# Patient Record
Sex: Male | Born: 1935 | Race: White | Hispanic: No | Marital: Married | Smoking: Current every day smoker
Health system: Southern US, Community
[De-identification: ages and names within clinical notes are randomized; demographics above are authoritative.]

## PROBLEM LIST (undated history)

## (undated) DIAGNOSIS — E785 Hyperlipidemia, unspecified: Secondary | ICD-10-CM

## (undated) DIAGNOSIS — E876 Hypokalemia: Secondary | ICD-10-CM

## (undated) DIAGNOSIS — R7302 Impaired glucose tolerance (oral): Secondary | ICD-10-CM

## (undated) DIAGNOSIS — K449 Diaphragmatic hernia without obstruction or gangrene: Secondary | ICD-10-CM

## (undated) DIAGNOSIS — K279 Peptic ulcer, site unspecified, unspecified as acute or chronic, without hemorrhage or perforation: Secondary | ICD-10-CM

## (undated) DIAGNOSIS — I2510 Atherosclerotic heart disease of native coronary artery without angina pectoris: Secondary | ICD-10-CM

## (undated) DIAGNOSIS — I7389 Other specified peripheral vascular diseases: Secondary | ICD-10-CM

## (undated) DIAGNOSIS — J441 Chronic obstructive pulmonary disease with (acute) exacerbation: Secondary | ICD-10-CM

## (undated) DIAGNOSIS — F17200 Nicotine dependence, unspecified, uncomplicated: Secondary | ICD-10-CM

## (undated) DIAGNOSIS — E739 Lactose intolerance, unspecified: Secondary | ICD-10-CM

## (undated) DIAGNOSIS — R5381 Other malaise: Secondary | ICD-10-CM

## (undated) DIAGNOSIS — I701 Atherosclerosis of renal artery: Secondary | ICD-10-CM

## (undated) DIAGNOSIS — H543 Unqualified visual loss, both eyes: Secondary | ICD-10-CM

## (undated) DIAGNOSIS — I219 Acute myocardial infarction, unspecified: Secondary | ICD-10-CM

## (undated) DIAGNOSIS — E538 Deficiency of other specified B group vitamins: Secondary | ICD-10-CM

## (undated) DIAGNOSIS — Z8679 Personal history of other diseases of the circulatory system: Secondary | ICD-10-CM

## (undated) DIAGNOSIS — H9192 Unspecified hearing loss, left ear: Principal | ICD-10-CM

## (undated) DIAGNOSIS — F329 Major depressive disorder, single episode, unspecified: Secondary | ICD-10-CM

## (undated) DIAGNOSIS — K649 Unspecified hemorrhoids: Secondary | ICD-10-CM

## (undated) DIAGNOSIS — Q2730 Arteriovenous malformation, site unspecified: Secondary | ICD-10-CM

## (undated) DIAGNOSIS — I10 Essential (primary) hypertension: Principal | ICD-10-CM

## (undated) DIAGNOSIS — R5383 Other fatigue: Secondary | ICD-10-CM

## (undated) DIAGNOSIS — J449 Chronic obstructive pulmonary disease, unspecified: Secondary | ICD-10-CM

## (undated) DIAGNOSIS — E7800 Pure hypercholesterolemia, unspecified: Secondary | ICD-10-CM

## (undated) DIAGNOSIS — I080 Rheumatic disorders of both mitral and aortic valves: Secondary | ICD-10-CM

## (undated) DIAGNOSIS — R339 Retention of urine, unspecified: Secondary | ICD-10-CM

## (undated) DIAGNOSIS — D509 Iron deficiency anemia, unspecified: Secondary | ICD-10-CM

## (undated) DIAGNOSIS — I739 Peripheral vascular disease, unspecified: Secondary | ICD-10-CM

## (undated) DIAGNOSIS — R079 Chest pain, unspecified: Secondary | ICD-10-CM

## (undated) DIAGNOSIS — K551 Chronic vascular disorders of intestine: Secondary | ICD-10-CM

## (undated) DIAGNOSIS — K219 Gastro-esophageal reflux disease without esophagitis: Secondary | ICD-10-CM

## (undated) DIAGNOSIS — I4892 Unspecified atrial flutter: Secondary | ICD-10-CM

## (undated) DIAGNOSIS — K5730 Diverticulosis of large intestine without perforation or abscess without bleeding: Secondary | ICD-10-CM

## (undated) DIAGNOSIS — M25539 Pain in unspecified wrist: Secondary | ICD-10-CM

## (undated) DIAGNOSIS — Z86010 Personal history of colonic polyps: Secondary | ICD-10-CM

## (undated) HISTORY — DX: Other fatigue: R53.83

## (undated) HISTORY — DX: Impaired glucose tolerance (oral): R73.02

## (undated) HISTORY — DX: Unspecified atrial flutter: I48.92

## (undated) HISTORY — DX: Chronic obstructive pulmonary disease, unspecified: J44.9

## (undated) HISTORY — DX: Peripheral vascular disease, unspecified: I73.9

## (undated) HISTORY — DX: Atherosclerosis of renal artery: I70.1

## (undated) HISTORY — PX: PR VEIN BYPASS GRAFT,AORTO-FEM-POP: 35551

## (undated) HISTORY — DX: Nicotine dependence, unspecified, uncomplicated: F17.200

## (undated) HISTORY — DX: Acute myocardial infarction, unspecified: I21.9

## (undated) HISTORY — DX: Essential (primary) hypertension: I10

## (undated) HISTORY — DX: Gastro-esophageal reflux disease without esophagitis: K21.9

## (undated) HISTORY — DX: Atherosclerotic heart disease of native coronary artery without angina pectoris: I25.10

## (undated) HISTORY — DX: Rheumatic disorders of both mitral and aortic valves: I08.0

## (undated) HISTORY — DX: Deficiency of other specified B group vitamins: E53.8

## (undated) HISTORY — PX: ESOPHAGOGASTRODUODENOSCOPY: SHX1529

## (undated) HISTORY — DX: Other specified peripheral vascular diseases: I73.89

## (undated) HISTORY — DX: Pure hypercholesterolemia, unspecified: E78.00

## (undated) HISTORY — DX: Unqualified visual loss, both eyes: H54.3

## (undated) HISTORY — DX: Hypokalemia: E87.6

## (undated) HISTORY — DX: Hyperlipidemia, unspecified: E78.5

## (undated) HISTORY — DX: Unspecified hemorrhoids: K64.9

## (undated) HISTORY — PX: COLONOSCOPY: SHX174

## (undated) HISTORY — DX: Unspecified hearing loss, left ear: H91.92

## (undated) HISTORY — DX: Major depressive disorder, single episode, unspecified: F32.9

## (undated) HISTORY — DX: Chronic obstructive pulmonary disease with (acute) exacerbation: J44.1

## (undated) HISTORY — PX: OTHER SURGICAL HISTORY: SHX169

## (undated) HISTORY — DX: Chronic vascular disorders of intestine: K55.1

## (undated) HISTORY — DX: Personal history of other diseases of the circulatory system: Z86.79

## (undated) HISTORY — DX: Pain in unspecified wrist: M25.539

## (undated) HISTORY — DX: Chest pain, unspecified: R07.9

## (undated) HISTORY — DX: Diverticulosis of large intestine without perforation or abscess without bleeding: K57.30

## (undated) HISTORY — PX: CARDIAC CATHETERIZATION: SHX172

## (undated) HISTORY — PX: CAROTID ENDARTERECTOMY: SUR193

## (undated) HISTORY — DX: Other malaise: R53.81

## (undated) HISTORY — DX: Personal history of colonic polyps: Z86.010

## (undated) HISTORY — DX: Peptic ulcer, site unspecified, unspecified as acute or chronic, without hemorrhage or perforation: K27.9

## (undated) HISTORY — DX: Iron deficiency anemia, unspecified: D50.9

## (undated) HISTORY — DX: Diaphragmatic hernia without obstruction or gangrene: K44.9

## (undated) HISTORY — PX: APPENDECTOMY: SHX54

## (undated) HISTORY — DX: Lactose intolerance, unspecified: E73.9

## (undated) HISTORY — DX: Retention of urine, unspecified: R33.9

---

## 1993-08-14 HISTORY — PX: CORONARY ARTERY BYPASS GRAFT: SHX141

## 1999-06-16 ENCOUNTER — Encounter: Payer: Self-pay | Admitting: *Deleted

## 1999-06-16 ENCOUNTER — Inpatient Hospital Stay (HOSPITAL_COMMUNITY): Admission: EM | Admit: 1999-06-16 | Discharge: 1999-06-23 | Payer: Self-pay | Admitting: *Deleted

## 1999-06-16 ENCOUNTER — Encounter: Payer: Self-pay | Admitting: Vascular Surgery

## 1999-06-18 ENCOUNTER — Encounter: Payer: Self-pay | Admitting: *Deleted

## 1999-06-19 ENCOUNTER — Encounter: Payer: Self-pay | Admitting: Vascular Surgery

## 1999-06-20 ENCOUNTER — Encounter: Payer: Self-pay | Admitting: *Deleted

## 2000-10-28 ENCOUNTER — Emergency Department (HOSPITAL_COMMUNITY): Admission: EM | Admit: 2000-10-28 | Discharge: 2000-10-28 | Payer: Self-pay | Admitting: Emergency Medicine

## 2002-08-14 HISTORY — PX: OTHER SURGICAL HISTORY: SHX169

## 2003-02-10 ENCOUNTER — Inpatient Hospital Stay (HOSPITAL_COMMUNITY): Admission: EM | Admit: 2003-02-10 | Discharge: 2003-02-18 | Payer: Self-pay | Admitting: Emergency Medicine

## 2003-02-10 ENCOUNTER — Encounter: Payer: Self-pay | Admitting: Emergency Medicine

## 2003-02-11 ENCOUNTER — Encounter: Payer: Self-pay | Admitting: Cardiovascular Disease

## 2003-02-25 ENCOUNTER — Inpatient Hospital Stay (HOSPITAL_COMMUNITY): Admission: AD | Admit: 2003-02-25 | Discharge: 2003-02-27 | Payer: Self-pay | Admitting: *Deleted

## 2003-03-23 ENCOUNTER — Inpatient Hospital Stay (HOSPITAL_COMMUNITY): Admission: AD | Admit: 2003-03-23 | Discharge: 2003-03-26 | Payer: Self-pay | Admitting: *Deleted

## 2003-04-04 ENCOUNTER — Emergency Department (HOSPITAL_COMMUNITY): Admission: EM | Admit: 2003-04-04 | Discharge: 2003-04-04 | Payer: Self-pay | Admitting: Emergency Medicine

## 2003-06-01 ENCOUNTER — Inpatient Hospital Stay (HOSPITAL_COMMUNITY): Admission: AD | Admit: 2003-06-01 | Discharge: 2003-06-02 | Payer: Self-pay | Admitting: *Deleted

## 2003-06-01 ENCOUNTER — Encounter: Payer: Self-pay | Admitting: *Deleted

## 2003-07-14 ENCOUNTER — Inpatient Hospital Stay (HOSPITAL_COMMUNITY): Admission: RE | Admit: 2003-07-14 | Discharge: 2003-07-15 | Payer: Self-pay | Admitting: *Deleted

## 2003-07-14 ENCOUNTER — Encounter (INDEPENDENT_AMBULATORY_CARE_PROVIDER_SITE_OTHER): Payer: Self-pay | Admitting: Specialist

## 2003-07-20 ENCOUNTER — Inpatient Hospital Stay (HOSPITAL_COMMUNITY): Admission: EM | Admit: 2003-07-20 | Discharge: 2003-07-22 | Payer: Self-pay | Admitting: Emergency Medicine

## 2004-02-08 ENCOUNTER — Emergency Department (HOSPITAL_COMMUNITY): Admission: EM | Admit: 2004-02-08 | Discharge: 2004-02-08 | Payer: Self-pay | Admitting: Emergency Medicine

## 2004-05-24 ENCOUNTER — Inpatient Hospital Stay (HOSPITAL_COMMUNITY): Admission: RE | Admit: 2004-05-24 | Discharge: 2004-05-25 | Payer: Self-pay | Admitting: *Deleted

## 2004-05-24 ENCOUNTER — Encounter (INDEPENDENT_AMBULATORY_CARE_PROVIDER_SITE_OTHER): Payer: Self-pay | Admitting: *Deleted

## 2004-05-26 ENCOUNTER — Inpatient Hospital Stay (HOSPITAL_COMMUNITY): Admission: EM | Admit: 2004-05-26 | Discharge: 2004-05-28 | Payer: Self-pay | Admitting: Emergency Medicine

## 2004-10-19 ENCOUNTER — Ambulatory Visit: Payer: Self-pay | Admitting: Internal Medicine

## 2004-11-03 IMAGING — CR DG CHEST 1V PORT
1 series · 1 of 1 positions shown · non-contrast
Comparison: 07/20/03.

CLINICAL DATA: Chest pain. 
 PORTABLE SINGLE VIEW CHEST ([DATE] HOURS)

[view not recorded]
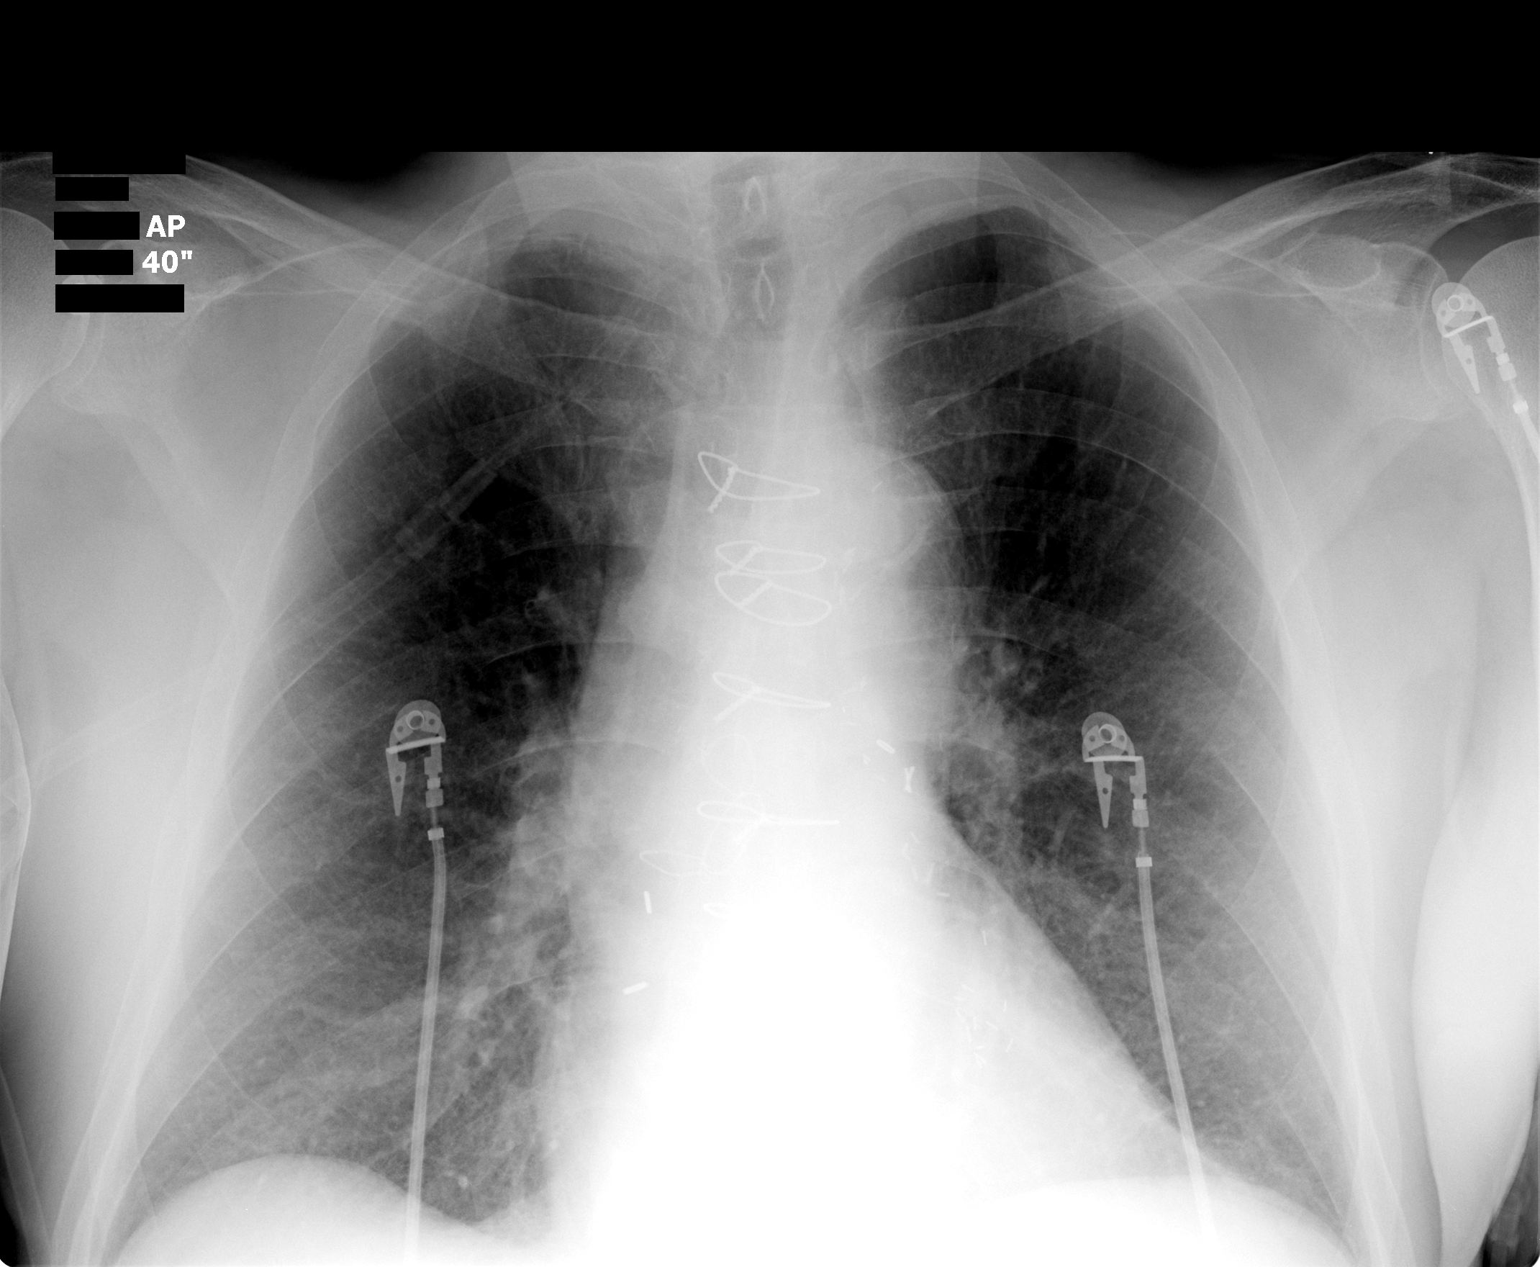

[1 of 1 positions shown; findings below may reference images not displayed]

No evidence of edema or infiltrate.  Normal heart size.  Patient has had prior CABG.
 IMPRESSION
 No active disease.

## 2005-02-13 IMAGING — CR DG CHEST 2V
2 series · 2 of 2 positions shown · non-contrast
Comparison: none

CLINICAL DATA: Carotid stenosis, pre-op. 
 TWO VIEW CHEST ? 05/20/04 ([DATE] hours)

[view not recorded (1 of 2)]
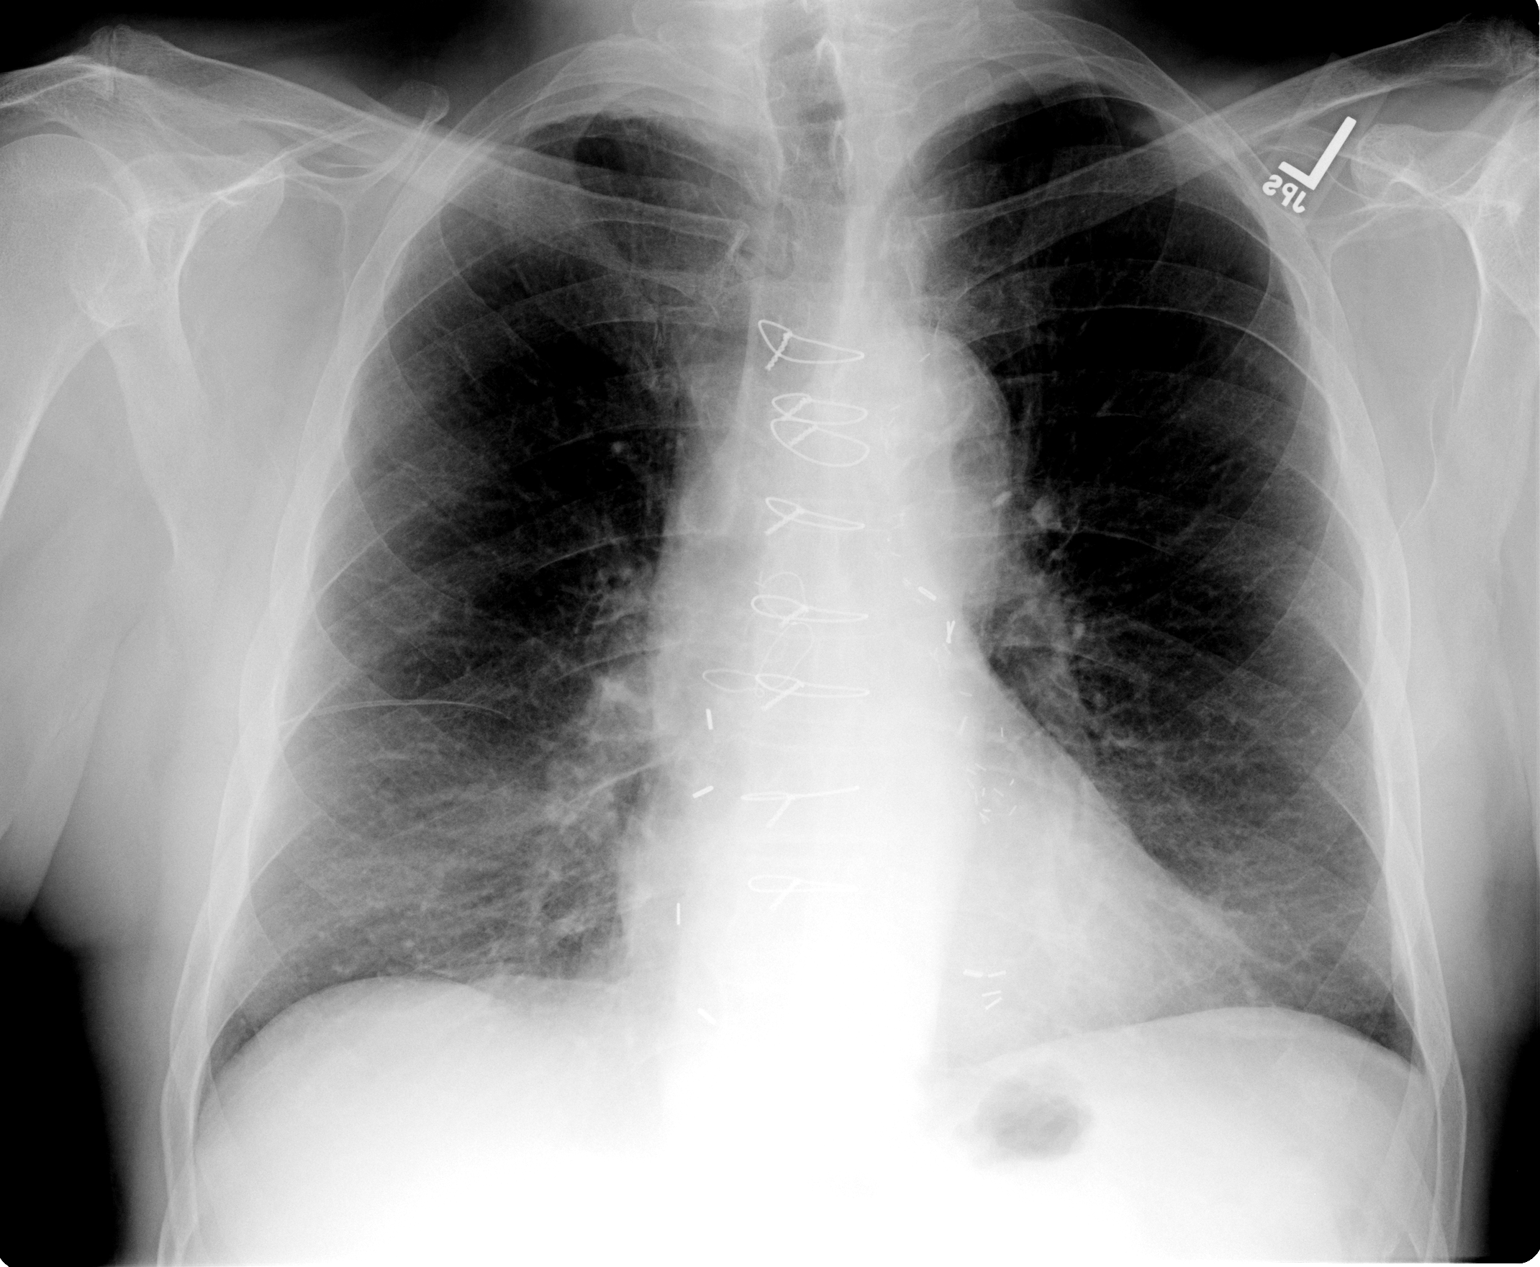

[view not recorded (2 of 2)]
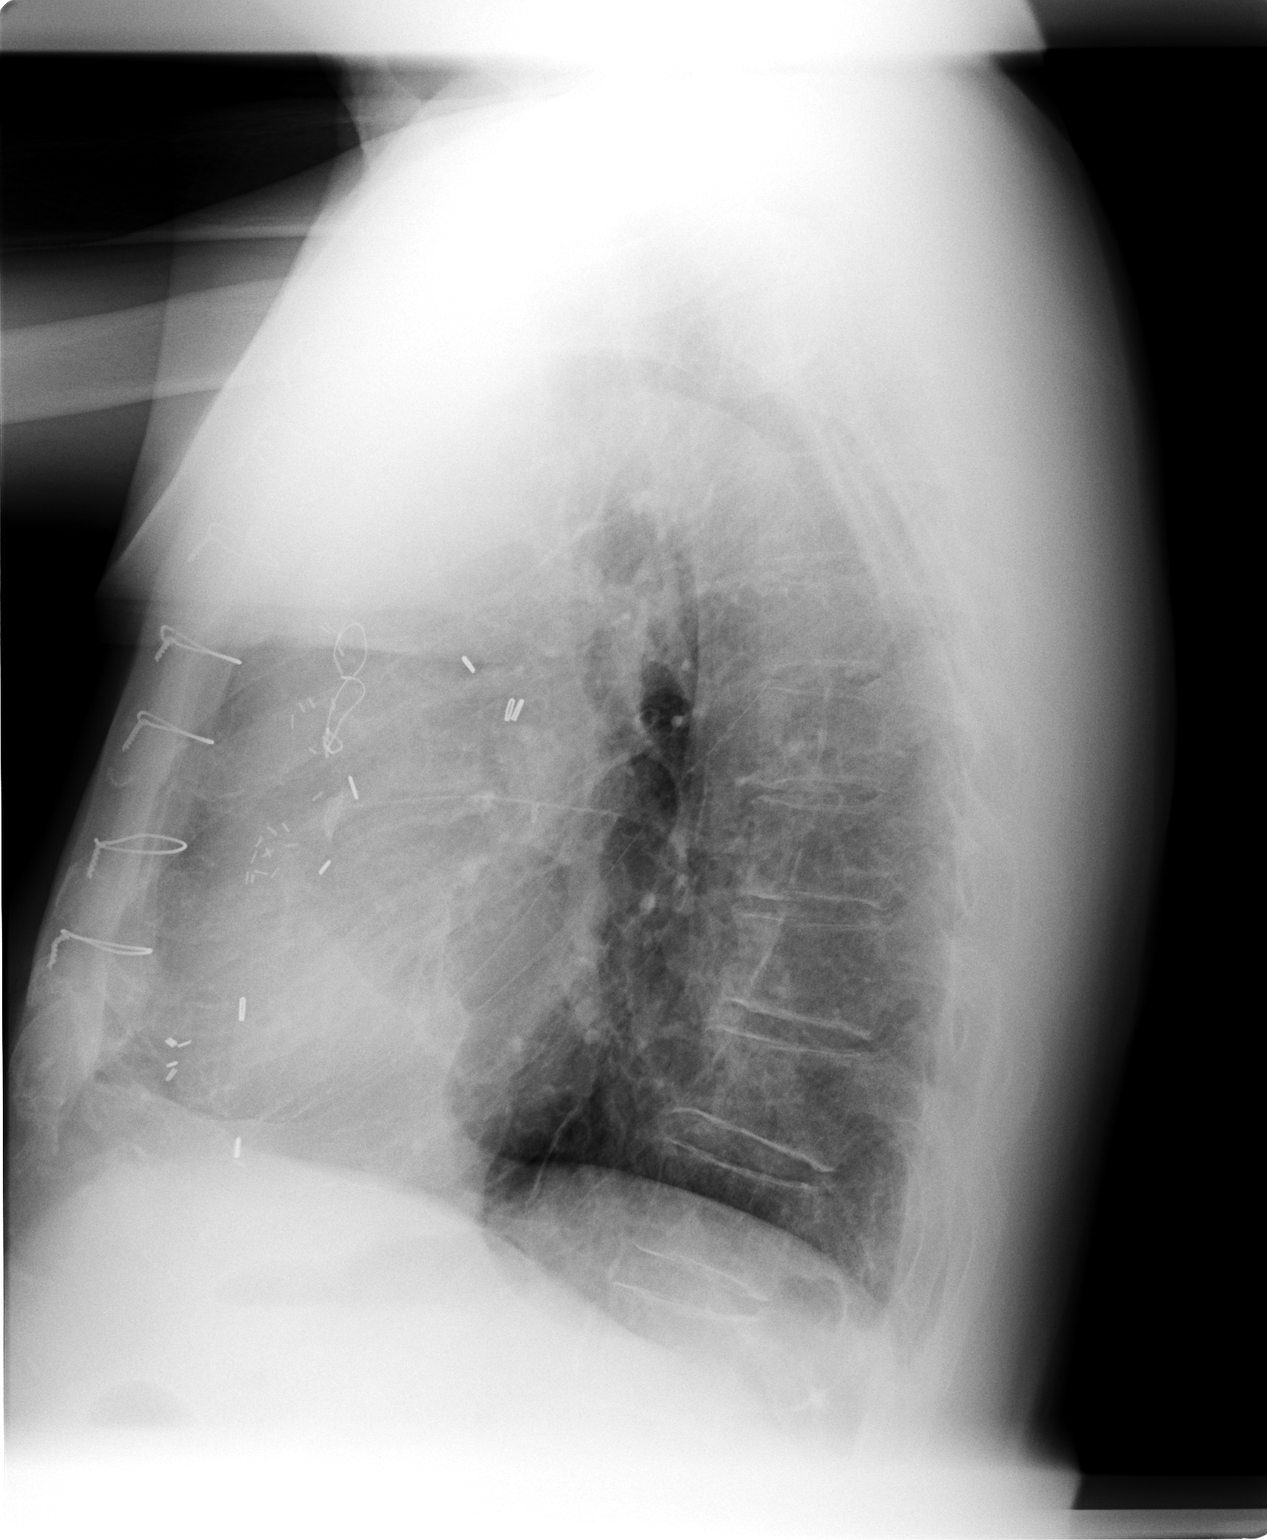

[2 of 2 positions shown; findings below may reference images not displayed]

FINDINGS: The heart is upper normal in size. The aortic arch is mildly tortuous.  Interstitial prominence is likely chronic in nature. Mild anterior wedging of estimated level T11 is noted, most likely compression fracture of indeterminant age.  Post-surgical changes are noted.     No focal masses or areas of consolidation are seen. Apical pleural thickening is symmetrical bilaterally and once again likely chronic in nature. 
 IMPRESSION 
 Mild anterior wedging of a lower thoracic vertebral body, compatible with compression of indeterminant age. 
 Chronic pulmonary and pleural changes. 
 Otherwise, no evidence of acute cardiopulmonary disease.

## 2005-02-19 IMAGING — CR DG CHEST 1V PORT
1 series · 1 of 1 positions shown · non-contrast
Comparison: none

HISTORY: Chest pain

[view not recorded]
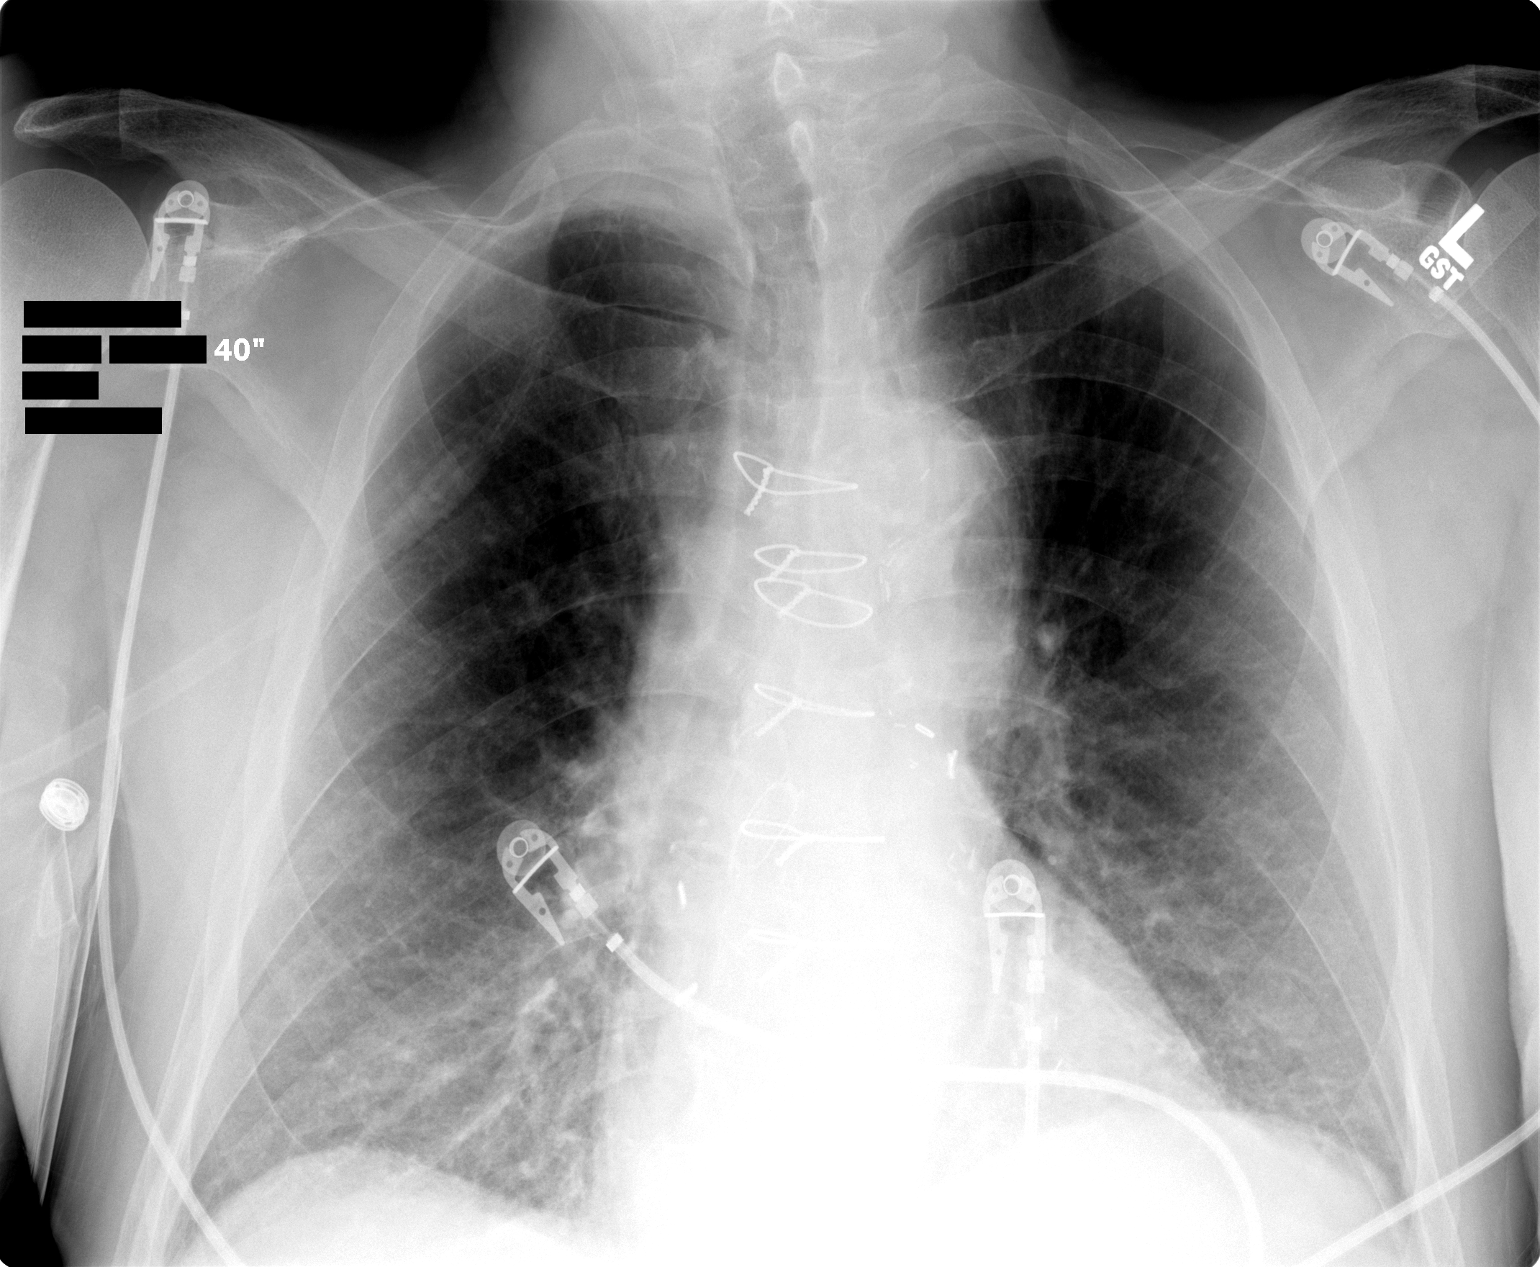

[1 of 1 positions shown; findings below may reference images not displayed]

PORTABLE CHEST ONE VIEW

Portable exam 5675 hours compared to 05/20/2004.

Normal heart size status post CABG.
Calcified tortuous aorta.
Vascularity normal.
Mild chronic bronchitic changes with mild diffuse interstitial prominence, also likely chronic.
Minimal left base atelectasis.
No acute failure or consolidation.
IMPRESSION: Mild chronic bronchitic and interstitial changes. 
Minimal left base atelectasis.

## 2005-10-16 ENCOUNTER — Inpatient Hospital Stay (HOSPITAL_COMMUNITY): Admission: EM | Admit: 2005-10-16 | Discharge: 2005-10-20 | Payer: Self-pay | Admitting: Emergency Medicine

## 2005-10-16 ENCOUNTER — Ambulatory Visit: Payer: Self-pay | Admitting: Internal Medicine

## 2005-10-16 ENCOUNTER — Ambulatory Visit: Payer: Self-pay | Admitting: Pulmonary Disease

## 2005-10-16 ENCOUNTER — Ambulatory Visit: Payer: Self-pay | Admitting: Cardiology

## 2005-10-23 ENCOUNTER — Inpatient Hospital Stay (HOSPITAL_COMMUNITY): Admission: RE | Admit: 2005-10-23 | Discharge: 2005-10-25 | Payer: Self-pay | Admitting: *Deleted

## 2005-11-01 ENCOUNTER — Inpatient Hospital Stay (HOSPITAL_COMMUNITY): Admission: EM | Admit: 2005-11-01 | Discharge: 2005-11-02 | Payer: Self-pay | Admitting: Emergency Medicine

## 2005-11-01 ENCOUNTER — Ambulatory Visit: Payer: Self-pay | Admitting: Cardiology

## 2005-12-26 ENCOUNTER — Ambulatory Visit: Payer: Self-pay | Admitting: Internal Medicine

## 2006-07-12 IMAGING — CR DG CHEST 1V PORT SAME DAY
1 series · 1 of 1 positions shown · non-contrast
Comparison: 10/16/05.

CLINICAL DATA: Hypotension.  Central line placement. 
 PORTABLE CHEST ? 1 VIEW (SAME DAY) ? 10/16/05 AT 6426 HOURS:

[view not recorded]
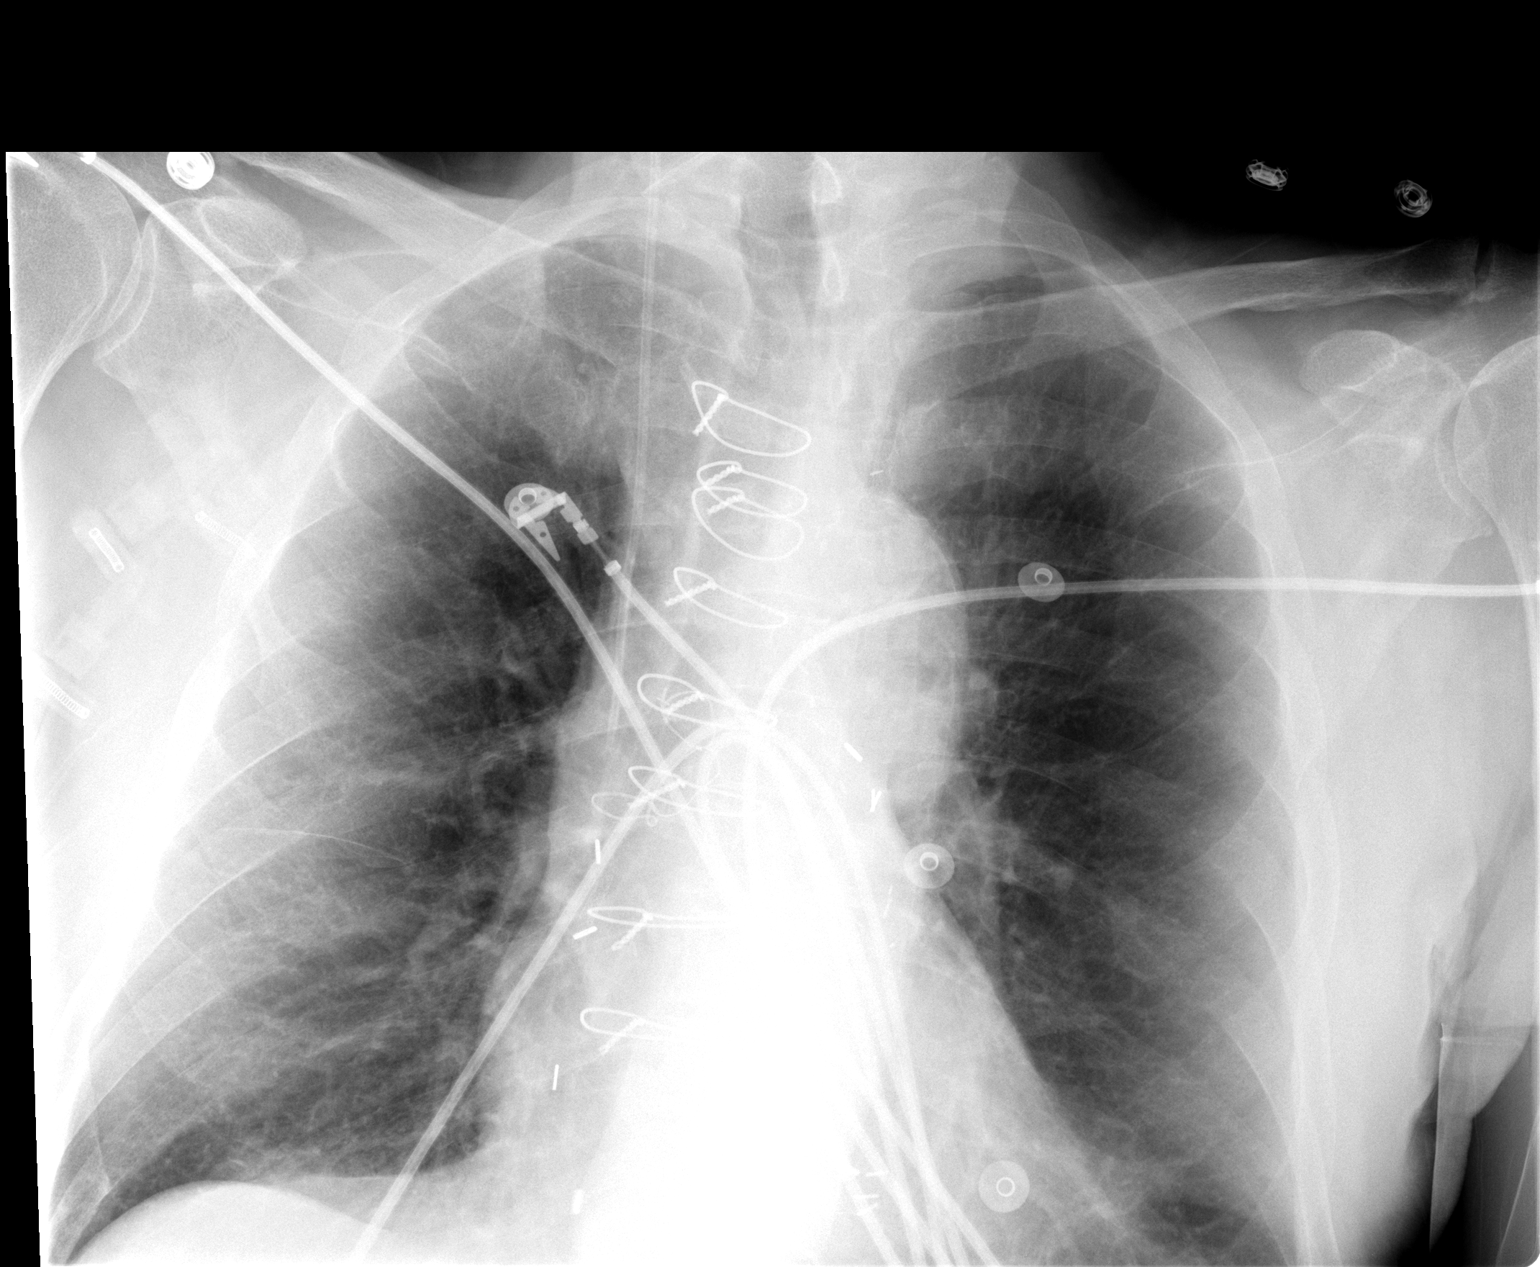

[1 of 1 positions shown; findings below may reference images not displayed]

FINDINGS: CVC has been placed inserted via right jugular vein approach.  The tip is in the SVC.  No pneumothorax.  Lungs are hyperaerated and clear.  Extreme left lung base is excluded from the film.
IMPRESSION: Specifically, CVC tip is in the SVC.  No pneumothorax.

## 2006-07-12 IMAGING — CR DG CHEST 1V PORT
1 series · 1 of 1 positions shown · non-contrast
Comparison: none

CLINICAL DATA: Flu symptoms for one week.  
 PORTABLE CHEST - 1 VIEW:
 An AP semierect portable film of the chest made 10/16/2005 at 9066 hours is compared to a previous study of 05/26/2004 at 8158 hours and shows some interval improvement in overall aeration of the lungs.  There remains diffuse peribronchial thickening throughout both lungs.  There have been previous coronary artery bypass grafts.  No definite edema, effusion, or pneumothorax is seen.  There is a vague area of increased density associated with the region of the left lingula that could represent an area of atelectasis or early infiltrate.  
 The bony thorax shows no definite metastatic disease but there are wire sutures in the sternum.

[view not recorded]
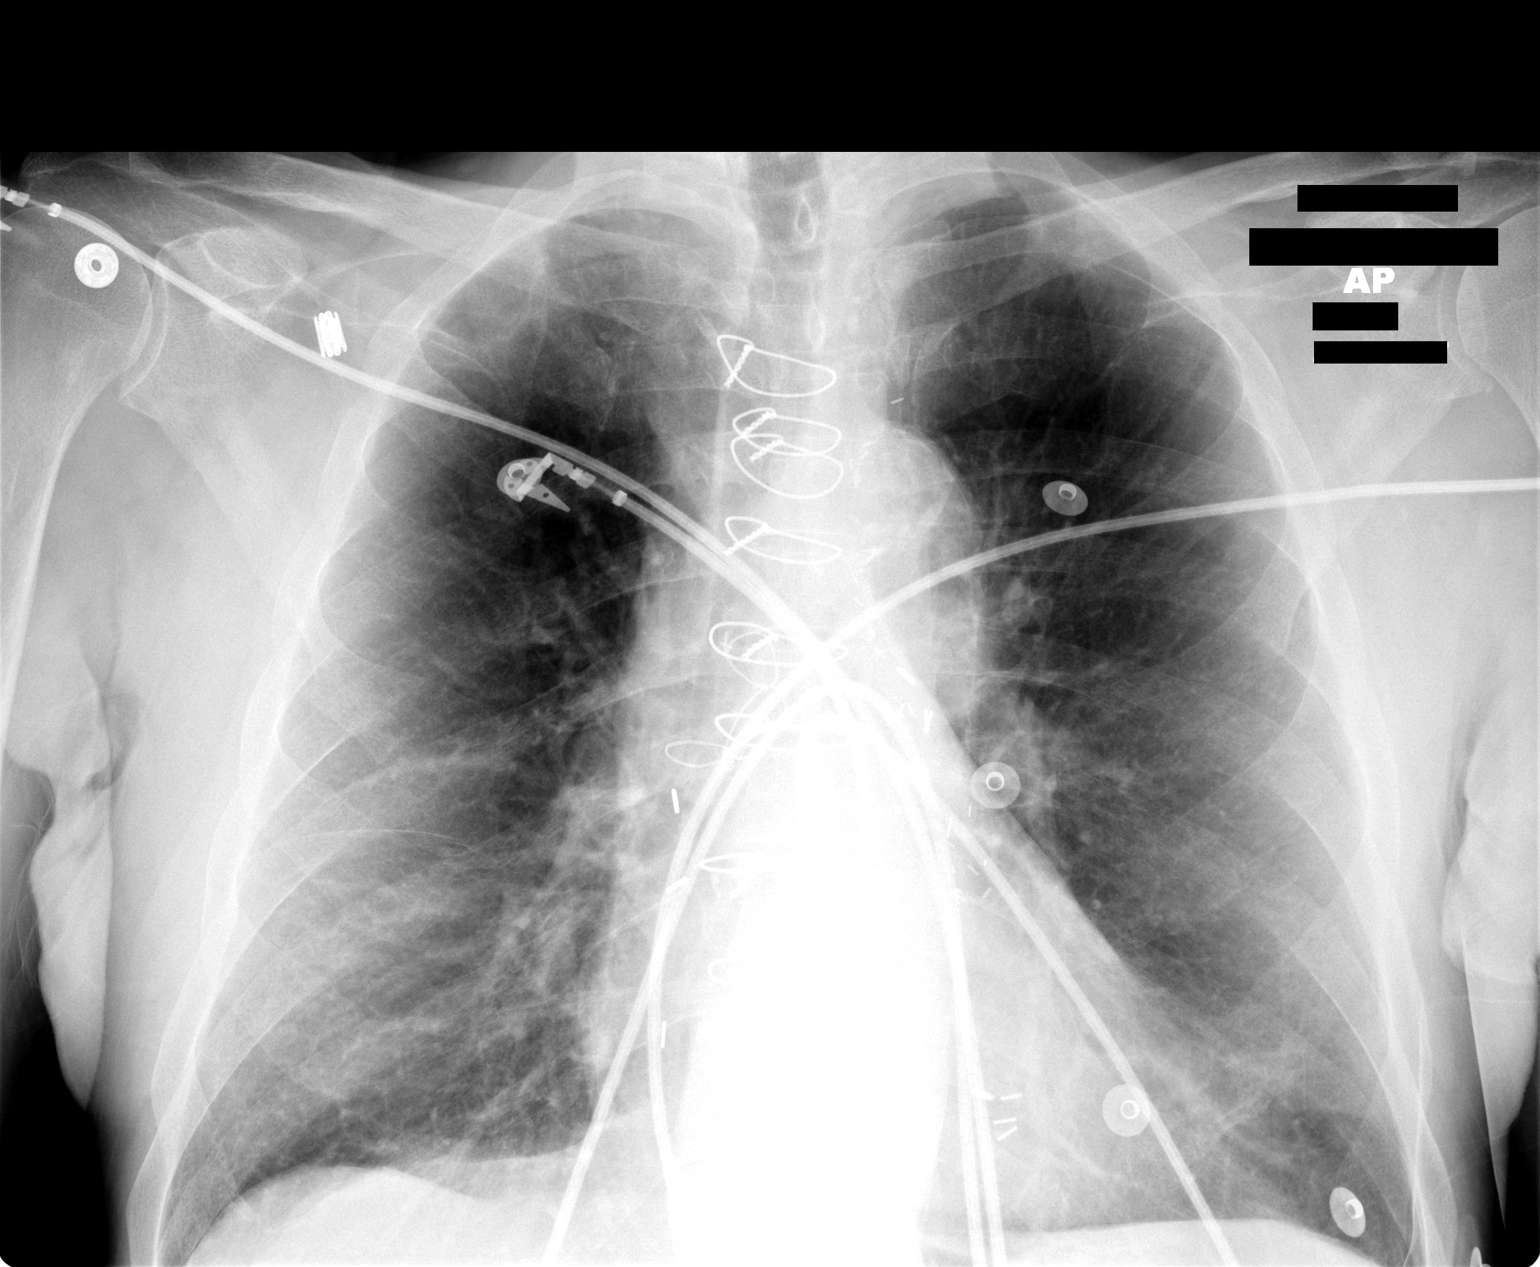

[1 of 1 positions shown; findings below may reference images not displayed]

IMPRESSION: Diffuse peribronchial thickening with an area of vague increased density in the region of the left lingula which could be an area of atelectasis or early infiltrate without consolidation.  No definite cardiomegaly or edema.

## 2006-07-13 IMAGING — US US RENAL
1 series · 14 of 25 positions shown · non-contrast
Comparison: none

CLINICAL DATA: Elevated BUN and creatinine.  Bilateral renal arterial stents.  Hypertension.
 RENAL ULTRASOUND:
TECHNIQUE: Complete ultrasound examination of the urinary tract was performed including evaluation of the kidneys, renal collecting systems, and urinary bladder.
 Both kidneys measure 12cm in length.  No mass or hydronephrosis.  Slightly diffuse increase in renal parenchymal echogenicity probably mainly cortical in location as the medullary pyramids stand out as relatively hypoechoic structures.  
 Bladder not optimally evaluated because of presence of Foley catheter.  Sludge in the gallbladder.

[Series 1: unknown · 0.30mm/px · 14 of 35 slices shown]
[im 1/35]
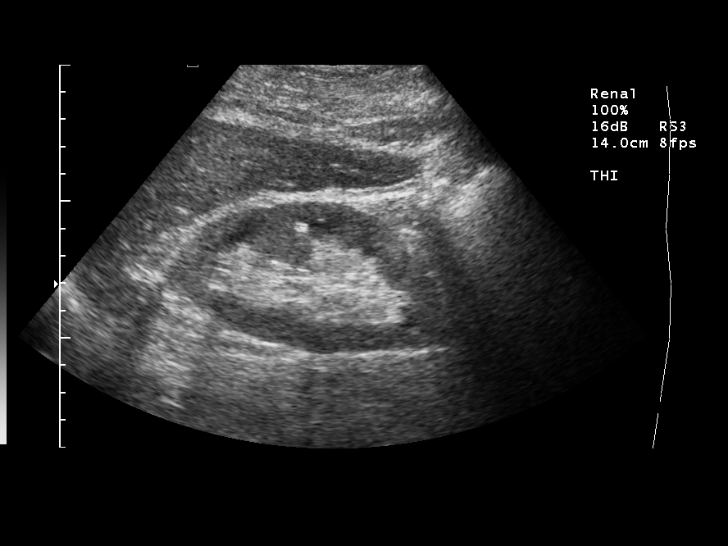
[im 3/35]
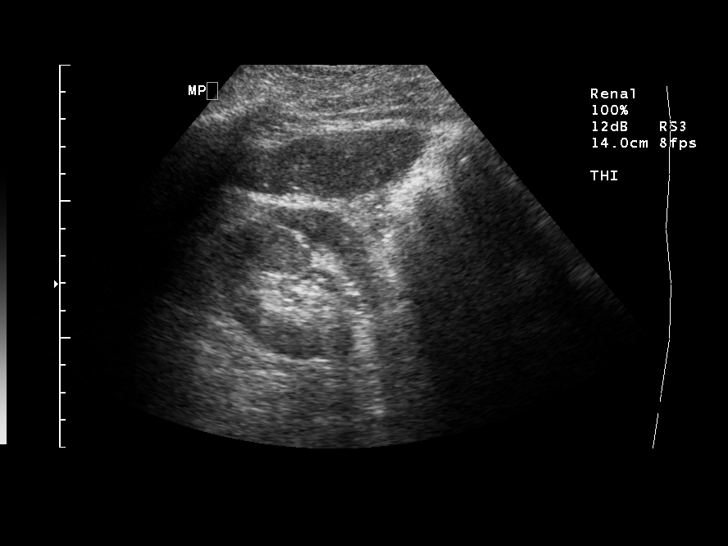
[im 6/35]
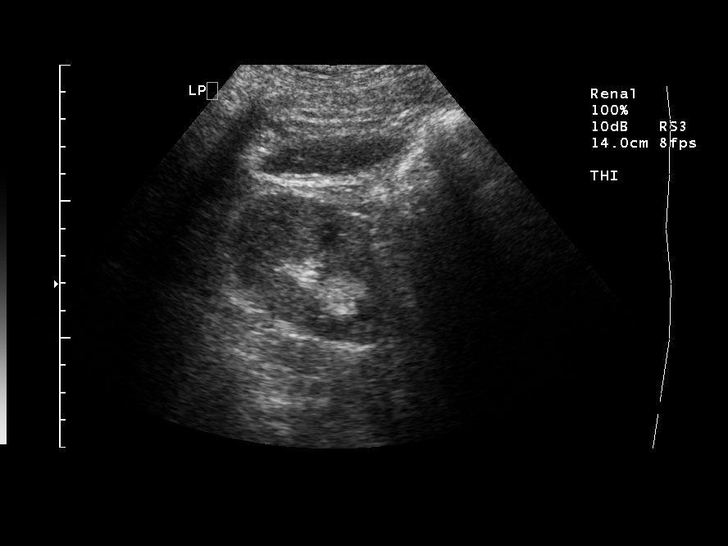
[im 9/35]
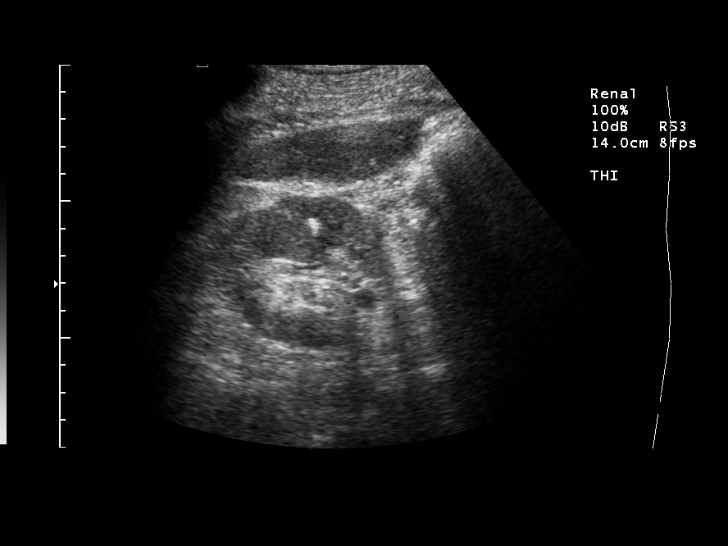
[im 12/35]
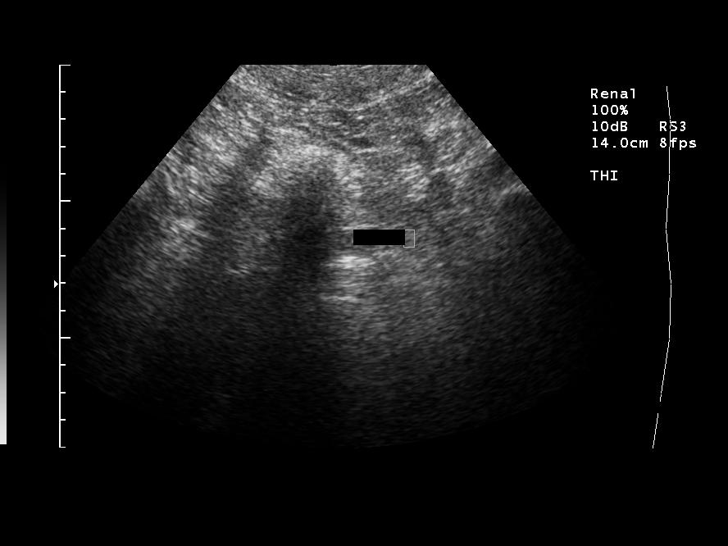
[im 13/35]
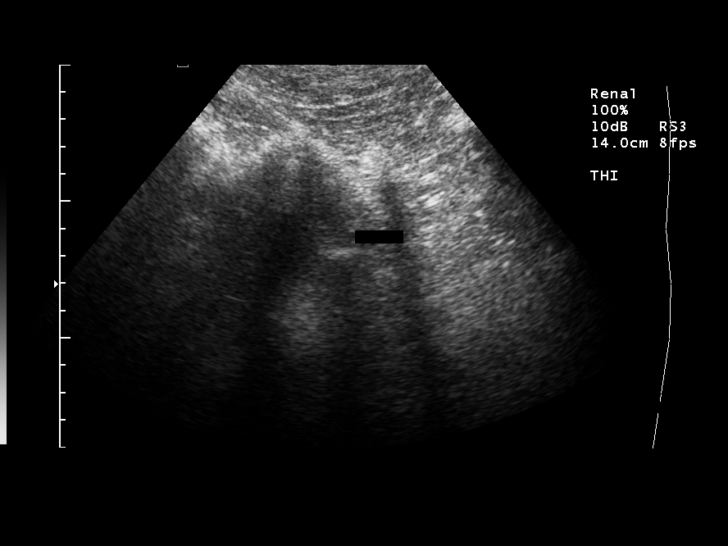
[im 16/35]
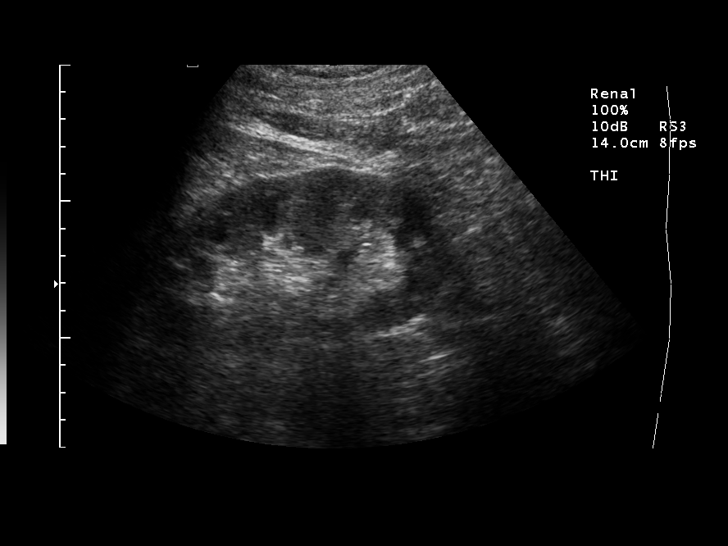
[im 19/35]
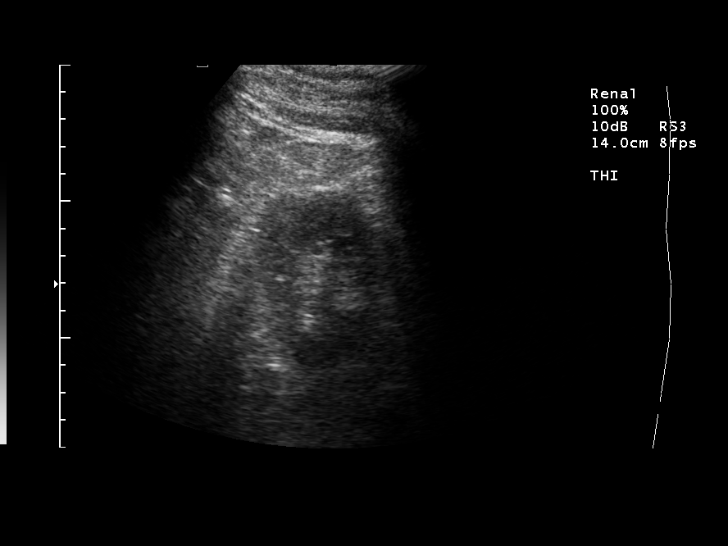
[im 22/35]
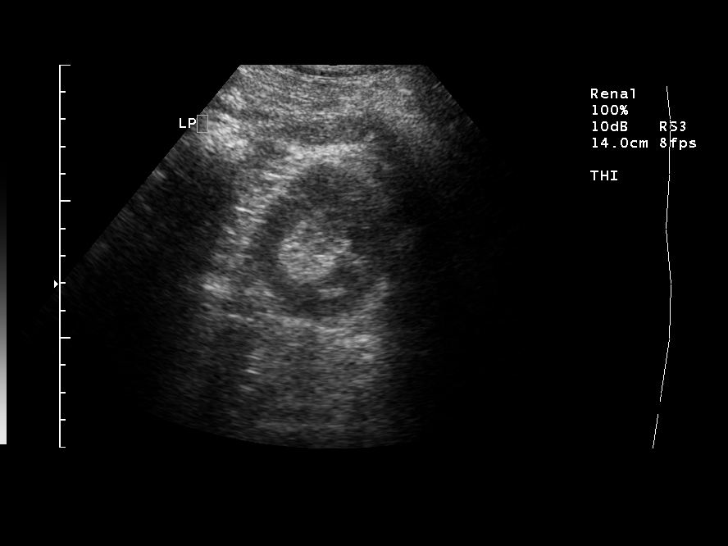
[im 23/35]
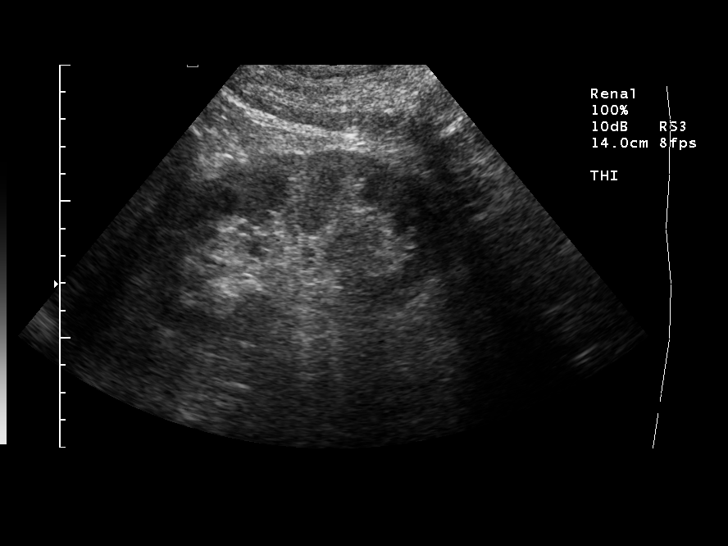
[im 26/35]
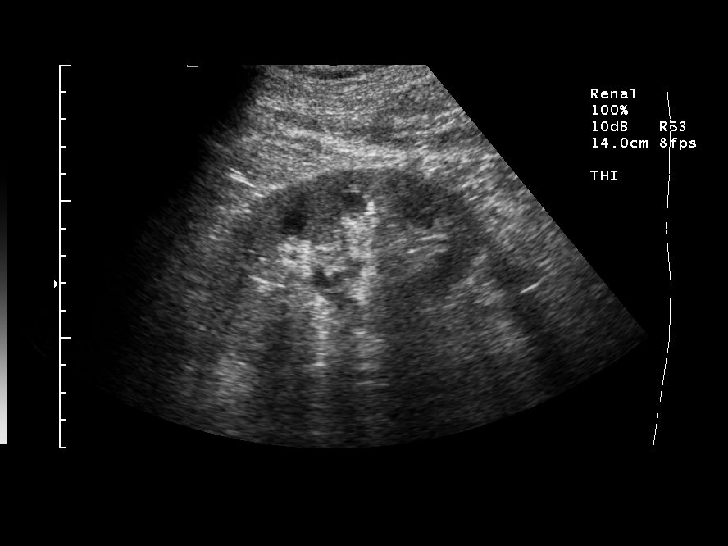
[im 29/35]
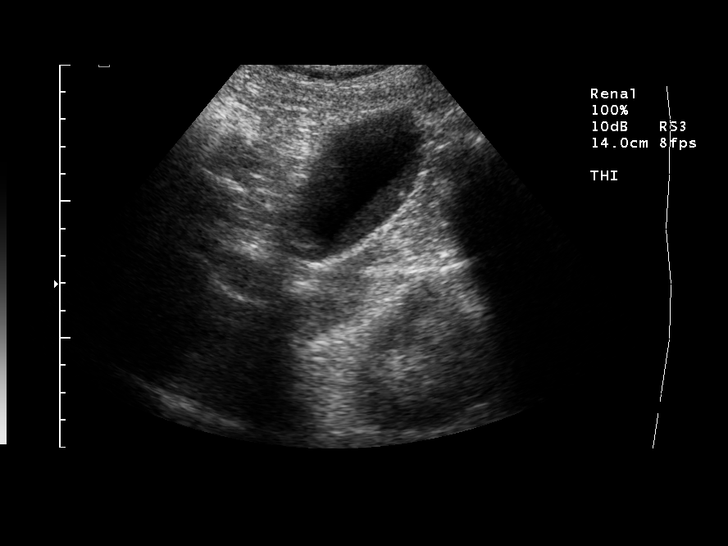
[im 32/35]
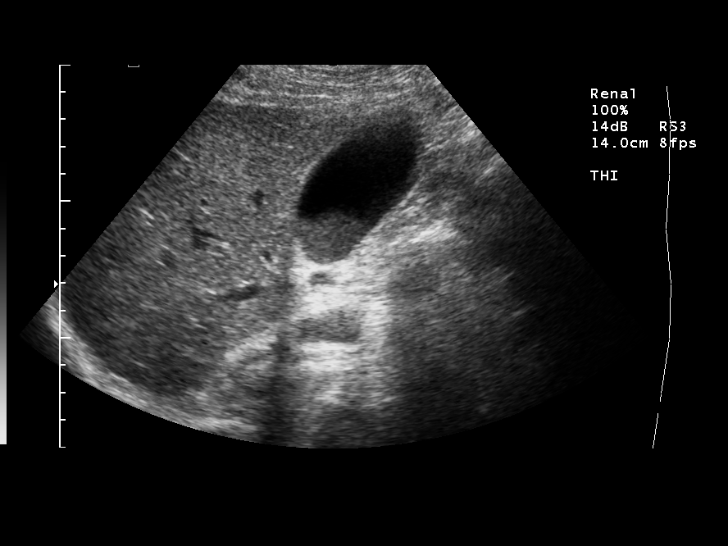
[im 35/35]
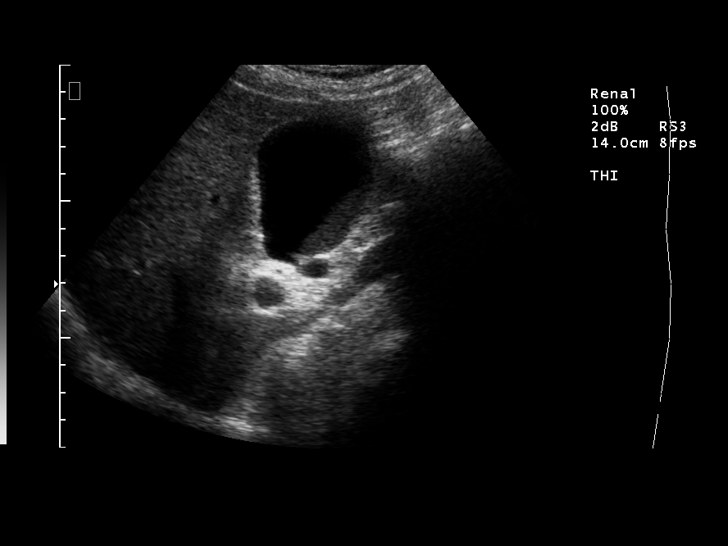

[14 of 25 positions shown; findings below may reference images not displayed]

IMPRESSION: Findings compatible with nonspecific renal medical disease.  No hydronephrosis.

## 2006-07-28 IMAGING — CR DG CHEST 1V PORT
1 series · 1 of 1 positions shown · non-contrast
Comparison: 10/23/2005.

CLINICAL DATA: Chest pain. Hypertension, smoking history.  
 PORTABLE CHEST - 1 VIEW AT 5964 HOURS:

[view not recorded]
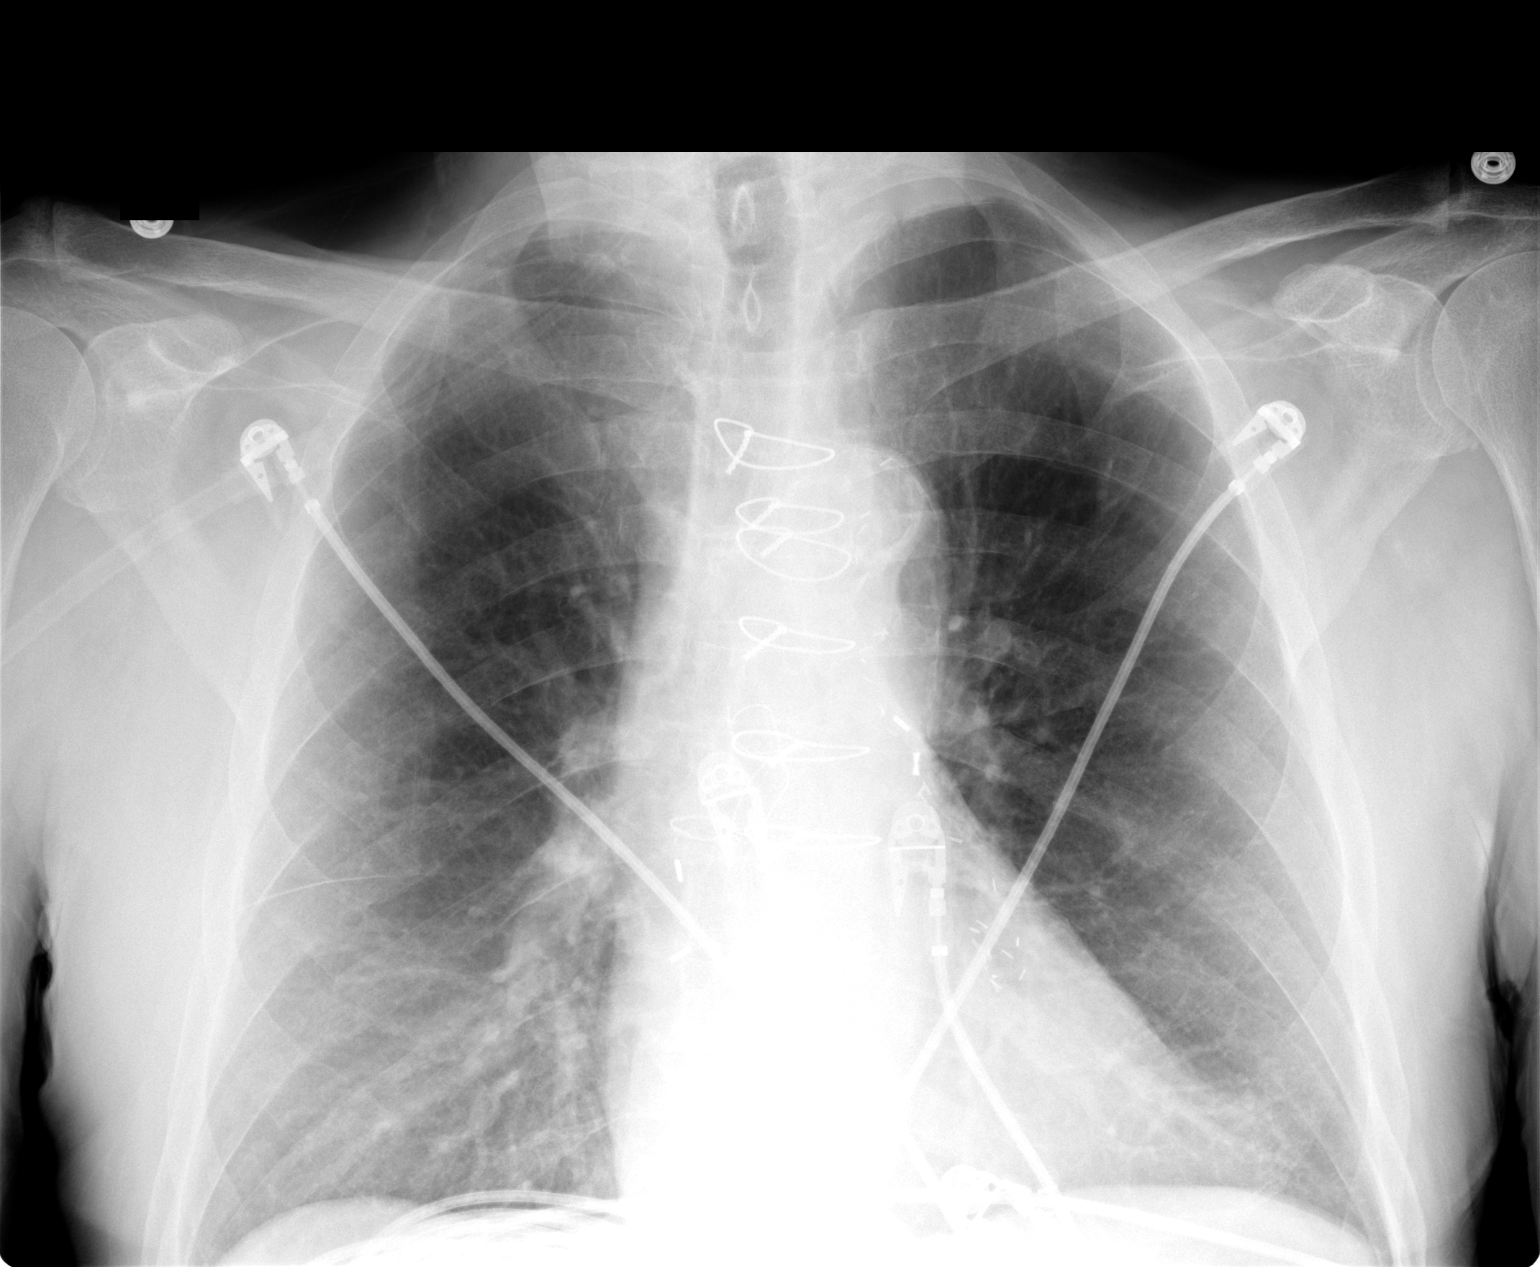

[1 of 1 positions shown; findings below may reference images not displayed]

The patient has had previous median sternotomy and coronary artery bypass grafting.  Artifact overlies the chest.  There may be mild venous hypertension but there is no frank edema.  No effusions.  No focal pulmonary lesions.
IMPRESSION: Question pulmonary venous hypertension, but no frank edema.

## 2007-02-27 ENCOUNTER — Ambulatory Visit: Payer: Self-pay | Admitting: Internal Medicine

## 2007-02-27 LAB — CONVERTED CEMR LAB
AST: 19 units/L (ref 0–37)
Basophils Relative: 1 % (ref 0.0–1.0)
Bilirubin, Direct: 0.1 mg/dL (ref 0.0–0.3)
CO2: 31 meq/L (ref 19–32)
Chloride: 109 meq/L (ref 96–112)
Eosinophils Absolute: 0.6 10*3/uL (ref 0.0–0.6)
Eosinophils Relative: 7.2 % — ABNORMAL HIGH (ref 0.0–5.0)
GFR calc non Af Amer: 58 mL/min
Glucose, Bld: 94 mg/dL (ref 70–99)
HCT: 33.4 % — ABNORMAL LOW (ref 39.0–52.0)
Hgb A1c MFr Bld: 6.3 % — ABNORMAL HIGH (ref 4.6–6.0)
Lymphocytes Relative: 30.3 % (ref 12.0–46.0)
MCV: 90.2 fL (ref 78.0–100.0)
Neutrophils Relative %: 53.1 % (ref 43.0–77.0)
RBC: 3.71 M/uL — ABNORMAL LOW (ref 4.22–5.81)
Sodium: 145 meq/L (ref 135–145)
Total Bilirubin: 0.7 mg/dL (ref 0.3–1.2)
Total CHOL/HDL Ratio: 2.6
Total Protein: 6.6 g/dL (ref 6.0–8.3)
WBC: 7.9 10*3/uL (ref 4.5–10.5)

## 2007-03-02 DIAGNOSIS — Z8679 Personal history of other diseases of the circulatory system: Secondary | ICD-10-CM | POA: Insufficient documentation

## 2007-03-02 DIAGNOSIS — I2510 Atherosclerotic heart disease of native coronary artery without angina pectoris: Secondary | ICD-10-CM | POA: Insufficient documentation

## 2007-03-02 DIAGNOSIS — J4489 Other specified chronic obstructive pulmonary disease: Secondary | ICD-10-CM | POA: Insufficient documentation

## 2007-03-02 DIAGNOSIS — K219 Gastro-esophageal reflux disease without esophagitis: Secondary | ICD-10-CM

## 2007-03-02 DIAGNOSIS — J449 Chronic obstructive pulmonary disease, unspecified: Secondary | ICD-10-CM

## 2007-03-02 DIAGNOSIS — I7389 Other specified peripheral vascular diseases: Secondary | ICD-10-CM | POA: Insufficient documentation

## 2007-03-02 DIAGNOSIS — I701 Atherosclerosis of renal artery: Secondary | ICD-10-CM | POA: Insufficient documentation

## 2007-03-02 HISTORY — DX: Atherosclerotic heart disease of native coronary artery without angina pectoris: I25.10

## 2007-03-02 HISTORY — DX: Other specified chronic obstructive pulmonary disease: J44.89

## 2007-03-02 HISTORY — DX: Personal history of other diseases of the circulatory system: Z86.79

## 2007-03-02 HISTORY — DX: Other specified peripheral vascular diseases: I73.89

## 2007-03-02 HISTORY — DX: Gastro-esophageal reflux disease without esophagitis: K21.9

## 2007-03-02 HISTORY — DX: Chronic obstructive pulmonary disease, unspecified: J44.9

## 2007-03-02 HISTORY — DX: Atherosclerosis of renal artery: I70.1

## 2007-05-31 ENCOUNTER — Ambulatory Visit: Payer: Self-pay | Admitting: Internal Medicine

## 2007-06-07 ENCOUNTER — Telehealth (INDEPENDENT_AMBULATORY_CARE_PROVIDER_SITE_OTHER): Payer: Self-pay | Admitting: *Deleted

## 2007-06-17 ENCOUNTER — Ambulatory Visit: Payer: Self-pay | Admitting: Internal Medicine

## 2007-06-17 LAB — CONVERTED CEMR LAB
BUN: 9 mg/dL (ref 6–23)
Basophils Absolute: 0.1 10*3/uL (ref 0.0–0.1)
Calcium: 9.6 mg/dL (ref 8.4–10.5)
Chloride: 102 meq/L (ref 96–112)
Eosinophils Absolute: 0.2 10*3/uL (ref 0.0–0.6)
GFR calc Af Amer: 85 mL/min
GFR calc non Af Amer: 70 mL/min
Lymphocytes Relative: 25.3 % (ref 12.0–46.0)
MCHC: 34 g/dL (ref 30.0–36.0)
MCV: 90.4 fL (ref 78.0–100.0)
Monocytes Relative: 6.5 % (ref 3.0–11.0)
Neutro Abs: 4.5 10*3/uL (ref 1.4–7.7)
Platelets: 190 10*3/uL (ref 150–400)
RBC: 2.85 M/uL — ABNORMAL LOW (ref 4.22–5.81)
WBC: 7.1 10*3/uL (ref 4.5–10.5)

## 2007-06-18 ENCOUNTER — Ambulatory Visit: Payer: Self-pay | Admitting: Cardiology

## 2007-06-18 ENCOUNTER — Encounter: Payer: Self-pay | Admitting: Internal Medicine

## 2007-06-18 ENCOUNTER — Inpatient Hospital Stay (HOSPITAL_COMMUNITY): Admission: RE | Admit: 2007-06-18 | Discharge: 2007-06-26 | Payer: Self-pay | Admitting: Internal Medicine

## 2007-06-19 ENCOUNTER — Ambulatory Visit: Payer: Self-pay | Admitting: Vascular Surgery

## 2007-06-21 ENCOUNTER — Ambulatory Visit: Payer: Self-pay | Admitting: Internal Medicine

## 2007-06-21 ENCOUNTER — Encounter: Payer: Self-pay | Admitting: Internal Medicine

## 2007-06-24 ENCOUNTER — Encounter: Payer: Self-pay | Admitting: Internal Medicine

## 2007-06-26 ENCOUNTER — Encounter: Payer: Self-pay | Admitting: Gastroenterology

## 2007-07-18 ENCOUNTER — Ambulatory Visit: Payer: Self-pay | Admitting: *Deleted

## 2007-07-18 ENCOUNTER — Encounter: Payer: Self-pay | Admitting: Internal Medicine

## 2007-09-17 ENCOUNTER — Encounter: Payer: Self-pay | Admitting: Internal Medicine

## 2007-09-23 ENCOUNTER — Ambulatory Visit: Payer: Self-pay | Admitting: Internal Medicine

## 2007-09-23 DIAGNOSIS — F411 Generalized anxiety disorder: Secondary | ICD-10-CM | POA: Insufficient documentation

## 2007-09-23 DIAGNOSIS — D509 Iron deficiency anemia, unspecified: Secondary | ICD-10-CM

## 2007-09-23 DIAGNOSIS — I739 Peripheral vascular disease, unspecified: Secondary | ICD-10-CM

## 2007-09-23 DIAGNOSIS — F329 Major depressive disorder, single episode, unspecified: Secondary | ICD-10-CM

## 2007-09-23 DIAGNOSIS — K5730 Diverticulosis of large intestine without perforation or abscess without bleeding: Secondary | ICD-10-CM | POA: Insufficient documentation

## 2007-09-23 DIAGNOSIS — I10 Essential (primary) hypertension: Secondary | ICD-10-CM | POA: Insufficient documentation

## 2007-09-23 DIAGNOSIS — E739 Lactose intolerance, unspecified: Secondary | ICD-10-CM | POA: Insufficient documentation

## 2007-09-23 DIAGNOSIS — F3289 Other specified depressive episodes: Secondary | ICD-10-CM

## 2007-09-23 HISTORY — DX: Peripheral vascular disease, unspecified: I73.9

## 2007-09-23 HISTORY — DX: Essential (primary) hypertension: I10

## 2007-09-23 HISTORY — DX: Diverticulosis of large intestine without perforation or abscess without bleeding: K57.30

## 2007-09-23 HISTORY — DX: Other specified depressive episodes: F32.89

## 2007-09-23 HISTORY — DX: Lactose intolerance, unspecified: E73.9

## 2007-09-23 HISTORY — DX: Major depressive disorder, single episode, unspecified: F32.9

## 2007-09-23 LAB — CONVERTED CEMR LAB
BUN: 10 mg/dL (ref 6–23)
CO2: 31 meq/L (ref 19–32)
Cholesterol: 120 mg/dL (ref 0–200)
Creatinine, Ser: 1.1 mg/dL (ref 0.4–1.5)
GFR calc Af Amer: 85 mL/min
Glucose, Bld: 105 mg/dL — ABNORMAL HIGH (ref 70–99)
Potassium: 3.9 meq/L (ref 3.5–5.1)
Total CHOL/HDL Ratio: 3.2
Triglycerides: 77 mg/dL (ref 0–149)

## 2007-10-09 DIAGNOSIS — K279 Peptic ulcer, site unspecified, unspecified as acute or chronic, without hemorrhage or perforation: Secondary | ICD-10-CM | POA: Insufficient documentation

## 2007-10-09 DIAGNOSIS — K449 Diaphragmatic hernia without obstruction or gangrene: Secondary | ICD-10-CM | POA: Insufficient documentation

## 2007-10-09 DIAGNOSIS — E7800 Pure hypercholesterolemia, unspecified: Secondary | ICD-10-CM

## 2007-10-09 DIAGNOSIS — K649 Unspecified hemorrhoids: Secondary | ICD-10-CM | POA: Insufficient documentation

## 2007-10-09 DIAGNOSIS — E876 Hypokalemia: Secondary | ICD-10-CM

## 2007-10-09 DIAGNOSIS — I219 Acute myocardial infarction, unspecified: Secondary | ICD-10-CM | POA: Insufficient documentation

## 2007-10-09 HISTORY — DX: Unspecified hemorrhoids: K64.9

## 2007-10-09 HISTORY — DX: Diaphragmatic hernia without obstruction or gangrene: K44.9

## 2007-10-09 HISTORY — DX: Acute myocardial infarction, unspecified: I21.9

## 2007-10-09 HISTORY — DX: Peptic ulcer, site unspecified, unspecified as acute or chronic, without hemorrhage or perforation: K27.9

## 2007-10-09 HISTORY — DX: Hypokalemia: E87.6

## 2007-10-09 HISTORY — DX: Pure hypercholesterolemia, unspecified: E78.00

## 2007-10-24 ENCOUNTER — Encounter: Payer: Self-pay | Admitting: Internal Medicine

## 2007-10-24 ENCOUNTER — Ambulatory Visit: Payer: Self-pay | Admitting: *Deleted

## 2008-02-16 ENCOUNTER — Emergency Department (HOSPITAL_BASED_OUTPATIENT_CLINIC_OR_DEPARTMENT_OTHER): Admission: EM | Admit: 2008-02-16 | Discharge: 2008-02-16 | Payer: Self-pay | Admitting: Emergency Medicine

## 2008-03-14 IMAGING — CT CT ANGIO ABDOMEN
3 of 7 series · 13 of 46 positions shown, 18 images · IV contrast (APPLIED)
Comparison: none

CLINICAL DATA: Weight loss, postprandial pain, renal stent

[Series 3: abd_w/o 5.0 b30f · axial · 0.74mm/px · z∈[-400,-225]mm · 6 of 49 slices shown]
[im 7/49  soft-tissue]
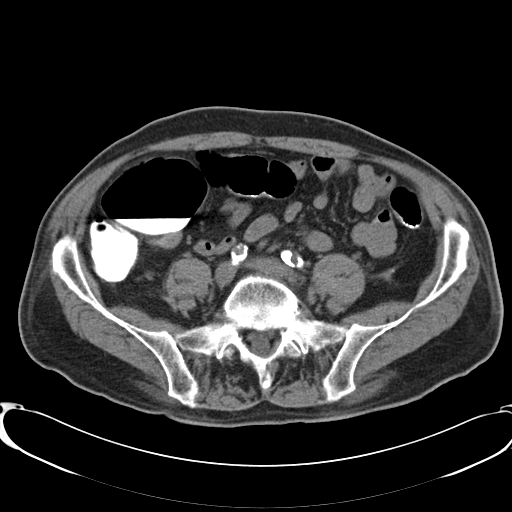
[im 14/49  soft-tissue]
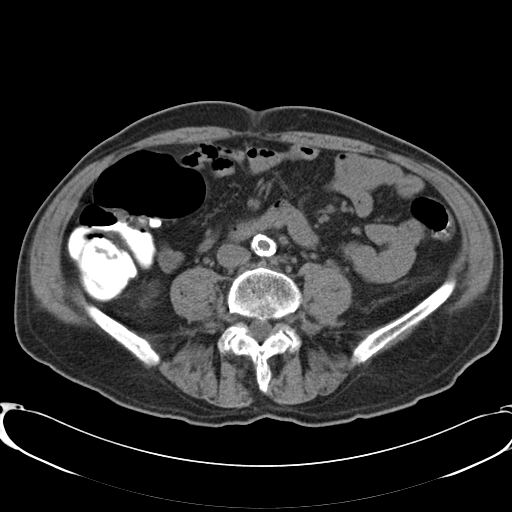
[im 21/49  soft-tissue]
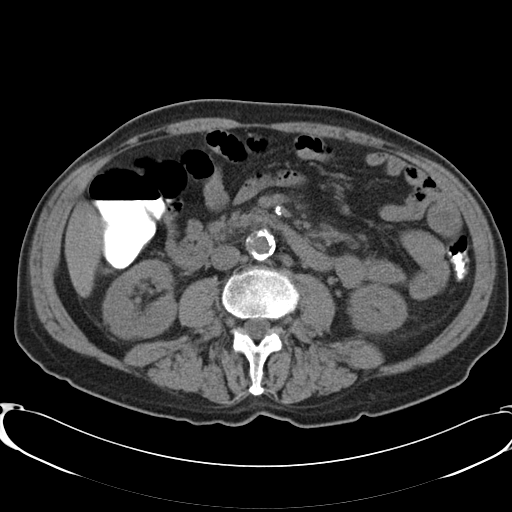
[im 28/49  soft-tissue]
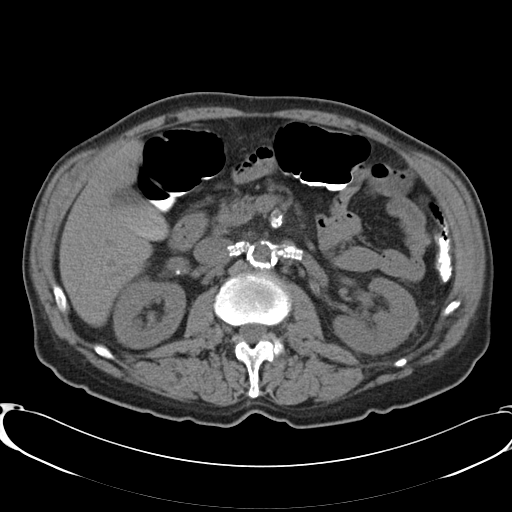
[im 35/49  soft-tissue]
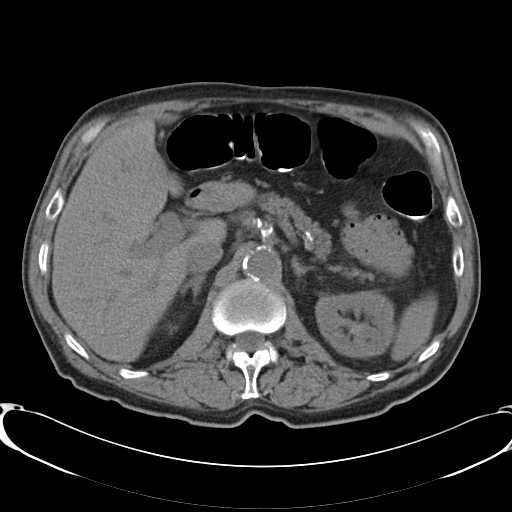
[im 42/49  soft-tissue]
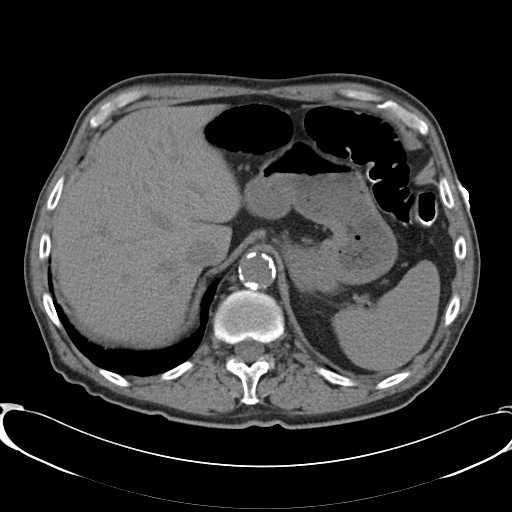

[Series 6: renal/mesent 3.0 b30f · axial · 0.74mm/px · z∈[-379,-358]mm · 2 of 70 slices shown]
[im 7/70  soft-tissue]
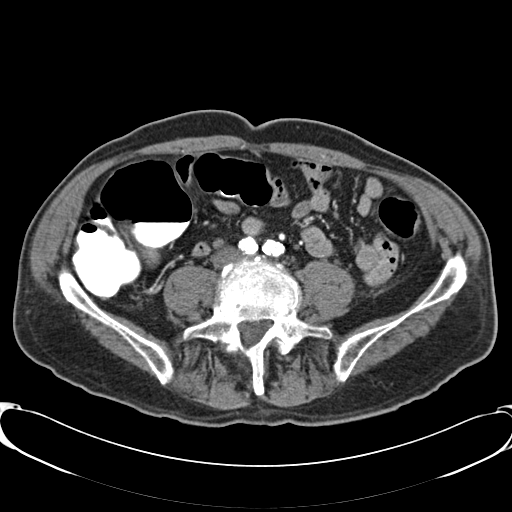
[im 14/70  soft-tissue]
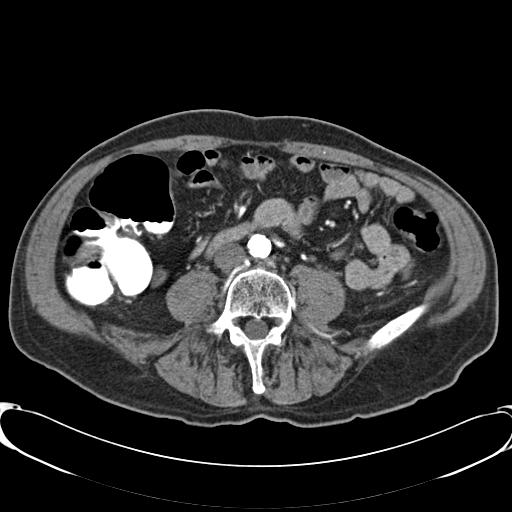

[Series 8: venous 5.0 b30f · axial · portal-venous · 0.74mm/px · z∈[-365,-225]mm · 5 of 42 slices shown, 10 images]
[im 7/42  soft-tissue]
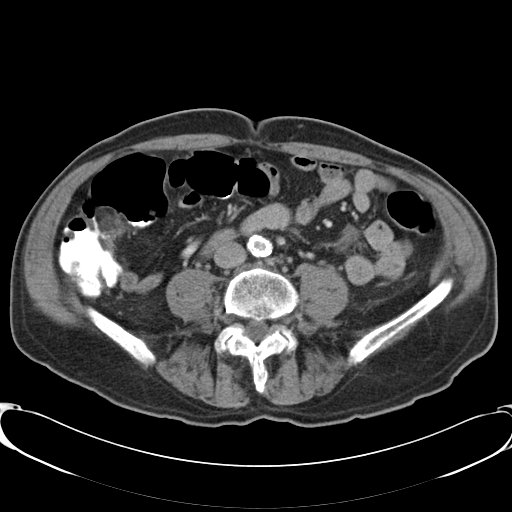
[im 7/42  bone]
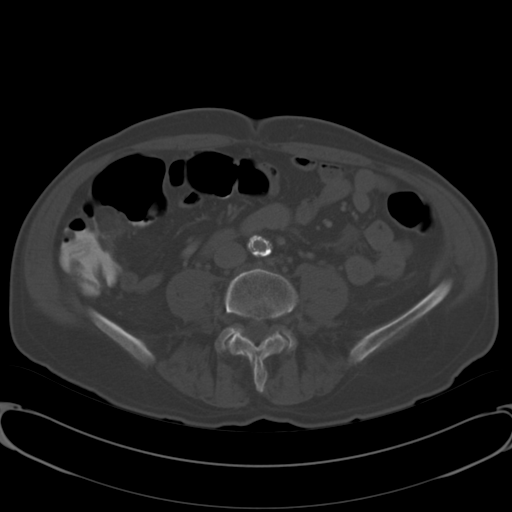
[im 14/42  soft-tissue]
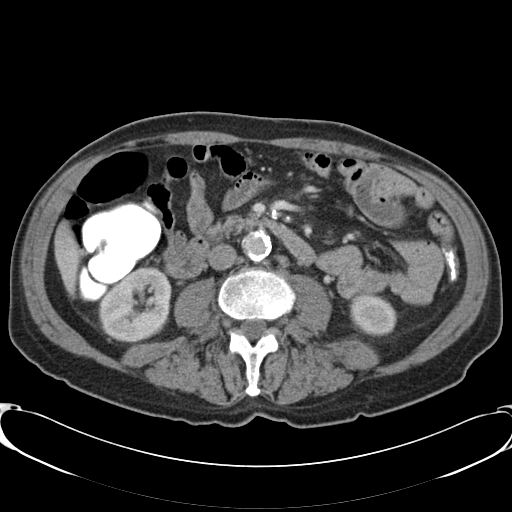
[im 14/42  lung]
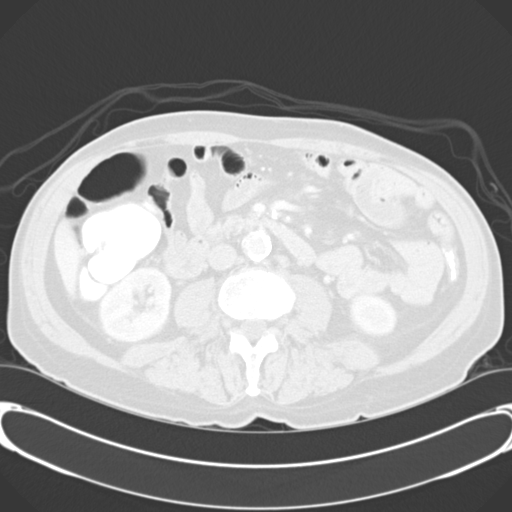
[im 21/42  soft-tissue]
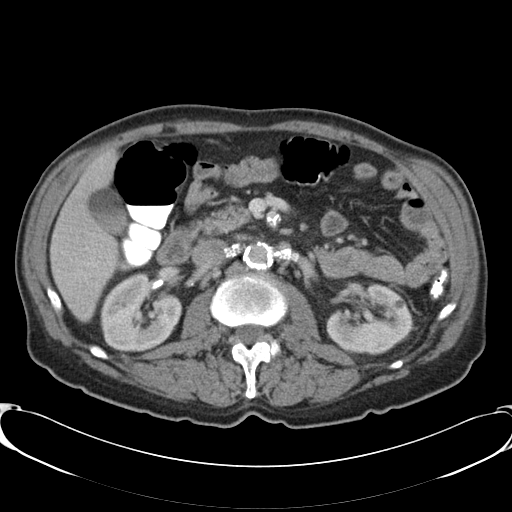
[im 21/42  lung]
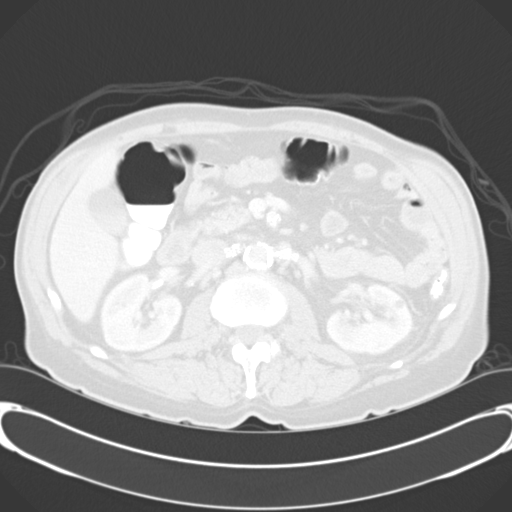
[im 28/42  soft-tissue]
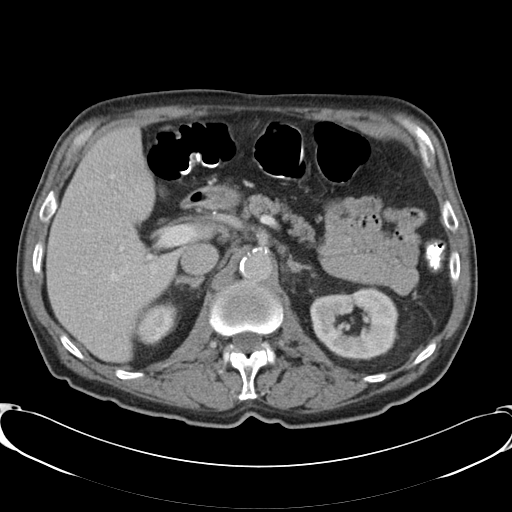
[im 28/42  lung]
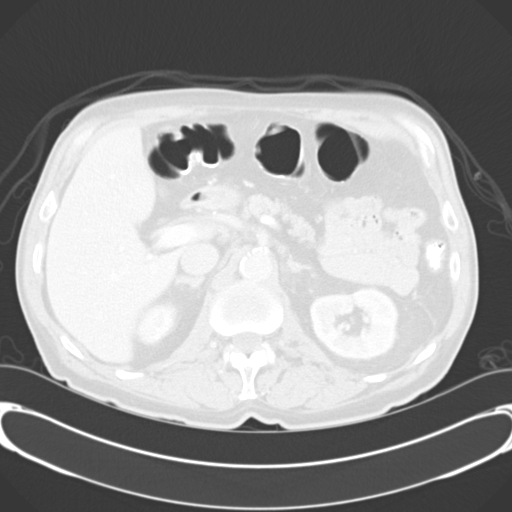
[im 35/42  soft-tissue]
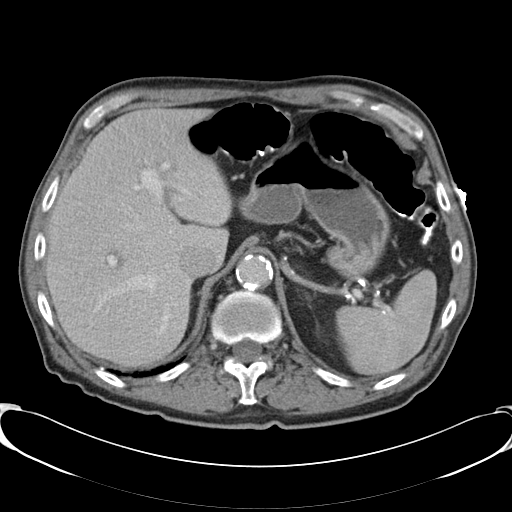
[im 35/42  lung]
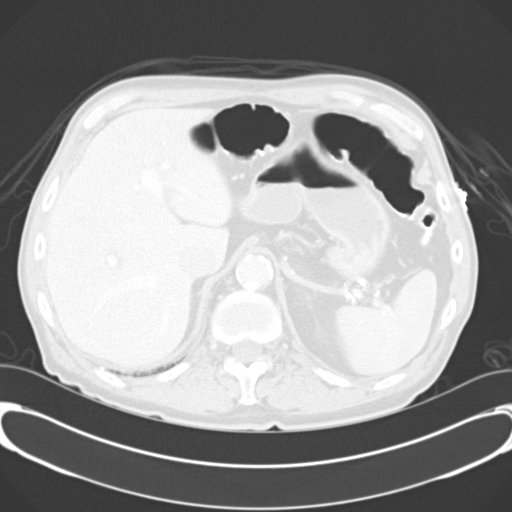

[13 of 46 positions shown; findings below may reference images not displayed]

CT angiogram abdomen with contrast:

Multidetector helical CT of the abdomen  was obtained after 125  ml Omnipaque
350 IV. CT multiplanar reconstructions were rendered to evaluate the vascular
anatomy.
No previous for comparison. Noncontrast scan shows heavy atherosclerotic
calcification throughout nondilated abdominal aorta. Bilateral renal and common
iliac stents are noted. Residual contrast in colon. Coronary calcifications are
noted.
CTA shows diffuse atheromatous irregularity of the abdominal aorta without  
dissection or stenosis. Heavily calcified plaque at the origin of the celiac
axis with a high grade stenosis estimated greater than 80% diameter, extending
over a short segment, with some mild post dilatation of the more distal celiac
axis. There is distal tandem partially calcified lesions through the splenic
artery and in the proximal common hepatic artery. 
There is heavily calcified eccentric plaque just beyond the origin of the SMA,
also resulting at least 80% diameter stenosis over a relatively short segment.
There are tandem moderate heavily calcified lesions more distally in the SMA and
proximally in its branches. The inferior mesenteric artery remains patent with
heavily calcified ostial plaque related to aortic wall plaque resulting in at
least 75% diameter stenosis, and eccentric focal calcified tandem lesions more
distally in the IMA. Bilateral renal artery osteal stents appear patent but
slightly undersized compared to the more distal native renal arteries. Bilateral
common iliac artery stents remain patent. Venous phase shows patency of hepatic
veins, portal vein, SMV, splenic vein, bilateral renal veins. Visualized small
bowel and colon unremarkable. No free air. No ascites. 2 cm low attenuation left
renal lesion as previously noted.
IMPRESSION: 1. Atheromatous abdominal aorta with proximal  stenoses of celiac axis, SMA, and
IMA which  may   account for occlusive mesenteric ischemia. Distal tandem
lesions likely exacerbate the proximal occlusive lesions.

## 2008-03-19 IMAGING — XA IR US GUIDE VASC ACCESS RIGHT
1 series · 13 of 24 positions shown · IV contrast (omnipaque)
Comparison: none

CLINICAL DATA: Mesenteric ischemia with symptoms of nausea, vomiting, and abdominal pain with meals.  CTA has demonstrated significant disease of both the celiac trunk and superior mesenteric artery.  Further arteriography is now performed in planning potential surgical versus endovascular intervention.
ULTRASOUND GUIDANCE FOR VASCULAR ACCESS OF THE RIGHT COMMON FEMORAL ARTERY:
SELECTIVE VISCERAL ARTERIOGRAPHY OF THE SUPERIOR MESENTERIC ARTERY AND INFERIOR MESENTERIC ARTERY - 06/24/07: 
Consent:  Prior to the procedure informed consent was obtained. 
Sedation:  3 mg IV Versed, 100 mcg IV fentanyl.  
Total Moderate Sedation Time:  40 minutes. 
Contrast:  191 cc Omnipaque 300.
Fluoro Time:  14.4 minutes. 
Procedure:  The right groin was sterilely prepped and draped.  Local anesthesia was provided with 1% lidocaine.  Ultrasound was used to localize the common femoral artery.  Under direct ultrasound guidance, a 21-gauge needle was advanced into the artery with image documentation performed.  After securing guidewire access, a 5-French vascular sheath was placed. 
A 5-French pigtail catheter was then advanced over a guidewire into the abdominal aorta.  Lateral projection abdominal aortogram was then performed in two different projections.  The pigtail catheter was then removed over a guidewire. 
A 5-French Cobra catheter was then advanced into the aorta.  This was used to initially catheterize the superior mesenteric artery.  Selective arteriography was performed.  Superior mesenteric arterial injection was performed in two different projections.
Attempt was made to catheterize the celiac origin with both Cobra and Sos catheters.  The Sos catheter was then used to catheterize the inferior mesenteric artery and selective arteriography performed of the entire inferior mesenteric arterial supply.
The catheter was then removed.  The sheath was then removed and hemostasis obtained with manual compression.
Complication:  None.

[Series 1000: run · 0.16mm/px · 13 of 93 slices shown]
[im 1/93]
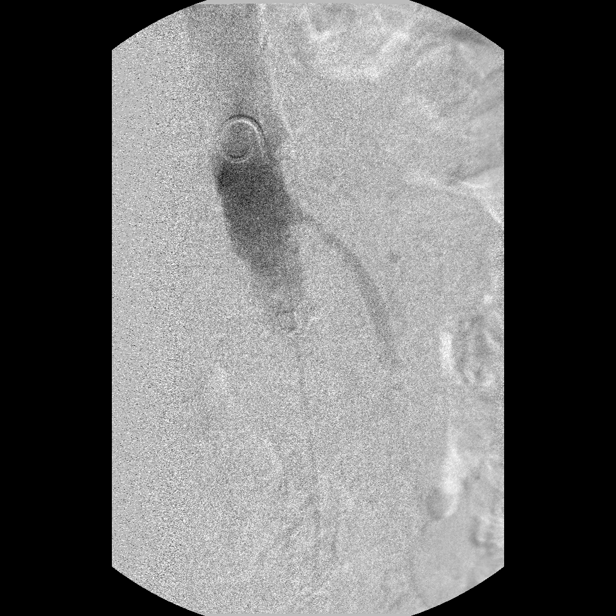
[im 9/93]
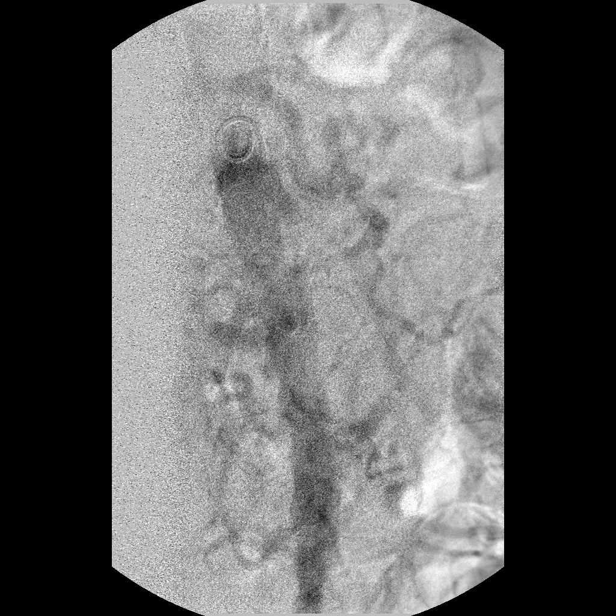
[im 17/93]
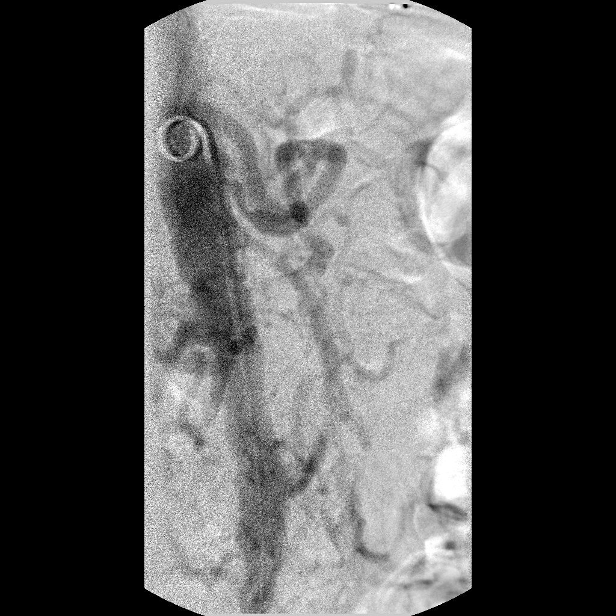
[im 25/93]
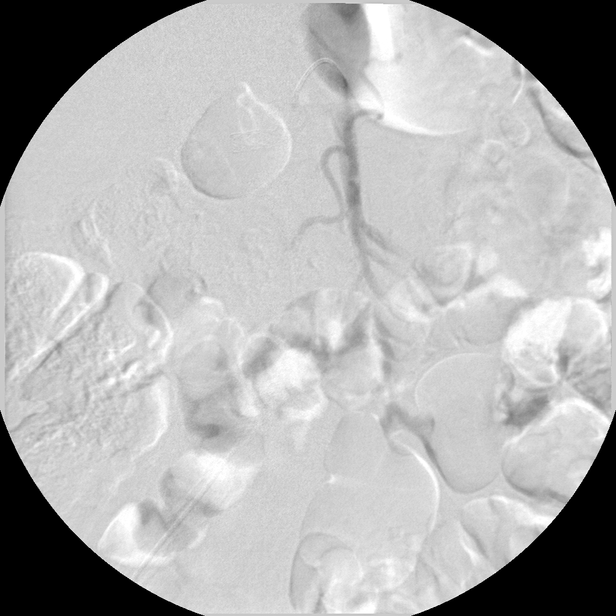
[im 33/93]
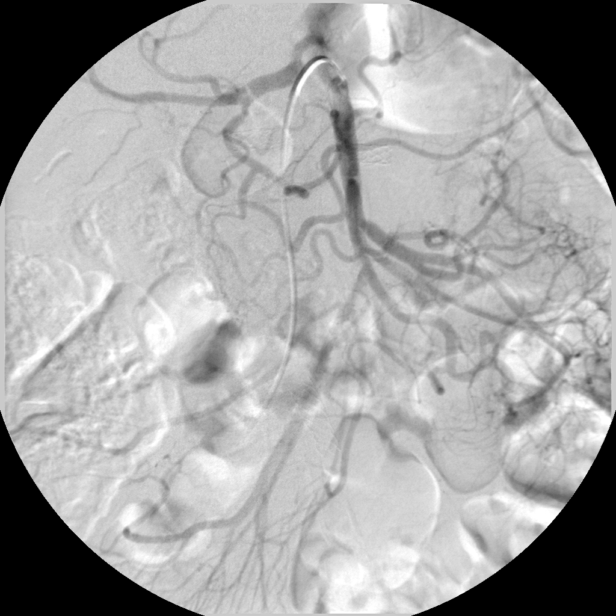
[im 41/93]
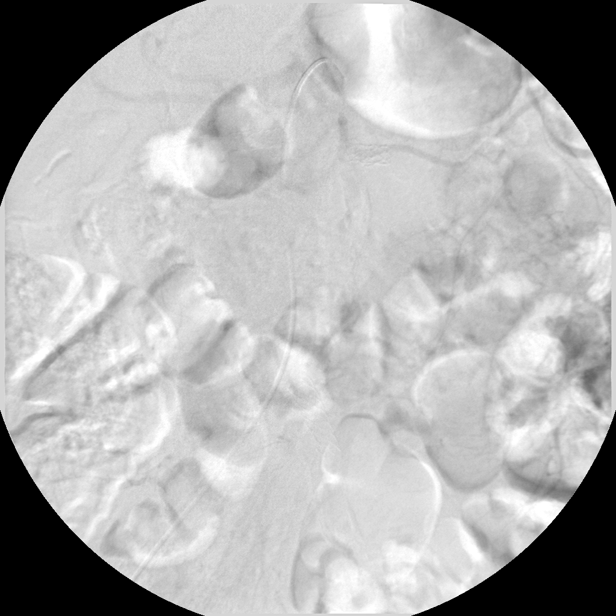
[im 49/93]
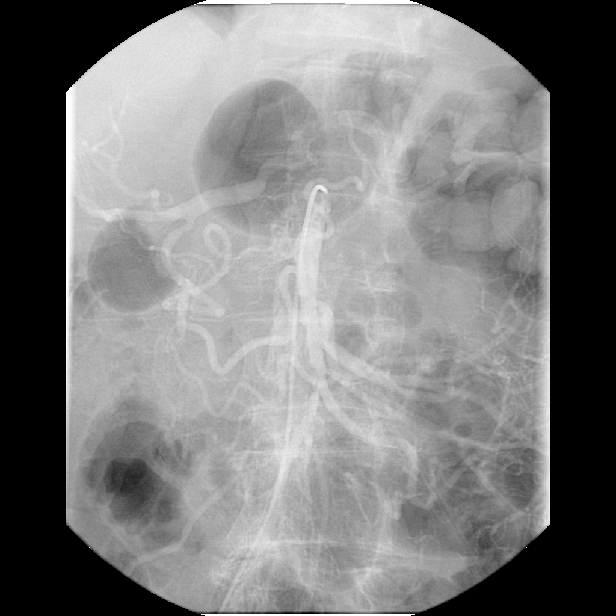
[im 53/93]
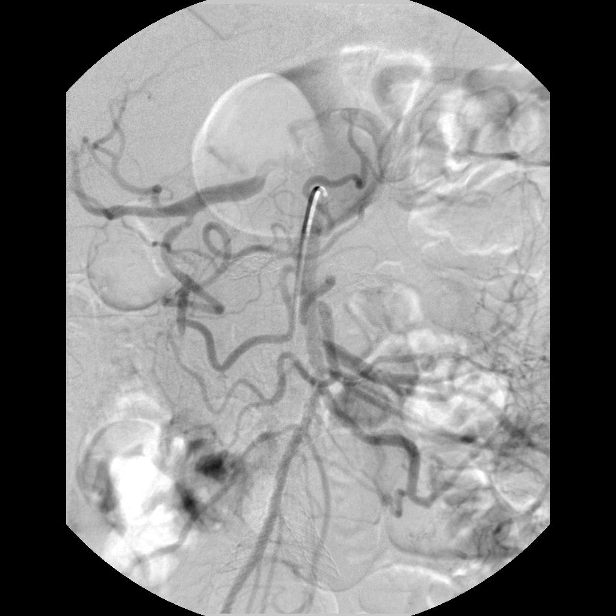
[im 61/93]
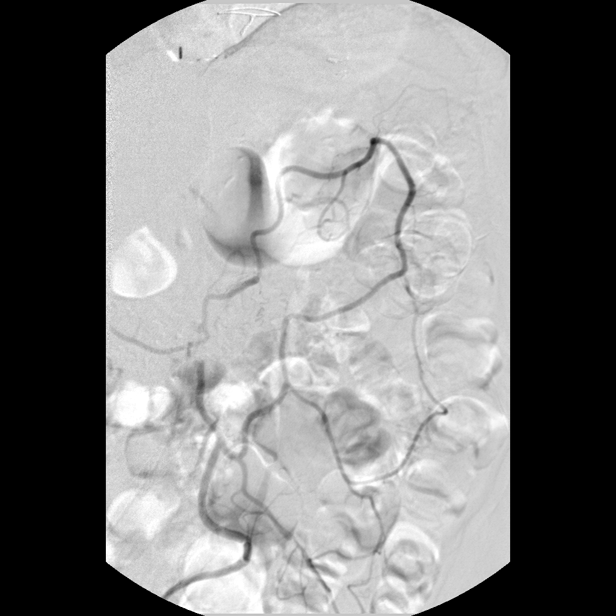
[im 69/93]
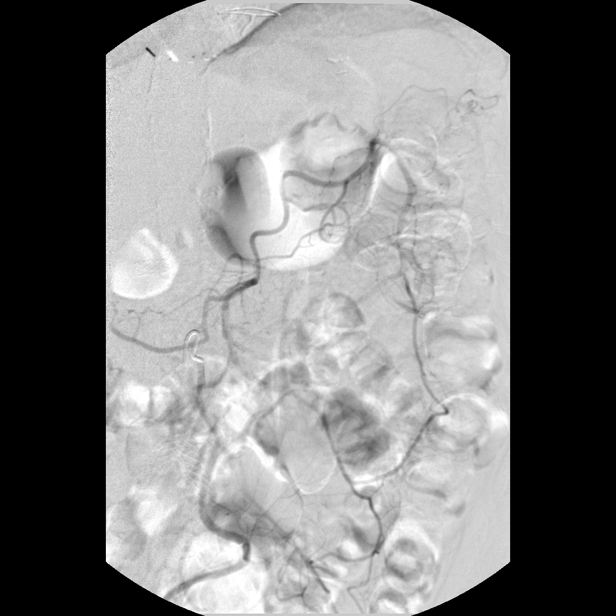
[im 77/93]
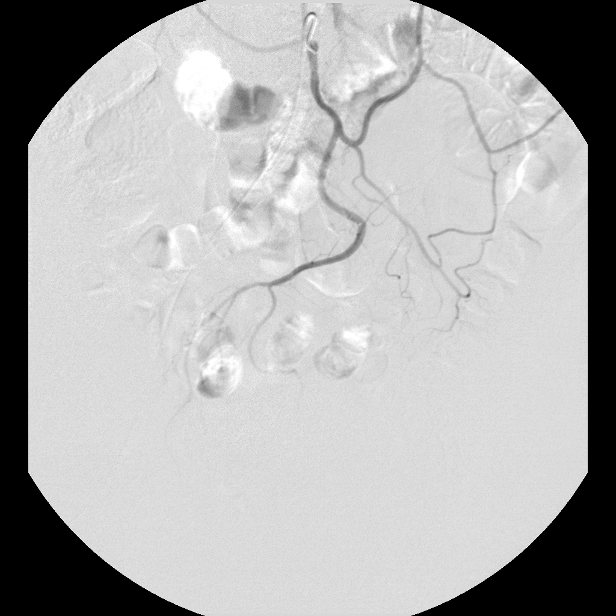
[im 85/93]
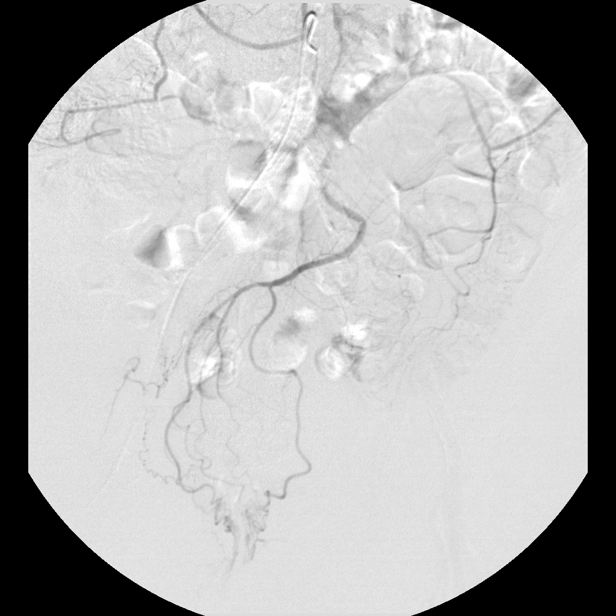
[im 93/93]
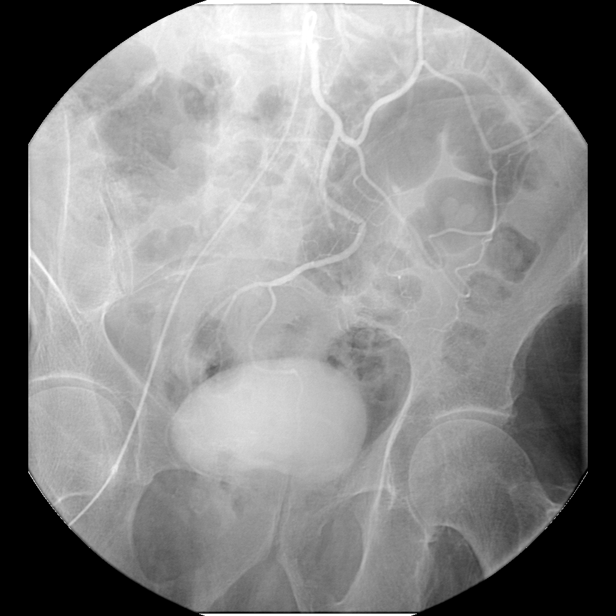

[13 of 24 positions shown; findings below may reference images not displayed]

FINDINGS: The celiac axis is essentially occluded at its origin and was not able to be selectively catheterized.  Predominant filling of the celiac branches is via retrograde flow from the inferior pancreatico-duodenal arcade and gastroduodenal artery to the level of the common hepatic artery.  antegrade flow is present through the origin of the celiac axis.  
Diffuse disease is present involving the proximal superior mesenteric artery.  Just beyond the vessel origin, irregular calcified plaque is present causing at least 70-80% diffuse stenosis over several centimeters and extending into the main trunk of the superior mesenteric artery.  Irregular plaque is also seen more distally in the trunk and at the origin of at least one major branch supplying the jejunum.  At the origin of this branch, 50-70% stenosis is present. 
The origin of the inferior mesenteric artery is normally patent.  The inferior mesenteric artery supply is visibly compensatorily dilated, supplying the entire colon.
IMPRESSION: 1.  Essential subtotal occlusion of the celiac origin with predominant retrograde supply of celiac branches via reversed flow in the gastroduodenal artery.
2.  Severe disease of a long segment of the proximal superior mesenteric arterial trunk with significant irregular atherosclerotic plaque present.  This causes at least 70-80% stenosis of the trunk, and there are also tandem areas of disease of branch vessels.  These vessels were noted to be involved by diffusely calcified plaque by CT angiography. 
3.  Widely patent inferior mesenteric artery which has compensated to supply the entire colon via collateral supply.

## 2008-05-07 ENCOUNTER — Ambulatory Visit: Payer: Self-pay | Admitting: *Deleted

## 2008-06-29 ENCOUNTER — Telehealth: Payer: Self-pay | Admitting: Internal Medicine

## 2008-07-06 ENCOUNTER — Ambulatory Visit: Payer: Self-pay | Admitting: Internal Medicine

## 2008-07-06 DIAGNOSIS — K551 Chronic vascular disorders of intestine: Secondary | ICD-10-CM

## 2008-07-06 HISTORY — DX: Chronic vascular disorders of intestine: K55.1

## 2008-07-16 ENCOUNTER — Ambulatory Visit: Payer: Self-pay | Admitting: *Deleted

## 2008-07-23 ENCOUNTER — Encounter: Admission: RE | Admit: 2008-07-23 | Discharge: 2008-07-23 | Payer: Self-pay | Admitting: *Deleted

## 2008-07-23 ENCOUNTER — Ambulatory Visit: Payer: Self-pay | Admitting: *Deleted

## 2008-08-14 HISTORY — PX: CORONARY ANGIOPLASTY WITH STENT PLACEMENT: SHX49

## 2008-08-21 ENCOUNTER — Ambulatory Visit (HOSPITAL_COMMUNITY): Admission: RE | Admit: 2008-08-21 | Discharge: 2008-08-21 | Payer: Self-pay | Admitting: *Deleted

## 2008-08-21 ENCOUNTER — Ambulatory Visit: Payer: Self-pay | Admitting: *Deleted

## 2008-08-26 ENCOUNTER — Ambulatory Visit: Payer: Self-pay

## 2008-08-27 ENCOUNTER — Ambulatory Visit: Payer: Self-pay | Admitting: *Deleted

## 2008-09-18 ENCOUNTER — Inpatient Hospital Stay (HOSPITAL_COMMUNITY): Admission: RE | Admit: 2008-09-18 | Discharge: 2008-09-26 | Payer: Self-pay | Admitting: *Deleted

## 2008-09-18 ENCOUNTER — Ambulatory Visit: Payer: Self-pay | Admitting: *Deleted

## 2008-09-28 ENCOUNTER — Telehealth (INDEPENDENT_AMBULATORY_CARE_PROVIDER_SITE_OTHER): Payer: Self-pay | Admitting: *Deleted

## 2008-10-08 ENCOUNTER — Ambulatory Visit: Payer: Self-pay | Admitting: *Deleted

## 2008-11-11 IMAGING — CR DG WRIST COMPLETE 3+V*L*
4 series · 4 of 4 positions shown · non-contrast
Comparison: None

CLINICAL DATA: Injured work no yard with pain and swelling in the
wrist

LEFT WRIST - COMPLETE 3+ VIEW

[x wrist pa left]
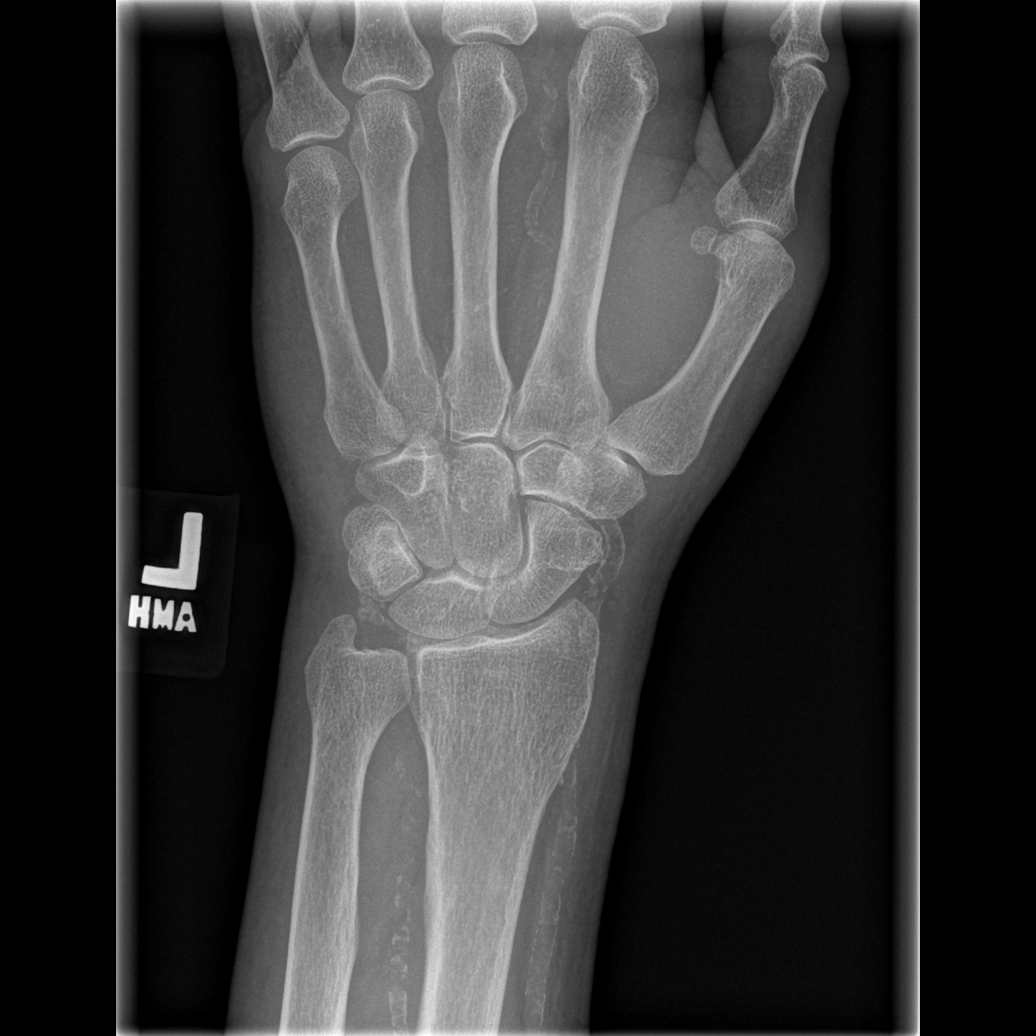

[x wrist obl left]
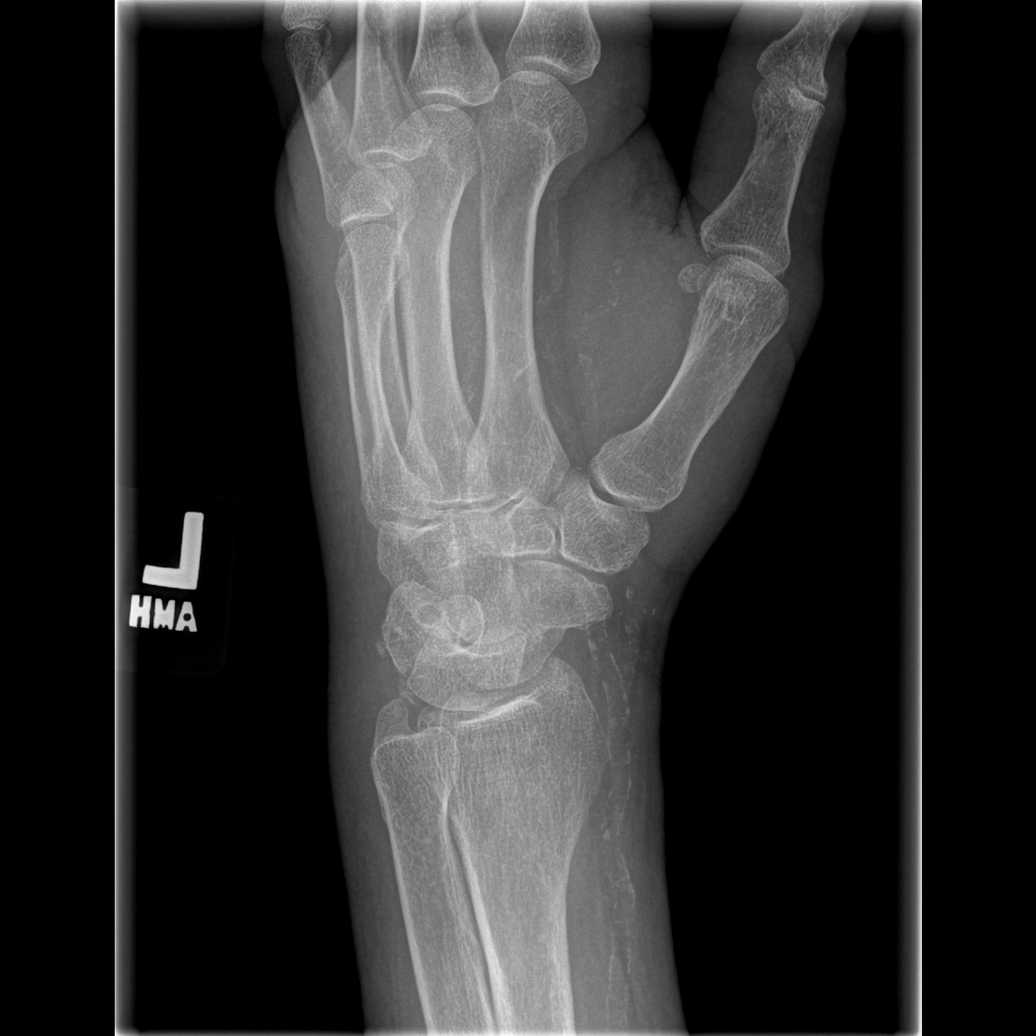

[x wrist lat left]
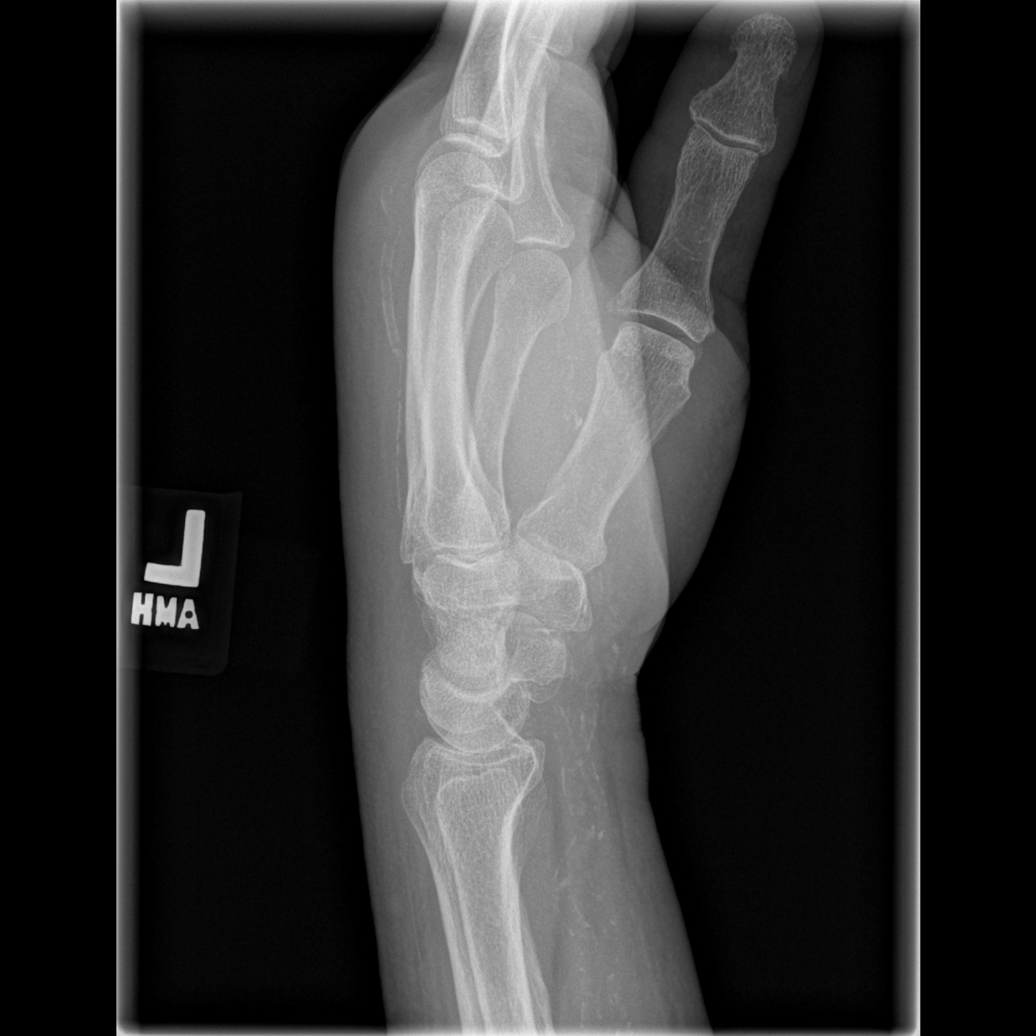

[x navicular]
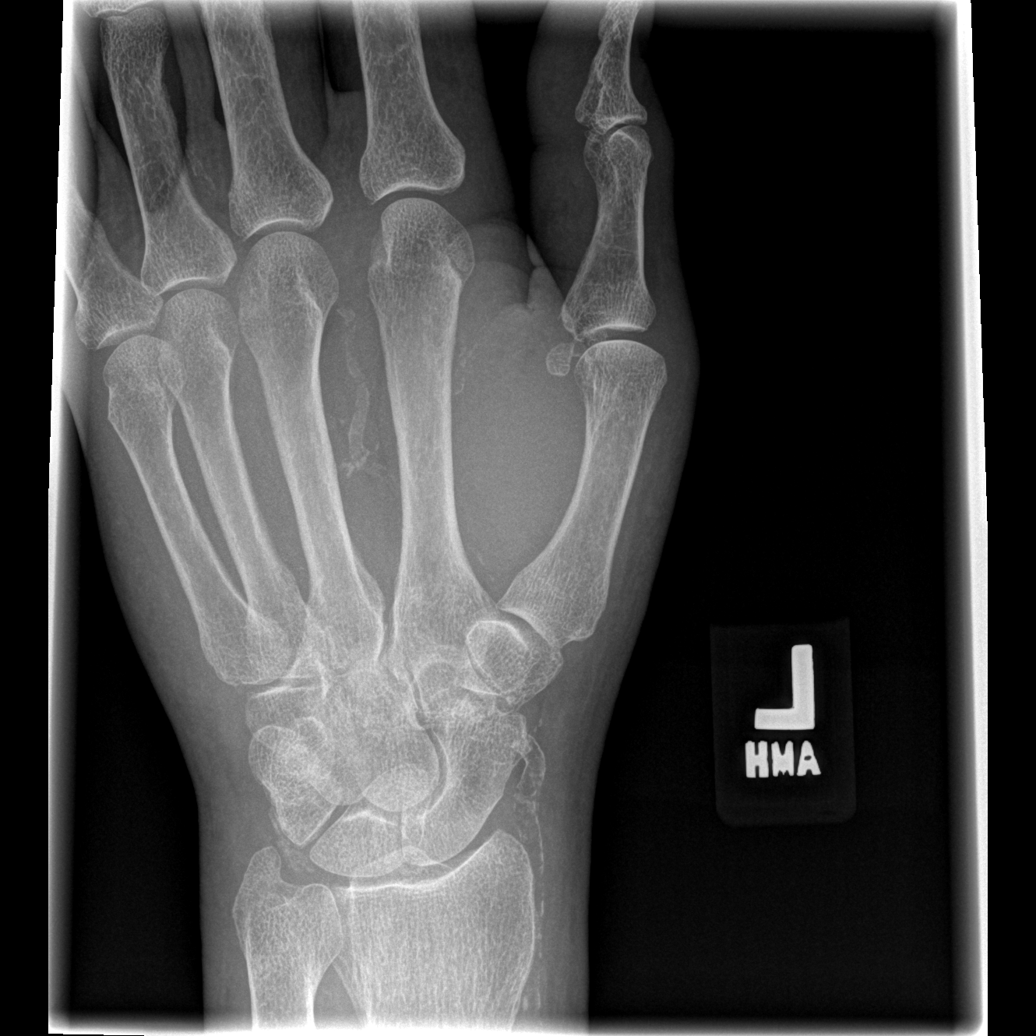

[4 of 4 positions shown; findings below may reference images not displayed]

FINDINGS: The bones are diffusely osteopenic.  Arterial
calcifications are noted.  There is calcification in the region of
the triangular fibrocartilage suggestive of CPPD.  Radiocarpal
joint space is slightly narrowed.  The carpal bones are in normal
position.  No acute bony abnormality is seen.
IMPRESSION: No acute abnormality.  Chondrocalcinosis suggests CPPD.

## 2008-11-15 ENCOUNTER — Inpatient Hospital Stay (HOSPITAL_COMMUNITY): Admission: EM | Admit: 2008-11-15 | Discharge: 2008-11-19 | Payer: Self-pay | Admitting: Emergency Medicine

## 2008-11-15 ENCOUNTER — Ambulatory Visit: Payer: Self-pay | Admitting: Cardiology

## 2008-11-16 ENCOUNTER — Encounter (INDEPENDENT_AMBULATORY_CARE_PROVIDER_SITE_OTHER): Payer: Self-pay | Admitting: *Deleted

## 2008-11-17 ENCOUNTER — Encounter: Payer: Self-pay | Admitting: Cardiovascular Disease

## 2008-11-27 DIAGNOSIS — I080 Rheumatic disorders of both mitral and aortic valves: Secondary | ICD-10-CM

## 2008-11-27 HISTORY — DX: Rheumatic disorders of both mitral and aortic valves: I08.0

## 2008-11-30 ENCOUNTER — Encounter: Payer: Self-pay | Admitting: Cardiovascular Disease

## 2008-11-30 ENCOUNTER — Ambulatory Visit: Payer: Self-pay | Admitting: Cardiovascular Disease

## 2008-12-04 ENCOUNTER — Telehealth: Payer: Self-pay | Admitting: Cardiovascular Disease

## 2008-12-17 ENCOUNTER — Ambulatory Visit: Payer: Self-pay | Admitting: *Deleted

## 2008-12-21 ENCOUNTER — Ambulatory Visit (HOSPITAL_COMMUNITY): Admission: RE | Admit: 2008-12-21 | Discharge: 2008-12-21 | Payer: Self-pay | Admitting: *Deleted

## 2008-12-21 ENCOUNTER — Ambulatory Visit: Payer: Self-pay | Admitting: Vascular Surgery

## 2009-01-08 ENCOUNTER — Inpatient Hospital Stay (HOSPITAL_COMMUNITY): Admission: RE | Admit: 2009-01-08 | Discharge: 2009-01-14 | Payer: Self-pay | Admitting: *Deleted

## 2009-01-08 ENCOUNTER — Encounter (INDEPENDENT_AMBULATORY_CARE_PROVIDER_SITE_OTHER): Payer: Self-pay | Admitting: *Deleted

## 2009-01-12 ENCOUNTER — Ambulatory Visit: Payer: Self-pay | Admitting: Vascular Surgery

## 2009-01-12 ENCOUNTER — Encounter (INDEPENDENT_AMBULATORY_CARE_PROVIDER_SITE_OTHER): Payer: Self-pay | Admitting: *Deleted

## 2009-01-17 ENCOUNTER — Emergency Department (HOSPITAL_COMMUNITY): Admission: EM | Admit: 2009-01-17 | Discharge: 2009-01-17 | Payer: Self-pay | Admitting: Emergency Medicine

## 2009-01-22 ENCOUNTER — Ambulatory Visit: Payer: Self-pay | Admitting: Internal Medicine

## 2009-01-22 DIAGNOSIS — R5383 Other fatigue: Secondary | ICD-10-CM

## 2009-01-22 DIAGNOSIS — R339 Retention of urine, unspecified: Secondary | ICD-10-CM

## 2009-01-22 DIAGNOSIS — R5381 Other malaise: Secondary | ICD-10-CM

## 2009-01-22 HISTORY — DX: Other fatigue: R53.83

## 2009-01-22 HISTORY — DX: Retention of urine, unspecified: R33.9

## 2009-01-22 HISTORY — DX: Other malaise: R53.81

## 2009-01-22 LAB — CONVERTED CEMR LAB
Albumin: 3.2 g/dL — ABNORMAL LOW (ref 3.5–5.2)
BUN: 8 mg/dL (ref 6–23)
Basophils Absolute: 0 10*3/uL (ref 0.0–0.1)
CO2: 28 meq/L (ref 19–32)
Cholesterol: 125 mg/dL (ref 0–200)
Eosinophils Absolute: 0.3 10*3/uL (ref 0.0–0.7)
Folate: 8 ng/mL
GFR calc non Af Amer: 100.95 mL/min (ref >60)
Glucose, Bld: 108 mg/dL — ABNORMAL HIGH (ref 70–99)
HCT: 35.8 % — ABNORMAL LOW (ref 39.0–52.0)
HDL: 54.4 mg/dL (ref >39.00)
Hemoglobin: 12.1 g/dL — ABNORMAL LOW (ref 13.0–17.0)
Hgb A1c MFr Bld: 5.7 % (ref 4.6–6.5)
Lymphs Abs: 1.7 10*3/uL (ref 0.7–4.0)
MCHC: 33.7 g/dL (ref 30.0–36.0)
Monocytes Absolute: 0.4 10*3/uL (ref 0.1–1.0)
Neutro Abs: 4.4 10*3/uL (ref 1.4–7.7)
PSA: 1.1 ng/mL (ref 0.10–4.00)
Platelets: 385 10*3/uL (ref 150.0–400.0)
Potassium: 4.3 meq/L (ref 3.5–5.1)
RDW: 16.2 % — ABNORMAL HIGH (ref 11.5–14.6)
Saturation Ratios: 22.5 % (ref 20.0–50.0)
Sodium: 139 meq/L (ref 135–145)
TSH: 1.46 microintl units/mL (ref 0.35–5.50)
Total Bilirubin: 0.8 mg/dL (ref 0.3–1.2)
Triglycerides: 88 mg/dL (ref 0.0–149.0)
VLDL: 17.6 mg/dL (ref 0.0–40.0)
Vitamin B-12: 268 pg/mL (ref 211–911)

## 2009-01-26 ENCOUNTER — Ambulatory Visit: Payer: Self-pay | Admitting: Vascular Surgery

## 2009-01-27 ENCOUNTER — Encounter: Payer: Self-pay | Admitting: Internal Medicine

## 2009-01-28 ENCOUNTER — Telehealth: Payer: Self-pay | Admitting: Internal Medicine

## 2009-01-28 ENCOUNTER — Telehealth: Payer: Self-pay | Admitting: Cardiovascular Disease

## 2009-01-28 ENCOUNTER — Emergency Department (HOSPITAL_COMMUNITY): Admission: EM | Admit: 2009-01-28 | Discharge: 2009-01-28 | Payer: Self-pay | Admitting: Emergency Medicine

## 2009-02-04 ENCOUNTER — Ambulatory Visit: Payer: Self-pay | Admitting: *Deleted

## 2009-02-05 ENCOUNTER — Ambulatory Visit: Payer: Self-pay | Admitting: Endocrinology

## 2009-02-12 ENCOUNTER — Telehealth (INDEPENDENT_AMBULATORY_CARE_PROVIDER_SITE_OTHER): Payer: Self-pay | Admitting: *Deleted

## 2009-02-18 ENCOUNTER — Ambulatory Visit: Payer: Self-pay | Admitting: Internal Medicine

## 2009-02-18 DIAGNOSIS — J441 Chronic obstructive pulmonary disease with (acute) exacerbation: Secondary | ICD-10-CM

## 2009-02-18 HISTORY — DX: Chronic obstructive pulmonary disease with (acute) exacerbation: J44.1

## 2009-03-04 ENCOUNTER — Ambulatory Visit: Payer: Self-pay | Admitting: *Deleted

## 2009-04-18 IMAGING — CT CT ANGIO ABDOMEN
2 of 7 series · 15 of 46 positions shown, 19 images · IV contrast ([ID] OMNI 350)
Comparison: 06/19/2007

CTA ABDOMEN

CLINICAL DATA: CHRONIC MESENTERIC ISCHEMIA.  BILATERAL RENAL
STENTS.

CT ANGIOGRAPHY ABDOMEN AND PELVIS
TECHNIQUE: Multidetector CT imaging through the chest, abdomen and
pelvis was performed using the standard protocol during bolus
administration of intravenous contrast. Multiplanar reconstructed
images including MIPs were obtained and reviewed to evaluate the
vascular anatomy.
Contrast: 125 ml Omnipaque 350 IV

[Series 5: recon 2: renal/sma cta · axial · 0.70mm/px · z∈[-423,-48]mm · 12 of 245 slices shown, 16 images]
[im 22/245  soft-tissue]
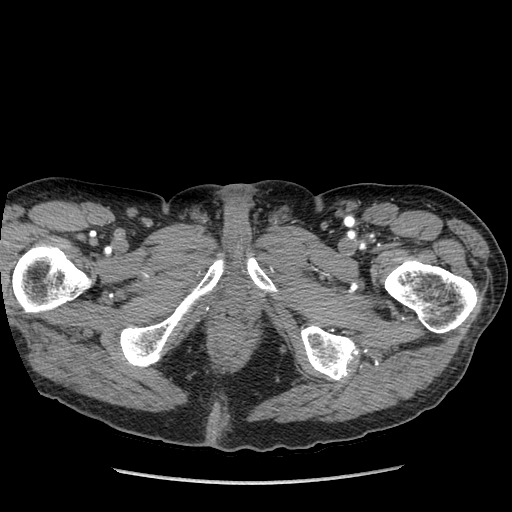
[im 22/245  bone]
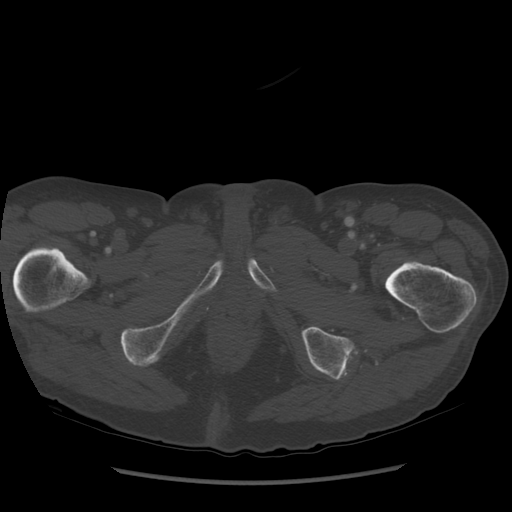
[im 43/245  soft-tissue]
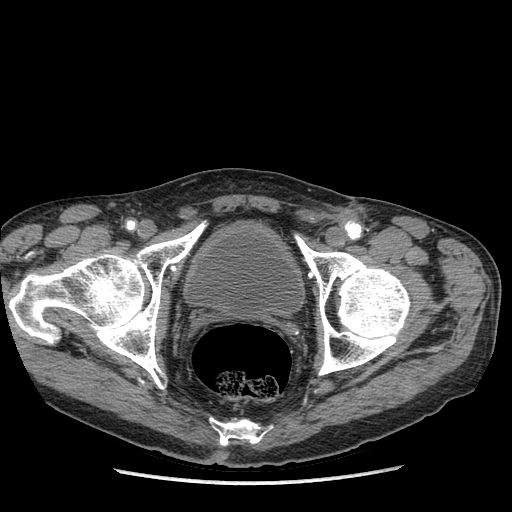
[im 64/245  soft-tissue]
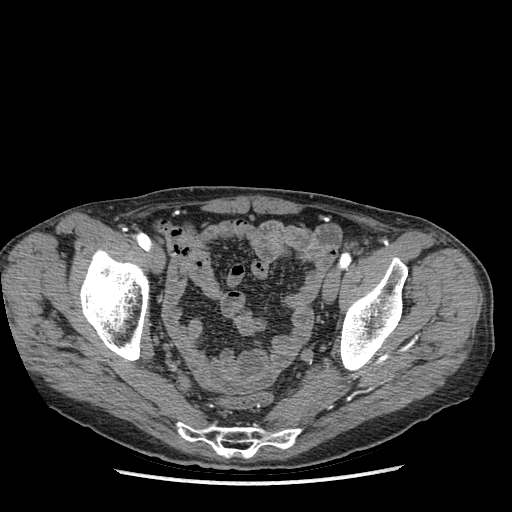
[im 85/245  soft-tissue]
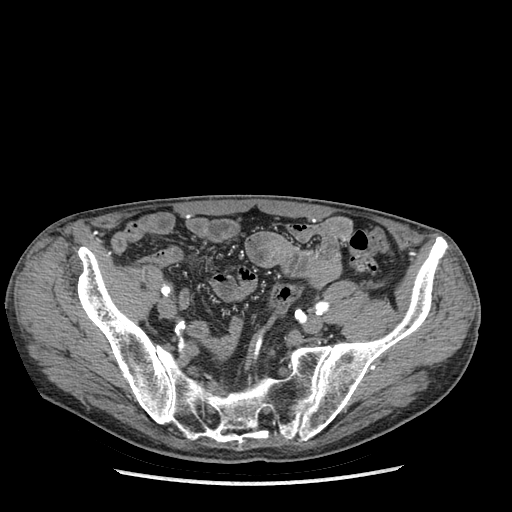
[im 107/245  soft-tissue]
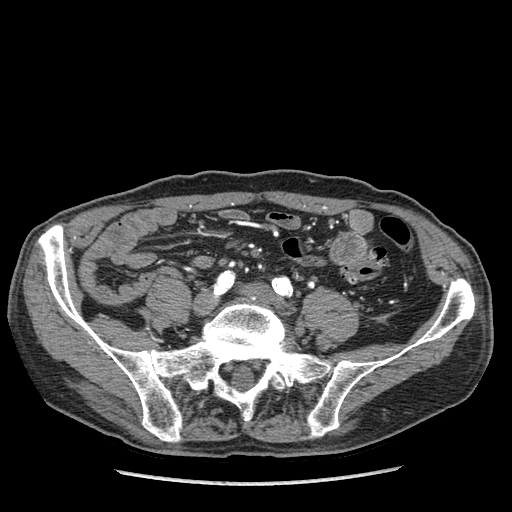
[im 138/245  soft-tissue]
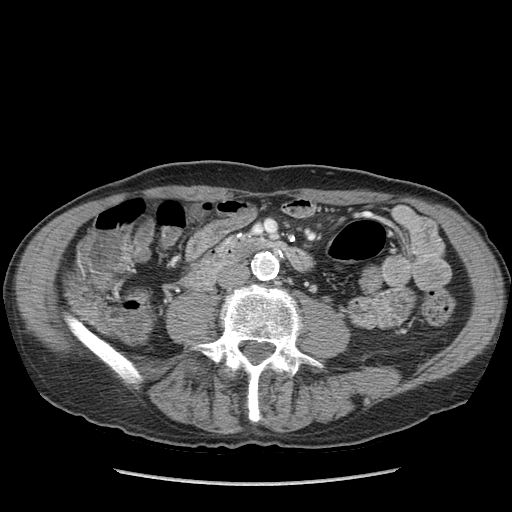
[im 160/245  soft-tissue]
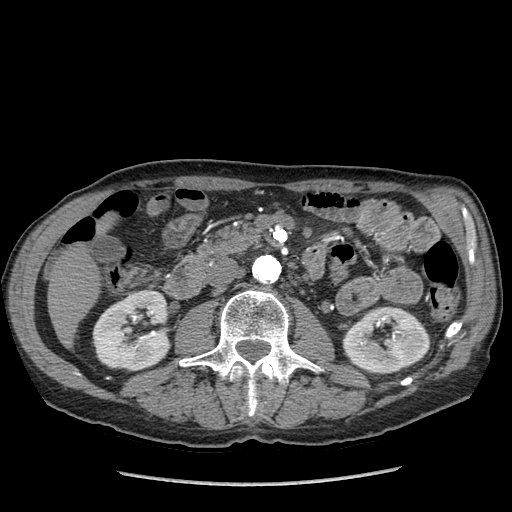
[im 181/245  soft-tissue]
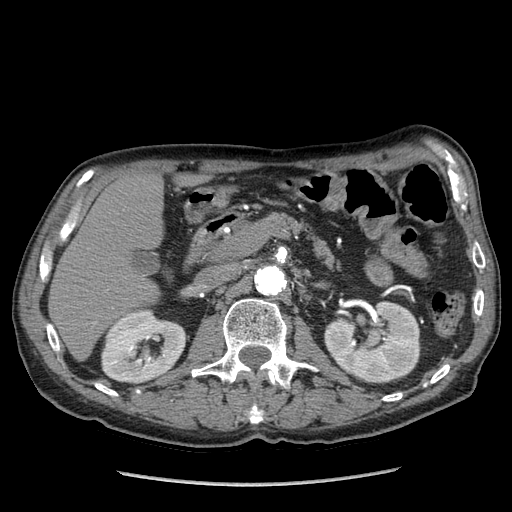
[im 202/245  soft-tissue]
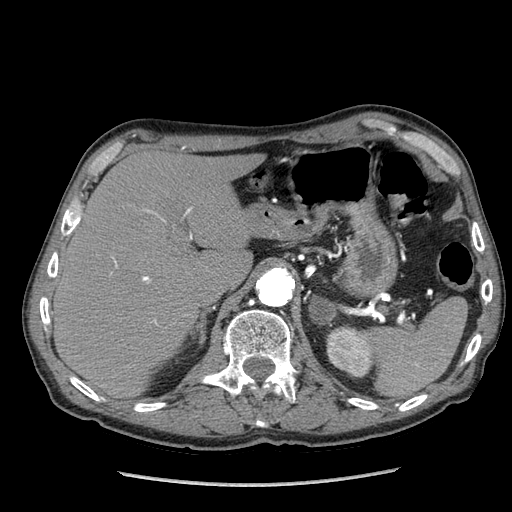
[im 202/245  lung]
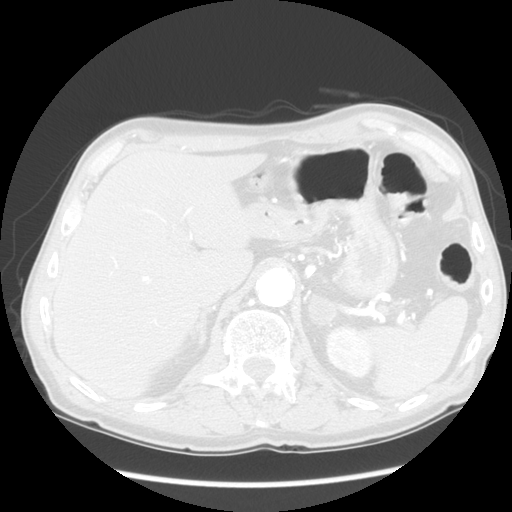
[im 202/245  bone]
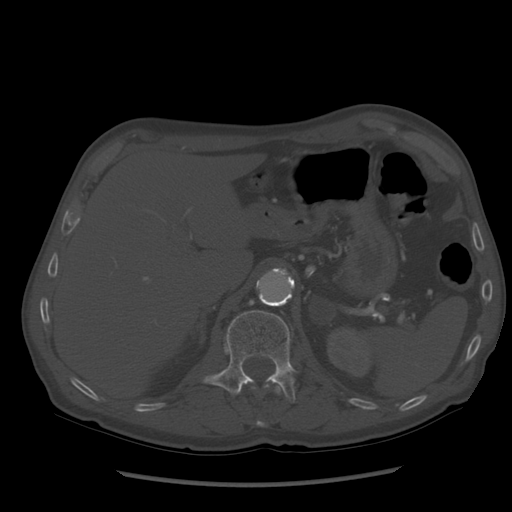
[im 213/245  lung]
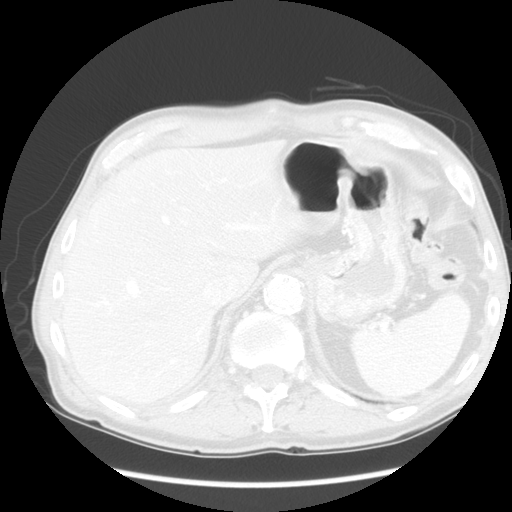
[im 223/245  soft-tissue]
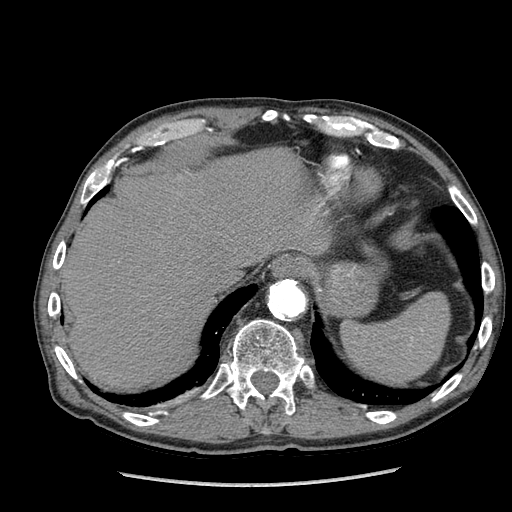
[im 223/245  lung]
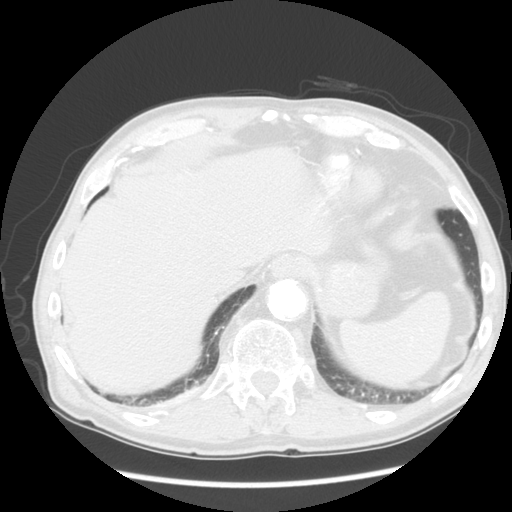
[im 234/245  lung]
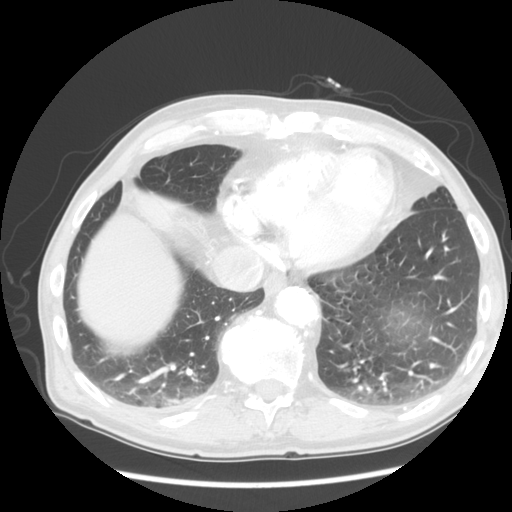

[Series 602: sagittal body · sagittal · 0.89mm/px · 3 of 145 slices shown]
[im 37/145  soft-tissue]
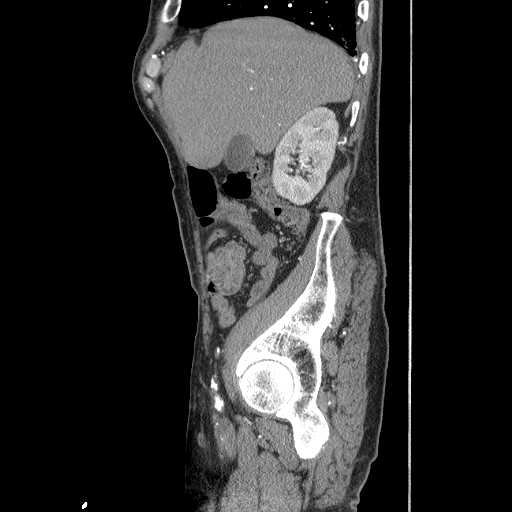
[im 73/145  soft-tissue]
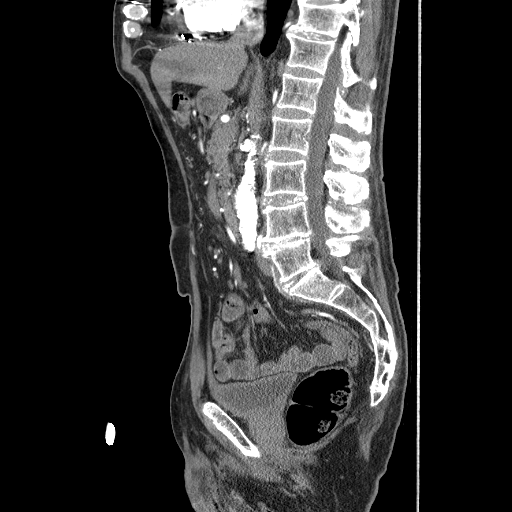
[im 109/145  soft-tissue]
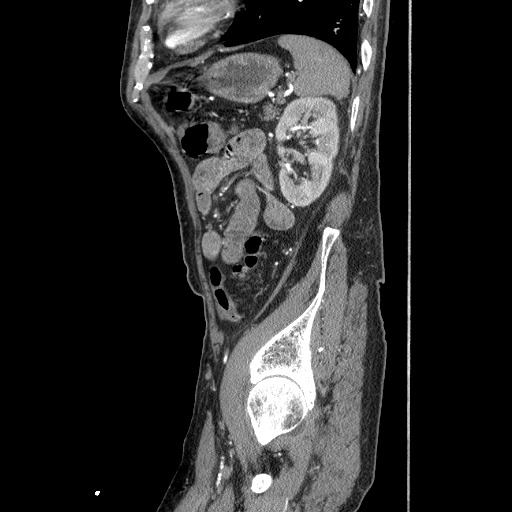

[15 of 46 positions shown; findings below may reference images not displayed]

FINDINGS: Again appreciated is heavy atherosclerotic calcification
of the abdominal aorta.  Mild dilatation of the infrarenal
abdominal aorta as noted previously.  No frank aneurysm.  The aorta
at that site measures 2.3 x 2.4 cm in diameter.  Bilateral renal
and common iliac artery stents with patency.  Again appreciated is
marked stenosis of the origin of the celiac axis with post stenotic
dilatation.  Patency of the hepatic and splenic arteries.  Focal
stenosis involving the proximal aspect of the common hepatic
artery.  Heavy calcification of the SMA.  No evidence of
significant stenosis involving the origin of the SMA. Stenotic
changes of a right SMA branch. The IMA is patent.  Marked stenosis
of the origin of the IMA. Normal size and contours of the kidneys.
Stable left adrenal mass measuring 2.0 x 2.4 cm, most likely an
adenoma.
IMPRESSION: Marked stenosis of the origins of the celiac axis and IMA.
Extensive atherosclerotic changes of the SMA.  No evidence of SMA
ostial stenosis.

CTA PELVIS
FINDINGS: Ectatic common iliac arteries. Patent common iliac artery
stents.  Patent graft is anastomosing with the left common femoral
artery.  Internal iliac branches are patent.
IMPRESSION: None stenotic iliofemoral arteries. Patent iliac stents.
Atherosclerotic changes.

## 2009-06-03 ENCOUNTER — Emergency Department (HOSPITAL_COMMUNITY): Admission: EM | Admit: 2009-06-03 | Discharge: 2009-06-03 | Payer: Self-pay | Admitting: Emergency Medicine

## 2009-06-03 ENCOUNTER — Ambulatory Visit: Payer: Self-pay | Admitting: Vascular Surgery

## 2009-06-12 IMAGING — CR DG CHEST 2V
3 series · 3 of 3 positions shown · non-contrast
Comparison: 11/01/2005

CLINICAL DATA: Pre admit for mesenteric bypass

CHEST - 2 VIEW

[view not recorded (1 of 3)]
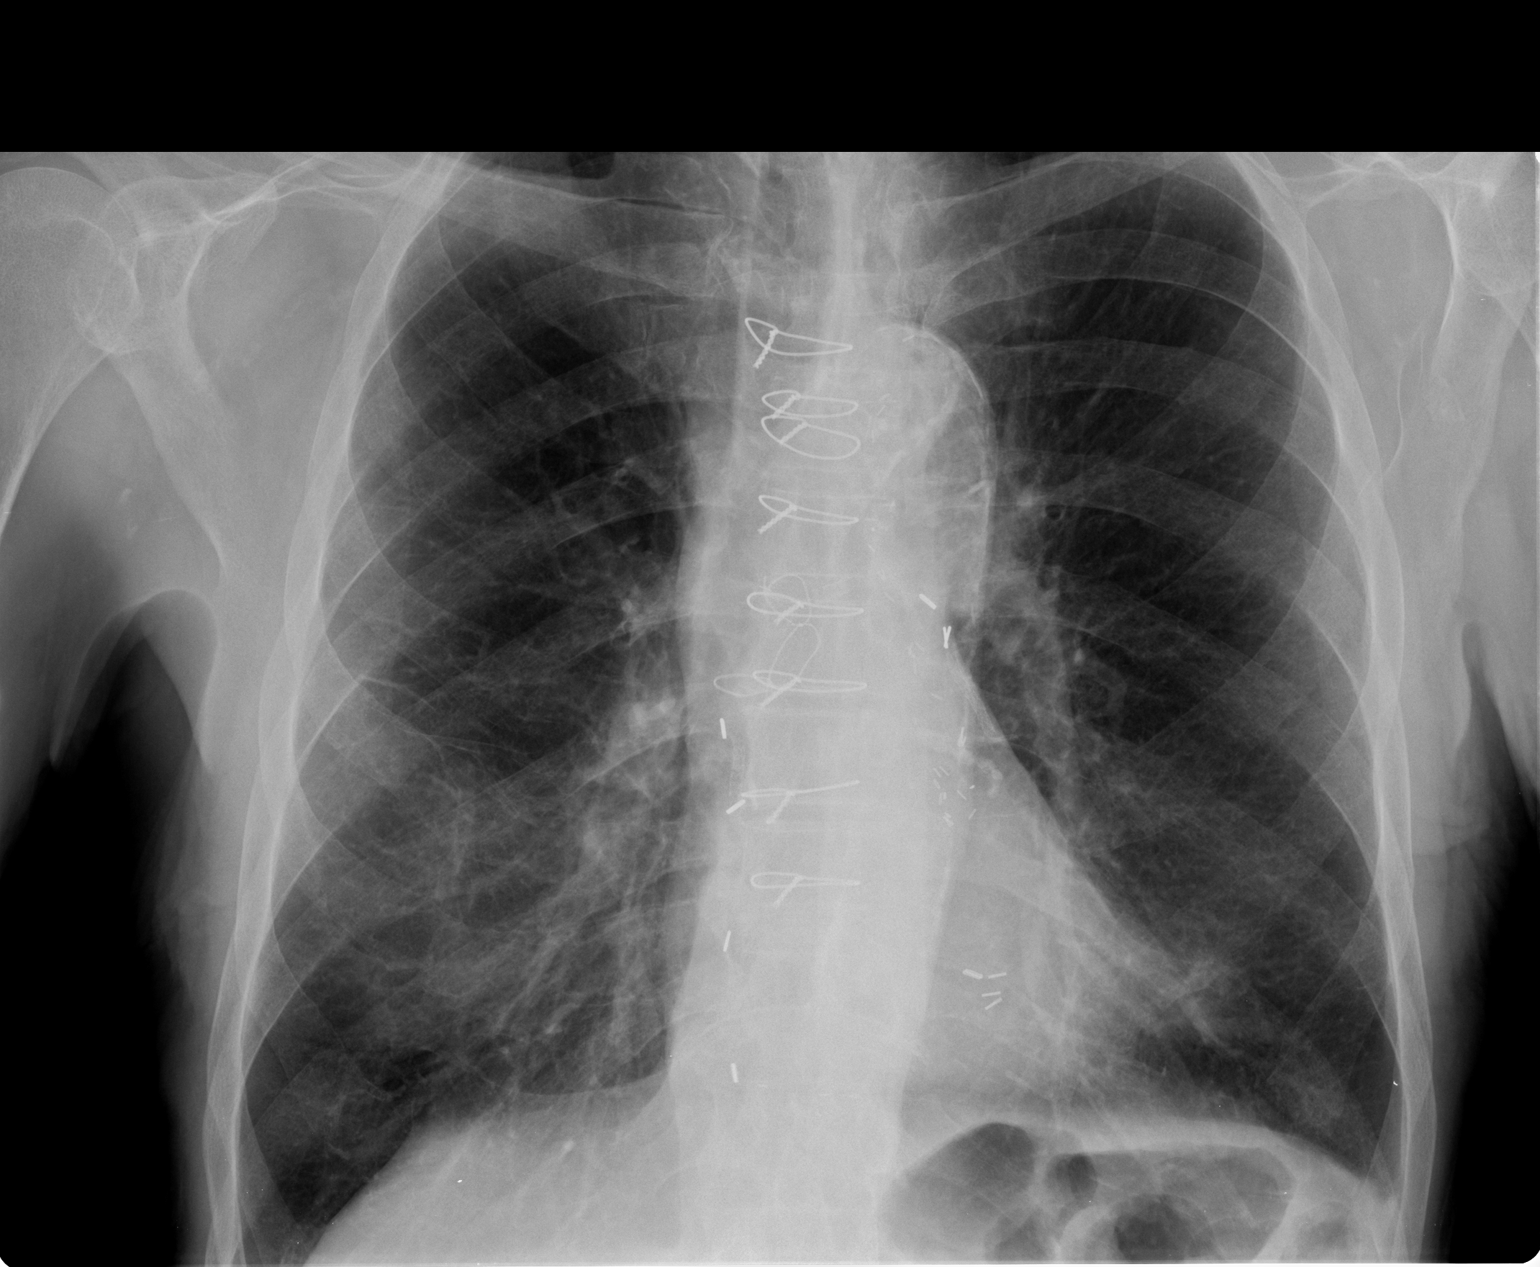

[view not recorded (2 of 3)]
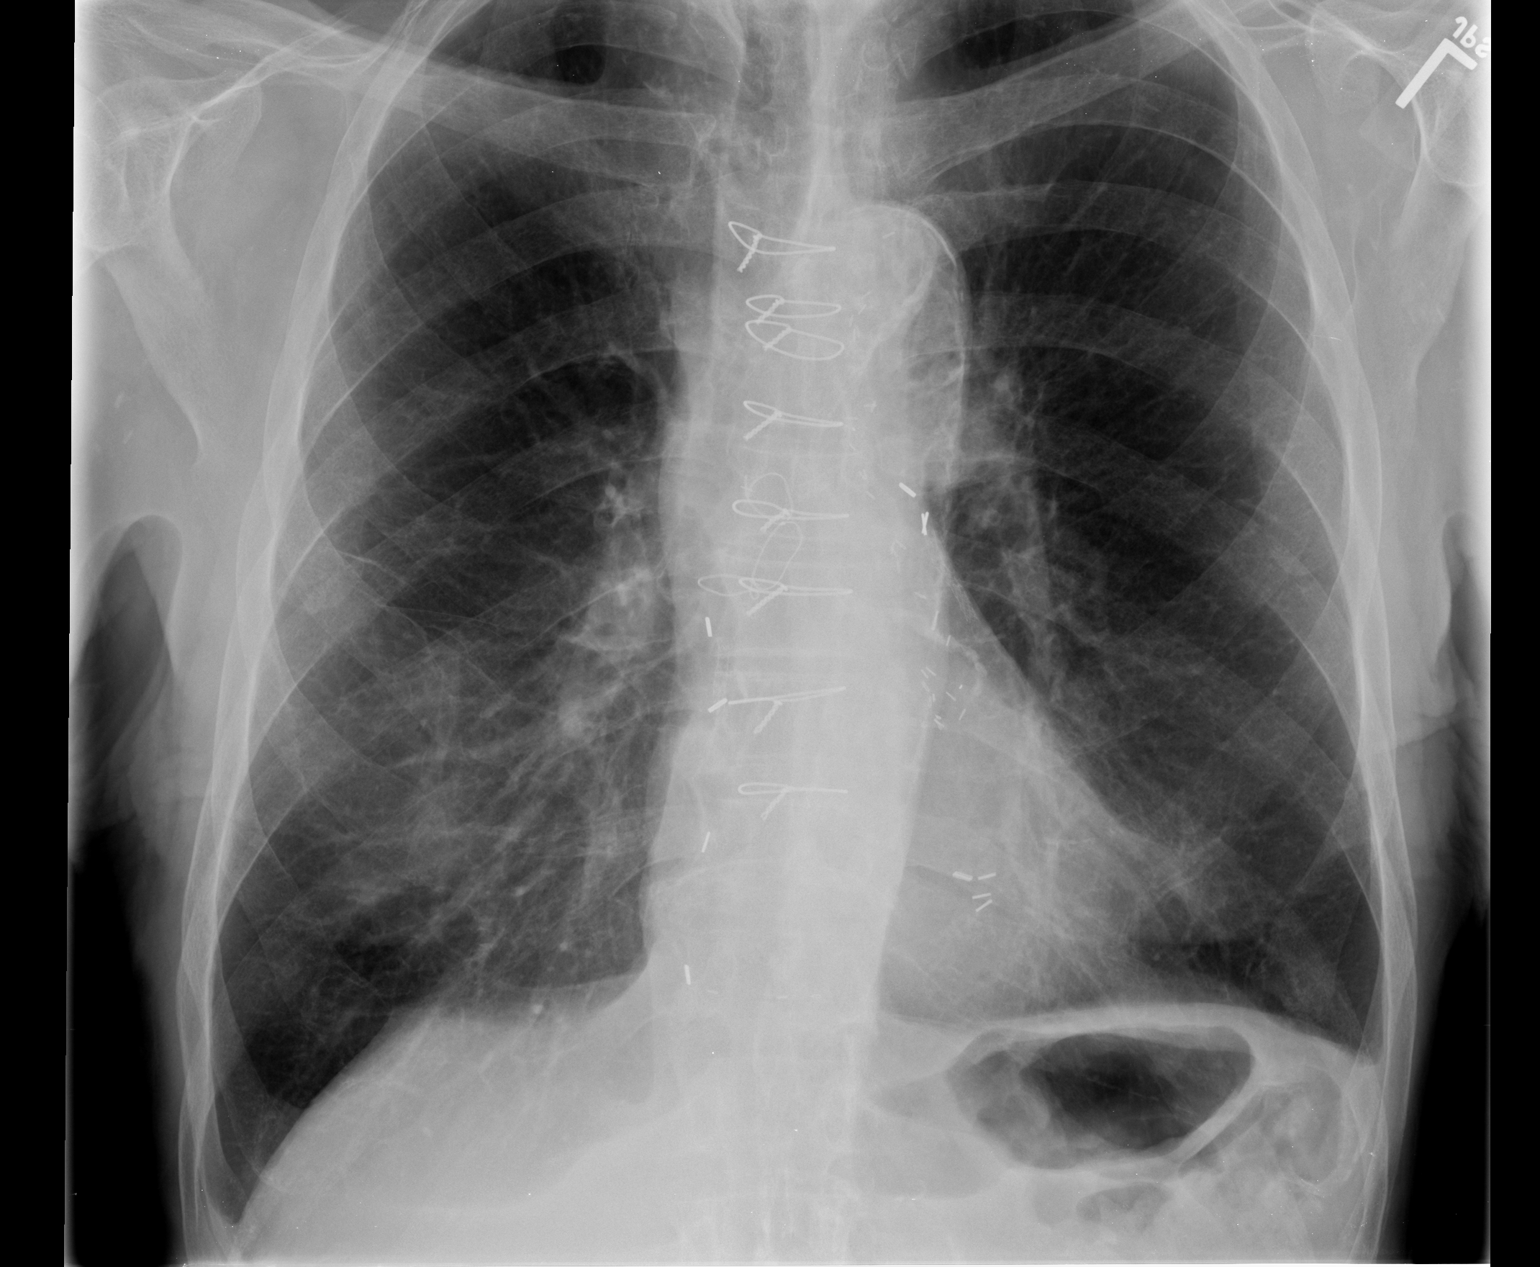

[view not recorded (3 of 3)]
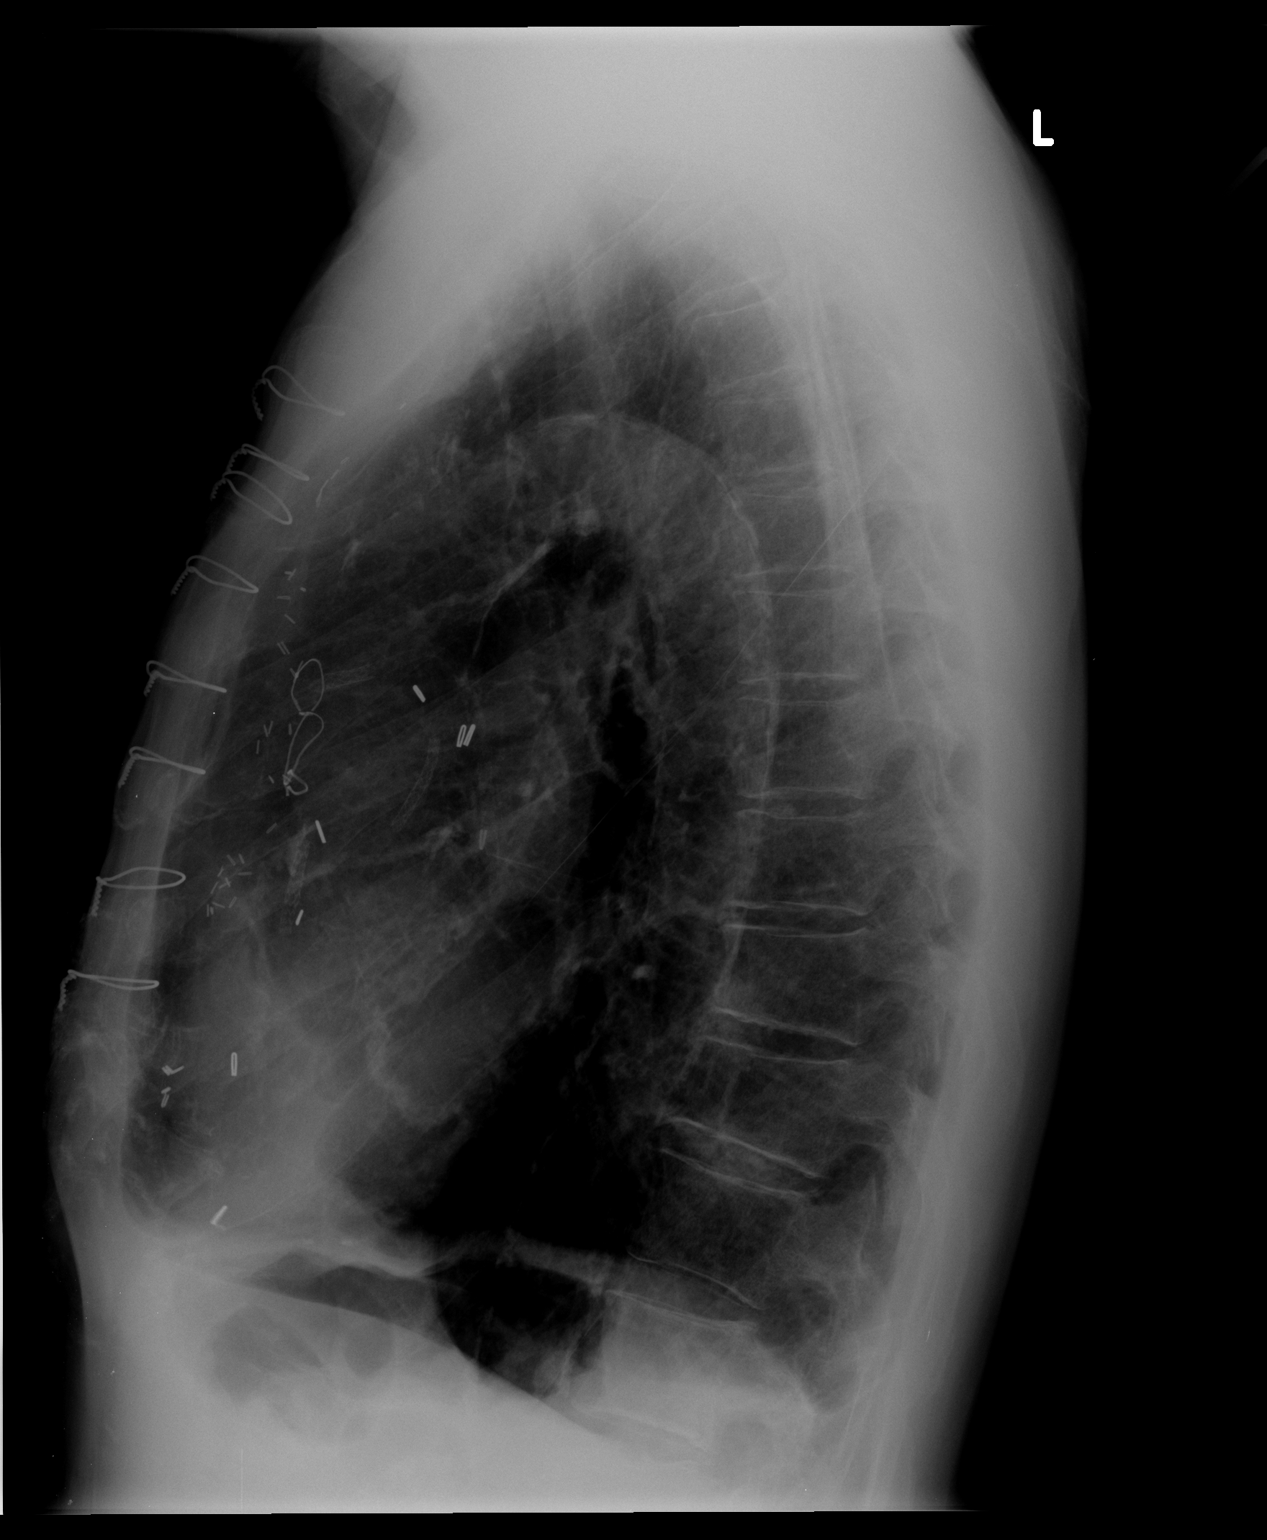

[3 of 3 positions shown; findings below may reference images not displayed]

FINDINGS: Heart size normal.  Prior CABG.  There are coronary
arteries stents noted.  There is heavy aortic calcification.

There are changes of COPD with no acute process.  Osseous
structures intact.
IMPRESSION: 1.  Status post CABG and coronary artery stenting.
2.  Heavy aortic calcification.
3.  COPD - no active disease.

## 2009-06-14 IMAGING — CR DG CHEST 1V PORT
1 series · 1 of 1 positions shown · non-contrast
Comparison: 09/16/2008

CLINICAL DATA: Status post surgery for mesenteric ischemia.  Swan-
Ganz catheter placement.

PORTABLE CHEST - 1 VIEW at 9667 hours:

[view not recorded]
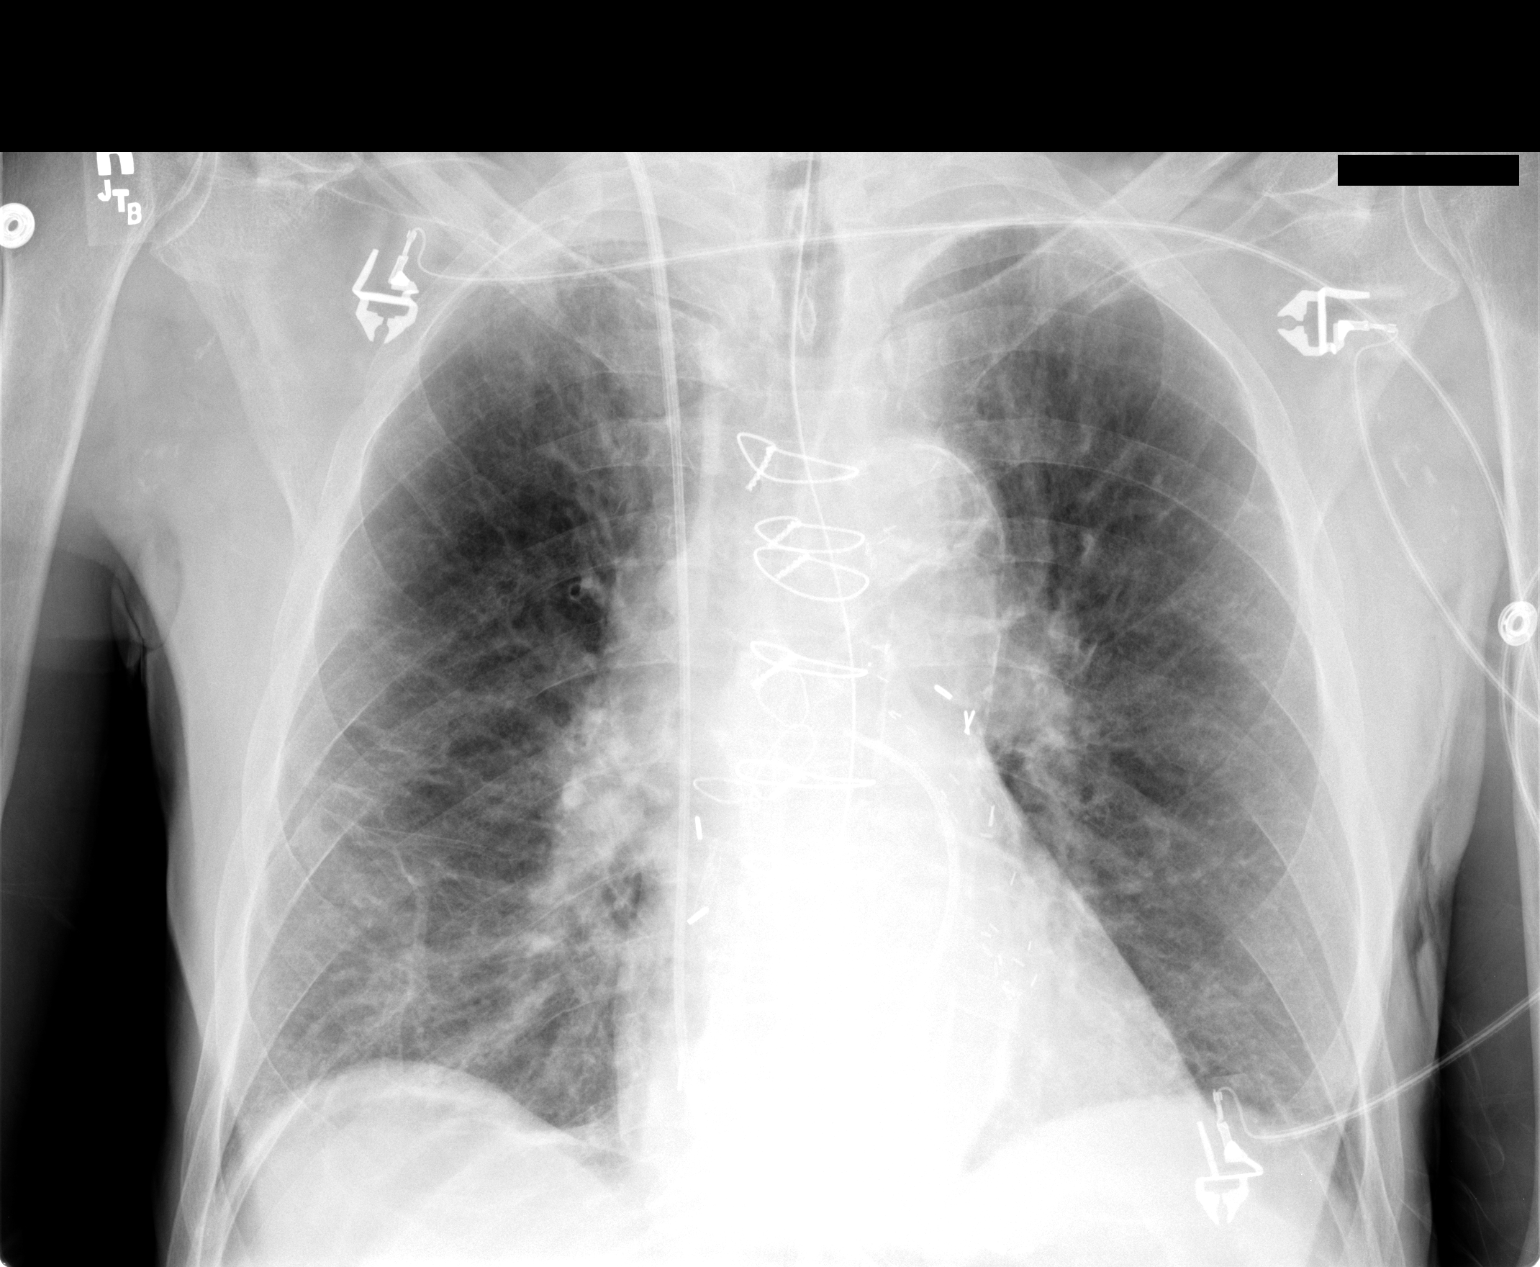

[1 of 1 positions shown; findings below may reference images not displayed]

FINDINGS: Swan-Ganz catheter via right jugular vein approach is
noted with tip in the proximal aspect of the right main pulmonary
artery.  No pneumothorax.  Pulmonary vascular congestion without
edema.  NG tube is noted traversing esophagus.
IMPRESSION: Specifically, the SGC is in the proximal right main pulmonary
artery segment.  No pneumothorax.

## 2009-06-15 IMAGING — CR DG CHEST 1V PORT
1 series · 1 of 1 positions shown · non-contrast
Comparison: Same day

CLINICAL DATA: PICC placement

PORTABLE CHEST - 1 VIEW

[view not recorded]
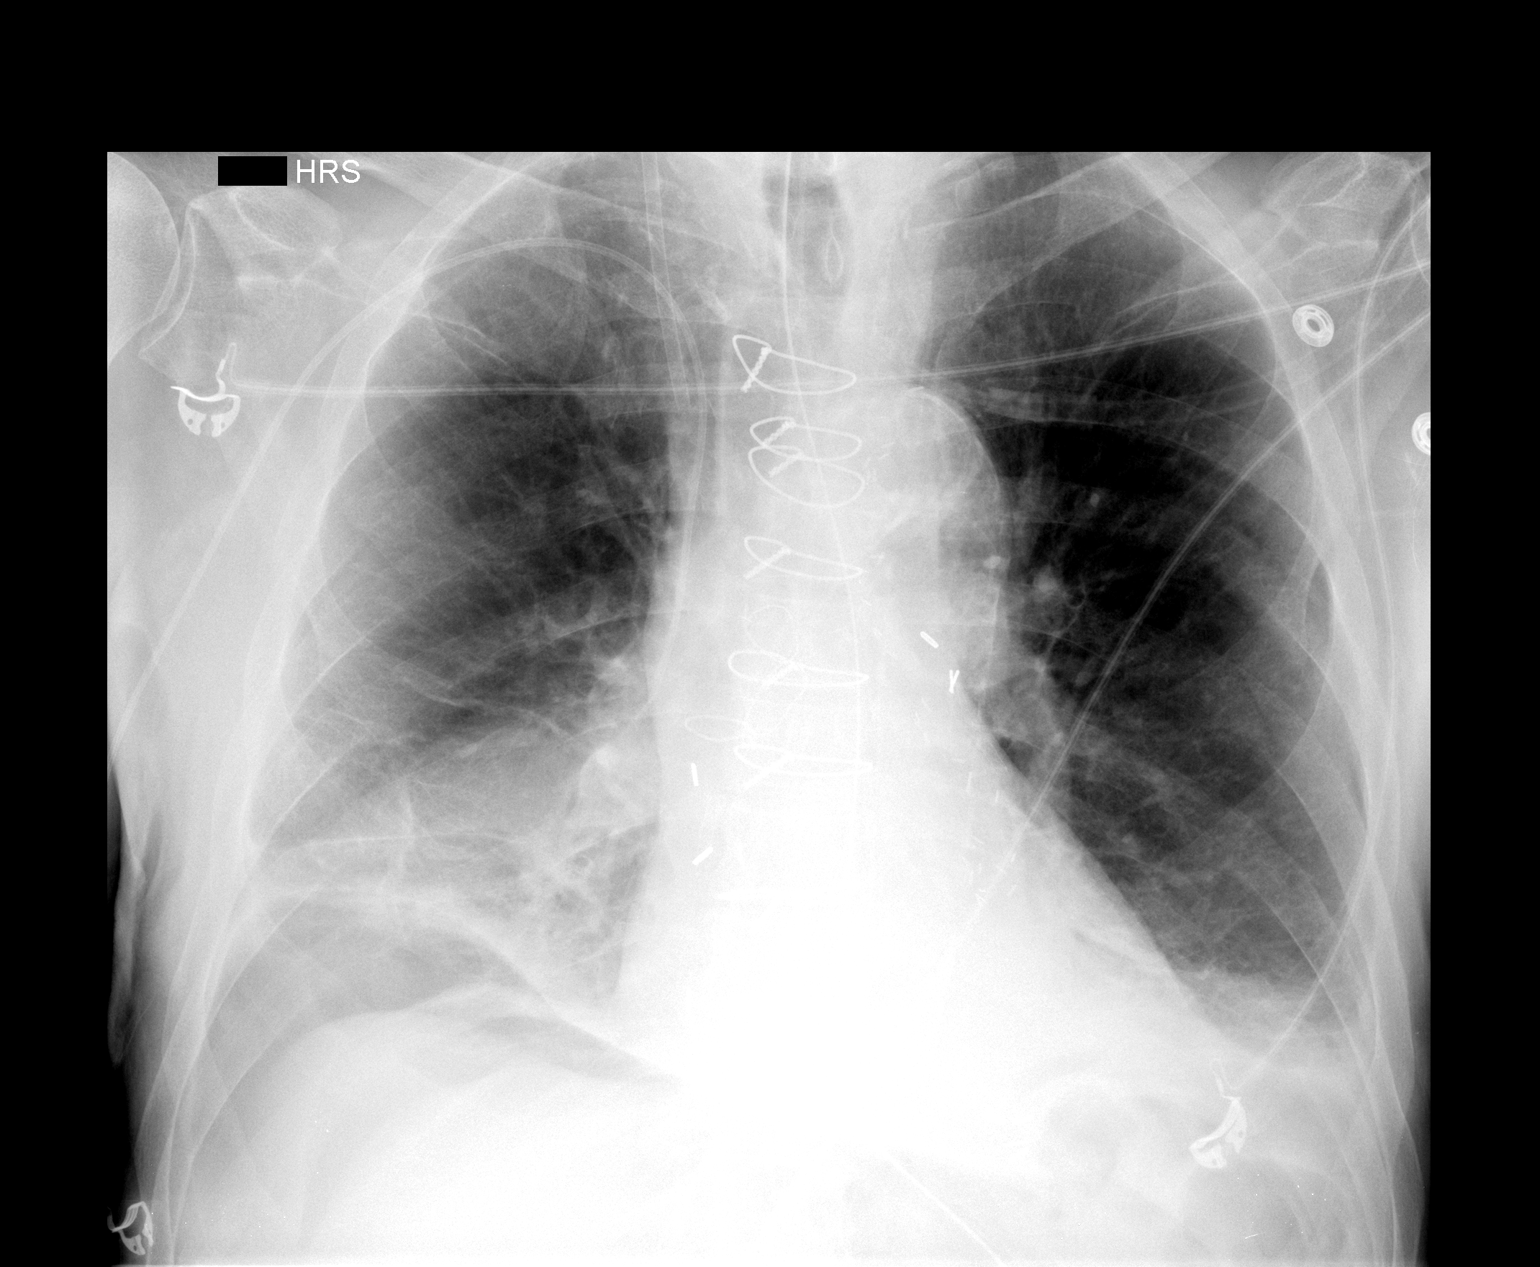

[1 of 1 positions shown; findings below may reference images not displayed]

FINDINGS: Right arm PICC is in place.  The tip is in the SVC 2 cm
above the right atrium.  Destin catheters been removed with the
right internal jugular venous access sheath remains in place.
Nasogastric tube continues in the abdomen.  Atelectasis persists
and both lower lobes.  Free intraperitoneal air again evident.
IMPRESSION: PICC tip in the SVC 2 cm above the right atrium.

Swan-Ganz catheter removed.

Lower lobe atelectasis persists.

Free intraperitoneal air remains evident.

## 2009-08-05 ENCOUNTER — Ambulatory Visit: Payer: Self-pay | Admitting: Vascular Surgery

## 2009-08-11 IMAGING — CR DG CHEST 1V PORT
2 series · 2 of 2 positions shown · non-contrast
Comparison: Portable chest x-ray of 09/19/2008

CLINICAL DATA: Shortness of breath, mid chest pain

PORTABLE CHEST - 1 VIEW

[view not recorded (1 of 2)]
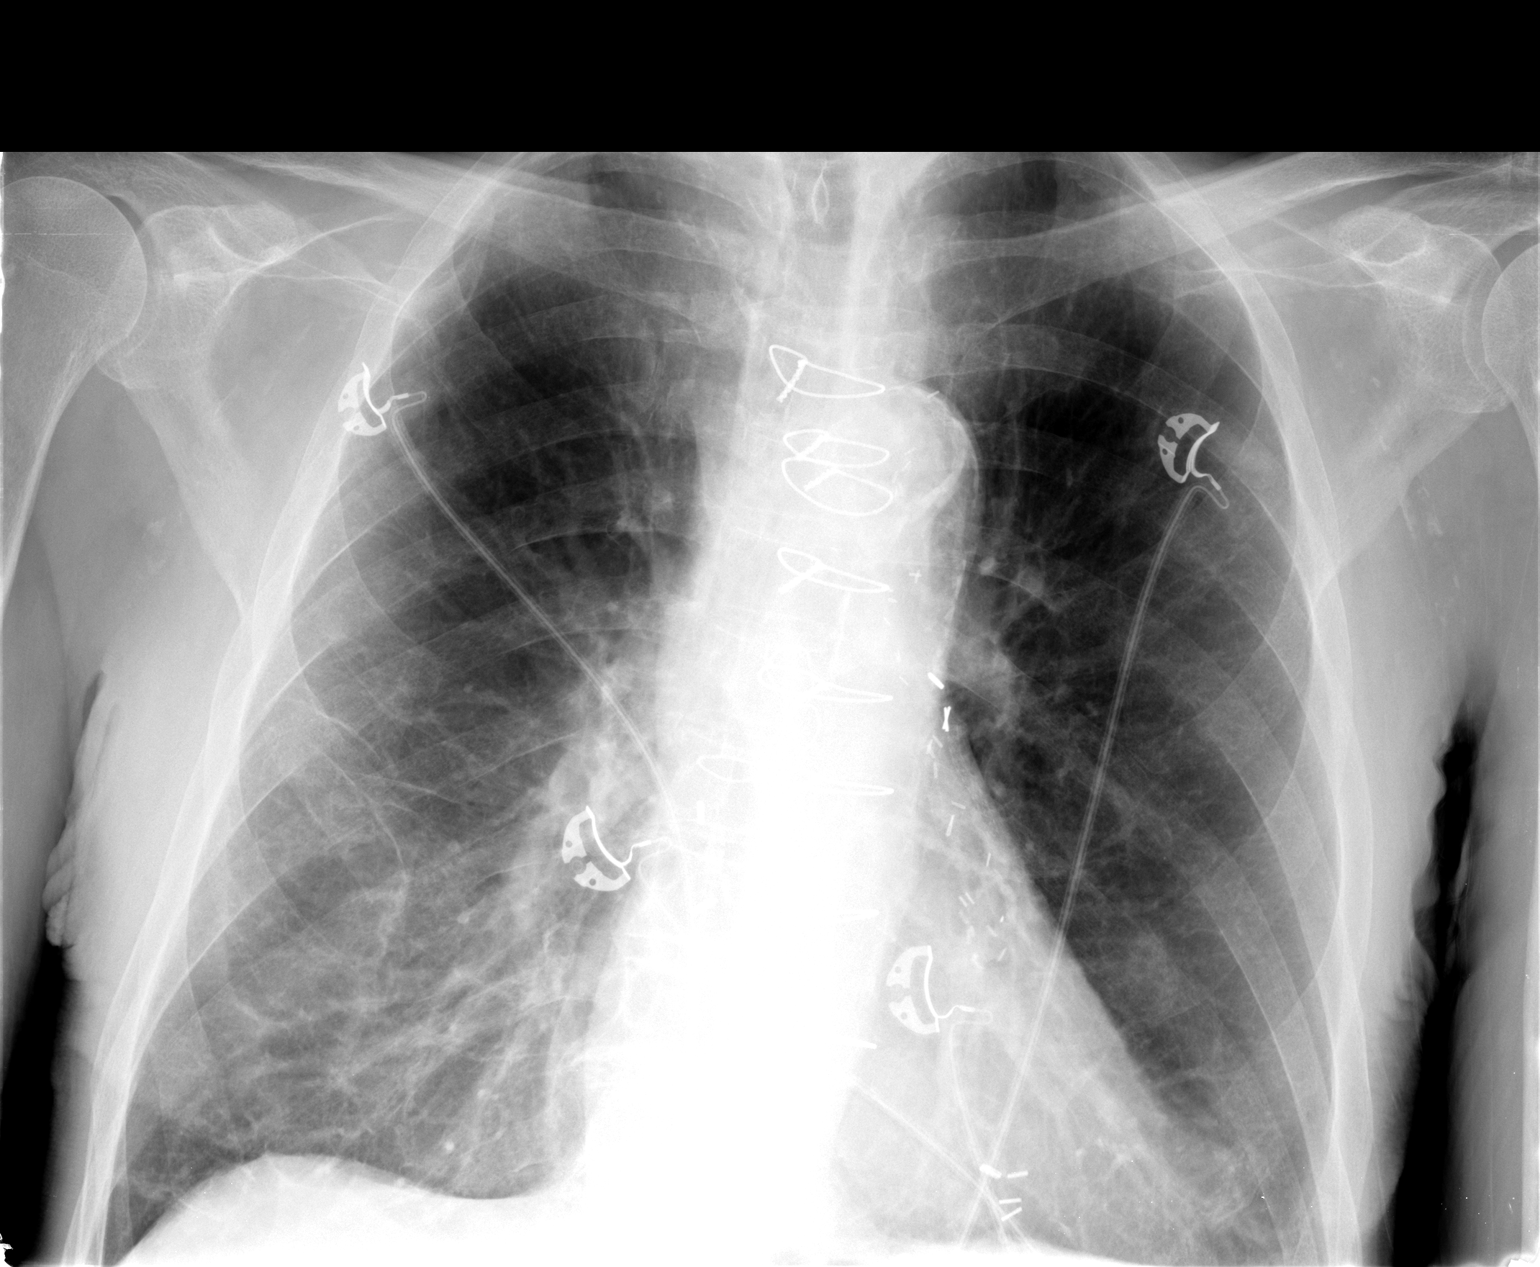

[view not recorded (2 of 2)]
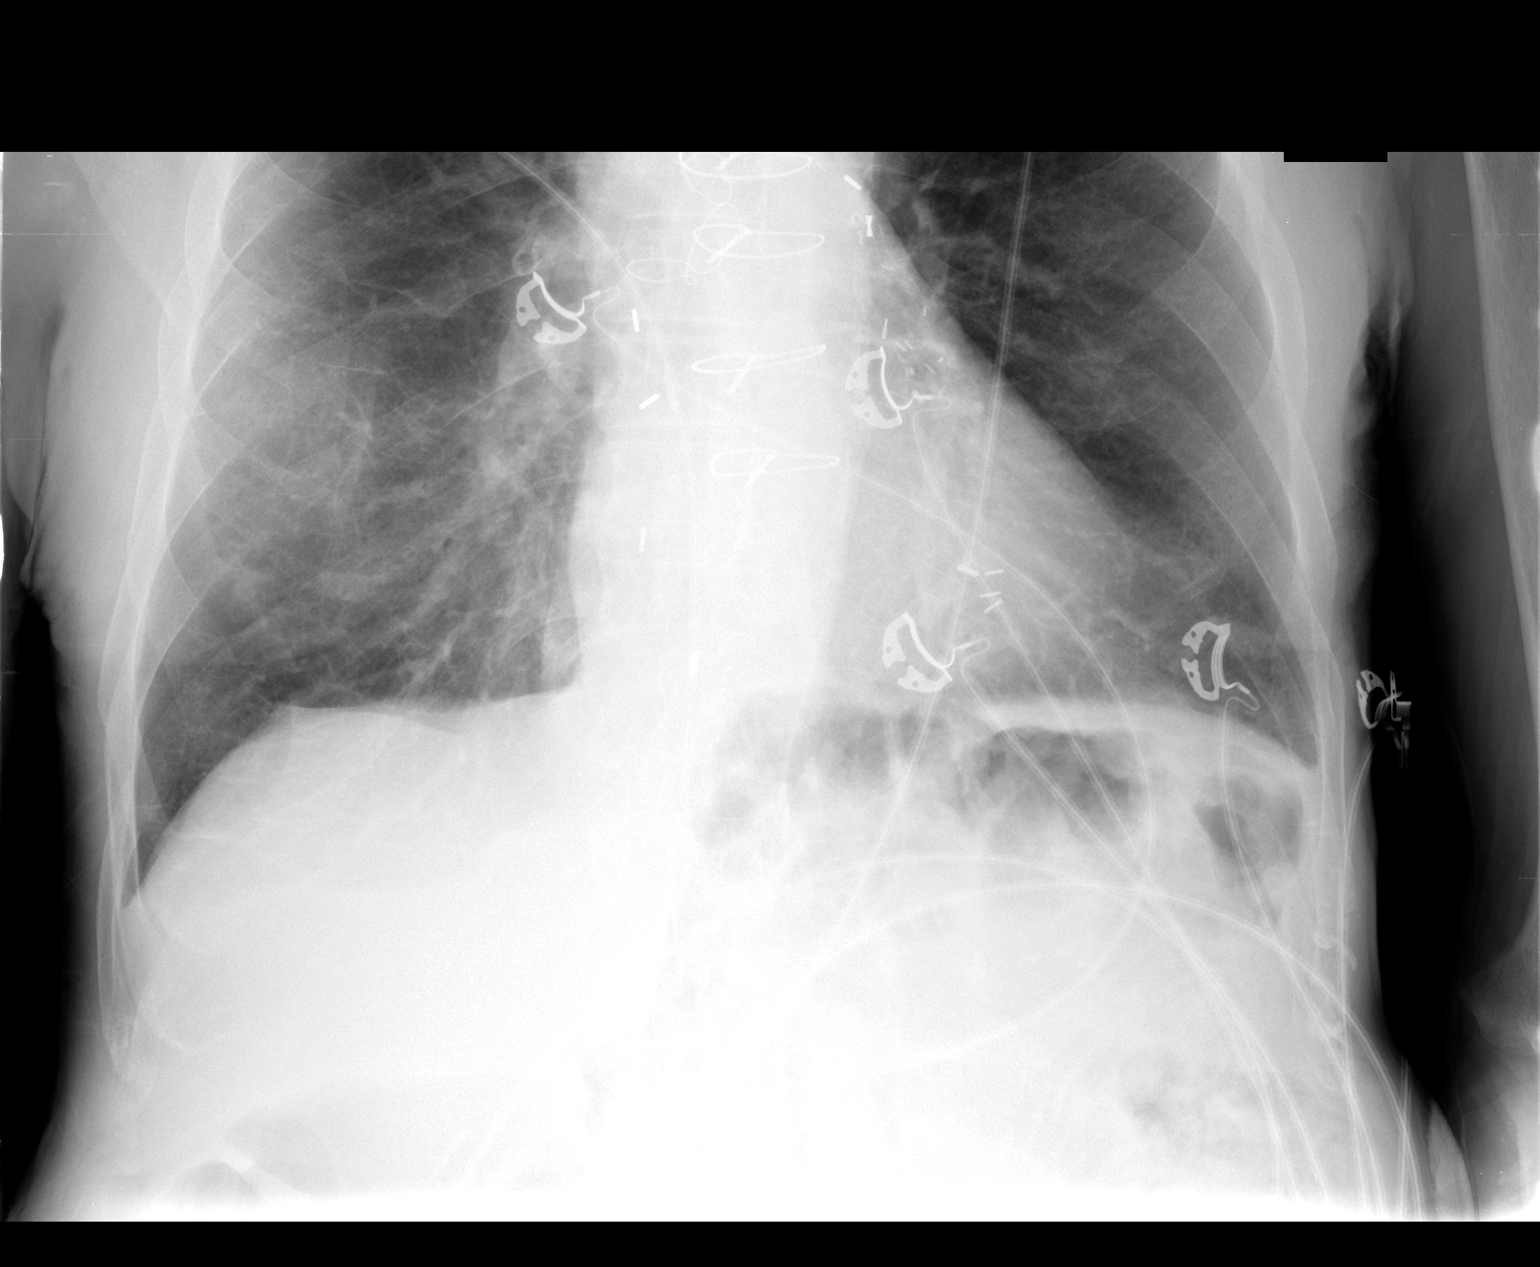

[2 of 2 positions shown; findings below may reference images not displayed]

FINDINGS: The lungs are hyperaerated consistent with COPD.  The
heart is mildly enlarged.  Median sternotomy sutures are noted from
prior CABG.  No acute bony abnormality is seen.
IMPRESSION: COPD.  No active lung disease.

## 2009-08-14 HISTORY — PX: CORONARY ANGIOPLASTY WITH STENT PLACEMENT: SHX49

## 2009-09-16 ENCOUNTER — Ambulatory Visit: Payer: Self-pay | Admitting: Vascular Surgery

## 2009-10-01 ENCOUNTER — Inpatient Hospital Stay (HOSPITAL_COMMUNITY): Admission: RE | Admit: 2009-10-01 | Discharge: 2009-10-03 | Payer: Self-pay | Admitting: Vascular Surgery

## 2009-10-01 ENCOUNTER — Ambulatory Visit: Payer: Self-pay | Admitting: Vascular Surgery

## 2009-10-01 ENCOUNTER — Encounter: Payer: Self-pay | Admitting: Vascular Surgery

## 2009-10-01 IMAGING — CR DG CHEST 2V
2 series · 2 of 2 positions shown · non-contrast
Comparison: 11/15/2008

CLINICAL DATA: Peripheral vascular disease.  Hypertension.

CHEST - 2 VIEW

[view not recorded (1 of 2)]
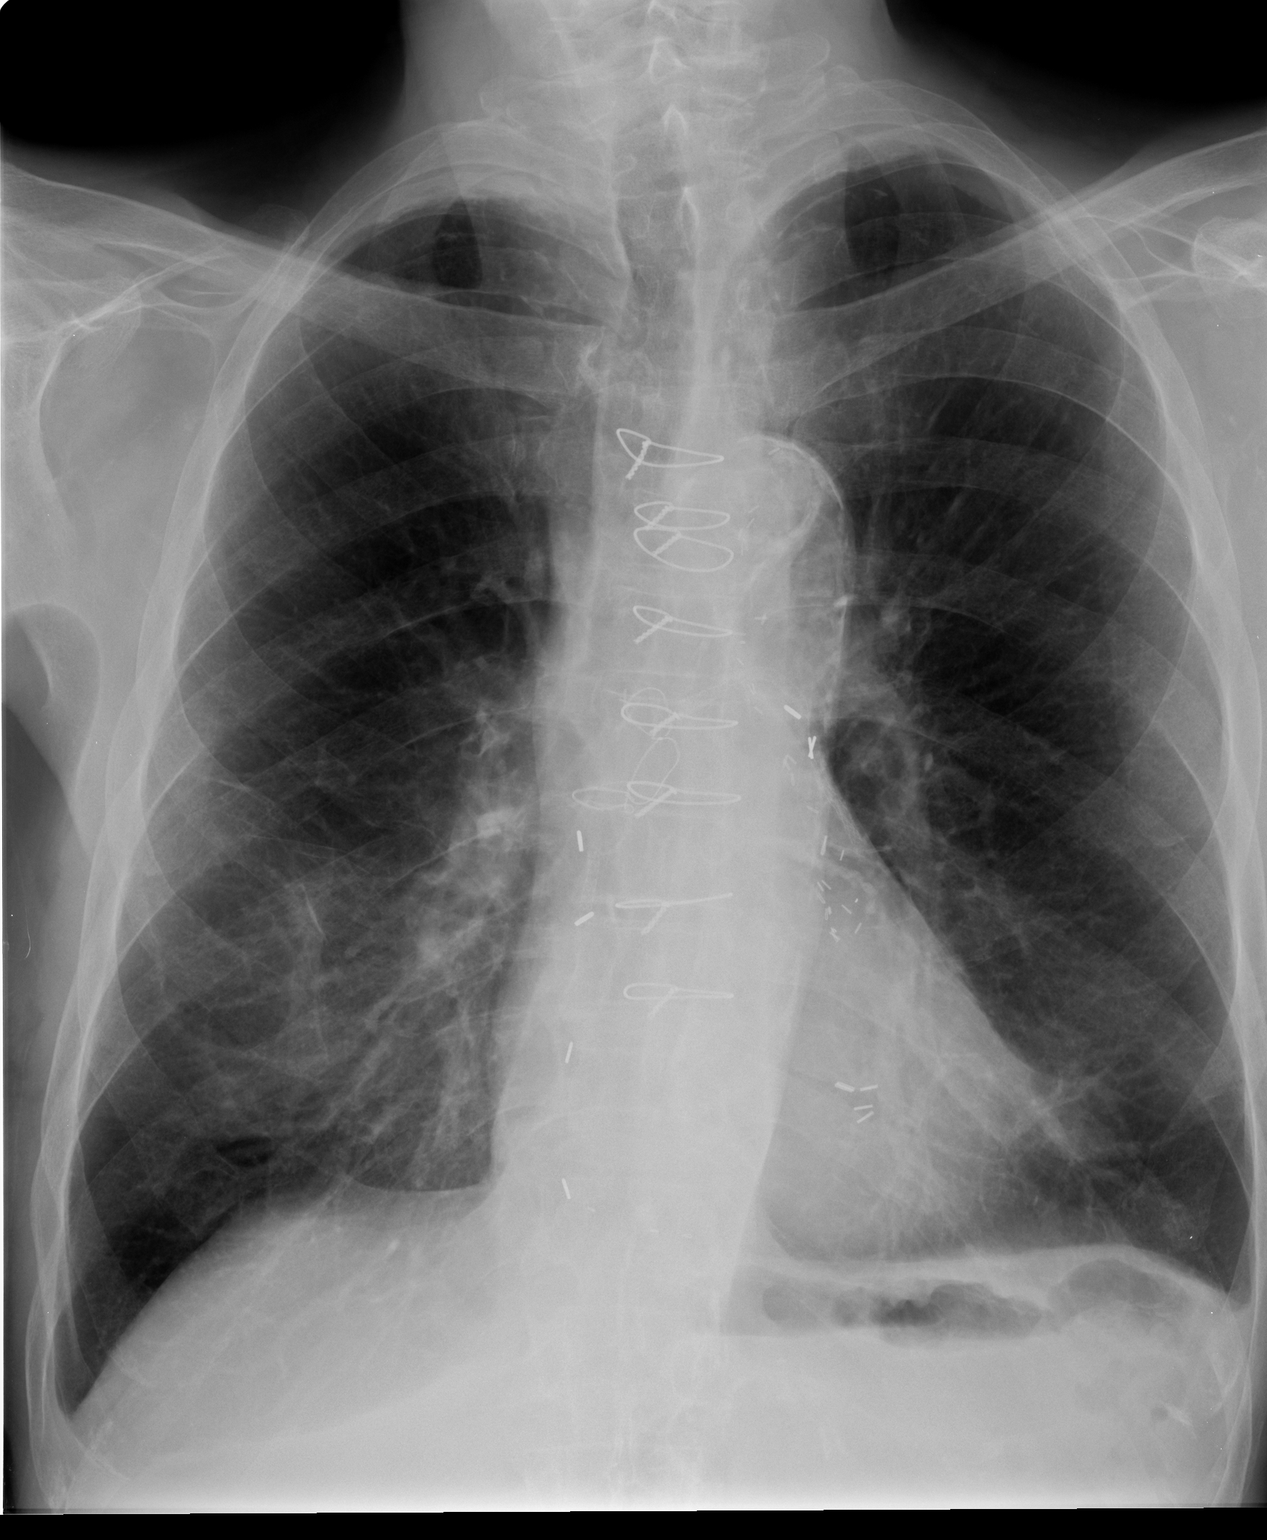

[view not recorded (2 of 2)]
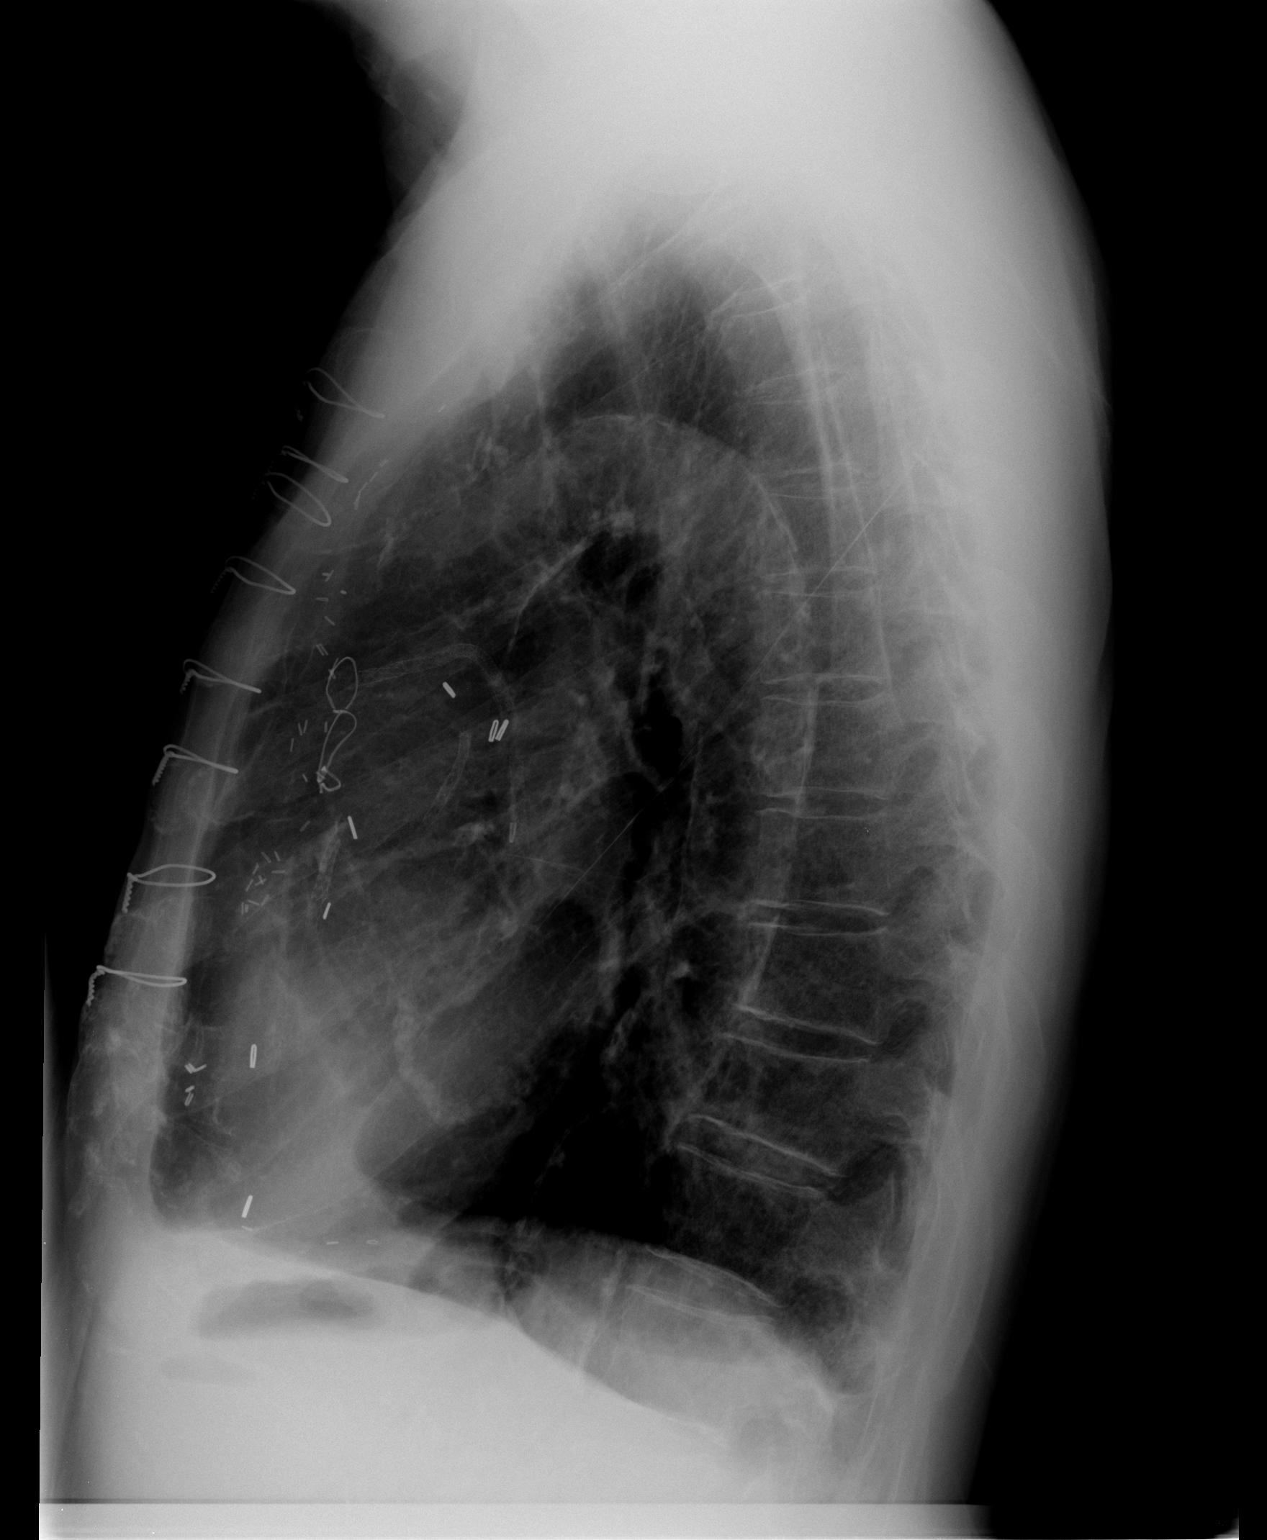

[2 of 2 positions shown; findings below may reference images not displayed]

FINDINGS: Interstitial densities within the mid and lower lobes
bilaterally are chronic.  Negative for edema.  Heart and
mediastinal contours are normal.  Atherosclerotic changes are seen
within the aorta.  Diffuse osteopenia without compression fracture
deformity.
IMPRESSION: Chronic interstitial changes.

## 2009-10-12 ENCOUNTER — Telehealth: Payer: Self-pay | Admitting: Internal Medicine

## 2009-10-21 ENCOUNTER — Ambulatory Visit: Payer: Self-pay | Admitting: Vascular Surgery

## 2009-11-10 ENCOUNTER — Ambulatory Visit: Payer: Self-pay | Admitting: Internal Medicine

## 2009-11-14 IMAGING — CR DG CHEST 2V
3 series · 3 of 3 positions shown · non-contrast
Comparison: 01/05/2009 and earlier.

CLINICAL DATA: 74-year-old male with cough and shortness of breath
for 1 week.  Smoker.

CHEST - 2 VIEW

[view not recorded (1 of 3)]
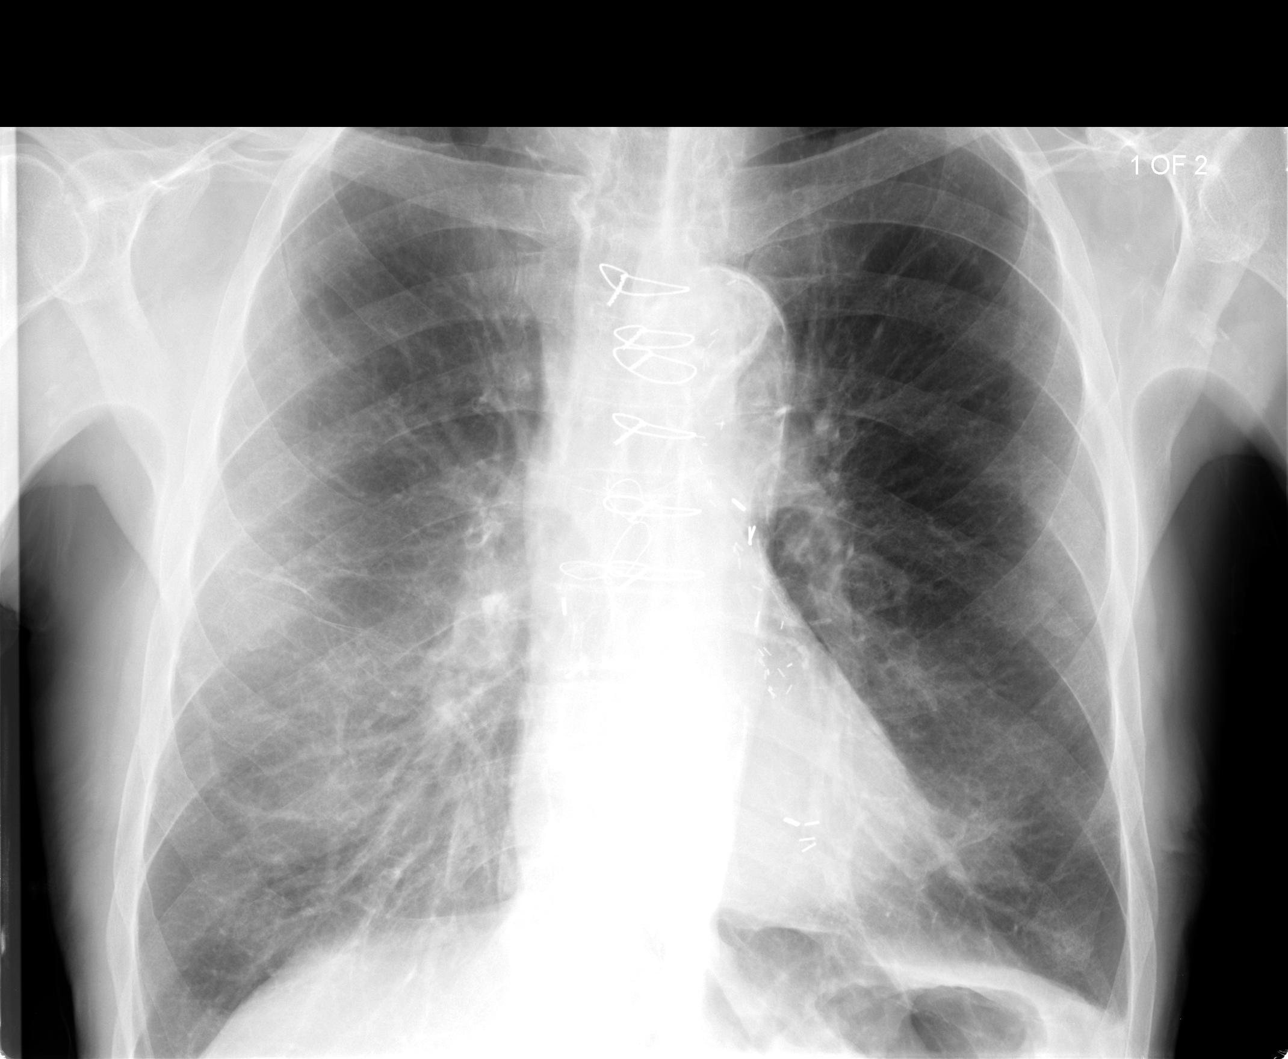

[view not recorded (2 of 3)]
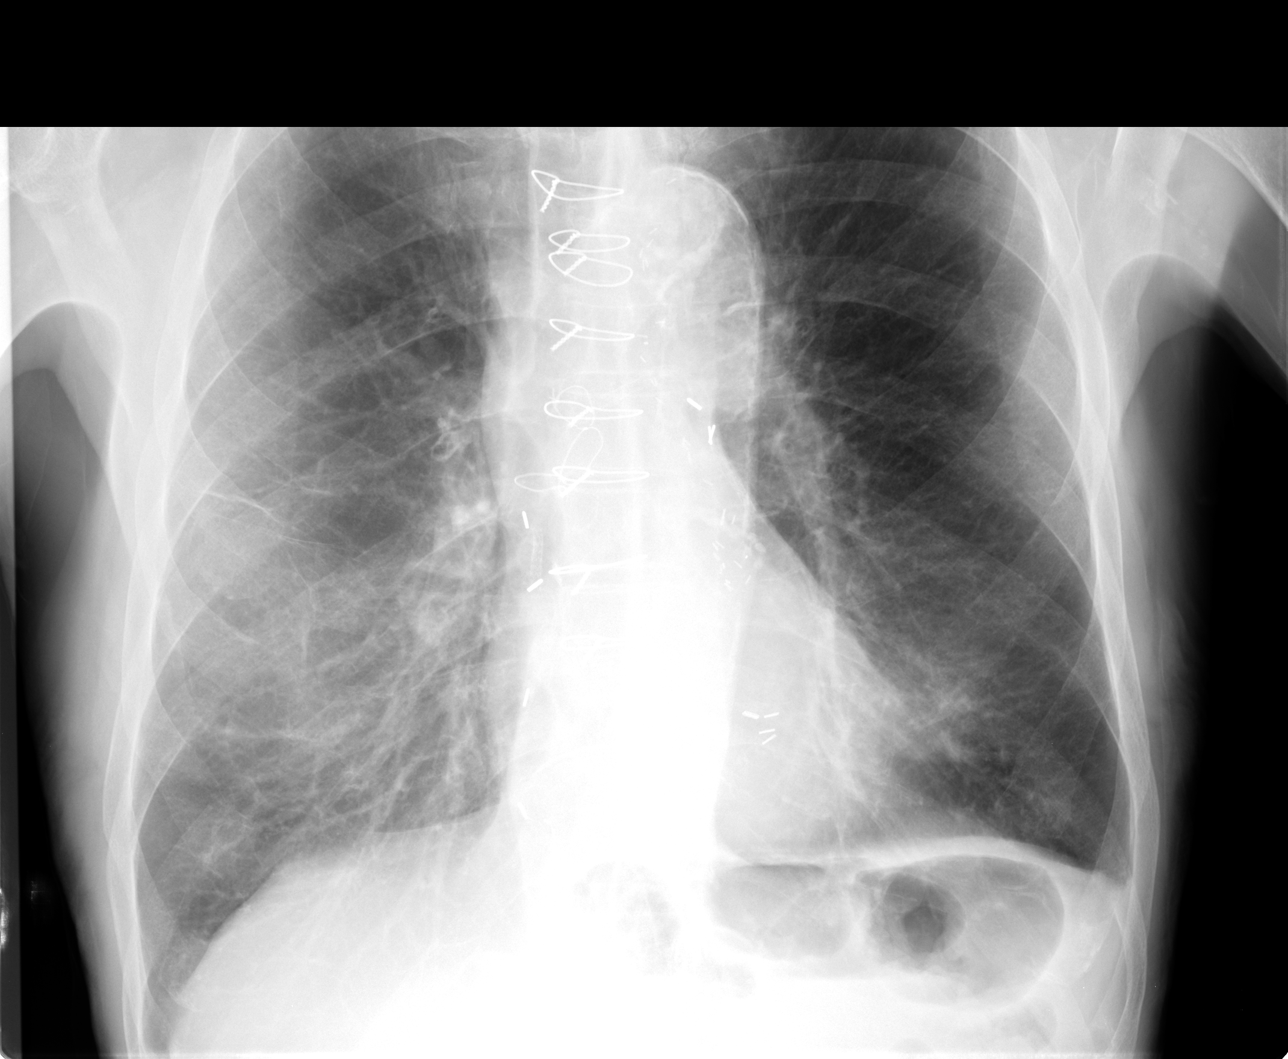

[view not recorded (3 of 3)]
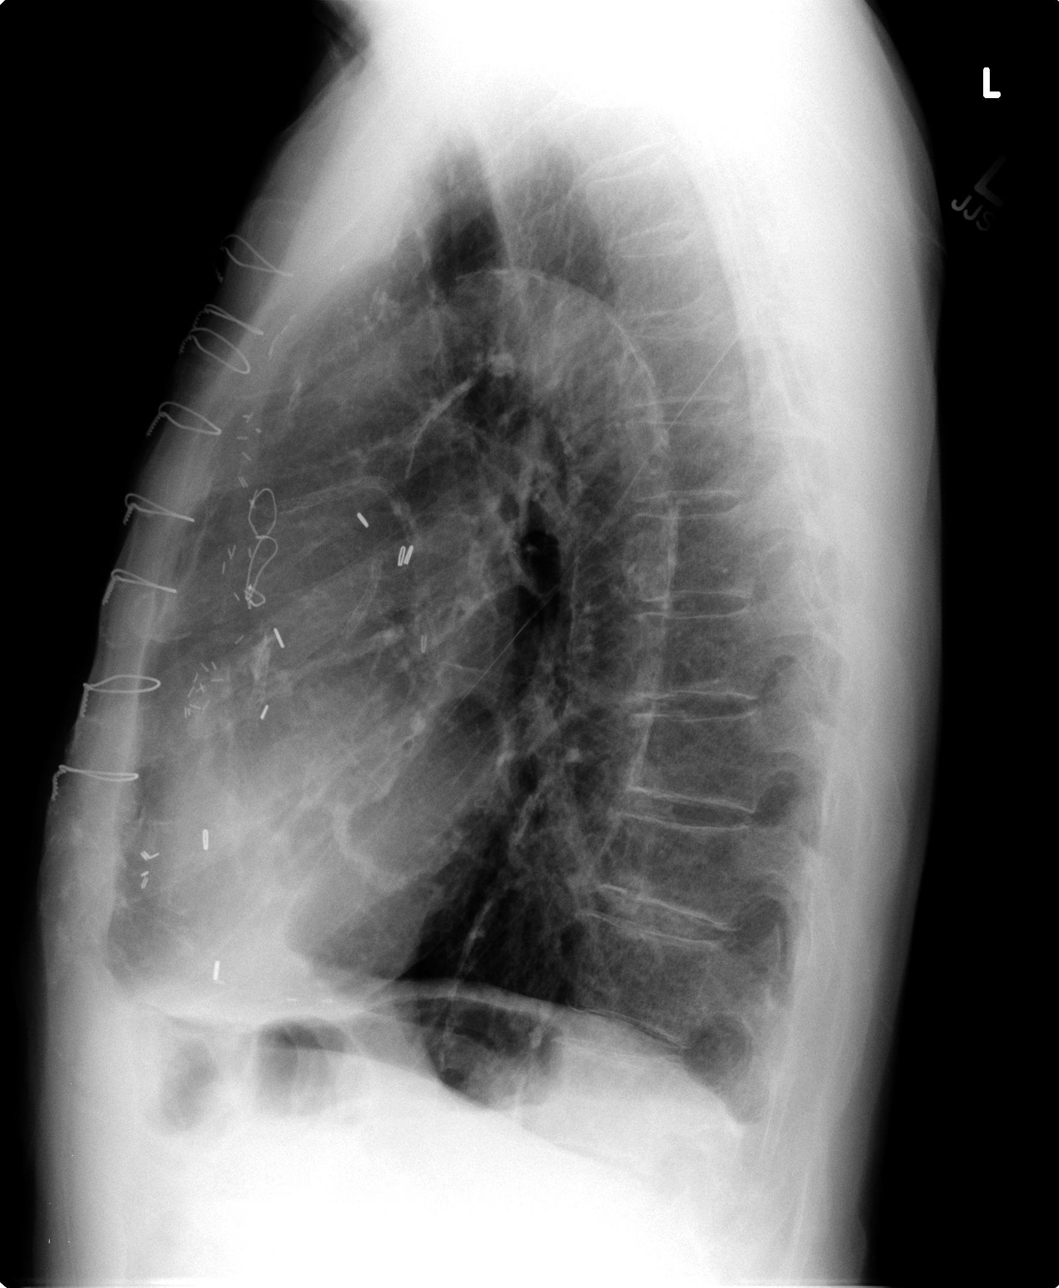

[3 of 3 positions shown; findings below may reference images not displayed]

FINDINGS: Stable cardiac size and mediastinal contours.  Stable
sequelae of CABG.  Hyperinflated lungs.  Biapical scarring, worse
on the right.  No pneumothorax, pulmonary edema, pleural effusion,
or consolidation.  Stable appearance of vague increased right
infrahilar opacity.  No confluent airspace disease is identified.
Stable visualized osseous structures.  Diffuse calcified
atherosclerosis of the aorta.
IMPRESSION: Stable chronic lung disease.  No acute cardiopulmonary abnormality.

## 2009-12-27 ENCOUNTER — Ambulatory Visit: Payer: Self-pay | Admitting: Vascular Surgery

## 2009-12-29 ENCOUNTER — Encounter: Payer: Self-pay | Admitting: Cardiovascular Disease

## 2009-12-29 ENCOUNTER — Ambulatory Visit: Payer: Self-pay | Admitting: Cardiovascular Disease

## 2009-12-29 ENCOUNTER — Inpatient Hospital Stay (HOSPITAL_COMMUNITY): Admission: EM | Admit: 2009-12-29 | Discharge: 2009-12-31 | Payer: Self-pay | Admitting: Emergency Medicine

## 2010-01-03 ENCOUNTER — Ambulatory Visit: Payer: Self-pay | Admitting: Internal Medicine

## 2010-01-03 DIAGNOSIS — E538 Deficiency of other specified B group vitamins: Secondary | ICD-10-CM | POA: Insufficient documentation

## 2010-01-03 HISTORY — DX: Deficiency of other specified B group vitamins: E53.8

## 2010-01-05 ENCOUNTER — Encounter: Payer: Self-pay | Admitting: Internal Medicine

## 2010-01-05 ENCOUNTER — Encounter (INDEPENDENT_AMBULATORY_CARE_PROVIDER_SITE_OTHER): Payer: Self-pay | Admitting: *Deleted

## 2010-01-05 LAB — CONVERTED CEMR LAB
Eosinophils Relative: 8.1 % — ABNORMAL HIGH (ref 0.0–5.0)
Folate: 5.5 ng/mL
Iron: 33 ug/dL — ABNORMAL LOW (ref 42–165)
Lymphocytes Relative: 20.1 % (ref 12.0–46.0)
MCV: 89 fL (ref 78.0–100.0)
Monocytes Absolute: 0.6 10*3/uL (ref 0.1–1.0)
Neutrophils Relative %: 64.8 % (ref 43.0–77.0)
Platelets: 197 10*3/uL (ref 150.0–400.0)
Saturation Ratios: 9.2 % — ABNORMAL LOW (ref 20.0–50.0)
Transferrin: 256 mg/dL (ref 212.0–360.0)
WBC: 9.9 10*3/uL (ref 4.5–10.5)

## 2010-01-06 ENCOUNTER — Ambulatory Visit: Payer: Self-pay | Admitting: Internal Medicine

## 2010-01-19 ENCOUNTER — Ambulatory Visit: Payer: Self-pay | Admitting: Internal Medicine

## 2010-01-20 LAB — CONVERTED CEMR LAB
Basophils Absolute: 0 10*3/uL (ref 0.0–0.1)
HCT: 30.6 % — ABNORMAL LOW (ref 39.0–52.0)
Lymphs Abs: 1.9 10*3/uL (ref 0.7–4.0)
MCV: 92.5 fL (ref 78.0–100.0)
Monocytes Absolute: 0.4 10*3/uL (ref 0.1–1.0)
Monocytes Relative: 6.5 % (ref 3.0–12.0)
Neutrophils Relative %: 59.2 % (ref 43.0–77.0)
Platelets: 224 10*3/uL (ref 150.0–400.0)
RDW: 22.4 % — ABNORMAL HIGH (ref 11.5–14.6)

## 2010-01-31 ENCOUNTER — Ambulatory Visit: Payer: Self-pay | Admitting: Internal Medicine

## 2010-01-31 DIAGNOSIS — R079 Chest pain, unspecified: Secondary | ICD-10-CM

## 2010-01-31 HISTORY — DX: Chest pain, unspecified: R07.9

## 2010-01-31 LAB — CONVERTED CEMR LAB
AST: 13 units/L (ref 0–37)
Albumin: 4.1 g/dL (ref 3.5–5.2)
Basophils Absolute: 0 10*3/uL (ref 0.0–0.1)
CO2: 26 meq/L (ref 19–32)
Calcium: 9.5 mg/dL (ref 8.4–10.5)
Chloride: 108 meq/L (ref 96–112)
HDL: 47.6 mg/dL (ref >39.00)
LDL Cholesterol: 73 mg/dL (ref 0–99)
Lymphocytes Relative: 23.5 % (ref 12.0–46.0)
Monocytes Relative: 7.1 % (ref 3.0–12.0)
Neutrophils Relative %: 58.6 % (ref 43.0–77.0)
PSA: 1.22 ng/mL (ref 0.10–4.00)
Platelets: 201 10*3/uL (ref 150.0–400.0)
Potassium: 4.6 meq/L (ref 3.5–5.1)
RDW: 22 % — ABNORMAL HIGH (ref 11.5–14.6)
Sodium: 143 meq/L (ref 135–145)
Total CHOL/HDL Ratio: 3
Triglycerides: 73 mg/dL (ref 0.0–149.0)

## 2010-02-07 ENCOUNTER — Encounter (INDEPENDENT_AMBULATORY_CARE_PROVIDER_SITE_OTHER): Payer: Self-pay | Admitting: *Deleted

## 2010-02-07 ENCOUNTER — Ambulatory Visit: Payer: Self-pay | Admitting: Internal Medicine

## 2010-02-07 DIAGNOSIS — Z860100 Personal history of colon polyps, unspecified: Secondary | ICD-10-CM

## 2010-02-07 HISTORY — DX: Personal history of colon polyps, unspecified: Z86.0100

## 2010-02-07 HISTORY — DX: Personal history of colonic polyps: Z86.010

## 2010-02-22 ENCOUNTER — Ambulatory Visit: Payer: Self-pay | Admitting: Internal Medicine

## 2010-03-01 ENCOUNTER — Ambulatory Visit: Payer: Self-pay | Admitting: Internal Medicine

## 2010-03-01 LAB — HM COLONOSCOPY

## 2010-03-03 ENCOUNTER — Ambulatory Visit: Payer: Self-pay | Admitting: Internal Medicine

## 2010-03-08 ENCOUNTER — Encounter: Payer: Self-pay | Admitting: Internal Medicine

## 2010-03-29 ENCOUNTER — Ambulatory Visit: Payer: Self-pay | Admitting: Internal Medicine

## 2010-04-01 ENCOUNTER — Telehealth: Payer: Self-pay | Admitting: Internal Medicine

## 2010-04-20 ENCOUNTER — Ambulatory Visit: Payer: Self-pay | Admitting: Cardiovascular Disease

## 2010-04-20 DIAGNOSIS — F17200 Nicotine dependence, unspecified, uncomplicated: Secondary | ICD-10-CM | POA: Insufficient documentation

## 2010-04-20 HISTORY — DX: Nicotine dependence, unspecified, uncomplicated: F17.200

## 2010-04-27 ENCOUNTER — Ambulatory Visit: Payer: Self-pay | Admitting: Vascular Surgery

## 2010-05-03 ENCOUNTER — Telehealth (INDEPENDENT_AMBULATORY_CARE_PROVIDER_SITE_OTHER): Payer: Self-pay | Admitting: *Deleted

## 2010-05-06 ENCOUNTER — Ambulatory Visit: Payer: Self-pay | Admitting: Internal Medicine

## 2010-06-06 ENCOUNTER — Ambulatory Visit: Payer: Self-pay | Admitting: Internal Medicine

## 2010-06-06 DIAGNOSIS — M25539 Pain in unspecified wrist: Secondary | ICD-10-CM

## 2010-06-06 HISTORY — DX: Pain in unspecified wrist: M25.539

## 2010-06-25 IMAGING — CR DG CHEST 2V
2 series · 2 of 2 positions shown · non-contrast
Comparison: 02/18/2009

CLINICAL DATA: Peripheral vascular disease.  Preop.

CHEST - 2 VIEW

[view not recorded (1 of 2)]
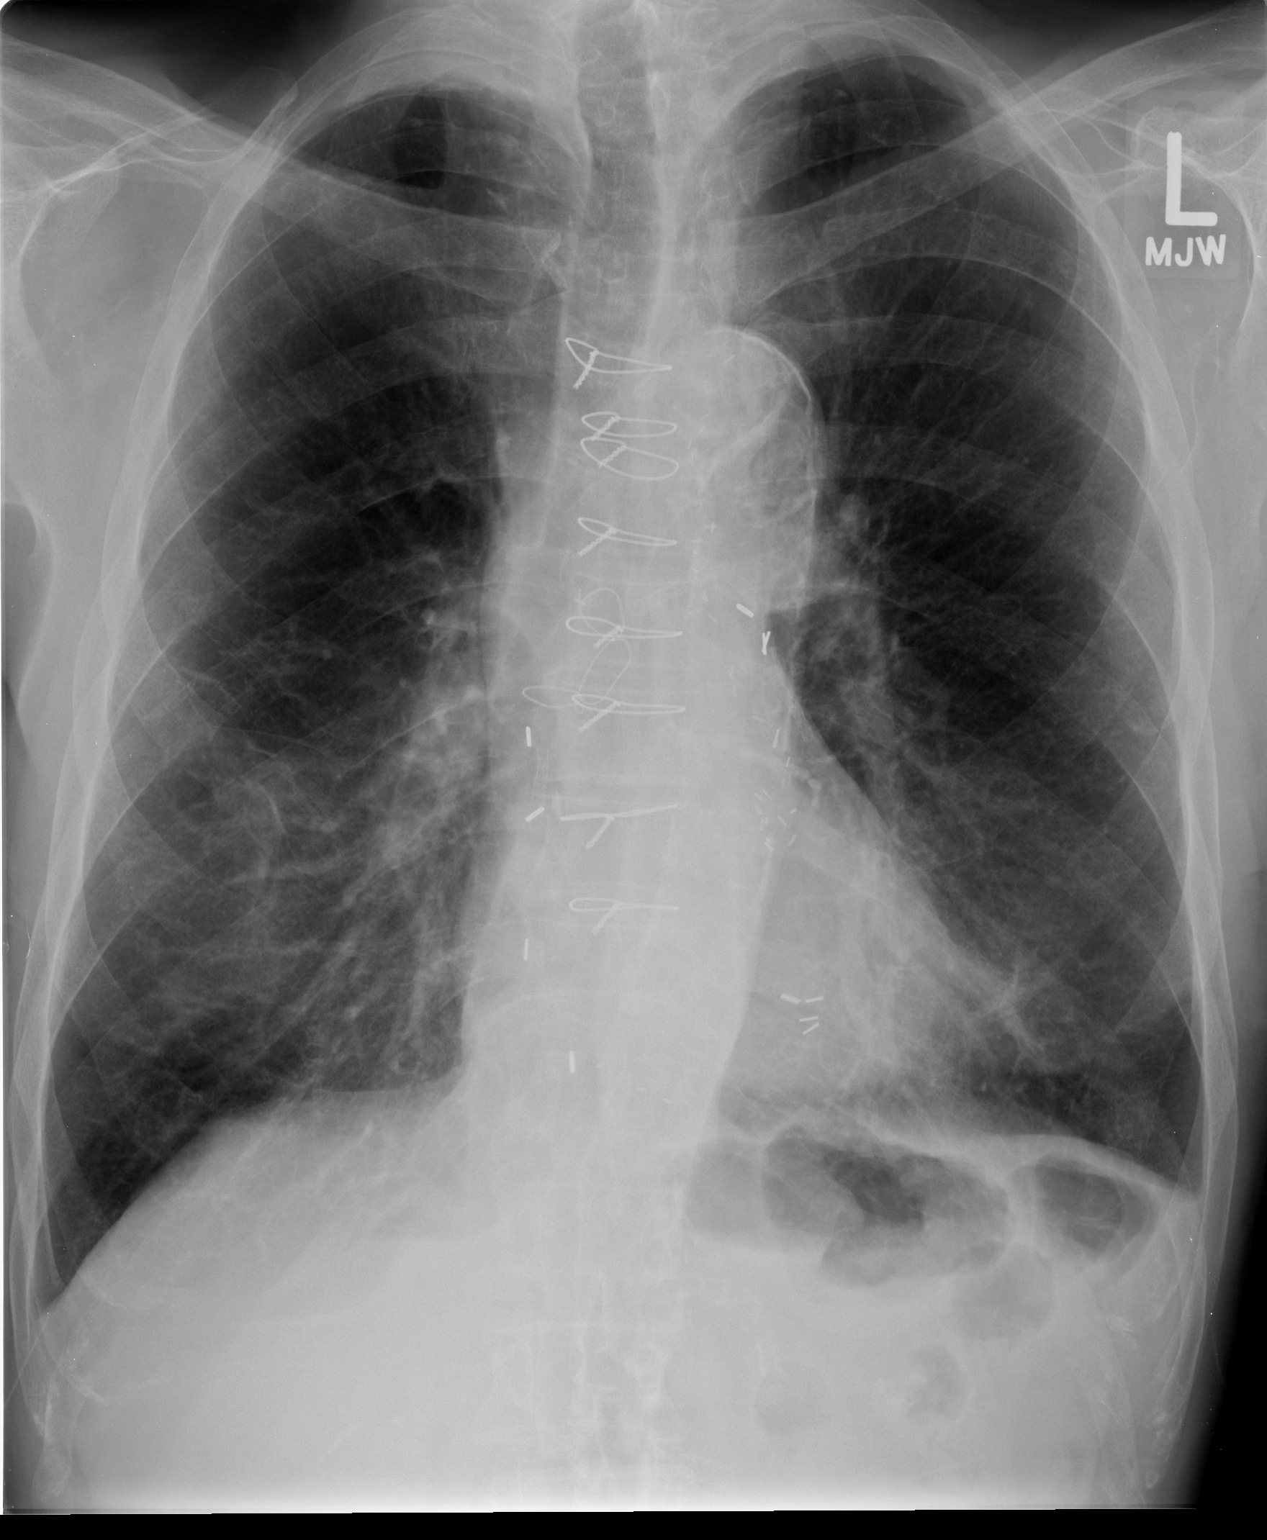

[view not recorded (2 of 2)]
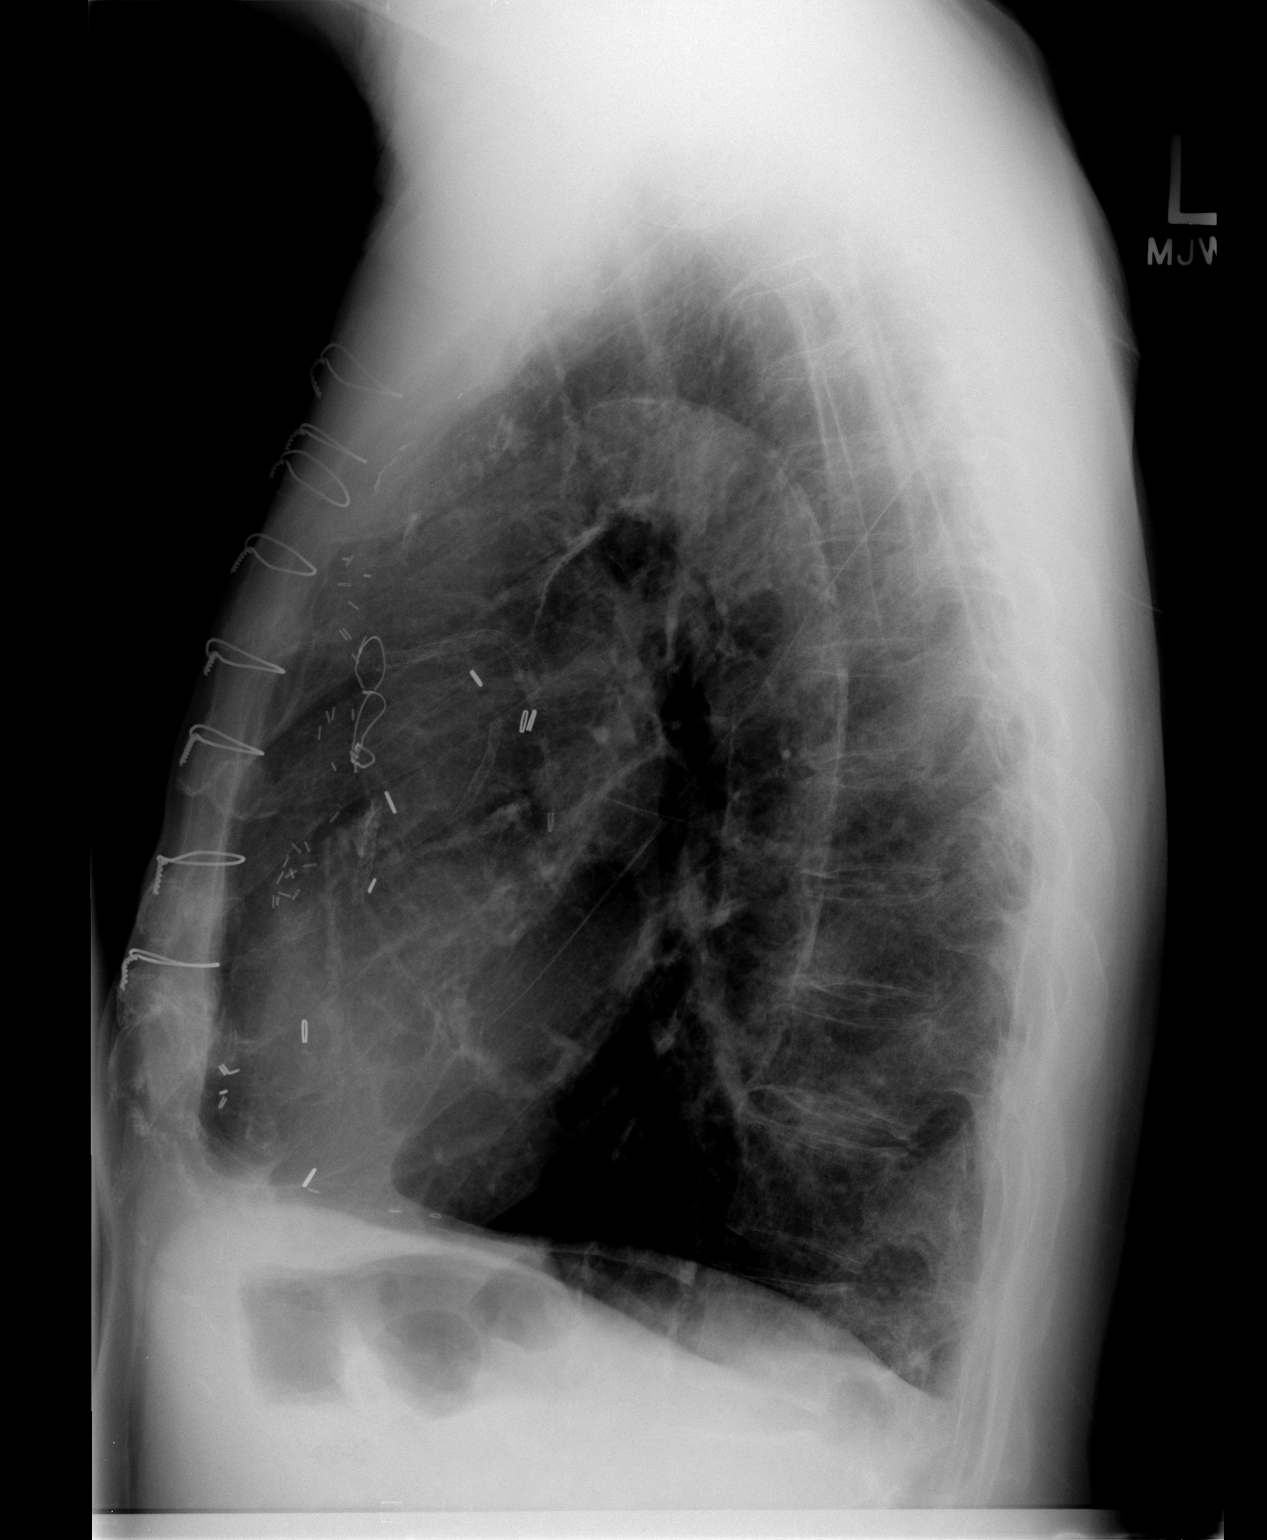

[2 of 2 positions shown; findings below may reference images not displayed]

FINDINGS: Hyperinflation suggests COPD.

Prior median sternotomy. Midline trachea.  Normal heart size and
mediastinal contours for age.  Blunting of the left costophrenic
angle is stable and likely relates to mild pleural thickening.
Right greater left apical pleural thickening.  Diffuse interstitial
thickening.
IMPRESSION: COPD/chronic bronchitis without acute superimposed process.

## 2010-07-13 ENCOUNTER — Ambulatory Visit: Payer: Self-pay | Admitting: Internal Medicine

## 2010-08-03 ENCOUNTER — Ambulatory Visit: Payer: Self-pay | Admitting: Vascular Surgery

## 2010-08-12 ENCOUNTER — Ambulatory Visit: Payer: Self-pay | Admitting: Internal Medicine

## 2010-08-23 ENCOUNTER — Ambulatory Visit
Admission: RE | Admit: 2010-08-23 | Discharge: 2010-08-23 | Payer: Self-pay | Source: Home / Self Care | Attending: Internal Medicine | Admitting: Internal Medicine

## 2010-08-23 DIAGNOSIS — H918X9 Other specified hearing loss, unspecified ear: Secondary | ICD-10-CM | POA: Insufficient documentation

## 2010-09-12 ENCOUNTER — Ambulatory Visit
Admission: RE | Admit: 2010-09-12 | Discharge: 2010-09-12 | Payer: Self-pay | Source: Home / Self Care | Attending: Internal Medicine | Admitting: Internal Medicine

## 2010-09-13 NOTE — Procedures (Signed)
Summary: Colonoscopy  Patient: [name] Note: All result statuses are Final unless otherwise noted.  Tests: (1) Colonoscopy (COL)   COL Colonoscopy           DONE     [facility]     [address]     [place], [place]  [address]           COLONOSCOPY PROCEDURE REPORT           PATIENT:  [name], [name]  MR#:  [id number]     BIRTHDATE:  [birth year], 75 yrs. old  GENDER:  male     ENDOSCOPIST:  Iva Boop, MD, [facility]           PROCEDURE DATE:  03/01/2010     PROCEDURE:  Colonoscopy with snare polypectomy     ASA CLASS:  Class III     INDICATIONS:  Iron deficiency anemia     MEDICATIONS:   Fentanyl 25 mcg IV, Versed 2 mg IV, There was     residual sedation effect present from prior procedure.           DESCRIPTION OF PROCEDURE:   After the risks benefits and     alternatives of the procedure were thoroughly explained, informed     consent was obtained.  Digital rectal exam was performed and     revealed no abnormalities and normal prostate.   The LB [id number]     [id number] endoscope was introduced through the anus and advanced to     the cecum, which was identified by both the appendix and ileocecal     valve, without limitations.  The quality of the prep was good,     using MoviPrep.  The instrument was then slowly withdrawn as the     colon was fully examined.     Insertion: 4:05 minutes Withdrawal: 10:22 minutes     <<PROCEDUREIMAGES>>           FINDINGS:  Two polyps were found. 4mm transverse polyp and 5 mm     rectal polyp. Polyps were snared without cautery. Retrieval was     successful. snare polyp  Moderate diverticulosis was found in the     sigmoid colon.  This was otherwise a normal examination of the     colon.   Retroflexed views in the rectum revealed internal     hemorrhoids.    The scope was then withdrawn from the patient and     the procedure completed.           COMPLICATIONS:  None     ENDOSCOPIC IMPRESSION:     1) Two small polyps removed     2)  Moderate diverticulosis in the sigmoid colon     3) Internal hemorrhoids     4) Otherwise normal examination, good prep     5) Personal history of 4 adenomas (2 tubulovillous and 1 large     with high-grade dysplasia) removal in 2008     RECOMMENDATIONS:     resume Plavix and continue other medications     Keep follow-up with Dr. Jonny Ruiz re: anemia           REPEAT EXAM:  In for Colonoscopy, pending biopsy results. With his     comorbidities will consider as needed follow-up.           Iva Boop, MD, [facility]           CC:  The Patient  Corwin Levins, MD           n.     Rosalie Doctor:   Iva Boop at 03/01/2010 03:05 PM           Andreas Blower, [id number]  Note: An exclamation mark (!) indicates a result that was not dispersed into the flowsheet. Document Creation Date: 03/01/2010 3:05 PM _______________________________________________________________________  (1) Order result status: Final Collection or observation date-time: 03/01/2010 14:53 Requested date-time:  Receipt date-time:  Reported date-time:  Referring Physician:   Ordering Physician: Stan Head [id number]) Specimen Source:  Source: Launa Grill Order Number: [id number] Lab site:   Appended Document: Colonoscopy     Procedures Next Due Date:    Colonoscopy: 03/2015

## 2010-09-13 NOTE — Assessment & Plan Note (Signed)
Summary: PER DAHLIA SCHED  STC   Vital Signs:  Patient profile:   76 year old male Height:      72 inches Weight:      170.25 pounds BMI:     23.17 O2 Sat:      96 % on Room air Temp:     97.3 degrees F oral Pulse rate:   73 / minute BP sitting:   142 / 60  (left arm) Cuff size:   regular  Vitals Entered By: Margaret Pyle, CMA (June 06, 2010 3:27 PM)  O2 Flow:  Room air CC: RT hand swollen x 3 days, no pain fever. Warm to touch   Primary Care Provider:  [name], MD  CC:  RT hand swollen x 3 days and no pain fever. Warm to touch.  History of Present Illness: here with acute ; c/o 2-3 days onset right wrist mod to severe pain, swelling without fever or hx of gout.  Did have a fall about 10 days ago , falling back and to the right to the outstretched hand and wrist, but seemed a mild fall at the time and did not have signficant pain and swelling immed after adn did not seek medical attention;  Pt denies CP, worsening sob, doe, wheezing, orthopnea, pnd, worsening LE edema, palps, dizziness or syncope  Pt denies new neuro symptoms such as headache, facial or extremity weakness Pt denies polydipsia, polyuria.  BP at home often < 140/90 per pt  Problems Prior to Update: 1)  Wrist Pain, Right  (ICD-719.43) 2)  Tobacco Abuse  (ICD-305.1) 3)  Chest Pain  (ICD-786.50) 4)  Hepatotoxicity, Drug-induced, Risk of  (ICD-V58.69) 5)  Vitamin B12 Deficiency  (ICD-266.2) 6)  Anemia, Iron Deficiency  (ICD-280.9) 7)  Chronic Obstructive Pulmonary Disease, Acute Exacerbation  (ICD-491.21) 8)  Urinary Retention  (ICD-788.20) 9)  Fatigue  (ICD-780.79) 10)  Hypokalemia  (ICD-276.8) 11)  Mitral Regurgitation  (ICD-396.3) 12)  Disease, Peripheral Vascular Nec  (ICD-443.89) 13)  Peripheral Vascular Disease  (ICD-443.9) 14)  Mi  (ICD-410.90) 15)  Coronary Artery Disease  (ICD-414.00) 16)  COPD  (ICD-496) 17)  Hypercholesterolemia  (ICD-272.0) 18)  Hypertension  (ICD-401.9) 19)   Mesenteric Vascular Insufficiency  (ICD-557.1) 20)  Cerebrovascular Accident, Hx of  (ICD-V12.50) 21)  Renal Artery Stenosis  (ICD-440.1) 22)  Hiatal Hernia  (ICD-553.3) 23)  Pud  (ICD-533.90) 24)  Hypokalemia  (ICD-276.8) 25)  Tubulovillous Adenoma, Colon, Hx of  (ICD-V12.72) 26)  Diverticulosis, Colon  (ICD-562.10) 27)  Anemia-iron Deficiency  (ICD-280.9) 28)  Glucose Intolerance  (ICD-271.3) 29)  Depression  (ICD-311) 30)  Anxiety  (ICD-300.00) 31)  Gerd  (ICD-530.81) 32)  Hemorrhoids  (ICD-455.6)  Medications Prior to Update: 1)  Cilostazol 100 Mg Tabs (Cilostazol) .Marland Kitchen.. 1po Bid 2)  Lipitor 20 Mg Tabs (Atorvastatin Calcium) .Marland Kitchen.. 1po Once Daily 3)  Isosorbide Mononitrate Cr 60 Mg Tb24 (Isosorbide Mononitrate) .Marland Kitchen.. 1po Qd 4)  Lisinopril 20 Mg Tabs (Lisinopril) .Marland Kitchen.. 1po Qd 5)  Metoprolol Succinate 100 Mg Tb24 (Metoprolol Succinate) .Marland Kitchen.. 1 By Mouth Qd 6)  Plavix 75 Mg Tabs (Clopidogrel Bisulfate) .Marland Kitchen.. 1 Tab Once Daily 7)  Nitroglycerin 0.4 Mg Subl (Nitroglycerin) .Marland Kitchen.. 1 Tab Sl As Needed 8)  Proair Hfa 108 (90 Base) Mcg/act Aers (Albuterol Sulfate) .... 2 Puffs Four Times Per Day As Needed 9)  Aspir-Low 81 Mg Tbec (Aspirin) .Marland Kitchen.. 1 By Mouth Once Daily 10)  Omeprazole 20 Mg Cpdr (Omeprazole) .Marland Kitchen.. 1 By Mouth Once Daily 11)  Cyanocobalamin  1000 Mcg/ml Soln (Cyanocobalamin) .Marland Kitchen.. 1 Cc Im 1 Month 12)  Ferrous Sulfate 325 (65 Fe) Mg Tabs (Ferrous Sulfate) .Marland Kitchen.. 1po Once Daily  Current Medications (verified): 1)  Cilostazol 100 Mg Tabs (Cilostazol) .Marland Kitchen.. 1po Bid 2)  Lipitor 20 Mg Tabs (Atorvastatin Calcium) .Marland Kitchen.. 1po Once Daily 3)  Isosorbide Mononitrate Cr 60 Mg Tb24 (Isosorbide Mononitrate) .Marland Kitchen.. 1po Qd 4)  Lisinopril 20 Mg Tabs (Lisinopril) .Marland Kitchen.. 1po Qd 5)  Metoprolol Succinate 100 Mg Tb24 (Metoprolol Succinate) .Marland Kitchen.. 1 By Mouth Qd 6)  Plavix 75 Mg Tabs (Clopidogrel Bisulfate) .Marland Kitchen.. 1 Tab Once Daily 7)  Nitroglycerin 0.4 Mg Subl (Nitroglycerin) .Marland Kitchen.. 1 Tab Sl As Needed 8)  Proair Hfa 108 (90  Base) Mcg/act Aers (Albuterol Sulfate) .... 2 Puffs Four Times Per Day As Needed 9)  Aspir-Low 81 Mg Tbec (Aspirin) .Marland Kitchen.. 1 By Mouth Once Daily 10)  Omeprazole 20 Mg Cpdr (Omeprazole) .Marland Kitchen.. 1 By Mouth Once Daily 11)  Cyanocobalamin 1000 Mcg/ml Soln (Cyanocobalamin) .Marland Kitchen.. 1 Cc Im 1 Month 12)  Ferrous Sulfate 325 (65 Fe) Mg Tabs (Ferrous Sulfate) .Marland Kitchen.. 1po Once Daily 13)  Tramadol Hcl 50 Mg Tabs (Tramadol Hcl) .Marland Kitchen.. 1 By Mouth Q 6 Hrs As Needed 14)  Prednisone 10 Mg Tabs (Prednisone) .... 3po Qd For 3days, Then 2po Qd For 3days, Then 1po Qd For 3days, Then Stop  Allergies (verified): No Known Drug Allergies  Past History:  Past Medical History: Last updated: 04/20/2010 COPD Coronary artery disease s/p 4V CABG 1995, multiple stenting procedures post CABG Hyperlipidemia Renal Artery Stenosis-bilateral stents GERD Cerebrovascular accident, hx of hx of bilat vertebral artery stenosis Peripheral vascular disease-severe lower ext disease s/p 2 failed left fem-pop bypasses, mesenteric bypass, iliac artery stenting, bilateral carotid endarterectomy, right common femoral artery endarterectomy with right fem pop bypass February 2011 per Dr. Edilia Bo Hypertension Moderate mitral regurgitation EGD 1997 and 2004 - erosive esophagitis, antral ulcer Anxiety Depression glucose intolerance hx of contrast induced nephropathy Anemia-iron deficiency hx of coumadin tx - stopped due to GI blood loss Diverticulosis, colon Colonic polyps, hx of - Dr Leone Payor intestinal angina s/p mesenteric bypass 2010 Anemia-chronic and iron deficient legal blindness  - retinal disease - does not drive  Past Surgical History: Last updated: 11/10/2009 Coronary artery bypass graft- 1995 4V Renal Artery Stenting- 2004 - bilat s/p multiple LE vascular bypass (includine fem-pop x 2 LLE) s/p bilateral CEA s/p PTCA to the SVG to DIAG, SVG to PDA and SVG to OM Appendectomy s/p lumbar disc surgury x 2 s/p RLE fem bypass feb  2011   Social History: Last updated: 02/07/2010 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   Head:  normocephalic and atraumatic.   Eyes:  vision grossly intact, pupils equal, and pupils round.   Ears:  R ear normal and L ear normal.   Nose:  no external deformity and no nasal discharge.   Mouth:  no gingival abnormalities and pharynx pink and moist.   Neck:  supple and no masses.   Lungs:  normal respiratory effort, R decreased breath sounds, and L decreased breath sounds.   Heart:  normal rate and regular rhythm.  Msk:  right wrist 2+ pain, tender, swelling and decreased ROM without pinpoint tender or erythema Extremities:  no edema, no erythema    Impression & Recommendations:  Problem # 1:  WRIST PAIN, RIGHT (ICD-719.43)  with marked swelling; though fell approx 10 days ago at church and needs film to r/o fx, seems more likely due to gout - for depomedrol IM today, pred pack, pain med, f/u next visit with uric acid  Orders: Depo- Medrol 40mg  (J1030) Depo- Medrol 80mg  (J1040) Admin of Therapeutic Inj  intramuscular or subcutaneous (96295) T-Wrist Comp Right (73110TC)  Problem # 2:  VITAMIN B12 DEFICIENCY (ICD-266.2) for b12 IM today as he is due Orders: Vit B12 1000 mcg (J3420) Admin of Therapeutic Inj  intramuscular or subcutaneous (28413)  Problem # 3:  HYPERTENSION (ICD-401.9)  His updated medication list for this problem includes:    Lisinopril 20 Mg Tabs (Lisinopril) .Marland Kitchen... 1po qd    Metoprolol Succinate 100 Mg Tb24 (Metoprolol succinate) .Marland Kitchen... 1 by mouth qd  BP today: 142/60 Prior BP: 130/54  (04/20/2010)  Labs Reviewed: K+: 4.6 (01/31/2010) Creat: : 1.3 (01/31/2010)   Chol: 135 (01/31/2010)   HDL: 47.60 (01/31/2010)   LDL: 73 (01/31/2010)   TG: 73.0 (01/31/2010) stable overall by hx and exam, ok to continue meds/tx as is  - mild elev today, likely situational, ok to follow, continue same treatment   Problem # 4:  GLUCOSE INTOLERANCE (ICD-271.3) asympt;  pt to call for onset polys or cbg > 200  Complete Medication List: 1)  Cilostazol 100 Mg Tabs (Cilostazol) .Marland Kitchen.. 1po bid 2)  Lipitor 20 Mg Tabs (Atorvastatin calcium) .Marland Kitchen.. 1po once daily 3)  Isosorbide Mononitrate Cr 60 Mg Tb24 (Isosorbide mononitrate) .Marland Kitchen.. 1po qd 4)  Lisinopril 20 Mg Tabs (Lisinopril) .Marland Kitchen.. 1po qd 5)  Metoprolol Succinate 100 Mg Tb24 (Metoprolol succinate) .Marland Kitchen.. 1 by mouth qd 6)  Plavix 75 Mg Tabs (Clopidogrel bisulfate) .Marland Kitchen.. 1 tab once daily 7)  Nitroglycerin 0.4 Mg Subl (Nitroglycerin) .Marland Kitchen.. 1 tab sl as needed 8)  Proair Hfa 108 (90 Base) Mcg/act Aers (Albuterol sulfate) .... 2 puffs four times per day as needed 9)  Aspir-low 81 Mg Tbec (Aspirin) .Marland Kitchen.. 1 by mouth once daily 10)  Omeprazole 20 Mg Cpdr (Omeprazole) .Marland Kitchen.. 1 by mouth once daily 11)  Cyanocobalamin 1000 Mcg/ml Soln (Cyanocobalamin) .Marland Kitchen.. 1 cc im 1 month 12)  Ferrous Sulfate 325 (65 Fe) Mg Tabs (Ferrous sulfate) .Marland Kitchen.. 1po once daily 13)  Tramadol Hcl 50 Mg Tabs (Tramadol hcl) .Marland Kitchen.. 1 by mouth q 6 hrs as needed 14)  Prednisone 10 Mg Tabs (Prednisone) .... 3po qd for 3days, then 2po qd for 3days, then 1po qd for 3days, then stop  Patient Instructions: 1)  you had the steroid shot and b12 shots today 2)  Please take all new medications as prescribed - the prednisone 3)  Please go to Radiology in the basement level for your X-Ray today  4)  Please call the number on the [facility] Card for results of your testing  5)  Continue all previous medications as before this visit  6)  Please schedule a follow-up appointment in 2 months, or sooner if  needed Prescriptions: PREDNISONE 10 MG TABS (PREDNISONE) 3po qd for 3days, then 2po qd for 3days, then 1po qd for 3days, then stop  #18 x 0   Entered and Authorized by:   Corwin Levins MD   Signed by:   Corwin Levins MD on 06/06/2010   Method used:  Print then Give to Patient   RxID:   [id number] TRAMADOL HCL 50 MG TABS (TRAMADOL HCL) 1 by mouth q 6 hrs as needed  #60 x 1   Entered and Authorized by:   Corwin Levins MD   Signed by:   Corwin Levins MD on 06/06/2010   Method used:   Print then Give to Patient   RxID:   [id number]    Medication Administration  Injection # 1:    Medication: Depo- Medrol 40mg     Diagnosis: WRIST PAIN, RIGHT (ICD-719.43)    Route: IM    Site: LUOQ gluteus    Exp Date: 11/2012    Lot #: [id number]    Mfr: [employer]    Comments: Patient received 120mg  Depo-Medrol    Patient tolerated injection without complications    Given by: Zella Ball Ewing CMA Duncan Dull) (June 06, 2010 4:21 PM)  Injection # 2:    Medication: Depo- Medrol 80mg     Diagnosis: WRIST PAIN, RIGHT (ICD-719.43)    Route: IM    Site: LUOQ gluteus    Exp Date: 11/2012    Lot #: [id number]    Mfr: [employer]    Given by: Zella Ball Ewing CMA Duncan Dull) (June 06, 2010 4:21 PM)  Injection # 3:    Medication: Vit B12 1000 mcg    Diagnosis: VITAMIN B12 DEFICIENCY (ICD-266.2)    Route: IM    Site: R deltoid    Exp Date: 02/2012    Lot #: [id number]    Mfr: [employer]    Patient tolerated injection without complications    Given by: Zella Ball Ewing CMA Duncan Dull) (June 06, 2010 4:22 PM)  Orders Added: 1)  Depo- Medrol 40mg  [J1030] 2)  Depo- Medrol 80mg  [J1040] 3)  Admin of Therapeutic Inj  intramuscular or subcutaneous [96372] 4)  Vit B12 1000 mcg [J3420] 5)  Admin of Therapeutic Inj  intramuscular or subcutaneous [96372] 6)  T-Wrist Comp Right [73110TC] 7)  Est. Patient Level IV [16606]

## 2010-09-13 NOTE — Miscellaneous (Signed)
Summary: Office Visit - Infectious Disease  Clinical Lists Changes  Problems: Added new problem of VITAMIN B12 DEFICIENCY (ICD-266.2) Medications: Added new medication of CYANOCOBALAMIN 1000 MCG/ML SOLN (CYANOCOBALAMIN) 1 cc IM 1 month

## 2010-09-13 NOTE — Assessment & Plan Note (Signed)
Summary: eph/rsc from NOS on 6/13/lg   Visit Type:  Post-hospital Referring Provider:  [name], MD Primary Provider:  [name], MD  CC:  no cardiac complains.  History of Present Illness: 76 yo WM with complex past medical history including CAD s/p 4vCABG 1995 and recent stenting of 2 saphenous vein grafts, bilateral CEA, mesenteric artery bypass, 2 failed left fem-pop bypasses, renal artery stenting, iliac artery stenting, recent right common femoral artery endarterctomy February 2011, HTN, hyperlipidemia and ongoing tobacco abuse here today for follow up. Mr. Tibbs was admitted to [facility] 4/410 with complaints of chest pain. He ruled out for an MI with serial cardiac enzymes. Cardiac cath on November 16, 2008 showed severe triple vessel disease, patent LIIMA to LAD, patent SVG to Diagonal, patent SVG to OM with serial high grade lesions and SVG to PDA with severe in stent restenosis of the body of the vein graft. PCI was performed on 11/20/08 with placement of 2 drug eluting stents in the body of SVG to OM, cutting balloon angioplasty in the segment of in-stent restenosis in the SVG to PDA. I have not seen him in our office since April of 2010.   He was admittted to [facility] in February for right femoral artery endarterectomy and right femoral to below the knee popliteal bypass because of a non-healing wound of his right great toe. Dr. Edilia Bo follows his vascular disease. He was readmitted  in May 2011 to [facility] with chest pain. He ruled out for an MI with serial cardiac enzymes. I discussed a cath at that time but his Hgb was low at 7. He also wished to go home and did not want an inpatient workup for his anemia. Since discharge, he has seen GI and had an upper and lower endoscopy. Nothing was found on either endoscopy. Etiology of anemia presumed to be iron deficiency. Most recent Hbg in June was 11.   He tells me that since discharge,  he has had rare chest pain. He has  been mildly active. He has only used his NTG SL tablets 1 time every 2 months. No SOB over his baseline. No near syncope or syncope.He has plans to see Dr. Edilia Bo next week.    He continues to smoke daily and has no desire to stop.   Current Medications (verified): 1)  Cilostazol 100 Mg Tabs (Cilostazol) .Marland Kitchen.. 1po Bid 2)  Lipitor 20 Mg Tabs (Atorvastatin Calcium) .Marland Kitchen.. 1po Once Daily 3)  Isosorbide Mononitrate Cr 60 Mg Tb24 (Isosorbide Mononitrate) .Marland Kitchen.. 1po Qd 4)  Lisinopril 20 Mg Tabs (Lisinopril) .Marland Kitchen.. 1po Qd 5)  Metoprolol Succinate 100 Mg Tb24 (Metoprolol Succinate) .Marland Kitchen.. 1 By Mouth Qd 6)  Plavix 75 Mg Tabs (Clopidogrel Bisulfate) .Marland Kitchen.. 1 Tab Once Daily 7)  Nitroglycerin 0.4 Mg Subl (Nitroglycerin) .Marland Kitchen.. 1 Tab Sl As Needed 8)  Proair Hfa 108 (90 Base) Mcg/act Aers (Albuterol Sulfate) .... 2 Puffs Four Times Per Day As Needed 9)  Aspir-Low 81 Mg Tbec (Aspirin) .Marland Kitchen.. 1 By Mouth Once Daily 10)  Omeprazole 20 Mg Cpdr (Omeprazole) .Marland Kitchen.. 1 By Mouth Once Daily 11)  Cyanocobalamin 1000 Mcg/ml Soln (Cyanocobalamin) .Marland Kitchen.. 1 Cc Im 1 Month 12)  Ferrous Sulfate 325 (65 Fe) Mg Tabs (Ferrous Sulfate) .Marland Kitchen.. 1po Once Daily  Allergies (verified): No Known Drug Allergies  Past History:  Past Medical History: COPD Coronary artery disease s/p 4V CABG 1995, multiple stenting procedures post CABG Hyperlipidemia Renal Artery Stenosis-bilateral stents GERD Cerebrovascular accident, hx of hx  of bilat vertebral artery stenosis Peripheral vascular disease-severe lower ext disease s/p 2 failed left fem-pop bypasses, mesenteric bypass, iliac artery stenting, bilateral carotid endarterectomy, right common femoral artery endarterectomy with right fem pop bypass February 2011 per Dr. Edilia Bo Hypertension Moderate mitral regurgitation EGD 1997 and 2004 - erosive esophagitis, antral ulcer Anxiety Depression glucose intolerance hx of contrast induced nephropathy Anemia-iron deficiency hx of coumadin tx - stopped due  to GI blood loss Diverticulosis, colon Colonic polyps, hx of - Dr Leone Payor intestinal angina s/p mesenteric bypass 2010 Anemia-chronic and iron deficient legal blindness  - retinal disease - does not drive  Social History: Reviewed history from 02/07/2010 and no changes required. 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 Current Smoker-1 ppd Alcohol use-no No illicit drug use 2 children Daily Caffeine Use 1/2 decaf 1/2 caffine coffee a day  Review of Systems  The patient denies fatigue, malaise, fever, weight gain/loss, vision loss, decreased hearing, hoarseness, chest pain, palpitations, shortness of breath, prolonged cough, wheezing, sleep apnea, coughing up blood, abdominal pain, blood in stool, nausea, vomiting, diarrhea, heartburn, incontinence, blood in urine, muscle weakness, joint pain, leg swelling, rash, skin lesions, headache, fainting, dizziness, depression, anxiety, enlarged lymph nodes, easy bruising or bleeding, and environmental allergies.    Vital Signs:  Patient profile:   76 year old male Height:      72 inches Weight:      169.25 pounds BMI:     23.04 Pulse rate:   60 / minute Pulse rhythm:   regular Resp:     18 per minute BP sitting:   130 / 54  (right arm) Cuff size:   large  Vitals Entered By: Vikki Ports (April 20, 2010 1:46 PM)  Physical Exam  General:  General: Well developed, well nourished, NAD Musculoskeletal: Muscle strength 5/5 all ext Psychiatric: Mood and affect normal Neck: No JVD, bilateral  carotid bruits, no thyromegaly, no lymphadenopathy. Lungs:Clear bilaterally, no wheezes, rhonci, crackles CV: RRR with mild systolic  murmur.  No gallops rubs Abdomen: soft, NT, ND, BS present Extremities: No edema. Non-palpable pulses bilateral DP/PT. No ulcerations.     EKG  Procedure date:  04/20/2010  Findings:      NSR, rate 61 bpm. LAD. Possible prior septal infarct.   Impression &  Recommendations:  Problem # 1:  CORONARY ARTERY DISEASE (ICD-414.00) Mr. Merriott has known severe CAD with prior bypass. I performed PCI of two of his vein grafts in April 2010. He has missed multiple appointments since then. I saw him in the hospital in May at time of admission for chest pain. He ruled out for an MI with serial enzymes and did not wish to stay for inpatient workup of anemia or for cardiac cath. He really has not had much chest pain over the last 4 months. I do not think a stress test would be helpful since his disease is diffuse and there are multiple grafts with moderate lesions. I have discussed repeat cath if his chest pain increases in frequency or changes in character. He promises me that he will call if his symptoms change. We would arrange a cardiac cath at that time. Will continue current meds.   His updated medication list for this problem includes:    Cilostazol 100 Mg Tabs (Cilostazol) .Marland Kitchen... 1po bid    Isosorbide Mononitrate Cr 60 Mg Tb24 (Isosorbide mononitrate) .Marland Kitchen... 1po qd    Lisinopril 20 Mg Tabs (Lisinopril) .Marland Kitchen... 1po qd    Metoprolol Succinate  100 Mg Tb24 (Metoprolol succinate) .Marland Kitchen... 1 by mouth qd    Plavix 75 Mg Tabs (Clopidogrel bisulfate) .Marland Kitchen... 1 tab once daily    Nitroglycerin 0.4 Mg Subl (Nitroglycerin) .Marland Kitchen... 1 tab sl as needed    Aspir-low 81 Mg Tbec (Aspirin) .Marland Kitchen... 1 by mouth once daily  Problem # 2:  HYPERTENSION (ICD-401.9) BP well controlled.   His updated medication list for this problem includes:    Lisinopril 20 Mg Tabs (Lisinopril) .Marland Kitchen... 1po qd    Metoprolol Succinate 100 Mg Tb24 (Metoprolol succinate) .Marland Kitchen... 1 by mouth qd    Aspir-low 81 Mg Tbec (Aspirin) .Marland Kitchen... 1 by mouth once daily  Problem # 3:  TOBACCO ABUSE (ICD-305.1) Smoking cessation once again encouraged. Pt refuses to stop smoking despite multiple interventions on his carotids, renals, mesenteric arteries, lower ext vessels and coronary arteries.   Other Orders: EKG w/ Interpretation  (93000)  Patient Instructions: 1)  Your physician recommends that you schedule a follow-up appointment in: 6 months 2)  Your physician recommends that you continue on your current medications as directed. Please refer to the Current Medication list given to you today. Prescriptions: NITROGLYCERIN 0.4 MG SUBL (NITROGLYCERIN) 1 tab SL as needed  #25 x 2   Entered by:   Whitney Maeola Sarah RN   Authorized by:   Verne Carrow, MD   Signed by:   Ellender Hose RN on 04/20/2010   Method used:   [facility]        [facility] [address] [phone]* (retail)       [address]       [place], [place]  [address]       Ph: [phone] or [phone]       Fax: [phone]   RxID:   [id number]

## 2010-09-13 NOTE — Letter (Signed)
Summary: Anticoagulation Modification Letter   Gastroenterology  [address] [place], [place] [address]   Phone: [phone]  Fax: [phone]    February 07, 2010  Re:    DEONTRAE DRINKARD DOB:    1935/04/04 MRN:  [id number]   Dear Dr. Clifton James:   We have scheduled the above patient for an endoscopic procedure with Dr. Leone Payor. Our records show that he is on anticoagulation therapy. Please advise as to how long he may come off his therapy of Plavix prior to the scheduled procedure on March 01, 2010.   Please append this letter and route the completed form to Francee Piccolo, CMA.   Thank you for your help with this matter.   Sincerely,  Francee Piccolo CMA Duncan Dull)   Physician Recommendation:  Hold Plavix 7 days prior ________________   Appended Document: Anticoagulation Modification Letter Mr. Rutigliano had 2 drug eluting stents placed in April 2010. He can hold Plavix prior to his planned colonoscopy and restart when it is felt to be safe from a GI standpoint.   Earney Hamburg  Appended Document: Anticoagulation Modification Letter Pt's wife notified OK per Dr. Clifton James to hold Plavix 7 days prior to exam.  Pt will stop on 02/22/10.  Pt wife is able to repeat instruction.  Verified appt is on Tuesday, 7/19.  Copy printed and filed in [facility] chart.

## 2010-09-13 NOTE — Assessment & Plan Note (Signed)
Summary: ANEMIA- IRON DEF/YF   History of Present Illness Visit Type: Follow-up Consult Primary GI MD: Stan Head MD [facility] Primary Provider: [name], MD Requesting Provider: [name], MD Chief Complaint: Patient referred for iron def anemia. He denies any GI complaints.  History of Present Illness:   76 yo wm with hx of mesenteric ischmia, s/p bypass with resolution. admitted 5/18-5/20 with chest pain and found to have iron-deficiency anemia. He ruled out for MI and had negative echo. He has seen Dr. Jonny Ruiz since then. He has taken iron supplements with improved Hgb and is asymptomatic, denying chest pain or dyspnea. No signs of GI bleeding reported. Omeprazole is controlling GERD His wife provides some hx.   GI Review of Systems      Denies abdominal pain, acid reflux, belching, bloating, chest pain, dysphagia with liquids, dysphagia with solids, heartburn, loss of appetite, nausea, vomiting, vomiting blood, weight loss, and  weight gain.        Denies anal fissure, black tarry stools, change in bowel habit, constipation, diarrhea, diverticulosis, fecal incontinence, heme positive stool, hemorrhoids, irritable bowel syndrome, jaundice, light color stool, liver problems, rectal bleeding, and  rectal pain. Clinical Reports Reviewed:  EGD:  06/18/2007:  Findings: Gastritis  Findings: Ulcer   Comments: 1) ABNORMAL DUODENAL MUCOSA, UNCLEAR SIGNIFICANCE. MALIGNANCY MUST BE EXCLUDED, MAY BE SAM PROBLEM SEEN IN YRS PAST (HX OF EROSIVE DUODENITIS) 2) ABNORMAL ANTRAL MUCOSA 3) HIATAL HERNIA 4) FRIABLE MUCOSA WITH 2 SMALL AREAS OF BLEEDING FROM SCOPE CONTACT, SPONTANEOUSLY STOPPED BUT TOOK A FEW MINS ON PLAVIX, ASA, CILOSTAZL. 5) MILD GASTRIC RETENTION  ***MICROSCOPIC EXAMINATION AND DIAGNOSIS***    1. DUODENAL BIOPSIES: ULCERATED DUODENAL MUCOSA. NO FEATURES OF  SPRUE OR MALIGNANCY IDENTIFIED.    2. ANTRAL MUCOSA, BIOPSIES: MILD CHRONIC GASTRITIS. NO   HELICOBACTER PYLORI,  DYSPLASIA, OR MALIGNANCY IDENTIFIED.  Colonoscopy:  06/21/2007:  Results: Diverticulosis.   Pathology:  Adenomatous polyp.         Diagnoses: 211.3: Colon Polyps. X 4 INCLUDING LARGE RECTAL LESION.  562.10: Diverticulosis, Colon. MOD.   Comments: STILL CONCERNED THAT PATIENT'S POST PRANDIAL PAIN AND WT LOSS DUE TO INTESTINAL ANGINA  ***MICROSCOPIC EXAMINATION AND DIAGNOSIS***    1. COLON, DESCENDING POLYP: TUBULAR ADENOMA. NO HIGH GRADE   DYSPLASIA OR MALIGNANCY IDENTIFIED.    2. COLON, SMALL RECTAL POLYP: TUBULOVILLOUS ADENOMA. NO HIGH   GRADE DYSPLASIA OR MALIGNANCY IDENTIFIED.    3. COLON, LARGE RECTAL POLYP, BIOPSIES: TUBULOVILLOUS ADENOMA   WITH HIGH GRADE GLANDULAR DYSPLASIA.  06/21/2007:  abnormal     Current Medications (verified): 1)  Cilostazol 100 Mg Tabs (Cilostazol) .Marland Kitchen.. 1po Bid 2)  Lipitor 20 Mg Tabs (Atorvastatin Calcium) .Marland Kitchen.. 1po Once Daily 3)  Isosorbide Mononitrate Cr 60 Mg Tb24 (Isosorbide Mononitrate) .Marland Kitchen.. 1po Qd 4)  Lisinopril 20 Mg Tabs (Lisinopril) .Marland Kitchen.. 1po Qd 5)  Metoprolol Succinate 100 Mg Tb24 (Metoprolol Succinate) .Marland Kitchen.. 1 By Mouth Qd 6)  Plavix 75 Mg Tabs (Clopidogrel Bisulfate) .Marland Kitchen.. 1 Tab Once Daily 7)  Nitroglycerin 0.4 Mg Subl (Nitroglycerin) .Marland Kitchen.. 1 Tab Sl As Needed 8)  Proair Hfa 108 (90 Base) Mcg/act Aers (Albuterol Sulfate) .... 2 Puffs Four Times Per Day As Needed 9)  Aspir-Low 81 Mg Tbec (Aspirin) .Marland Kitchen.. 1 By Mouth Once Daily 10)  Omeprazole 20 Mg Cpdr (Omeprazole) .Marland Kitchen.. 1 By Mouth Once Daily 11)  Cyanocobalamin 1000 Mcg/ml Soln (Cyanocobalamin) .Marland Kitchen.. 1 Cc Im 1 Month 12)  Ferrous Sulfate 325 (65 Fe) Mg Tabs (Ferrous Sulfate) .Marland Kitchen.. 1po Once Daily  Allergies (verified): No Known Drug Allergies  Past History:  Past Medical History: Reviewed history from 01/03/2010 and no changes required. COPD Coronary artery disease s/p 4V CABG 1995, multiple stenting procedures post CABG Hyperlipidemia Renal Artery Stenosis-bilateral  stents GERD Cerebrovascular accident, hx of hx of bilat vertebral artery stenosis Peripheral vascular disease-severe lower ext disease s/p 2 failed left fem-pop bypasses, mesenteric bypass, iliac artery stening, bilateral carotid endarterectomy Hypertension Moderate mitral regurgitation EGD 1997 and 2004 - erosive esophagitis, antral ulcer Anxiety Depression glucose intolerance hx of contrast induced nephropathy Anemia-iron deficiency hx of coumadin tx - stopped due to GI blood loss Diverticulosis, colon Colonic polyps, hx of - Dr Leone Payor intestinal angina s/p mesenteric bypass 2010 Anemia-chronic and iron deficient  Past Surgical History: Reviewed history from 11/10/2009 and no changes required. Coronary artery bypass graft- 1995 4V Renal Artery Stenting- 2004 - bilat s/p multiple LE vascular bypass (includine fem-pop x 2 LLE) s/p bilateral CEA s/p PTCA to the SVG to DIAG, SVG to PDA and SVG to OM Appendectomy s/p lumbar disc surgury x 2 s/p RLE fem bypass feb 2011   Family History: Reviewed history from 09/23/2007 and no changes required. 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   Vital Signs:  Patient profile:   76 year old male Height:      72 inches Weight:      165.8 pounds BMI:     22.57 Pulse rate:   68 / minute Pulse rhythm:   regular BP sitting:   130 / 58  (right arm) Cuff size:   regular  Vitals Entered By: Harlow Mares CMA [facility] Dull) (February 07, 2010 1:44 PM)  Physical Exam  General:  alert and well-developed.    Eyes:   no icterus. Mouth:  edentulous and free of lesions Neck:  supple and no masses.   Lungs:  Clear throughout to auscultation. Heart:  Regular rate and rhythm; no murmurs, rubs,  or bruits. Abdomen:  Soft, nontender and nondistended. No masses, hepatosplenomegaly or hernias noted. Normal bowel sounds. Several loud bruits heard in flanks anbd periumbilical areas Rectal:  deferred until time of colonoscopy.   Extremities:  warm, no edema Psych:  Alert and cooperative. Normal mood and affect.  I have reviewed hospital dc summary, labs, cardiac studies, old office notes, endoscopy and pathology reports  Impression & Recommendations:  Problem # 1:  ANEMIA, IRON DEFICIENCY (ICD-280.9) Assessment Comment Only NEW: ferritin 7 in spring when anemic EGD and colonoscopy are indicated to look for cause of iron/blood loss. Risks, benefits,and indications of endoscopic procedure(s) were reviewed with the patient and all questions answered. Increased risks of vascular problems due to need to hold Plavix 5-7 days were discussed. He understands. Will ask Dr. Sanjuana Kava re: Plavix hold    Orders: Colon/Endo (Colon/Endo)  Problem # 2:  VITAMIN B12 DEFICIENCY (ICD-266.2) Assessment: Comment Only New to me: improved presumably after injectons may be part of anemia also raises ? of malabsdsorption vs. inadequate intake he had duodenal inflammation of unclear etiology in past, I had thought it  was related to ischemuia to reassess with EGD  Problem # 3:  TUBULOVILLOUS ADENOMA, COLON, HX OF (ICD-V12.72) Assessment: Unchanged large rectal TV adenoma needs follow-up and now will allow Risks, benefits,and indications of endoscopic procedure(s) were reviewed with the patient and all questions answered.  Orders: Colon/Endo (Colon/Endo)  Problem # 4:  CORONARY ARTERY DISEASE (ICD-414.00) Assessment: Unchanged  Problem # 5:  PERIPHERAL VASCULAR DISEASE (ICD-443.9) Assessment:  Unchanged  Patient Instructions: 1)  Please pick up your medications at your pharmacy.  2)  We will see you at your procedure on 03/01/10. 3)  You will be contacted by our office prior to your procedure for directions on holding your Plavix.  If you do not hear from our office 1 week prior to your scheduled procedure, please call [phone] to discuss.  4)  [facility] Patient Information Guide given to patient.  5)  Colonoscopy and Flexible Sigmoidoscopy brochure given.  6)  Upper Endoscopy brochure given.  7)  Copy sent to : Verne Carrow, MD, [name], MD 8)  The medication list was reviewed and reconciled.  All changed / newly prescribed medications were explained.  A complete medication list was provided to the patient / caregiver. Prescriptions: MOVIPREP 100 GM  SOLR (PEG-KCL-NACL-NASULF-NA ASC-C) As per prep instructions.  #1 x 0   Entered by:   Francee Piccolo CMA (AAMA)   Authorized by:   Iva Boop MD, [facility]   Signed by:   Francee Piccolo CMA (AAMA) on 02/07/2010   Method used:   [facility]        [facility] [address] [phone]* (retail)       [address]       [place], [place]  [address]       Ph: [phone] or [phone]       Fax: [phone]   RxID:   [id number]

## 2010-09-13 NOTE — Progress Notes (Signed)
  Phone Note Refill Request Message from:  Pharmacy  Refills Requested: Medication #1:  CILOSTAZOL 100 MG TABS 1po bid  Method Requested: Electronic Initial call taken by: Brenton Grills MA,  April 01, 2010 4:14 PM    Prescriptions: CILOSTAZOL 100 MG TABS (CILOSTAZOL) 1po bid  #60 Tablet x 2   Entered by:   Brenton Grills MA   Authorized by:   Corwin Levins MD   Signed by:   Brenton Grills MA on 04/01/2010   Method used:   [facility]        [facility] [address] [phone]* (retail)       [address]       [place], [place]  [address]       Ph: [phone] or [phone]       Fax: [phone]   RxID:   [id number]

## 2010-09-13 NOTE — Letter (Signed)
Summary: [facility] Consult Scheduled Letter  [facility]  [address] [place], [place] [address]   Phone: [phone]  Fax: [phone]      01/05/2010 MRN: [id number]  NESTER BACHUS [address] [place], [place]  [address]    Dear Mr. Roethler,      We have scheduled an appointment for you.  At the recommendation of Dr.John, we have scheduled you a consult with Dr Leone Payor on 02/07/10 at 1:30pm.  Their phone number is [phone].  If this appointment day and time is not convenient for you, please feel free to call the office of the doctor you are being referred to at the number listed above and reschedule the appointment.     [facility] [address] [place], [place] [address] *Gastroenterology Dept.3rd Floor*   Please give 24hr notice if you need to cancel/reschedule to avoid a $50.00 fee. Also bring insurance card and any co-pay due at time of visit.    Thank you,  Patient Care Coordinator [facility]

## 2010-09-13 NOTE — Progress Notes (Signed)
  Phone Note Call from Patient Call back at Home Phone [phone]   Caller: Patient Call For: Corwin Levins MD Summary of Call: Pt wants to know if he needs monthly b12 shots? Initial call taken by: Verdell Face,  May 03, 2010 10:50 AM  Follow-up for Phone Call        Yes, pt has been recieving monthly B-12 shots, Thanks Margaret Pyle, CMA  May 03, 2010 11:00 AM   Additional Follow-up for Phone Call Additional follow up Details #1::        B12 shot sched for 9/23. Additional Follow-up by: Verdell Face,  May 03, 2010 11:10 AM

## 2010-09-13 NOTE — Procedures (Signed)
Summary: Upper Endoscopy  Patient: [name] Note: All result statuses are Final unless otherwise noted.  Tests: (1) Upper Endoscopy (EGD)   EGD Upper Endoscopy       DONE      [facility]     [address]     [place], [place]  [address]           ENDOSCOPY PROCEDURE REPORT           PATIENT:  [name], [name]  MR#:  [id number]     BIRTHDATE:  11/30/1934, 75 yrs. old  GENDER:  male           ENDOSCOPIST:  Iva Boop, MD, [facility]           PROCEDURE DATE:  03/01/2010     PROCEDURE:  EGD with biopsy     ASA CLASS:  Class III     INDICATIONS:  iron deficiency anemia B12 deficiency also           MEDICATIONS:   Fentanyl 50 mcg IV, Versed 6 mg IV     TOPICAL ANESTHETIC:  Exactacain Spray           DESCRIPTION OF PROCEDURE:   After the risks benefits and     alternatives of the procedure were thoroughly explained, informed     consent was obtained.  The LB [id number] endoscope was     introduced through the mouth and advanced to the second portion of     the duodenum, without limitations.  The instrument was slowly     withdrawn as the mucosa was fully examined.     <<PROCEDUREIMAGES>>           Moderate gastritis was found in the body and the antrum of the     stomach. Irregular mucosa with variable color pattern, erythema     and pale in places. Multiple biopsies were obtained and sent to     pathology.  Otherwise the examination was normal. Somewhat     J-shaped, difficult to enter duodenum.    Retroflexed views     revealed no abnormalities.    The scope was then withdrawn from     the patient and the procedure completed.           COMPLICATIONS:  None           ENDOSCOPIC IMPRESSION:     1) Moderate gastritis in the body and the antrum of the stomach     - biopsied     2) Otherwise normal examination     RECOMMENDATIONS:     1) Await biopsy results     colonoscopy is next           REPEAT EXAM:  In for as needed.           Iva Boop, MD, [facility]        CC:  The Patient     [name], MD           n.     Rosalie Doctor:   Iva Boop at 03/01/2010 02:56 PM           Andreas Blower, [id number]  Note: An exclamation mark (!) indicates a result that was not dispersed into the flowsheet. Document Creation Date: 03/01/2010 2:57 PM _______________________________________________________________________  (1) Order result status: Final Collection or observation date-time: 03/01/2010 14:31 Requested date-time:  Receipt date-time:  Reported date-time:  Referring Physician:   Ordering Physician: Stan Head [id number])  Specimen Source:  Source: Launa Grill Order Number: [id number] Lab site:

## 2010-09-13 NOTE — Progress Notes (Signed)
Summary: alt med  Phone Note Call from Patient Call back at Home Phone [phone]   Caller: Patient Summary of Call: pt states that Insurance Co will no longer cover pt's Crestor. pt is requesting alt RX sent to [facility] Initial call taken by: Margaret Pyle, CMA,  October 12, 2009 3:44 PM  Follow-up for Phone Call        ok to change to lipitor 20 once daily , ROV in 1 months  - done escript Follow-up by: Corwin Levins MD,  October 12, 2009 4:00 PM  Additional Follow-up for Phone Call Additional follow up Details #1::        pt's spouse informed and will have pt call back and schedule appt. Additional Follow-up by: Margaret Pyle, CMA,  October 12, 2009 4:04 PM    New/Updated Medications: LIPITOR 20 MG TABS (ATORVASTATIN CALCIUM) 1po once daily Prescriptions: LIPITOR 20 MG TABS (ATORVASTATIN CALCIUM) 1po once daily  #30 x 11   Entered and Authorized by:   Corwin Levins MD   Signed by:   Corwin Levins MD on 10/12/2009   Method used:   [facility]        [facility] [address] [phone]* (retail)       [address]       [place], [place]  [address]       Ph: [phone] or [phone]       Fax: [phone]   RxID:   [id number]

## 2010-09-13 NOTE — Assessment & Plan Note (Signed)
Summary: 1 MOS F/U / # / CD   Vital Signs:  Patient profile:   76 year old male Height:      72 inches Weight:      162.25 pounds BMI:     22.08 O2 Sat:      95 % on Room air Temp:     98.4 degrees F oral Pulse rate:   68 / minute BP sitting:   128 / 50  (left arm) Cuff size:   regular  Vitals Entered ByZella Ball Ewing (January 31, 2010 9:58 AM)  O2 Flow:  Room air  CC: 1 month followup/RE   Primary Care Provider:  Priscille Heidelberg, MD  CC:  1 month followup/RE.  History of Present Illness: here to f/u; overall doing ok, does plan to keep Dr Brennan Bailey appt june 27;  did not see card/mcalhany as he did not want to be asked to have adenosine stress test done;  has been compliact with meds including the iron;  has new low B12 and to start tx today;  stamina overall much improved without sob/doe,  denies CP but minimally active;  Pt denies wheezing, orthopnea, pnd, worsening LE edema, palps, dizziness or syncope .  Reflux symtpoms improved, denies dysphagia, wt loss, other abd pain but has had some intermittent recent loose stools.  No BRBPR  Problems Prior to Update: 1)  Chest Pain  (ICD-786.50) 2)  Hepatotoxicity, Drug-induced, Risk of  (ICD-V58.69) 3)  Vitamin B12 Deficiency  (ICD-266.2) 4)  Anemia-nos  (ICD-285.9) 5)  Preventive Health Care  (ICD-V70.0) 6)  Abdominal Pain, Epigastric  (ICD-789.06) 7)  Chronic Obstructive Pulmonary Disease, Acute Exacerbation  (ICD-491.21) 8)  Special Screening Malig Neoplasms Other Sites  (ICD-V76.49) 9)  Urinary Retention  (ICD-788.20) 10)  Fatigue  (ICD-780.79) 11)  Hypokalemia  (ICD-276.8) 12)  Mitral Regurgitation  (ICD-396.3) 13)  Disease, Peripheral Vascular Nec  (ICD-443.89) 14)  Peripheral Vascular Disease  (ICD-443.9) 15)  Mi  (ICD-410.90) 16)  Coronary Artery Disease  (ICD-414.00) 17)  COPD  (ICD-496) 18)  Hypercholesterolemia  (ICD-272.0) 19)  Hypertension  (ICD-401.9) 20)  Mesenteric Vascular Insufficiency  (ICD-557.1) 21)   Cerebrovascular Accident, Hx of  (ICD-V12.50) 22)  Renal Artery Stenosis  (ICD-440.1) 23)  Hiatal Hernia  (ICD-553.3) 24)  Pud  (ICD-533.90) 25)  Hypokalemia  (ICD-276.8) 26)  Colonic Polyps, Hx of  (ICD-V12.72) 27)  Diverticulosis, Colon  (ICD-562.10) 28)  Anemia-iron Deficiency  (ICD-280.9) 29)  Glucose Intolerance  (ICD-271.3) 30)  Depression  (ICD-311) 31)  Anxiety  (ICD-300.00) 32)  Gerd  (ICD-530.81) 33)  Hemorrhoids  (ICD-455.6)  Medications Prior to Update: 1)  Cilostazol 100 Mg Tabs (Cilostazol) .Marland Kitchen.. 1po Bid 2)  Lipitor 20 Mg Tabs (Atorvastatin Calcium) .Marland Kitchen.. 1po Once Daily 3)  Isosorbide Mononitrate Cr 60 Mg Tb24 (Isosorbide Mononitrate) .Marland Kitchen.. 1po Qd 4)  Lisinopril 20 Mg Tabs (Lisinopril) .Marland Kitchen.. 1po Qd 5)  Metoprolol Succinate 100 Mg Tb24 (Metoprolol Succinate) .Marland Kitchen.. 1 By Mouth Qd 6)  Plavix 75 Mg Tabs (Clopidogrel Bisulfate) .Marland Kitchen.. 1 Tab Once Daily 7)  Nitroglycerin 0.4 Mg Subl (Nitroglycerin) .Marland Kitchen.. 1 Tab Sl As Needed 8)  Proair Hfa 108 (90 Base) Mcg/act Aers (Albuterol Sulfate) .... 2 Puffs Four Times Per Day As Needed 9)  Aspir-Low 81 Mg Tbec (Aspirin) .Marland Kitchen.. 1 By Mouth Once Daily 10)  Omeprazole 20 Mg Cpdr (Omeprazole) .Marland Kitchen.. 1 By Mouth Once Daily 11)  Cyanocobalamin 1000 Mcg/ml Soln (Cyanocobalamin) .Marland Kitchen.. 1 Cc Im 1 Month  Current Medications (verified): 1)  Cilostazol  100 Mg Tabs (Cilostazol) .Marland Kitchen.. 1po Bid 2)  Lipitor 20 Mg Tabs (Atorvastatin Calcium) .Marland Kitchen.. 1po Once Daily 3)  Isosorbide Mononitrate Cr 60 Mg Tb24 (Isosorbide Mononitrate) .Marland Kitchen.. 1po Qd 4)  Lisinopril 20 Mg Tabs (Lisinopril) .Marland Kitchen.. 1po Qd 5)  Metoprolol Succinate 100 Mg Tb24 (Metoprolol Succinate) .Marland Kitchen.. 1 By Mouth Qd 6)  Plavix 75 Mg Tabs (Clopidogrel Bisulfate) .Marland Kitchen.. 1 Tab Once Daily 7)  Nitroglycerin 0.4 Mg Subl (Nitroglycerin) .Marland Kitchen.. 1 Tab Sl As Needed 8)  Proair Hfa 108 (90 Base) Mcg/act Aers (Albuterol Sulfate) .... 2 Puffs Four Times Per Day As Needed 9)  Aspir-Low 81 Mg Tbec (Aspirin) .Marland Kitchen.. 1 By Mouth Once Daily 10)   Omeprazole 20 Mg Cpdr (Omeprazole) .Marland Kitchen.. 1 By Mouth Once Daily 11)  Cyanocobalamin 1000 Mcg/ml Soln (Cyanocobalamin) .Marland Kitchen.. 1 Cc Im 1 Month 12)  Ferrous Sulfate 325 (65 Fe) Mg Tabs (Ferrous Sulfate) .Marland Kitchen.. 1po Once Daily  Allergies (verified): No Known Drug Allergies  Past History:  Past Medical History: Last updated: 01/03/2010 COPD Coronary artery disease s/p 4V CABG 1995, multiple stenting procedures post CABG Hyperlipidemia Renal Artery Stenosis-bilateral stents GERD Cerebrovascular accident, hx of hx of bilat vertebral artery stenosis Peripheral vascular disease-severe lower ext disease s/p 2 failed left fem-pop bypasses, mesenteric bypass, iliac artery stening, bilateral carotid endarterectomy Hypertension Moderate mitral regurgitation EGD 1997 and 2004 - erosive esophagitis, antral ulcer Anxiety Depression glucose intolerance hx of contrast induced nephropathy Anemia-iron deficiency hx of coumadin tx - stopped due to GI blood loss Diverticulosis, colon Colonic polyps, hx of - Dr Leone Payor intestinal angina s/p mesenteric bypass 2010 Anemia-chronic and iron deficient  Past Surgical History: Last updated: 11/10/2009 Coronary artery bypass graft- 1995 4V Renal Artery Stenting- 2004 - bilat s/p multiple LE vascular bypass (includine fem-pop x 2 LLE) s/p bilateral CEA s/p PTCA to the SVG to DIAG, SVG to PDA and SVG to OM Appendectomy s/p lumbar disc surgury x 2 s/p RLE fem bypass feb 2011   Social History: Last updated: 01/22/2009 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   Head:  normocephalic and atraumatic.   Eyes:  vision grossly intact, pupils equal, and pupils round.     Ears:  R ear normal and L ear normal.   Nose:  no external deformity.   Neck:  supple and no masses.   Lungs:  normal respiratory effort, R decreased breath sounds, and L decreased breath sounds.   Heart:  normal rate and regular rhythm.   Abdomen:  soft, non-tender, and normal bowel sounds.   Extremities:  no edema, no erythema    Impression & Recommendations:  Problem # 1:  ANEMIA-NOS (ICD-285.9)  His updated medication list for this problem includes:    Cyanocobalamin 1000 Mcg/ml Soln (Cyanocobalamin) .Marland Kitchen... 1 cc im 1 month    Ferrous Sulfate 325 (65 Fe) Mg Tabs (Ferrous sulfate) .Marland Kitchen... 1po once daily  Orders: TLB-CBC Platelet - w/Differential (85025-CBCD)  Hgb: 9.9 (01/19/2010)   Hct: 30.6 (01/19/2010)   Platelets: 224.0 (01/19/2010) RBC: 3.31 (01/19/2010)   RDW: 22.4 (01/19/2010)   WBC: 6.5 (01/19/2010) MCV: 92.5 (01/19/2010)   MCHC: 32.4 (01/19/2010) Iron: 33 (01/03/2010)   % Sat: 9.2 (01/03/2010) B12: 137 (01/03/2010)  Folate: 5.5 (01/03/2010)   TSH: 1.46 (01/22/2009) ok to decrease the iron to once per day, check cbc but y  Problem # 2:  HYPERTENSION (ICD-401.9)  His updated medication list for this problem includes:    Lisinopril 20 Mg Tabs (Lisinopril) .Marland Kitchen... 1po qd    Metoprolol Succinate 100 Mg Tb24 (Metoprolol succinate) .Marland Kitchen... 1 by mouth qd  Orders: TLB-BMP (Basic Metabolic Panel-BMET) (80048-METABOL)  BP today: 128/50 Prior BP: 122/48 (01/03/2010)  Labs Reviewed: K+: 4.3 (01/22/2009) Creat: : 0.8 (01/22/2009)   Chol: 125 (01/22/2009)   HDL: 54.40 (01/22/2009)   LDL: 53 (01/22/2009)   TG: 88.0 (01/22/2009) stable overall by hx and exam, ok to continue meds/tx as is   Problem # 3:  HYPERCHOLESTEROLEMIA (ICD-272.0)  His updated medication list for this problem includes:    Lipitor 20 Mg Tabs (Atorvastatin calcium) .Marland Kitchen... 1po once daily  Orders: TLB-Lipid Panel (80061-LIPID)  Labs Reviewed: SGOT: 23 (01/22/2009)   SGPT: 19 (01/22/2009)   HDL:54.40  (01/22/2009), 37.3 (09/23/2007)  LDL:53 (01/22/2009), 67 (09/23/2007)  Chol:125 (01/22/2009), 120 (09/23/2007)  Trig:88.0 (01/22/2009), 77 (09/23/2007) stable overall by hx and exam, ok to continue meds/tx as is   Problem # 4:  CHEST PAIN (ICD-786.50)  with prior hospn, none recent per pt , due for adenosine stress eval - will order, pt still declines to see cardiology  Orders: Cardiolite (Cardiolite)  Complete Medication List: 1)  Cilostazol 100 Mg Tabs (Cilostazol) .Marland Kitchen.. 1po bid 2)  Lipitor 20 Mg Tabs (Atorvastatin calcium) .Marland Kitchen.. 1po once daily 3)  Isosorbide Mononitrate Cr 60 Mg Tb24 (Isosorbide mononitrate) .Marland Kitchen.. 1po qd 4)  Lisinopril 20 Mg Tabs (Lisinopril) .Marland Kitchen.. 1po qd 5)  Metoprolol Succinate 100 Mg Tb24 (Metoprolol succinate) .Marland Kitchen.. 1 by mouth qd 6)  Plavix 75 Mg Tabs (Clopidogrel bisulfate) .Marland Kitchen.. 1 tab once daily 7)  Nitroglycerin 0.4 Mg Subl (Nitroglycerin) .Marland Kitchen.. 1 tab sl as needed 8)  Proair Hfa 108 (90 Base) Mcg/act Aers (Albuterol sulfate) .... 2 puffs four times per day as needed 9)  Aspir-low 81 Mg Tbec (Aspirin) .Marland Kitchen.. 1 by mouth once daily 10)  Omeprazole 20 Mg Cpdr (Omeprazole) .Marland Kitchen.. 1 by mouth once daily 11)  Cyanocobalamin 1000 Mcg/ml Soln (Cyanocobalamin) .Marland Kitchen.. 1 cc im 1 month 12)  Ferrous Sulfate 325 (65 Fe) Mg Tabs (Ferrous sulfate) .Marland Kitchen.. 1po once daily  Other Orders: Vit B12 1000 mcg (J3420) Admin of Therapeutic Inj  intramuscular or subcutaneous (956[address]) TLB-PSA (Prostate Specific Antigen) (84153-PSA) TLB-Hepatic/Liver Function Pnl (80076-HEPATIC)  Patient Instructions: 1)  OK to decrease the iron pill to one per day 2)  you had the B12 shot today 3)  please return monthly for the nurse to day monthly B12 shots 4)  please keep your appt with Dr Leone Payor june 27 5)  Please go to the Lab in the basement for your blood tests today  6)  You will be contacted about the referral(s) to: stress test 7)  Continue all previous medications as before this visit  8)  Please  schedule a follow-up appointment in 2 months   Medication Administration  Injection # 1:    Medication: Vit B12 1000 mcg    Diagnosis: VITAMIN B12 DEFICIENCY (ICD-266.2)    Route: IM    Site: R deltoid    Exp Date: 11/2011    Lot #: [id number]    Mfr: American Regent    Given by: Zella Ball Ewing (January 31, 2010 11:06 AM)  Orders Added: 1)  Vit B12 1000 mcg [J3420] 2)  Admin  of Therapeutic Inj  intramuscular or subcutaneous [96372] 3)  Cardiolite [Cardiolite] 4)  TLB-PSA (Prostate Specific Antigen) [84153-PSA] 5)  TLB-CBC Platelet - w/Differential [85025-CBCD] 6)  TLB-BMP (Basic Metabolic Panel-BMET) [80048-METABOL] 7)  TLB-Lipid Panel [80061-LIPID] 8)  TLB-Hepatic/Liver Function Pnl [80076-HEPATIC] 9)  Est. Patient Level IV [[address]]

## 2010-09-13 NOTE — Letter (Signed)
Summary: [facility] Instructions  [facility]  [address] [place], [place] [address]   Phone: [phone]  Fax: [phone]       [name]    1935-06-03    MRN: [id number]      Procedure Day Dorna Bloom: Jake Shark, 03/01/10     Arrival Time: 1:00 PM      Procedure Time: 2:00 PM    Location of Procedure:                    _X_  [facility] (4th Floor)  PREPARATION FOR COLONOSCOPY WITH MOVIPREP   Starting 5 days prior to your procedure 02/24/10 do not eat nuts, seeds, popcorn, corn, beans, peas,  salads, or any raw vegetables.  Do not take any fiber supplements (e.g. Metamucil, Citrucel, and Benefiber).  THE DAY BEFORE YOUR PROCEDURE         TUESDAY, 02/28/10  1.  Drink clear liquids the entire day-NO SOLID FOOD  2.  Do not drink anything colored red or purple.  Avoid juices with pulp.  No orange juice.  3.  Drink at least 64 oz. (8 glasses) of fluid/clear liquids during the day to prevent dehydration and help the prep work efficiently.  CLEAR LIQUIDS INCLUDE: Water Jello Ice Popsicles Tea (sugar ok, no milk/cream) Powdered fruit flavored drinks Coffee (sugar ok, no milk/cream) Gatorade Juice: apple, white grape, white cranberry  Lemonade Clear bullion, consomm, broth Carbonated beverages (any kind) Strained chicken noodle soup Hard Candy                             4.  In the morning, mix first dose of MoviPrep solution:    Empty 1 Pouch A and 1 Pouch B into the disposable container    Add lukewarm drinking water to the top line of the container. Mix to dissolve    Refrigerate (mixed solution should be used within 24 hrs)  5.  Begin drinking the prep at 5:00 p.m. The MoviPrep container is divided by 4 marks.   Every 15 minutes drink the solution down to the next mark (approximately 8 oz) until the full liter is complete.   6.  Follow completed prep with 16 oz of clear liquid of your choice (Nothing red or purple).  Continue to drink clear  liquids until bedtime.  7.  Before going to bed, mix second dose of MoviPrep solution:    Empty 1 Pouch A and 1 Pouch B into the disposable container    Add lukewarm drinking water to the top line of the container. Mix to dissolve    Refrigerate  THE DAY OF YOUR PROCEDURE      WEDNESDAY, 03/01/10  Beginning at 9:00 a.m. (5 hours before procedure):         1. Every 15 minutes, drink the solution down to the next mark (approx 8 oz) until the full liter is complete.  2. Follow completed prep with 16 oz. of clear liquid of your choice.    3. You may drink clear liquids until 12:00 PM (2 HOURS BEFORE PROCEDURE).  MEDICATION INSTRUCTIONS  Unless otherwise instructed, you should take regular prescription medications with a small sip of water   as early as possible the morning of your procedure.  Stop taking Plavix or Aggrenox on  _  _  (7 days before procedure).  You will be contacted by our office prior to your procedure for  directions on holding your Plavix.  If you do not hear from our office 1 week prior to your scheduled procedure, please call [phone] to discuss.         OTHER INSTRUCTIONS  You will need a responsible adult at least 74 years of age to accompany you and drive you home.   This person must remain in the waiting room during your procedure.  Wear loose fitting clothing that is easily removed.  Leave jewelry and other valuables at home.  However, you may wish to bring a book to read or  an iPod/MP3 player to listen to music as you wait for your procedure to start.  Remove all body piercing jewelry and leave at home.  Total time from sign-in until discharge is approximately 2-3 hours.  You should go home directly after your procedure and rest.  You can resume normal activities the  day after your procedure.  The day of your procedure you should not:   Drive   Make legal decisions   Operate machinery   Drink alcohol   Return to work  You will  receive specific instructions about eating, activities and medications before you leave.  The above instructions have been reviewed and explained to me by   _______________________ I fully understand and can verbalize these instructions _____________________________ Date _________

## 2010-09-13 NOTE — Assessment & Plan Note (Signed)
Summary: PER WIFE B12--JWJ  [facility]C  Nurse Visit   Allergies: No Known Drug Allergies  Medication Administration  Injection # 1:    Medication: Vit B12 1000 mcg    Diagnosis: VITAMIN B12 DEFICIENCY (ICD-266.2)    Route: IM    Site: L deltoid    Exp Date: 03/14/2012    Lot #: [id number]    Mfr: American Regent    Patient tolerated injection without complications    Given by: Margaret Pyle, CMA (July 13, 2010 8:18 AM)  Orders Added: 1)  Admin of Therapeutic Inj  intramuscular or subcutaneous [96372] 2)  Vit B12 1000 mcg [J3420]

## 2010-09-13 NOTE — Assessment & Plan Note (Signed)
Summary: B12 / [name]  Nurse Visit   Allergies: No Known Drug Allergies  Medication Administration  Injection # 1:    Medication: Vit B12 1000 mcg    Diagnosis: VITAMIN B12 DEFICIENCY (ICD-266.2)    Route: IM    Site: L deltoid    Exp Date: 11/13/2011    Lot #: [id number]    Mfr: American Regent    Patient tolerated injection without complications    Given by: Margaret Pyle, CMA (March 03, 2010 10:22 AM)  Orders Added: 1)  Admin of Therapeutic Inj  intramuscular or subcutaneous [96372] 2)  Vit B12 1000 mcg [J3420]

## 2010-09-13 NOTE — Assessment & Plan Note (Signed)
Summary: f/u appt/ # / cd   Vital Signs:  Patient profile:   76 year old male Height:      72 inches Weight:      165 pounds BMI:     22.46 O2 Sat:      97 % on Room air Temp:     97.7 degrees F oral Pulse rate:   79 / minute BP sitting:   142 / 64  (left arm) Cuff size:   regular  Vitals Entered ByZella Ball Ewing (November 10, 2009 9:38 AM)  O2 Flow:  Room air  Preventive Care Screening  Last Pneumovax:    Date:  08/14/2008    Results:  given   CC: followup/RE   Primary Care Provider:  Priscille Heidelberg, MD  CC:  followup/RE.  History of Present Illness: overall doing well, no complaints today except mild "gas" pains and some difficulty with sleeping at night staying asleep; has some mild epigastric pain and tender but without n/v, dysphagia, wt loss, other abd pain, bowel habit change, or blood.  Overall good compliance with meds, tolerates well.  Here for wellness Diet: Heart Healthy or DM if diabetic Physical Activities: Sedentary Depression/mood screen: mild, but declines tx Hearing: rigt heraing loss - mild Visual Acuity: moderate to severe decreased  - macular degeneration ADL's: Capable  Fall Risk: None Home Safety: Good End-of-Life Planning: Advance directive - Full code/I agree  Problems Prior to Update: 1)  Abdominal Pain, Epigastric  (ICD-789.06) 2)  Chronic Obstructive Pulmonary Disease, Acute Exacerbation  (ICD-491.21) 3)  Special Screening Malig Neoplasms Other Sites  (ICD-V76.49) 4)  Urinary Retention  (ICD-788.20) 5)  Fatigue  (ICD-780.79) 6)  Hypokalemia  (ICD-276.8) 7)  Mitral Regurgitation  (ICD-396.3) 8)  Disease, Peripheral Vascular Nec  (ICD-443.89) 9)  Peripheral Vascular Disease  (ICD-443.9) 10)  Mi  (ICD-410.90) 11)  Coronary Artery Disease  (ICD-414.00) 12)  COPD  (ICD-496) 13)  Hypercholesterolemia  (ICD-272.0) 14)  Hypertension  (ICD-401.9) 15)  Mesenteric Vascular Insufficiency  (ICD-557.1) 16)  Cerebrovascular Accident, Hx of   (ICD-V12.50) 17)  Renal Artery Stenosis  (ICD-440.1) 18)  Hiatal Hernia  (ICD-553.3) 19)  Pud  (ICD-533.90) 20)  Hypokalemia  (ICD-276.8) 21)  Colonic Polyps, Hx of  (ICD-V12.72) 22)  Diverticulosis, Colon  (ICD-562.10) 23)  Anemia-iron Deficiency  (ICD-280.9) 24)  Glucose Intolerance  (ICD-271.3) 25)  Depression  (ICD-311) 26)  Anxiety  (ICD-300.00) 27)  Gerd  (ICD-530.81) 28)  Hemorrhoids  (ICD-455.6)  Medications Prior to Update: 1)  Cilostazol 100 Mg Tabs (Cilostazol) .Marland Kitchen.. 1po Bid 2)  Lipitor 20 Mg Tabs (Atorvastatin Calcium) .Marland Kitchen.. 1po Once Daily 3)  Isosorbide Mononitrate Cr 60 Mg Tb24 (Isosorbide Mononitrate) .Marland Kitchen.. 1po Qd 4)  Lisinopril 20 Mg Tabs (Lisinopril) .Marland Kitchen.. 1po Qd 5)  Metoprolol Succinate 100 Mg Tb24 (Metoprolol Succinate) .Marland Kitchen.. 1 By Mouth Qd 6)  Aspirin Ec 325 Mg Tbec (Aspirin) .... Take One Tablet By Mouth Daily 7)  Plavix 75 Mg Tabs (Clopidogrel Bisulfate) .Marland Kitchen.. 1 Tab Once Daily 8)  Nitroglycerin 0.4 Mg Subl (Nitroglycerin) .Marland Kitchen.. 1 Tab Sl As Needed 9)  Flomax 0.4 Mg Xr24h-Cap (Tamsulosin Hcl) .Marland Kitchen.. 1 By Mouth At Bedtime 10)  Proair Hfa 108 (90 Base) Mcg/act Aers (Albuterol Sulfate) .... 2 Puffs Four Times Per Day As Needed  Current Medications (verified): 1)  Cilostazol 100 Mg Tabs (Cilostazol) .Marland Kitchen.. 1po Bid 2)  Lipitor 20 Mg Tabs (Atorvastatin Calcium) .Marland Kitchen.. 1po Once Daily 3)  Isosorbide Mononitrate Cr 60 Mg Tb24 (Isosorbide Mononitrate) .Marland KitchenMarland KitchenMarland Kitchen  1po Qd 4)  Lisinopril 20 Mg Tabs (Lisinopril) .Marland Kitchen.. 1po Qd 5)  Metoprolol Succinate 100 Mg Tb24 (Metoprolol Succinate) .Marland Kitchen.. 1 By Mouth Qd 6)  Aspirin Ec 325 Mg Tbec (Aspirin) .... Take One Tablet By Mouth Daily 7)  Plavix 75 Mg Tabs (Clopidogrel Bisulfate) .Marland Kitchen.. 1 Tab Once Daily 8)  Nitroglycerin 0.4 Mg Subl (Nitroglycerin) .Marland Kitchen.. 1 Tab Sl As Needed 9)  Flomax 0.4 Mg Xr24h-Cap (Tamsulosin Hcl) .Marland Kitchen.. 1 By Mouth At Bedtime 10)  Proair Hfa 108 (90 Base) Mcg/act Aers (Albuterol Sulfate) .... 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 COPD Coronary artery disease s/p 4V CABG 1995, multiple stenting procedures post CABG Hyperlipidemia Renal Artery Stenosis-bilateral stents GERD Cerebrovascular accident, hx of hx of bilat vertebral artery stenosis Peripheral vascular disease-severe lower ext disease s/p 2 failed left fem-pop bypasses, mesenteric bypass, iliac artery stening, bilateral carotid endarterectomy Hypertension Moderate mitral regurgitation EGD 1997 and 2004 - erosive esophagitis, antral ulcer Anxiety Depression glucose intolerance hx of contrast induced nephropathy Anemia-iron deficiency hx of coumadin tx - stopped due to GI blood loss Diverticulosis, colon Colonic polyps, hx of intestinal angina s/p mesenteric bypass 2010  Past Surgical History: Coronary artery bypass graft- 1995 4V Renal Artery Stenting- 2004 - bilat s/p multiple LE vascular bypass (includine fem-pop x 2 LLE) s/p bilateral CEA s/p PTCA to the SVG to DIAG, SVG to PDA and SVG to OM Appendectomy s/p lumbar disc surgury x 2 s/p RLE fem bypass feb 2011   Review of Systems  The patient denies anorexia, fever, weight loss, weight gain, vision loss, decreased hearing, hoarseness, chest pain, syncope, peripheral edema, prolonged cough, headaches, hemoptysis, melena, hematochezia, severe indigestion/heartburn, hematuria, muscle weakness, suspicious skin lesions, difficulty  walking, unusual weight change, abnormal bleeding, enlarged lymph nodes, and angioedema.         all otherwise negative per pt -    Physical Exam  General:  alert and well-developed.   Head:  normocephalic and atraumatic.   Eyes:  vision grossly intact, pupils equal, and pupils round.   Ears:  R ear normal and L ear normal.   Nose:  no external deformity and no nasal discharge.   Mouth:  no gingival abnormalities and pharynx pink and moist.   Neck:  supple and no masses.   Lungs:  normal respiratory effort, R decreased breath sounds, and L decreased breath sounds.   Heart:  normal rate and regular rhythm.   Abdomen:  soft and normal bowel sounds.  with midl epigastric tender Msk:  no joint tenderness and no joint swelling.   Pulses:  trace to 1+ bilat pedal pulses Extremities:  no edema, no erythema  Neurologic:  cranial nerves II-XII intact and strength normal in all extremities.   Skin:  color normal and no rashes.   Psych:  memory intact for recent and remote and not anxious appearing.     Impression & Recommendations:  Problem # 1:  Preventive Health Care (ICD-V70.0)  Overall doing well, age  appropriate education and counseling updated and referral for appropriate preventive services done unless declined, immunizations up to date or declined, diet counseling done if overweight, urged to quit smoking if smokes , most recent labs reviewed and current ordered if appropriate, ecg reviewed or declined (interpretation per ECG scanned in the EMR if done); information regarding Medicare Prevention requirements given if appropriate   Orders: First annual wellness visit with prevention plan  (O1308)  Problem # 2:  ABDOMINAL PAIN, EPIGASTRIC (ICD-789.06) for trial sample PPI - samples prilosec given  Problem # 3:  DISEASE, PERIPHERAL VASCULAR NEC (ICD-443.89) stable overall by hx and exam, ok to continue meds/tx as is   Problem # 4:  COPD (ICD-496)  His updated medication list for  this problem includes:    Proair Hfa 108 (90 Base) Mcg/act Aers (Albuterol sulfate) .Marland Kitchen... 2 puffs four times per day as needed stable overall by hx and exam, ok to continue meds/tx as is , declines spiriva trial  Problem # 5:  DEPRESSION (ICD-311) mild, declines SSRI trial  Complete Medication List: 1)  Cilostazol 100 Mg Tabs (Cilostazol) .Marland Kitchen.. 1po bid 2)  Lipitor 20 Mg Tabs (Atorvastatin calcium) .Marland Kitchen.. 1po once daily 3)  Isosorbide Mononitrate Cr 60 Mg Tb24 (Isosorbide mononitrate) .Marland Kitchen.. 1po qd 4)  Lisinopril 20 Mg Tabs (Lisinopril) .Marland Kitchen.. 1po qd 5)  Metoprolol Succinate 100 Mg Tb24 (Metoprolol succinate) .Marland Kitchen.. 1 by mouth qd 6)  Aspirin Ec 325 Mg Tbec (Aspirin) .... Take one tablet by mouth daily 7)  Plavix 75 Mg Tabs (Clopidogrel bisulfate) .Marland Kitchen.. 1 tab once daily 8)  Nitroglycerin 0.4 Mg Subl (Nitroglycerin) .Marland Kitchen.. 1 tab sl as needed 9)  Flomax 0.4 Mg Xr24h-cap (Tamsulosin hcl) .Marland Kitchen.. 1 by mouth at bedtime 10)  Proair Hfa 108 (90 Base) Mcg/act Aers (Albuterol sulfate) .... 2 puffs four times per day as needed  Other Orders: Prescription Created Electronically [id number])  Patient Instructions: 1)  Please take all new medications as prescribed - the omeprazole samples 2)  Continue all previous medications as before this visit  3)  Please schedule a follow-up appointment in 4 months (we'll need blood work next visit)

## 2010-09-13 NOTE — Assessment & Plan Note (Signed)
Summary: 2 MTH FU--STC   Vital Signs:  Patient profile:   76 year old male Height:      72 inches Weight:      167.50 pounds BMI:     22.80 O2 Sat:      96 % on Room air Temp:     98.1 degrees F oral BP sitting:   142 / 58  (left arm) Cuff size:   regular  Vitals Entered By: Zella Ball Ewing CMA Duncan Dull) (March 29, 2010 10:33 AM)  O2 Flow:  Room air CC: 2 month followup, B-12 shot/RE   Primary Care Eleora Sutherland:  [name], MD  CC:  2 month followup and B-12 shot/RE.  History of Present Illness: here to f/u with wife;  still has not seen cardiology or had stress test  - he says now due to transportation issues;  Pt denies sob, doe, wheezing, orthopnea, pnd, worsening LE edema, palps, dizziness or syncope  .  Has occas CP once per month - none for several wks, usually with emotional upset, reduced with 1 x ntg. Pt denies new neuro symptoms such as headache, facial or extremity weakness No fever, wt loss, night sweats, loss of appetite or other constitutional symptoms No new complaints.  Overall good compliance with meds, good tolerance.  No recent falls, injury, or worsening claudication symptoms. Denies worsening depressive symptoms, anxiety, or panic.      Problems Prior to Update: 1)  Chest Pain  (ICD-786.50) 2)  Hepatotoxicity, Drug-induced, Risk of  (ICD-V58.69) 3)  Vitamin B12 Deficiency  (ICD-266.2) 4)  Anemia, Iron Deficiency  (ICD-280.9) 5)  Chronic Obstructive Pulmonary Disease, Acute Exacerbation  (ICD-491.21) 6)  Urinary Retention  (ICD-788.20) 7)  Fatigue  (ICD-780.79) 8)  Hypokalemia  (ICD-276.8) 9)  Mitral Regurgitation  (ICD-396.3) 10)  Disease, Peripheral Vascular Nec  (ICD-443.89) 11)  Peripheral Vascular Disease  (ICD-443.9) 12)  Mi  (ICD-410.90) 13)  Coronary Artery Disease  (ICD-414.00) 14)  COPD  (ICD-496) 15)  Hypercholesterolemia  (ICD-272.0) 16)  Hypertension  (ICD-401.9) 17)  Mesenteric Vascular Insufficiency  (ICD-557.1) 18)  Cerebrovascular Accident, Hx  of  (ICD-V12.50) 19)  Renal Artery Stenosis  (ICD-440.1) 20)  Hiatal Hernia  (ICD-553.3) 21)  Pud  (ICD-533.90) 22)  Hypokalemia  (ICD-276.8) 23)  Tubulovillous Adenoma, Colon, Hx of  (ICD-V12.72) 24)  Diverticulosis, Colon  (ICD-562.10) 25)  Anemia-iron Deficiency  (ICD-280.9) 26)  Glucose Intolerance  (ICD-271.3) 27)  Depression  (ICD-311) 28)  Anxiety  (ICD-300.00) 29)  Gerd  (ICD-530.81) 30)  Hemorrhoids  (ICD-455.6)  Medications Prior to Update: 1)  Cilostazol 100 Mg Tabs (Cilostazol) .Marland Kitchen.. 1po Bid 2)  Lipitor 20 Mg Tabs (Atorvastatin Calcium) .Marland Kitchen.. 1po Once Daily 3)  Isosorbide Mononitrate Cr 60 Mg Tb24 (Isosorbide Mononitrate) .Marland Kitchen.. 1po Qd 4)  Lisinopril 20 Mg Tabs (Lisinopril) .Marland Kitchen.. 1po Qd 5)  Metoprolol Succinate 100 Mg Tb24 (Metoprolol Succinate) .Marland Kitchen.. 1 By Mouth Qd 6)  Plavix 75 Mg Tabs (Clopidogrel Bisulfate) .Marland Kitchen.. 1 Tab Once Daily 7)  Nitroglycerin 0.4 Mg Subl (Nitroglycerin) .Marland Kitchen.. 1 Tab Sl As Needed 8)  Proair Hfa 108 (90 Base) Mcg/act Aers (Albuterol Sulfate) .... 2 Puffs Four Times Per Day As Needed 9)  Aspir-Low 81 Mg Tbec (Aspirin) .Marland Kitchen.. 1 By Mouth Once Daily 10)  Omeprazole 20 Mg Cpdr (Omeprazole) .Marland Kitchen.. 1 By Mouth Once Daily 11)  Cyanocobalamin 1000 Mcg/ml Soln (Cyanocobalamin) .Marland Kitchen.. 1 Cc Im 1 Month 12)  Ferrous Sulfate 325 (65 Fe) Mg Tabs (Ferrous Sulfate) .Marland Kitchen.. 1po Once Daily  Current  Medications (verified): 1)  Cilostazol 100 Mg Tabs (Cilostazol) .Marland Kitchen.. 1po Bid 2)  Lipitor 20 Mg Tabs (Atorvastatin Calcium) .Marland Kitchen.. 1po Once Daily 3)  Isosorbide Mononitrate Cr 60 Mg Tb24 (Isosorbide Mononitrate) .Marland Kitchen.. 1po Qd 4)  Lisinopril 20 Mg Tabs (Lisinopril) .Marland Kitchen.. 1po Qd 5)  Metoprolol Succinate 100 Mg Tb24 (Metoprolol Succinate) .Marland Kitchen.. 1 By Mouth Qd 6)  Plavix 75 Mg Tabs (Clopidogrel Bisulfate) .Marland Kitchen.. 1 Tab Once Daily 7)  Nitroglycerin 0.4 Mg Subl (Nitroglycerin) .Marland Kitchen.. 1 Tab Sl As Needed 8)  Proair Hfa 108 (90 Base) Mcg/act Aers (Albuterol Sulfate) .... 2 Puffs Four Times Per Day As Needed 9)   Aspir-Low 81 Mg Tbec (Aspirin) .Marland Kitchen.. 1 By Mouth Once Daily 10)  Omeprazole 20 Mg Cpdr (Omeprazole) .Marland Kitchen.. 1 By Mouth Once Daily 11)  Cyanocobalamin 1000 Mcg/ml Soln (Cyanocobalamin) .Marland Kitchen.. 1 Cc Im 1 Month 12)  Ferrous Sulfate 325 (65 Fe) Mg Tabs (Ferrous Sulfate) .Marland Kitchen.. 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 Current Smoker-1 ppd Alcohol use-no No illicit drug use 2 children Daily Caffeine Use 1/2 decaf 1/2 caffine coffee a day  Risk Factors: Smoking Status: current (09/23/2007) Packs/Day: 11/2 PPD (03/02/2007)  Past Medical History: COPD Coronary artery disease s/p 4V CABG 1995, multiple stenting procedures post CABG Hyperlipidemia Renal Artery Stenosis-bilateral stents GERD Cerebrovascular accident, hx of hx of bilat vertebral artery stenosis Peripheral vascular disease-severe lower ext disease s/p 2 failed left fem-pop bypasses, mesenteric bypass, iliac artery stening, bilateral carotid endarterectomy Hypertension Moderate mitral regurgitation EGD 1997 and 2004 - erosive esophagitis, antral ulcer Anxiety Depression glucose intolerance hx of contrast induced nephropathy Anemia-iron deficiency hx of coumadin tx - stopped due to GI blood loss Diverticulosis, colon Colonic polyps, hx of - Dr Leone Payor intestinal angina s/p mesenteric bypass 2010 Anemia-chronic and iron deficient legal blindness  - retinal disease - does not drive  Past Surgical History: Reviewed history from 11/10/2009 and no changes required. Coronary artery bypass graft- 1995 4V Renal Artery Stenting- 2004 - bilat s/p multiple LE vascular bypass (includine fem-pop x 2 LLE) s/p bilateral CEA s/p PTCA to the SVG to DIAG, SVG to PDA and SVG to OM Appendectomy s/p lumbar disc surgury x 2 s/p RLE fem bypass feb 2011   Review of Systems       all otherwise  negative per pt -    Physical Exam  General:  alert and well-developed.   Head:  normocephalic and atraumatic.   Eyes:  vision grossly intact, pupils equal, and pupils round.   Ears:  R ear normal and L ear normal.   Nose:  no external deformity and no nasal discharge.   Mouth:  no gingival abnormalities and pharynx pink and moist.   Neck:  supple and no masses.   Lungs:  normal respiratory effort, R decreased breath sounds, and L decreased breath sounds.   Heart:  normal rate and regular rhythm.   Abdomen:  soft, non-tender, and normal bowel sounds.   Msk:  no joint tenderness and no joint swelling.   Extremities:  no edema, no erythema  Skin:  color normal and no rashes.   Psych:  mild depressed affect and slightly anxious.     Impression & Recommendations:  Problem # 1:  CHEST PAIN (ICD-786.50) probable stable angina  - still think pt should have stress test but he is resistant;  does say today that he  will see cardiology and will try to re-schedule though transporation is a significant issue  Problem # 2:  HYPERTENSION (ICD-401.9)  His updated medication list for this problem includes:    Lisinopril 20 Mg Tabs (Lisinopril) .Marland Kitchen... 1po qd    Metoprolol Succinate 100 Mg Tb24 (Metoprolol succinate) .Marland Kitchen... 1 by mouth qd  BP today: 142/58 Prior BP: 130/58 (02/07/2010)  Labs Reviewed: K+: 4.6 (01/31/2010) Creat: : 1.3 (01/31/2010)   Chol: 135 (01/31/2010)   HDL: 47.60 (01/31/2010)   LDL: 73 (01/31/2010)   TG: 73.0 (01/31/2010) mild elev today, pt declines change in meds - Continue all previous medications as before this visit   Problem # 3:  VITAMIN B12 DEFICIENCY (ICD-266.2) for b12 IM today Orders: Vit B12 1000 mcg (J3420) Admin of Therapeutic Inj  intramuscular or subcutaneous (04540)  Problem # 4:  COPD (ICD-496)  His updated medication list for this problem includes:    Proair Hfa 108 (90 Base) Mcg/act Aers (Albuterol sulfate) .Marland Kitchen... 2 puffs four times per day as  needed stable overall by hx and exam, ok to continue meds/tx as is   Problem # 5:  DEPRESSION (ICD-311) stable overall by hx and exam, ok to continue meds/tx as is - declines SSRI trial  Complete Medication List: 1)  Cilostazol 100 Mg Tabs (Cilostazol) .Marland Kitchen.. 1po bid 2)  Lipitor 20 Mg Tabs (Atorvastatin calcium) .Marland Kitchen.. 1po once daily 3)  Isosorbide Mononitrate Cr 60 Mg Tb24 (Isosorbide mononitrate) .Marland Kitchen.. 1po qd 4)  Lisinopril 20 Mg Tabs (Lisinopril) .Marland Kitchen.. 1po qd 5)  Metoprolol Succinate 100 Mg Tb24 (Metoprolol succinate) .Marland Kitchen.. 1 by mouth qd 6)  Plavix 75 Mg Tabs (Clopidogrel bisulfate) .Marland Kitchen.. 1 tab once daily 7)  Nitroglycerin 0.4 Mg Subl (Nitroglycerin) .Marland Kitchen.. 1 tab sl as needed 8)  Proair Hfa 108 (90 Base) Mcg/act Aers (Albuterol sulfate) .... 2 puffs four times per day as needed 9)  Aspir-low 81 Mg Tbec (Aspirin) .Marland Kitchen.. 1 by mouth once daily 10)  Omeprazole 20 Mg Cpdr (Omeprazole) .Marland Kitchen.. 1 by mouth once daily 11)  Cyanocobalamin 1000 Mcg/ml Soln (Cyanocobalamin) .Marland Kitchen.. 1 cc im 1 month 12)  Ferrous Sulfate 325 (65 Fe) Mg Tabs (Ferrous sulfate) .Marland Kitchen.. 1po once daily  Patient Instructions: 1)  you had the b12 shot today 2)  Continue all previous medications as before this visit  3)  Please call to re-schedule your appt with Dr Clifton James 4)  Please schedule a follow-up appointment in March 2012 for your "yearly medicare exam", or sooner if needed   Medication Administration  Injection # 1:    Medication: Vit B12 1000 mcg    Diagnosis: VITAMIN B12 DEFICIENCY (ICD-266.2)    Route: IM    Site: R deltoid    Exp Date: 11/2011    Lot #: [id number]    Mfr: [employer]    Patient tolerated injection without complications    Given by: Zella Ball Ewing CMA Duncan Dull) (March 29, 2010 10:34 AM)  Orders Added: 1)  Vit B12 1000 mcg [J3420] 2)  Admin of Therapeutic Inj  intramuscular or subcutaneous [96372] 3)  Est. Patient Level IV [98119]

## 2010-09-13 NOTE — Miscellaneous (Signed)
Summary: Orders Update   Clinical Lists Changes  Orders: Added new Referral order of Gastroenterology Referral (GI) - Signed 

## 2010-09-13 NOTE — Assessment & Plan Note (Signed)
Summary: POST HOSP /NEEDS CBC /PER RHONDA [facility]RETT IN CARDIOLOGY/NWS   Vital Signs:  Patient profile:   76 year old male Height:      72 inches Weight:      164 pounds BMI:     22.32 O2 Sat:      96 % on Room air Temp:     98.5 degrees F oral Pulse rate:   77 / minute BP sitting:   122 / 48  (left arm) Cuff size:   regular  Vitals Entered ByZella Ball Ewing (Jan 03, 2010 3:14 PM)  O2 Flow:  Room air  CC: Post Hospital/RE   Primary Care Provider:  Priscille Heidelberg, MD  CC:  Post Hospital/RE.  History of Present Illness: pt recetnly hosptd with CP - MI ruled out;  pt currently not taking the flomax or the proair;  asa decreaed from 325 to 81, and on new iron OTC three times a day;  pt overalldoing well since d/c with no further CP, although has some breakthrough reflux symtpoms on the pepcid without dysphagia, n/v, abd pain, bowel habit change or overt bleeding.  Was felt to have anemia with iron def and chronic dz recently and  due for f/u at this time.  Pt denies Pt denies worsening sob, doe, wheezing, orthopnea, pnd, worsening LE edema, palps, dizziness or syncope .  Pt denies new neuro symptoms such as headache, facial or extremity weakness  No other new symptoms such as worsening prostatism, fever, wt loss, night sweats, or other constitutional symtpoms.  Last colonoscopy 2008 , and pt has declined GI f/u since then with Dr Leone Payor.  Has hx of iron def due to chronic blood loss, coumadin stopped due to this.   Does plan  to f/u with cardiology soon for CP with stress test.  Problems Prior to Update: 1)  Vitamin B12 Deficiency  (ICD-266.2) 2)  Anemia-nos  (ICD-285.9) 3)  Preventive Health Care  (ICD-V70.0) 4)  Abdominal Pain, Epigastric  (ICD-789.06) 5)  Chronic Obstructive Pulmonary Disease, Acute Exacerbation  (ICD-491.21) 6)  Special Screening Malig Neoplasms Other Sites  (ICD-V76.49) 7)  Urinary Retention  (ICD-788.20) 8)  Fatigue  (ICD-780.79) 9)  Hypokalemia  (ICD-276.8) 10)   Mitral Regurgitation  (ICD-396.3) 11)  Disease, Peripheral Vascular Nec  (ICD-443.89) 12)  Peripheral Vascular Disease  (ICD-443.9) 13)  Mi  (ICD-410.90) 14)  Coronary Artery Disease  (ICD-414.00) 15)  COPD  (ICD-496) 16)  Hypercholesterolemia  (ICD-272.0) 17)  Hypertension  (ICD-401.9) 18)  Mesenteric Vascular Insufficiency  (ICD-557.1) 19)  Cerebrovascular Accident, Hx of  (ICD-V12.50) 20)  Renal Artery Stenosis  (ICD-440.1) 21)  Hiatal Hernia  (ICD-553.3) 22)  Pud  (ICD-533.90) 23)  Hypokalemia  (ICD-276.8) 24)  Colonic Polyps, Hx of  (ICD-V12.72) 25)  Diverticulosis, Colon  (ICD-562.10) 26)  Anemia-iron Deficiency  (ICD-280.9) 27)  Glucose Intolerance  (ICD-271.3) 28)  Depression  (ICD-311) 29)  Anxiety  (ICD-300.00) 30)  Gerd  (ICD-530.81) 31)  Hemorrhoids  (ICD-455.6)  Medications Prior to Update: 1)  Cilostazol 100 Mg Tabs (Cilostazol) .Marland Kitchen.. 1po Bid 2)  Lipitor 20 Mg Tabs (Atorvastatin Calcium) .Marland Kitchen.. 1po Once Daily 3)  Isosorbide Mononitrate Cr 60 Mg Tb24 (Isosorbide Mononitrate) .Marland Kitchen.. 1po Qd 4)  Lisinopril 20 Mg Tabs (Lisinopril) .Marland Kitchen.. 1po Qd 5)  Metoprolol Succinate 100 Mg Tb24 (Metoprolol Succinate) .Marland Kitchen.. 1 By Mouth Qd 6)  Aspirin Ec 325 Mg Tbec (Aspirin) .... Take One Tablet By Mouth Daily 7)  Plavix 75 Mg Tabs (Clopidogrel Bisulfate) .Marland KitchenMarland KitchenMarland Kitchen 1  Tab Once Daily 8)  Nitroglycerin 0.4 Mg Subl (Nitroglycerin) .Marland Kitchen.. 1 Tab Sl As Needed 9)  Flomax 0.4 Mg Xr24h-Cap (Tamsulosin Hcl) .Marland Kitchen.. 1 By Mouth At Bedtime 10)  Proair Hfa 108 (90 Base) Mcg/act Aers (Albuterol Sulfate) .... 2 Puffs Four Times Per Day As Needed  Current Medications (verified): 1)  Cilostazol 100 Mg Tabs (Cilostazol) .Marland Kitchen.. 1po Bid 2)  Lipitor 20 Mg Tabs (Atorvastatin Calcium) .Marland Kitchen.. 1po Once Daily 3)  Isosorbide Mononitrate Cr 60 Mg Tb24 (Isosorbide Mononitrate) .Marland Kitchen.. 1po Qd 4)  Lisinopril 20 Mg Tabs (Lisinopril) .Marland Kitchen.. 1po Qd 5)  Metoprolol Succinate 100 Mg Tb24 (Metoprolol Succinate) .Marland Kitchen.. 1 By Mouth Qd 6)  Plavix 75 Mg  Tabs (Clopidogrel Bisulfate) .Marland Kitchen.. 1 Tab Once Daily 7)  Nitroglycerin 0.4 Mg Subl (Nitroglycerin) .Marland Kitchen.. 1 Tab Sl As Needed 8)  Proair Hfa 108 (90 Base) Mcg/act Aers (Albuterol Sulfate) .... 2 Puffs Four Times Per Day As Needed 9)  Aspir-Low 81 Mg Tbec (Aspirin) .Marland Kitchen.. 1 By Mouth Once Daily 10)  Omeprazole 20 Mg Cpdr (Omeprazole) .Marland Kitchen.. 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 Current Smoker-1 ppd Alcohol use-no No illicit drug use 2 children  Risk Factors: Smoking Status: current (09/23/2007) Packs/Day: 11/2 PPD (03/02/2007)  Past Medical History: COPD Coronary artery disease s/p 4V CABG 1995, multiple stenting procedures post CABG Hyperlipidemia Renal Artery Stenosis-bilateral stents GERD Cerebrovascular accident, hx of hx of bilat vertebral artery stenosis Peripheral vascular disease-severe lower ext disease s/p 2 failed left fem-pop bypasses, mesenteric bypass, iliac artery stening, bilateral carotid endarterectomy Hypertension Moderate mitral regurgitation EGD 1997 and 2004 - erosive esophagitis, antral ulcer Anxiety Depression glucose intolerance hx of contrast induced nephropathy Anemia-iron deficiency hx of coumadin tx - stopped due to GI blood loss Diverticulosis, colon Colonic polyps, hx of - Dr Leone Payor intestinal angina s/p mesenteric bypass 2010 Anemia-chronic and iron deficient  Review of Systems       all otherwise negative per pt -    Physical Exam  General:  alert and  overweight-appearing.   Head:  normocephalic and atraumatic.   Eyes:  vision grossly intact, pupils equal, and pupils round.   Ears:  R ear normal and L ear normal.   Nose:  no external deformity and no nasal discharge.   Mouth:  no gingival abnormalities and pharynx pink and moist.   Neck:  supple and no masses.   Lungs:  normal respiratory effort and normal breath sounds.   Heart:  normal rate and regular rhythm.   Abdomen:  soft, non-tender, and normal bowel sounds.   Extremities:  no edema, no erythema    Impression & Recommendations:  Problem # 1:  ANEMIA-IRON DEFICIENCY (ICD-280.9)  with known chronic GI blood loss in the past;  will re-check cbc, as well as b12 as I am not sure this has been done recently; o/w will need relatively close f/u given his co-morbidities likely to worsen with worsening anemia. Cont's to decline GI and/or Heme eval for anemia, for now. Orders: TLB-CBC Platelet -  w/Differential (85025-[id number]) TLB-IBC Pnl (Iron/FE;Transferrin) (83550-IBC)  Problem # 2:  GERD (ICD-530.81)  His updated medication list for this problem includes:    Omeprazole 20 Mg Cpdr (Omeprazole) .Marland Kitchen... 1 by mouth once daily to change to PPI from the pepcid  Problem # 3:  HYPERTENSION (ICD-401.9)  His updated medication list for this problem includes:    Lisinopril 20 Mg Tabs (Lisinopril) .Marland Kitchen... 1po qd    Metoprolol Succinate 100 Mg Tb24 (Metoprolol succinate) .Marland Kitchen... 1 by mouth qd  BP today: 122/48 Prior BP: 142/64 (11/10/2009)  Labs Reviewed: K+: 4.3 (01/22/2009) Creat: : 0.8 (01/22/2009)   Chol: 125 (01/22/2009)   HDL: 54.40 (01/22/2009)   LDL: 53 (01/22/2009)   TG: 88.0 (01/22/2009) stable overall by hx and exam, ok to continue meds/tx as is   Complete Medication List: 1)  Cilostazol 100 Mg Tabs (Cilostazol) .Marland Kitchen.. 1po bid 2)  Lipitor 20 Mg Tabs (Atorvastatin calcium) .Marland Kitchen.. 1po once daily 3)  Isosorbide Mononitrate Cr 60 Mg Tb24 (Isosorbide mononitrate) .Marland Kitchen.. 1po qd 4)   Lisinopril 20 Mg Tabs (Lisinopril) .Marland Kitchen.. 1po qd 5)  Metoprolol Succinate 100 Mg Tb24 (Metoprolol succinate) .Marland Kitchen.. 1 by mouth qd 6)  Plavix 75 Mg Tabs (Clopidogrel bisulfate) .Marland Kitchen.. 1 tab once daily 7)  Nitroglycerin 0.4 Mg Subl (Nitroglycerin) .Marland Kitchen.. 1 tab sl as needed 8)  Proair Hfa 108 (90 Base) Mcg/act Aers (Albuterol sulfate) .... 2 puffs four times per day as needed 9)  Aspir-low 81 Mg Tbec (Aspirin) .Marland Kitchen.. 1 by mouth once daily 10)  Omeprazole 20 Mg Cpdr (Omeprazole) .Marland Kitchen.. 1 by mouth once daily 11)  Cyanocobalamin 1000 Mcg/ml Soln (Cyanocobalamin) .Marland Kitchen.. 1 cc im 1 month  Other Orders: TLB-B12 + Folate Pnl ([id number]/[id number])  Patient Instructions: 1)  stop the pepcid 2)  start the omeprazole 20 mg per day 3)  Continue all other previous medications as before this visit  4)  Please go to the Lab in the basement for your blood and/or urine tests today 5)  Please schedule a follow-up appointment in 1 month 6)  please see cardiology as planned Prescriptions: OMEPRAZOLE 20 MG CPDR (OMEPRAZOLE) 1 by mouth once daily  #90 x 3   Entered and Authorized by:   Corwin Levins MD   Signed by:   Corwin Levins MD on 01/03/2010   Method used:   [facility] to        [facility] [address] [phone]* (retail)       [address]       [place], [place]  [address]       Ph: [phone] or [phone]       Fax: [phone]   RxID:   [id number] OMEPRAZOLE 20 MG CPDR (OMEPRAZOLE) 1 by mouth once daily  #90 x 3   Entered and Authorized by:   Corwin Levins MD   Signed by:   Corwin Levins MD on 01/03/2010   Method used:   Print then Give to Patient   RxID:   [id number]

## 2010-09-13 NOTE — Assessment & Plan Note (Signed)
Summary: B-12 / [name]  Nurse Visit   Allergies: No Known Drug Allergies  Medication Administration  Injection # 1:    Medication: Vit B12 1000 mcg    Diagnosis: VITAMIN B12 DEFICIENCY (ICD-266.2)    Route: IM    Site: L deltoid    Exp Date: 06/15/2011    Lot #: [id number]    Mfr: [employer] Regent    Patient tolerated injection without complications    Given by: Margaret Pyle, CMA (Jan 06, 2010 2:07 PM)  Orders Added: 1)  Vit B12 1000 mcg [J3420] 2)  Admin of Therapeutic Inj  intramuscular or subcutaneous [96045]

## 2010-09-13 NOTE — Assessment & Plan Note (Signed)
Summary: b12 shot/john/cd  Nurse Visit   Allergies: No Known Drug Allergies  Medication Administration  Injection # 1:    Medication: Vit B12 1000 mcg    Diagnosis: VITAMIN B12 DEFICIENCY (ICD-266.2)    Route: IM    Site: L deltoid    Exp Date: 02/12/2012    Lot #: [id number]    Mfr: American Regent    Patient tolerated injection without complications    Given by: Margaret Pyle, CMA (May 06, 2010 10:17 AM)  Orders Added: 1)  Admin of Therapeutic Inj  intramuscular or subcutaneous [96372] 2)  Vit B12 1000 mcg [J3420]

## 2010-09-13 NOTE — Letter (Signed)
Summary: Patient Notice- Polyp Results  [facility]  [address] [place], [place] [address]   Phone: [phone]  Fax: [phone]        March 08, 2010 MRN: [id number]    [facility] [address] [place], [place]  [address]    Dear Mr. Douthit,  The polyps removed from your colon were adenomatous. This means that they were pre-cancerous or that  they had the potential to change into cancer over time.  I recommend that you have a repeat colonoscopy in 5 years to determine if you have developed any new polyps over time. we will consider your overall health status at that time, and could decide not to perform a routine colonoscopy then.  The stomach biopsies showed inflammation without signs of infection or cancer and need no specific follow-up.  If you develop any new rectal bleeding, abdominal pain or significant bowel habit changes, please contact us before then.       Keep follow-up with Dr. Jonny Ruiz regarding your anemia.  Please call us if you are having persistent problems or have questions about your condition that have not been fully answered at this time.    Sincerely,  Iva Boop MD, [facility]  This letter has been electronically signed by your physician.  Appended Document: Patient Notice- Polyp Results letter mailed

## 2010-09-14 DIAGNOSIS — K279 Peptic ulcer, site unspecified, unspecified as acute or chronic, without hemorrhage or perforation: Secondary | ICD-10-CM

## 2010-09-14 DIAGNOSIS — Q2730 Arteriovenous malformation, site unspecified: Secondary | ICD-10-CM

## 2010-09-14 HISTORY — DX: Arteriovenous malformation, site unspecified: Q27.30

## 2010-09-14 HISTORY — DX: Peptic ulcer, site unspecified, unspecified as acute or chronic, without hemorrhage or perforation: K27.9

## 2010-09-15 NOTE — Assessment & Plan Note (Signed)
Summary: COUGH   STC   Vital Signs:  Patient profile:   76 year old male Height:      72 inches Weight:      163 pounds BMI:     22.19 O2 Sat:      97 % on Room air Temp:     97.6 degrees F oral Pulse rate:   73 / minute BP sitting:   130 / 58  (left arm) Cuff size:   regular  Vitals Entered By: Zella Ball Ewing CMA Duncan Dull) (August 23, 2010 4:27 PM)  O2 Flow:  Room air CC: Cough/RE   Primary Care Provider:  [name], MD  CC:  Cough/RE.  History of Present Illness: here with acute onset 3 days midl to mod fever, heaadache, ST, and worsening prod cough, greenish sputum, with mild sob, but Pt denies CP, doe, wheezing, orthopnea, pnd, worsening LE edema, palps, dizziness or syncope .  Pt denies new neuro symptoms such as headache, facial or extremity weakness  Pt denies polydipsia, polyuria,  Overall good compliance with meds, trying to follow low chol diet, wt stable, little excercise however .  Also wtih right hearing loss rather sudden tody as well, but no popping, earache, dizziness or significant sinus symtpoms.  No recent wt loss, night sweats, loss of appetite or other constitutional symptoms  Overall good compliance with meds, and good tolerability.   Problems Prior to Update: 1)  Bronchitis-acute  (ICD-466.0) 2)  Wrist Pain, Right  (ICD-719.43) 3)  Tobacco Abuse  (ICD-305.1) 4)  Chest Pain  (ICD-786.50) 5)  Hepatotoxicity, Drug-induced, Risk of  (ICD-V58.69) 6)  Vitamin B12 Deficiency  (ICD-266.2) 7)  Anemia, Iron Deficiency  (ICD-280.9) 8)  Chronic Obstructive Pulmonary Disease, Acute Exacerbation  (ICD-491.21) 9)  Urinary Retention  (ICD-788.20) 10)  Fatigue  (ICD-780.79) 11)  Hypokalemia  (ICD-276.8) 12)  Mitral Regurgitation  (ICD-396.3) 13)  Disease, Peripheral Vascular Nec  (ICD-443.89) 14)  Peripheral Vascular Disease  (ICD-443.9) 15)  Mi  (ICD-410.90) 16)  Coronary Artery Disease  (ICD-414.00) 17)  COPD  (ICD-496) 18)  Hypercholesterolemia  (ICD-272.0) 19)   Hypertension  (ICD-401.9) 20)  Mesenteric Vascular Insufficiency  (ICD-557.1) 21)  Cerebrovascular Accident, Hx of  (ICD-V12.50) 22)  Renal Artery Stenosis  (ICD-440.1) 23)  Hiatal Hernia  (ICD-553.3) 24)  Pud  (ICD-533.90) 25)  Hypokalemia  (ICD-276.8) 26)  Tubulovillous Adenoma, Colon, Hx of  (ICD-V12.72) 27)  Diverticulosis, Colon  (ICD-562.10) 28)  Anemia-iron Deficiency  (ICD-280.9) 29)  Glucose Intolerance  (ICD-271.3) 30)  Depression  (ICD-311) 31)  Anxiety  (ICD-300.00) 32)  Gerd  (ICD-530.81) 33)  Hemorrhoids  (ICD-455.6)  Medications Prior to Update: 1)  Cilostazol 100 Mg Tabs (Cilostazol) .Marland Kitchen.. 1po Bid 2)  Lipitor 20 Mg Tabs (Atorvastatin Calcium) .Marland Kitchen.. 1po Once Daily 3)  Isosorbide Mononitrate Cr 60 Mg Tb24 (Isosorbide Mononitrate) .Marland Kitchen.. 1po Qd 4)  Lisinopril 20 Mg Tabs (Lisinopril) .Marland Kitchen.. 1po Qd 5)  Metoprolol Succinate 100 Mg Tb24 (Metoprolol Succinate) .Marland Kitchen.. 1 By Mouth Qd 6)  Plavix 75 Mg Tabs (Clopidogrel Bisulfate) .Marland Kitchen.. 1 Tab Once Daily 7)  Nitroglycerin 0.4 Mg Subl (Nitroglycerin) .Marland Kitchen.. 1 Tab Sl As Needed 8)  Proair Hfa 108 (90 Base) Mcg/act Aers (Albuterol Sulfate) .... 2 Puffs Four Times Per Day As Needed 9)  Aspir-Low 81 Mg Tbec (Aspirin) .Marland Kitchen.. 1 By Mouth Once Daily 10)  Omeprazole 20 Mg Cpdr (Omeprazole) .Marland Kitchen.. 1 By Mouth Once Daily 11)  Cyanocobalamin 1000 Mcg/ml Soln (Cyanocobalamin) .Marland Kitchen.. 1 Cc Im 1 Month 12)  Ferrous Sulfate 325 (65 Fe) Mg Tabs (Ferrous Sulfate) .Marland Kitchen.. 1po Once Daily 13)  Tramadol Hcl 50 Mg Tabs (Tramadol Hcl) .Marland Kitchen.. 1 By Mouth Q 6 Hrs As Needed 14)  Prednisone 10 Mg Tabs (Prednisone) .... 3po Qd For 3days, Then 2po Qd For 3days, Then 1po Qd For 3days, Then Stop  Current Medications (verified): 1)  Cilostazol 100 Mg Tabs (Cilostazol) .Marland Kitchen.. 1po Bid 2)  Lipitor 20 Mg Tabs (Atorvastatin Calcium) .Marland Kitchen.. 1po Once Daily 3)  Isosorbide Mononitrate Cr 60 Mg Tb24 (Isosorbide Mononitrate) .Marland Kitchen.. 1po Qd 4)  Lisinopril 20 Mg Tabs (Lisinopril) .Marland Kitchen.. 1po Qd 5)  Metoprolol  Succinate 100 Mg Tb24 (Metoprolol Succinate) .Marland Kitchen.. 1 By Mouth Qd 6)  Plavix 75 Mg Tabs (Clopidogrel Bisulfate) .Marland Kitchen.. 1 Tab Once Daily 7)  Nitroglycerin 0.4 Mg Subl (Nitroglycerin) .Marland Kitchen.. 1 Tab Sl As Needed 8)  Proair Hfa 108 (90 Base) Mcg/act Aers (Albuterol Sulfate) .... 2 Puffs Four Times Per Day As Needed 9)  Aspir-Low 81 Mg Tbec (Aspirin) .Marland Kitchen.. 1 By Mouth Once Daily 10)  Omeprazole 20 Mg Cpdr (Omeprazole) .Marland Kitchen.. 1 By Mouth Once Daily 11)  Cyanocobalamin 1000 Mcg/ml Soln (Cyanocobalamin) .Marland Kitchen.. 1 Cc Im 1 Month 12)  Ferrous Sulfate 325 (65 Fe) Mg Tabs (Ferrous Sulfate) .Marland Kitchen.. 1po Once Daily 13)  Tramadol Hcl 50 Mg Tabs (Tramadol Hcl) .Marland Kitchen.. 1 By Mouth Q 6 Hrs As Needed 14)  Prednisone 10 Mg Tabs (Prednisone) .... 3po Qd For 3days, Then 2po Qd For 3days, Then 1po Qd For 3days, Then Stop 15)  Levofloxacin 250 Mg Tabs (Levofloxacin) .Marland Kitchen.. 1po Once Daily 16)  Tussionex Pennkinetic Er 10-8 Mg/78ml Lqcr (Hydrocod Polst-Chlorphen Polst) .Marland Kitchen.. 1 Tsp By Mouth Two Times A Day As Needed  Allergies (verified): No Known Drug Allergies  Past History:  Past Medical History: Last updated: 04/20/2010 COPD Coronary artery disease s/p 4V CABG 1995, multiple stenting procedures post CABG Hyperlipidemia Renal Artery Stenosis-bilateral stents GERD Cerebrovascular accident, hx of hx of bilat vertebral artery stenosis Peripheral vascular disease-severe lower ext disease s/p 2 failed left fem-pop bypasses, mesenteric bypass, iliac artery stenting, bilateral carotid endarterectomy, right common femoral artery endarterectomy with right fem pop bypass February 2011 per Dr. Edilia Bo Hypertension Moderate mitral regurgitation EGD 1997 and 2004 - erosive esophagitis, antral ulcer Anxiety Depression glucose intolerance hx of contrast induced nephropathy Anemia-iron deficiency hx of coumadin tx - stopped due to GI blood loss Diverticulosis, colon Colonic polyps, hx of - Dr Leone Payor intestinal angina s/p mesenteric bypass  2010 Anemia-chronic and iron deficient legal blindness  - retinal disease - does not drive  Past Surgical History: Last updated: 11/10/2009 Coronary artery bypass graft- 1995 4V Renal Artery Stenting- 2004 - bilat s/p multiple LE vascular bypass (includine fem-pop x 2 LLE) s/p bilateral CEA s/p PTCA to the SVG to DIAG, SVG to PDA and SVG to OM Appendectomy s/p lumbar disc surgury x 2 s/p RLE fem bypass feb 2011   Social History: Last updated: 02/07/2010 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  , mild ill  Head:  normocephalic and atraumatic.   Eyes:  vision grossly intact, pupils equal, and pupils round.   Ears:  R ear normal and L ear normal.  after right wax impaction irrigated Nose:  no external deformity and no nasal discharge.   Mouth:  pharyngeal erythema and fair dentition.   Neck:  supple and no masses.   Lungs:  normal respiratory effort, R decreased breath sounds, and L decreased breath sounds.   Heart:  normal rate and regular rhythm.   Extremities:  no edema, no erythema    Impression & Recommendations:  Problem # 1:  BRONCHITIS-ACUTE (ICD-466.0)  His updated medication list for this problem includes:    Proair Hfa 108 (90 Base) Mcg/act Aers (Albuterol sulfate) .Marland Kitchen... 2 puffs four times per day as needed    Levofloxacin 250 Mg Tabs (Levofloxacin) .Marland Kitchen... 1po once daily    Tussionex Pennkinetic Er 10-8 Mg/90ml Lqcr (Hydrocod polst-chlorphen polst) .Marland Kitchen... 1 tsp by mouth two times a day as needed  Orders: T-2 View CXR, Same Day (71020.5TC) treat as above, f/u any worsening signs or symptoms , cant r/o pna - for  cxr  as well   Problem # 2:  OTHER SPECIFIED FORMS OF HEARING LOSS (ICD-389.8) improved right hearing loss after irrigation wax impaction  Problem # 3:  COPD (ICD-496)  His updated medication list for this problem includes:    Proair Hfa 108 (90 Base) Mcg/act Aers (Albuterol sulfate) .Marland Kitchen... 2 puffs four times per day as needed stable overall by hx and exam, ok to continue meds/tx as is   Problem # 4:  HYPERTENSION (ICD-401.9)  His updated medication list for this problem includes:    Lisinopril 20 Mg Tabs (Lisinopril) .Marland Kitchen... 1po qd    Metoprolol Succinate 100 Mg Tb24 (Metoprolol succinate) .Marland Kitchen... 1 by mouth qd  BP today: 130/58 Prior BP: 142/60 (06/06/2010)  Labs Reviewed: K+: 4.6 (01/31/2010) Creat: : 1.3 (01/31/2010)   Chol: 135 (01/31/2010)   HDL: 47.60 (01/31/2010)   LDL: 73 (01/31/2010)   TG: 73.0 (01/31/2010) stable overall by hx and exam, ok to continue meds/tx as is   Complete Medication List: 1)  Cilostazol 100 Mg Tabs (Cilostazol) .Marland Kitchen.. 1po bid 2)  Lipitor 20 Mg Tabs (Atorvastatin calcium) .Marland Kitchen.. 1po once daily 3)  Isosorbide Mononitrate Cr 60 Mg Tb24 (Isosorbide mononitrate) .Marland Kitchen.. 1po qd 4)  Lisinopril 20 Mg Tabs (Lisinopril) .Marland Kitchen.. 1po qd 5)  Metoprolol Succinate 100 Mg Tb24 (Metoprolol succinate) .Marland Kitchen.. 1 by mouth qd 6)  Plavix 75 Mg Tabs (Clopidogrel bisulfate) .Marland Kitchen.. 1 tab once daily 7)  Nitroglycerin 0.4 Mg Subl (Nitroglycerin) .Marland Kitchen.. 1 tab sl as needed 8)  Proair Hfa 108 (90 Base) Mcg/act Aers (Albuterol sulfate) .... 2 puffs four times per day as needed 9)  Aspir-low 81 Mg Tbec (Aspirin) .Marland Kitchen.. 1 by mouth once daily 10)  Omeprazole 20 Mg Cpdr (Omeprazole) .Marland Kitchen.. 1 by mouth once daily 11)  Cyanocobalamin 1000 Mcg/ml Soln (Cyanocobalamin) .Marland Kitchen.. 1 cc im 1 month 12)  Ferrous Sulfate 325 (65 Fe) Mg Tabs (Ferrous sulfate) .Marland Kitchen.. 1po once daily 13)  Tramadol Hcl 50 Mg Tabs (Tramadol hcl) .Marland Kitchen.. 1 by mouth q 6 hrs as needed 14)  Prednisone 10 Mg Tabs (Prednisone) .... 3po qd for 3days, then  2po qd for 3days, then 1po qd for 3days, then stop 15)  Levofloxacin 250 Mg Tabs (Levofloxacin) .Marland Kitchen.. 1po once daily 16)  Tussionex Pennkinetic Er 10-8 Mg/77ml Lqcr (Hydrocod polst-chlorphen polst) .Marland Kitchen.. 1 tsp by mouth two times a day as needed  Patient Instructions: 1)  Please take all new medications as prescribed 2)  Continue all previous medications as  before this visit  3)  Your right ear was irrigated today 4)  Please go to Radiology in the basement level for your X-Ray today  5)  Please call the number on the [facility] Card for results of your testing  6)  Please schedule a follow-up appointment as needed. Prescriptions: Sandria Senter ER 10-8 MG/5ML LQCR (HYDROCOD POLST-CHLORPHEN POLST) 1 tsp by mouth two times a day as needed  #6oz x 1   Entered and Authorized by:   Corwin Levins MD   Signed by:   Corwin Levins MD on 08/23/2010   Method used:   Print then Give to Patient   RxID:   [id number] LEVOFLOXACIN 250 MG TABS (LEVOFLOXACIN) 1po once daily  #10 x 0   Entered and Authorized by:   Corwin Levins MD   Signed by:   Corwin Levins MD on 08/23/2010   Method used:   [facility] to        [facility] [address] [phone]* (retail)       [address]       [place], [place]  [address]       Ph: [phone] or [phone]       Fax: [phone]   RxID:   [id number]    Orders Added: 1)  T-2 View CXR, Same Day [71020.5TC] 2)  Est. Patient Level IV [87564]

## 2010-09-15 NOTE — Assessment & Plan Note (Signed)
Summary: PER PT 1 MTH B12--JWJ--STC  Nurse Visit   Allergies: No Known Drug Allergies  Medication Administration  Injection # 1:    Medication: Vit B12 1000 mcg    Diagnosis: VITAMIN B12 DEFICIENCY (ICD-266.2)    Route: IM    Site: R deltoid    Exp Date: 02/12/2012    Lot #: [id number]    Mfr: American Regent    Patient tolerated injection without complications    Given by: Margaret Pyle, CMA (August 12, 2010 8:38 AM)  Orders Added: 1)  Admin of Therapeutic Inj  intramuscular or subcutaneous [96372] 2)  Vit B12 1000 mcg [J3420]

## 2010-09-21 NOTE — Assessment & Plan Note (Signed)
Summary: PER PT 1 MTH B12   JWJ  STC  Nurse Visit   Allergies: No Known Drug Allergies  Medication Administration  Injection # 1:    Medication: Vit B12 1000 mcg    Diagnosis: VITAMIN B12 DEFICIENCY (ICD-266.2)    Route: IM    Site: L deltoid    Exp Date: 06/14/2012    Lot #: [id number]    Mfr: American Regent    Patient tolerated injection without complications    Given by: Margaret Pyle, CMA (September 12, 2010 10:41 AM)  Orders Added: 1)  Admin of Therapeutic Inj  intramuscular or subcutaneous [96372] 2)  Vit B12 1000 mcg [J3420]

## 2010-09-22 ENCOUNTER — Telehealth: Payer: Self-pay | Admitting: Cardiovascular Disease

## 2010-09-22 ENCOUNTER — Encounter (INDEPENDENT_AMBULATORY_CARE_PROVIDER_SITE_OTHER): Payer: Medicare Other

## 2010-09-22 ENCOUNTER — Encounter: Payer: Self-pay | Admitting: Cardiovascular Disease

## 2010-09-22 DIAGNOSIS — R079 Chest pain, unspecified: Secondary | ICD-10-CM

## 2010-09-22 DIAGNOSIS — I10 Essential (primary) hypertension: Secondary | ICD-10-CM

## 2010-09-23 ENCOUNTER — Encounter: Payer: Self-pay | Admitting: Cardiovascular Disease

## 2010-09-23 ENCOUNTER — Ambulatory Visit (INDEPENDENT_AMBULATORY_CARE_PROVIDER_SITE_OTHER): Payer: Medicare Other | Admitting: Cardiovascular Disease

## 2010-09-23 DIAGNOSIS — R079 Chest pain, unspecified: Secondary | ICD-10-CM

## 2010-09-23 DIAGNOSIS — F17200 Nicotine dependence, unspecified, uncomplicated: Secondary | ICD-10-CM

## 2010-09-23 DIAGNOSIS — I2510 Atherosclerotic heart disease of native coronary artery without angina pectoris: Secondary | ICD-10-CM

## 2010-09-23 DIAGNOSIS — I739 Peripheral vascular disease, unspecified: Secondary | ICD-10-CM

## 2010-09-23 DIAGNOSIS — I10 Essential (primary) hypertension: Secondary | ICD-10-CM

## 2010-09-26 LAB — CONVERTED CEMR LAB
BUN: 11 mg/dL (ref 6–23)
Basophils Absolute: 0 10*3/uL (ref 0.0–0.1)
Basophils Relative: 0 % (ref 0–1)
CO2: 25 meq/L (ref 19–32)
Calcium: 9.4 mg/dL (ref 8.4–10.5)
Creatinine, Ser: 1.09 mg/dL (ref 0.40–1.50)
Eosinophils Absolute: 0.3 10*3/uL (ref 0.0–0.7)
Glucose, Bld: 122 mg/dL — ABNORMAL HIGH (ref 70–99)
Hemoglobin: 9.2 g/dL — ABNORMAL LOW (ref 13.0–17.0)
MCHC: 31.6 g/dL (ref 30.0–36.0)
MCV: 91.2 fL (ref 78.0–100.0)
Monocytes Absolute: 0.8 10*3/uL (ref 0.1–1.0)
Monocytes Relative: 11 % (ref 3–12)
RBC: 3.19 M/uL — ABNORMAL LOW (ref 4.22–5.81)
RDW: 14.6 % (ref 11.5–15.5)

## 2010-09-27 ENCOUNTER — Inpatient Hospital Stay (HOSPITAL_COMMUNITY)
Admission: RE | Admit: 2010-09-27 | Discharge: 2010-10-01 | DRG: 247 | Disposition: A | Discharge status: Home / Self Care | Payer: Medicare Other | Source: Ambulatory Visit | Attending: Cardiovascular Disease | Admitting: Cardiovascular Disease

## 2010-09-27 DIAGNOSIS — I25810 Atherosclerosis of coronary artery bypass graft(s) without angina pectoris: Secondary | ICD-10-CM

## 2010-09-27 DIAGNOSIS — K208 Other esophagitis without bleeding: Secondary | ICD-10-CM | POA: Diagnosis present

## 2010-09-27 DIAGNOSIS — Z7982 Long term (current) use of aspirin: Secondary | ICD-10-CM

## 2010-09-27 DIAGNOSIS — J4489 Other specified chronic obstructive pulmonary disease: Secondary | ICD-10-CM | POA: Diagnosis present

## 2010-09-27 DIAGNOSIS — J449 Chronic obstructive pulmonary disease, unspecified: Secondary | ICD-10-CM | POA: Diagnosis present

## 2010-09-27 DIAGNOSIS — Z7902 Long term (current) use of antithrombotics/antiplatelets: Secondary | ICD-10-CM

## 2010-09-27 DIAGNOSIS — E785 Hyperlipidemia, unspecified: Secondary | ICD-10-CM | POA: Diagnosis present

## 2010-09-27 DIAGNOSIS — Z951 Presence of aortocoronary bypass graft: Secondary | ICD-10-CM

## 2010-09-27 DIAGNOSIS — I059 Rheumatic mitral valve disease, unspecified: Secondary | ICD-10-CM | POA: Diagnosis present

## 2010-09-27 DIAGNOSIS — K31819 Angiodysplasia of stomach and duodenum without bleeding: Secondary | ICD-10-CM | POA: Diagnosis present

## 2010-09-27 DIAGNOSIS — I4892 Unspecified atrial flutter: Secondary | ICD-10-CM | POA: Diagnosis present

## 2010-09-27 DIAGNOSIS — H543 Unqualified visual loss, both eyes: Secondary | ICD-10-CM | POA: Diagnosis present

## 2010-09-27 DIAGNOSIS — I739 Peripheral vascular disease, unspecified: Secondary | ICD-10-CM | POA: Diagnosis present

## 2010-09-27 DIAGNOSIS — D62 Acute posthemorrhagic anemia: Secondary | ICD-10-CM | POA: Diagnosis present

## 2010-09-27 DIAGNOSIS — I959 Hypotension, unspecified: Secondary | ICD-10-CM | POA: Diagnosis not present

## 2010-09-27 DIAGNOSIS — F17200 Nicotine dependence, unspecified, uncomplicated: Secondary | ICD-10-CM | POA: Diagnosis present

## 2010-09-27 DIAGNOSIS — I2510 Atherosclerotic heart disease of native coronary artery without angina pectoris: Principal | ICD-10-CM | POA: Diagnosis present

## 2010-09-28 ENCOUNTER — Encounter: Payer: Self-pay | Admitting: Internal Medicine

## 2010-09-28 DIAGNOSIS — K31819 Angiodysplasia of stomach and duodenum without bleeding: Secondary | ICD-10-CM

## 2010-09-28 DIAGNOSIS — I2510 Atherosclerotic heart disease of native coronary artery without angina pectoris: Secondary | ICD-10-CM

## 2010-09-28 DIAGNOSIS — K259 Gastric ulcer, unspecified as acute or chronic, without hemorrhage or perforation: Secondary | ICD-10-CM

## 2010-09-28 DIAGNOSIS — D62 Acute posthemorrhagic anemia: Secondary | ICD-10-CM

## 2010-09-28 DIAGNOSIS — K921 Melena: Secondary | ICD-10-CM

## 2010-09-28 LAB — BASIC METABOLIC PANEL
BUN: 39 mg/dL — ABNORMAL HIGH (ref 6–23)
Chloride: 107 mEq/L (ref 96–112)
Potassium: 4.5 mEq/L (ref 3.5–5.1)

## 2010-09-28 LAB — CBC
HCT: 21.4 % — ABNORMAL LOW (ref 39.0–52.0)
RDW: 14.7 % (ref 11.5–15.5)
WBC: 9.1 10*3/uL (ref 4.0–10.5)

## 2010-09-28 LAB — HEMOCCULT GUIAC POC 1CARD (OFFICE): Fecal Occult Bld: POSITIVE

## 2010-09-28 LAB — MRSA PCR SCREENING

## 2010-09-28 LAB — PREPARE RBC (CROSSMATCH)

## 2010-09-29 DIAGNOSIS — K253 Acute gastric ulcer without hemorrhage or perforation: Secondary | ICD-10-CM

## 2010-09-29 DIAGNOSIS — K31811 Angiodysplasia of stomach and duodenum with bleeding: Secondary | ICD-10-CM

## 2010-09-29 DIAGNOSIS — K921 Melena: Secondary | ICD-10-CM

## 2010-09-29 DIAGNOSIS — D62 Acute posthemorrhagic anemia: Secondary | ICD-10-CM

## 2010-09-29 LAB — BASIC METABOLIC PANEL
BUN: 28 mg/dL — ABNORMAL HIGH (ref 6–23)
Chloride: 111 mEq/L (ref 96–112)
Creatinine, Ser: 0.98 mg/dL (ref 0.4–1.5)
Glucose, Bld: 89 mg/dL (ref 70–99)

## 2010-09-29 LAB — CBC
HCT: 25.3 % — ABNORMAL LOW (ref 39.0–52.0)
MCH: 28.9 pg (ref 26.0–34.0)
MCHC: 32.8 g/dL (ref 30.0–36.0)
MCV: 88.2 fL (ref 78.0–100.0)
MCV: 88.7 fL (ref 78.0–100.0)
Platelets: 198 10*3/uL (ref 150–400)
Platelets: 199 10*3/uL (ref 150–400)
RBC: 2.87 MIL/uL — ABNORMAL LOW (ref 4.22–5.81)
RDW: 14.7 % (ref 11.5–15.5)
RDW: 14.8 % (ref 11.5–15.5)
WBC: 7.1 10*3/uL (ref 4.0–10.5)

## 2010-09-29 NOTE — Assessment & Plan Note (Addendum)
Summary: ekg/ per Mylo Red  Nurse Visit   Vital Signs:  Patient profile:   76 year old male Height:      72 inches Weight:      158.50 pounds BMI:     21.57 Pulse rate:   97 / minute Pulse rhythm:   regular BP sitting:   148 / 58  (left arm) Cuff size:   large  Vitals Entered By: Lisabeth Devoid RN (September 22, 2010 11:40 AM) CC: chest pain at night Is Patient Diabetic? No Comments Pt came in for bp and ekg due to having non radiating chest pain at night x3 days relieved by 1 ntg.  Chest pain occured after lying down to sleep. It has not occured during the day.  It is substernal nonradiating. No sob noted with cp.  EKG reviewed by DOD Dr. Johney Frame.   He continues to smoke. Encouraged pt not to smoke.  Reinstructed about ntg use and call 911 for unrelieved or increasing cp.  appt made for pt with Dr. Clifton James 09/23/10 at 2:15pm. Pt and wife are aware of appt.   Preventive Screening-Counseling & Management  Alcohol-Tobacco     Smoking Status: current  Comments: Discussed smoking cessation  Current Medications (verified): 1)  Cilostazol 100 Mg Tabs (Cilostazol) .Marland Kitchen.. 1po Bid 2)  Lipitor 20 Mg Tabs (Atorvastatin Calcium) .Marland Kitchen.. 1po Once Daily 3)  Isosorbide Mononitrate Cr 60 Mg Tb24 (Isosorbide Mononitrate) .Marland Kitchen.. 1po Qd 4)  Lisinopril 20 Mg Tabs (Lisinopril) .Marland Kitchen.. 1po Qd 5)  Metoprolol Succinate 100 Mg Tb24 (Metoprolol Succinate) .Marland Kitchen.. 1 By Mouth Qd 6)  Plavix 75 Mg Tabs (Clopidogrel Bisulfate) .Marland Kitchen.. 1 Tab Once Daily 7)  Nitroglycerin 0.4 Mg Subl (Nitroglycerin) .Marland Kitchen.. 1 Tab Sl As Needed 8)  Aspir-Low 81 Mg Tbec (Aspirin) .Marland Kitchen.. 1 By Mouth Once Daily 9)  Omeprazole 20 Mg Cpdr (Omeprazole) .Marland Kitchen.. 1 By Mouth Once Daily  Allergies (verified): No Known Drug Allergies

## 2010-09-29 NOTE — Progress Notes (Signed)
Summary: pt having chest pain  [name]  Phone Note Call from Patient   Caller: Spouse Reason for Call: Talk to Nurse, Talk to Doctor Summary of Call: pt having chest pain for the last week and last night pt has taken nitro they don't know how many he takes it helps for a while but it comes back  Initial call taken by: Omer Jack,  September 22, 2010 8:26 AM  Follow-up for Phone Call        Pt and wife call b/c pt has been hurting for the past 3 nights when he lies down.  His chest does not hurt when sitting up during the day.  It also does not hurt when he first lies down at night. He says he does have a hiatal hernia and belches a lot.  Elevating the head of bed helps.  He has taken NTG and says it stops it.  I offered pt to come in for EKG and BP check or see PCP if he felt this was his hernia.  His wife feels it may be his heart.  Recommended again to come in for EKG.  Pt is painfree at this time. They will call pcp at this time. Follow-up by: Lisabeth Devoid RN,  September 22, 2010 8:50 AM  Additional Follow-up for Phone Call Additional follow up Details #1::        Pt eife calling back regarding a EKG Judie Grieve  September 22, 2010 9:01 AM     Additional Follow-up for Phone Call Additional follow up Details #2::    Pt will be coming in for EKG nurse room visit today at 11:00am Follow-up by: Lisabeth Devoid RN,  September 22, 2010 9:33 AM  Additional Follow-up for Phone Call Additional follow up Details #3:: Details for Additional Follow-up Action Taken: Appt made with Dr. Clifton James 09/23/10 @ 2:15pm Pt is painfree at this time and has had no sob with cp at night.  Additional Follow-up by: Lisabeth Devoid RN,  September 22, 2010 11:36 AM

## 2010-09-29 NOTE — Letter (Signed)
Summary: Cardiac Catheterization Instructions- Main Lab  [employer], Main Office  [address]   [place], [place] [address]   Phone: [phone]  Fax: [phone]     09/23/2010 MRN: [id number]  [name] [address] [place], [place]  [address]  [place]  Dear [name],   You are scheduled for Cardiac Catheterization on 09/27/10              with Dr. Clifton James.  Please arrive at the [facility] at 8:30      a.m. on the day of your procedure.  1. DIET     _x___ Nothing to eat or drink after midnight except your medications with a sip of water.   2. MAKE SURE YOU TAKE YOUR ASPIRIN.        __x__ YOU MAY TAKE ALL of your remaining medications with a small amount of water.  3. Plan for one night stay - bring personal belongings (i.e. toothpaste, toothbrush, etc.)  4. Bring a current list of your medications and current insurance cards.  5. Must have a responsible person to drive you home.   6. Someone must be with yu for the first 24 hours after you arrive home.  7. Please wear clothes that are easy to get on and off and wear slip-on shoes.  *Special note: Every effort is made to have your procedure done on time.  Occasionally there are emergencies that present themselves at the hospital that may cause delays.  Please be patient if a delay does occur.  If you have any questions after you get home, please call the office at the number listed above.  Roberto Maeola Sarah RN

## 2010-09-29 NOTE — Assessment & Plan Note (Signed)
Summary: pt having cp at night. see nurse room visit 09/22/10/dfg   Visit Type:  rov Referring Provider:  [name], MD Primary Provider:  [name], MD  CC:  chest pain.  History of Present Illness: 76 yo WM with pmh of  CAD s/p 4vCABG 1995 and  stenting of 2 saphenous vein grafts, bilateral CEA, mesenteric artery bypass, 2 failed left fem-pop bypasses, renal artery stenting, iliac artery stenting,  right common femoral artery endarterctomy February 2011, HTN, hyperlipidemia and ongoing tobacco abuse here today for follow up. [name] was admitted to [facility] 4/410 with complaints of chest pain. He ruled out for an MI with serial cardiac enzymes. Cardiac cath on November 16, 2008 showed severe triple vessel disease, patent LIIMA to LAD, patent SVG to Diagonal, patent SVG to OM with serial high grade lesions and SVG to PDA with severe in stent restenosis of the body of the vein graft. PCI was performed on 11/20/08 with placement of 2 drug eluting stents in the body of SVG to OM, cutting balloon angioplasty in the segment of in-stent restenosis in the SVG to PDA. He was admittted to [facility] in February 2011 for right femoral artery endarterectomy and right femoral to below the knee popliteal bypass because of a non-healing wound of his right great toe. Dr. Edilia Bo follows his vascular disease. He was readmitted  in May 2011 to [facility] with chest pain. He ruled out for an MI with serial cardiac enzymes. I discussed a cath at that time but his Hgb was low at 7. He also wished to go home and did not want an inpatient workup for his anemia. Since discharge, he has seen GI and had an upper and lower endoscopy. Nothing was found on either endoscopy. Etiology of anemia presumed to be iron deficiency. Most recent Hbg in June was 11. I last saw him in September 2011.   He is added on to my schedule today with c/o chest pain nightly for the last week. This pain is similar to his previous  angina. He also describes SOB, worsened over the last few weeks. He continues to smoke.   Current Medications (verified): 1)  Cilostazol 100 Mg Tabs (Cilostazol) .Marland Kitchen.. 1po Bid 2)  Lipitor 20 Mg Tabs (Atorvastatin Calcium) .Marland Kitchen.. 1po Once Daily 3)  Isosorbide Mononitrate Cr 60 Mg Tb24 (Isosorbide Mononitrate) .Marland Kitchen.. 1po Qd 4)  Lisinopril 20 Mg Tabs (Lisinopril) .Marland Kitchen.. 1po Qd 5)  Metoprolol Succinate 100 Mg Tb24 (Metoprolol Succinate) .Marland Kitchen.. 1 By Mouth Qd 6)  Plavix 75 Mg Tabs (Clopidogrel Bisulfate) .Marland Kitchen.. 1 Tab Once Daily 7)  Nitroglycerin 0.4 Mg Subl (Nitroglycerin) .Marland Kitchen.. 1 Tab Sl As Needed 8)  Aspir-Low 81 Mg Tbec (Aspirin) .Marland Kitchen.. 1 By Mouth Once Daily 9)  Omeprazole 20 Mg Cpdr (Omeprazole) .Marland Kitchen.. 1 By Mouth Once Daily  Allergies (verified): No Known Drug Allergies  Past History:  Past Medical History: COPD Coronary artery disease s/p 4V CABG 1995, multiple stenting procedures post CABG Hyperlipidemia Renal Artery Stenosis-bilateral stents GERD Cerebrovascular accident, hx of hx of bilat vertebral artery stenosis Peripheral vascular disease-severe lower ext disease s/p 2 failed left fem-pop bypasses, mesenteric bypass, iliac artery stenting, bilateral carotid endarterectomy, right common femoral artery endarterectomy with right fem pop bypass February 2011 per Dr. Edilia Bo Hypertension Moderate mitral regurgitation EGD 1997 and 2004 - erosive esophagitis, antral ulcer Anxiety Depression glucose intolerance hx of contrast induced nephropathy Anemia-iron deficiency Atrial flutter hx of coumadin tx - stopped due to GI blood  loss Diverticulosis, colon Colonic polyps, hx of - Dr Leone Payor intestinal angina s/p mesenteric bypass 2010 Anemia-chronic and iron deficient legal blindness  - retinal disease - does not drive  Past Surgical History: Reviewed history from 11/10/2009 and no changes required. Coronary artery bypass graft- 1995 4V Renal Artery Stenting- 2004 - bilat s/p multiple LE  vascular bypass (includine fem-pop x 2 LLE) s/p bilateral CEA s/p PTCA to the SVG to DIAG, SVG to PDA and SVG to OM Appendectomy s/p lumbar disc surgury x 2 s/p RLE fem bypass feb 2011   Family History: Reviewed history from 09/23/2007 and no changes required. brother with prostate cancer father and sister with heart disease mother with colon cancer at 27 yo sister died with stroke  Social History: Reviewed history from 02/07/2010 and no changes required. 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  The patient denies fatigue, malaise, fever, weight gain/loss, vision loss, decreased hearing, hoarseness, palpitations, prolonged cough, wheezing, sleep apnea, coughing up blood, abdominal pain, blood in stool, nausea, vomiting, diarrhea, heartburn, incontinence, blood in urine, muscle weakness, joint pain, leg swelling, rash, skin lesions, headache, fainting, dizziness, depression, anxiety, enlarged lymph nodes, easy bruising or bleeding, and environmental allergies.    Vital Signs:  Patient profile:   76 year old male Height:      72 inches Weight:      159.25 pounds BMI:     21.68 Pulse rate:   96 / minute BP sitting:   128 / 52  (left arm) Cuff size:   regular  Vitals Entered By: Caralee Ates CMA (September 23, 2010 2:43 PM)  Physical Exam  General:  General: Well developed, well nourished, NAD HEENT: OP clear, mucus membranes moist SKIN: warm, dry Neuro: No focal deficits Musculoskeletal: Muscle strength 5/5 all ext Psychiatric: Mood and affect normal Neck: No JVD, no carotid bruits, no thyromegaly, no lymphadenopathy. Lungs:Clear bilaterally, no wheezes, rhonci, crackles CV: RRR no murmurs, gallops rubs Abdomen: soft, NT, ND, BS  present Extremities: No edema, pulses non-palpable bilateral DP/PT.     EKG  Procedure date:  09/22/2010  Findings:      Sinus rhythm. Rate 97 bpm. PVCs. LAFB.   Impression & Recommendations:  Problem # 1:  CORONARY ARTERY DISEASE (ICD-414.00) Chest pain c/w previous angina in patient with severe CAD s/p CABG. Will plan cath next week. Will check BMET, CBC and coags today. Incease Imdur to 60 mg by mouth two times a day. NTG SL as needed.   His updated medication list for this problem includes:    Cilostazol 100 Mg Tabs (Cilostazol) .Marland Kitchen... 1po bid    Isosorbide Mononitrate Cr 60 Mg Tb24 (Isosorbide mononitrate) .Marland Kitchen... 1po qd    Lisinopril 20 Mg Tabs (Lisinopril) .Marland Kitchen... 1po qd    Metoprolol Succinate 100 Mg Tb24 (Metoprolol succinate) .Marland Kitchen... 1 by mouth qd    Plavix 75 Mg Tabs (Clopidogrel bisulfate) .Marland Kitchen... 1 tab once daily    Nitroglycerin 0.4 Mg Subl (Nitroglycerin) .Marland Kitchen... 1 tab sl as needed    Aspir-low 81 Mg Tbec (Aspirin) .Marland Kitchen... 1 by mouth once daily  Problem # 2:  PERIPHERAL VASCULAR DISEASE (ICD-443.9) Stable.   Problem # 3:  HYPERTENSION (ICD-401.9) Controlled.  No changes.  His updated medication list for  this problem includes:    Lisinopril 20 Mg Tabs (Lisinopril) .Marland Kitchen... 1po qd    Metoprolol Succinate 100 Mg Tb24 (Metoprolol succinate) .Marland Kitchen... 1 by mouth qd    Aspir-low 81 Mg Tbec (Aspirin) .Marland Kitchen... 1 by mouth once daily  His updated medication list for this problem includes:    Lisinopril 20 Mg Tabs (Lisinopril) .Marland Kitchen... 1po qd    Metoprolol Succinate 100 Mg Tb24 (Metoprolol succinate) .Marland Kitchen... 1 by mouth qd    Aspir-low 81 Mg Tbec (Aspirin) .Marland Kitchen... 1 by mouth once daily  Problem # 4:  TOBACCO ABUSE (ICD-305.1) Smoking cessation encouraged.   Other Orders: TLB-BMP (Basic Metabolic Panel-BMET) (80048-METABOL) TLB-CBC Platelet - w/Differential (85025-CBCD) TLB-PT (Protime) (85610-PTP) T-Basic Metabolic Panel (16109-60454) T-CBC w/Diff (09811-91478) T-Protime, Auto  ([id number]) Cardiac Catheterization (Cardiac Cath)  Patient Instructions: 1)  Your physician recommends that you schedule a follow-up appointment in: 2-3 weeks. 2)  Your physician recommends that you continue on your current medications as directed. Please refer to the Current Medication list given to you today. 3)  Your physician has requested that you have a cardiac catheterization 09/27/10 with Dr. Clifton James.  Cardiac catheterization is used to diagnose and/or treat various heart conditions. Doctors may recommend this procedure for a number of different reasons. The most common reason is to evaluate chest pain. Chest pain can be a symptom of coronary artery disease (CAD), and cardiac catheterization can show whether plaque is narrowing or blocking your heart's arteries. This procedure is also used to evaluate the valves, as well as measure the blood flow and oxygen levels in different parts of your heart.  For further information please visit [email or web address]  Please follow instruction sheet, as given.

## 2010-09-29 NOTE — Procedures (Signed)
[name], [name]                   ACCOUNT NO.:  [id number]  MEDICAL RECORD NO.:  [id number]           PATIENT TYPE:  I  LOCATION:  6533                         FACILITY:  [facility]  PHYSICIAN:  [name], MDDATE OF BIRTH:  [birth year]  DATE OF PROCEDURE:  09/27/2010 DATE OF DISCHARGE:                           CARDIAC CATHETERIZATION   PRIMARY CARE PHYSICIAN:  Roberto Levins, MD  PROCEDURES PERFORMED: 1. Left heart catheterization. 2. Selective coronary angiography. 3. Left ventricular angiogram. 4. Saphenous vein graft angiography. 5. Percutaneous transluminal coronary angioplasty with placement of a     drug-eluting stent in the distal body of the saphenous vein graft     to the diagonal branch. 6. Percutaneous transluminal coronary angioplasty with placement of a     drug-eluting stent in the proximal body of the saphenous vein graft     to the posterior descending artery.  OPERATOR:  Roberto Carrow, MD  ARTERIAL ACCESS SITE:  Right femoral artery.  INDICATION:  This is a 76 year old Caucasian male with a history of coronary artery disease status post four-vessel CABG in 1995 who has undergone multiple percutaneous interventions since that time.  Most recently, the patient had overlapping drug-eluting stents placed in the saphenous vein graft to the obtuse marginal branch in April 2010.  He also had a cutting balloon angioplasty of an in-stent restenosis in the proximal body of the saphenous vein graft to the posterior descending artery at that time.  The patient has had bilateral carotid endarterectomies, mesenteric and hepatic artery bypass, renal artery bypass, bilateral iliac artery stenting, and several bilateral lower extremity bypass procedures in addition to his cardiac procedures.  The patient has recently had accelerated chest pain while at night laying in bed.  I saw him in the office and arranged the diagnostic catheterization for  today.  DETAILS OF THE PROCEDURE:  The patient was brought to the main cardiac catheterization laboratory after signing informed consent for the procedure.  The right groin was prepped and draped in a sterile fashion. A 1% lidocaine was used for local anesthesia.  A 5-French sheath was inserted into the right femoral artery without difficulty.  A 23-mm sheath was then advanced into the distal aorta.  A JL-4 catheter was used to selectively engage the totally occluded left main artery. Selective angiography was performed.  A JR-4 catheter was used to selectively engage the native right coronary artery.  This vessel is found to be totally occluded in the midportion.  An LCB catheter was used to selectively engage and inject both saphenous vein graft to the obtuse marginal branch and the diagonal branch.  An RCB catheter was used to selectively engage and inject the saphenous vein graft to the posterior descending artery.  After some difficulty with catheter manipulation, we ultimately decided not to advance a wire into the left subclavian artery secondary to the heavy calcification of this patient's aortic arch.  This patient's left internal mammary artery was found to be patent 2 years ago and had no lesions at all at that time.  Left ventricular angiogram was performed at the conclusion of the  case. After the diagnostic portion of the procedure, we elected to proceed intervention of the severe stenosis in the saphenous vein graft to the diagonal branch.  The sheath was upsized to a 6-French sheath.  The patient was given a bolus of Angiomax and a drip was started.  A 6- French LCB guiding catheter was used to selectively engage the ostium of the saphenous vein graft to the diagonal branch.  A Cougar intracoronary wire was passed down into the body of the saphenous vein graft into the diagonal branch.  A 2.5 x 12-mm balloon was carefully positioned and inflated in the area of tightest  stenosis.  A 3.0 x 12-mm Promus Element drug-eluting stent was carefully positioned in the distal body of the vein graft.  There was a slight overlap with the previously placed stent.  This stent was deployed without difficulty.  The stenosis was taken from 99% down to 0%.  At this point, we turned our attention to the severe stenosis in the stented segment of the proximal body of the saphenous vein graft to the posterior descending artery.  The Cougar wire was then advanced down the body of the saphenous vein graft.  A 2.5 x 12-mm balloon was carefully inflated in the area of tightest stenosis within the previously placed stent.  A 3.0 x 20-mm Promus Element drug- eluting stent was carefully positioned in the proximal body of the vein graft and was inflated.  A 90% stenosis was taken down to 0%.  The patient tolerated the procedure well.  There was excellent flow into both target vessels after the interventions.  The patient was taken to the holding area in stable condition.  HEMODYNAMIC FINDINGS:  Central aortic pressure 155/49.  Left ventricular pressure 151/0.  Left ventricular end-diastolic pressure 13.  ANGIOGRAPHIC FINDINGS: 1. The left main coronary artery is occluded at the ostium.  The     distal left anterior descending artery is known to fill from the     left internal mammary artery graft.  We did not inject the left     internal mammary artery graft today as it has been very difficult     in the past to selectively engage the left subclavian artery     secondary to the severe calcification of the aortic arch.  The     circumflex artery is also occluded distal to the left main artery;     however, the obtuse marginal branch fills in the saphenous vein     graft. 2. The native right coronary artery is diffusely diseased throughout     its proximal segment and it is 100% occluded in the midportion. 3. The saphenous vein graft to the obtuse marginal branch is patent     with  patent overlapping stents in the proximal mid body of the     graft. 4. Saphenous vein graft to the diagonal branch is patent with patent     stents distally, however, just distal to the stented segment is a     99% stenosis. 5. The saphenous vein graft to the posterior descending artery is     patent; however, the stent in the proximal body of the vein graft     has a 90% in-stent restenosis.  This was the lesion that we     performed a cutting balloon angioplasty on 2 years ago. 6. The left internal mammary artery graft was not selectively engaged     or injected secondary to severely calcified aorta  due to difficulty     engaging the left subclavian artery.  I did not feel that the     benefit outweighed the risk of continued catheter manipulations     inside the severely calcified aorta. 7. Left ventricular angiogram was performed in the RAO projection and     showed normal left ventricular systolic function with an ejection     fraction of 65%.  IMPRESSION: 1. Triple-vessel coronary artery disease status post four-vessel     coronary artery bypass graft with patent bypass graft. 2. Successful percutaneous transluminal coronary angioplasty with     placement of a drug-eluting stent in the distal body of the     saphenous vein graft to the diagonal branch and successful     percutaneous transluminal coronary angioplasty with placement of a     drug-eluting stent in the proximal body of the saphenous vein graft     to the posterior descending artery. 3. Normal left ventricular systolic function.  RECOMMENDATIONS:  The patient will be continued on aspirin, Plavix, beta- blocker, statin.  We will encourage tobacco cessation.     Roberto Carrow, MD     CM/MEDQ  D:  09/27/2010  T:  09/28/2010  Job:  [id number]  cc:   Roberto Levins, MD  Electronically Signed by Roberto Carrow MD on 09/29/2010 11:08:41 AM

## 2010-09-30 DIAGNOSIS — I200 Unstable angina: Secondary | ICD-10-CM

## 2010-09-30 LAB — CBC
Hemoglobin: 7.6 g/dL — ABNORMAL LOW (ref 13.0–17.0)
MCH: 29.1 pg (ref 26.0–34.0)
MCHC: 32.8 g/dL (ref 30.0–36.0)
Platelets: 205 10*3/uL (ref 150–400)
Platelets: 220 10*3/uL (ref 150–400)
RBC: 2.94 MIL/uL — ABNORMAL LOW (ref 4.22–5.81)
RDW: 14.7 % (ref 11.5–15.5)
RDW: 14.7 % (ref 11.5–15.5)
WBC: 6.4 10*3/uL (ref 4.0–10.5)

## 2010-10-01 LAB — BASIC METABOLIC PANEL
CO2: 27 mEq/L (ref 19–32)
Calcium: 8.8 mg/dL (ref 8.4–10.5)
GFR calc Af Amer: >60 mL/min (ref >60)
Glucose, Bld: 98 mg/dL (ref 70–99)
Potassium: 3.7 mEq/L (ref 3.5–5.1)
Sodium: 140 mEq/L (ref 135–145)

## 2010-10-01 LAB — CBC
HCT: 26.4 % — ABNORMAL LOW (ref 39.0–52.0)
Hemoglobin: 8.6 g/dL — ABNORMAL LOW (ref 13.0–17.0)
RBC: 2.96 MIL/uL — ABNORMAL LOW (ref 4.22–5.81)
WBC: 6.4 10*3/uL (ref 4.0–10.5)

## 2010-10-02 LAB — CROSSMATCH
ABO/RH(D): B POS
Antibody Screen: POSITIVE
DAT, IgG: NEGATIVE
Unit division: 0
Unit division: 0
Unit division: 0

## 2010-10-04 ENCOUNTER — Encounter: Payer: Self-pay | Admitting: Cardiovascular Disease

## 2010-10-04 ENCOUNTER — Encounter (INDEPENDENT_AMBULATORY_CARE_PROVIDER_SITE_OTHER): Payer: Medicare Other | Admitting: Cardiovascular Disease

## 2010-10-04 ENCOUNTER — Other Ambulatory Visit: Payer: Self-pay | Admitting: Cardiovascular Disease

## 2010-10-04 DIAGNOSIS — F17200 Nicotine dependence, unspecified, uncomplicated: Secondary | ICD-10-CM

## 2010-10-04 DIAGNOSIS — I2510 Atherosclerotic heart disease of native coronary artery without angina pectoris: Secondary | ICD-10-CM

## 2010-10-04 LAB — CBC WITH DIFFERENTIAL/PLATELET
Basophils Relative: 0.5 % (ref 0.0–3.0)
Eosinophils Absolute: 0.4 10*3/uL (ref 0.0–0.7)
Eosinophils Relative: 5.8 % — ABNORMAL HIGH (ref 0.0–5.0)
Hemoglobin: 10.4 g/dL — ABNORMAL LOW (ref 13.0–17.0)
Lymphocytes Relative: 17.8 % (ref 12.0–46.0)
MCHC: 33.7 g/dL (ref 30.0–36.0)
MCV: 91.1 fl (ref 78.0–100.0)
Monocytes Absolute: 0.6 10*3/uL (ref 0.1–1.0)
Neutro Abs: 4.6 10*3/uL (ref 1.4–7.7)
Neutrophils Relative %: 66.9 % (ref 43.0–77.0)
RBC: 3.4 Mil/uL — ABNORMAL LOW (ref 4.22–5.81)
WBC: 6.8 10*3/uL (ref 4.5–10.5)

## 2010-10-05 NOTE — Procedures (Addendum)
Summary: Upper Endoscopy  Patient: [name] Note: All result statuses are Final unless otherwise noted.  Tests: (1) Upper Endoscopy (EGD)   EGD Upper Endoscopy       DONE     [facility]     [address]     [place], [place]  [address]           ENDOSCOPY PROCEDURE REPORT           PATIENT:  [name], [name]  MR#:  [id number]     BIRTHDATE:  [birth year], 75 yrs. old  GENDER:  male           ENDOSCOPIST:  Wilhemina Bonito. Eda Keys, MD     Referred by:  Anselm Lis, M.D.           PROCEDURE DATE:  09/28/2010     PROCEDURE:  EGD for control of bleeding     ASA CLASS:  Class III     INDICATIONS:  melena           MEDICATIONS:   Fentanyl 30 mcg IV, Versed 3 mg     TOPICAL ANESTHETIC:  Exactacain Spray           DESCRIPTION OF PROCEDURE:   After the risks benefits and     alternatives of the procedure were thoroughly explained, informed     consent was obtained.  The EG-[id number] ([id number]) endoscope was     introduced through the mouth and advanced to the third portion of     the duodenum, without limitations.  The instrument was slowly     withdrawn as the mucosa was fully examined.     <<PROCEDUREIMAGES>>           The esophagus and gastroesophageal junction were completely normal     in appearance. Diffusely Atrophic gastric mucosa. Coffe grounds     throughout stomach. Multiple nonbleeding AVMs in the stomach, in     multiple areas (see photos).  A 5mm clean based ulcer was found at     the pylorus.  A small nonbleeding AVM in the second portion of the     duodenum.    Retroflexed views revealed no abnormalities.           THERAPY: ALL VISUALIZED AVMS IN THE STOMACH AND DUODENUM WERE     ABLATED WITH APC. NO BLEEDING PRECIPITATED     The scope was then withdrawn from the patient and the procedure     completed.           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 Eda Keys, MD           CC:  Anselm Lis, MD;Carl Leone Payor, MD;James Ellin Mayhew,     MD;The Patient           n.  eSIGNED:   Wilhemina Bonito. Eda Keys at 09/28/2010 03:35 PM           Andreas Blower, [id number]  Note: An exclamation mark (!) indicates a result that was not dispersed into the flowsheet. Document Creation Date: 09/28/2010 3:36 PM _______________________________________________________________________  (1) Order result status: Final Collection or observation date-time: 09/28/2010 15:22 Requested date-time:  Receipt date-time:  Reported date-time:  Referring Physician:   Ordering Physician: Fransico Setters [phone]) Specimen Source:  Source: Launa Grill Order Number: [id number] Lab site:

## 2010-10-11 NOTE — Assessment & Plan Note (Signed)
Summary: eph/wpa   Visit Type:  eph Referring Provider:  [name], MD Primary Provider:  [name], MD  CC:  pt  has no complaints today.  History of Present Illness: 76 yo WM with pmh of  CAD s/p 4vCABG 1995 and  stenting of 2 saphenous vein grafts, bilateral CEA, mesenteric artery bypass, 2 failed left fem-pop bypasses, renal artery stenting, iliac artery stenting,  right common femoral artery endarterctomy February 2011, HTN, hyperlipidemia and ongoing tobacco abuse here today for follow up. [name] was admitted to [facility] 4/410 with complaints of chest pain. He ruled out for an MI with serial cardiac enzymes. Cardiac cath on November 16, 2008 showed severe triple vessel disease, patent LIIMA to LAD, patent SVG to Diagonal, patent SVG to OM with serial high grade lesions and SVG to PDA with severe in stent restenosis of the body of the vein graft. PCI was performed on 11/20/08 with placement of 2 drug eluting stents in the body of SVG to OM, cutting balloon angioplasty in the segment of in-stent restenosis in the SVG to PDA. He was admittted to [facility] in February 2011 for right femoral artery endarterectomy and right femoral to below the knee popliteal bypass because of a non-healing wound of his right great toe. Dr. Edilia Bo follows his vascular disease. He was readmitted  in May 2011 to [facility] with chest pain. He ruled out for an MI with serial cardiac enzymes. I discussed a cath at that time but his Hgb was low at 7. He also wished to go home and did not want an inpatient workup for his anemia. Since discharge, he has seen GI and had an upper and lower endoscopy. Nothing was found on either endoscopy. Etiology of anemia presumed to be iron deficiency.  I saw him on 09/23/10 and he was c/o chest pain at night. I arranged a left heart cath on 09/27/10. He was found to have severe in-stent restenosis proximal body of SVG to PDA and severe stenosis distal body of SVG to  Diagonal. Drug eluting stents were placed in both areas. Post PCI he had black stools and Hbg dropped to 6.0. He was seen by GI. Upper endocscopy showed AVM stomach and dudoenum as well as pyloric ulcler.  He was transfused and started on a PPI. He has done well since discharge from the hospital. He has had no chest pain. Breathing has been normal. No bowel movements since discharge.   Current Medications (verified): 1)  Cilostazol 100 Mg Tabs (Cilostazol) .Marland Kitchen.. 1po Bid 2)  Lipitor 20 Mg Tabs (Atorvastatin Calcium) .Marland Kitchen.. 1po Once Daily 3)  Isosorbide Mononitrate Cr 60 Mg Tb24 (Isosorbide Mononitrate) .Marland Kitchen.. 1 Tab At Bedtime 4)  Metoprolol Succinate 25 Mg Xr24h-Tab (Metoprolol Succinate) .... 1/2 Tab Two Times A Day 5)  Plavix 75 Mg Tabs (Clopidogrel Bisulfate) .Marland Kitchen.. 1 Tab Once Daily 6)  Nitroglycerin 0.4 Mg Subl (Nitroglycerin) .Marland Kitchen.. 1 Tab Sl As Needed 7)  Aspir-Low 81 Mg Tbec (Aspirin) .Marland Kitchen.. 1 By Mouth Once Daily 8)  Protonix 40 Mg Tbec (Pantoprazole Sodium) .... Twice Daily With Meals 9)  Ferrous Sulfate 325 (65 Fe) Mg Tabs (Ferrous Sulfate) .... Three Times A Day 10)  B-12 1000 Mcg Subl (Cyanocobalamin) .Marland Kitchen.. 1 Injection Once A Month  Allergies (verified): No Known Drug Allergies  Past History:  Past Medical History: COPD Coronary artery disease s/p 4V CABG 1995, multiple stenting procedures post CABG. Most recently DES to SVG to PDA in proximal body  of SVG and DES distal body of SVG to Diagonal.  Hyperlipidemia Renal Artery Stenosis-bilateral stents GERD Cerebrovascular accident, hx of hx of bilat vertebral artery stenosis Peripheral vascular disease-severe lower ext disease s/p 2 failed left fem-pop bypasses, mesenteric bypass, iliac artery stenting, bilateral carotid endarterectomy, right common femoral artery endarterectomy with right fem pop bypass February 2011 per Dr. Edilia Bo Hypertension Moderate mitral regurgitation EGD 1997 and 2004, 2012- AVM gastric/dudoenum, pyloric  ulcer Anxiety Depression glucose intolerance hx of contrast induced nephropathy Anemia-iron deficiency Atrial flutter hx of coumadin tx - stopped due to GI blood loss Diverticulosis, colon Colonic polyps, hx of - Dr Leone Payor intestinal angina s/p mesenteric bypass 2010 Anemia-chronic and iron deficient legal blindness  - retinal disease - does not drive  Social History: Reviewed history from 02/07/2010 and no changes required. 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 Current Smoker-1 ppd Alcohol use-no No illicit drug use 2 children Daily Caffeine Use 1/2 decaf 1/2 caffine coffee a day  Review of Systems  The patient denies fatigue, malaise, fever, weight gain/loss, vision loss, decreased hearing, hoarseness, chest pain, palpitations, shortness of breath, prolonged cough, wheezing, sleep apnea, coughing up blood, abdominal pain, blood in stool, nausea, vomiting, diarrhea, heartburn, incontinence, blood in urine, muscle weakness, joint pain, leg swelling, rash, skin lesions, headache, fainting, dizziness, depression, anxiety, enlarged lymph nodes, easy bruising or bleeding, and environmental allergies.    Vital Signs:  Patient profile:   76 year old male Height:      72 inches Weight:      159.50 pounds BMI:     21.71 Pulse rate:   96 / minute Resp:     18 per minute BP sitting:   142 / 68  (left arm) Cuff size:   regular  Vitals Entered By: Celestia Khat, CMA (October 04, 2010 10:09 AM)  Physical Exam  General:  General: Well developed, well nourished, NAD HEENT: OP clear, mucus membranes moist SKIN: warm, dry Neuro: No focal deficits Musculoskeletal: Muscle strength 5/5 all ext Psychiatric: Mood and affect normal Neck: No JVD, no carotid bruits, no thyromegaly, no lymphadenopathy. Lungs:Clear bilaterally, no wheezes, rhonci, crackles CV: RRR no murmurs, gallops rubs Abdomen: soft, NT, ND, BS present Extremities: No edema, pulses  non-palpable bilateral DP/PT.    EKG  Procedure date:  10/04/2010  Findings:      sinus rhythm, rate 95 bpm. LAFB. Non-specific T wave changes.   Cardiac Cath  Procedure date:  09/27/2010  Findings:      1. The left main coronary artery is occluded at the ostium.  The     distal left anterior descending artery is known to fill from the     left internal mammary artery graft.  We did not inject the left     internal mammary artery graft today as it has been very difficult     in the past to selectively engage the left subclavian artery     secondary to the severe calcification of the aortic arch.  The     circumflex artery is also occluded distal to the left main artery;     however, the obtuse marginal branch fills in the saphenous vein     graft. 2. The native right coronary artery is diffusely diseased throughout     its proximal segment and it is 100% occluded in the midportion. 3. The saphenous vein graft to the obtuse marginal branch is patent     with patent overlapping stents in  the proximal mid body of the     graft. 4. Saphenous vein graft to the diagonal branch is patent with patent     stents distally, however, just distal to the stented segment is a     99% stenosis. 5. The saphenous vein graft to the posterior descending artery is     patent; however, the stent in the proximal body of the vein graft     has a 90% in-stent restenosis.  This was the lesion that we     performed a cutting balloon angioplasty on 2 years ago. 6. The left internal mammary artery graft was not selectively engaged     or injected secondary to severely calcified aorta due to difficulty     engaging the left subclavian artery.  I did not feel that the     benefit outweighed the risk of continued catheter manipulations     inside the severely calcified aorta. 7. Left ventricular angiogram was performed in the RAO projection and     showed normal left ventricular systolic function with an  ejection     fraction of 65%.   PCI: DES SVG to PDA and DES SVG to Diagonal  Impression & Recommendations:  Problem # 1:  CORONARY ARTERY DISEASE (ICD-414.00) Stable post PCI. Continue ASA and Plavix. Will recheck CBC today with recent GI bleeding.   The following medications were removed from the medication list:    Lisinopril 20 Mg Tabs (Lisinopril) .Marland Kitchen... 1po qd His updated medication list for this problem includes:    Cilostazol 100 Mg Tabs (Cilostazol) .Marland Kitchen... 1po bid    Isosorbide Mononitrate Cr 60 Mg Tb24 (Isosorbide mononitrate) .Marland Kitchen... 1 tab at bedtime    Metoprolol Succinate 25 Mg Xr24h-tab (Metoprolol succinate) .Marland Kitchen... 1/2 tab two times a day    Plavix 75 Mg Tabs (Clopidogrel bisulfate) .Marland Kitchen... 1 tab once daily    Nitroglycerin 0.4 Mg Subl (Nitroglycerin) .Marland Kitchen... 1 tab sl as needed    Aspir-low 81 Mg Tbec (Aspirin) .Marland Kitchen... 1 by mouth once daily  Orders: TLB-CBC Platelet - w/Differential (85025-CBCD)  Problem # 2:  PERIPHERAL VASCULAR DISEASE (ICD-443.9) Stable.   Problem # 3:  TOBACCO ABUSE (ICD-305.1) Smoking cessation encouraged.   Patient Instructions: 1)  Your physician recommends that you schedule a follow-up appointment in: 6 months 2)  Your physician recommends that you continue on your current medications as directed. Please refer to the Current Medication list given to you today. Prescriptions: PLAVIX 75 MG TABS (CLOPIDOGREL BISULFATE) 1 tab once daily  #30 Tablet x 8   Entered by:   Whitney Maeola Sarah RN   Authorized by:   Verne Carrow, MD   Signed by:   Ellender Hose RN on 10/04/2010   Method used:   Electronically to        [facility] [address] [phone]* (retail)       [address]       [place], [place]  [address]       Ph: [phone] or [phone]       Fax: [phone]   RxID:   [id number] NITROGLYCERIN 0.4 MG SUBL (NITROGLYCERIN) 1 tab SL as needed  #25 x 3   Entered by:   Whitney Maeola Sarah RN   Authorized by:    Verne Carrow, MD   Signed by:   Ellender Hose RN on 10/04/2010   Method used:   [facility]ronically to  [facility] [address] [phone]* (retail)       [address]       [place], [place]  [address]       Ph: [phone] or [phone]       Fax: [phone]   RxID:   [id number]

## 2010-10-13 NOTE — Discharge Summary (Signed)
[name], [name]                   ACCOUNT NO.:  [id number]  MEDICAL RECORD NO.:  [id number]           PATIENT TYPE:  I  LOCATION:  2927                         FACILITY:  [facility]  PHYSICIAN:  Learta Codding, MD,FACC DATE OF BIRTH:  1934/12/11  DATE OF ADMISSION:  09/27/2010 DATE OF DISCHARGE:  10/01/2010                              DISCHARGE SUMMARY   PRIMARY CARDIOLOGIST:  Verne Carrow, MD  DISCHARGE DIAGNOSES: 1. Coronary artery disease.     a.     Status post Promus drug-eluting stenting of both proximal       saphenous vein graft - PDA (secondary to high grade in-stent restenosis),       and of distal saphenous vein graft - diagonal branch, this admission.     b.     Residual severe native three-vessel coronary artery disease;      patent saphenous vein graft - obtuse marginal branch, with patent      proximal overlapping stents; patent saphenous vein graft - diagonal branch,       with patent distal stents; LIMA graft, not injected.     c.     Normal left ventricular function (EF 65%).     d.     Status post four-vessel coronary artery bypass graft, in      1995; multiple subsequent stenting procedures. 2. Acute upper gastrointestinal bleeding.     a.     Requiring packed red blood cell transfusion.     b.     Multiple gastric/duodenal arteriovenous malformations, treated       with APC ablation.  SECONDARY DIAGNOSES: 1. Hypertension. 2. Dyslipidemia. 3. Renal vascular disease.     a.     Status post bilateral stenting. 4. Peripheral arterial disease.     a.     Status post left femoral-anterior tibial bypass graft, 05/10,      history of occluded left fem-pop bypass graft, and right common femoral      artery endarterectomy with right femoral-popliteal bypass graft, 02/11.     b.     Status post bilateral carotid endarterectomy.     c.     History of bilateral vertebral artery stenosis.     d.     History of supraceliac-hepatic artery, and superior mesenteric         artery bypass. 5. History of stroke. 6. Mitral regurgitation. 7. Erosive esophagitis/antral ulcer. 8. Atrial flutter.     a.     History of Coumadin (discontinued, secondary to      gastrointestinal bleeding). 9. Chronic anemia. 10.History of contrast-induced nephropathy. 11.Glucose intolerance. 12.Legally blind.  REASON FOR ADMISSION:  The patient is a 76 year old male, with complex medical history, including ischemic heart disease, as outlined above, who was recently seen in the office by Dr. Clifton James, for complaint of chest pain.  Recommendation was to proceed with elective diagnostic cardiac catheterization.  HOSPITAL COURSE:  The patient underwent elective coronary angiography, by Dr. Clifton James (see report for full details), with coronary artery disease as outlined above, and subsequent percutaneous intervention with Promus drug-eluting stenting.  There  were no noted complications.  Postoperative course was complicated by development of severe anemia, with a hemoglobin level of 6.7.  The patient did received packed RBC transfusion, with improvement in hemoglobin level from the initial low of 6.7 to a level of 8.6, at the time of discharge.  Guaiac stools were all positive, and the patient was referred to the [facility] in consultation.  They proceeded to evaluate with an upper endoscopy, performed by Dr. Yancey Flemings, notable for evidence of an AVM in the second portion of the duodenum, coffee-ground emesis throughout the stomach, and multiple nonbleeding AVMs in the stomach.  There was a 5-mm clean ulcer at the base of the pylorus.  Recommendations were to continue PPI, monitor for repeat bleeding, and chronic supplemental iron therapy.  The patient was monitored closely during the postoperative period.  Hemoglobin levels did remain stable, but he did develop orthostatic hypotension, requiring treatment with normal saline. Medications were adjusted, with  antihypertensives being placed on hold.  On hospital day #4, the patient received 1 additional unit of packed RBC transfusion, secondary to persistent orthostatic hypotension.  Repeat values, however, indicated improvement, with no significant drop in systolic, from supine to standing.  The patient was, thus, cleared for discharge.  DISCHARGE LABS:  WBC 6.4, hemoglobin 8.6, hematocrit 26 (MCV 89). Sodium 140, potassium 3.7, BUN 10, and creatinine 1.0.  OUTSTANDING LABS:  Hemoglobin low of 6.7.  Renal function remained normal.  Fecal occult blood:  Positive.  DISPOSITION:  Stable.  FOLLOWUP: 1. Follow up Dr. Verne Carrow in 2 weeks.  Arrangements to be     made through our office. 2. Follow up CBC in 1 week.  DISCHARGE MEDICATIONS: 1. Aspirin 81 mg daily. 2. Plavix 75 mg daily. 3. Ferrous sulfate 325 mg t.i.d. 4. Imdur 60 mg daily. 5. Lopressor 12.5 mg b.i.d. 6. Protonix 40 mg b.i.d. 7. Pletal 100 mg b.i.d. 8. Lipitor 20 mg daily. 9. Nitrostat 0.4 mg p.r.n.  DURATION OF DISCHARGE ENCOUNTER:  Greater than 30 minutes, including physician time.     Gene Serpe, PA-C   ______________________________ Learta Codding, MD,FACC    GS/MEDQ  D:  10/01/2010  T:  10/02/2010  Job:  [id number]  cc:   Corwin Levins, MD  Electronically Signed by Rozell Searing PA-C on 10/12/2010 05:55:40 PM Electronically Signed by Lewayne Bunting MDFACC on 10/13/2010 10:28:12 AM

## 2010-10-28 ENCOUNTER — Encounter (INDEPENDENT_AMBULATORY_CARE_PROVIDER_SITE_OTHER): Payer: Medicare Other

## 2010-10-28 DIAGNOSIS — I739 Peripheral vascular disease, unspecified: Secondary | ICD-10-CM

## 2010-10-28 DIAGNOSIS — Z48812 Encounter for surgical aftercare following surgery on the circulatory system: Secondary | ICD-10-CM

## 2010-10-28 DIAGNOSIS — M7989 Other specified soft tissue disorders: Secondary | ICD-10-CM

## 2010-10-31 LAB — BASIC METABOLIC PANEL
BUN: 10 mg/dL (ref 6–23)
CO2: 26 mEq/L (ref 19–32)
CO2: 28 mEq/L (ref 19–32)
Calcium: 8.8 mg/dL (ref 8.4–10.5)
Chloride: 105 mEq/L (ref 96–112)
Chloride: 107 mEq/L (ref 96–112)
Creatinine, Ser: 0.98 mg/dL (ref 0.4–1.5)
Creatinine, Ser: 1.03 mg/dL (ref 0.4–1.5)
Glucose, Bld: 88 mg/dL (ref 70–99)

## 2010-10-31 LAB — CK TOTAL AND CKMB (NOT AT ARMC)
CK, MB: 1.5 ng/mL (ref 0.3–4.0)
Relative Index: INVALID (ref 0.0–2.5)
Total CK: 29 U/L (ref 7–232)

## 2010-10-31 LAB — CBC
HCT: 23.9 % — ABNORMAL LOW (ref 39.0–52.0)
HCT: 28.4 % — ABNORMAL LOW (ref 39.0–52.0)
Hemoglobin: 7.7 g/dL — ABNORMAL LOW (ref 13.0–17.0)
Hemoglobin: 8.1 g/dL — ABNORMAL LOW (ref 13.0–17.0)
MCHC: 32.9 g/dL (ref 30.0–36.0)
MCHC: 33.4 g/dL (ref 30.0–36.0)
MCV: 89.2 fL (ref 78.0–100.0)
MCV: 90.1 fL (ref 78.0–100.0)
Platelets: 161 10*3/uL (ref 150–400)
Platelets: 212 10*3/uL (ref 150–400)
RBC: 2.68 MIL/uL — ABNORMAL LOW (ref 4.22–5.81)
RDW: 17.2 % — ABNORMAL HIGH (ref 11.5–15.5)
RDW: 17.3 % — ABNORMAL HIGH (ref 11.5–15.5)
WBC: 4.9 10*3/uL (ref 4.0–10.5)
WBC: 8 10*3/uL (ref 4.0–10.5)

## 2010-10-31 LAB — PROTIME-INR: Prothrombin Time: 13.2 seconds (ref 11.6–15.2)

## 2010-10-31 LAB — CARDIAC PANEL(CRET KIN+CKTOT+MB+TROPI)
CK, MB: 1.5 ng/mL (ref 0.3–4.0)
CK, MB: 2.1 ng/mL (ref 0.3–4.0)
Relative Index: INVALID (ref 0.0–2.5)
Troponin I: 0.03 ng/mL (ref 0.00–0.06)
Troponin I: 0.05 ng/mL (ref 0.00–0.06)

## 2010-10-31 LAB — HAPTOGLOBIN: Haptoglobin: 218 mg/dL — ABNORMAL HIGH (ref 16–200)

## 2010-10-31 LAB — POCT I-STAT, CHEM 8
BUN: 15 mg/dL (ref 6–23)
Calcium, Ion: 1.12 mmol/L (ref 1.12–1.32)
Hemoglobin: 10.2 g/dL — ABNORMAL LOW (ref 13.0–17.0)
TCO2: 24 mmol/L (ref 0–100)

## 2010-10-31 LAB — IRON AND TIBC
Saturation Ratios: 5 % — ABNORMAL LOW (ref 20–55)
TIBC: 337 ug/dL (ref 215–435)
UIBC: 319 ug/dL

## 2010-10-31 LAB — DIFFERENTIAL
Basophils Relative: 1 % (ref 0–1)
Eosinophils Absolute: 0.2 10*3/uL (ref 0.0–0.7)
Eosinophils Relative: 2 % (ref 0–5)
Lymphs Abs: 1.4 10*3/uL (ref 0.7–4.0)

## 2010-10-31 LAB — POCT CARDIAC MARKERS

## 2010-10-31 LAB — TROPONIN I: Troponin I: 0.01 ng/mL (ref 0.00–0.06)

## 2010-10-31 LAB — CROSSMATCH
ABO/RH(D): B POS
Antibody Screen: POSITIVE

## 2010-10-31 LAB — RETICULOCYTES: Retic Ct Pct: 1.1 % (ref 0.4–3.1)

## 2010-11-02 ENCOUNTER — Ambulatory Visit (INDEPENDENT_AMBULATORY_CARE_PROVIDER_SITE_OTHER): Payer: Medicare Other | Admitting: Vascular Surgery

## 2010-11-02 DIAGNOSIS — I70219 Atherosclerosis of native arteries of extremities with intermittent claudication, unspecified extremity: Secondary | ICD-10-CM

## 2010-11-02 LAB — CBC
HCT: 27.4 % — ABNORMAL LOW (ref 39.0–52.0)
Hemoglobin: 12.8 g/dL — ABNORMAL LOW (ref 13.0–17.0)
MCHC: 34.2 g/dL (ref 30.0–36.0)
MCV: 95 fL (ref 78.0–100.0)
MCV: 95.2 fL (ref 78.0–100.0)
Platelets: 132 10*3/uL — ABNORMAL LOW (ref 150–400)
RBC: 3.95 MIL/uL — ABNORMAL LOW (ref 4.22–5.81)
RDW: 14.6 % (ref 11.5–15.5)
RDW: 14.7 % (ref 11.5–15.5)
WBC: 6.4 10*3/uL (ref 4.0–10.5)

## 2010-11-02 LAB — COMPREHENSIVE METABOLIC PANEL
ALT: 9 U/L (ref 0–53)
CO2: 27 mEq/L (ref 19–32)
Calcium: 9.5 mg/dL (ref 8.4–10.5)
Creatinine, Ser: 1.03 mg/dL (ref 0.4–1.5)
GFR calc non Af Amer: >60 mL/min (ref >60)
Glucose, Bld: 108 mg/dL — ABNORMAL HIGH (ref 70–99)
Sodium: 138 mEq/L (ref 135–145)
Total Protein: 6.7 g/dL (ref 6.0–8.3)

## 2010-11-02 LAB — BASIC METABOLIC PANEL
BUN: 7 mg/dL (ref 6–23)
Creatinine, Ser: 0.96 mg/dL (ref 0.4–1.5)
GFR calc Af Amer: >60 mL/min (ref >60)
GFR calc non Af Amer: >60 mL/min (ref >60)
Potassium: 3.7 mEq/L (ref 3.5–5.1)

## 2010-11-02 LAB — TYPE AND SCREEN
ABO/RH(D): B POS
Antibody Screen: POSITIVE

## 2010-11-02 LAB — PROTIME-INR
INR: 0.93 (ref 0.00–1.49)
Prothrombin Time: 12.4 seconds (ref 11.6–15.2)

## 2010-11-02 LAB — URINE MICROSCOPIC-ADD ON

## 2010-11-02 LAB — MRSA PCR SCREENING: MRSA by PCR: NEGATIVE

## 2010-11-02 LAB — URINALYSIS, ROUTINE W REFLEX MICROSCOPIC
Ketones, ur: NEGATIVE mg/dL
Leukocytes, UA: NEGATIVE
Nitrite: NEGATIVE
Protein, ur: NEGATIVE mg/dL
Urobilinogen, UA: 1 mg/dL (ref 0.0–1.0)

## 2010-11-02 LAB — APTT: aPTT: 28 seconds (ref 24–37)

## 2010-11-03 NOTE — Assessment & Plan Note (Signed)
OFFICE VISIT  PENNY, FRISBIE DOB:  10-10-1934                                       11/02/2010 ZOXWR#:[id number]  I saw Mr. Jeschke in the office today for continued follow-up of his peripheral vascular disease.  This is a pleasant 76 year old gentleman who had a previous left lower extremity bypass by Dr. Madilyn Fireman.  I saw him with a nonhealing wound of his right foot with multilevel arterial occlusive disease.  On 10/01/2009, he underwent endarterectomy of an occluded right common femoral artery with profundoplasty and vein patch angioplasty of the common femoral artery and deep femoral artery.  In addition he had a right femoral to below knee pop bypass with a 6 mm Pro-patent the graft as remainder of his vein had been taking for open heart surgery.  I last saw him in March 2011.  Since I saw him last, his wounds on the right foot have completely healed.  He has had no significant claudication or rest pain in either lower extremity.  His only complaints has been some swelling in the left leg which he has had for approximately a week and half.  He did have a duplex scan in our office on October 28, 2010 which showed no evidence of DVT in the left lower extremity.  In addition, he had ABIs which showed a TBI on the right of 0.31 which was down from 0.40 and a TBI in the left at 0.35 which is up from 0.26 it.  It appeared that his right fem-pop bypass graft was occluded.  SOCIAL HISTORY:  He smokes a pack per day of cigarettes.  REVIEW OF SYSTEMS:  CARDIOVASCULAR:  He has had no chest pain, chest pressure, palpitations or arrhythmias. PULMONARY:  He has had no productive cough bronchitis, asthma or wheezing.  PHYSICAL EXAMINATION:  This is a pleasant 76 year old gentleman who appears his stated age. Blood pressure is 187/71, heart rate is 88, respiratory rate is 22. LUNGS:  Clear bilaterally to auscultation without rales, rhonchi or wheezing. CARDIOVASCULAR:   He has a regular rate and rhythm.  He has palpable femoral pulses and a palpable graft pulse on the left.  Both feet appear adequately perfused.  I cannot palpate pedal pulses. ABDOMEN:  Soft and nontender. NEUROLOGIC:  He has no focal weakness or paresthesias.  I did independently interpret the duplex which shows no evidence of DVT of the left lower extremity.  I have also independently interpreted the arterial Doppler study which shows monophasic Doppler signals in both feet with a slight drop in TBI on the right from 0.4 to 0.3.  It appears that the right fem-pop bypass graft is occluded.  However, the patient had an endarterectomy of an occluded common femoral artery segment with profundoplasty and this is patent, so the perfusion is actually very reasonable in the right foot and the wounds have healed. He has no vein available for a redo bypass and given that he is really asymptomatic, will simply continue to follow this.  I have ordered a follow-up duplex scan of the left leg graft in 6 months and will plan on seeing him back in 1 year unless there has been any change in his Doppler study in 6 months.  He knows to call sooner if he has increasing symptoms.    Di Kindle. Edilia Bo, M.D. Electronically Signed  CSD/MEDQ  D:  11/02/2010  T:  11/03/2010  Job:  [id number]

## 2010-11-03 NOTE — Procedures (Unsigned)
DUPLEX DEEP VENOUS EXAM - LOWER EXTREMITY  INDICATION:  Left foot swelling.  HISTORY:  Edema:  Left foot swelling for 4 days Trauma/Surgery:  History of bilateral lower extremity bypass grafts Pain:  No PE:  No Previous DVT:  No Anticoagulants: Other:  DUPLEX EXAM:               CFV   SFV   PopV  PTV    GSV               R  L  R  L  R  L  R   L  R  L Thrombosis    o  o     o     o      o Spontaneous   +  +     +     +      + Phasic        +  +     +     +      + Augmentation  +  +     +     +      + Compressible  +  +     +     +      + Competent  Legend:  + - yes  o - no  p - partial  D - decreased  IMPRESSION:  No evidence of deep or superficial vein thrombosis noted in the left lower extremity.  The left greater saphenous vein was not visualized due to its use for a bypass graft.  INCIDENTAL FINDING:  Enlarged lymph nodes noted in the bilateral groin regions.   _____________________________ Di Kindle. Edilia Bo, M.D.  CH/MEDQ  D:  11/01/2010  T:  11/01/2010  Job:  [id number]

## 2010-11-03 NOTE — Procedures (Unsigned)
BYPASS GRAFT EVALUATION  INDICATION:  Right lower extremity bypass graft  HISTORY: Diabetes:  no Cardiac:  no Hypertension:  yes Smoking:  yes Previous Surgery:  Left femoral to anterior tibial artery bypass graft on 01/08/2009 after a previous fem pop bypass graft occlusion, right fem pop bypass graft 10/01/2009  SINGLE LEVEL ARTERIAL EXAM                              RIGHT              LEFT Brachial:                    166                172 Anterior tibial:             monophasic         monophasic Posterior tibial:            monophasic         monophasic Peroneal:                    TBI = 0.31         TBI = 0.35 Ankle/brachial index:  PREVIOUS ABI:  Date: 08/03/2010  RIGHT:  TBI = 0.40  LEFT:  TBI = 0.26  LOWER EXTREMITY BYPASS GRAFT DUPLEX EXAM:  DUPLEX:  No flow was adequately visualized in the right lower extremity bypass graft with monophasic flow noted in the right popliteal artery via collateral circulation  IMPRESSION: 1. The right femoral and popliteal bypass graft appears totally     occluded. 2. Bilateral ankle brachial indices were not obtained due to known     noncompressible tibial vessels. 3. Monophasic waveforms noted in the bilateral tibial arteries. 4. The bilateral toe brachial indices suggest moderate to severely     decreased perfusion to the bilateral lower extremity digits;     however, do appear stable when compared to the previous exam.  At the request of Dr. Fransisco Hertz, MD on 10/28/2010, an office visit was scheduled with myself for consultation.  ___________________________________________ Di Kindle. Edilia Bo, M.D.  CH/MEDQ  D:  11/01/2010  T:  11/01/2010  Job:  [id number]

## 2010-11-04 ENCOUNTER — Other Ambulatory Visit: Payer: Self-pay | Admitting: Internal Medicine

## 2010-11-04 NOTE — Telephone Encounter (Signed)
To robin   

## 2010-11-07 ENCOUNTER — Ambulatory Visit (INDEPENDENT_AMBULATORY_CARE_PROVIDER_SITE_OTHER): Payer: Medicare Other

## 2010-11-07 DIAGNOSIS — E538 Deficiency of other specified B group vitamins: Secondary | ICD-10-CM

## 2010-11-07 MED ORDER — CYANOCOBALAMIN 1000 MCG/ML IJ SOLN
1000.0000 ug | Freq: Once | INTRAMUSCULAR | Status: AC
  Administered 2010-11-07: 1000 ug via INTRAMUSCULAR

## 2010-11-15 ENCOUNTER — Other Ambulatory Visit: Payer: Self-pay | Admitting: Internal Medicine

## 2010-11-21 LAB — CBC
HCT: 21.5 % — ABNORMAL LOW (ref 39.0–52.0)
HCT: 28.8 % — ABNORMAL LOW (ref 39.0–52.0)
Hemoglobin: 7.5 g/dL — CL (ref 13.0–17.0)
Hemoglobin: 9.8 g/dL — ABNORMAL LOW (ref 13.0–17.0)
MCHC: 33.9 g/dL (ref 30.0–36.0)
MCHC: 34.7 g/dL (ref 30.0–36.0)
MCV: 90.5 fL (ref 78.0–100.0)
MCV: 91.6 fL (ref 78.0–100.0)
MCV: 91.7 fL (ref 78.0–100.0)
Platelets: 257 10*3/uL (ref 150–400)
RBC: 3.25 MIL/uL — ABNORMAL LOW (ref 4.22–5.81)
RBC: 2.38 MIL/uL — ABNORMAL LOW (ref 4.22–5.81)
RBC: 3.14 MIL/uL — ABNORMAL LOW (ref 4.22–5.81)
RBC: 3.14 MIL/uL — ABNORMAL LOW (ref 4.22–5.81)
WBC: 4.7 10*3/uL (ref 4.0–10.5)
WBC: 4.4 10*3/uL (ref 4.0–10.5)
WBC: 4.6 10*3/uL (ref 4.0–10.5)

## 2010-11-21 LAB — POCT I-STAT, CHEM 8
BUN: 21 mg/dL (ref 6–23)
Calcium, Ion: 1.15 mmol/L (ref 1.12–1.32)
Chloride: 103 mEq/L (ref 96–112)
Chloride: 101 mEq/L (ref 96–112)
Creatinine, Ser: 1.2 mg/dL (ref 0.4–1.5)
Glucose, Bld: 123 mg/dL — ABNORMAL HIGH (ref 70–99)
HCT: 27 % — ABNORMAL LOW (ref 39.0–52.0)
Hemoglobin: 9.2 g/dL — ABNORMAL LOW (ref 13.0–17.0)
Potassium: 4.5 mEq/L (ref 3.5–5.1)
Sodium: 135 mEq/L (ref 135–145)
TCO2: 28 mmol/L (ref 0–100)

## 2010-11-21 LAB — URINALYSIS, ROUTINE W REFLEX MICROSCOPIC
Bilirubin Urine: NEGATIVE
Glucose, UA: NEGATIVE mg/dL
Hgb urine dipstick: NEGATIVE
Ketones, ur: NEGATIVE mg/dL
pH: 6.5 (ref 5.0–8.0)

## 2010-11-21 LAB — CROSSMATCH
Antibody Screen: POSITIVE
DAT, IgG: NEGATIVE
Donor AG Type: NEGATIVE

## 2010-11-21 LAB — DIFFERENTIAL
Lymphocytes Relative: 31 % (ref 12–46)
Lymphs Abs: 1.5 10*3/uL (ref 0.7–4.0)
Monocytes Relative: 11 % (ref 3–12)
Neutrophils Relative %: 55 % (ref 43–77)

## 2010-11-21 LAB — URINE CULTURE
Colony Count: NO GROWTH
Culture: NO GROWTH

## 2010-11-21 LAB — URINE MICROSCOPIC-ADD ON

## 2010-11-21 LAB — BASIC METABOLIC PANEL
BUN: 13 mg/dL (ref 6–23)
GFR calc non Af Amer: >60 mL/min (ref >60)
Glucose, Bld: 127 mg/dL — ABNORMAL HIGH (ref 70–99)
Potassium: 2.5 mEq/L — CL (ref 3.5–5.1)

## 2010-11-22 LAB — CBC
HCT: 18.9 % — ABNORMAL LOW (ref 39.0–52.0)
HCT: 21 % — ABNORMAL LOW (ref 39.0–52.0)
HCT: 27.6 % — ABNORMAL LOW (ref 39.0–52.0)
HCT: 30.3 % — ABNORMAL LOW (ref 39.0–52.0)
Hemoglobin: 10.7 g/dL — ABNORMAL LOW (ref 13.0–17.0)
Hemoglobin: 6.4 g/dL — CL (ref 13.0–17.0)
Hemoglobin: 8.2 g/dL — ABNORMAL LOW (ref 13.0–17.0)
MCHC: 34 g/dL (ref 30.0–36.0)
MCHC: 34.2 g/dL (ref 30.0–36.0)
MCHC: 34.5 g/dL (ref 30.0–36.0)
MCHC: 35.2 g/dL (ref 30.0–36.0)
MCV: 92.1 fL (ref 78.0–100.0)
MCV: 92.9 fL (ref 78.0–100.0)
MCV: 93.6 fL (ref 78.0–100.0)
Platelets: 140 10*3/uL — ABNORMAL LOW (ref 150–400)
Platelets: 70 10*3/uL — ABNORMAL LOW (ref 150–400)
Platelets: 95 10*3/uL — ABNORMAL LOW (ref 150–400)
RBC: 2.04 MIL/uL — ABNORMAL LOW (ref 4.22–5.81)
RBC: 2.24 MIL/uL — ABNORMAL LOW (ref 4.22–5.81)
RBC: 2.57 MIL/uL — ABNORMAL LOW (ref 4.22–5.81)
RBC: 2.71 MIL/uL — ABNORMAL LOW (ref 4.22–5.81)
RBC: 3.4 MIL/uL — ABNORMAL LOW (ref 4.22–5.81)
RDW: 15.3 % (ref 11.5–15.5)
RDW: 15.4 % (ref 11.5–15.5)
RDW: 16.4 % — ABNORMAL HIGH (ref 11.5–15.5)
WBC: 12.2 10*3/uL — ABNORMAL HIGH (ref 4.0–10.5)
WBC: 5.9 10*3/uL (ref 4.0–10.5)
WBC: 6 10*3/uL (ref 4.0–10.5)
WBC: 7.3 10*3/uL (ref 4.0–10.5)
WBC: 8.4 10*3/uL (ref 4.0–10.5)

## 2010-11-22 LAB — TYPE AND SCREEN
ABO/RH(D): B POS
Antibody Screen: POSITIVE
DAT, IgG: NEGATIVE
Donor AG Type: NEGATIVE
Donor AG Type: NEGATIVE
Donor AG Type: NEGATIVE

## 2010-11-22 LAB — COMPREHENSIVE METABOLIC PANEL
ALT: 33 U/L (ref 0–53)
AST: 44 U/L — ABNORMAL HIGH (ref 0–37)
Albumin: 3.4 g/dL — ABNORMAL LOW (ref 3.5–5.2)
Alkaline Phosphatase: 116 U/L (ref 39–117)
BUN: 12 mg/dL (ref 6–23)
CO2: 28 mEq/L (ref 19–32)
Calcium: 9.2 mg/dL (ref 8.4–10.5)
Chloride: 107 mEq/L (ref 96–112)
Creatinine, Ser: 0.96 mg/dL (ref 0.4–1.5)
GFR calc Af Amer: >60 mL/min (ref >60)
GFR calc non Af Amer: >60 mL/min (ref >60)
Glucose, Bld: 109 mg/dL — ABNORMAL HIGH (ref 70–99)
Potassium: 4.1 mEq/L (ref 3.5–5.1)
Sodium: 140 mEq/L (ref 135–145)
Total Bilirubin: 0.5 mg/dL (ref 0.3–1.2)
Total Protein: 5.9 g/dL — ABNORMAL LOW (ref 6.0–8.3)

## 2010-11-22 LAB — BASIC METABOLIC PANEL
BUN: 15 mg/dL (ref 6–23)
CO2: 30 mEq/L (ref 19–32)
Calcium: 8.1 mg/dL — ABNORMAL LOW (ref 8.4–10.5)
Chloride: 106 mEq/L (ref 96–112)
Creatinine, Ser: 1 mg/dL (ref 0.4–1.5)
GFR calc Af Amer: >60 mL/min (ref >60)
GFR calc Af Amer: >60 mL/min (ref >60)
GFR calc non Af Amer: >60 mL/min (ref >60)
GFR calc non Af Amer: >60 mL/min (ref >60)
Glucose, Bld: 111 mg/dL — ABNORMAL HIGH (ref 70–99)
Potassium: 3.8 mEq/L (ref 3.5–5.1)
Potassium: 4 mEq/L (ref 3.5–5.1)
Sodium: 139 mEq/L (ref 135–145)

## 2010-11-22 LAB — POCT I-STAT, CHEM 8
Chloride: 104 mEq/L (ref 96–112)
Glucose, Bld: 99 mg/dL (ref 70–99)
HCT: 30 % — ABNORMAL LOW (ref 39.0–52.0)
Hemoglobin: 10.2 g/dL — ABNORMAL LOW (ref 13.0–17.0)
Potassium: 3.7 mEq/L (ref 3.5–5.1)
Sodium: 138 mEq/L (ref 135–145)

## 2010-11-22 LAB — POCT I-STAT 4, (NA,K, GLUC, HGB,HCT)
Glucose, Bld: 102 mg/dL — ABNORMAL HIGH (ref 70–99)
HCT: 23 % — ABNORMAL LOW (ref 39.0–52.0)
Hemoglobin: 7.8 g/dL — CL (ref 13.0–17.0)
Potassium: 3.8 mEq/L (ref 3.5–5.1)
Sodium: 141 mEq/L (ref 135–145)

## 2010-11-22 LAB — URINE MICROSCOPIC-ADD ON

## 2010-11-22 LAB — GLUCOSE, CAPILLARY
Glucose-Capillary: 100 mg/dL — ABNORMAL HIGH (ref 70–99)
Glucose-Capillary: 101 mg/dL — ABNORMAL HIGH (ref 70–99)
Glucose-Capillary: 109 mg/dL — ABNORMAL HIGH (ref 70–99)
Glucose-Capillary: 130 mg/dL — ABNORMAL HIGH (ref 70–99)

## 2010-11-22 LAB — URINALYSIS, ROUTINE W REFLEX MICROSCOPIC
Nitrite: NEGATIVE
Specific Gravity, Urine: 1.019 (ref 1.005–1.030)
Urobilinogen, UA: 1 mg/dL (ref 0.0–1.0)

## 2010-11-22 LAB — APTT
aPTT: 28 seconds (ref 24–37)
aPTT: 28 seconds (ref 24–37)

## 2010-11-22 LAB — PROTIME-INR
INR: 1 (ref 0.00–1.49)
INR: 1.2 (ref 0.00–1.49)
Prothrombin Time: 12.8 seconds (ref 11.6–15.2)
Prothrombin Time: 15.4 seconds — ABNORMAL HIGH (ref 11.6–15.2)

## 2010-11-23 LAB — BASIC METABOLIC PANEL
BUN: 12 mg/dL (ref 6–23)
BUN: 12 mg/dL (ref 6–23)
BUN: 8 mg/dL (ref 6–23)
CO2: 26 mEq/L (ref 19–32)
CO2: 27 mEq/L (ref 19–32)
CO2: 28 mEq/L (ref 19–32)
Calcium: 8.4 mg/dL (ref 8.4–10.5)
Calcium: 8.7 mg/dL (ref 8.4–10.5)
Calcium: 9.1 mg/dL (ref 8.4–10.5)
Chloride: 105 mEq/L (ref 96–112)
Chloride: 109 mEq/L (ref 96–112)
Creatinine, Ser: 0.73 mg/dL (ref 0.4–1.5)
Creatinine, Ser: 0.79 mg/dL (ref 0.4–1.5)
Creatinine, Ser: 0.79 mg/dL (ref 0.4–1.5)
GFR calc Af Amer: >60 mL/min (ref >60)
GFR calc Af Amer: >60 mL/min (ref >60)
GFR calc non Af Amer: >60 mL/min (ref >60)
GFR calc non Af Amer: >60 mL/min (ref >60)
GFR calc non Af Amer: >60 mL/min (ref >60)
Glucose, Bld: 103 mg/dL — ABNORMAL HIGH (ref 70–99)
Glucose, Bld: 83 mg/dL (ref 70–99)
Glucose, Bld: 86 mg/dL (ref 70–99)
Glucose, Bld: 95 mg/dL (ref 70–99)
Potassium: 2.9 mEq/L — ABNORMAL LOW (ref 3.5–5.1)
Potassium: 3.2 mEq/L — ABNORMAL LOW (ref 3.5–5.1)
Potassium: 3.6 mEq/L (ref 3.5–5.1)
Sodium: 141 mEq/L (ref 135–145)
Sodium: 142 mEq/L (ref 135–145)
Sodium: 142 mEq/L (ref 135–145)

## 2010-11-23 LAB — DIFFERENTIAL
Basophils Absolute: 0 10*3/uL (ref 0.0–0.1)
Lymphocytes Relative: 27 % (ref 12–46)
Lymphs Abs: 1.2 10*3/uL (ref 0.7–4.0)
Monocytes Absolute: 0.3 10*3/uL (ref 0.1–1.0)
Neutro Abs: 2.9 10*3/uL (ref 1.7–7.7)

## 2010-11-23 LAB — POCT CARDIAC MARKERS
Myoglobin, poc: 43.2 ng/mL (ref 12–200)
Troponin i, poc: <0.05 ng/mL (ref 0.00–0.09)

## 2010-11-23 LAB — LIPID PANEL
Cholesterol: 102 mg/dL (ref 0–200)
LDL Cholesterol: 47 mg/dL (ref 0–99)
VLDL: 8 mg/dL (ref 0–40)

## 2010-11-23 LAB — BRAIN NATRIURETIC PEPTIDE: Pro B Natriuretic peptide (BNP): 183 pg/mL — ABNORMAL HIGH (ref 0.0–100.0)

## 2010-11-23 LAB — CARDIAC PANEL(CRET KIN+CKTOT+MB+TROPI)
CK, MB: 0.8 ng/mL (ref 0.3–4.0)
Relative Index: INVALID (ref 0.0–2.5)
Relative Index: INVALID (ref 0.0–2.5)
Total CK: 17 U/L (ref 7–232)
Total CK: 19 U/L (ref 7–232)

## 2010-11-23 LAB — CBC
HCT: 28.2 % — ABNORMAL LOW (ref 39.0–52.0)
HCT: 30 % — ABNORMAL LOW (ref 39.0–52.0)
HCT: 31.4 % — ABNORMAL LOW (ref 39.0–52.0)
Hemoglobin: 10.7 g/dL — ABNORMAL LOW (ref 13.0–17.0)
Hemoglobin: 8.8 g/dL — ABNORMAL LOW (ref 13.0–17.0)
Hemoglobin: 9.1 g/dL — ABNORMAL LOW (ref 13.0–17.0)
Hemoglobin: 9.7 g/dL — ABNORMAL LOW (ref 13.0–17.0)
MCHC: 33.8 g/dL (ref 30.0–36.0)
MCHC: 34 g/dL (ref 30.0–36.0)
MCHC: 34 g/dL (ref 30.0–36.0)
MCV: 92.3 fL (ref 78.0–100.0)
MCV: 92.9 fL (ref 78.0–100.0)
Platelets: 122 10*3/uL — ABNORMAL LOW (ref 150–400)
Platelets: 130 10*3/uL — ABNORMAL LOW (ref 150–400)
Platelets: 132 10*3/uL — ABNORMAL LOW (ref 150–400)
Platelets: 137 10*3/uL — ABNORMAL LOW (ref 150–400)
RBC: 2.79 MIL/uL — ABNORMAL LOW (ref 4.22–5.81)
RDW: 17.6 % — ABNORMAL HIGH (ref 11.5–15.5)
RDW: 17.9 % — ABNORMAL HIGH (ref 11.5–15.5)
RDW: 18 % — ABNORMAL HIGH (ref 11.5–15.5)
RDW: 18.2 % — ABNORMAL HIGH (ref 11.5–15.5)
RDW: 18.5 % — ABNORMAL HIGH (ref 11.5–15.5)
WBC: 4.4 10*3/uL (ref 4.0–10.5)
WBC: 4.5 10*3/uL (ref 4.0–10.5)
WBC: 4.6 10*3/uL (ref 4.0–10.5)
WBC: 5.2 10*3/uL (ref 4.0–10.5)

## 2010-11-23 LAB — HEPARIN LEVEL (UNFRACTIONATED)
Heparin Unfractionated: 0.25 IU/mL — ABNORMAL LOW (ref 0.30–0.70)
Heparin Unfractionated: 0.28 IU/mL — ABNORMAL LOW (ref 0.30–0.70)
Heparin Unfractionated: 0.31 IU/mL (ref 0.30–0.70)

## 2010-11-23 LAB — HEMOGLOBIN A1C
Hgb A1c MFr Bld: 5.8 % (ref 4.6–6.1)
Mean Plasma Glucose: 120 mg/dL

## 2010-11-23 LAB — TSH: TSH: 0.601 u[IU]/mL (ref 0.350–4.500)

## 2010-11-23 LAB — PROTIME-INR: Prothrombin Time: 12.5 seconds (ref 11.6–15.2)

## 2010-11-23 LAB — APTT: aPTT: 28 seconds (ref 24–37)

## 2010-11-28 LAB — POCT I-STAT, CHEM 8
Chloride: 104 mEq/L (ref 96–112)
HCT: 32 % — ABNORMAL LOW (ref 39.0–52.0)
Hemoglobin: 10.9 g/dL — ABNORMAL LOW (ref 13.0–17.0)
Potassium: 4.1 mEq/L (ref 3.5–5.1)
Sodium: 137 mEq/L (ref 135–145)

## 2010-11-29 LAB — CBC
HCT: 26.1 % — ABNORMAL LOW (ref 39.0–52.0)
HCT: 28.2 % — ABNORMAL LOW (ref 39.0–52.0)
HCT: 28.5 % — ABNORMAL LOW (ref 39.0–52.0)
Hemoglobin: 10.2 g/dL — ABNORMAL LOW (ref 13.0–17.0)
Hemoglobin: 10.6 g/dL — ABNORMAL LOW (ref 13.0–17.0)
Hemoglobin: 9.7 g/dL — ABNORMAL LOW (ref 13.0–17.0)
MCHC: 33.6 g/dL (ref 30.0–36.0)
MCHC: 34 g/dL (ref 30.0–36.0)
MCHC: 34.1 g/dL (ref 30.0–36.0)
MCHC: 34.2 g/dL (ref 30.0–36.0)
MCHC: 34.3 g/dL (ref 30.0–36.0)
MCV: 92.3 fL (ref 78.0–100.0)
MCV: 92.5 fL (ref 78.0–100.0)
Platelets: 111 10*3/uL — ABNORMAL LOW (ref 150–400)
Platelets: 214 10*3/uL (ref 150–400)
Platelets: 34 10*3/uL — CL (ref 150–400)
Platelets: 63 10*3/uL — ABNORMAL LOW (ref 150–400)
Platelets: 70 10*3/uL — ABNORMAL LOW (ref 150–400)
Platelets: 78 10*3/uL — ABNORMAL LOW (ref 150–400)
Platelets: 90 10*3/uL — ABNORMAL LOW (ref 150–400)
RBC: 3.07 MIL/uL — ABNORMAL LOW (ref 4.22–5.81)
RBC: 3.49 MIL/uL — ABNORMAL LOW (ref 4.22–5.81)
RDW: 15.7 % — ABNORMAL HIGH (ref 11.5–15.5)
RDW: 15.7 % — ABNORMAL HIGH (ref 11.5–15.5)
RDW: 15.9 % — ABNORMAL HIGH (ref 11.5–15.5)
RDW: 15.9 % — ABNORMAL HIGH (ref 11.5–15.5)
RDW: 16 % — ABNORMAL HIGH (ref 11.5–15.5)
WBC: 8.4 10*3/uL (ref 4.0–10.5)
WBC: 8.5 10*3/uL (ref 4.0–10.5)
WBC: 8.8 10*3/uL (ref 4.0–10.5)

## 2010-11-29 LAB — TYPE AND SCREEN
Antibody Screen: POSITIVE
Donor AG Type: NEGATIVE
PT AG Type: NEGATIVE

## 2010-11-29 LAB — COMPREHENSIVE METABOLIC PANEL
ALT: 13 U/L (ref 0–53)
ALT: 192 U/L — ABNORMAL HIGH (ref 0–53)
ALT: 221 U/L — ABNORMAL HIGH (ref 0–53)
ALT: 588 U/L — ABNORMAL HIGH (ref 0–53)
AST: 160 U/L — ABNORMAL HIGH (ref 0–37)
AST: 20 U/L (ref 0–37)
AST: 288 U/L — ABNORMAL HIGH (ref 0–37)
Albumin: 2.2 g/dL — ABNORMAL LOW (ref 3.5–5.2)
Albumin: 2.4 g/dL — ABNORMAL LOW (ref 3.5–5.2)
Albumin: 2.6 g/dL — ABNORMAL LOW (ref 3.5–5.2)
Albumin: 3.5 g/dL (ref 3.5–5.2)
Alkaline Phosphatase: 162 U/L — ABNORMAL HIGH (ref 39–117)
Alkaline Phosphatase: 97 U/L (ref 39–117)
Alkaline Phosphatase: 97 U/L (ref 39–117)
BUN: 14 mg/dL (ref 6–23)
BUN: 16 mg/dL (ref 6–23)
Calcium: 8 mg/dL — ABNORMAL LOW (ref 8.4–10.5)
Calcium: 8.2 mg/dL — ABNORMAL LOW (ref 8.4–10.5)
Calcium: 8.2 mg/dL — ABNORMAL LOW (ref 8.4–10.5)
Chloride: 101 mEq/L (ref 96–112)
Chloride: 103 mEq/L (ref 96–112)
Creatinine, Ser: 0.81 mg/dL (ref 0.4–1.5)
GFR calc Af Amer: >60 mL/min (ref >60)
GFR calc Af Amer: >60 mL/min (ref >60)
GFR calc Af Amer: >60 mL/min (ref >60)
Glucose, Bld: 113 mg/dL — ABNORMAL HIGH (ref 70–99)
Glucose, Bld: 116 mg/dL — ABNORMAL HIGH (ref 70–99)
Potassium: 3.4 mEq/L — ABNORMAL LOW (ref 3.5–5.1)
Potassium: 4 mEq/L (ref 3.5–5.1)
Potassium: 4.4 mEq/L (ref 3.5–5.1)
Sodium: 130 mEq/L — ABNORMAL LOW (ref 135–145)
Sodium: 133 mEq/L — ABNORMAL LOW (ref 135–145)
Sodium: 134 mEq/L — ABNORMAL LOW (ref 135–145)
Sodium: 139 mEq/L (ref 135–145)
Total Bilirubin: 0.6 mg/dL (ref 0.3–1.2)
Total Bilirubin: 0.6 mg/dL (ref 0.3–1.2)
Total Protein: 4.3 g/dL — ABNORMAL LOW (ref 6.0–8.3)
Total Protein: 4.4 g/dL — ABNORMAL LOW (ref 6.0–8.3)
Total Protein: 4.5 g/dL — ABNORMAL LOW (ref 6.0–8.3)

## 2010-11-29 LAB — BLOOD GAS, ARTERIAL
Acid-base deficit: 0.2 mmol/L (ref 0.0–2.0)
Bicarbonate: 24.6 mEq/L — ABNORMAL HIGH (ref 20.0–24.0)
Drawn by: 206361
Drawn by: 249101
O2 Saturation: 98.8 %
pCO2 arterial: 38.5 mmHg (ref 35.0–45.0)
pCO2 arterial: 44.8 mmHg (ref 35.0–45.0)
pH, Arterial: 7.428 (ref 7.350–7.450)
pO2, Arterial: 108 mmHg — ABNORMAL HIGH (ref 80.0–100.0)
pO2, Arterial: 144 mmHg — ABNORMAL HIGH (ref 80.0–100.0)

## 2010-11-29 LAB — POCT I-STAT 7, (LYTES, BLD GAS, ICA,H+H)
Acid-Base Excess: 1 mmol/L (ref 0.0–2.0)
Bicarbonate: 27.7 mEq/L — ABNORMAL HIGH (ref 20.0–24.0)
Calcium, Ion: 1.22 mmol/L (ref 1.12–1.32)
HCT: 23 % — ABNORMAL LOW (ref 39.0–52.0)
O2 Saturation: 100 %
Patient temperature: 35
Patient temperature: 35.4
TCO2: 29 mmol/L (ref 0–100)
pCO2 arterial: 39.8 mmHg (ref 35.0–45.0)
pCO2 arterial: 43.9 mmHg (ref 35.0–45.0)
pO2, Arterial: 447 mmHg — ABNORMAL HIGH (ref 80.0–100.0)
pO2, Arterial: 467 mmHg — ABNORMAL HIGH (ref 80.0–100.0)

## 2010-11-29 LAB — GLUCOSE, CAPILLARY
Glucose-Capillary: 100 mg/dL — ABNORMAL HIGH (ref 70–99)
Glucose-Capillary: 107 mg/dL — ABNORMAL HIGH (ref 70–99)
Glucose-Capillary: 110 mg/dL — ABNORMAL HIGH (ref 70–99)
Glucose-Capillary: 129 mg/dL — ABNORMAL HIGH (ref 70–99)
Glucose-Capillary: 160 mg/dL — ABNORMAL HIGH (ref 70–99)
Glucose-Capillary: 97 mg/dL (ref 70–99)
Glucose-Capillary: 99 mg/dL (ref 70–99)

## 2010-11-29 LAB — BASIC METABOLIC PANEL
BUN: 11 mg/dL (ref 6–23)
BUN: 13 mg/dL (ref 6–23)
BUN: 19 mg/dL (ref 6–23)
CO2: 28 mEq/L (ref 19–32)
Calcium: 7.8 mg/dL — ABNORMAL LOW (ref 8.4–10.5)
Chloride: 106 mEq/L (ref 96–112)
GFR calc non Af Amer: >60 mL/min (ref >60)
GFR calc non Af Amer: >60 mL/min (ref >60)
GFR calc non Af Amer: >60 mL/min (ref >60)
Glucose, Bld: 113 mg/dL — ABNORMAL HIGH (ref 70–99)
Glucose, Bld: 117 mg/dL — ABNORMAL HIGH (ref 70–99)
Glucose, Bld: 164 mg/dL — ABNORMAL HIGH (ref 70–99)
Potassium: 3.5 mEq/L (ref 3.5–5.1)
Potassium: 4.2 mEq/L (ref 3.5–5.1)
Sodium: 132 mEq/L — ABNORMAL LOW (ref 135–145)

## 2010-11-29 LAB — URINALYSIS, ROUTINE W REFLEX MICROSCOPIC
Bilirubin Urine: NEGATIVE
Glucose, UA: NEGATIVE mg/dL
Nitrite: NEGATIVE
Specific Gravity, Urine: 1.016 (ref 1.005–1.030)
pH: 6.5 (ref 5.0–8.0)

## 2010-11-29 LAB — MAGNESIUM: Magnesium: 1.7 mg/dL (ref 1.5–2.5)

## 2010-11-29 LAB — PROTIME-INR: Prothrombin Time: 15.2 seconds (ref 11.6–15.2)

## 2010-11-29 LAB — TRIGLYCERIDES: Triglycerides: 92 mg/dL (ref <150)

## 2010-11-29 LAB — DIFFERENTIAL
Basophils Absolute: 0 10*3/uL (ref 0.0–0.1)
Lymphocytes Relative: 9 % — ABNORMAL LOW (ref 12–46)
Neutro Abs: 7.3 10*3/uL (ref 1.7–7.7)

## 2010-11-29 LAB — HEPARIN ANTIBODY SCREEN: Heparin Antibody Screen: NEGATIVE

## 2010-11-29 LAB — CHOLESTEROL, TOTAL: Cholesterol: 74 mg/dL (ref 0–200)

## 2010-11-29 LAB — AMYLASE: Amylase: 81 U/L (ref 27–131)

## 2010-11-29 LAB — PHOSPHORUS: Phosphorus: 2.1 mg/dL — ABNORMAL LOW (ref 2.3–4.6)

## 2010-11-29 LAB — PREALBUMIN: Prealbumin: 9.7 mg/dL — ABNORMAL LOW (ref 18.0–45.0)

## 2010-11-29 LAB — APTT: aPTT: 28 seconds (ref 24–37)

## 2010-12-27 NOTE — Consult Note (Signed)
NAMEJAYANT, [name]                   ACCOUNT NO.:  [id number]   MEDICAL RECORD NO.:  [id number]          PATIENT TYPE:  INP   LOCATION:  1402                         FACILITY:  [facility]   PHYSICIAN:  Roberto Skye. Hart [name], M.D.  DATE OF BIRTH:  05/14/1935   DATE OF CONSULTATION:  06/19/2007  DATE OF DISCHARGE:                                 CONSULTATION   CHIEF COMPLAINT:  Postprandial abdominal pain and weight loss, rule out  mesenteric ischemia.   HISTORY OF PRESENT ILLNESS:  I was asked to see this patient in  consultation by Dr. Leone [name] regarding possible mesenteric ischemia.  The  patient is a 76 year old patient known to our practice, having had  multiple vascular procedures by Dr. Liliane [name] in the past, including  multiple left femoral-popliteal bypass grafts, most recent in 2007, and  a right femoral popliteal bypass graft, bilateral carotid  endarterectomies, coronary artery bypass grafting, and subsequently PTCA  and stenting of the saphenous vein graft to his right coronary artery in  2004.  He has also had bilateral renal artery stents.  He now presents  with a history of abdominal discomfort over the last several days with  postprandial pain and some nausea.  He had some melanotic stools, as  well, and states that he has lost approximately 25-30 pounds over the  past year.  He does not always have pain after eating and does  occasionally have loose bowel movements.  He does have a history of  peptic ulcer disease with duodenitis and an antral ulcer noted in 1997  and 2004 by upper endoscopies, and upper endoscopy on this  hospitalization revealed abnormal duodenal mucosa and antral mucosa.   PAST MEDICAL HISTORY:  1. Hypertension.  2. Hyperlipidemia.  3. Coronary artery disease, status post coronary artery bypass      grafting.  4. Renovascular disease, bilateral renal artery stents.  5. COPD.  6. Lower extremity claudications, status post bypass grafting as noted  above.  7. Cerebrovascular disease, status post bilateral carotid      endarterectomies.   ALLERGIES:  NONE KNOWN.   SOCIAL HISTORY:  The patient continues to smoke 1 pack of cigarettes per  day.  Does not use alcohol.   MEDICATIONS:  Please see history and physical in chart but does include:  1. Plavix 75 mg per day.  2. Crestor 40 mg per day.  3. Cilostazol 100 mg b.i.d.   PHYSICAL EXAMINATION:  VITAL SIGNS:  Blood pressure 120/80, heart rate  70, respirations are 14.  GENERAL:  He is a male patient who is in no apparent distress.  He is  alert and oriented x3.  NECK:  Supple, 3+ carotid pulses palpable.  NEUROLOGIC:  Exam is normal.  No palpable adenopathy in the neck.  CHEST:  Clear to auscultation.  CARDIOVASCULAR:  Reveals a regular rhythm with no murmurs.  ABDOMEN:  Soft, nontender with no palpable masses.  He has 3+ femoral,  2+ popliteal pulses bilaterally with no distal pulses palpable.  Both  feet are well-perfused.   I reviewed his CT scan  and CT angiogram.  He does have diffuse plaque  formation in his abdominal aorta, as well as in his mesenteric and renal  arteries.  His CT angiogram appears to have a possible tight stenosis at  the origin of the celiac axis which appears to be secondary to the  median arcuate ligament.  The SMA, although diseased, is widely patent  as is the IMA.  Both renal arteries have stents in their orifice and  appear to be widely patent.  There is diffuse occlusive disease  throughout.   IMPRESSION:  Abdominal pain with recent melena and postprandial  component with weight loss with history of peptic ulcer disease and  duodenitis with active duodenitis at the time of upper endoscopy.  Doubt  that his symptomatology is due to major mesenteric occlusive disease  since it appears his superior mesenteric artery and inferior mesenteric  artery are widely patent.  Would be unlikely to have these symptoms  based on isolated celiac axis  disease.  Sometimes CT angiogram can be  misleading and he may need formal angiography to further confirm this.  Would treat him currently for peptic ulcer disease, and if he is not  improved or continues to lose weight, refer back Dr. Madilyn [name] for further  evaluation.      [name], M.D.  Electronically Signed     JDL/MEDQ  D:  06/19/2007  T:  06/20/2007  Job:  [id number]   cc:   Roberto Boop, MD,FACG

## 2010-12-27 NOTE — Assessment & Plan Note (Signed)
OFFICE VISIT   ALIN, CHAVIRA  DOB:  1935/02/02                                       03/04/2009  EAVWU#:[id number]   The patient is postop left femoral anterior tibial bypass 01/08/2009 at  [facility].  This was a very difficult and long procedure,  bypassed into a very small left anterior tibial artery.  The bypass has  remained patent.   He returns today at this time for further postoperative evaluation.  The  left femoral anterior tibial bypass remains patent.  There is some small  amount of eschar over the anterior tibial incision.  Other incisions are  well-healed.  Left foot is well-perfused.   The patient has noted marked improvement in pain.  Does still have some  mild numbness in his left foot.   BP is 102/61, pulse 123 per minute.   The patient will return per protocol.   Balinda Quails, M.D.  Electronically Signed   PGH/MEDQ  D:  03/04/2009  T:  03/05/2009  Job:  [id number]

## 2010-12-27 NOTE — Consult Note (Signed)
NAMEBERRY, GODSEY                   ACCOUNT NO.:  [id number]   MEDICAL RECORD NO.:  [id number]          PATIENT TYPE:  INP   LOCATION:  1402                         FACILITY:  [facility]   PHYSICIAN:  Gerrit Friends. Dietrich Pates, MD, FACCDATE OF BIRTH:  Apr 03, 1935   DATE OF CONSULTATION:  06/21/2007  DATE OF DISCHARGE:                                 CONSULTATION   REFERRING PHYSICIAN:  Dr. Leone Payor.   PRIMARY CARE PHYSICIAN:  Dr. Jonny Ruiz.   PRIMARY CARDIOLOGIST:  Previously Dr. Samule Ohm.   PRIMARY VASCULAR SURGEON:  Dr. Madilyn Fireman.   HISTORY OF PRESENT ILLNESS:  Request for consultation is greatly  appreciated concerning this 76 year old gentleman with coronary disease  and vasculopathy.  His complex cardiovascular history is well summarized  in multiple previous notes.  He was admitted for chest discomfort in  March 2007, but was not thought to have suffered myocardial ischemia at  that time.  His last true cardiac event was in October 2005, when a drug-  eluting stent was placed to a saphenous vein graft supplying the second  diagonal.  He has had good functional status since then with no  exertional chest discomfort and no dyspnea.  Unfortunately, he continues  to smoke cigarettes.  He has not been seen by one of our cardiologists  since his discharge 18 months ago.   He is now admitted for abdominal discomfort, postprandial nausea and a  reported weight loss of 25-30 pounds over the past year.  I do not have  access to office records, but his hospital chart indicates a weight of  80 kg in March 2007 and 79 kg on admission 4 days ago.  Even in 2007,  weight loss was reported.   He has had a CT angiogram of the abdomen that reveals a small hiatal  hernia, possible thickening of the wall of the proximal duodenum,  heavily calcified visceral vessels, a left adrenal lesion consistent  with an adenoma and patent iliac stents.  Bilateral renal arteries  stents and a bypass graft at the level of the left  common femoral artery  were also visualized.  There were no comments concerning possible  stenosis of the mesenteric vessels.   [name] reports substantial improvement of symptoms since entering the  hospital.  He has eaten his last few meals without any difficulty  whatsoever.  He did develop increased edema after colonic polypectomy.  He is better following transfusion.   PAST MEDICAL HISTORY:  Past medical history is almost exclusively  related to cerebrovascular disease, coronary artery disease and  peripheral vascular disease.  The patient also has COPD and continues to  smoke cigarettes.  He has had an upper GI bleed due to duodenitis in the  past.   MEDICATIONS:  As listed in the chart.   ALLERGIES:  He has no known medical allergies.   FAMILY HISTORY, SOCIAL HISTORY AND REVIEW OF SYSTEMS:  Updated.  There  are no notable changes other than depression, weight change as described  above and arthritic discomfort. He continues have class II dyspnea on  exertion.  PHYSICAL EXAMINATION:  GENERAL:  Lanky and laconic gentleman in no acute  distress.  VITAL SIGNS:  Temperature is 97.9, heart rate 55 and regular,  respirations 18, blood pressure 125/60, O2 saturation 95% on room air.  HEENT:  Slightly sunken eyes; EOMs full; pupils equal, round and react  to light; normal oral mucosa.  NECK:  No jugular venous distention; bilateral carotid endarterectomy  scars with bilateral bruits.  CARDIAC:  Normal first and second heart sounds; fourth heart sound  present; minimal early systolic ejection murmur.  LUNGS:  Clear.  ABDOMEN:  Liver edge palpable 1-2 cm below the right costal margin;  abdomen otherwise soft and nontender without any masses; mid epigastric  bruit present.  EXTREMITIES:  Distal pulses intact; no edema.   LABORATORY AND ACCESSORY CLINICAL DATA:  Chest x-ray:  Vascular  redistribution; otherwise unremarkable.   Other laboratory notable for a hemoglobin that fell  as low as 7.1;  currently closer to 10.  Chemistry profile is normal.   IMPRESSION:  Mr. Matsumoto is doing well from a cardiac standpoint.  Having  undergone drug-eluting stent placement 3 years ago, there is some risk  to holding his antiplatelet agents including clopidogrel, but this risk  is quite acceptable and is outweighed by recent polypectomy with blood  loss.   His abdominal pain appears to be improving with the evaluation and  treatment offered to date.  His weight loss is not documented, based  upon the information available to me.  Perhaps his office are records  would provided more guidance in this regard.   With respect to possible mesenteric ischemia, [facility] is fully capable  regarding the evaluation and possible treatment of this entity.  Our  interventionists have nothing to offer beyond the care that can be  provided by the vascular surgeons.   We greatly appreciate the opportunity to become reinvolved in Mr.  Tuch's care.  At this point, the most important thing he could do to  preserve his health would be to stop smoking, but there appears to be  little enthusiasm on his part to do so.  I will arrange followup with  one of our cardiologists for continuing care of coronary artery disease.      Gerrit Friends. Dietrich Pates, MD, [facility]  Electronically Signed     RMR/MEDQ  D:  06/21/2007  T:  06/23/2007  Job:  [id number]

## 2010-12-27 NOTE — Assessment & Plan Note (Signed)
[facility]                         GASTROENTEROLOGY OFFICE NOTE   [name], [name]                          MRN:          [id number]  DATE:06/17/2007                            DOB:          1934-08-17    REQUESTING PHYSICIAN:  Corwin Levins, MD.   REASON FOR CONSULTATION:  Stomach problems and gas, not able to eat  without getting sick and hurting.   ASSESSMENT:  A 76 year old white man with a 49-month history of  postprandial nausea, dyspepsia, and epigastric distress.  He has stopped  eating because of this and has lost 25-30 pounds in a year or so.  It  has been severe for a month but has been a problem off and on.  He has  dark hemoccult-positive stool today consistent with some melena, though  his capillary refill looks good on his fingernails.  His oral mucosa is  pink, and he has good pink palmar creases, so I do not think there is a  significant anemia.  He is not tachycardic or hypotensive.   He takes Plavix, which increases his risk for bleeding, as well as an  aspirin a day.  This makes him at higher risk for endoscopic procedures.   He also has abdominal bruits.   Differential diagnoses include gastric ulcer, upper GI neoplasm,  duodenal ulcer, and the possibility of some sort of mesenteric process.  The melenic stool argues more for either ulcer or tumor in the upper GI  tract.  I should note that he is also on cilostazol which can increase  bleeding some.   PLAN:  1. Schedule upper GI endoscopy.  We will do that tomorrow.  We will      stop Plavix and aspirin in the morning, but those will still be on      board, but I think it is necessary to investigate.  2. CBC and BMET today.  3. I do not think we need to hospitalize this man based upon the      clinical scenario, but I advised the patient and his wife that if      he has more melena (about 4 episodes over the last 2 days),      hypertension symptoms, weakness or dizziness  or other problems, to      go to the emergency room tonight.  4. If the EGD is negative, then a colonoscopy should be scheduled.  If      that is unrevealing, CT scan may be necessary.  Given the bruits      and problems, one might consider that prior to a colonoscopy      depending upon the reassessment tomorrow.   HISTORY:  This man has had problems for the past year with weight loss,  but over the past month or so, he is having postprandial epigastric pain  and nausea.  The pain is minimal, but he had this severe nausea that  bothers him and comes on after he eats.  Occasionally, he has some  pressure in his chest that is relieved with belching, though he  does say  it is severe pressure pain.  He has had four dark stools in the last 2  days.  He has essentially quit eating because of this and has lost 25-30  pounds, and that has been over the year.  He denies obvious early  satiety.  He is on the antiplatelet agents as mentioned above.  He has  been on Prevacid in the past, but that has been discontinued for reasons  not entirely clear to me.  He is apparently not on a proton pump  inhibitor at this point.  He was prescribed omeprazole 20 mg 2 a day,  but it is not clear that he is on that at this time.   MEDICATIONS:  1. Imdur 60 mg daily.  2. Plavix 75 mg daily.  3. Clonidine 0.1 mg twice daily.  4. Toprol XL 100 mg daily.  5. Crestor 40 mg daily.  6. Cilostazol 100 mg b.i.d.  7. Norvasc 10 mg daily.  8. Lisinopril 20 mg daily.  9. Isosorbide 60 mg daily.  10.Aspirin 325 mg daily.  11.Nitroglycerin p.r.n.  12.Tylenol p.r.n.  13.Maalox p.r.n.   ALLERGIES:  No known drug allergies.   PAST MEDICAL HISTORY:  1. Extensive vascular disease with multiple lower extremity bypasses.  2. Popliteal bypass that occluded, was redone in 2007 by Dr. Liliane Bade.  He has actually had three left femoral-popliteal bypasses.  3. Coronary artery bypass surgery.  4. Hypertension.  5.  Dyslipidemia.  6. Chronic obstructive pulmonary disease.  7. Bilateral renal artery stenosis with stent placement in 2004.  8. Chronic smoker.  9. Bilateral carotid endarterectomy.  10.Mitral regurgitation.  11.Percutaneous transluminal cardiac angioplasty and stenting to the      saphenous vein graft to the right coronary artery in 2004 as well      as stenting to another saphenous vein graft.  He also had a drug-      eluting stent placed as well in the past.  12.EGD in the past in 1997 and 2004 by Dr. Marina Goodell and Dr. Juanda Chance,      respectively.  This demonstrated an erosive duodenitis.  13.  He      had an upper GI bleed in 1997, antral ulcer, and erosions.  CLO      testing was done.  I do not have those records at this time.  I do      not know if she has every really had H. pylori or not.   FAMILY HISTORY:  Prostate cancer in brother, heart disease in a sister  and father.  No colon cancer.   SOCIAL HISTORY:  He is married and lives with his wife.  Retired from  the school system.  He is a smoker still. No other tobacco, alcohol, or  drugs.   REVIEW OF SYSTEMS:  He has chronic pedal edema.  He has chronic  depression.  He has insomnia and a lot of fatigue.  All other systems  appear negative.   PHYSICAL EXAMINATION:  GENERAL:  An elderly white man in no acute  distress.  He looks mildly chronically ill.  VITAL SIGNS:  Height 6 feet 1 inch.  Weight 175 pounds.  Pulse 76 and  regular.  He has a blood pressure of 120/78.  HEENT:  Eyes anicteric.  Conjunctivae pink.  Mouth, posterior pharynx  with pink mucosa.  No lesions.  NECK:  Supple.  No thyromegaly or mass.  LUNGS:  Clear.  HEART:  S1, S2.  There may be a faint 2/6 systolic murmur, like a  holosystolic murmur.  There is a bruit in the left carotid area.  ABDOMEN:  Soft, nontender.  The skin is somewhat loose.  There is no  organomegaly or mass.  There is a bruit in the epigastric area and over  the right kidney area to the  right of the umbilicus.  RECTAL:  Black heme-positive stool, no mass.  Prostate is normal.  EXTREMITIES:  There is 1+ edema in the right lower extremity.  There is  a saphenous vein graft scar.  He has minimal edema on the left.  There  are scars from a left fem-pop.  SKIN:  He has pink palmar creases, and he has good capillary refill.  PSYCH:  Alert and oriented x3.   I appreciate the opportunity to care for this patient.  I have reviewed  previous hospital records as well as more recent records from Dr. Jonny Ruiz.   Note:  I will not have him take his Plavix and aspirin in the morning,  but given his history of a drug-eluting stent, the Plavix issue is going  to be watched carefully, and we may need to continue the Plavix in the  face of an ulcer and stop the aspirin.  We will see what we find.     Iva Boop, MD,FACG  Electronically Signed    CEG/MedQ  DD: 06/17/2007  DT: 06/17/2007  Job #: [id number]   cc:   Corwin Levins, MD  P. Liliane Bade, M.D.

## 2010-12-27 NOTE — Assessment & Plan Note (Signed)
OFFICE VISIT   BIAGIO, SNELSON N  DOB:  [birth year]                                       06/03/2009  ZOXWR#:[id number]   I saw the patient as an add-on today.  This is a pleasant 76 year old  gentleman who has been a long-term patient of Dr. Florina Ou.  He has had  multiple previous vascular procedures which I will document below.  Most  recently he had a left femoral endarterectomy with Dacron patch  angioplasty with left profundoplasty and left femoral to anterior tibial  artery bypass with a vein graft on 03/12/2009.  He came in today to have  his protocol study and he was noted to have a suture from the inferior  incision on the left leg where his vein was harvested.   PAST SURGICAL HISTORY:  Again, is quite complicated.  He underwent  coronary revascularization by Dr. Tyrone Sage in 1995 with vein harvested  from the right leg.  In January of 1998 he had a left femoral popliteal  artery bypass with a PTFE graft.  In November of 2000 he had a  thrombectomy of this bypass graft.  Dr. Madilyn Fireman performed a right carotid  endarterectomy in November of 2004.  He performed a left carotid  endarterectomy in October of 2005.  He had a redo left fem-pop bypass  with a 6 mm PTFE graft by Dr. Madilyn Fireman in March of 2007.  In February of  2010 he had a supraceliac aorta to hepatic artery bypass and superior  mesenteric artery bypass by Dr. Madilyn Fireman.  He then had his most recent  bypass in the left leg as described above, which was quite extensive.   His only complaint today is significant claudication in the right lower  extremity which involves his thigh and calf which is brought on by  ambulation and relieved with rest.  He also has rest pain in the right  foot.  He has had no history of nonhealing ulcers in the right foot.   PHYSICAL EXAMINATION:  General:  This is a pleasant 76 year old  gentleman who appears his stated age.  Vital signs:  Blood pressure is  162/78, heart rate is  99, saturation is 97%.  The incisions are healing  nicely.  He has a palpable femoral pulse on the left with no palpable  femoral pulse on the right.  He has no pedal pulses on the right.  He  has an easily palpable graft pulse in the left.  I cannot palpate pedal  pulses on the left.   His vascular lab study today shows a patent fem-anterior tibial artery  bypass graft with no areas of significant increased velocity.  ABI is  49% on the right and 94% on the left.   I reviewed his previous arteriogram which was done back in May of 2010.  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  The hypogastric  artery on the right is occluded.  The common femoral artery is occluded.  The deep femoral artery is patent.  There is reconstitution of the above  knee popliteal artery on the right with single vessel runoff via with  what appears to be the peroneal artery.  If his symptoms progress he has a very complicated situation on the  right in that he has significant iliac artery occlusive disease with an  occluded common femoral artery.  I think the only way to address the  iliac disease on the right would be via a brachial approach versus doing  intraoperatively at the same time of his fem-pop bypass grafting.  It  certainly would be difficult to address the proximal common iliac artery  stenosis given the disease at the bifurcation.  The other option to  consider would be right axillofemoral bypass grafting and left fem below  knee pop with a prosthetic graft.  The patient obviously has diffuse  vascular disease.  He continues to smoke a pack per day of cigarettes  and we have again discussed the importance of tobacco cessation.  He  understands this could potentially become a limb threatening problem.  I  plan on seeing him back in 2 months and  will follow his progress.  If  his symptoms progress we will have to consider revascularization despite  the risk.   Di Kindle. Edilia Bo, M.D.  Electronically Signed   CSD/MEDQ  D:  06/03/2009  T:  06/04/2009  Job:  [id number]

## 2010-12-27 NOTE — Procedures (Signed)
BYPASS GRAFT EVALUATION   INDICATION:  Followup femoral to anterior tibial artery bypass graft   HISTORY:  Diabetes:  no  Cardiac:  no  Hypertension:  yes  Smoking:  current  Previous Surgery:  Left femoral popliteal bypass graft and femoral to  anterior tibial artery bypass graft   SINGLE LEVEL ARTERIAL EXAM                               RIGHT              LEFT  Brachial:                    128                140  Anterior tibial:             58                 131  Posterior tibial:            69                 73  Peroneal:  Ankle/brachial index:        0.49               0.94   PREVIOUS ABI:  Date: 12/17/2008  RIGHT:  0.30  LEFT:  1.26  (DPA) PTA  (OCC)   DUPLEX:  Triphasic to biphasic waveforms throughout the bypass graft and  native arteries.   IMPRESSION:  1. Patent bypass graft with no evidence of stenosis.  2. Ankle brachial index on right suggests moderate to severe disease.  3. Ankle brachial index on left is within normal limits.   ___________________________________________  Di Kindle. Edilia Bo, M.D.   CJ/MEDQ  D:  06/03/2009  T:  06/03/2009  Job:  [id number]

## 2010-12-27 NOTE — Cardiovascular Report (Signed)
[name], [name]                   ACCOUNT NO.:  [id number]   MEDICAL RECORD NO.:  [id number]          PATIENT TYPE:  INP   LOCATION:  2502                         FACILITY:  [facility]   PHYSICIAN:  Verne Carrow, MDDATE OF BIRTH:  23-Sep-1934   DATE OF PROCEDURE:  11/18/2008  DATE OF DISCHARGE:                            CARDIAC CATHETERIZATION   PROCEDURE PERFORMED:  1. Percutaneous coronary intervention with placement of 2 drug-eluting      stents in the proximal and midbody of the saphenous vein graft to      the second obtuse marginal coronary artery.  2. Cutting balloon angioplasty of the in-stent restenosis in the      stented segment in the proximal body of the saphenous vein graft to      the posterior descending artery.   OPERATOR:  Verne Carrow, MD   INDICATIONS:  Unstable angina in a 76 year old male with a history of  four-vessel bypass in 1995 and extensive peripheral vascular disease  including bilateral carotid endarterectomies, 2 femoral popliteal  bypasses that have both failed, recent mesenteric and hepatic artery  bypass, prior stenting procedures in the common iliac arteries.  The  patient had a diagnostic cath performed by Dr. Arvilla Meres on November 16, 2008, that revealed high-grade stenosis in the body of the saphenous  vein graft to the obtuse marginal and in the body of the saphenous vein  graft to the posterior descending artery.  The intervention with staged  for today secondary to contrast load given during the diagnostic  catheterization.   PROCEDURE IN DETAIL:  The patient was brought to the Inpatient Cardiac  Catheterization Laboratory after signing informed consent for the  procedure.  The right groin was prepped and draped in a sterile fashion.  Lidocaine 1% was used for local anesthesia.  A 6-French sheath was  inserted into the right femoral artery using the modified Seldinger  technique.  Once arterial access was obtained, we  placed a 23-cm sheath.  My initial approach was to fix the lesions in the saphenous vein graft  to the obtuse marginal.  The patient was given a bolus of Angiomax and a  drip was started.  He had been loaded with Plavix on November 16, 2008, and  received Plavix 75 mg this morning.  The patient received 324 mg of  aspirin this morning as well.  Once our ACT was greater than 250, I used  an LCB guide to selectively engage the saphenous vein graft to the  obtuse marginal artery.  A filter wire was then advanced down to the  saphenous vein grafts with the filter deployed just prior to the  anastomosis into the native obtuse marginal coronary artery.  A 2.5x  15  mm apex balloon was inflated in the area of the 2 tightest stenoses in  the mid and proximal body of the saphenous vein graft.  A 3.0 x 28 mm  Xience drug-eluting stent was then deployed in the midportion of the  saphenous vein graft.  I then deployed a 3.5 x 23 mm Xience drug-eluting  stent in an overlapping fashion in the proximal saphenous vein graft  extending down into the stented portion of the mid saphenous vein graft.  I attempted to pass a 3.25 x 12 mm noncompliant balloon down into the  mid stented segment, however, I had considerable difficulty getting this  balloon to advance.  One inflation was made in the segment of overlap of  the 2 stents.  I then made another inflation slightly more distal.  At  this time, I removed this balloon and placed a 3.0 x 8 mm noncompliant  balloon into the distal portion of the stented segment in the mid SVG.  This was inflated without difficulty.  A 3.5 x 12 mm noncompliant  balloon was then passed over the wire and inflated in the proximal  stented portion.  The balloon was removed and an angiogram was taken.  There was excellent angiographic result with TIMI III flow down the  saphenous vein graft into the native obtuse marginal coronary artery.   At this point, I turned my attention to the  in-stent restenosis in the  stented segment of the saphenous vein graft to the right coronary  artery.  Of note, the patient had a bare metal stent placed in this  location prior to 2005 and in 2005 had a Cypher drug-eluting stent  placed over the bare metal stent.  I felt that it would not be  appropriate to place another layer of stent in this segment.  I used a  cougar intracoronary wire to engage the saphenous vein graft.  This wire  was advanced down into the native posterior descending artery.  A 2.75 x  10 mm Flextome cutting balloon was then inflated 2 times in the segment  of in-stent restenosis in the stented portion of the proximal body of  the saphenous vein graft to the PDA.  This balloon was taken to 11  atmospheres for several inflations of 40 seconds.  There was an  excellent angiographic result.  I elected not to place another stent in  this area.   The patient tolerated the procedure well and was taken to the holding  area in stable condition.  Manual pressure will be applied for  hemostasis.   IMPRESSION:  1. Successful percutaneous coronary intervention with placement of 2      drug-eluting stents in the proximal and midbody of the saphenous      vein graft to the second obtuse marginal coronary artery.  2. Successful cutting balloon angioplasty in the proximal body of the      saphenous vein graft to the posterior descending artery in the      stented segment.   RECOMMENDATIONS:  The patient will be monitored closely on telemetry  tonight.  We will continue aspirin and Plavix as well as his other  cardiac medications.  Smoking cessation is once again strongly  encouraged.      Verne Carrow, MD  Electronically Signed     CM/MEDQ  D:  11/18/2008  T:  11/19/2008  Job:  [id number]

## 2010-12-27 NOTE — Discharge Summary (Signed)
[name], [name]                   ACCOUNT NO.:  [id number]   MEDICAL RECORD NO.:  [id number]          PATIENT TYPE:  INP   LOCATION:  2025                         FACILITY:  [facility]   PHYSICIAN:  Balinda Quails, M.D.    DATE OF BIRTH:  May 10, 1935   DATE OF ADMISSION:  09/18/2008  DATE OF DISCHARGE:  09/26/2008                               DISCHARGE SUMMARY   FINAL DISCHARGE DIAGNOSES:  1. Peripheral vascular disease resulting in chronic mesenteric      ischemia.  2. Hypertension.  3. Dyslipidemia.  4. Chronic malnourished state.   PROCEDURES PERFORMED:  September 18, 2008, mesenteric revascularization,  aortic to superior mesenteric artery and celiac artery bypass grafting  using 6-mm Hemashield Dacron graft by Dr. Madilyn Fireman.   COMPLICATIONS:  None.   CONDITION AT DISCHARGE:  Stable, improving.   DISCHARGE MEDICATIONS:  He is instructed to resume all previous  medications consisting of:  1. Metoprolol ER 100 mg p.o. daily.  2. Isosorbide ER 60 mg p.o. daily.  3. Amlodipine 10 mg p.o. q.a.m.  4. Crestor 40 mg p.o. nightly.  5. Lisinopril 20 mg p.o. daily.  6. Cilostazol 100 mg p.o. b.i.d.  7. Clonidine 0.1 mg p.o. b.i.d.  8. Aspirin 81 mg p.o. daily.  9. Multivitamin with iron 1 p.o. daily.  10.Simethicone 125 mg p.o. t.i.d. following meals.  11.Percocet 5/325 one p.o. q.4 h. p.r.n. pain, total #30 were given.  12.Ensure 1 can p.o. t.i.d. with meals.  13.Ambien CR 5 mg 1 p.o. nightly p.r.n. sleep, total #20 were given.   DISPOSITION:  He is being discharged home in stable condition with his  wounds healing well.  He is given careful instructions regarding the  care of his wounds.  He is to follow up with Dr. Madilyn Fireman in 2 weeks.  The  office will arrange a visit.   BRIEF IDENTIFYING STATEMENT:  For complete details, please refer the  typed history and physical.  Briefly, this is a very pleasant 76-year-  old gentleman with peripheral vascular disease, has developed chronic  mesenteric ischemia.  He was referred to Dr. Madilyn Fireman who recommended  mesenteric revascularization.  [name] was informed of the risks and  benefits of the procedure and after careful consideration, elected to  proceed with surgery.   HOSPITAL COURSE:  Preoperative workup was completed as an outpatient.  He was brought in through same-day surgery and underwent the  aforementioned revascularization.  For complete details, please refer  the typed operative report.  The procedure was without complications.  He was returned to a bed in a surgical intensive care unit.  He was able  to be subsequently extubated.  We were able to transfer him to a bed on  a surgical step-down unit on September 22, 2008.  He was started on T&A  for his malnourished state.  Following transfer to a bed on a surgical  convalescent floor, his activity level was advanced as tolerated.  He  was started on clear liquids and slowly advanced to a regular diet.  By  September 26, 2008, he  was tolerating a regular diet.  He was walking and  improving with physical therapy.  He was desirous of discharge home and  was discharged home in stable condition on September 26, 2008.      Wilmon Arms, PA      P. Liliane Bade, M.D.  Electronically Signed    KEL/MEDQ  D:  09/26/2008  T:  09/26/2008  Job:  [id number]

## 2010-12-27 NOTE — Procedures (Signed)
BYPASS GRAFT EVALUATION   INDICATION:  Follow up left lower extremity bypass graft.   HISTORY:  Diabetes:  No.  Cardiac:  No.  Hypertension:  Yes.  Smoking:  Yes.  Previous Surgery:  Right fem-pop bypass graft on 01/08/2009, left fem-  ATA bypass graft on 10/01/2009 after occlusion of the left fem-pop  bypass graft.   SINGLE LEVEL ARTERIAL EXAM                               RIGHT              LEFT  Brachial:                    119                121  Anterior tibial:             92                 Noncompressible  Posterior tibial:            Not detected       Not detected  Peroneal:                    97                 Not detected  Ankle/brachial index:        TBI = 0.22         TBI = 0.19   PREVIOUS ABI:  Date: 10/21/2009  RIGHT:  0.51  LEFT:  0.86   LOWER EXTREMITY BYPASS GRAFT DUPLEX EXAM:   DUPLEX:  Monophasic Doppler waveforms noted throughout the left lower  extremity bypass graft at its native vessels with an increased velocity  of 235 cm/s noted in the distal native vessel.   IMPRESSION:  1. Patent left femoral-to-anterior tibial artery bypass graft with an      increased velocity noted, as described above.  2. Unreliable ankle pressures noted, which are due to the presence of      calcific vessels.  3. Severely decreased bilateral toe brachial indices noted.   ___________________________________________  Di Kindle. Edilia Bo, M.D.   CH/MEDQ  D:  12/27/2009  T:  12/27/2009  Job:  [id number]

## 2010-12-27 NOTE — Procedures (Signed)
MESENTERIC ARTERIAL DUPLEX EVALUATION   INDICATION:  Mesenteric ischemia.   HISTORY:  Diabetes:  No.  Cardiac:  No.  Hypertension:  Yes.  Smoking:  Yes.   Mesenteric Duplex Findings:  Aorta - Proximal                            62  Aorta - Mid                                 67  Aorta - Distal                              81   Celiac Trunk - Proximal                     449  Celiac Trunk - Distal                       363   Hepatic Artery                              77  Splenic Artery                              73   Superior Mesenteric Artery-Origin           336  Superior Mesenteric Artery-Proximal         470  Superior Mesenteric Artery-Mid              160  Superior Mesenteric Artery-Distal           201   Inferior Mesenteric Artery-Proximal         380   IMPRESSION:  1. Doppler velocities suggest a greater than 75% stenosis of the      proximal superior mesenteric and inferior mesenteric arteries.  2. Increased velocities are noted in the region of the celiac artery      which suggests a greater than 75% stenosis.  The proximal celiac      and superior mesenteric arteries appear to share a common origin,      however, decreased visualization is noted due to speckle artifact.  3. The aorta, proximal common hepatic and proximal splenic arteries      are patent with no focal increases in velocities noted.      ___________________________________________  P. Liliane Bade, M.D.   CH/MEDQ  D:  07/16/2008  T:  07/16/2008  Job:  [id number]

## 2010-12-27 NOTE — Assessment & Plan Note (Signed)
OFFICE VISIT   [name], [name]  DOB:  01-24-1935                                       10/08/2008  ZOXWR#:[id number]   The patient underwent treatment of chronic mesenteric ischemia with  supraceliac aorta to superior mesenteric and hepatic bypass on  09/21/2008.  He returns to the office at this time for his first  postoperative visit.  Midline incision healing well.  His appetite has  improved mildly.  No vomiting or diarrhea.  No abdominal cramps or pain.   He did have some lower extremity Dopplers done and his ankle brachial  indices measure 0.68 on the right, 1.0 on the left.  His left fem-pop  bypass however appears to be occluded, this was likely asymptomatic as  he has had no complaints of claudication or left lower extremity pain.   Will plan followup in approximately 4-6 weeks.   Balinda Quails, M.D.  Electronically Signed   PGH/MEDQ  D:  10/08/2008  T:  10/09/2008  Job:  [id number]   cc:   Iva Boop, MD,FACG  Corwin Levins, MD

## 2010-12-27 NOTE — Discharge Summary (Signed)
[name], [name]                   ACCOUNT NO.:  [id number]   MEDICAL RECORD NO.:  [id number]          PATIENT TYPE:  INP   LOCATION:  2014                         FACILITY:  [facility]   PHYSICIAN:  Balinda Quails, M.D.    DATE OF BIRTH:  04/19/1935   DATE OF ADMISSION:  01/08/2009  DATE OF DISCHARGE:  01/14/2009                               DISCHARGE SUMMARY   FINAL/DISCHARGE DIAGNOSES:  1. Severe peripheral vascular disease with history of occluded left      femoral-popliteal bypass graft.  2. Hypertension.  3. Coronary artery disease.  4. Chronic obstructive pulmonary disease.  5. Dyslipidemia.   PROCEDURES PERFORMED:  Left femoral endarterectomy with Dacron patch  angioplasty, closure of left profundoplasty, bypass of left CFA to  anterior tibial artery using reverse translocated greater saphenous vein  from the left lower extremity by Dr. Madilyn Fireman on Jan 08, 2009.   COMPLICATIONS:  None.   CONDITION AT DISCHARGE:  Stable, improving.   DISCHARGE MEDICATIONS:  He is instructed to resume all previous  medications consisting of,  1. Plavix 75 mg p.o. q.a.m.  2. Crestor 40 mg p.o. q.p.m.  3. Metoprolol succinate ER 100 mg p.o. q.a.m.  4. Aspirin 325 mg p.o. q.a.m.  5. Norvasc 10 mg p.o. q.a.m.  6. Cilostazol 100 mg p.o. b.i.d.  7. Lisinopril 20 mg q.a.m.  8. Isosorbide 60 mg p.o. q.a.m.  9. Clonidine 0.1 mg p.o. b.i.d.  10.Chantix 1 p.o. daily.  11.He is given a prescription for Percocet 5/325 one to two p.o. q.4      h. p.r.n. pain, total #40 were given.   DISPOSITION:  He is being discharged home in stable condition with his  wounds healing well.  He is given careful instructions regarding the  care of his wounds and activity level.  He is given an appointment to  see Dr. Madilyn Fireman in 3 weeks with ABIs and staple removal within 7-10 days.  The office will arrange a visit.   BRIEF IDENTIFYING STATEMENT:  For complete details, please refer the  typed history and physical.   Briefly, this very pleasant 76 year old  gentleman who was referred to Dr. Madilyn Fireman for evaluation of claudication  symptoms recurrent in his left lower extremity.  He was found to have a  critical ischemia.  Dr. Madilyn Fireman has completed his evaluation and  recommended left femoral endarterectomy with Dacron patch closure and  revascularization.  He was informed of the risks and benefits of the  procedure and after careful consideration elected to proceed with  surgery.   HOSPITAL COURSE:  Preoperative workup was completed as an outpatient.  He was brought in through same-day surgery and underwent the  aforementioned revascularization procedure.  For complete details,  please refer the typed operative report.  The procedure was without  complications.  He was returned to the Post Anesthesia Care Unit and  extubated.  Following stabilization, he was transferred to a bed in a  surgical step-down unit.  He was observed overnight and was able to be  transferred to a bed on a surgical convalescent  floor on the second  postoperative day.   Postoperatively, he experienced acute blood loss anemia.  He required  multiple units of packed red blood cells for this.  Eventually we were  able to get his H and H to 9.8 and 28.  His H and H remained stable for  24 hours.  He was found to be stable and was subsequently discharged  home in stable condition on January 14, 2009.      Wilmon Arms, PA      P. Liliane Bade, M.D.  Electronically Signed    KEL/MEDQ  D:  01/14/2009  T:  01/14/2009  Job:  [id number]

## 2010-12-27 NOTE — Assessment & Plan Note (Signed)
OFFICE VISIT   [name], [name]  DOB:  08-03-1935                                       07/16/2008  ZOXWR#:[id number]   The patient was seen today for reevaluation of chronic mesenteric  ischemia.  He has lost 10-15 pounds over the past several months.  He is  anorexic.  Intermittent diarrhea.  He has epigastric discomfort and  nausea and food fear.   He underwent a mesenteric arteriogram approximately a year ago which  revealed a celiac occlusion, SMA stenosis and IMA occlusion.   He was recently seen by Dr. 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   A duplex scan of his mesenteric vessels today reveals evidence of  stenosis of the proximal SMA greater than 75%.   The patient does look thin and cachectic.  His blood pressure is 153/71,  pulse 69 per minute, temperature 97.9.  His abdomen is soft and  nontender.  He does have an abdominal bruit.  No masses or organomegaly.  1+ femoral pulses bilaterally.   The patient may require operative revascularization for mesenteric  ischemia.  I have ordered a CT angiogram of the abdominal aorta and  mesenteric vessels to further evaluate this as his previous arteriogram  was over 1 year ago.  Dependent on the results of this will then plan  either an endovascular approach to his mesenteric ischemia or operative  revascularization.   Balinda Quails, M.D.  Electronically Signed   PGH/MEDQ  D:  07/16/2008  T:  07/17/2008  Job:  [id number]   cc:   Iva Boop, MD,FACG  Corwin Levins, MD

## 2010-12-27 NOTE — H&P (Signed)
[name], [name] NO.:  [id number]   MEDICAL RECORD NO.:  [id number]          PATIENT TYPE:  INP   LOCATION:  1823                         FACILITY:  [facility]   PHYSICIAN:  Jonelle Sidle, MD DATE OF BIRTH:  17-May-1935   DATE OF ADMISSION:  11/15/2008  DATE OF DISCHARGE:                              HISTORY & PHYSICAL   PRIMARY CARE PHYSICIAN:  Dr. [name].   VASCULAR SURGEON:  Dr. Liliane Bade.   REASON FOR ADMISSION:  Recurrent chest pain.   HISTORY OF PRESENT ILLNESS:  [name] is a complex 76 year old male  with a history of hypertension, hyperlipidemia, and severe peripheral  arterial disease status post mesenteric revascularization with aortic to  superior mesenteric artery and celiac artery bypass grafting using a  Hemashield graft by Dr. Madilyn Fireman back on February 5.  He, in addition, has  multivessel cardiovascular disease status post coronary artery bypass  grafting in 1995 associated with subsequently documented graft disease,  and most recently placement of a drug-eluting stent in the saphenous  vein graft to the second diagonal branch as well as the saphenous vein  graft to the posterior descending branch by Dr. Samule Ohm back in October  2005.  He was, prior to this, followed by Dr. [name] and has subsequently  not had any regular cardiology followup as best I can tell.  Details of  the patient's remaining coronary anatomy can be gleaned from the cardiac  catheterization report from May 26, 2004.  At that time his ejection  fraction was 50% with mid anterior hypokinesis.   [name] has been in his usual state of health with relative  deconditioning as well as weight loss/anorexia over time.  He recently  saw Dr. Madilyn Fireman back in the office in February and he was doing reasonably  well at that time with some improvement in appetite and no description  of progressive angina.  The patient presents to the emergency department  today stating that  he has had several recurrent episodes of chest pain  described as a sternal aching that has been responsive to  nitroglycerin, in fact requiring 7 sublingual nitroglycerin tablets  since early this morning.  He had a somewhat similar but less intense  episode approximately 1 week ago.  At this point his electrocardiogram  shows sinus rhythm with poor anterior R-wave progression and left  anterior fascicular block.  There is some lateral ST-segment depression  just under 1 mm.  At this point, no old tracing is yet available for  comparison.  His cardiac markers initially are normal with a troponin I  level less than 0.05 and a CK-MB less than 1.0.  He is not complaining  of any active chest pain now on intravenous heparin and nitroglycerin.   ALLERGIES:  No known drug allergies.   MEDICATIONS:  1. As of his discharge summary in February include Toprol XL 1 mg p.o.      daily.  2. Imdur 6 mg p.o. daily.  3. Amlodipine 10 mg.  4. Crestor 40 mg p.o. q.h.s.  5. Lisinopril  20 mg.  6. Cilostazol mg p.o. b.i.d.  7. Clonidine 0.10 grams p.o. b.i.d.  8. Aspirin 81 mg p.o.  9. Multivitamin with iron one daily.  10.Simethicone 125 mg p.o. t.i.d. following meals.  11.Percocet 5/325 mg p.o. q.4 hours p.r.n.  12.Ensure 1 can p.o. t.i.d. with meals.  13.Ambien CR 5 mg p.o. q.h.s. p.r.n.   PAST MEDICAL HISTORY:  Outlined above.  He has an extensive history of  peripheral arterial disease.  He has undergone prior left popliteal  bypass grafting, which has subsequently occluded.  He also has evidence  of bilateral renal artery stenosis resulting in stent placement in 2004,  reported as patent at angiography in 2005.  There is history of  hypertension, hyperlipidemia, chronic obstructive pulmonary disease,  previous bilateral carotid artery ectomy.  Recent ABIs were 0.68 on the  right and 1.0 on the left.   SOCIAL HISTORY:  The patient is married.  He is retired from the school  system.  He has a  longstanding history tobacco abuse, denies any alcohol  use.   FAMILY HISTORY:  As reviewed, significant for prostate cancer in 1  brother, heart disease in a sister.   REVIEW OF SYSTEMS:  Outlined above.  He has had increasing chest pain  symptoms over the last week, no nausea or emesis, no associated  shortness of breath.  He has been using sublingual nitroglycerin much  more frequently within the last week.  He has left leg discomfort and  numbness reporting this to be worse since his abdominal surgery.  Has  chronic shortness of breath and wheezing class II to III.  No orthopnea  or palpitations.  No recent syncope.  No bleeding diathesis otherwise  reviewed and negative.   EXAMINATION:  VITAL SIGNS:  Temperature 97.1 degrees, heart rate 91 in  sinus rhythm, respirations 18, blood pressure 133/63, oxygen saturations  92% on room air.  CONSTITUTIONAL:  This is a chronically ill-appearing male in no acute  distress without active chest pain.  HEENT: Conjunctivae normal.  Oropharynx clear.  NECK:  Supple.  No elevated jugular venous pressure, bilateral carotid  endarterectomy scars noted.  LUNGS:  Diminished breath sounds throughout.  No active wheezing.  CARDIAC:  Exam reveals a soft systolic murmur at the base.  PMI is  indistinct.  No pericardial rub.  Status post previous median  sternotomy.  ABDOMEN:  Soft.  Well-healed incision in the midline.  Bowel sounds are  present.  No hepatomegaly.  Soft bruit.  EXTREMITIES:  Exhibit no frank pitting edema.  Distal pulses are  significantly diminished.  Some venous stasis noted.  MUSCULOSKELETAL:  No kyphosis noted.  NEURO/PSYCH:  The patient alert x3.  Affect is somewhat blunted.   LABORATORY DATA:  WBCs 4.6, hemoglobin 10.7, hematocrit 31.4, platelets  145,000.  INR 0.9.  Sodium 142, potassium 2.9, chloride 107, bicarb 29,  glucose 103, BUN 9, creatinine 0.67, CK-MB less than 1.0, troponin-I  less than 0.05, BNP 183.   Chest  x-ray reports emphysematous changes with no active process.  Heart  size mildly enlarged.   IMPRESSION:  1. Symptoms concerning for unstable angina with onset over the last      week requiring increased sublingual nitroglycerin use.      Electrocardiogram is abnormal at rest as detailed above, but      overall nonspecific.  He does have some potential lateral ischemic      changes.  Initial point of care markers are normal and he  is      symptom free on intravenous heparin and nitroglycerin.  Does not      appear the has had any ischemic evaluation over the last several      years following drug-eluting stent placement to bypass grafts in      2005 by Dr. Samule Ohm.  He has not had regular cardiology followup.  2. Substantial peripheral arterial and mesenteric disease status post      recent supraceliac aorta to superior mesenteric and hepatic bypass      in February by Dr. Madilyn Fireman.  He has had some improvement in his      appetite but a long history of weight loss related to this.  ABIs      done recently are 0.68 on the right, 1.0 left per Dr. Madilyn Fireman' most      recent note.  3. Severe hypokalemia.  4. History of carotid artery disease status post previous carotid      endarterectomy bilaterally.  5. Coronary artery disease status post coronary artery bypass      grafting.  Subsequent documentation of graft disease and placement      of drug-eluting stents to the saphenous vein graft to the second      diagonal and saphenous vein graft to the posterior descending by      Dr. Samule Ohm in October 2005.  Left ventricular ejection fraction was      50% that time.  6. History of renal artery stenosis status post bilateral stent      placement, widely patent angiography in 2005.  7. Longstanding history of tobacco abuse and obstructive pulmonary      disease.  8. Hyperlipidemia, on Crestor.  9. Hypertension.   PLAN:  The patient to be admitted to the step-down unit for further  evaluation.  We  will continue his home medications, however switch Imdur  to intravenous nitroglycerin and continue with heparin infusion.  A full  set of cardiac markers will be obtained.  Potassium will be repleted.  I  would anticipate a diagnostic cardiac catheterization given his recent  onset of frequent chest pain concerning for angina to redefine his  coronary bypass graft anatomy and assess for any revascularization  options.  Lipid panel will be obtained.  Further plans to follow.      Jonelle Sidle, MD  Electronically Signed     SGM/MEDQ  D:  11/15/2008  T:  11/15/2008  Job:  [id number]   cc:   Corwin Levins, MD  P. Liliane Bade, M.D.

## 2010-12-27 NOTE — Op Note (Signed)
NAMEVICTORHUGO, [name]                   ACCOUNT NO.:  [id number]   MEDICAL RECORD NO.:  [id number]          PATIENT TYPE:  AMB   LOCATION:  SDS                          FACILITY:  [facility]   PHYSICIAN:  Balinda Quails, M.D.    DATE OF BIRTH:  01/28/1935   DATE OF PROCEDURE:  08/21/2008  DATE OF DISCHARGE:                               OPERATIVE REPORT   DIAGNOSIS:  Mesenteric ischemia.   PROCEDURES:  1. Abdominal aortogram.  2. Selective celiac and superior mesenteric arteriograms.   ACCESS:  Right common femoral artery 5-French sheath.   CONTRAST:  Visipaque 80 mL.   COMPLICATIONS:  None apparent.   CLINICAL NOTE:  Jerald Hennington is a 76 year old male with a long history of  vascular disease.  He has known mesenteric occlusive disease with recent  weight loss, and postprandial pain.  Brought to the Cath Lab at this  time for further workup with diagnostic arteriography.   PROCEDURE NOTE:  The patient was brought to the Cath Lab in stable  condition.  Placed in a supine position.  Right groin prepped and draped  in a sterile fashion.  Skin and subcutaneous tissues were instilled with  1% Xylocaine.   An 18-gauge needle introduced into the right common femoral artery.  A  0.035 Bentson guidewire advanced through the needle into the mid  abdominal aorta.  A 5-French sheath advanced over the guidewire.  Flushed with heparin saline solution.   Standard pigtail catheter advanced over the guidewire to the mid  abdominal aorta.  A biplane AP and lateral abdominal aortogram obtained.  Abdominal aortogram revealed apparent occlusion of the celiac axis, long  segmental stenosis of the origin of superior mesenteric artery, a large  inferior mesenteric artery with large meandering mesenteric artery  identified.   The pigtail catheter exchanged for a SOS catheter.  This was engaged  into the superior mesenteric artery origin.  SMA arteriogram obtained.  This revealed a long segment of disease at  the origin of the superior  mesenteric artery with high-grade stenosis.  The SOS catheter then  engaged into the origin of the celiac axis and lateral aortogram  verified occlusion of the celiac axis.   AP images verified a patent superior mesenteric artery with meandering  mesenteric artery and collateralization via the IMA and collaterals to  the celiac axis.   SOS catheter removed.  No apparent complications.  The patient  transferred to the recovery room in stable condition.   IMPRESSION:  1. Celiac axis occlusion.  2. High-grade long segmental stenosis proximal superior mesenteric      artery.  3. Patent inferior mesenteric artery with a large meandering      mesenteric artery.   DISPOSITION:  These findings are consistent with severe mesenteric  occlusive disease consistent with the patient's clinical presentation.  He is a candidate for potential operative revascularization.  We will  plan office visit and discussion of details.      Balinda Quails, M.D.  Electronically Signed     PGH/MEDQ  D:  08/21/2008  T:  08/22/2008  Job:  [id number]   cc:   Iva Boop, MD,FACG

## 2010-12-27 NOTE — Cardiovascular Report (Signed)
[name], [name]                   ACCOUNT NO.:  [id number]   MEDICAL RECORD NO.:  [id number]          PATIENT TYPE:  INP   LOCATION:  3305                         FACILITY:  [facility]   PHYSICIAN:  Bevelyn Buckles. Bensimhon, MDDATE OF BIRTH:  01-26-1935   DATE OF PROCEDURE:  11/16/2008  DATE OF DISCHARGE:                            CARDIAC CATHETERIZATION   PRIMARY CARE PHYSICIAN:  Roberto Levins, MD   PATIENT IDENTIFICATION:  [name] is a very pleasant 76 year old male  with severe peripheral arterial disease as well as coronary artery  disease status post bypass surgery in 1995 with multiple subsequent  interventions to his saphenous vein grafts most recently in 1995 by Dr.  Samule Ohm.  He was admitted with unstable angina.  He is referred for  diagnostic catheterization.   PROCEDURES PERFORMED:  1. Selective coronary angiography.  2. Saphenous vein graft angiography x3.  3. Left internal mammary artery angiography.   DESCRIPTION OF PROCEDURE:  The risks and indications were explained.  Consent was signed and placed on the chart.  A 5-French arterial sheath  was placed in the right femoral artery using a modified Seldinger  technique.  A JL-4 and JR-4 were used to image the native coronary  arteries.  A JR-4 was used to image the saphenous vein graft to the OM.  An RCB catheter was used to image the saphenous vein graft to the  diagonal and also saphenous vein sequential graft to the PDA and PL.  We  had a difficult time intubating the LIMA with the right coronary  catheter.  We were able to get this easily with No-Torque catheter.  We  then exchanged out for an IM catheter over a long exchange wire.  There  were no apparent complications.  Total contrast used was 125 mL.  Total  fluoroscopy time about 25 minutes.   Central aortic pressure 158/47 with a mean of 87.   Left main had a tubular 99% stenosis to the mid and distal section.   LAD gave off a diagonal branch.  After the  diagonal branch, there was an  diffuse 95% stenosis followed by a septal perforator and then the distal  LAD was seen filling from competitive flow from the LIMA.   Left circumflex artery was a small vessel, which had moderate diffuse  disease in the midsection.  It gave off 3 small marginals followed by a  large fourth marginal.  There is a 99% stenosis in the circumflex with  takeoff of a large fourth marginal.   Right coronary artery had a 99% ostial stenosis.  It was then totally  occluded in the midsection.  There were right-to-right bridging  collaterals filling a small portion of the mid to distal vessel and then  the distal vessel was totally occluded.   The LIMA to the LAD was widely patent.   The saphenous vein graft to the diagonal had a previously placed stent  in it distally.  The stent was patent.  There was a 40-50% anastomotic  lesion, which was not flow limiting.   Saphenous vein graft  to the OM revealed previously placed stent in the  proximal portion.  Right before the stent, there was a 40% focal  stenosis.  After the stent, there was a 95% focal stenosis followed by  serial 80-90% stenosis in the midsection.  The OM-3 filled well.   The saphenous vein sequential graft to the PDA and posterolateral had a  previously placed stent in the proximal portion with a 95% stenosis in  the middle of the stent.  The distal anastomoses were clear.   ASSESSMENT:  1. Severe native 3-vessel coronary artery disease with 99% left main      and 99% ostial right coronary artery lesion.  2. Left internal mammary artery to the left anterior descending is      widely patent.  3. The saphenous vein graft to the diagonal has a 40-50% anastomotic      lesion.  4. Saphenous vein graft to the obtuse marginal has tandem 95 and 80-      90% lesion.  5. Saphenous vein was sequential graft to the posterior descending      artery and posterolateral has a 95% in-stent restenosis.   Plan  will be for percutaneous intervention on the saphenous vein graft  to the right coronary system and the saphenous vein graft to the OM  system.  We will resume heparin 8 hours after sheath pull.      Bevelyn Buckles. Bensimhon, MD  Electronically Signed     DRB/MEDQ  D:  11/16/2008  T:  11/17/2008  Job:  [id number]

## 2010-12-27 NOTE — Assessment & Plan Note (Signed)
OFFICE VISIT   [name], [name]  DOB:  [birth year]                                       02/04/2009  ZOXWR#:[id number]   The patient returned the office today after undergoing left femoral  anterior tibial bypass Jan 08, 2009, at [facility].  This is  quite an extensive procedure with a bypass into a very small left  anterior tibial vessel.  This surprisingly did remain patent.   He returns today for a first postoperative visit.  Incisions are  healing, there is some eschar.  His graft remains patent.  His foot is  fairly well perfused.   I have renewed a prescription for Darvocet times 30 tablets.  Return in  1 month.   Balinda Quails, M.D.  Electronically Signed   PGH/MEDQ  D:  02/04/2009  T:  02/05/2009  Job:  [id number]

## 2010-12-27 NOTE — Op Note (Signed)
NAMEZADKIEL, DRAGAN                   ACCOUNT NO.:  [id number]   MEDICAL RECORD NO.:  [id number]          PATIENT TYPE:  INP   LOCATION:  2307                         FACILITY:  [facility]   PHYSICIAN:  Balinda Quails, M.D.    DATE OF BIRTH:  October 02, 1934   DATE OF PROCEDURE:  09/21/2008  DATE OF DISCHARGE:                               OPERATIVE REPORT   SURGEON:  Balinda Quails, MD   ASSISTANT:  Jerold Coombe, PA   ANESTHETIC:  General endotracheal.   ANESTHESIOLOGIST:  Laverle Hobby, MD   PREOPERATIVE DIAGNOSIS:  Chronic mesenteric ischemia.   POSTOPERATIVE DIAGNOSIS:  Chronic mesenteric ischemia.   PROCEDURE:  1. Supraceliac aorta to hepatic artery bypass.  2. Supraceliac aorta to superior mesenteric artery bypass.   CLINICAL NOTE:  [name] is a 76 year old male with known vascular  disease.  He has a history of chronic weight loss and abdominal pain.  He is a heavy tobacco user.  Workup for this revealed evidence of  celiac, superior mesenteric artery, and vascular occlusive disease.  Arteriography revealed an occluded celiac artery and a long segmental  high-grade stenosis of the proximal superior mesenteric artery.   Due to the patient's ongoing pain, chronic weight loss and food fear, he  was brought to the operating room at this time for mesenteric  revascularization.   OPERATIVE PROCEDURE:  The patient was brought to the operating room in  stable condition.  Placed under general endotracheal anesthesia.  In the  supine position, the abdomen and both legs were prepped and draped in  sterile fashion.   Monitoring lines, Swan-Ganz catheter, arterial line, and Foley catheter  were in place.   A midline skin incision was made from xiphoid to umbilicus.  Dissection  carried through the subcutaneous tissue with an electrocautery.  Deep  dissection carried down through the linea alba.  The peritoneal cavity  entered without difficulty.   The liver and gallbladder  appeared normal.  Pancreas was unremarkable.  Stomach and duodenum were normal.  Small bowel revealed no masses or  abnormalities.  Large bowel was intact.   Retroperitoneum was evaluated and the infrarenal aorta was heavily  calcified with plaque as were the common iliac arteries bilaterally.  The superior mesenteric artery could easily be palpated with a long  segment of cast like calcified plaque extending into superior mesenteric  artery.   The stomach was retracted inferiorly and the lesser sac opened through  the lesser omentum.  The esophagus was easily palpated with a  nasogastric tube in place.  Using blunt dissection, the esophagus was  encircled with a finger encompassing the esophagus and the vagus nerves  and encircled with a Penrose drain.  The esophagus then mobilized  anteriorly and the crural muscle fibers of the diaphragm surrounding the  supraceliac aorta were identified and divided with an electrocautery.  This exposed the supraceliac aorta, which also was fairly extensively  diseased with calcified plaque.  There were however, small areas free of  significant calcification in the supraceliac aorta.  The supraceliac  artery mobilized  proximally and encircled with an umbilical tape and  distally encircled with umbilical tape at the origin of the celiac  artery.  The celiac trunk were identified and followed out to a large  hepatic artery, which was freed and encircled with a vessel loop just  proximal to its bifurcation into duodenal and common hepatic artery and  ascending hepatic artery.   Attention then placed on exposure of superior mesenteric artery.  The  duodenum retracted inferiorly.  Traction placed on the small bowel  mesentery and the superior mesenteric artery could be easily palpated  due to SMV calcification.  The mesentery incised directly over the  superior mesenteric artery, which was severely diseased and followed  distally.  Throughout the length  of the superior mesenteric artery, out  to its first major branches, the artery was heavily calcified.  A large  branch was identified and this was free of significant plaque and was  encircled proximally and distally for the distal anastomosis.   A blunt tunnel was then created behind the pancreas from the base of the  small bowel mesentery up to the exposed supraceliac aorta.   The patient was administered 7000 units of heparin intravenously, 25 g  mannitol intravenously.   The supraceliac aorta was then controlled proximally and distally with  aortic DeBakey clamps.  The anterior wall was opened longitudinally with  an 11 blade and 5 punch.  A 6-mm Dacron graft was anastomosed end-to-  side to the supraceliac aorta using running 4-0 Prolene suture.  At  completion of the proximal anastomosis, the aorta was flushed and the  Dacron graft controlled with a Fogarty clamp.  The 6-mm Dacron was then  tunneled, retropancreatic down to the exposed superior mesenteric  branch.  The main superior mesenteric branch was controlled with bulldog  clamps and opened longitudinally.  The 6-mm graft beveled and  anastomosed end-to-side to the first major branch of the superior  mesenteric artery using running 6-0 Prolene suture.  At completion of  this, adequate flushing carried out.  Clamps were then removed and  excellent flow present through the graft and into the superior  mesenteric branch and retrograde into the main trunk of the superior  mesenteric artery.   Attention was then placed on the hepatic artery bypass.  The Dacron  graft was controlled proximally and distally with clamps.  Longitudinal  incision made in the graft and a second 6-mm Dacron graft was  anastomosed end-to-side to the mesenteric graft using running 6-0  Prolene suture.  This was then tunneled over to the hepatic artery,  which was controlled proximally this with bulldog clamps.  Longitudinal  arteriotomy made.  The  6-mm graft was beveled and anastomosed end-to-  side with hepatic artery using running 6-0 Prolene suture.  Clamps were  then removed.  Excellent flow present.  Adequate hemostasis obtained.  All grafts were patent.   The patient administered 50 mg of protamine intravenously.  Hemostasis  aided with Gelfoam and thrombin.   The crural diaphragm fibers were then reapproximated with interrupted 0-  Prolene suture.   The abdomen examined to assure there were no retained instruments or  sponges.  The midline fascia then closed with running #1 PDS suture.  Skin closed with staples.  Sterile dressings applied.   The patient tolerated the procedure well.  No apparent complications.  Transferred to recovery room in stable condition.      Balinda Quails, M.D.  Electronically Signed  PGH/MEDQ  D:  09/21/2008  T:  09/21/2008  Job:  [id number]

## 2010-12-27 NOTE — Procedures (Signed)
BYPASS GRAFT EVALUATION   INDICATION:  Followup evaluation of lower extremity bypass graft.   HISTORY:  Diabetes:  No.  Cardiac:  Coronary artery bypass graft.  Hypertension:  Yes.  Smoking:  Pack a day for 40 years.  Previous Surgery:  Left fem-pop bypass graft with Gore-Tex on 10/23/2005  by Dr. Madilyn Fireman.   SINGLE LEVEL ARTERIAL EXAM                               RIGHT              LEFT  Brachial:                    146                140  Anterior tibial:             50                 128  Posterior tibial:            68                 52  Peroneal:  Ankle/brachial index:        0.46               0.91   PREVIOUS ABI:  Date:  10/24/2007  RIGHT:  0.54  LEFT:  0.88   LOWER EXTREMITY BYPASS GRAFT DUPLEX EXAM:   DUPLEX:  Doppler arterial waveforms are biphasic proximal to, within and  distal to the left fem-pop bypass graft.   IMPRESSION:  1. ABIs are stable from previous study bilaterally.  2. Patent left fem-pop bypass graft.   ___________________________________________  P. Liliane Bade, M.D.   MC/MEDQ  D:  05/07/2008  T:  05/08/2008  Job:  [id number]

## 2010-12-27 NOTE — Procedures (Signed)
CAROTID DUPLEX EXAM   INDICATION:  Follow up carotid endarterectomy.   HISTORY:  Diabetes:  No.  Cardiac:  No.  Hypertension:  Yes.  Smoking:  Yes.  Previous Surgery:  Right carotid endarterectomy on 07/14/2003 and left  carotid endarterectomy on 05/24/2004, both by Dr. Madilyn Fireman.  CV History:  Currently asymptomatic.  Amaurosis Fugax No, Paresthesias No, Hemiparesis No.                                       RIGHT             LEFT  Brachial systolic pressure:         119               121  Brachial Doppler waveforms:         Normal            Normal  Vertebral direction of flow:        Antegrade         Antegrade  DUPLEX VELOCITIES (cm/sec)  CCA peak systolic                   181               182  ECA peak systolic                   156               103  ICA peak systolic                   71                133  ICA end diastolic                   24                30  PLAQUE MORPHOLOGY:                  Heterogenous      Heterogenous  PLAQUE AMOUNT:                      Mild              Moderate  PLAQUE LOCATION:                    ICA/CCA           ICA/CCA   IMPRESSION:  1. Patent bilateral carotid endarterectomy sites noted.  2. 1% to 39% stenosis of the right internal carotid artery.  3. 40% to 59% stenosis of the left internal carotid artery.  4. No significant change noted when compared to the previous      examination on 10/24/2007.   ___________________________________________  Di Kindle. Edilia Bo, M.D.   CH/MEDQ  D:  12/27/2009  T:  12/27/2009  Job:  [id number]

## 2010-12-27 NOTE — Assessment & Plan Note (Signed)
OFFICE VISIT   [name], Roberto  DOB:  10/22/1934                                       08/05/2009  JXBJY#:[id number]   I saw the patient in the office for continued followup of his peripheral  vascular disease.  This is a pleasant 76 year old gentleman who has  undergone multiple previous vascular procedures by Dr. Madilyn Fireman.  He has  stable claudication in the right leg and has a known common femoral  artery and SFA occlusion on the right with single-vessel runoff via the  peroneal artery.  On the left side he has a functioning femoral-anterior  tibial bypass graft.  He has also had bilateral carotid endarterectomies  and also a mesenteric revascularization, all by Dr. Madilyn Fireman.  His only  complaint is some paresthesias in both feet, which he has had for years.  He has had some stable claudication in the right leg for well over a  year and this has not changed significantly in character.   PAST MEDICAL HISTORY:  Otherwise significant for hypertension and  hypercholesterolemia, both of which are stable on his current  medications.  He does have a history of a previous myocardial infarction  but has had no recent cardiac issues.   SOCIAL HISTORY:  He is married.  He has 2 children.  He continues to  smoke a pack per day of cigarettes and has really never had any luck  trying to quit.   REVIEW OF SYSTEMS:  CARDIOVASCULAR:  He has had no chest pain, chest  pressure, palpitations or arrhythmias.  He has had pain in the right leg  associated with ambulation and relieved with rest.  He has had no  history of nonhealing ulcers.  He has had no history of stroke, TIAs or  amaurosis fugax and history of DVT.  NEUROLOGIC:  He has had no dizziness, blackouts, headaches or seizures.   PHYSICAL EXAMINATION:  This is a pleasant 76 year old gentleman who  appears his stated age.  Blood pressure is 179/69, heart rate is 74,  temperature is 97.8.  Lungs are clear bilaterally to  auscultation  without rales, rhonchi or wheezing.  On cardiovascular exam he has a  left carotid bruit.  He has a regular rate and rhythm without murmur  appreciated.  He has no significant peripheral edema.  He has palpable  radial pulses and a palpable left femoral pulse and graft pulse on the  left.  In addition, he has a dorsalis pedis pulse on the left.  On the  right side I cannot feel a femoral pulse, popliteal or pedal pulses.  Abdomen is soft and nontender with normal-pitched bowel sounds.  There  are no masses appreciated.  Neurologic exam:  He has no focal weakness  or paresthesias.   His symptoms have remained stable, and we would not consider  revascularization on the right unless his symptoms progressed.  He did  have an arteriogram in May which I reviewed, which shows patent  aortoiliac vessels with a right common femoral artery occlusion and  superficial femoral artery occlusion on the right with reconstitution of  the above-knee popliteal artery and single-vessel runoff via the  peroneal artery.   I will see him back in 6 months, when he will be due for a graft duplex,  ABIs and carotid duplex.  He knows to call sooner  if he has problems.  I  have also discussed with him again the importance of tobacco cessation.     Di Kindle. Edilia Bo, M.D.  Electronically Signed   CSD/MEDQ  D:  08/05/2009  T:  08/10/2009  Job:  [id number]

## 2010-12-27 NOTE — Procedures (Signed)
BYPASS GRAFT EVALUATION   INDICATION:  Follow up lower extremity bypass grafts.   HISTORY:  Diabetes:  No  Cardiac:  No  Hypertension:  Yes  Smoking:  Yes  Previous Surgery:  Left femoral to anterior tibial artery bypass graft  on 01/08/2009 after a previous fem-pop bypass graft occlusion, right fem-  pop bypass graft on 10/01/2009.   SINGLE LEVEL ARTERIAL EXAM                               RIGHT              LEFT  Brachial:                    118                137  Anterior tibial:             monophasic         monophasic  Posterior tibial:            monophasic         not detected  Peroneal:                                       not detected  Ankle/brachial index:        TBI=0.40           TBI=0.26   PREVIOUS ABI:  Date: 04/27/2010  RIGHT:  TBI=0.41  LEFT:  TBI=0.28   LOWER EXTREMITY BYPASS GRAFT DUPLEX EXAM:   DUPLEX:  Monophasic Doppler waveforms noted throughout the bilateral  lower extremity bypass grafts with no significant increased velocities.   IMPRESSION:  1. Patent right femoral-popliteal and left femoral to anterior tibial      artery bypass grafts with no evidence of stenosis.  2. Unable to obtain reliable bilateral ankle-brachial pressures due to      known noncompressible vessels.  The bilateral toe-brachial indices      suggest moderately decreased perfusion to the right digits and      moderate to severe decreased perfusion of the left digits.  Stable      bilateral toe-brachial indices noted when compared to the previous      exam.   ___________________________________________  Di Kindle. Edilia Bo, M.D.   CH/MEDQ  D:  08/03/2010  T:  08/03/2010  Job:  [id number]

## 2010-12-27 NOTE — H&P (Signed)
HISTORY AND PHYSICAL EXAMINATION   September 16, 2009   Re:  [name], [name]             DOB:  [birth year]   [name] is a patient of Dr. Edilia Bo, who is following him and is being  followed for peripheral vascular disease.   Briefly, this 76 year old gentleman was a patient of Dr. Madilyn Fireman.  He has  undergone multiple previous vascular procedures.  He has most recently  been seen by Dr. Edilia Bo on 08/05/2009.  At that time, he was  experiencing stable claudication within the right leg.  He does have a  known common femoral and SFA occlusion on the right with single vessel  runoff via the peroneal artery.  On the left, he has a functioning  femoral-to-anterior tibial bypass graft.  He has had bilateral carotid  endarterectomies and a mesenteric revascularization, all which were done  by Dr. Madilyn Fireman over the preceding years.  He returns today complaining of  pain in his right lower extremity with a sore on his right great toe.   Past medical history is significant for hypertension,  hypercholesterolemia, which are currently stable.   SOCIAL HISTORY:  He is married with 2 children.  He continues to smoke 1  pack a day, despite having been counseled on numerous times about the  benefits of discontinuation.   REVIEW OF SYSTEMS:  Other than his painful right foot, all other  interrogatories were answered negatively.   Physical findings revealed a well-nourished gentleman in no distress.  Heart rate was 78.  Blood pressure was 156/80. O2 sat was 97%.  HEENT:  NCAT, PERRLA.  EOMI.  There was no jugular venous distention or bruits  in his neck.  Lungs were clear to auscultation.  Cardiac exam revealed a  regular rate and rhythm.  The abdomen was soft, nontender, nondistended.  No masses were appreciated.  There were no major deformities of his  musculoskeletal system.  No focal weaknesses or paresthesias.  The skin  of his right foot demonstrated some cellulitis in his right  great toe  with a poorly healing fissure.   Laboratory results demonstrated ABIs of 0.44 in his right lower  extremity and 0.97 in his left.  His bypass graft was patent.  His right  ABI did suggest moderate-to-severe disease.   MEDICAL DECISION MAKING:  At this time, I did request Dr. Edilia Bo to  evaluate [name].  He entered the room and performed a physical  examination.  He did have a palpable right femoral pulse and left  femoral pulse.  Dr. Edilia Bo did appreciate the cellulitis in his foot.  He also appreciated the fact that he had no pulses in the right lower  extremity.  Dr. Edilia Bo also evaluated an aortogram with runoff, which  was before performed in May 2010.  Dr. Edilia Bo found [name] to have  multilevel vascular disease in his right lower extremity.  He did make  the following recommendations:  1. Right femoral endarterectomy with vein patch angioplasty closure.  2. Right femoral to hopefully above-knee popliteal bypass grafting.      He did quote the risk of 5%.  He also quoted complications to Mr.      [name] to include one possible graft infection, bleeding, occlusion      of the graft.   Mr. Eriksson considered his options and does desire surgery, and surgery is  scheduled for February the 18th.   In the interim, he is  to keep his skin on his right foot very well  lubricated.   Wilmon Arms, PA   Di Kindle. Edilia Bo, M.D.  Electronically Signed   KEL/MEDQ  D:  09/16/2009  T:  09/16/2009  Job:  [id number]

## 2010-12-27 NOTE — Assessment & Plan Note (Signed)
OFFICE VISIT   ROMY, MCGUE  DOB:  [birth year]                                       12/17/2008  ZOXWR#:[id number]   The patient has advanced peripheral vascular disease.  History of left  femoral popliteal bypass carried out in 1998, thrombectomy in 2000.  Redo left femoral popliteal bypass in 2007.  He has had a recent acute  occlusion of his left femoral popliteal graft.  Now complains of  bilateral foot pain.   Right ABI measures 0.30.  Right toe pressures reveal critical ischemia.  Left lower extremity reveals falsely elevated calcified vessels with  absent toe pressures.   On evaluation the patient is a 76 year old male.  Moderately cachectic.  BP 136/62, pulse 68 per minute.  Abdominal exam reveals a well-healed  midline incision.  He does have 1+ right femoral pulse and a weak left  femoral pulse.  No distal pulses.  No acute ischemic changes.   The patient has rest pain bilaterally now in his lower extremities.  Will require an arteriogram for further evaluation.  This is scheduled  at [facility] 12/23/2008.   P. Liliane Bade, M.D.  Electronically Signed   PGH/MEDQ  D:  12/17/2008  T:  12/18/2008  Job:  [id number]   cc:   Corwin Levins, MD

## 2010-12-27 NOTE — Assessment & Plan Note (Signed)
OFFICE VISIT   RICHEY, DOOLITTLE  DOB:  [birth year]                                       07/18/2007  ZOXWR#:[id number]   The patient is a 76 year old gentleman well known to me with several  previous vascular operative procedures.  He has a history of coronary  artery bypass in 1995, left femoral popliteal bypass in 1998 with  thrombectomy in 2000.  Right carotid endarterectomy in 2004.  Left  carotid endarterectomy in 2005.  Most recently underwent left redo  femoral popliteal bypass in 2007.   He was recently in the hospital with weight loss, postprandial abdominal  pain, nausea, and melanotic stools.  He was seen in consultation by Dr.  Hart Rochester with a diagnosis of mesenteric ischemia.  Endoscopy did reveal  abnormal mucosa in the antrum and duodenum.   He ultimately underwent mesenteric arteriography.  This revealed  subtotal occlusion of the celiac axis, long 70% stenosis of the SMA, and  a patent IMA.   Since discharge from the hospital, the patient has been doing well.  Denies abdominal pain.  No diarrhea.  No further melanotic stools or  rectal bleeding.  He has been eating well.  He feels that he has gained  some weight.   His Plavix has been discontinued and he takes 81 mg of aspirin daily.   No recent chest pain.  Denies shortness of breath.   On evaluation today, appears generally well.  He is alert and oriented.  In no acute distress.  BP 159/68, pulse 63 per minute and regular,  respirations 18 per minute.  His neck reveals no bruits.  His chest is  clear with equal air entry bilaterally.  Normal heart sounds without  murmurs.  Abdomen is soft and nontender.  No masses or organomegaly.  Midline abdominal bruit.  Bilateral pelvic bruits are present.  2+  femoral pulses bilaterally.  Left popliteal pulse is 2+.  No dorsalis  pedis or posterior tibial pulses on the left.  2+ edema to the left  ankle.  Absent right popliteal, posterior tibial, and  dorsalis pedis  pulses.  Both feet well-perfused.  No ischemic skin changes.   The patient seems to be doing fairly well at this time.  No further  evidence at this time of mesenteric ischemia.  Eating well without  abdominal pain, nausea, vomiting, or diarrhea.   Will plan conservative followup at this time.  Reassessment in 3 months.  At that time, he will also undergo repeat carotid Doppler and scanning  of his left femoral popliteal graft.   Balinda Quails, M.D.  Electronically Signed   PGH/MEDQ  D:  07/18/2007  T:  07/19/2007  Job:  [id number]   cc:   Iva Boop, MD,FACG  Corwin Levins, MD

## 2010-12-27 NOTE — Procedures (Signed)
BYPASS GRAFT EVALUATION   INDICATION:  Follow up left fem-pop bypass graft.   HISTORY:  Diabetes:  No.  Cardiac:  No.  Hypertension:  Yes.  Smoking:  Yes.  Previous Surgery:  Re-do of left fem-pop bypass graft on 10/23/05,  mesenteric bypass graft 2 weeks ago,   SINGLE LEVEL ARTERIAL EXAM                               RIGHT              LEFT  Brachial:                    86                 95  Anterior tibial:             65                 102  Posterior tibial:            48                 Not detected  Peroneal:                                       Not detected  Ankle/brachial index:        0.68               1.07   PREVIOUS ABI:  Date: 05/07/08  RIGHT:  0.46  LEFT:  0.91   LOWER EXTREMITY BYPASS GRAFT DUPLEX EXAM:   DUPLEX:  No flow could be detected within the left fem-pop bypass graft  with dampened monophasic flow noted in the left popliteal artery via  collateral circulation.   IMPRESSION:  1. Possible occlusion of the left femoropopliteal bypass graft, as      described above.  2. The bilateral ankle brachial indices are higher than previously      recorded; however, Doppler waveforms suggest a more significant      disease.  The bilateral ankle pressures are most likely unreliable      due to possible calcific vessels.      ___________________________________________  P. Liliane Bade, M.D.   CH/MEDQ  D:  10/08/2008  T:  10/08/2008  Job:  [id number]

## 2010-12-27 NOTE — Procedures (Signed)
BYPASS GRAFT EVALUATION   INDICATION:  Followup right lower extremity bypass graft.   HISTORY:  Diabetes:  No.  Cardiac:  No.  Hypertension:  Yes.  Smoking:  Yes.  Previous Surgery:  Left femoral to anterior tibial artery bypass graft  on 01/08/2009 after a previous fem-pop bypass graft occlusion, right fem-  pop bypass graft on 10/01/2009.   SINGLE LEVEL ARTERIAL EXAM                               RIGHT              LEFT  Brachial:                    143                152  Anterior tibial:             142                Not compressible  Posterior tibial:            168                Not detected  Peroneal:                                       Not detected  Ankle/brachial index:        TBI equals 0.41    TBI equals 0.28   PREVIOUS ABI:  Date:  12/27/2009  RIGHT:  TBI equals 0.22  LEFT:  TBI  equals 0.19   DUPLEX:  Monophasic Doppler waveforms noted throughout the right lower  extremity bypass graft and its native vessels with no increase in  velocities noted.   IMPRESSION:  1. Patent right femoral-popliteal bypass graft with no evidence of      stenosis.  2. Stable Doppler waveforms noted in the bilateral lower extremities      when compared to previous studies.  Mild increase in the right toe      brachial index noted when compared to the previous exam with the      left toe brachial index remaining essentially stable.        ___________________________________________  Di Kindle. Edilia Bo, M.D.   CH/MEDQ  D:  04/28/2010  T:  04/28/2010  Job:  [id number]

## 2010-12-27 NOTE — Assessment & Plan Note (Signed)
OFFICE VISIT   ECHO, ALLSBROOK  DOB:  June 03, 1935                                       08/27/2008  EAVWU#:[id number]   [name] has undergone an arteriogram for evaluation of chronic  mesenteric ischemia.  He has been found to have an occluded celiac  artery, long high-grade stenosis in superior mesenteric artery, patent  inferior mesenteric artery, and large ending mesenteric artery.   A Cardiolite stress test has been performed which revealed no evidence  of reversible ischemia.   I have presented the options to [name] and his wife for routine  mesenteric revascularization.  Clearly without any treatment he is not  thriving.  He has been scheduled for mesenteric artery bypass x2 to the  celiac and SMA on September 18, 2008 at [facility].   Balinda Quails, M.D.  Electronically Signed   PGH/MEDQ  D:  08/27/2008  T:  08/28/2008  Job:  [id number]   cc:   Iva Boop, MD,FACG  Corwin Levins, MD

## 2010-12-27 NOTE — Procedures (Signed)
CAROTID DUPLEX EXAM   INDICATION:  Followup evaluation of known carotid artery disease.   HISTORY:  Diabetes:  No.  Cardiac:  History of coronary artery bypass graft.  Hypertension:  Yes.  Smoking:  A pack per day for 40 years.  Previous Surgery:  Left carotid endarterectomy on May 24, 2004, by  Dr. Madilyn Fireman.  Right carotid endarterectomy with Dacron patch angioplasty  on July 14, 2003, by Dr. Madilyn Fireman.  CV History:  Patient reports no cerebrovascular symptoms at this time.  Amaurosis Fugax No, Paresthesias No, Hemiparesis No                                       RIGHT             LEFT  Brachial systolic pressure:         146               146  Brachial Doppler waveforms:         Triphasic         Triphasic  Vertebral direction of flow:        Antegrade         Antegrade  DUPLEX VELOCITIES (cm/sec)  CCA peak systolic                   116               87  ECA peak systolic                   137               131  ICA peak systolic                   71                114  ICA end diastolic                   14                15  PLAQUE MORPHOLOGY:                  Calcified         Calcified  PLAQUE AMOUNT:                      Mild              Mild  PLAQUE LOCATION:                    Proximal ICA      Proximal ICA   IMPRESSION:  ICA stenosis 20-39% bilaterally status post endarterectomy.   ___________________________________________  P. Liliane Bade, M.D.   MC/MEDQ  D:  10/28/2007  T:  10/28/2007  Job:  [id number]

## 2010-12-27 NOTE — Procedures (Signed)
BYPASS GRAFT EVALUATION   INDICATION:  Follow-up evaluation of lower extremity bypass graft.   HISTORY:  Diabetes:  No.  Cardiac:  Coronary artery bypass graft on 11/28/93.  Hypertension:  Yes.  Smoking:  A pack per day for 40 years.  Previous Surgery:  Left fem-pop bypass graft with Gore-Tex on 10/23/05  by Dr. Madilyn Fireman.   SINGLE LEVEL ARTERIAL EXAM                               RIGHT              LEFT  Brachial:                    146                146  Anterior tibial:             56                 128  Posterior tibial:            80                 100  Peroneal:  Ankle/brachial index:        0.54               0.88   PREVIOUS ABI:  Date: 08/02/06  RIGHT:  0.42  LEFT:  0.94   LOWER EXTREMITY BYPASS GRAFT DUPLEX EXAM:   DUPLEX:  Doppler arterial waveforms are biphasic proximal to, within,  and distal to the bypass graft.  Doppler arterial waveforms are  monophasic at the tibial level on the left.   IMPRESSION:  1. Ankle brachial indices are stable from the previous study      bilaterally.  2. Patent left femoropopliteal bypass graft.   ___________________________________________  P. Liliane Bade, M.D.   MC/MEDQ  D:  10/24/2007  T:  10/24/2007  Job:  [id number]

## 2010-12-27 NOTE — Op Note (Signed)
NAMEHENDRIX, YURKOVICH                   ACCOUNT NO.:  [id number]   MEDICAL RECORD NO.:  [id number]          PATIENT TYPE:  AMB   LOCATION:  SDS                          FACILITY:  [facility]   PHYSICIAN:  Larina Earthly, M.D.    DATE OF BIRTH:  04-18-1935   DATE OF PROCEDURE:  12/21/2008  DATE OF DISCHARGE:  12/21/2008                               OPERATIVE REPORT   PREOPERATIVE DIAGNOSIS:  Bilateral lower extremity arterial  insufficiency.   POSTOPERATIVE DIAGNOSIS:  Bilateral lower extremity arterial  insufficiency.   PROCEDURE:  Aortogram with bilateral lower extremity runoff with second  order catheterization of left external iliac via right femoral approach.   SURGEON:  Larina Earthly, MD   ASSISTANT:  Nurse.   ANESTHESIA:  Lidocaine 1% local.   COMPLICATIONS:  None.   DISPOSITION:  To holding area, stable.   PROCEDURE IN DETAIL:  The patient was taken to the peripheral vascular  cath lab and placed in supine position where the area of both groins  prepped and draped in usual sterile fashion.  Using a local anesthesia  and a single-wall puncture, the right common femoral artery was accessed  with an 18-gauge needle with an anterior puncture.  A Wholey wire was  passed up to the level of suprarenal aorta and a 5-French sheath was  passed over the guidewire.  A pigtail catheter was positioned at the  level of suprarenal aorta.  The patient had prior bilateral common iliac  artery stents and also prior left and right renal artery stents.  AP  projection was undertaken.  This revealed patency of both renal stents  and also the iliac stents.  There was diffuse infrarenal atherosclerotic  changes which were not flow-limiting.  The patient had a supraceliac  bypass to the mesenteric vessels and both limbs were patent.  Further  detail pictures were not obtained since runoff was the limiting issue.  Pigtail catheter was then pulled down to above the bifurcation.  Pelvic  arteriogram was  obtained.  This showed diffuse irregularity throughout  the iliac vessels bilaterally, more so in the right external iliac.  The  right internal iliac artery appeared to be occluded.  The left internal  iliac was patent.  The patient did have occlusion of the distal right  common femoral artery with reconstitution into the profunda.  The right  superficial femoral artery was chronically occluded.  The patient had  occlusion of bilateral superficial femoral arteries.  There were marked  collaterals throughout the thigh.  There was reconstitution of a  diseased above-knee popliteal artery on the right.  The artery was of  good caliber at the level of the knee on the right.  On the left, there  was a short segment reconstitution at the knee and then again  reocclusion.  Runoff on the right leg was via the peroneal artery.  On  the left, there was reconstitution of the anterior tibial and peroneal  arteries.  Additional subtraction reviews were obtained.  This was  obtained by first exchanging the pigtail catheter for a  crossover  catheter.  This was then passed down to the level of the external iliac  artery on the left.  Runoff on the left revealed the reconstitution of  anterior tibial with marked disease throughout its proximal portion.  It  was of better caliber further distally.  The peroneal artery also  reconstituted.  Lateral foot projection also showed flow into the foot  from anterior tibial artery and peroneal collaterals.  Next, the  crossover catheter was removed and right leg selected films were  obtained through the right femoral sheath.  This again showed patency of  the below-knee popliteal artery with runoff via the peroneal artery.  The patient tolerated the procedure without any complication and was  transferred to the holding area where the sheath was pulled.   FINDINGS:  1. Patent aortoiliac vessels with no flow-limiting stenosis.  2. Right common femoral artery  occlusion with occlusion of the      superficial femoral artery and reconstitution of the popliteal      artery above the knee with single-vessel runoff via the peroneal.  3. Left superficial femoral artery occlusion with popliteal artery      occlusion and reconstitution of the anterior tibial and peroneal      artery runoff to the foot.      Larina Earthly, M.D.  Electronically Signed     TFE/MEDQ  D:  12/21/2008  T:  12/22/2008  Job:  [id number]   cc:   Balinda Quails, M.D.

## 2010-12-27 NOTE — H&P (Signed)
[name], [name]                   ACCOUNT NO.:  [id number]   MEDICAL RECORD NO.:  [id number]          PATIENT TYPE:  INP   LOCATION:  1402                         FACILITY:  [facility]   PHYSICIAN:  Iva Boop, MD,FACGDATE OF BIRTH:  09-Apr-1935   DATE OF ADMISSION:  06/18/2007  DATE OF DISCHARGE:                              HISTORY & PHYSICAL   CHIEF COMPLAINT:  Weight loss and postprandial abdominal pain and  nausea.   HISTORY:  [name] is a 76 year old white male who had presented to the  office earlier this week with complaints of postprandial nausea,  dyspepsia, and epigastric distress.  He has been eating much less and  has lost 25-30 pounds over the past year due to these symptoms.  He says  that the symptoms have worsened over the past month.  He was found to  have Hemoccult positive stool which was dark and consistent with melena,  at the time he was seen by Dr. Leone Payor.  He is on chronic Plavix as well  as aspirin and Pletal, and has significant history of rather diffuse  vascular disease.  He was scheduled for upper endoscopy which he  underwent on the 4th.  This did show evidence of erythema with a scaly-  type mucosa, mild gastric retention, and antral erythema.  It was not  clear that this was the source of his blood loss or his weight loss.  Biopsies were taken, and the patient was admitted to the hospital for  further diagnostic evaluation and supportive management.   CURRENT MEDICATIONS:  1. Imdur 60 p.o. daily,  2. Plavix 75 daily.  3. Clonidine 0.1 b.i.d.  4. Toprol XL 100 daily.  5. Crestor 40 daily.  6. Cilostazol 100 mg b.i.d.  7. Norvasc 10 mg daily.  8. Lisinopril 20 p.o. daily.  9. Isosorbide 60 daily.  10.Aspirin 325 daily.  11.Nitroglycerin p.r.n.  12.Tylenol p.r.n.  13.Maalox p.r.n.   ALLERGIES:  No known drug allergies.   PAST HISTORY:  Pertinent for extensive vascular disease.  He had a  popliteal bypass that occluded, was redone in 2007  per Dr. Madilyn Fireman.  He  has actually had three left femoral-popliteal bypasses.  He has had  previous CABG, COPD, dyslipidemia, hypertension, bilateral renal artery  stenosis with stent placement in 2004.  He is a smoker.  He has had  bilateral carotid endarterectomy, had a PTCA with a stent in the right  coronary artery in 2004.   FAMILY HISTORY:  Positive for prostate cancer in one brother, heart  disease in a sister, no colon cancer.   SOCIAL HISTORY:  The patient is married, lives with his wife.  He is  retired from the school system.  He is a  smoker.  Denies any EtOH.   REVIEW OF SYSTEMS:  Pertinent for some chronic pedal edema.  He does  have chronic mild depressive symptoms as well with insomnia and fatigue.  He denied any chest pain or anginal symptoms.  PULMONARY:  Negative for  cough, shortness of breath, or sputum production.  GENITOURINARY:  Negative.  GI: As outlined above; otherwise review of systems is  negative.   PHYSICAL EXAM:  GENERAL:  A well-developed thin white male in no acute  distress, appears mildly chronically ill.  VITAL SIGNS:  Weight is 175, pulse 76.  Blood pressure 120/78.  HEENT: Nontraumatic, normocephalic.  EOMI, PERRLA.  Sclerae anicteric.  Conjunctivae are pale.  NECK:  Supple.  He does have scars from bilateral carotid  endarterectomies.  CARDIOVASCULAR:  Regular rate and rhythm with S1-S2.  No murmur, rub, or  gallop.  PULMONARY:  With decreased breath sounds bilaterally.  ABDOMEN:  Soft.  He does have an abdominal bruit.  There is no focal  tenderness.  No mass or palpable hepatosplenomegaly.  RECTAL:  Exam was heme-positive per Dr. Leone Payor with dark melenic stool  in the office.  EXTREMITIES:  No clubbing, cyanosis or edema.  NEURO:  Grossly nonfocal.   IMPRESSION:  64. A 76 year old white male with weight loss and progressive      postprandial nausea and abdominal discomfort, etiology not clear.      Rule out underlying malignancy.  Rule  out mesenteric ischemia.  2. Anemia and heme-positive stool possibly secondary to gastric      changes as noted above.  Rule out other intestinal source.  3. Vasculopathy with multiple previous vascular procedures as outlined      above including carotid endarterectomy, coronary artery bypass      graft, coronary stenting in 2004, renal artery stenting, and      multiple femoral-popliteal bypasses.  4. Hypertension.  5. Chronic obstructive pulmonary disease.  6. Dyslipidemia.   PLAN:  The patient is admitted to the service of Dr. Stan Head for IV  fluid hydration and transfusions as indicated.  CT scan of the abdomen  and pelvis, and then possible colonoscopy depending on CT findings.  For  details please see the orders.      Amy Esterwood, PA-C      Iva Boop, MD,FACG  Electronically Signed    AE/MEDQ  D:  06/20/2007  T:  06/20/2007  Job:  [id number]

## 2010-12-27 NOTE — Discharge Summary (Signed)
[name], [name] NO.:  [id number]   MEDICAL RECORD NO.:  [id number]          PATIENT TYPE:  INP   LOCATION:  2502                         FACILITY:  [facility]   PHYSICIAN:  Verne Carrow, MDDATE OF BIRTH:  1934-09-23   DATE OF ADMISSION:  11/15/2008  DATE OF DISCHARGE:  11/19/2008                               DISCHARGE SUMMARY   PRIMARY CARDIOLOGIST:  Verne Carrow, MD   PRIMARY CARE:  Corwin Levins, MD   VASCULAR SURGERY:  Balinda Quails, MD   DISCHARGE DIAGNOSES:  1. Coronary artery disease status post cardiac      catheterization/percutaneous coronary intervention this admission.      The patient underwent percutaneous coronary intervention with      placement of two drug-eluting stents in the proximal and mid body      of the saphenous vein graft to the second obtuse marginal coronary      artery.  A cutting balloon angioplasty of in-stent restenosis in      the second stented segment in the proximal body of the saphenous      vein graft to the posterior descending artery.  2. History of coronary artery disease in 1995 with multiple subsequent      interventions since that time including stenting of the vein graft      to the right coronary artery with subsequent restenosis treated      with balloon angioplasty.  Stenting of the saphenous vein graft to      the obtuse marginal.  Most recent cardiac catheterization prior to      this admission was back in 2005 at which time the patient had      placement of a drug-eluting stent in the saphenous vein graft to      the second diagonal branch and placement of a drug-eluting stent in      the saphenous vein graft to the patent ductus arteriosus.  3. Ongoing tobacco abuse.  4. Hypertension.  5. Dyslipidemia.  6. Extensive peripheral vascular disease.      a.     Carotid enterectomy bilateral.      b.     Revascularization of lower extremities as well as his renal       arteries.      c.      Renal artery stenosis status post stenting.  7. History of acute renal failure in 2007.  The patient evaluated by      Dr. Briant Cedar at which time the patient's creatinine had bumped to      9.2.  Renal failure was most likely felt to be secondary to      hypotension, volume depletion while on ACE inhibitor.  8. Chronic obstructive pulmonary disease.   HOSPITAL COURSE:  [name] was admitted on November 15, 2008, a 76 year old  Caucasian gentleman with extensive history of severe peripheral vascular  disease status post multiple procedures and also with history of  coronary artery disease status post CABG and multiple procedures  including stents who presented on this admission complaining of  recurrent  chest pain in the setting of ongoing tobacco abuse.  Cardiac  enzymes were negative.  The patient ruled out for myocardial infarction  by cardiac serial markers.  The patient admitted, anticoagulated  appropriately with heparin, continued home medications, tobacco  cessation education provided.  The patient to the Cath Lab on November 16, 2008 found to have severe native three-vessel disease.  The left  internal mammary artery to the LAD was widely patent, the saphenous vein  graft to the diagonal had a 40-50% lesion, saphenous vein graft to the  obtuse marginal had a tandem 95  and 80-90% lesion, sequential graft to  the posterior descending artery, posterolateral had a 95% in-stent  restenosis.  Plan was to gently hydrate the patient and return to the  lab for intervention.  In the interim, the patient had a 2-D  echocardiogram that showed an EF of 60%, left ventricular wall thickness  mildly to moderately increased.  There was systolic anterior motion of  the mitral valve anterior leaflet did not appear to be a significant  LVOT gradient however, the abnormal motion resulted in moderate MR.  The  patient's kidney function remained stable and the patient returned to  the Cath Lab for  intervention on November 18, 2008.  The patient tolerated  the procedure stated above without complications.  On day of discharge,  the patient had no further episodes of chest discomfort.  He did  complain of some left foot pain but this presented prior to cardiac  catheterization and interventions.  Cardiac rehab has talked with the  patient at length about the risk associated with increased tobacco abuse  and preformed cardiac disease.  The patient states he is willing to try  a nicotine patch at this time.  Hemoglobin 8.8 (this is not a new  finding the patient has been worked up for anemia before), creatinine  0.73.  The patient's physical exam note left leg is cool has nonpalpable  pulses, right leg is cool, has 1+ PT pulse.  The patient does have  positive abdominal bruit, right groin cath site without hematoma,  stable.  The patient seen by Dr. Madilyn Fireman in consult regarding left foot  pain.  This is already being followed by Dr. Madilyn Fireman as an outpatient.  Dr. Madilyn Fireman did stop in and see the patient on day of discharge.  We will  arrange for the patient to follow up outpatient in regards to his left  lower extremity possible revascularization.  The patient will need a  left lower extremity arteriogram.  We will have the patient call Dr.  Madilyn Fireman office when they open this morning at 9 a.m. and arrange for a  followup appointment.  As scheduled, Dr. Clifton James is to see the patient  in the office on November 30, 2008 at 11:45.  The patient has been given a  post cardiac catheterization discharge instructions.  Medications will  be the same with the exception of:  1. Plavix 75 mg which is new.  2. Aspirin 325 mg daily.  3. Nicotine patch over-the-counter.  The patient is to start with 21      mg patch and then titrate down as instructed.  4. He will continue his Toprol-XL 100.  5. Imdur 60.  6. Crestor 40.  7. Lisinopril 20.  8. Pletal 100 mg b.i.d.  9. Clonidine 0.1 mg b.i.d.  10.Aspirin is  being increased to 325.  11.Multivitamins.  12.Simethicone may take as previously.  13.Amlodipine 10 mg daily.  14.Percocet p.r.n.  15.Ensure with meals.  16.Ambien CR p.r.n. at bedtime.   DURATION OF DISCHARGE ENCOUNTER:  Over 30 minutes.      Dorian Pod, ACNP      Verne Carrow, MD  Electronically Signed    MB/MEDQ  D:  11/19/2008  T:  11/19/2008  Job:  [id number]   cc:   Corwin Levins, MD  P. Liliane Bade, M.D.

## 2010-12-27 NOTE — Assessment & Plan Note (Signed)
OFFICE VISIT   [name], [name]  DOB:  1935-06-15                                       10/24/2007  [id number]#:[id number]   The patient returned today for continued surveillance of his peripheral  vascular disease.  He has undergone bilateral carotid endarterectomies.  He has a history of left femoral popliteal bypass with a GORE-TEX graft,  redo carried out in 2007.   He was in the hospital at the end of last year with weight loss and  postprandial abdominal pain, nausea, and melanotic stools.  This was  concerning for mesenteric ischemia.   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   He has continued to do generally well from a GI point of view.  Does  have some loose stools.  Denies abdominal pain.  No weight loss.  Appetite is good.   He has no neurologic symptoms.  No sensory, motor, or visual deficit.   He does ambulate with some claudication in his right leg.  No left leg  discomfort.   CURRENT MEDICATIONS:  Include metoprolol 100 mg daily, amlodipine 10 mg  daily, lisinopril 20 mg daily, isosorbide 60 mg daily, Plavix 75 mg  daily, aspirin 325 mg daily, Crestor 40 mg daily, clonidine 0.1 mg  b.i.d., and cilostazol 100 mg b.i.d.   Vascular lab studies reveal ankle brachial index 0.54 in the right leg  and 0.88 in the left leg.  Patent left femoral popliteal bypass.  His  carotid duplex exam reveals widely patent endarterectomy sites  bilaterally.   The patient appears generally well, appearing alert and oriented, in no  acute distress.  No carotid bruits.  Cranial nerves intact.  Strength  equal bilaterally.  Normal heart sounds without murmurs.  His chest was  clear with equal air entry bilaterally.  His abdomen is soft and  nontender.  No masses or organomegaly.  Normal bowel sounds.  Central  abdominal bruit.  Lower extremities reveal 1+ left dorsalis pedis and  posterior tibial pulses.  Absent pedal pulses in the right leg.   The patient has fairly extensive peripheral vascular disease.  He was  weighed today and was 179 pounds.  He has no symptoms of mesenteric  ischemia present.  No recurrent cerebrovascular symptoms.  Lower  extremity peripheral vascular disease remains stable with moderate  claudication in the left leg.  Plan to follow up again in 6 months with  repeat Doppler studies.  Recently in hospital.   Balinda Quails, M.D.  Electronically Signed   PGH/MEDQ  D:  10/24/2007  T:  10/25/2007  Job:  [id number]   cc:   Iva Boop, MD,FACG  Corwin Levins, MD

## 2010-12-27 NOTE — Assessment & Plan Note (Signed)
OFFICE VISIT   TIPTON, BALLOW  DOB:  Oct 29, 1934                                       10/21/2009  [id number]#:[id number]   This is a 76 year old gentleman who has previously been followed by Dr.  Madilyn Fireman.  He presented with a nonhealing wound over the right foot.  Previous arteriogram in May of 2010 had demonstrated a right femoral  artery occlusion and diffuse iliac disease with an occluded superficial  femoral artery and reconstitution of popliteal artery and single vessel  runoff via the peroneal artery on the right.  He underwent  endarterectomy of the right common femoral artery with profundoplasty  and vein patch angioplasty and right femoral to below knee pop bypass  with a 6 mm Propaten graft.  The majority of his vein had previously  been taken for open heart surgery.  He comes in for a routine followup  visit.  Overall he has been doing quite well and has no specific  complaints.  He has been ambulating without significant claudication.  He has had no history of rest pain.  The wound on his toe has been  gradually improving.   His preoperative ankle brachial index on the right was 44%.   On examination blood pressure is 146/65, heart rate is 80.  His  incisions are both healing nicely in the right leg.  He has a strong  femoral pulse on the right.  If is difficult to feel his popliteal  pulse, however, he has brisk peroneal and posterior tibial signal on the  right.  The posterior tibial signal is actually biphasic.  ABI is 51%.  The foot is warm.   The toe appears to be healing well and the foot appears well-perfused.  I plan on seeing him back in 3 months.  We will continue to follow this  wound closely.  He knows to call sooner if he has problems.     Di Kindle. Edilia Bo, M.D.  Electronically Signed   CSD/MEDQ  D:  10/21/2009  T:  10/22/2009  Job:  [id number]

## 2010-12-27 NOTE — Assessment & Plan Note (Signed)
OFFICE VISIT   [name], [name]  DOB:  1934-10-30                                       07/23/2008  ZOXWR#:[id number]   The patient returned to the office today with results of CT angiogram.  He has moderate calcific plaque of the abdominal aorta.  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   He continues to have some symptoms consistent with chronic mesenteric  ischemia.  He has lost up to 30 pounds.   We will go and schedule him for a mesenteric arteriogram, to further  evaluate this, with plans for possible revascularization.   Balinda Quails, M.D.  Electronically Signed   PGH/MEDQ  D:  07/23/2008  T:  07/24/2008  Job:  [id number]   cc:   Iva Boop, MD,FACG  Corwin Levins, MD

## 2010-12-27 NOTE — Procedures (Signed)
BYPASS GRAFT EVALUATION   INDICATION:  Followup of left femoral to ATA bypass graft, left great  toe is discolored.   HISTORY:  Diabetes:  no  Cardiac:  no  Hypertension:  yes  Smoking:  Currently  Previous Surgery:  Left femoral to ATA bypass graft 01/08/2009.   SINGLE LEVEL ARTERIAL EXAM                               RIGHT              LEFT  Brachial:                    142                140  Anterior tibial:             Monophasic/ almost absent              138  Posterior tibial:            63                 104  Peroneal:  Ankle/brachial index:        0.44               0.97   PREVIOUS ABI:  Date: 06/03/2009  RIGHT:  0.49  LEFT:  0.94   LOWER EXTREMITY BYPASS GRAFT DUPLEX EXAM:   DUPLEX:  Triphasic to biphasic Doppler waveforms throughout bypass graft  to native arteries.   IMPRESSION:  1. Patent bypass graft with no evidence of stenosis.  2. Right ankle brachial index is stable, suggests moderate to severe      disease.  3. Left ankle brachial index is within normal limits.        ___________________________________________  Di Kindle. Edilia Bo, M.D.   CJ/MEDQ  D:  09/16/2009  T:  09/16/2009  Job:  [id number]

## 2010-12-30 NOTE — Op Note (Signed)
[name]                   ACCOUNT NO.:  [id number]   MEDICAL RECORD NO.:  [id number]          PATIENT TYPE:  INP   LOCATION:  2024                         FACILITY:  [facility]   PHYSICIAN:  Balinda Quails, M.D.    DATE OF BIRTH:  [birth year]   DATE OF PROCEDURE:  10/23/2005  DATE OF DISCHARGE:                                 OPERATIVE REPORT   SURGEON:  Denman George, MD   ASSISTANT:  Constance Holster, PA   ANESTHESIA:  General endotracheal.   PREOPERATIVE DIAGNOSIS:  Occluded left femoral popliteal bypass.   POSTOPERATIVE DIAGNOSIS:  Occluded left femoral popliteal bypass.   PROCEDURE:  Redo left femoral popliteal bypass with 6 mm Gore-Tex graft   CLINICAL NOTE:  [name] is a 76 year old male who was admitted to the  hospital last week with acute dehydration and acute renal failure. At that  time he was found to have an occluded left femoral popliteal Gore-Tex bypass  graft. His left leg was viable. He underwent adequate resuscitation, and  with improvement in his renal function he is brought to the operating room  at this time for exploration of his occluded left femoral popliteal graft.   OPERATIVE PROCEDURE:  The patient left the operating room in stable  condition. He was placed in the supine position. General endotracheal  anesthesia induced. Left leg prepped and draped in sterile fashion.   Longitudinal skin incision made through the scar in the left groin.  Dissection carried down through subcutaneous tissue. Proximal portion of the  Gore-Tex left femoral popliteal bypass graft was identified. This was  mobilized adequately and encircled with a vessel loop. The graft opened  through a transverse incision. Fogarty 4 and 5 catheters were attempted to  be passed up across the proximal anastomosis. Adequate arterial inflow was  not obtained. The graft was therefore mobilized up to the common femoral  artery. The common femoral, common profunda and superficial  femoral arteries  were all dissected out and encircled with vessel loops. The patient was  administered 5000 units of heparin intravenously. The proximal arterial  anastomosis was taken down. There was quite solid thrombus present. Inflow  was good.   The #4 and #5 Fogarty's were then passed distally down the graft. Several  passes made. However, these could not be passed beyond the distal  anastomosis.   The distal anastomosis was therefore explored. Longitudinal skin incision  made through the scar into the distal left thigh. Dissection carried down  through subcutaneous tissue. Sartorius muscle reflected anteriorly. The  graft identified. Followed down to an end-to- side anastomosis to the above-  knee popliteal artery. The popliteal artery just beyond the anastomosis was  freed and encircled with a vessel loop.   A new subsartorial tunnel was then created between the two incisions. A 6 mm  Intering graft was placed through the tunnel.   The proximal anastomosis was then carried out end-to-side to the common  femoral artery, using running 6-0 Prolene suture. At completion of the  proximal anastomosis, the graft was flushed and  controlled with a fistula  clamp.   The above-knee popliteal artery was controlled proximally and distally with  bulldog clamps. Longitudinal arteriotomy was made in the above-knee  popliteal artery, just beyond the previous anastomosis. A #4 Fogarty was  passed down the popliteal artery. No significant thrombus returned. The  distal anastomosis was carried out end-to-side between the graft and the  above-knee popliteal artery, using running 6-0 Prolene suture. The clamps  were then removed. Excellent flow was present. Adequate hemostasis obtained.  Sponges and instrument counts were correct.   Hemostasis was aided with BioGlue. The wounds were irrigated with saline  solution.  The groin incision closed with running 2-0 Vicryl suture in two  subcutaneous  layers, and staples applied to the skin.  The above-knee  popliteal incision was closed with deep layer of running 2-0 Vicryl suture  in the fascia. Running 2-0 Vicryl sutures in the subcutaneous layer. Staples  applied to skin.   The patient tolerated procedure well. There were no apparent complications.  Intraoperative arteriography was not obtained, due to history of recent  renal failure.      Balinda Quails, M.D.  Electronically Signed     PGH/MEDQ  D:  10/23/2005  T:  10/24/2005  Job:  [id number]

## 2010-12-30 NOTE — Discharge Summary (Signed)
[name], [name]                   ACCOUNT NO.:  [id number]   MEDICAL RECORD NO.:  [id number]          PATIENT TYPE:  INP   LOCATION:  3311                         FACILITY:  [facility]   PHYSICIAN:  Balinda Quails, M.D.    DATE OF BIRTH:  [birth year]   DATE OF ADMISSION:  05/24/2004  DATE OF DISCHARGE:  05/25/2004                                 DISCHARGE SUMMARY   ADMISSION DIAGNOSIS:  Severe left internal carotid artery stenosis.   DISCHARGE DIAGNOSIS:  1.  Severe left internal carotid artery stenosis status post left carotid      endarterectomy with primary closure on May 24, 2004, by Dr. Madilyn Fireman of      [facility].  2.  History of coronary artery disease status post coronary artery bypass      grafting in 1995 with multiple subsequent percutaneous vein graft      interventions.  3.  Peripheral vascular disease status post left femoral to popliteal      bypass.  4.  Chronic obstructive pulmonary disease.  5.  Hypertension.  6.  Hyperlipidemia.  7.  Renal vascular hypertension status post bilateral renal artery stenting.  8.  History of tobacco abuse.  9.  History of extracranial cerebrovascular occlusive disease status post      right carotid endarterectomy on July 14, 2003.   HOSPITAL MANAGEMENT/PROCEDURES:  Left carotid endarterectomy with primary  closure completed by Dr. Madilyn Fireman on May 24, 2004.   CONSULTATIONS:  None.   HISTORY OF PRESENT ILLNESS:  Mr. Doverspike is a 76 year old male with known  carotid artery occlusive disease.  The patient underwent a right carotid  endarterectomy on June 17, 2003.  The patient was recently seen in follow  up by Dr. Samule Ohm in the [facility] Cardiology office.  The patient has been  free of any neurologic symptoms.  Specifically, he denied any sensory,  motor, or visual deficits.  Follow up carotid Doppler evaluation showed  severe left internal carotid artery stenosis in the 80-99% range.  The right  carotid endarterectomy site showed no  evidence of restenosis.  The patient  was then referred to Dr. Liliane Bade of [facility] for surgical consideration.  The  patient was seen and examined by Dr. Madilyn Fireman on May 16, 2004.  Dr. Madilyn Fireman  felt the patient would benefit from elective left carotid endarterectomy for  reduction of stroke risk.  The risks, benefits, and alternatives of the  procedure were discussed with the patient and his family at that time.  The  patient voiced understanding and agreed to proceed with surgery.  The  patient was to discontinue his Plavix and the surgery was scheduled for  May 24, 2004, at [facility].   HOSPITAL COURSE:  Mr. Smitherman was admitted electively to [facility]  on May 24, 2004.  The patient was taken to the operating room and  underwent left carotid endarterectomy with primary closure completed by Dr.  Madilyn Fireman of [facility].  Overall, the patient tolerated the procedure well.  He was  extubated on the operating table.  The patient  was then transferred to the  recovery room in stable condition.  The patient awoke from anesthesia  without any neurologic deficit.  Once awake, alert, and appropriate, the  patient was then transferred to 3300 for further management.  The patient's  postoperative course was relatively uneventful and without any  complications.  By postop day one, the patient was feeling quite well.  He  was ambulating independently in his room.  He was tolerating a regular diet.  He had no chest pain or shortness of breath.  The patient remained afebrile.  His vital signs were stable.  The patient had voided spontaneously after  Foley catheter was removed.  On physical exam, his heart was in a regular  rate and rhythm reading normal sinus rhythm on telemetry.  His lung sounds  were slightly diminished at the right base, otherwise, were clear.  His  abdomen was soft, nontender, nondistended, with good bowel sounds.  His  extremities were without edema and he had 2+  posterior tibial pulses  bilaterally.  The patient's left neck incision was clean, dry, and intact  without hematoma, erythema, or drainage.  The patient was neurologically  intact without any gross deficit.  The patient was deemed appropriate for  discharge on May 25, 2004.   DISPOSITION:  Mr. Mcgrady was discharged to home in improved and stable  condition on May 25, 2004.   DISCHARGE MEDICATIONS:  1.  Aspirin 325 mg daily.  2.  Isosorbide Mononitrate 60 mg daily.  3.  Toprol XL 100 mg daily.  4.  Norvasc 10 mg daily.  5.  Nexium 40 mg daily.  6.  Tylox 1-2 tablets every 4-6 hours as needed for pain.   DISCHARGE INSTRUCTIONS:  1.  Activities:  The patient is to avoid driving.  He should avoid heavy      lifting or strenuous activity.  He is to continue to walk daily.  2.  Diet:  The patient is to follow a low fat, low salt, heart healthy diet.  3.  Wound care:  The patient may shower.  He should wash his incisions daily      with soap and water.  He should notify the [facility] office if he has any      redness, swelling, or drainage from his incisions or if he has a fever      of greater than 101.   FOLLOW UP:  The patient is scheduled to see Dr. Madilyn Fireman on Monday, October 24  at 9:50 a.m.  Otherwise, the patient is instructed to notify the [facility] office  if he has any questions or concerns in the meantime.       CAF/MEDQ  D:  08/04/2004  T:  08/05/2004  Job:  [id number]   cc:   Salvadore Farber, M.D. [facility]  [address]  [address]  [place]  [place] [address]   Corwin Levins, M.D. [facility]

## 2010-12-30 NOTE — Op Note (Signed)
NAME:  [name], [name]                             ACCOUNT NO.:  [id number]   MEDICAL RECORD NO.:  [id number]                   PATIENT TYPE:  INP   LOCATION:  3315                                 FACILITY:  [facility]   PHYSICIAN:  Balinda Quails, M.D.                 DATE OF BIRTH:  07/28/1935   DATE OF PROCEDURE:  07/14/2003  DATE OF DISCHARGE:                                 OPERATIVE REPORT   PREOPERATIVE DIAGNOSIS:  Severe right internal carotid artery stenosis.   POSTOPERATIVE DIAGNOSIS:  Severe right internal carotid artery stenosis.   OPERATION PERFORMED:  Right carotid endarterectomy with Dacron patch  angioplasty.   SURGEON:  Balinda Quails, M.D.   ASSISTANT:  Jerold Coombe, P.A.   ANESTHESIA:  General endotracheal.   ANESTHESIOLOGIST:  Kaylyn Layer. Michelle Piper, M.D.   INDICATIONS FOR PROCEDURE:  [name] is a 76 year old male referred for  evaluation of extracranial cerebrovascular occlusive disease.  He has a  severe stenosis of the right internal carotid artery.  He does also have  posterior circulation disease with right vertebral artery stenosis.  He is  brought to the operating room at this time for right carotid endarterectomy  for reduction of stroke risks.  The risks of this operative procedure were  explained to the patient in detail.  Major morbidity and mortality  associated with this procedure 1 to 2% to include but not limited to MI, CVA  and death.   DESCRIPTION OF PROCEDURE:  The patient was brought to the operating room in  stable condition.  Placed in supine position.  General endotracheal  anesthesia induced.  The right neck prepped and draped in the sterile  fashion.  Foley catheter and arterial line in place.  A curvilinear skin  incision was made along the anterior border of the right sternomastoid  muscle.  Subcutaneous tissue and platysma divided with electrocautery.  Deep  dissection carried down through the subcutaneous tissue to the carotid  sheath.   Dissection carried anterior to the sternomastoid muscle.  The  common carotid artery mobilized and encircled with a vessel loop at the  omohyoid muscle.  The dissection then carried distally up to the bifurcation  where the external carotid and superior thyroid were encircled with vessel  loops.  The internal carotid artery followed distally up to the posterior  belly of the digastric muscle and encircled with a vessel loop.  The  internal carotid artery was of good caliber.  There was a large amount of  plaque at the carotid bifurcation.  The patient was administered 7000 units  heparin intravenously.  Adequate circulation time permitted.  Carotid  vessels controlled with clamps.  Longitudinal arteriotomy made in the distal  common carotid artery.  The arteriotomy extended across the carotid bulb and  up to the internal carotid artery.  There was a complex calcified plaque at  the  origin of the right internal carotid artery with a high grade stenosis.  The shunt was then inserted.  An endarterectomy elevator used to remove the  plaque.  The endarterectomy carried down into the common carotid artery  where the plaque was divided transversely with Potts scissors.  The plaque  then raised up into the bulb.  The superior and external carotid were  endarterectomized using an eversion technique.  The internal carotid artery  plaque feathered out well distally.  Fragments of plaque were removed with  plaque forceps.  The site irrigated with heparin saline solution and Dextran  solution.  A Finesse Dacron patch was then placed over the endarterectomy  site using running 6-0 Prolene suture.  At completion of patch angioplasty,  the shunt was removed.  All vessels were flushed.  Clamps were removed  directing initial antegrade flow up the external carotid artery.  Following  this, the internal carotid artery was released.  There was an excellent  pulse and Doppler signal in the distal internal  carotid artery.  The patient  was administered 50 mg of Protamine intravenously.  Adequate hemostasis  obtained.  Sponge and instrument counts were correct.  The sternomastoid  fascia was then closed with running 2-0 Vicryl suture.  The platysma closed  with running 3-0 Vicryl suture.  Skin closed with 4-0 Monocryl.  Steri-  Strips applied.  Sterile dressing applied.  Anesthesia reversed in the  operating room.  The patient awakened readily.  Transferred to the recovery  room in stable condition neurologically intact.  There were no apparent  complications.                                               Balinda Quails, M.D.    PGH/MEDQ  D:  07/15/2003  T:  07/15/2003  Job:  [id number]   cc:   [name], M.D.

## 2010-12-30 NOTE — H&P (Signed)
NAME:  [name], [name]                             ACCOUNT NO.:  [id number]   MEDICAL RECORD NO.:  [id number]                   PATIENT TYPE:  EMS   LOCATION:  MAJO                                 FACILITY:  [facility]   PHYSICIAN:  Keshena Bing, M.D.               DATE OF BIRTH:  1935-03-29   DATE OF ADMISSION:  02/10/2003  DATE OF DISCHARGE:                                HISTORY & PHYSICAL   REFERRING PHYSICIAN:   PRIMARY CARDIOLOGIST:  Dr. [name].   HPI:  [name] is a 76 year old gentleman with prior CABG surgery, who  presented to the emergency department with unstable angina.  His cardiac  history dates back to 1995 when he underwent uncomplicated CABG surgery.  He  subsequently returned approximately one year later with recurrent symptoms.  Repeat catheterization demonstrated patent bypass grafts but a 90% stenosis  in an unprotected circumflex.  It was elected to treat this medically.  He  was also found to have peripheral vascular disease, including 90% bilateral  renal artery stenoses and decreased ankle-brachial indices, most prominent  on the left, with a history of hypertension, hyperlipidemia, and cigarette  smoking, which has continued.  [name] did well symptomatically until  recent weeks when he had episodic, sharp chest pain, generally at rest.  This has promptly been relieved by nitroglycerin.  He was evaluated by Dr.  [name] five days ago in the office, at which time he was told to discontinue  Warfarin in preparation for an angiogram, presumably of the renal artery,  with hopes to observe renal function.  [name] had a typical episode of  sharp, mid-substernal chest discomfort at rest this evening that responded  promptly to sublingual nitroglycerin.  There was no radiation and no  associated symptoms.  When he a recurrent episode within a short period of  time, EMS was summoned.  The initial rhythm strip in their records  demonstrates inferior Q-waves with  inferior ST segment elevation.  A  subsequent 12-lead electrocardiogram shows resolution of ST segment  elevation but very prominent anterolateral ST segment depression with T-wave  inversion.  In yet another tracing, the ST segments have improved.  By the  time of arrival in the emergency room, there was further resolution of his  ST-T abnormalities.   PAST MEDICAL HISTORY:  Otherwise notable for a bypass procedure performed on  the left leg approximately four years ago.  This has resulted in resolution  of claudication.  The patient now notes easy fatigability of his legs but no  pain with exercise.   Recent medications have included nitroglycerin spray, Warfarin, Clopidogrel,  metoprolol, and Imdur.  The patient has no known allergies.   SOCIAL HISTORY:  Previously worked as a Music therapist; now retired.  Married and  lives with his wife.  Continues to smoke cigarettes.   FAMILY HISTORY:  Positive for coronary disease.  REVIEW OF SYSTEMS:  Negative except as noted above.   PHYSICAL EXAMINATION:  GENERAL:  A pleasant gentleman in no acute distress.  VITAL SIGNS:  Blood pressure 160/90; heart rate 80 and regular; respirations  18; temperature 98.7.  HEENT:  Anicteric sclerae.  NECK:  Prominent right carotid bruit.  ENDOCRINE:  No thyromegaly.  HEMATOPOIETIC:  No adenopathy.  SKIN:  No significant lesions.  LUNGS:  Clear.  CARDIAC:  Normal first and second heart sounds.  Fourth heart sound present.  ABDOMEN:  Soft and nontender; no bruit; no organomegaly.  EXTREMITIES:  No edema; peripheral pulses decreased at present.  NEUROMUSCULAR:  Symmetric strength and tone.   INITIAL LABORATORY STUDIES:  Include a potassium of 3.6, BUN of 22, and  creatinine of 1.7.   IMPRESSION:  [name] has wide-spread vascular disease and presents with  clear, unstable angina.  He has responded well to initial medical therapy.  Serial cardiac markers and EKGs will be obtained.  He was scheduled to  be  admitted to the hospital later this morning for angiography, presumably  renal angiography.  I have explained the need for attention to his heart and  the desirability of performing a coronary angiogram.  The benefits and risks  were explained including possible permanent renal failure requiring life-  long dialysis.  The patient agrees to proceed.  If he has recurrent symptoms  after initial treatment with beta-blocker, aspirin, intravenous heparin and  intravenous nitroglycerin, coronary angiography will be performed urgently.   [name] has a problem carotid bruit and will require non-invasive carotid  studies.  Atherosclerosis of the lower extremities appears stable.  Our  interventionalist will determine the appropriate time to direct attention to  his bilateral renal artery disease.  Statin therapy will be added for  hyperlipidemia and vascular disease.                                               Viola Bing, M.D.    RR/MEDQ  D:  02/10/2003  T:  02/10/2003  Job:  [id number]

## 2010-12-30 NOTE — Discharge Summary (Signed)
NAMEKYO, COCUZZA                             ACCOUNT NO.:  [id number]   MEDICAL RECORD NO.:  [id number]                   PATIENT TYPE:  INP   LOCATION:  6531                                 FACILITY:  [facility]   PHYSICIAN:  [name], M.D.                   DATE OF BIRTH:  06/05/1935   DATE OF ADMISSION:  07/20/2003  DATE OF DISCHARGE:  07/22/2003                                 DISCHARGE SUMMARY   DISCHARGE DIAGNOSES:  1. Unstable angina.     A. Treated with percutaneous transluminal coronary angioplasty to the        saphenous vein graft to the right coronary artery secondary to in-        stent restenosis.  2. Coronary artery disease.     A. Status post coronary artery bypass graft in 1995.     B. Catheterization this admission; vein graft to the right coronary        artery 95% mid in-stent restenosis, vein graft to obtuse marginal with        50% stenosis, left internal mammary artery to left anterior descending        patent, vein graft to the first diagonal with 50% stenosis, left main        coronary artery with 80% stenosis.     C. Status post percutaneous coronary intervention to the vein graft to        the obtuse marginal on February 13, 2003.  3. Ejection fraction 55 to 65% by echocardiogram done in June of 2004.  4. Moderate mitral regurgitation by echocardiogram in June of 2004.  5. Peripheral vascular disease.     A. Status post left femoral to popliteal bypass.     B. Status post thrombectomy.     C. Status post right carotid endarterectomy on July 14, 2003.     D. Bilateral renal artery stenosis - status post stenting.  6. Dyslipidemia.  7. Chronic obstructive pulmonary disease.  8. Hypertension.  9. History of hiatal hernia.  10.      History of contrast-induced nephropathy.   PROCEDURE:  Cardiac catheterization, percutaneous coronary intervention by  [name], M.D. on July 21, 2003.  Please see his dictated note for  complete details.  As noted  above, status post PTCA to the vein graft to the  RCA secondary to in-stent restenosis with reduction in stenosis to less than  10%.   HOSPITAL COURSE:  Please see the admission history and physical by Lorain Childes, M.D. [facility] for complete details.  Briefly, this 76 year old male  presented to the emergency room with complaints of chest discomfort.  He had  relief with nitroglycerin spray.  Originally, his discomfort was felt to be  different from his previous chest pain with angina.  Initial enzymes were  unremarkable.  He had no exertional symptoms.  The original plan  was to  proceed with Cardiolite testing, however, another troponin came back just  mildly elevated at 0.05.  We therefore, decided to go ahead and proceed with  cardiac catheterization.  This was performed by Dr. [name] on July 21, 2003.  The results are noted above.  Please see his dictated note for  complete details.  Dr. [name] proceeded to perform PTCA on the vein graft to  the RCA, reducing in-stent restenosis from 95 to less than 10%.  It was  noted that the patient also had 80% left main coronary artery disease.  Dr.  [name] noted that if the patient had recurrent symptoms, then consideration  could be made for PCI of the left main coronary artery.  No left  ventriculogram was done.  There were some problems controlling the patient's  blood pressure after the catheterization.  This was eventually controlled.  On the morning of July 22, 2003, the patient was found to be in stable  condition.  He was ambulating without any chest pain.  His right groin was  without hematoma.  He had noted bruit, but this was present prior to his  catheterization.   At discharge, the patient's potassium was noted to be 3.5.  This was felt to  be borderline low and he was given potassium supplementation prior to  discharge.  His lipid panel also returned revealing HDL of 35 and LDL of 88,  total cholesterol 146, and  triglycerides 114.  His Crestor was increased  from 10 to 20 mg daily and he would need follow-up LFT's and lipids in about  6 to 8 weeks.  In order to better help his blood pressure, his Toprol was  increased to 75 mg daily.  Given his history of contrast-induced nephropathy  in the past and catheterization this admission, he will need follow-up BMET  at his appointment in two weeks.   Of note, the patient recently underwent right carotid endarterectomy on  July 14, 2003.  He was recovering from this without any problems when he  developed his presenting symptoms.   LABORATORY DATA:  White count 6900, hemoglobin 12, hematocrit 35.3, platelet  count 172,000.  Sodium 138, potassium 3.5, chloride 104, CO2 27, glucose 97,  BUN 8, creatinine 1.0, calcium 8.9.  Total bilirubin 0.5, alkaline  phosphatase 143, AST 18, ALT 10, total protein 6.2, albumin 3.2, hemoglobin  A1C 6.1.  Cardiac enzymes as noted above.  Follow-up troponins were  negative.  CK-MB's were completely negative throughout the admission. Lipid  panel as noted above.  TSH 1.618.   Admission chest x-ray; stable appearance post coronary artery bypass  grafting.   DISCHARGE MEDICATIONS:  1. Enteric-coated aspirin 325 mg daily.  2. Nitroglycerin as needed for chest pain.  3. Toprol 50 mg 1-1/2 tablets daily.  4. Plavix 75 mg daily.  5. Crestor 20 mg daily.  6. Protonix 40 mg daily.  7. Clonidine 0.1 mg twice daily.  8. Pletal 100 mg twice daily.  9. IMDUR 60 mg daily.  10.      Tylenol as needed.  11.      Nitroglycerin as needed for chest pain.   FOLLOW UP:  He is to call our office or 911 for recurrent chest pain.  The  patient is to follow up with physician's assistant for Dr. [name] on December  22, at 11 a.m.  As noted above, he will need a BMET checked at that time. He  will also need to be set  up for fasting lipids and LFT's in 6 to 8 weeks. The patient will need to remain on aspirin and Plavix.  The patient  should  follow up with Corwin Levins, M.D. [facility] as needed.   ACTIVITY:  No driving, heavy lifting, exertion, sex, or work for three days.   DIET:  Low fat, and low sodium.   WOUND CARE:  The patient is to call our office in [facility] for any groin  swelling, bleeding, or bruising.      Tereso Newcomer, P.A.                        [name], M.D.    SW/MEDQ  D:  07/22/2003  T:  07/22/2003  Job:  [id number]   cc:   Corwin Levins, M.D. [facility]

## 2010-12-30 NOTE — H&P (Signed)
NAMEFARD, BORUNDA NO.:  [id number]   MEDICAL RECORD NO.:  [id number]          PATIENT TYPE:  INP   LOCATION:  1826                         FACILITY:  [facility]   PHYSICIAN:  Vida Roller, M.D.   DATE OF BIRTH:  1935/01/07   DATE OF ADMISSION:  05/26/2004  DATE OF DISCHARGE:                                HISTORY & PHYSICAL   PRIMARY CARE PHYSICIAN:  Corwin Levins, M.D.   CARDIOLOGIST:  Salvadore Farber, M.D.   HISTORY OF PRESENT ILLNESS:  Mr. Jacome is a 76 year old man with a history  of tobacco abuse and known coronary artery disease status post bypass  surgery in 1995, who presents with the onset of nausea and vomiting which  awakened him in the middle of the night, associated with discomfort and  shortness of breath.  When see in the emergency department he had ST segment  depression with chest discomfort which resolved with intravenous  nitroglycerin.  He is currently pain free.   PAST MEDICAL HISTORY:  1.  Significant for hypertension.  2.  Hyperlipidemia.  3.  Peripheral vascular disease status post revascularization of his lower      extremities as well as his renal arteries and his carotids.  He is most      recently status post a left carotid endarterectomy 2 days ago.  4.  Chronic pulmonary obstructive disease.  5.  Renal artery stenosis status post stenting.  6.  A history of a contrast nephropathy.  7.  He is status post coronary artery bypass surgery in 1995 where he has a      LIMA to his LAD, a diagonal saphenous vein graft to a obtuse marginal,      and a saphenous vein graft to his right coronary.  He is also status      post percutaneous revascularization of his saphenous vein graft to his      obtuse marginal with a Cypher and a Taxus stent. He has a history of      percutaneous coronary intervention to a saphenous vein graft and to his      right for in-stent restenosis.  His last evaluation for left ventricular      function was an  echocardiogram which was performed in June of 2004 that      showed an ejection fraction of 55-65% with moderate mitral      regurgitation.   SOCIAL HISTORY:  He lives in [place] with his wife, he is retired.  He  has a greater than 60 pack year smoking history and continues to smoke.  He  denies any alcohol use.   ALLERGIES:  No known allergies.   MEDICATIONS:  1.  Clonidine 01. mg b.i.d.  2.  Crestor 40 mg once daily.  3.  Imdur 60 mg once daily.  4.  Toprol XL 100 mg once daily.  5.  Plavix 75 mg once daily.  6.  Norvasc 10 mg once daily.  7.  Aspirin 325 mg once daily.  8.  Multivitamin once daily.   FAMILY HISTORY:  His mother died at age 78 of cancer. Father died at age 48  of coronary artery disease. A sister died of renal disease.   REVIEW OF SYSTEMS:  Essentially negative except for that reviewed in the  history of present illness.   PHYSICAL EXAMINATION:  GENERAL:  He is an elderly white male currently in no  apparent distress, alert and oriented x4.  VITAL SIGNS:  His blood pressure is 160/70, heart rate is 92 in sinus,  respiratory rate is 14.  HEENT:  Head, ears, eyes and nose unremarkable.  NECK:  Supple.  He has a well-healed scar on the right carotid and there is  a recent scar with Steri-strips on the left, there is no evidence of  hematoma, there are no bruits noted.  CHEST:  Decreased breath sounds bilaterally at the bases but no rales.  CARDIOVASCULAR:  He has a regular rate and rhythm with normal first and  second heart sounds, there is an S4.  He does have a 2/6 ejection-type  murmur at the apex. Distal pulses are 2+ in his radial, he has diminished  pulses with bilateral femoral bruits in his legs.  ABDOMEN:  Soft, nontender.  EXTREMITIES:  No clubbing, cyanosis or edema.   His electrocardiogram shows sinus tachycardia, rate of 105 with normal PR  interval at 180 milliseconds, QRS duration at 113 milliseconds. QT interval  of 496 milliseconds.  He  has left axis deviation -57 which is similar to his  previous EKG from May 20, 2004, but what he does have is significant ST  segment depression in the anterolateral leads V4, 5 and 6, about 2mm; this  is more marked from his previous EKG.   LABORATORY DATA:  White blood cell count 10,000, H&H of 14 and 39 with a  platelet count of 166,000.  PT 11.3, PTT 27, INR 0.8.  Point care enzymes x1 are negative.  Sodium 134,  potassium 3.4, chloride 102, bicarb 27, BUN 11, creatinine 1.1 and his blood  sugar is 151. Liver function studies are within normal limits.  Chest x-ray  is pending.   IMPRESSION:  This is a gentleman with known severe coronary artery disease,  ongoing tobacco abuse, and recent carotid endarterectomy who has severe  peripheral vascular disease, hyperlipidemia and hypertension.   PLAN:  Admit him, cycle his enzymes.  Will use intravenous heparin. The  surgeons have asked that we not bolus him but just use and infusion.  He  needs IV beta blockers and IV nitroglycerin as well as aspirin.  They have  asked [place] not to use inhibitors but we can put him back on his Plavix and  will get a heart catheterization on him later on this morning.      Trey Paula   JH/MEDQ  D:  05/26/2004  T:  05/26/2004  Job:  [id number]   cc:   Corwin Levins, M.D. [facility]   Salvadore Farber, M.D. [facility]  [address]  [address]  [place]  [place] [address]

## 2010-12-30 NOTE — Discharge Summary (Signed)
[name], [name]                   ACCOUNT NO.:  [id number]   MEDICAL RECORD NO.:  [id number]          PATIENT TYPE:  INP   LOCATION:  2024                         FACILITY:  [facility]   PHYSICIAN:  Balinda Quails, M.D.    DATE OF BIRTH:  1935-01-03   DATE OF ADMISSION:  10/23/2005  DATE OF DISCHARGE:  10/25/2005                                 DISCHARGE SUMMARY   ADMISSION DIAGNOSIS:  Occluded left femoral popliteal bypass.   DISCHARGE DIAGNOSES:  1.  Occluded left femoral popliteal bypass.  2.  Hypertension.  3.  Hyperlipidemia.  4.  Arterial vascular disease.   CONSULTATIONS:  None.   PROCEDURE:  On October 23, 2005, the patient underwent a re-do left femoral  popliteal bypass with 6 mm Gore-Tex graft by Dr. Balinda Quails.   HISTORY OF PRESENT ILLNESS:  [name] is a 76 year old male who was  admitted to the hospital with acute dehydration and acute renal failure. At  that time, he was found to have an occluded left femoral popliteal Gore-Tex  bypass graft. His left leg was ___________. The patient underwent  ___________ and with improvement in his renal function, he was brought to  the operating room for exploration of occluded left femoral popliteal graft.   HOSPITAL COURSE:  Postoperatively, the patient did well and progressed as  expected. On postoperative day 1, the patient's vital signs are stable. Labs  are within normal limits. The patient heart remained regular and her lungs  were clear. The patient has positive Doppler of posterior tibial and  perineal on the right. The patient was to ambulate and continue his  incentive spirometry. His Foley was discontinued. Postoperatively, the  patient's creatinine remained stable. On postoperative day 2, the patient's  vital again remained stable. His extremities were warm bilaterally and his  left lower extremity was well perfused. The patient was discharged home on  postoperative day 2, as he was ambulating well. While in the  hospital,  cardiac rehabilitation did follow the patient and ambulated with him. On  postoperative day 2, the patient did have some hypotensive with blood  pressure of 82/30 and 94/40. After the patient walked, his blood pressure  increased to 98/42. The patient denied any dizziness or light headedness  before, during, or after the walk. The patient's Toprol was held and he was  to continue taking his medication the next day. The patient was given some  normal saline bolus and as he was asymptomatic, he was discharged home.   PHYSICAL EXAMINATION:  HEART:  Regular rate and rhythm.  LUNGS:  Clear to auscultation bilaterally.  EXTREMITIES:  Incisions clean, dry, and intact. Positive Doppler on the left  side. Posterior tibial and peroneal pulses positive on the right.  VITAL SIGNS:  The patient is afebrile and vital signs are stable.   LABORATORY DATA:  Showed a WBC 9, hemoglobin 9.1, hematocrit 26.3, and  platelet count of 199,000. The patient had a BNP, which showed a sodium of  138, potassium of 4, chloride 104, CO2 29, BUN 13, creatinine 0.2,  and  glucose 116.   CONDITION ON DISCHARGE:  Good.   DISPOSITION:  The patient will be discharged to home.   DISCHARGE MEDICATIONS:  1.  Tylox 1 to 2 tablet q.6 hours p.r.n.  2.  Norvasc 10 mg p.o. daily.  3.  Clonidine 0.1 mg p.o. b.i.d.  4.  Isosorbide 60 mg p.o. daily.  5.  Crestor 40 mg p.o. daily.  6.  Plavix 75 mg p.o. daily.  7.  Ecotrin 325 mg p.o. daily.  8.  Toprol XL 100 mg p.o. daily.  9.  Lisinopril 20 mg p.o. daily.  10. Pletal 100 mg p.o. b.i.d.   DIET:  The patient was instructed to follow a low-fat, low-sale diet.   ACTIVITY:  He was to do no driving or heavy lifting greater than 10 pounds  for 3 weeks. The patient is to ambulate 3 to 4 times daily and increase  activity as tolerated. The patient may shower and clean his wounds with  light gentle soap and water.   FOLLOW UP:  1.  The patient was instructed to  followup ___________ incision drainage,      erythema, or temperature greater than 101.5.  2.  The patient has a followup appointment with Dr. Madilyn Fireman on November 09, 2005      in the morning.      Constance Holster, PA      P. Liliane Bade, M.D.  Electronically Signed    JMW/MEDQ  D:  12/06/2005  T:  12/07/2005  Job:  [id number]   cc:   Balinda Quails, M.D.  [address]  [place]  [place] [address]

## 2010-12-30 NOTE — H&P (Signed)
[name], [name]                             ACCOUNT NO.:  [id number]   MEDICAL RECORD NO.:  [id number]                   PATIENT TYPE:  INP   LOCATION:  6742                                 FACILITY:  [facility]   PHYSICIAN:  Corwith Bing, M.D.               DATE OF BIRTH:  1935/04/23   DATE OF ADMISSION:  02/25/2003  DATE OF DISCHARGE:                                HISTORY & PHYSICAL   PRIMARY CARDIOLOGIST:  [name], M.D.   REFERRING PHYSICIAN:  Dr. Sabino Gasser.   HISTORY OF PRESENT ILLNESS:  A 76 year old gentleman with diffuse vascular  disease and recent percutaneous coronary intervention, admitted following an  office visit yesterday, at which time acute renal failure was diagnosed.  [name] has known renal artery stenosis, for which percutaneous  intervention has been planned. He presents with chest pain approximately 2  weeks ago and was found to have stenosis of 2 vein grafts. Stents were  placed in each of these.  His hospital course was complicated by anemia with  occult blood detected in his stool.  Upper endoscopy was notable for  duodenitis and H. pylori positivity.  Appropriate antibiotics were  initiated.   [name] was previously maintained on warfarin for his peripheral vascular  disease, but this was discontinued after he was found to have upper GI  bleeding; and has continued to be held due to planned colonoscopy.  There  was also a history of significant cerebrovascular disease, 80% stenosis of  the right internal carotid and 60-80% on the left.   The patient's CABG surgery was performed in 1995.  A recent echocardiogram  demonstrated normal LV-systolic function with substantial mitral  regurgitation.   [name] had felt relatively well since hospital discharge approximately 1  week ago.  He has had some mild nausea, but does not think this has  substantially affected his p.o. intake.   CURRENT MEDICATIONS:  1. Diltiazem 240 mg daily.  2.  Isosorbide mononitrate 60 mg daily.  3. Simvastatin 10 mg daily.  4. Clopidogrel 75 mg daily.  5. Aspirin 81 mg daily.  6. Niferex 150 mg daily.  7. Metoprolol 50 mg daily.  8. Losartan 50 mg b.i.d.  9. HCTZ 12.5 mg daily.  10.      Biaxin 500 mg b.i.d.  11.      Flagyl 500 mg b.i.d.   PAST MEDICAL HISTORY:  Otherwise notable for history of tobacco-induced  COPD.  The patient has had mild chronic renal insufficiency.  Creatinine  prior to angiography was 1.8.   SOCIAL HISTORY:  Retired; lives in [place]; extensive tobacco history -  recently quit.  Denies significant use of alcohol.   FAMILY HISTORY:  Negative for coronary disease.   REVIEW OF SYSTEMS:  Recent mild weight loss; all other systems negative.   PHYSICAL EXAMINATION:  VITAL SIGNS:  Blood pressure 150/60, heart rate  55  and regular, respirations 16.  GENERAL:  A pleasant, trim gentleman in no acute distress.  HEENT:  Mild ptosis on the left.  SKIN:  No significant lesions.  NECK:  No jugulovenous distention; harsh bilateral carotid bruits.  ENDOCRINE:  No thyromegaly.  HEMATOPOIETIC:  No adenopathy.  LUNGS:  Somewhat decreased breath sounds at the bases; mild prolongation of  the expiratory phase.  CARDIOVASCULAR:  Distant first and second heart sounds; grade 2/6 systolic  murmur.  ABDOMEN:  No bruits; soft and nontender; no organomegaly.  EXTREMITIES:  Distal pulses weak to absent; feet are warm bilaterally; no  edema.  NEUROLOGIC:  Normal strength and tone.   EKG and laboratory are pending. A rhythm strip demonstrated a sinus pause of  1.5 seconds with transient mild bradycardia.  Creatinine was reportedly 4  when assessed in the office, which is what prompted this admission.   IMPRESSION:  [name] presents with widespread vascular disease, and acute  on chronic renal insufficiency.  The most likely etiology is an adverse  reaction to the dye load he received 10 days ago.  There is the possibility  that  he is somewhat dehydrated from adverse GI effects of his medications.  We will proceed with some hydration overnight.  Biaxin, Flagyl and iron will  be held overnight.  His ARB will be discontinued due to the presence of  renal insufficiency.  His diuretic is ineffective in these circumstances and  will also be discontinued.  He has had some bradycardia, but it is  asymptomatic and mild. We will not reduce his dose of metoprolol at present.  Creatinine will be reassessed in the morning. If improved, the patient can  be discharged for further outpatient follow up.  If worse, renal  consultation will be obtained.                                               [place] Bing, M.D.    RR/MEDQ  D:  02/25/2003  T:  02/25/2003  Job:  [id number]

## 2010-12-30 NOTE — Consult Note (Signed)
NAME:  [name], [name] NO.:  [id number]   MEDICAL RECORD NO.:  [id number]                   PATIENT TYPE:  EMS   LOCATION:  MAJO                                 FACILITY:  [facility]   PHYSICIAN:  Willa Rough, M.D.                  DATE OF BIRTH:  October 03, 1934   DATE OF CONSULTATION:  02/08/2004  DATE OF DISCHARGE:                                   CONSULTATION   PRIMARY CARE PHYSICIAN:  Dr. [name].   PRIMARY CARDIOLOGIST:  Primary cardiologist was Dr. [name], being transferred  to Dr. Samule Ohm.   CHIEF COMPLAINT:  Nausea/abdominal pain.   HISTORY OF PRESENT ILLNESS:  [name] is a 76 year old male with a history  of coronary artery disease. He has been wakened in the middle of the night  by nausea for about a month. His symptoms last 3 to 45 minutes. He sites up  in a chair for a while, belches several times, and the symptoms are  relieved. Last p.m., the same symptoms woke him at approximately 12:30 a.m.  There was no vomiting, and he denies chest pain. He had no shortness of  breath or diaphoresis, merely the nausea, but he was extremely  uncomfortable. The wife called EMS, and he came to the emergency room. In  route, he was given 4 baby aspirins which were no help. He received no  further medications, but in the emergency room, he stated he belched several  times and sat up for a while, and he is asymptomatic now. In general, he  states he is active around the house without symptoms. Of note, he ate two  hot dogs with chili last night and states that he only drinks two large cups  of black coffee, one with supper and one after supper. He also frequently  snacks right before bedtime.   PAST MEDICAL HISTORY:  1. Significant for hypertension.  2. Hyperlipidemia.  3. Peripheral vascular disease.  4. Chronic obstructive pulmonary disease.  5. Bilateral renal artery stenosis status post stenting.  6. History of contrast nephropathy.  7. He also has  a history of hiatal hernia.  8. He is status post aortocoronary bypass surgery in 1995 with LIMA to LAD,     SVG to diagonal, SVG to OM, and SVG to RCA.  9. Status post PCI of the SVG to OM with a Cypher stent and Taxus stent.  10.      Status post PTCA of the SVG to RCA for end-stent restenosis.  11.      History of preserved left ventricular function with an EF of 55 to     65% by echocardiogram in June of 2004.  12.      Moderate MR by echocardiogram June of 2004.  13.      Status post left FEM/POP with subsequent thrombectomy.  14.  Status post right CEA.   SOCIAL HISTORY:  He lives in [place] with his wife and is retired. He has  a greater than 60-pack-year history of tobacco use but has quit, and he has  a history of ETOH abuse but quit greater than 10 years ago.   ALLERGIES:  No known drug allergies.   MEDICATIONS:  1. Clonidine 0.1 mg b.i.d.  2. Crestor 40 mg a day.  3. Imdur 60 mg q.d.  4. Toprol-XL 100 mg q.d.  5. Plavix 75 mg q.d.  6. Norvasc 10 mg q.d.  7. Aspirin 325 mg q.d.  8. Multivitamin q.d.  9. __________ p.o. b.i.d.   FAMILY HISTORY:  His mother died at age 33 of cancer. His father died at age  41 of coronary artery disease and a sister died with kidney disease.   REVIEW OF SYSTEMS:  Significant for recent reddened eye but no vision lost.  He has reflux symptoms as described above. He has arthralgias occasionally.  Review of systems is otherwise negative.   PHYSICAL EXAMINATION:  VITAL SIGNS:  Temperature is 97.1, blood pressure  141/67, pulse 80, respiratory rate 22, O2 saturation 94% on room air.  GENERAL:  He is a well-developed elderly white male in no acute distress.  HEENT:  His head is normocephalic, atraumatic with pupils equal, round, and  reactive to light and accommodation. His left sclera is reddened but not  injected. Nares are without discharge.  NECK:  Fairly supple and without lymphadenopathy, thyromegaly, or JVD but  bilateral  bruits are appreciated, and there is a CEA scar on the right.  CARDIOVASCULAR:  His heart is regular in rate and rhythm with a S1, S2, and  S4 and a 1 or 2/6 systolic ejection murmur at the apex. His distal pulses  are 2+ for the radial and the left DP, but the right DP is slightly  diminished. Bilateral femoral bruits are appreciated.  LUNGS:  He has a few rales at the bases but no wheezing is noted.  ABDOMEN:  Soft and nontender with active bowel sounds and no  hepatosplenomegaly is noted.  EXTREMITIES:  There is no clubbing, cyanosis, or edema noted. Capillary  refill is within normal limits in all 4 extremities.  MUSCULOSKELETAL:  There is no joint deformity or effusion. No spine or CVA  tenderness.  NEUROLOGICAL:  He is alert and oriented with cranial nerves II-XII grossly  intact.   STUDIES:  EKG is sinus rhythm, rate 73 with no acute ST changes. He has some  old lateral changes that are seen on previous EKG dated December of 2004.   LABORATORY DATA:  Point of care markers negative x2.   ASSESSMENT/PLAN:  1. Coronary artery disease; the patient has a followup appointment with Dr.     Samule Ohm on July 5, and he is to keep this. If he continues to have     symptoms, he may need a Cardiolite to rule out ischemia.  2. Gastroesophageal reflux disease symptoms. The patient has previously been     given prescriptions for Prevacid and Protonix but has not filled either     one of them secondary to cost. We determined that Protonix was the     cheaper of the two medications, and he patient was encouraged to get that     prescription filled. He was advised to take the medication daily with     supper. He was also advised that if he continued to snack after meals and  drink     large amounts of black coffee with and after supper that his symptoms     would probably continue. He was advised to make a followup appointment    with Dr. Jonny Ruiz for further evaluation.  3. The patient is  otherwise stable and will continue on some medications and     follow up as an outpatient.     Theodore Demark, P.A. [facility]                  Willa Rough, M.D.    RB/MEDQ  D:  02/08/2004  T:  02/08/2004  Job:  [id number]

## 2010-12-30 NOTE — Cardiovascular Report (Signed)
NAMEBETTY, [name]                             ACCOUNT NO.:  [id number]   MEDICAL RECORD NO.:  [id number]                   PATIENT TYPE:  INP   LOCATION:  4707                                 FACILITY:  [facility]   PHYSICIAN:  [name], M.D.                   DATE OF BIRTH:  1935/01/08   DATE OF PROCEDURE:  07/21/2003  DATE OF DISCHARGE:                              CARDIAC CATHETERIZATION   PROCEDURES PERFORMED:  1. Selective coronary angiography.  2. Selective angiography saphenous vein and internal mammary bypass grafts.  3. PTCA saphenous vein graft to the right coronary artery.   DIAGNOSES:  1. Native three vessel coronary artery disease.  2. Atherosclerosis saphenous vein grafts.   HISTORY:  [name] is a 76 year old white male with a history of coronary  artery bypass graft surgery who has recently undergone a percutaneous  intervention of saphenous vein graft to the right coronary artery as well as  saphenous vein graft to the left circumflex system.  He presents now with  recurrent chest discomfort that is unstable.  He was admitted to the  hospital, ruled out for acute myocardial infarction, and presents now for  further assessment.   TECHNIQUE:  Informed consent was obtained.  The patient brought to the  catheterization laboratory.  A 6-French sheath placed in the right femoral  artery using modified Seldinger technique.  A 6-French JL4 and JR4 catheter  was then used to engage the left and right coronary arteries.  Selective  angiography performed in various projections using manual injections of  contrast.  The JR4 catheter was then used to engage the three saphenous vein  and one internal mammary bypass grafts and again selective angiography was  performed using manual injections of contrast.  Left ventriculogram was not  performed to conserve contrast load.   FINDINGS:  1. Left main trunk:  Heavily calcified.  Severe diffuse disease of 80%.  2. LAD:  Medium  caliber vessel.  Provides two diagonal branches.  The LAD     has severe disease of 80% in the proximal segment.  Is then 100% occluded     in the mid section.  The distal vessel fills via a LIMA graft and has     mild disease of 30%.  First diagonal branch is a large vessel with ostial     narrowing of 70%.  Second diagonal branch is occluded proximally and     fills via saphenous vein graft.  Exhibits mild disease of 30%.  3. Left circumflex artery is a medium caliber vessel.  Provides several     small trivial marginal branches in proximal segment.  It is then occluded     in the mid section.  A large marginal branch fills via a saphenous vein     graft and exhibits mild disease of 30%.  4. Right coronary artery is dominant.  This is a medium caliber vessel.     Provides posterior descending artery, small posterior ventricular branch     in terminal segment.  The right coronary artery has an ostial narrowing     70%.  It is then 100% occluded after providing two RV marginal branches.     The PDA fills via saphenous vein graft and has mild diffuse disease of 30-     40%.  5. LIMA to LAD is patent with ostial narrowing of 30%.  6. Saphenous vein graft to the second diagonal branch of the LAD is patent     with mild disease of 50% in the distal section.  7. Saphenous vein graft to the marginal branch left circumflex artery is     patent.  There is a previously placed stent and the proximal to mid     section is widely patent.  In the proximal segment there is moderate     disease of 50%.  8. Saphenous vein graft to the PDA of the right coronary artery is patent.     There is a previously placed stent in the mid section with a high grade     restenosis of 95% in the mid section of the stent.   PCI:  With these findings we elected to proceed with percutaneous  intervention of the saphenous vein graft go the right coronary artery.  The  patient had been pretreated with aspirin and Plavix.   He was given heparin  systemically to maintain ACT of approximately 250 seconds.  A 6-French  multipurpose 2 guide catheter was used to engage the vein graft and a 0.014  injection port wire advanced distally.  A 3.0 x 15 mm Quantum Maverick  balloon was introduced and a total of two inflations performed at up to 16  atmospheres for 60 seconds within the mid section of the saphenous vein  graft.  Repeat angiography was then performed showing an excellent result  with less than 10% residual narrowing.  There was no vessel damage and TIMI  3 flow to the vein graft with improved distal blush.  This is deemed  acceptable result.  Guide catheter was then removed and sheath secured in  position.  The patient tolerated procedure well and was transferred to floor  in stable condition.  A total of 175 mL of contrast was used.   FINAL RESULTS:  Successful PTCA of the saphenous vein graft to the right  coronary artery with reduction of 95% in-stent restenosis to 10% using a 3.0  x 15 mm Quantum Maverick balloon.                                               [name], M.D.    [name]  D:  07/21/2003  T:  07/21/2003  Job:  [id number]   cc:   Corwin Levins, M.D. [facility]

## 2010-12-30 NOTE — Discharge Summary (Signed)
   NAME:  [name], [name]                             ACCOUNT NO.:  [id number]   MEDICAL RECORD NO.:  [id number]                   PATIENT TYPE:  INP   LOCATION:  2028                                 FACILITY:  [facility]   PHYSICIAN:  Gene Serpe, P.A. [facility]                DATE OF BIRTH:  1935-07-15   DATE OF ADMISSION:  06/01/2003  DATE OF DISCHARGE:                           DISCHARGE SUMMARY - REFERRING   ADDENDUM   DISCHARGE DIAGNOSES:  Mild hypokalemia.                                                Gene Serpe, P.A. [facility]    GS/MEDQ  D:  06/02/2003  T:  06/02/2003  Job:  [id number]

## 2010-12-30 NOTE — Discharge Summary (Signed)
NAME:  [name], [name]                             ACCOUNT NO.:  [id number]   MEDICAL RECORD NO.:  [id number]                   PATIENT TYPE:  INP   LOCATION:  2028                                 FACILITY:  [facility]   PHYSICIAN:  Willa Rough, M.D.                  DATE OF BIRTH:  [birth year]   DATE OF ADMISSION:  06/01/2003  DATE OF DISCHARGE:  06/02/2003                           DISCHARGE SUMMARY - REFERRING   PROCEDURE:  Cerebral angiogram on October 19.   REASON FOR ADMISSION:  Please refer to dictated admission note.   LABORATORY DATA:  WBC 8.7, hemoglobin 13.4, hematocrit 38.8, platelets 152  at admission. Sodium 138, potassium 3.4, glucose 131, BUN 11, creatinine  1.2.   Admission chest x-ray; cardiomegaly, diffusely increased interstitial  opacity, probably chronic.   HOSPITAL COURSE:  The patient was started on intravenous fluids on  admission, in preparation for scheduled cerebral angiogram, performed by  [name], M.D. the following day.  Angiogram notable for an 80% ulcerated  right ICA and ECA stenosis with mild left ICA disease.  Additionally, there  was a 90% ostial left vertebral artery and 70% ostial right vertebral artery  stenosis.  The intracranial vessels were also visualized and revealed only  mild disease.   Dr. [name] recommended continued monitoring for symptoms of dizziness, and if  symptoms persist, consider percutaneously angioplasty of the vertebrals.   The patient was cleared for discharge later the same evening.  Arrangements  will be made for him to return for office follow-up with Dr. [name] in the  ensuing four-six weeks.  Of note, the patient will also need a follow-up  metabolic profile for continued monitoring of his mild renal insufficiency.   DISCHARGE MEDICATIONS:  The patient was instructed to resume all previous  home medications.  Of note, the patient had recently been started on Pletal  100 mg b.i.d. and Toprol was increased to 100 mg  daily.   DISCHARGE INSTRUCTIONS:  Refrain from any heavy lifting, strenuous activity,  or driving for two days; maintain low fat/cholesterol diet.  Call our office  if there is any swelling, bleeding of the groin.  Arrangements will be made  through our office for the patient to return for a follow-up BMET later this  week.  He will follow-up with Dr. [name] in the office in four-six weeks.   DISCHARGE DIAGNOSES:  1. Severe cerebrovascular disease.     A. 80% ulcerated right internal carotid artery stenosis.     B. Bilateral ostial vertebral artery disease (90% left; 70% right).  2. Peripheral vascular disease.     A. Status post left fem/popliteal bypass surgery with subsequent        thrombectomy.     B. Known 100% occlusion right superficial femoral artery.     C. Status post renal artery stenting in August of 2004.  3. Coronary artery disease.  A. Status post coronary artery bypass graft.  4. Preserved left ventricular function.  5. Dyslipidemia.  6. History of tobacco.  7. Hypertension.  8. History of renal insufficiency.      Gene Serpe, P.A. [facility]                      Willa Rough, M.D.    GS/MEDQ  D:  06/02/2003  T:  06/02/2003  Job:  [id number]   cc:   Balinda Quails, M.D.  [address]  [place]  [place] [address]

## 2010-12-30 NOTE — Discharge Summary (Signed)
[name], [name]                   ACCOUNT NO.:  [id number]   MEDICAL RECORD NO.:  [id number]          PATIENT TYPE:  INP   LOCATION:  3729                         FACILITY:  [facility]   PHYSICIAN:  Salvadore Farber, M.D. LHCDATE OF BIRTH:  August 05, 1935   DATE OF ADMISSION:  11/01/2005  DATE OF DISCHARGE:  11/02/2005                                 DISCHARGE SUMMARY   PROCEDURES:  Transfusion of two units of packed red cells.   PRIMARY DIAGNOSES:  1.  Chest pain, CK-MB negative x3, troponin I 0.12, then 0.17, then 0.09.      On medical therapy at this time.  2.  Anemia secondary to blood loss from surgery, status post two units of      packed red cells this admission.  3.  History of acute renal failure with a creatinine of 14 in early March      2007.  4.  Hypovolemic shock secondary to nausea, vomiting and dehydration in early      March 2005.  5.  Peripheral vascular disease, status post redo left femoral-popliteal      bypass, discharged October 25, 2005, with a hemoglobin of 9.1, hematocrit      of 26.3.  6.  Hypotension this admission with a systolic blood pressure in the 80s.  7.  Hypertension.  8.  Moderate mitral regurgitation.  9.  Hyperlipidemia.  10. Status post aortocoronary bypass surgery in 1995 with left internal      mammary artery to left anterior descending artery, saphenous vein graft      to second diagonal, saphenous vein graft to posterior descending artery,      saphenous vein graft to posterior descending artery and PLV.  11. Status post percutaneous transluminal coronary angioplasty and stent to      the saphenous vein graft to the right coronary artery in 2004.  12. Status post percutaneous transluminal coronary angioplasty and stent x2      to the saphenous vein graft to the circumflex, proximal and midportions.  13. Status post percutaneous transluminal coronary angioplasty of the      saphenous vein graft to the right coronary artery for in-stent  restenosis.  14. Drug-eluting stent to the saphenous vein graft to diagonal #2 as well as      drug-eluting stent to the saphenous vein graft to posterior descending      artery.  15. Preserved left ventricular function with an ejection fraction of 55-65%,      moderate mitral regurgitation by echo in 2004.  16. Status post left femoral-popliteal bypass in 1998, thrombectomy in 2000      and redo October 23, 2005.  17. Status post bilateral carotid endarterectomy in 2005.  18. Chronic obstructive pulmonary disease.  19. Status post bilateral renal artery stents in 2004.  20. Ongoing tobacco use.  21. Family history of coronary artery disease.   TIME AT DISCHARGE:  34 minutes.   HOSPITAL COURSE:  [name] is a 76 year old male with known coronary artery  disease.  He had substernal chest pain starting while he  was watching TV.  Nitroglycerin helped.  He came to the emergency room and was admitted for  further evaluation and treatment.   His hemoglobin on admission was 8.9 with a hematocrit of 26.1.  He was  transfused two units of packed red cells and his general medical condition  improved and his chest pain resolved.  His CK-MBs were within normal limits  but he had a minimal elevation in his troponin.  Dr. Samule Ohm recommended  catheterization but [name] was adamantly opposed to this.  Of note, his  systolic blood pressure was in the 80s on admission and it also improved  with the blood transfusion.   On November 02, 2005, p.m., [name] was stating that he did not wish any  further inpatient workup.  His evaluation by Dr. Samule Ohm is pending, but he  is tentatively considered stable for discharge on November 02, 2005, with  outpatient follow-up arranged.   DISCHARGE LABORATORY DATA:  Hemoglobin 9.8, hematocrit 28.1, WBC 5.1,  platelets 308.  Sodium 138, potassium 4.1, chloride 107, CO2 26, BUN 11,  creatinine 1.1, glucose 94.  Alkaline phosphatase 122, total protein 5.3,  albumin  2.5.  TSH 0.861.  The patient was guaiac-negative.   Chest x-ray shows possible pulmonary venous hypertension but no edema.   DISCHARGE INSTRUCTIONS:  1.  His activity level is to include no strenuous activity for a week.  2.  He is to follow up with [facility] involving care of his incision and staple      removal.  3.  He is to stick to a low-fat diet.  4.  He is to follow up with Dr. Samule Ohm on May 3 at 11 a.m. and with [facility] as      scheduled.      Theodore Demark, P.A. [facility]      Salvadore Farber, M.D. [facility]  Electronically Signed    RB/MEDQ  D:  11/02/2005  T:  11/04/2005  Job:  [id number]   cc:   Balinda Quails, M.D.  [address]  [place]  [place] [address]   Corwin Levins, M.D. [facility]  [address]  [place]  [place] [address]

## 2010-12-30 NOTE — Discharge Summary (Signed)
   NAME:  TRISTIAN, SICKINGER                             ACCOUNT NO.:  [id number]   MEDICAL RECORD NO.:  [id number]                   PATIENT TYPE:  INP   LOCATION:  3704                                 FACILITY:  [facility]   PHYSICIAN:  [name], M.D.                   DATE OF BIRTH:  1934-11-27   DATE OF ADMISSION:  03/23/2003  DATE OF DISCHARGE:  03/26/2003                           DISCHARGE SUMMARY - REFERRING   Please send copy of discharge summary to Dr. Waverly Ferrari.  The  discharge summary number in question is [phone].  This copy needs to go to  Dr. [name], Dr. [name], and Dr. Waverly Ferrari at [facility].      Maple Mirza, P.A.                    [name], M.D.    GM/MEDQ  D:  03/26/2003  T:  03/26/2003  Job:  [id number]

## 2010-12-30 NOTE — Discharge Summary (Signed)
[name], PLEMMONS                             ACCOUNT NO.:  [id number]   MEDICAL RECORD NO.:  [id number]                   PATIENT TYPE:  INP   LOCATION:  3705                                 FACILITY:  [facility]   PHYSICIAN:  [name], M.D.                   DATE OF BIRTH:  04/30/1935   DATE OF ADMISSION:  02/10/2003  DATE OF DISCHARGE:  02/18/2003                           DISCHARGE SUMMARY - REFERRING   BRIEF HISTORY:  This is a 76 year old male who previously underwent coronary  artery bypass graft surgery in 1995.  The patient had recurrent chest pain  approximately one year after his surgery; he underwent cardiac  catheterization and was found to have a 90% stenosis in an unprotected  circumflex.  He was treated medically.  He was also found to have severe  peripheral vascular disease with 90% bilateral renal artery stenosis and  decreased ankle-brachial indices, most prominent on the left, with a history  of hypertension, hyperlipidemia, and tobacco use.  The patient apparently  did well until just recently when he began having chest pain again; he was  evaluated by Dr. Chales [name] approximately five days prior to this most recent  admission.  He was on Coumadin at that time, and he was told to stop his  Coumadin in preparation for an angiogram.  In the interim, the patient  developed chest pain and was admitted to [facility] on February 10, 2003, by Dr. Dietrich Pates.   PAST MEDICAL HISTORY:  Please see history as noted above.   SOCIAL HISTORY:  The patient previously worked as a Music therapist; he is now  retired.  He is married.  He lives with his wife in [place].  He  continues to smoke.   HOSPITAL COURSE:  As noted, this patient was admitted to [facility]  for further evaluation of chest pain.  He has a long history of coronary  artery disease as well as peripheral vascular disease.  The patient  underwent cardiac catheterization on February 10, 2003, performed by Dr.  Antoine Poche; the patient was found to have severe native-vessel coronary artery  disease and severe two-vessel graft stenosis.  The LIMA to the LAD graft was  widely patent.  The saphenous vein graft to the diagonal was widely patent.  The saphenous vein graft to the mid obtuse marginal had a focal 95% proximal  lesion and a long mid 99% lesion.  The saphenous vein graft to the right  coronary artery had a proximal 60% lesion followed by a 99% lesion.  The  left ventricle was not injected secondary to renal insufficiency.  There was  also noted to be a 95% ostial vertebral artery lesion on the left.   The decision was made to proceed with intervention, which was performed by  Dr. Chales [name] on February 10, 2003.  The patient had a PTCA stent placed  in the  saphenous vein graft to the right coronary artery with a reduction of the  initial lesion from 95% to 10% using a Hepacoat stent.  The patient was to  undergo a staged procedure.   The patient was followed closely.  Cozaar was added at 50 mg b.i.d.  secondary to hypertension.   On February 13, 2003, the patient underwent further percutaneous interventions;  he had a stent placed in the saphenous vein graft to the obtuse marginal,  reducing the lesion from 95% to 10%.  He also had a stent performed at the  mid obtuse marginal lesion, reducing it from 95% to 10%.  Patient tolerated  this well.  It was recommended that he stay on Plavix indefinitely.   On February 14, 2003, a GI consult was obtained secondary to a drop in hemoglobin  and guaiac-positive stool; the patient was seen in consultation by Dr. Lina [name].  The patient was placed on Protonix 40 mg b.i.d.  It was recommended  that the aspirin or the Plavix be discontinued if possible, and an upper  endoscopy was recommended prior to discharge.   The patient did require a transfusion secondary to a low hemoglobin and  hematocrit.  He was placed on iron supplement, and he was scheduled for an  upper  endoscopy.   The upper endoscopy was performed on February 16, 2003, by Dr. Lina [name]; he  was found to have duodenitis, which was acute and felt to be mild to  moderate but no active bleeding.  A CLO test was performed, which was found  to be positive, and the patient was treated with Biaxin and Flagyl.   Prior to admission, the patient was on Coumadin; it was elected not to  restart the Coumadin secondary to the recent GI bleed.   The patient did have some problems with his right groin following the  interventions; an ultrasound was performed on February 17, 2003, that was  negative for a pseudoaneurysm or AV fistula.  Home arrangements were  eventually made to discharge the patient in improved and stable condition on  February 18, 2003.   LABORATORY DATA:  A CBC on the day of discharge revealed a hemoglobin of  10.6, hematocrit 30.3, WBCs 6400, platelets 181,000.  A chemistry profile on  February 17, 2003, revealed a BUN of 12, creatinine 1.8, potassium 3.9, glucose  97.  A chest x-ray on February 13, 2003, showed cardiomegaly and increasing  interstitial edema.  A Helicobacter screen was positive.  The patient's  hemoglobin did drop to 8.4 with hematocrit of 24.4 on February 14, 2003; as  noted, he required transfusion.  His creatinine reached a maximum level of  2.1 on February 11, 2003; as noted, it improved to 1.8 by the time of discharge.  Cardiac enzymes were negative except for a mildly elevated troponin at 0.65  on February 14, 2003.  A lipid profile revealed a cholesterol of 111,  triglycerides were 81, HDL was 33, LDL was 62.  Iron studies revealed iron  to be low at 37, total iron-binding capacity was 217, within normal limits,  percent saturation was low at 17%.   DISCHARGE MEDICATIONS:  The patient was discharged on:  1. Plavix 75 mg daily to be continued indefinitely.  2. Cartia XT 240 mg daily.  3. Crestor 10 mg daily. 4. Nitroglycerin p.r.n. for chest pain.  5. Enteric-coated aspirin 81 mg daily.   6. Niferex 150 mg daily.  7. Toprol-XL 50  mg daily.  8. Cozaar 50 mg b.i.d.  9. Imdur 60 mg daily.  10.      Hydrochlorothiazide 12.5 mg daily.  11.      Biaxin 500 mg b.i.d. times two weeks.  12.      Flagyl 500 mg b.i.d. times two weeks.   ACTIVITY:  The patient was told to avoid any strenuous activity or driving  for at least two days.   DIET:  He is to be on a low-salt, low-fat diet.   DISCHARGE INSTRUCTIONS:  1. He was told to call the office if he has any problems with his     catheterization site.  2. He was told to quit smoking.   FOLLOW UP:  1. He is to follow up with Dr. Chales [name] on February 24, 2003, at 10:30 a.m.  2. Dr. Jonny Ruiz as needed or as scheduled.  3. As noted, the patient will need to have a colonoscopy performed at some     point to further evaluate for GI bleeding and anemia.   PROBLEM LIST AT THE TIME OF DISCHARGE:  1. Coronary artery disease with previous coronary artery bypass graft     surgery in 1995 with a left internal mammary artery to the left anterior     descending, saphenous vein graft to the diagonal, saphenous vein graft to     the obtuse marginal, and saphenous vein graft to the right coronary     artery.  2. Status post percutaneous transluminal coronary angioplasty and stent of     the saphenous vein graft to the right coronary artery, performed on February 10, 2003, by Dr. Chales [name].  3. Status post percutaneous transluminal coronary angioplasty and stent of     the saphenous vein graft to the obtuse marginal times two, performed on     February 13, 2003.  4. Ultrasound of the right groin, performed on February 17, 2003, negative for     pseudoaneurysm or atrioventricular fistula.  5. Anemia, requiring transfusion.  6. Guaiac-positive stool with upper endoscopy performed on February 16, 2003,     revealing duodenitis with positive CLO test; Biaxin and Flagyl initiated.     Outpatient colonoscopy to be performed at some point in the future to     further  evaluate.  7. Coumadin prior to admission, discontinued this admission secondary to     gastrointestinal bleeding.  Reason for the Coumadin is not known for     sure.  8. Renal artery stenosis noted this admission with intervention to be     performed in four to six weeks by Dr. Chales [name].  9. History of peripheral vascular disease with carotid artery stenosis, 80%     on the right, 60 to 80% on the left.  10.      Ongoing tobacco use.  11.      Probable chronic obstructive pulmonary disease.  12.      Hypertension.  13.      History of elevated lipids, currently being treated.  14.      A 2-D echocardiogram performed on February 11, 2003, revealing ejection     fraction of 55 to 65% of moderate to severe mitral regurgitation.  15.      Renal insufficiency, improving.  16.      Sinus bradycardia, asymptomatic.   PLAN:  1. As noted, the patient will need a followup evaluation for possible    intervention of his renal artery stenosis.  2. He will need followup for an outpatient colonoscopy to be performed at     some point in the near future.  3. A decision will need to be made with regards to reinitiating Coumadin     therapy.     Delton See, P.A. [facility]                  [name], M.D.    DR/MEDQ  D:  02/18/2003  T:  02/18/2003  Job:  [id number]   cc:   Corwin Levins, M.D. [facility]    cc:   Corwin Levins, M.D. [facility]

## 2010-12-30 NOTE — H&P (Signed)
NAMESAMEL, Roberto NO.:  [id number]   MEDICAL RECORD NO.:  [id number]          PATIENT TYPE:  INP   LOCATION:  3729                         FACILITY:  [facility]   PHYSICIAN:  Jonelle Sidle, M.D. LHCDATE OF BIRTH:  1935/07/07   DATE OF ADMISSION:  11/01/2005  DATE OF DISCHARGE:                                HISTORY & PHYSICAL   PRIMARY CARDIOLOGIST:  Salvadore Farber, M.D.   PRIMARY CARE PHYSICIAN:  Corwin Levins, M.D.   REASON FOR ADMISSION:  Chest pain.  [name] is a 76 year old Caucasian  gentleman with a known history of coronary artery disease, status post  coronary artery bypass graft surgery, with multiple interventions since that  time, who was just recently discharged from [facility],  status post femoral-popliteal bypass graft by Dr. Balinda Quails on October 23, 2005.  The patient states he had no problems with surgery, without any  complications, and was discharged home.  He has done well since that time.  This morning he was watching TV at around 10 a.m., when he had a sudden  episode of substernal chest discomfort.  He states it was pressure with a  lot of belching.  He states that he felt very bloated.  He rated his chest  discomfort at around a 2.  He took one nitroglycerin sublingual, with some  relief.  The discomfort lasted for around 10 minutes.  He continued to rest  and the chest pain slightly resolved; however, his wife insisted that he  come to the emergency room to get evaluated.   In the emergency room he was given Toprol XL 25 mg by the emergency room  staff, secondary to tachycardia.  A 12-lead electrocardiogram at a rate of  111 with noted ST depression in the lateral leads.  The patient is currently  pain-free.   ALLERGIES:  No known drug allergies.   MEDICATIONS:  1.  Crestor 40 mg.  2.  Lisinopril 5 mg.  3.  Plavix 75 mg.  4.  Toprol XL 100 mg.  5.  Norvasc 10 mg.  6.  Clonidine 0.1 mg b.i.d.  7.   Isosorbide mononitrate 60 mg.  8.  Oxycodone p.r.n.  9.  Aspirin 325 mg.   PAST MEDICAL HISTORY:  1.  Hypertension.  2.  Hyperlipidemia.  3.  Coronary artery disease.  4.  Chronic obstructive pulmonary disease.  5.  Ongoing tobacco abuse.  6.  Bilateral renal artery stenosis with stent placements in 2004.   INITIAL PROCEDURE:  Includes coronary artery bypass graft surgery in 1995.   PROCEDURES SINCE THE ABOVE:  1.  A cardiac catheterization in June 2004, at which time the patient had a      percutaneous transluminal coronary angiography and a stent placement to      the saphenous vein graft to the right coronary artery.  2.  On February 13, 2003, a cardiac catheterization, status post percutaneous      transluminal coronary angiography and stent placement to the proximal      segment  of the saphenous vein graft to the left circumflex artery.  A      percutaneous transluminal angioplasty and stent placement in the mid-      section of the saphenous vein graft to the left circumflex artery.  3.  On July 21, 2003, status post percutaneous coronary intervention of      the saphenous vein graft to the right coronary artery, with reduction of      95% in-stent restenosis to 10%.  4.  A cardiac catheterization on May 26, 2004, with a successful drug-      eluting stent placement to the saphenous vein graft to the second      diagonal branch, resulting in an improvement from total occlusion to no      residual stenosis.  Successful drug-eluting stent placement in a      saphenous vein graft to the patent ductus arteriosus.  5.  Echocardiogram in June 2004, showing an ejection fraction of 55%-65%,      with moderate mitral regurgitation.  6.  Peripheral vascular disease, status post left femoral-popliteal bypass      on October 23, 2005.  7.  Bilateral carotid endarterectomy in 2005.  8.  Bilateral renal artery stenosis with stent placements in 2004.   SOCIAL HISTORY:  The patient lives  in [place] with his wife.  He is  retired.  Ongoing tobacco use x50 years.  Denies any ETOH, drug or herbal  medicine use.   FAMILY HISTORY:  Mother deceased at age 34, secondary to cancer.  Father  deceased at age 59, secondary to coronary artery disease.  Has a sibling  with renal disease.   REVIEW OF SYSTEMS:  Positive for weight loss.  The patient states he has  lost 23 pounds in the last six months.  He also complains of decreased  appetite and abdominal discomfort, gastroesophageal reflux disease symptoms,  chest pain as described above, dyspnea on exertion and peripheral edema in  the lower left extremity after femoral-popliteal surgery.  All other systems  negative per the patient.   PHYSICAL EXAMINATION:  VITAL SIGNS:  Heart rate 111 initially, now 77,  respirations 22, blood pressure 172/75.  GENERAL:  In no acute distress, pleasant, cooperative and appears his stated  age.  HEENT:  Normocephalic and atraumatic.  Pupils equal, round, reactive to  light.  NECK:  Supple without lymphadenopathy.  The patient has soft bilateral  carotid bruits.  Negative for jugular venous distention.  CARDIOVASCULAR:  Heart rate and rhythm regular.  S1, S2.  Initially  tachycardic.  LUNGS: Clear to auscultation bilaterally.  ABDOMEN:  Soft, nontender.  Positive bowel sounds.  EXTREMITIES:  Lower right extremity without clubbing, cyanosis or edema.  Posterior tibial pulse unable to palpate.  Left lower extremity:  He has +1  pitting edema with palpable pedal pulses on the left.   A chest x-ray shows no acute disease.   Electrocardiogram initially at a rate of 112, sinus tachycardia with  occasional PVC's with ST depression in the lateral leads.  Repeat  electrocardiogram showing a sinus rhythm at a rate of 82, ST depression in  the lateral leads, resolved.  LABORATORY DATA:  Hemoglobin 8.9, hematocrit 26.2.  Sodium 138, potassium  3.9, BUN 11, creatinine 1.1 with a glucose of 111.   Troponin point of care  marker less than 0.05.   PLAN:  Dr. Jonelle Sidle in to examine and assess the patient, with the  medical history as stated above.  The electrocardiogram with depression of  the ST leads and inferolateral leads, improved with a decreased heart rate  after a beta blocker.  Also with anemia.  The patient will be admitted,  observation, cycle cardiac markers, transfuse one unit of packed red blood  cells.  Follow up CBC and guaiac stools.  Will add a proton pump inhibitor.  The patient will need follow-up ischemic testing.  If the enzymes are  negative and clinically stable, likely can proceed with a Myoview as an  outpatient.  Otherwise consider cardiac catheterization.      Dorian Pod, NP    ______________________________  Jonelle Sidle, M.D. [facility]    MB/MEDQ  D:  11/01/2005  T:  11/02/2005  Job:  [id number]

## 2010-12-30 NOTE — Discharge Summary (Signed)
NAME:  [name], [name]                             ACCOUNT NO.:  [id number]   MEDICAL RECORD NO.:  [id number]                   PATIENT TYPE:  INP   LOCATION:  3315                                 FACILITY:  [facility]   PHYSICIAN:  Balinda Quails, M.D.                 DATE OF BIRTH:  January 24, 1935   DATE OF ADMISSION:  07/14/2003  DATE OF DISCHARGE:  07/15/2003                                 DISCHARGE SUMMARY   ADMISSION DIAGNOSIS:  Severe right internal carotid artery stenosis.   PAST MEDICAL HISTORY:  1. Carotid artery disease.  2. Peripheral vascular occlusive disease.  3. Hypertension.  4. Hypercholesterolemia.  5. Coronary artery disease status post FEMI.  6. Chronic pulmonary obstructive disease.  7. Renal insufficiency.  8. Bilateral renal artery stenosis.  9. Tobacco abuse.  10.      Hiatal hernia.  11.      The patient specifically denies diabetes mellitus, CHF and thyroid     disease.   PAST SURGICAL HISTORY:  1. Left femoral-to-popliteal bypass in 1998 by Dr. Madilyn Fireman.  2. Thrombectomy of the left femoral-to-popliteal bypass graft in 2000.  3. Percutaneous transluminal angioplasty bilateral renal arteries.  4. Coronary artery bypass grafting x5 in 1995 by Dr. Tyrone Sage.  5. Percutaneous transluminal coronary angioplasty in 2004, stent x2.  6. Appendectomy.  7. Back surgery x2, lumbar disk.   ALLERGIES:  No known drug allergies.   DISCHARGE DIAGNOSIS:  Right carotid artery stenosis status post right  carotid endarterectomy.   BRIEF HISTORY:  The patient is a 76 year old male referred by Dr. [name] to  Dr. Madilyn Fireman for evaluation of carotid artery disease.  The patient underwent a  carotid duplex in September, 2004 which showed a 60 to 79% left internal  carotid artery stenosis artery stenosis, an 80% right carotid artery  stenosis.  A cerebral arteriogram was reviewed by Dr. Madilyn Fireman which revealed  severe right internal carotid artery stenosis, a moderate to severe right  vertebral artery stenosis, and a high-grade left vertebral artery stenosis.  The patient denied any neurologic symptoms.  He had no speech, visual, motor  or sensory deficit.  After evaluating the patient it was Dr. Madilyn Fireman' opinion  that the patient should be admitted for a right carotid  endarterectomy.   HOSPITAL COURSE:  The patient was admitted and taken to the operating room  on November 30,2004 where right carotid endarterectomy with Dacron patch  angioplasty was performed.  The patient tolerated the procedure well.  The  patient was hemodynamically stable immediately postoperatively and was  extubated without problem.  The patient woke up from anesthesia  neurologically intact.  The patient progressed as expected postoperatively.  On postoperative day one, and date of discharge, the patient was without  complaint.  The patient's vital signs were stable with a blood pressure of  135/40, heart rate of 55, O2 saturation of 91%  on room air.  The patient was  afebrile with a temperature of 98.1.   PHYSICAL EXAMINATION:  CARDIAC:  Regular rate and rhythm with a 2/6 systolic  flow murmur auscultated.  LUNGS:  There were bilateral rhonchi which cleared with cough.  They were  otherwise clear.  ABDOMEN:  Soft, nontender, nondistended, bowel sounds were present and the  incision was healing well with no evidence of hematoma and no drainage.  NEUROLOGIC:  The patient was intact.   The patient has been able to eat and ambulate without difficulty.  The  patient is felt to be stable for discharge at this time.   LABORATORY DATA:  CBC on July 15, 2003:  white count 9000, hemoglobin  12.1, hematocrit 34.9, platelets 131,000.  BMP on July 15, 2003:  sodium  140, potassium 3.6, BUN 11, creatinine 1.1, glucose 112.   CONDITION ON DISCHARGE:  Improved.   INSTRUCTIONS:  The patient is to resume his home medications which include:  1. Toprol XL 50 mg p.o. daily.  2. Protonix 40 mg p.o.  daily.  3. Clonidine 0.1 mg p.o. b.i.d.  4. Imdur 60 mg p.o. daily.  5. Crestor 10 mg p.o. daily.  6. Pletal 100 mg p.o. b.i.d.  7. Plavix 75 mg p.o. daily.  8. Aspirin 325 p.o. daily.  9. Tylox 1-2 tablets q.4-6h p.r.n. pain.   ACTIVITY:  The patient is to refrain from driving, heavy lifting and  strenuous activity.  The patient should continue daily breathing and walking  exercises.   DIET:  Should be balanced, low fat, low salt and low cholesterol.   WOUND CARE:  The patient may shower daily and clean his incision with soap  and water.  The patient is to refrain from putting any lotions or creams  directly on the incision.  If the patient notices redness, drainage,  swelling or pain coming directly from the incision or if the patient has a  fever of 101 he is to call the [facility] office.   FOLLOW UP:  The patient has a follow up appointment with Dr. Madilyn Fireman at [facility]  on Monday, July 27, 2003 at 0950 Hr.      Pecola Leisure, [place]                      Balinda Quails, M.D.   AY/MEDQ  D:  07/15/2003  T:  07/15/2003  Job:  [id number]   cc:   [name], M.D.

## 2010-12-30 NOTE — Op Note (Signed)
NAME:  [name], [name]                             ACCOUNT NO.:  [id number]   MEDICAL RECORD NO.:  [id number]                   PATIENT TYPE:  INP   LOCATION:  2920                                 FACILITY:  [facility]   PHYSICIAN:  [name], M.D. [facility]               DATE OF BIRTH:  [birth year]   DATE OF PROCEDURE:  02/16/2003  DATE OF DISCHARGE:                                 OPERATIVE REPORT   PROCEDURE:  Upper endoscopy.   ENDOSCOPIST:  [name], M.D.   INDICATIONS FOR PROCEDURE:  This 76 year old gentleman with severe coronary  artery disease, we were asked to see because of a drop in his hemoglobin  from 12.9 to 8.4 over a period of several days following cardiac procedures.  He was Hemoccult-positive and gives a history of peptic ulcer disease.  He  had a repeat upper GI five years ago, requiring a blood transfusion.  He  this time denied any abdominal pain.  The patient has been on aspirin 325 mg  daily and Plavix, tried on Coumadin.  His iron saturation was low at 17%.  He is now undergoing an upper endoscopy for __________ activity and severe  __________ bleeding.   ENDOSCOPE:  Fujinon single-channel videoscope.   SEDATION:  Versed 7.5 mg IV.   FINDINGS/DESCRIPTION OF PROCEDURE:  The Fujinon single-channel video  endoscope was passed under direct vision through the posterior pharynx and  into the esophagus.  The patient was monitored by pulse oximeter.  His  oxygen saturations were normal.  The proximal, mid and distal esophageal  mucosa were unremarkable with normal squamocolumnar junction.  There were no  erosions, and no hiatal hernia.  STOMACH:  The stomach was insufflated with air and showed no abnormal-  appearing gastric folds in the body of the stomach.  The gastric antrum  showed mild patchy erythema.  In the pylorus there were large edematous  folds which converged into the pylorus and caused some spasm of the pyloric  channel.  The endoscope was then pushed  through into the duodenal bulb.  DUODENUM:  The duodenal bulb showed multiple erosions, edematous folds, and  edema, consistent with moderate disease duodenitis.  Small specks of blood  were noticed in the duodenal bulb, but there was no active bleeding itself.  The mucosa was somewhat friable.  There was no obstruction from the duodenal  outlet.  Biopsies were taken for CLO test.  The descending duodenum was  normal.  The endoscope was then brought back into the stomach and  retroflexed.  The fundus and cardia appeared normal.   IMPRESSION:  Moderately-severe duodenitis.  No active bleeding.  Status post  CLO test.    PLAN:  As long as the patient stays on aspirin and Plavix, I would recommend  keeping him on a proton pump inhibitor.  He will stay on Protonix 40 mg  b.i.d. while in  the hospital, and then 40 mg p.o. daily at home.  The  patient also will be started on iron supplements and will monitor his stools  for blood.  If he remains Hemoccult positive in the near future, he should  undergo a colonoscopy, if his general medical condition allows it.                                                [name], M.D. [facility]    DB/MEDQ  D:  02/16/2003  T:  02/16/2003  Job:  [id number]   cc:   Wilcox Bing, M.D.

## 2010-12-30 NOTE — Op Note (Signed)
NAME:  [name], [name]                             ACCOUNT NO.:  [id number]   MEDICAL RECORD NO.:  [id number]                   PATIENT TYPE:  INP   LOCATION:  3704                                 FACILITY:  [facility]   PHYSICIAN:  [name], M.D.                   DATE OF BIRTH:  01-06-1935   DATE OF PROCEDURE:  03/24/2003  DATE OF DISCHARGE:                                 OPERATIVE REPORT   PROCEDURE PERFORMED:  1. Abdominal aortogram.  2. Bilateral lower extremity angiogram.  3. Bilateral selective renal angiogram.  4. PTA and stent placement to left renal artery.  5. PTA and stent placement to right renal artery.   DIAGNOSIS:  1. Peripheral vascular disease.  2. Claudication.  3. Renal artery stenosis.  4. Hypertension.  5. Chronic renal insufficiency.   HISTORY:  [name] is a 76 year old gentleman with advanced coronary and  peripheral vascular disease who has undergone surgical revascularization to  his coronaries as well as left femoral popliteal bypass graft.  He complains  of worsening bilateral lower extremity claudication, right greater than  left.  He has also had poorly controlled hypertension with recurrent bouts  of angina and has had chronic and acute renal insufficiency.  He has  bilateral renal artery stenosis by prior assessment.  He presents now for  angiographic assessment and intervention.  The patient was admitted to the  hospital prior to the procedure for IV hydration.   SURGICAL TECHNIQUE:  Informed consent was obtained.  The patient was brought  to the peripheral vascular lab.  A 6 French sheath was placed in the right  femoral artery using modified Seldinger technique.  A 5 French pigtail  catheter was advanced to the abdominal aorta and abdominal aortogram  performed using power injections of contrast.  Subsequently, a 6 French  right guide catheter was used to perform selective renal angiography.  Intervention was then carried out as described  before.  Following  intervention, the pigtail catheter was reintroduced and bilateral lower  extremity angiogram was performed using power injected contrast.   The initial findings are as follows:   1. Abdominal aorta is of normal caliber.  There is heavy calcification and     severe atherosclerotic disease without critical stenosis or dilatation.     The left renal artery is single and is a large caliber vessel with high     grade stenosis of 90% in the mid section that is hazy.  There is post     stenotic dilatation.  The right renal artery is single and patent, it is     a large caliber vessel with a high grade ostial narrowing of at least     70%.  There is mild post stenotic dilatation.   1. Right lower extremity.  The right iliac artery has mild disease 30%, the  common femoral artery has severe disease of 70%.  The superficial femoral     artery is then occluded proximally.  It reconstitutes in the distal     segment via collaterals.  The popliteal artery has diffuse disease of 30-     40%.  The trifurcation vessels are severely diseased with occlusion of     the anterior tibial artery proximally.  The posterior tibial artery has     severe disease of 80-90% in the mid section and the peroneal artery has     diffuse disease of 60%, as well.  Two vessel run off is noted at the     ankle.   1. The left lower extremity.  The left iliac artery has diffuse disease of     30%.  The superficial femoral artery is occluded proximally.  There is a     patent femoral to popliteal bypass graft that then supplies the popliteal     artery which has mild disease.  The trifurcation vessels are again     severely diseased with occlusion of the anterior and posterior tibial     arteries.  The peroneal artery is patent with severe disease of 80-90% in     the mid section, single vessel delayed run off is noted to the ankle.   With these findings, we elected to proceed with percutaneous  intervention of  the renal arteries.  The patient was given 5000 units of heparin  intravenously, he had been pretreated with Plavix.  The 6 [place] guide  catheter was used to perform selective angiography as noted.  It was then  used to engage the left renal artery.  Initial attempts were made to cross  the lesion using a stabilizer wire, however, this proved unsuccessful and a  .014 wire was advanced to the distal section of the vessel.  Due to the high  grade stenosis, a 6 by 2 Aviator balloon was used to predilate the lesion,  two inflations were made at 6 and 10 atmospheres for 30 seconds.  Repeat  angiography showed residual disease of 60%.  A [id number] stent was then  introduced, carefully positioned in the mid section of the left renal artery  and deployed at 10 atmospheres for 30 seconds.  Repeat angiography showed an  excellent result with full coverage of the lesion, no residual stenosis and  no vessel damage.  This was deemed an acceptable result. Attention was  turned then to the right renal artery and the extra port wire positioned in  the distal segment of the vessel.  A [id number] stent was carefully  positioned, extending to the ostium of the vessel, and deployed at 10  atmospheres for 30 seconds.  There was moderate residual waist in the  proximal segment with taper of the vessel and a repeat inflation was made  across the ostium and proximal segment of the vessel at 16 atmospheres and  30 seconds.  Repeat angiography showed an excellent result with only mild  residual taper of 10-15% at the ostium, there was full coverage of the  lesion and significantly improved flow.  There was no evidence of distal  vessel damage.  This was deemed an acceptable result.  The guide catheter  was then removed.   The guide catheter was then removed as was the sheath.  Manual pressure was applied until adequate hemostasis was achieved.  The patient tolerated the  procedure well and was  transferred to the floor in stable  condition.   FINAL RESULTS:  1. Successful PTA and stent placement to the mid left renal artery with     reduction of 90% narrowing to 10% with placement of 6 by 15 Genesis     stent.  2. Successful PTA and stent placement to the proximal segment of the right     renal artery with reduction of 70% narrowing to 10% with placement of a 6     by 18 Genesis stent.   ASSESSMENT AND PLAN:  Mr. Dettmer is a 76 year old gentleman with advanced  bilateral renal artery stenosis status post intervention.  He will be  treated with Plavix indefinitely.  His renal function will be followed  closely.  He has advanced peripheral vascular disease.  The left lower  extremity appears to exhibit significant small vessel disease below the knee  for which medical therapy will be pursued at this point.  He has long term  occlusion of the right superficial femoral artery.  Given his suboptimal  response in the past to intervention on the left lower extremity, surgical  revascularization will be the preferred treatment strategy with adjuvant  endarterectomy of the common femoral artery.  This will be considered in  context with the patient's symptoms.                                              [name], M.D.   Melton Alar  D:  03/24/2003  T:  03/24/2003  Job:  [id number]   cc:   Corwin Levins, M.D. [facility]

## 2010-12-30 NOTE — Discharge Summary (Signed)
[name]                   ACCOUNT NO.:  [id number]   MEDICAL RECORD NO.:  [id number]          PATIENT TYPE:  INP   LOCATION:  1402                         FACILITY:  [facility]   PHYSICIAN:  Barbette Hair. Arlyce Dice, MD,FACGDATE OF BIRTH:  [birth year]   DATE OF ADMISSION:  06/18/2007  DATE OF DISCHARGE:  06/26/2007                               DISCHARGE SUMMARY   ADMITTING DIAGNOSES:  83. A 76 year old white male with weight loss and progressive      postprandial abdominal pain and nausea, etiology not clear, rule      out underlying malignancy, rule out mesenteric ischemia.  2. Anemia and heme-positive stool, possibly secondary to gastric      changes as noted above.  Rule out other intestinal source.  3. Vasculopathy with multiple previous vascular procedures as outlined      in the history and physical, including a carotid endarterectomy,      coronary artery bypass grafting, coronary stenting in 2004, renal      artery stenting and multiple fem-pop bypasses.  4. Hypertension.  5. Chronic obstructive pulmonary disease.  6. Dyslipidemia.   DISCHARGE DIAGNOSES:  1. Postprandial abdominal pain and weight loss consistent with      mesenteric ischemia, with the SMA and celiac disease on      arteriogram, felt to be a causative of current symptoms.  2. Severe normocytic anemia, corrected.  3. Multiple colon polyps, status post colonoscopy and polypectomy this      admission.  4. Diverticulosis.  5. Other diagnoses as listed above.   CONSULTATIONS:  Dr. Hart Rochester, CVTS.   PROCEDURES:  1. Colonoscopy and polypectomy per Dr. Yancey Flemings.  2. Selective visceral arteriography June 24, 2007.  3. CT of the abdomen and pelvis June 18, 2007.  4. CT angio abdomen, June 19, 2007.   BRIEF HISTORY:  [name] is a 76 year old white male, who presented to  our office earlier in the week with complaints of postprandial nausea,  dyspepsia, and epigastric distress.  He has been apparently  been eating  much less recently and has lost 25 to 30 pounds over the past year, due  to these symptoms.  His symptoms have progressed over the past 1 month.  He was recently found to have Hemoccult-positive stool, which was  actually dark and more consistent with melena, when he was seen by Dr.  Leone Payor.  He is maintained on chronic Plavix, as well as aspirin and  Pletal and has significant history for other diffuse vascular disease.  He was scheduled for upper endoscopy which she underwent on November 4.  He was found to have some antral erythema and gastric retention, but it  was not felt that this was the etiology of his symptoms.  At this time,  he is admitted to the hospital for further diagnostic evaluation and  supportive management.   LABORATORY STUDIES:  On admission November 4, WBC of 5.9, hemoglobin  7.1, hematocrit of 21.1, MCV of 89.7, platelets 169.  On June 19, 2007, hemoglobin was 8, hematocrit of 23.  On November 8,  hemoglobin  11.2, hematocrit of 32.4, pro-time 13, INR of 1, PTT of 27,  electrolytes within normal limits, BUN 8 on admission, creatinine 1.0.  Followup on November 11, BUN 8, creatinine 0.94, albumin 2.7.  Liver  function studies normal.   X-RAY STUDIES:  CT angio of the abdomen shows an atheromatous abdominal  aorta with proximal stenosis of the celiac axis, SMA and IMA, which may  account for occlusive mesenteric ischemia.  He had high-grade stenosis  at the origin of the celiac axis estimated greater than 80%, at least  80% stenosis at the origin of the SMA over a relatively shorter segment.  IMA patent but with 75% stenosis.  CT of the abdomen and pelvis on  November 4:  No acute intra-abdominal findings.  Difficult to exclude  areas of wall thickening involving the proximal duodenum, severe  atherosclerotic disease with heavily-calcified visceral vessels  including the SMA, left adrenal lesion, probable benign adenoma, and  mesenteric  arteriography on November 10 shows essential subtotal  occlusion of the celiac origin with  predominant retrograde supply of  celiac branches via reversed flow in the gastroduodenal artery, severe  disease of a long segment of the proximal SMA trunk, with significant  irregular atherosclerotic plaque present causing 70% to 80% stenosis of  the trunk, tandem areas of disease in the branch vessels, and a widely  patent IMA which is compensated to supply the entire colon via  collateral supply.   HOSPITAL COURSE:  The patient was admitted to the service of Dr. Stan Head.  He was initially placed on IV fluids, continued on his home  meds. Baseline labs were obtained.  He was found to be severely anemic  with a hemoglobin in the 7 range.  He was typed and crossed and  transfused 2 units of packed cells, and then plans were made for  endoscopic evaluation.  He underwent upper endoscopy with Dr. Leone Payor  which showed an abnormal duodenal mucosa, friable mucosa with two small  areas of bleeding spontaneously.  He then underwent CT of the abdomen  and pelvis with findings outlined above.  Findings were most consistent  with mesenteric vascular disease, and he then had CT angio of the  mesenteric vessels with findings as above.  In the interim, he was  prepped for a colonoscopy and then underwent colonoscopy with Dr. Marina Goodell  on November 7, was found to have diverticular disease and multiple small  polyps, including one 2-cm sessile lesion in the rectum, there were 4 in  total, these were all removed.  The patient tolerated the procedure  well.  We did obtain CVTS consultation with Dr. Hart Rochester, who is familiar  with this gentleman.  He felt that he would require a formal angio of  the SMA, to see if he would be a candidate for stenting PTA or bypass  grafting.  This was initially scheduled for Dr. Madilyn Fireman to perform on the  following Monday; however, due to scheduling changes Dr. Madilyn Fireman was  unable  to do this, and it was scheduled then with Dr. Fredia Sorrow of  interventional radiology.  The patient tolerated this procedure well.  Findings are outlined above.  We awaited follow-up opinion from CVTS;  however, he was not followed up by the vascular surgeons and was very  anxious for discharge to home.  He was discharged on November 12, with  instructions to call for appointment with Dr. Hart Rochester for followup and  then any surgical or procedure plannings.  MEDICATIONS:  On discharge:  1. Imdur 60 mg daily.  2. Plavix 75 daily.  3. Clonidine 0.1 b.i.d.  4. Toprol XL 100 daily.  5. Crestor 40 daily.  6. Cilostazol 100 mg b.i.d.  7. Norvasc 10 daily.  8. Lisinopril 20 daily.  9. Isosorbide 60 daily.  10.Aspirin 325 daily.  11.Nitroglycerin p.r.n.  12.Tylenol p.r.n.  13.Maalox p.r.n.      Amy Esterwood, PA-C      Robert D. Arlyce Dice, MD,FACG  Electronically Signed    AE/MEDQ  D:  07/19/2007  T:  07/19/2007  Job:  [id number]   cc:   Quita Skye. Hart Rochester, M.D.  [address]  [place]  [place] [address]   Barbette Hair. Arlyce Dice, MD,FACG  [address]  [place]  [place] [address]   Corwin Levins, MD  [address]  [place]  [place] [address]

## 2010-12-30 NOTE — H&P (Signed)
NAME:  [name], [name]                             ACCOUNT NO.:  [id number]   MEDICAL RECORD NO.:  [id number]                   PATIENT TYPE:  INP   LOCATION:  2028                                 FACILITY:  [facility]   PHYSICIAN:  Willa Rough, M.D.                  DATE OF BIRTH:  06/06/1935   DATE OF ADMISSION:  06/01/2003  DATE OF DISCHARGE:                                HISTORY & PHYSICAL   HISTORY OF PRESENT ILLNESS:  The patient is a 76 year old male patient with  a known history of coronary artery disease, status post CABG, who also has a  history of peripheral vascular disease and has undergone left fem/pop bypass  surgery under the care of P. Liliane Bade, M.D.  He also has a history of  hypertension and underwent renal stenting in early August of 2004 with  improved control of his blood pressure.  Unfortunately, he has undergone  further percutaneous intervention to his bypass grafts for unstable angina  and was enrolled in the [place] II study in July of 2004.   Carotid ultrasound recently has shown severe disease with an 80% right ICA  stenosis.  Otherwise his peripheral vascular disease has been stable and  ABI's on the right were 0.81 and on the left were 0.89 in June of 2004.  He  has not had any significant lower extremity edema, orthopnea, PND, syncope,  or presyncope.   ALLERGIES:  No known drug allergies.   SOCIAL HISTORY:  He is married.  He quit smoking two months ago.  No alcohol  or illicit drug use.   PAST MEDICAL HISTORY:  As above.  History of treated hyperlipidemia.   CURRENT MEDICATIONS:  1. IMDUR 60 mg a day.  2. Crestor 10 mg a day.  3. Multivitamin daily.  4. Plavix 75 mg a day.  5. Toprol 50 mg a day.  6. Enteric-coated aspirin 325 mg a day.  7. Clonidine 0.1 mg b.i.d.  8. Protonix 40 mg a day.   FAMILY HISTORY:  No known history of significant coronary artery disease.   PHYSICAL EXAMINATION:  VITAL SIGNS: Temperature 98.8, pulse 103,  respirations 20, blood pressure 142/92, height 6 foot 1 inch, weight 193.  O2 saturations 93% on room air.  HEENT:  Grossly normal with the exception of bilateral carotid bruits, left  greater than right. No JVD or thyromegaly.  CHEST:  Clear to auscultation bilaterally.  No wheezing or rhonchi.  HEART:  Regular rate and rhythm.  No rub. There is a 2/6 systolic ejection  murmur.  ABDOMEN:  Soft and nontender.  Nondistended. No masses and no bruits.  EXTREMITIES:  No peripheral edema.  Peripheral pulses are diminished  bilaterally.   ASSESSMENT:  1. Severe right internal carotid artery disease.  2. Peripheral vascular disease, status post left fem/pop bypass graft under     the care of  Balinda Quails, M.D.  3. Coronary artery disease, status post surgical revascularization with     percutaneous intervention in July of 2004.  4. Well preserved left ventricular function.   The patient has been started on Pletal 100 mg b.i.d. for lower extremity  peripheral vascular disease.  His Toprol has also been increased to 100 mg a  day.  The patient has been admitted for cerebral angiogram to see whether a  surgical endarterectomy may define his anatomy and determine whether or not  he can be intervened upon percutaneously or if the patient will be a  surgical candidate (Dr. Madilyn Fireman).  The patient was seen and examined by  Willa Rough, M.D.      Guy Franco, P.A. [facility]                      Willa Rough, M.D.    LB/MEDQ  D:  06/01/2003  T:  06/01/2003  Job:  [id number]   cc:   [name], M.D.   Balinda Quails, M.D.  [address]  [place]  [place] [address]   Corwin Levins, M.D. [facility]

## 2010-12-30 NOTE — Op Note (Signed)
[name], [name]                   ACCOUNT NO.:  [id number]   MEDICAL RECORD NO.:  [id number]          PATIENT TYPE:  EMS   LOCATION:  MAJO                         FACILITY:  [facility]   PHYSICIAN:  [name], M.D.    DATE OF BIRTH:  [birth year]   DATE OF PROCEDURE:  03/12/2009  DATE OF DISCHARGE:  01/28/2009                               OPERATIVE REPORT   SURGEON:  Roberto Quails, MD   ASSISTANT:  Roberto Coombe, PA   ANESTHETIC:  General endotracheal.   PREOPERATIVE DIAGNOSIS:  Ischemic rest pain, left foot.   POSTOPERATIVE DIAGNOSIS:  Ischemic rest pain, left foot.   PROCEDURES:  1. Left femoral endarterectomy with Dacron patch angioplasty.  2. Left profundoplasty.  3. Left femoral anterior tibial bypass with reversed greater saphenous      vein graft.   CLINICAL NOTE:  [name] is a 76 year old male with history of advanced  peripheral vascular disease.  He has an occluded left femoral-popliteal  bypass.  He has ischemic rest pain in the left foot.  He has severe  tibial vessel disease.  He is brought to the operating at this time for  planned left lower extremity revascularization for limb salvage.   OPERATIVE PROCEDURE:  The patient was brought to the operating room in  stable condition.  Placed under general endotracheal anesthesia.  The  left leg was prepped and draped in a sterile fashion.   Longitudinal skin incision was made through the scar in the left groin.  Dissection was carried down through the subcutaneous tissue with  electrocautery.  Lymphatics divided.  Deep dissection carried down to  expose an occluded left femoral-popliteal Gore-Tex bypass graft.  The  proximal portion of the Gore-Tex graft was mobilized and dissected down  to an end-to-side anastomosis to the left common femoral artery.  The  left common femoral artery was severely diseased with plaque.  This was  dissected up to the inguinal ligament and encircled proximally with a  vessel  loop.  Distal dissection carried down to expose the origin of the  occluded superficial femoral artery, which was encircled with the vessel  loop.   The profunda femoris artery was then exposed.  Dissection carried down  over the profunda femoris ligating bridging veins with 3-0 silk.  The  veins were divided in the proximal portion and left profunda femoris  artery was exposed down to its first major branches.   In the medial aspect of the femoral incision, the saphenofemoral  junction was exposed.  This revealed a patent left greater saphenous  vein.  Tributaries ligated with 2-0 silk and divided.  Separated  longitudinal skin incisions were then made along the course of the left  greater saphenous vein down to the level of the mid calf.  The vein was  noted to be of moderate quality, relatively small at 4 mm.  Tributaries  of the vein ligated with 3-0 silk and clips, divided.  The vein fully  exposed and mobilized from the groin to the midcalf level.   The attention was then  placed on left anterior tibial artery.  This was  approached from the lateral longitudinal incision over the anterior  compartment.  Dissection carried down through the subcutaneous tissue.  The muscle fibers were split and the proximal left anterior tibial  artery was exposed.  This was a severely diseased vessel, 1.5 mm in  diameter.  The left anterior tibial artery was exposed and an area  relatively soft was chosen to perform the distal anastomosis, the artery  encircled with vessel loops.   The saphenous vein was then harvested by ligating it proximally and  distally with 2-0 silk.  Dilated with heparin saline solution.  The  bleeding side branches were repaired with 7-0 Prolene suture.   A tunnel was then created from the exposed anterior tibial artery  laterally, obliquely across the thigh up to expose the left common  femoral artery.  The patient was then administered 7000 units of heparin   intravenously.   The left femoral vessels were controlled with clamps.  The left femoral-  popliteal Gore-Tex graft was taken down.  This was chronically occluded.  An arteriotomy then extended proximally into the common femoral artery.  The arteriotomy was then extended distally down across the profunda  origin.  An endarterectomy of the left common femoral artery was carried  out extending this down to the profunda femoris origin.  A patch  angioplasty encompassing the left common femoral artery and proximal  left profunda femoris artery was then carried out using a Finesse Dacron  patch with running 6-0 Prolene suture.  At completion of the  angioplasty, the patch was opened longitudinally and the reverse  saphenous vein was anastomosed end-to-side to the left, at left common  femoral level, anastomosing this to the patch with running 6-0 Prolene  suture.  The proximal clamps were then released and the graft flushed  with excellent flow present through the vein graft.  This was controlled  with a bulldog clamp.  The vein graft was then brought down through the  tunneler to the exposed left anterior tibial artery.  The anterior  tibial artery controlled proximally and distally with bulldog clamps.  A  longitudinal arteriotomy made.  The artery would accept only a 1 mm  dilator.  It was severely diseased.  The reverse vein was beveled and  anastomosed end-to-side to the left anterior tibial artery using running  7-0 Prolene suture.  At completion of the anastomosis, the graft was  flushed as was the anterior tibial artery.  Clamps were then removed.  There was flow present through the graft into the anterior tibial  artery.  Doppler signal was moderately obstructive.   Adequate hemostasis was obtained.  Sponge and instrument counts were  correct.   Left groin incision was closed with running 2-0 Vicryl suture in 2  subcutaneous layers.  Staples applied to skin.  The vein harvest   incisions were closed with a running layer of 3-0 Vicryl suture in the  subcutaneous layer with staples applied to skin.   The left anterior tibial incision was closed with running 3-0 Vicryl  suture in the subcutaneous layer and interrupted 3-0 vertical mattress  nylon in the skin.  Sterile dressings were applied.  At time of transfer  to the recovery room, the left femoral anterior tibial bypass was  patent.      [name], M.D.  Electronically Signed     PGH/MEDQ  D:  03/12/2009  T:  03/13/2009  Job:  [id number]

## 2010-12-30 NOTE — H&P (Signed)
NAME:  [name], [name]                             ACCOUNT NO.:  [id number]   MEDICAL RECORD NO.:  [id number]                   PATIENT TYPE:  INP   LOCATION:  4707                                 FACILITY:  [facility]   PHYSICIAN:  Lorain Childes, M.D. [facility]        DATE OF BIRTH:  Jan 08, 1935   DATE OF ADMISSION:  07/20/2003  DATE OF DISCHARGE:                                HISTORY & PHYSICAL   CHIEF COMPLAINT:  Chest pain.   HISTORY OF PRESENT ILLNESS:  The patient is a 76 year old man with extensive  coronary history, history of CABG in 1995, recent PCI of his vein graft to  the OM with CYPHER stent and TAXUS stent on February 13, 2003, who is now  admitted with chest pain.  The patient reports he was rehabbing from his  recent carotid endarterectomy surgery done on July 14, 2003, without any  complications.  This evening he awoke around 1 a.m. and noted substernal  chest pain, sharp in nature, described as 3 to 4/10.  There was no  radiation, no shortness of breath, no diaphoresis, no palpitations, no  syncope.  He did have nausea but no vomiting.  He took one spray of  nitroglycerin for complete relief of his pain within five minutes.  He came  to the emergency room for evaluation.  This pain was similar to his previous  angina, however, not as intense.  Also he did not have his typical pressure  sensation associated with it.  In the emergency room, he received point of  care, cardiac enzymes which were negative.  His EKG was obtained.  He was  admitted for further evaluation.  Of note, the patient reports that he ate a  banana and peanut butter sandwich which upset his stomach prior to going to  sleep last night.   MEDICATIONS:  1. Aspirin 325 mg p.o. daily.  2. Nitroglycerin spray p.r.n.  3. Toprol XL 50 mg p.o. daily.  4. Clonidine 0.1 mg p.o. b.i.d.  5. Crestor 10 mg p.o. daily.  6. Plavix 75 mg p.o. daily.  7. Imdur 60 mg p.o. daily.  8. Protonix 40 mg p.o. daily.  9. Pletal  100 mg p.o. b.i.d.   ALLERGIES:  No known drug allergies.   PAST MEDICAL HISTORY:  1. Status post CABG in 1995, with LIMA to LAD, vein graft to the OM, vein     graft to diagonal, vein graft to the RCA.  His last cardiac     catheterization was February 10, 2003, which revealed left main 80% LAD, 100%     left circumflex, AV groove proximal mid 100%, OM was 100% ostial, RCA was     90% ostial, 100% mid.  His LIMA to the LAD is patent.  Vein graft to OM     is 90% long, proximal with a mid long 99%.  Vein graft to diagonal is  patent.  Vein graft to RCA had a long proximal 40% followed by 99%.  He     underwent PCI of his SVG to the OM, proximal 95% was reduced to 10% with     a 3.5 x 13 mm CYPHER stent in the mid 95% was reduced to 10% with a 3.0     21 TAXUS stent.  His echo dated January 16, 2003, showed an EF of 55 to 65%     with moderate mitral regurgitation.  2. Peripheral vascular disease status post left femoral popliteal in 1998     then post thrombectomy in 2000 of his left femoral artery.  3. Hypertension.  4. Status post right carotid endarterectomy in July 14, 2003.  5. Hyperlipidemia.  6. Chronic obstructive pulmonary disease.  7. Bilateral renal artery stenosis, status post stenting by Dr. [name].  8. Hiatal hernia.  9. History of contrast induced nephropathy.  10.      Iron-deficiency anemia.   SOCIAL HISTORY:  He lives in [place], [place], with his wife.  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  He has a 60+ pack year history of  tobacco.  He quit three months ago, however, now he smokes cigars.  He  reports occasional alcohol use, no drugs, no herbal medications.  His  exercise is gardening and yard work.  No specific diet he follows.   FAMILY HISTORY:  Mom died of cancer at the age of 19, father died at the age  of 61 from heart failure.  He had a brother who had MI at the age of 55.  His other brother is alive and well.  Another sister is alive and  well.   REVIEW OF SYMPTOMS:  No fever or chills.  No sweats.  No weight changes.  No  lymphadenopathy.  No headaches or visual changes.  No voice changes.  No  skin lesions.  CARDIOPULMONARY:  Per HPI.  Also denies any orthopnea, no  PND, no lower extremity edema.  No palpitations.  No presyncope.  No  claudications.  He does report a cough for the past couple of weeks.  No  wheezing.  Denies any urinary symptoms.  NEUROPSYCHIATRIC:  No weakness, no  numbness, no depression, anxiety, no myalgias or arthralgias.  No joint  deformities.  No melena, hematemesis.  No change in his bowel habits.  No  polyuria, no polydipsia.  No heat or cold intolerance.  All other systems  are negative.   PHYSICAL EXAMINATION:  GENERAL APPEARANCE:  He is an elderly male  comfortable, in no acute distress.  VITAL SIGNS:  Temperature 98.7, pulse 85, respiratory rate 15, blood  pressure 147/62, saturating 93% on room air.  HEENT:  Normocephalic and atraumatic.  Pupils are round and reactive to  light.  Extraocular muscles are intact.  Sclerae are white.  Conjunctivae  pink.  Oropharynx is clear.  NECK:  Supple.  He has a scar from right CEA noted with Steri-Strips in  place.  It is healing well and is nontender.  He has no carotid bruits.  His  JVP is approximately 6 cm.  LUNGS:  Long expiration.  Occasional rhonchi, otherwise clear.  CARDIOVASCULAR:  Normal S1 with S2 regular rate and rhythm.  No murmurs,  rubs, or gallops.  PMI is nondisplaced.  Pulses 1+ and equal.  He has a  right loud femoral bruit.  SKIN:  Unremarkable.  ABDOMEN:  Soft, positive bowel sounds, nontender.  His liver  is  approximately 7 to 8 cm.  He has no hepatojugular reflex.  EXTREMITIES:  He has no edema.  NEUROLOGIC:  Intact.   EKG shows sinus rhythm, rate 104, left axis deviation.  PR interval is 175  msec, QRS 117 msec, QVC 500 msec.  Infarct on the inferior leads and also anterolateral.  He has 1 mm ST depression in lateral  lead which is unchanged  from prior __________ hypertrophy.  Last EKG was June 2004.  Point of care  troponin is less than 0.01 and less than 0.05, MB is 2.4 and 1.6.   Other labs are pending.   ASSESSMENT/PLAN:  The patient is a 76 year old gentleman with extensive  coronary history, status post percutaneous coronary intervention February 13, 2003, with vein graft to the obtuse marginal with CYPHER intact, good stent  placement.  Now with an episode of chest pain.   1. Coronary artery disease.  Concern for unstable angina.  To start aspirin     and Lovenox and Plavix, Toprol, Effexor, nitroglycerin.  Cycle cardiac     enzymes and follow his EKG.  He says his pain was somewhat different than     his typical angina.  He does have atypical features.  We will obtain a     stress test if his cardiac enzymes remain negative to evaluate for any     ischemia.  Lastly, I reviewed smoking cessation in detail with patient     and his wife.  2. Hypertension.  Continue his home medications and titrate as needed.  3. Peripheral vascular disease.  Will continue on Plavix, Pletal, encourage     smoking cessation.  4. Status post __________ .  His site is healing well.                                                Lorain Childes, M.D. [facility]    CGF/MEDQ  D:  07/20/2003  T:  07/20/2003  Job:  [id number]   cc:   Corwin Levins, M.D. [facility]   [name], M.D.

## 2010-12-30 NOTE — Cardiovascular Report (Signed)
NAMEHILLARY, SCHWEGLER                             ACCOUNT NO.:  [id number]   MEDICAL RECORD NO.:  [id number]                   PATIENT TYPE:  INP   LOCATION:  2920                                 FACILITY:  [facility]   PHYSICIAN:  Rollene Rotunda, M.D.                DATE OF BIRTH:  [birth year]   DATE OF PROCEDURE:  02/10/2003  DATE OF DISCHARGE:                              CARDIAC CATHETERIZATION   REASON FOR PRESENTATION:  Evaluate patient with acute coronary syndrome  manifesting as unstable angina, transient EKG changes.   PROCEDURE:  Left heart catheterization performed via the right femoral  artery.  The artery was cannulated using anterior wall puncture.  A number 6-  French arterial sheath was inserted via the modified Seldinger technique.  Preformed Judkins and a pigtail catheter were utilized.  The patient  tolerated the procedure well and left the laboratory in stable condition.   RESULTS:   HEMODYNAMICS:  AO 158/59.   CORONARIES:  Left main had an 80% calcified lesion.   The LAD was severely diffusely diseased before being occluded in the  proximal segment after septal perforators.  The distal vessel was seen to  fill via the LIMA.   The circumflex was occluded in the mid AV groove proximal to the takeoff of  a large mid obtuse marginal.  There were two small obtuse marginals that  came off before the total occlusion in the circumflex.  The mid obtuse  marginal was seen to perfuse via vein graft.   The right coronary artery was a dominant vessel.  It was occluded  proximally.  There was collateral flow scantily filling an acute marginal.  The PDA was seen to perfuse via vein grafts.   GRAFTS:  LIMA to the LAD was widely patent.   SVG to a diagonal was widely patent.   SVG to mid obtuse marginal had a proximal focal 95% stenosis followed by a  long mid 99% stenosis.   The saphenous vein graft to the right coronary artery had a proximal 60%  followed by 99%  stenosis.   LV:  The LV was not injected secondary to renal insufficiency.   Of note, there was a 95% ostial vertebral artery stenosis on the left.  This  was noted during the injection of the LIMA.    CONCLUSION:  1. Severe native vessel coronary artery disease.  2. Severe two vessel graft stenosis.   PLAN:  The patient will have staged percutaneous intervention by [name], M.D.                                               Rollene Rotunda, M.D.    JH/MEDQ  D:  02/10/2003  T:  02/10/2003  Job:  [id number]  Corwin Levins, M.D. [facility]   cc:   Corwin Levins, M.D. [facility]

## 2010-12-30 NOTE — Cardiovascular Report (Signed)
[name], [name]                             ACCOUNT NO.:  [id number]   MEDICAL RECORD NO.:  [id number]                   PATIENT TYPE:  INP   LOCATION:  2920                                 FACILITY:  [facility]   PHYSICIAN:  [name], M.D.                   DATE OF BIRTH:  1934-10-25   DATE OF PROCEDURE:  02/10/2003  DATE OF DISCHARGE:                              CARDIAC CATHETERIZATION   PROCEDURES PERFORMED:  1. PTCA and stent placement to the saphenous vein graft to the right     coronary artery using PercuSurge distal protection.   DIAGNOSES:  1. Atherosclerosis saphenous vein bypass graft.  2. Unstable angina.   HISTORY:  The patient is a 76 year old white male with a history of  hypertension, coronary artery disease who presented for surgical  revascularization.  The patient was admitted to [facility] with unstable  angina.  Underwent diagnostic cardiac catheterization by Rollene Rotunda,  M.D. showing native three vessel coronary artery disease and severe stenosis  in two of three saphenous vein grafts with patency of the LIMA graft to the  LAD.  The patient was admitted with unstable angina.  Had dynamic EKG  changes in the inferolateral leads and is referred for percutaneous  intervention to the saphenous vein graft to the right coronary artery with  staged intervention of the saphenous vein graft to the marginal branch left  circumflex artery.   TECHNIQUE:  Informed consent had been obtained.  A 6-French sheath in the  right femoral artery was utilized.  The patient had been pretreated with  Plavix and was given heparin to maintain ACT of greater than 300 seconds.  A  6-French MT2 catheter was then used to engage the vein graft to the right  coronary artery.  This catheter had side holes.  Selective angiography was  performed.  This confirmed the presence of a high grade narrowing of 95% in  the proximal segment of the saphenous vein graft.  There was a mild  disease  distally of 30-40%.  The vein graft then anastomosed to the posterior  descending artery with continuation of the posterolateral branch of the  right coronary artery.  There was TIMI grade 3 flow to the vein graft.  A  0.014 inch PercuSurge wire was advanced in the distal section of the vein  graft and inflated.  Repeat angiography showed occlusion of distal flow.  A  3.0 x 23 mm Hepacoat stent was then introduced, carefully positioned across  the stenosis, and deployed at 14 atmospheres for 30 seconds.  There was a  mild taper in the proximal segment of the stent and a 3.0 x 15 mm Quantum  Maverick balloon was introduced.  This was used to post dilate the distal  section of the stent at 14 atmospheres for 30 seconds and the proximal  segment at 16 atmospheres for  30 seconds.  Aspiration was then performed.  Two passes were made for 20 mL each and the PercuSurge balloon deflated.  The patient did have chest discomfort with ECG changes during balloon  inflations.  These resolved with deflation.  Repeat angiography was then  performed showing excellent result with only mild residual waste in the  proximal segment extent of 10-15%.  No vessel damage and TIMI 3 flow in the  distal vein graft and right coronary artery.  This was deemed acceptable  result.  The guide catheter was then removed and sheath secured in position.  The patient tolerated procedure well and was transferred to the floor in  stable condition.   FINAL RESULTS:  Successful PTCA and stent placement saphenous vein graft to  right coronary artery with reduction of 95% narrowing to 10% with placement  of a 3.0 x 23 mm Hepacoat stent.    ASSESSMENT/PLAN:  The patient is a 76 year old gentleman who presents with  unstable angina and disease in two saphenous vein grafts.  He has undergone  percutaneous intervention of the vein graft to the right coronary artery.  He will be observed carefully in the hospital and undergo  staged  intervention to the vein graft to the left circumflex artery in several  days.                                                [name], M.D.    Melton Alar  D:  02/10/2003  T:  02/10/2003  Job:  [id number]  Corwin Levins, M.D. [facility]   cc:   Corwin Levins, M.D. [facility]

## 2010-12-30 NOTE — Op Note (Signed)
NAMENYKEEM, CITRO                             ACCOUNT NO.:  [id number]   MEDICAL RECORD NO.:  [id number]                   PATIENT TYPE:  INP   LOCATION:  2028                                 FACILITY:  [facility]   PHYSICIAN:  [name], M.D.                   DATE OF BIRTH:  1935/08/07   DATE OF PROCEDURE:  06/02/2003  DATE OF DISCHARGE:                                 OPERATIVE REPORT   PROCEDURE:  1. Aortic arch angiogram.  2. Bilateral vertebral angiogram.  3. Bilateral cerebral angiogram.   PREOPERATIVE DIAGNOSES:  1. Cerebrovascular disease.  2. 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   CARDIOLOGIST:  [name], M.D.   INDICATIONS FOR PROCEDURE:  The patient is a 76 year old white male with a  known history of coronary artery disease, status post surgical and  percutaneous revascularization, peripheral vascular disease, with left  femoral-popliteal bypass graft, who presents with worsening right carotid  stenoses by an ultrasound.  In addition, the patient had dizziness and near  syncope in the past.  He presents for an assessment of  the posterior  circulation.   DESCRIPTION OF PROCEDURE:  An informed consent was obtained.  The patient  was brought to the peripheral vascular laboratory.  A 5-French sheath was  placed in the right femoral artery using a modified Seldinger technique.  A  5-French pigtail catheter was advanced in the ascending aorta.  An aortic  arch angiogram was performed using power injections of contrast and digital  subtraction angiography.  Subsequently a JR4 catheter was introduced and  used to engage the right carotid and vertebral arteries.  Again a selective  angiography was performed.  The JR4 catheter was used to engage the left  subclavian artery, and non-selective opacification was performed of the left  vertebral artery due to high-grade ostial stenosis.  Subsequently a Sharol Harness-  1 catheter was used to engage the left common  carotid artery, and selective  angiography was performed of the carotid intercerebral vessels.  At the  termination of the case the catheters and sheaths were removed.  Manual  pressure was applied until adequate hemostasis was achieved.  The patient tolerated the procedure well and was transferred to the floor in  stable condition.  He was neurologically intact.   FINDINGS:  1. The aortic arch is of normal caliber.  There is moderate atheromatous     buildup with calcification.  This appears to be a type 2 arch.  2. The right vertebral artery is a large caliber vessel.  It appears to be     dominant vertebral.  There is an ostial narrowing of 70%.  The posterior     circulation is intact with intact basilar artery.  3. Right carotid artery:  The common carotid artery has mild disease of 30%     at the bifurcation.  There is then  high-grade ostial narrowing of 80% in     the right internal carotid artery with possibly an ulcerated plaque.  The     external carotid artery also has a high-grade narrowing of 80% at its     origin.  The intracranial vessels are intact and have mild disease.  4. Left carotid artery:  The common carotid artery is calcified in its     proximal segment with mild tortuosity at the origin without critical     stenosis.  The bifurcation has moderate disease of 30%-40%.  The     intracranial vessels have mild irregularities.  5. Left vertebral artery:  This vessel has a high-grade narrowing of 90% at     its ostium.  Non-selective opacification of the intracranial vessels     shows intact posterior circulation and communicating artery.   ASSESSMENT/PLAN:  The patient is a 76 year old gentleman with severe right  internal carotid artery stenosis.  He also has bilateral vertebral artery  disease.  A surgical consultation will be obtained to consider whether he is  a candidate for carotid endarterectomy.  Otherwise percutaneous intervention  will be considered.  In  addition, the patient's dizziness and near syncope  will be monitored, and should these symptoms increase in frequency or  severity, a percutaneous intervention of the vertebral arteries will be  considered.                                                    [name], M.D.    NG/MEDQ  D:  06/02/2003  T:  06/02/2003  Job:  [id number]   cc:   [facility]

## 2010-12-30 NOTE — Cardiovascular Report (Signed)
NAMELEVIS, NAZIR                             ACCOUNT NO.:  [id number]   MEDICAL RECORD NO.:  [id number]                   PATIENT TYPE:  INP   LOCATION:  2920                                 FACILITY:  [facility]   PHYSICIAN:  [name], M.D.                   DATE OF BIRTH:  08/08/1935   DATE OF PROCEDURE:  02/13/2003  DATE OF DISCHARGE:                              CARDIAC CATHETERIZATION   PROCEDURES PERFORMED:  1. Percutaneous transluminal coronary angioplasty and stent placement     proximal segment of the saphenous vein graft the left circumflex artery.  2. Percutaneous transluminal coronary angioplasty and stent placement in the     mid section of the saphenous vein graft to the left circumflex artery     using distal protection.   DIAGNOSES:  1. Coronary atherosclerotic disease.  2. Atherosclerosis saphenous vein bypass graft.  3. Unstable angina.   PERFORMING PHYSICIANS:  1. [name], M.D.  2. Salvadore Farber, M.D.  3. Charlies Constable, M.D.   HISTORY:  Mr. Havens is a 76 year old gentleman with a history of surgical  revascularization who presents with unstable angina.  The patient recently  underwent stress imaging study showing scar and significant ischemia in the  inferolateral wall.  The patient was admitted to the hospital with acute  coronary syndrome and underwent cardiac catheterization  and percutaneous  intervention of the saphenous vein graft to the right coronary artery on  February 10, 2003.  He had residual severe disease in the saphenous vein graft  to the left circumflex artery and he presents now for stage intervention.  The patient was enrolled in the [place] 2 trial for distal protection.   TECHNIQUE:  Informed consent had been obtained.  The patient was brought to  the catheterization lab.  A 7 French sheath was placed in the right femoral  artery using the modified Seldinger technique.  The patient had been  pretreated with aspirin and was given  heparin to maintain an ACT of  approximately 300 seconds.  A 7 French AL1 guide catheter with side holes  was used to engage the vein graft to the left circumflex artery and  selective guide shots obtained.  This confirmed presence of a high grade  narrowing of 95% in the proximal segment of the vein graft.  There is  moderate disease of 50% prior to a bend in the vessel and then a long severe  narrowing of 95% in the mid section.  The distal vein graft had mild disease  of 20%.  The anastomosis was free of significant disease.  We elected to  proceed with percutaneous intervention using the EZ filter wire.  This was  introduced and carefully advanced into the distal section of the vein graft  prior to advancement of the filter wire nitro shots were obtained for the  study.  The wire  was then advanced and the filter positioned near the site  of the distal anastomosis.  We elected to proceed with intervention of  proximal lesion first to facilitate stent placement distally and a 3.5 x 13-  mm Cypher stent introduced.  This was carefully positioned in the proximal  lesion and deployed at 14 atmospheres for 40 seconds.  A 3.0 x 24-mm Taxus  stent was then introduced and carefully positioned in the lesion and  deployed at 16 atmospheres for 60 seconds.  Repeat angiography revealed an  excellent result in the proximal lesion with less than 10% residual  narrowing.  There was moderate residual narrowing of 30% in the mid section  of the distal stent.  A  3.25 x 20-mm Quantum Maverick balloon was then introduced and used to post  dilate the distal stent at 16 atmospheres for 35 seconds.  Repeat  angiography now showed an excellent result with less than 10% residual  narrowing in the distal stent.  There was a mild filling defect in the mid  section of the distal stent though to be due to extruded plaque.  However,  this appeared stable.  We elected not to treat this further.  The retrieval   sheath was introduced.  However, some difficulty was noted in advancing this  beyond the distal stent and the angled retrieval sheath utilized to retrieve  the filter wire.  Repeat angiography was then performed after administration  of intracoronary nitroglycerin showing excellent result with less than 10%  residual narrowing at the two lesions and TIMI-3 flow through the vein graft  and distal circumflex artery.  There was no evidence of vessel damage or  distal embolic phenomena.  The guide catheter was then removed.  The sheath  secured in position.  The patient tolerated the procedure well and  transferred to the floor in stable condition.   FINAL RESULTS:  1. Successful primary stent placement in the proximal segment of the     saphenous vein graft to the left circumflex artery with reduction of 95%     narrowing to 10% with placement of 3.5 x 13-mm Cypher stent utilizing     distal filter wire protection.  2. Successful percutaneous transluminal coronary angioplasty and stent     placement in the mid section of the mid section of the saphenous vein     graft to the left circumflex artery with reduction of 95%  narrowing to     10% using a 3.0 x 24-mm Taxus stent dilated to 3.25 mm utilizing filter     wire distal protection.                                                 [name], M.D.    Melton Alar  D:  02/13/2003  T:  02/13/2003  Job:  [id number]  Corwin Levins, M.D. [facility]   [facility]   cc:   Corwin Levins, M.D. [facility]   [facility]

## 2010-12-30 NOTE — Consult Note (Signed)
[name], [name]                   ACCOUNT NO.:  [id number]   MEDICAL RECORD NO.:  [id number]          PATIENT TYPE:  INP   LOCATION:  2113                         FACILITY:  [facility]   PHYSICIAN:  Quita Skye. Hart Rochester, M.D.  DATE OF BIRTH:  01-Aug-1935   DATE OF CONSULTATION:  10/16/2005  DATE OF DISCHARGE:                                   CONSULTATION   CHIEF COMPLAINT:  Left foot cool and numb for five days.   HISTORY OF PRESENT ILLNESS:  This 76 year old patient was admitted through  the emergency department for severe dehydration with a BUN of approximately  130 and creatinine of 12.  The patient had a previous creatinine which is  1.2 in the recent past.  He has had vomiting and diarrhea over the past six  days and five days ago noted that his left leg felt numb in the foot area  but had no rest pain.  He was able to ambulate and states that actually made  the foot feel better.  He has had no symptoms in the right leg.  He is a  patient of Dr. Liliane Bade who performed a left femoral popliteal bypass  graft with 6 mm Gore-Tex in 1998 which occluded in 2000 and required a  thrombectomy at that time and has functioned nicely and as of April 2006,  had an ABI of 1 with palpable pulse in the left foot seen by Dr. Madilyn Fireman in  the office.   PAST MEDICAL HISTORY:  Coronary artery disease status post coronary artery  bypass grafting 1995 and states he has had a myocardial infarction since  that time.  Negative for stroke.   PAST SURGICAL HISTORY:  Bilateral carotid endarterectomies by Dr. Madilyn Fireman,  coronary artery bypass grafting, left femoral popliteal bypass grafting with  Gore-Tex, thrombectomy left femoral popliteal bypass graft.   PHYSICAL EXAMINATION:  VITAL SIGNS:  Blood pressure 75/50, heart rate 90s.  GENERAL:  The patient is alert and oriented.  NECK:  Supple with 3+ carotid pulses, incisions are well-healed.  NEUROLOGIC EXAM:  Normal.  UPPER EXTREMITY PULSES:  3+ bilaterally.  CHEST:  Clear to auscultation.  ABDOMEN:  Soft, nontender with no masses.  LOWER EXTREMITIES:  Right leg has 3+ femoral pulses, no popliteal or distal  pulses, no ischemia.  Left leg has 1-2+ femoral pulse with no popliteal or  distal pulses.  He does have Doppler flow in the left anterior tibial artery  at the ankle which is dampened and monophasic.  Decreased sensation the left  foot but intact motion and no calf tenderness.   IMPRESSION:  1.  Occluded left femoral popliteal bypass graft secondary to severe      dehydration with chronic ischemia left foot.  2.  Severe dehydration, possibly secondary to nausea, vomiting and diarrhea.   RECOMMENDATIONS:  The patient does not need any treatment of his left  occluded left femoral popliteal graft at this time because of his other more  important medical issues.  Dr. Madilyn Fireman will follow up the patient in the  hospital for further recommendations  and treatment..           ______________________________  Quita Skye Hart Rochester, M.D.     JDL/MEDQ  D:  10/16/2005  T:  10/17/2005  Job:  [id number]

## 2010-12-30 NOTE — Discharge Summary (Signed)
NAMEJOHNWILLIAM, SHEPPERSON                   ACCOUNT NO.:  [id number]   MEDICAL RECORD NO.:  [id number]          PATIENT TYPE:  INP   LOCATION:  6735                         FACILITY:  [facility]   PHYSICIAN:  Rene Paci, M.D. LHCDATE OF BIRTH:  [birth year]   DATE OF ADMISSION:  10/16/2005  DATE OF DISCHARGE:  10/20/2005                                 DISCHARGE SUMMARY   DISCHARGE DIAGNOSES:  1.  Hypovolemic shock in setting of volume depletion.  2.  Acute renal failure.  3.  Acute gastroenteritis.  4.  Left femoral popliteal bypass graft occlusion.  5.  Tobacco abuse.   HISTORY OF PRESENT ILLNESS:  The patient is a 76 year old male who was  admitted on October 16, 2005 with complaints of diarrhea and weakness, which  started on October 10, 2005. The patient reported becoming progressively  more weak and presented to the emergency room on October 16, 2005 with his  wife. He complained of his leg feeling asleep for several days. He was  able to take p.o. medications but was not tolerating fluids. He was noted on  admission to have a creatinine of 14.5 and was admitted for further  evaluation.   PAST MEDICAL HISTORY:  1.  Hypertension.  2.  Peripheral vascular disease status post bilateral fem-pop bypass      grafting with Gore-Tex and thrombectomy, left femoral popliteal bypass      graft.  3.  Bilateral carotid endarterectomies performed by Dr. Madilyn Fireman.  4.  Coronary artery disease status post coronary artery bypass grafting in      1995.  5.  History of myocardial infarction.  6.  Chronic obstructive pulmonary disease.  7.  Hypertension.  8.  Hyperlipidemia.  9.  Bilateral renal artery stenosis with stenting.  10. Tobacco abuse.   HOSPITAL COURSE BY PROBLEM:  #1.  HYPOVOLEMIC SHOCK WITH ACUTE RENAL  FAILURE:  Hydrated secondary to volume depletion for his acute  gastroenteritis. In the emergency room, the patient's blood pressure was as  low as 66/35 with a creatinine of 14.5 and a BUN  of 136. He is also noted to  have a mild leukocytosis with a white count of 12.2. The patient was given  an IV fluid bolus and repeat blood pressure came up to 75/43. A critical  care consultation was obtained and the patient was seen by Dr. Marcos Eke. The  patient's volume was repleted. His creatinine continued to improve as well  as his blood pressure. At time of discharge, the patient's creatinine is  1.9. He is instructed to drink plenty of fluids over the next several days  secondary to auto-diuresis and risk for becoming dehydrated.   #2.  LEFT LOWER EXTREMITY FEMORAL POPLITEAL BYPASS GRAFT:  The patient was  noted to have a pulseless cool left foot on admission and a CVTS evaluation  was obtained and the patient was seen by Dr. Josephina Gip. Any further  intervention by CVTS is deferred secondary to patient's renal function. The  patient was re-evaluated today on October 20, 2005 by Dr. Madilyn Fireman, who plans to  do a thrombectomy on Monday, October 23, 2005, as an outpatient. The patient  is eager to go home and I suspect it is likely secondary to nicotine  withdrawal symptoms. Plan to discharge patient to home with close outpatient  followup.   DISCHARGE MEDICATIONS:  1.  Clonidine 0.1 mg p.o. b.i.d. The patient is to hold until followup with      Dr. Jonny Ruiz.  2.  Crestor 40 mg p.o. daily.  3.  Isosorbide mononitrate 60 mg p.o. daily. The patient instructed to hold      until followup with Dr. Jonny Ruiz.  4.  Toprol XL 100 mg p.o. daily.  5.  Plavix 75 mg p.o. daily.  6.  Norvasc 10 mg p.o. daily.  7.  Aspirin 325 mg p.o. daily.  8.  Pletal 100 mg p.o. b.i.d.  9.  Lisinopril 20 mg p.o. daily. The patient is to hold until followup with      Dr. Jonny Ruiz.   FOLLOW UP:  The patient is scheduled to followup with Dr. [name] on  Wednesday, October 25, 2005 at 3:00 p.m. At this visit, he will need a repeat  B-met to recheck renal status. He will also need followup of his blood  pressure and likely  need to have his previous home blood pressure  medications resumed. His blood pressure at time of discharge on Toprol XL is  135/77. As a result, we will defer resuming other medications for now with  close outpatient followup.   SPECIAL INSTRUCTIONS:  The patient is instructed to go to the emergency room  if he should develop worsening pain to the left leg, weakness, nausea,  vomiting or fever over 101. He is also instructed to quit smoking and drink  plenty of fluids over the next several days to avoid dehydration.      Melissa S. Peggyann Juba, NP      Rene Paci, M.D. [facility]  Electronically Signed    MSO/MEDQ  D:  10/20/2005  T:  10/21/2005  Job:  [id number]   cc:   Corwin Levins, M.D. [facility]  [address]  [place]  [place] [address]   P. Liliane Bade, M.D.  [address]  [place]  [place] [address]

## 2010-12-30 NOTE — H&P (Signed)
NAME:  [name], [name]                             ACCOUNT NO.:  [id number]   MEDICAL RECORD NO.:  [id number]                   PATIENT TYPE:  INP   LOCATION:  NA                                   FACILITY:  [facility]   PHYSICIAN:  Balinda Quails, M.D.                 DATE OF BIRTH:  05/26/1935   DATE OF ADMISSION:  DATE OF DISCHARGE:                                HISTORY & PHYSICAL   PRIMARY CARE PHYSICIAN:  Corwin Levins, M.D. [facility]   CARDIOLOGIST:  [name], M.D.   CHIEF COMPLAINT:  Asymptomatic carotid artery disease.   HISTORY OF PRESENT ILLNESS:  This is a 76 year old male well-known to [facility]  and associates having undergone previous procedures in the past including  left femoral popliteal bypass with Gore-Tex with redo surgery specifically  thrombectomy of the graft in November of 2000 by Dr. Edilia Bo.  He has also  undergone coronary artery bypass grafting in 1995 by Gwenith Daily. Tyrone Sage,  M.D.  He has been followed by Dr. [name] and a carotid Duplex study done at  his office revealed a severe stenosis in the 80% range on the right side.  Additionally, there is a 60 to 79% Duplex stenosis in the left internal  carotid artery disease.  A cerebral arteriogram was performed and this  reveals the following:  The right vertebral artery is a dominant vessel  where there is an ostial narrowing of 70%. The posterior circulation is  intact with intact basilar artery.  The right carotid shows a common carotid  lesion with mild disease of approximately 30% at the bifurcation and then  there is a high grade ostial narrowing of 80% in the right internal carotid  artery with possibly an ulcerated plaque.  The external carotid artery also  has a high grade narrowing of 80% at its origin. The intracranial vessels  are intact and have mild disease.  The left carotid artery is calcified in  its proximal segment with mild tortuosity at the origin without critical  stenosis.  The bifurcation has moderate  disease of 30 to 40% range and the  intracranial vessels have mild irregularities.  The left vertebral artery  has a high grade narrowing of 90% at its ostium.  Due to these findings from  the procedure done by Dr. [name] on June 02, 2003, he was recommended  carotid endarterectomy with possible carotid vertebral anastomosis on the  right side.  Dr. Madilyn Fireman evaluated the patient and his studies and agrees to  proceed with the surgery scheduled at this time for July 14, 2003.  The  patient denies any neurologic symptoms with the exception of some occasional  dizziness.  He denies headaches, nausea, vomiting, seizures, paresthesias,  muscle weakness, speech or swallowing difficulty, vision difficulty, memory  loss, or confusion.   PAST MEDICAL HISTORY:  1. Carotid artery disease as described above.  2. Peripheral vascular occlusive disease.  3. Hypertension.  4. Hypercholesterolemia.  5. Coronary artery disease, status post subendocardial myocardial     infarction.  6. Chronic obstructive pulmonary disease/tobacco abuse.  7. History of renal insufficiency.  8. History of bilateral renal artery stenosis.  9. History of hiatal hernia.   He denies diabetes, congestive heart failure, or thyroid disease.   PAST SURGICAL HISTORY:  Left femoral/popliteal bypass in 1998 by Dr. Madilyn Fireman.  Thrombectomy of the left fem/pop bypass in 2000 by Dr. Edilia Bo.  Stenting  bilateral renal arteries by Dr. [name].  Coronary artery bypass grafting x5  in 1995 by Dr. Tyrone Sage.  PTCA/stenting in 2004, uncertain coronary  arteries.  Appendectomy.  Lumbar disk surgery x2.   CURRENT MEDICATIONS:  1. Crestor 10 mg daily.  2. Pletal 100 mg b.i.d.  3. Clonidine 0.1 mg b.i.d.  4. Protonix 40 mg daily.  5. Toprol XL 50 mg daily.  6. IMDUR 60 mg daily.  7. Plavix 75 mg daily.  8. Enteric-coated aspirin 325 mg daily.   ALLERGIES:  No known drug allergies.   REVIEW OF SYSTEMS:  Unremarkable with the exception  of the history of the  present illness.  He does admit to occasional dyspnea on exertion that he  states has improved since his coronary stenting done this year.   FAMILY HISTORY:  Mother deceased from cancer at age 37.  Father deceased  from heart disease at age 108.  Sister deceased from kidney disease at age  43.  He is married.  He has two children.  He is retired.  He used alcohol  heavy in the past, but quit 10 years ago at the time of his cardiac surgery.  Tobacco use; approximately 1.5 packs per day x50 years, he did quit this  year using the patch.   PHYSICAL EXAMINATION:  VITAL SIGNS:  Blood pressure in the left arm 140/70,  pulse 80 with occasional irregular beats, respirations 12 and unlabored.  GENERAL:  This is a 76 year old white male in no acute distress, alert and  oriented x3.  HEENT:  Normocephalic and atraumatic.  Pupils equal, round, and reactive to  light.  Extraocular movements intact.  Positive arcus senilis.  Pharynx is  clear without exudates, erythema, or lesions.  The patient has one tooth.  NECK:  Supple.  No jugular venous distention.  There are bilateral carotid  bruits.  Right is louder than left.  No lymphadenopathy.  CHEST:  Symmetrical on inspiration.  There is mild rhonchi in his lower  fields that do clear with cough.  There is no rales, no lymphadenopathy.  HEART:  Slightly irregular.  There is a 1/6 systolic mitral murmur, no rubs,  and no gallops.  ABDOMEN:  Soft, nontender, and normal active bowel sounds.  There is an  audible abdominal bruit.  GENITOURINARY:  RECTAL:  Deferred.  EXTREMITIES:  No cyanosis, clubbing, or edema.  No ulcerations.  SKIN:  Warm to the touch.  The pedal pulses are absent and the popliteal  pulse is absent on the right side.  NEUROLOGY:  Nonfocal.  Gait is steady.  Cranial nerves II-XII grossly  intact.  Deep tendon reflexes are intact.  Muscle strength is equal and  intact bilaterally.  ASSESSMENT:  Severe right  internal carotid artery disease with other  findings as noted in the cerebral angiography.   PLAN:  Right carotid endarterectomy with possible vertebral carotid  anastomosis per Dr. Madilyn Fireman.      Wayne E.  Doylene Canning, P.A.-C.                    Balinda Quails, M.D.    Sherryll Burger  D:  07/07/2003  T:  07/07/2003  Job:  [id number]

## 2010-12-30 NOTE — Discharge Summary (Signed)
[name], [name]                             ACCOUNT NO.:  [id number]   MEDICAL RECORD NO.:  [id number]                   PATIENT TYPE:  INP   LOCATION:  6742                                 FACILITY:  [facility]   PHYSICIAN:  [name], M.D.                   DATE OF BIRTH:  1935/06/16   DATE OF ADMISSION:  02/25/2003  DATE OF DISCHARGE:  02/27/2003                           DISCHARGE SUMMARY - REFERRING   PROCEDURE:  IV hydration.   HOSPITAL COURSE:  [name] is a 76 year old male with known coronary artery  disease and vascular disease.  He had recent PTCA, and had an office visit  at which time labs were drawn.  His BUN and creatinine were significantly  elevated at 30/4 when they had been 12/1.8.  It was felt that he had  contrast nephropathy and was admitted for hydration and medication  adjustment with a renal consult, but he did not improve.   [name] has an ejection fraction of 55 to 65%, and was tolerating IV  fluids very well.  His medications were reviewed, and the Cozaar and HCTZ  were discontinued.  His creatinine was trending down and was 2.4 on February 26, 2003, and down to 1.8, which is his baseline on February 27, 2003.  His BUN also  came down from 30 to 16 at discharge.  He was encouraged to take p.o. fluids  and with the diuretics being discontinued, it was felt stable.   [name] had been on Cardizem CD 240 mg daily, and Toprol XL 25 mg daily.  [name] reports some orthostatic dizziness, but no presyncope, and he  states that the episodes of dizziness are always very brief and not  significantly affecting his lifestyle.  He has had no falls.  While he was  in the hospital on the monitor it was noted that at times his heart rate  would drop to into the 30s.  This was a sinus bradycardia.  The strips were  reviewed and it was felt this was possibly secondary to medications.  The  Cartia XT was discontinued and the Toprol XL was decreased from 50 to 25 mg  daily.  On February 27, 2003, his blood pressure was up slightly between 130 to  150, and was going up into the 170's at times.  He denied any symptoms from  this.  He had no episodes of dizziness, and his heart rate was in the 60 to  70 range.  He is to be seen in the office next week, and further medications  for blood pressure control or increasing the dose of his beta blocker can be  considered at that time.   By February 27, 2003, [name]'s medications had been adjusted and his  creatinine was at baseline.  He was considered stable for discharge with  outpatient followup next week on  February 27, 2003.   LABORATORY DATA:  Sodium 138, potassium 4.3, chloride 106, CO2 29, BUN 16,  creatinine 1.8, glucose 107.  Hemoglobin 10.9, hematocrit 31.5, WBC 7,  platelets 221.   CONDITION ON DISCHARGE:  Improved.   DISCHARGE DIAGNOSES:  1. Contrast nephropathy, improving.  2. Status post aortocoronary bypass surgery in 1995, with left internal     mammary artery to left anterior descending, saphenous vein graft to     diagonal, saphenous vein graft to obtuse marginal, and saphenous vein     graft to right coronary artery.  3. Preserved left ventricular function with an ejection fraction of 55 to     65% by echocardiogram in June 2004.  4. Status post cardiac catheterization in June 2004, that showed severe     native three vessel disease and patent grafts, except the saphenous vein     graft to right coronary artery had a 99% lesion which was treated with     percutaneous transluminal coronary angioplasty and a Hepacoat stent.  5. Hypertension.  6. Hyperlipidemia.  7. Iron-deficiency anemia with esophagogastroduodenoscopy and biopsy     positive for Helicobacter pylori.  8. Intolerance to NIFEREX secondary to gastrointestinal side effects.  9. Status post percutaneous transluminal coronary angioplasty and stent x2     to the saphenous vein graft to obtuse marginal in June 2004.  10.      Renal  artery stenosis.  11.      History of anticoagulation with Coumadin, discontinued secondary to     gastrointestinal bleed.  12.      Anemia secondary to iron-deficiency and blood loss.  13.      Bradycardia secondary to a combination of calcium channel blocker     and beta blocker.  14.      Tobacco-induced chronic obstructive pulmonary disease.  15.      Peripheral vascular disease, status post femoral/popliteal bypass     on the left and severe superior femoral artery disease on the right.   ACTIVITY:  His activity level is to be as tolerated.   DIET:  He is to stick to a low fat and salt diet.   DISCHARGE INSTRUCTIONS:  1. He is not to use tobacco.  2. He is to get a BMET at his office visit with the P.A. for Dr. [name] on     March 03, 2003, at noon.  3. He is to see Dr. [name] as needed.  4. He will be contacted by _________ for admission on March 12, 2003, for     hydration and peripheral angiogram with possible intervention on his     renal arteries on March 13, 2003.   DISCHARGE MEDICATIONS:  1. Nicotine patch 14 mg daily.  2. Imdur 60 mg daily.  3. Plavix 75 mg daily.  4. Aspirin 81 mg daily.  5. Crestor 10 mg daily.  6. Toprol XL 50 mg 1/2 tab daily.  7. Nitroglycerin p.r.n.   He is not to take Cozaar, HCTZ, or Cartia XT for now.  He has also stopped  Niferex and is to take a vitamin with iron.  Of note, he was discharged in  June on a proton pump inhibitor and has apparently discontinued this on his  own as well.     Lavella Hammock, P.A. [facility]                  [name], M.D.    RG/MEDQ  D:  02/27/2003  T:  02/28/2003  Job:  [id number]   cc:   Corwin Levins, M.D. [facility]   [name], M.D. [facility]    cc:   Corwin Levins, M.D. [facility]   [name], M.D. [facility]

## 2010-12-30 NOTE — Op Note (Signed)
[name], [name]                   ACCOUNT NO.:  [id number]   MEDICAL RECORD NO.:  [id number]          PATIENT TYPE:  INP   LOCATION:  3311                         FACILITY:  [facility]   PHYSICIAN:  Balinda Quails, M.D.    DATE OF BIRTH:  20-Nov-1934   DATE OF PROCEDURE:  05/24/2004  DATE OF DISCHARGE:  05/25/2004                                 OPERATIVE REPORT   PREOPERATIVE DIAGNOSIS:  Severe left internal carotid artery stenosis.   POSTOPERATIVE DIAGNOSIS:  Severe left internal carotid artery stenosis.   PROCEDURE:  Left carotid endarterectomy.   SURGEON:  Balinda Quails, M.D.   ASSISTANT:  Rowe Clack, P.A.-C.   ANESTHESIA:  General endotracheal anesthesia.   INDICATIONS FOR PROCEDURE:  [name] is a 76 year old male with a  history of tobacco abuse and known extracranial cerebrovascular occlusive  disease.  He has previously undergone a right carotid endarterectomy.  He  was seen in the office with new finding of a severe left internal carotid  artery stenosis.  After consultation, it was recommended he undergo a left  carotid endarterectomy for reduction of stroke risk.  The patient consent  for surgery.  Major morbidity, mortality associated with this procedure 1 to  2% to include, but not limited to MI,  CVA, cranial nerve injury and death.   DESCRIPTION OF PROCEDURE:  The patient brought to the operating room in  stable condition.  Placed under general endotracheal anesthesia.  Foley  catheter and arterial line in place.   The left neck was prepped and draped in a sterile fashion.  Curvilinear skin  incision made along the anterior border of the left sternomastoid muscle.  Dissection carried down through the subcutaneous tissue with electrocautery.  Platysma incised.  Deep dissection carried down to expose the carotid  bifurcation.  The facial vein ligated with 3-0 silk and divided.  Common  carotid artery mobilized down to the omohyoid muscle and encircled with a  Vesi-loop.  The superior thyroid and external carotid were freed and  encircled with Vesi-loops.  The internal carotid artery followed distally up  to the posterior belly of the digastric muscle.  The hypoglossal nerve  retracted superiorly.  The distal internal carotid artery encircled with a  Vesi-loop.   Evaluation of the carotid bifurcation revealed extensive calcified plaque  extending 2 to 3 cm into the left internal carotid artery.  The patient  administered 7000 units of heparin intravenously.   Carotid vessels controlled with clamps.  Longitudinal arteriotomy made in  the distal common carotid artery.  There was extensive plaque disease,  carotid bifurcation of the high grade stenosis.  The arteriotomy extended  into the internal carotid artery beyond the plaque.  Shunt was then  inserted.   An endarterectomy elevator used to remove the plaque.  The endarterectomy  carried down into the common carotid artery with plaques divided  transversely with Potts scissors.  Plaque then raised up into the bulb with  superior thyroid and external carotid were endarterectomized using an  eversion technique.  The distal internal carotid  artery plaque then  feathered out.  Fragments of plaque removed with Plaque forceps.   The internal carotid artery was large caliber.  A primary arteriotomy  closure carried out using running 6-0 Prolene suture.  The shunt then  removed.  All vessels well flushed.  Clamps were removed, directing initial  antegrade flow up the external  carotid artery.  The internal carotid artery  then released.  There was an excellent pulse and Doppler signal in the  distal internal carotid artery.   Adequate hemostasis obtained.  The patient administered 50 mg of Protamine  intravenously.  Sponge and instrument counts correct.   Sternomastoid fascia closed with running 2-0 Vicryl suture.  Platysma closed  with running 3-0 Vicryl suture.  Skin closed with 4-0 Monocryl.   Steri-  Strips applied.   Anesthesia was reversed.  The patient awakened readily, moved all  extremities to command.  No apparent complications.  Transferred to the  recovery room in stable condition.       PGH/MEDQ  D:  05/25/2004  T:  05/25/2004  Job:  [id number]   cc:   Corwin Levins, M.D. [facility]   Salvadore Farber, M.D. [facility]  [address]  [address]  [place]  [place] [address]

## 2010-12-30 NOTE — Consult Note (Signed)
[name], EVETT                   ACCOUNT NO.:  [id number]   MEDICAL RECORD NO.:  [id number]          PATIENT TYPE:  INP   LOCATION:  2113                         FACILITY:  [facility]   PHYSICIAN:  [name], M.D.DATE OF BIRTH:  1934/08/21   DATE OF CONSULTATION:  10/17/2005  DATE OF DISCHARGE:                                   CONSULTATION   CONSULTING PHYSICIAN:  [name], M.D.   REFERRING PHYSICIAN:  Rene [name], M.D. [facility].   REASON FOR CONSULTATION:  Acute renal failure.   HISTORY OF PRESENT ILLNESS:  This is a 76 year old white male admitted, on  October 16, 2005, after he presented to the emergency room complaining of one  week's worth of diarrhea and excessive weakness.  He is hypotensive in the  emergency room with systolic blood pressures recorded in the 40s.  Laboratory done in the emergency room revealed a serum creatinine of 12.2  which has improved with hydration to 9.2 as of today.  The only other serum  creatinine I have available is from October 2005, at which time his serum  creatinine was 1.2.  He denies any history of gross hematuria, kidney  stones, or use of nonsteroidal medications.  He does have a history of  bilateral renal artery stenosis and is status post renal artery stenting x2.  His urine output today has improved and is over 2 liters already in the last  two shifts.  Renal ultrasound showed 12-cm kidneys, no hydronephrosis, and  some slight increase echogenicity.  Of note, is the fact that he was on  Lisinopril for his hypertension prior to the admission.   PAST MEDICAL HISTORY:  1.  Hypertension.  2.  Peripheral vascular disease.      1.  Status post bilateral fem-pop.      2.  Status post left carotid endarterectomy.  3.  History of COPD.  4.  History of coronary disease, status post CABG in 1995.  5.  Bilateral renal artery stenosis, status post stenting.   ALLERGIES:  COUMADIN.   CURRENT MEDICATIONS:  1.  Plavix 75 mg  a day.  2.  Lipitor 80 mg a day.  3.  Protonix 40 mg IV every day.   SOCIAL HISTORY:  He is a smoker, greater than 60-pack-year history and  continues to smoke.  Denies any alcohol.  He is married and lives in  [name].   FAMILY HISTORY:  Mother died of cancer at age 46.  Father died of coronary  disease at age 55.  He has one sister, by the name of [name], who had  end-stage renal disease of uncertain etiology but has passed away.   REVIEW OF SYSTEMS:  His diarrhea has resolved.  He does have an appetite.  He denies any shortness of breath.  No chest pains or chest pressures.  No  abdominal pain.  He does complain a little of foot pain for the last week as  well as pain in his left calf.  He denies any dysuria prior to admission.  He currently has a  Foley catheter in place.   PHYSICAL EXAMINATION:  VITAL SIGNS:  Blood pressure is 114/42, pulse is 90,  temperature 98.  GENERAL:  An elderly 76 year old white male in no acute distress.  HEENT:  Stable condition are nonicteric.  Extraocular muscles are intact.  NECK:  Reveals no JVD.  SKIN:  Reveals slight decreased turgor.  LUNGS:  Clear to auscultation.  HEART:  Regular rate and rhythm with a 2 to 3 over 6 systolic murmur at the  apex radiating to the left sternal border.  ABDOMEN:  Positive bowel sounds.  Nontender, nondistended.  No  hepatosplenomegaly.  There are bilateral renal artery bruits.  EXTREMITIES:  No edema.  Left foot reveals cool toes.  There is no overt  cyanosis, though there is some erythema.  He is tender in his left calf  though there is no palpable mass.  His dorsalis pedis, posterior tibial are  no palpable in the left foot.  He does have bilateral femoral bruits.  NEUROLOGIC:  Cranial nerves intact.  No asterixis.  He is oriented x3.   LABORATORY:  Sodium 133, potassium 4.8, chloride 112, bicarb 11, BUN 111,  creatinine 9.2.  Urinalysis reveals 30 mg/dL protein, negative microscopic  exam.    IMPRESSION:  1.  Acute renal failure, most likely secondary to hypotension, volume      depletion while on an ACE inhibitor and there certainly may be a      component of acute tubular necrosis related to hypotension.  2.  Non anion gap metabolic acidosis secondary to diarrhea.  3.  Occluded left femoral-popliteal bypass graft.   RECOMMENDATIONS:  1.  I would increase his bicarb to up 125 cc/hr.  2.  I would avoid the Lisinopril for now, though he could certainly go back      on this if his renal function recovers.  3.  We will obtain daily creatinine.  4.  I spoke with CVTS today and there are tentative plans for surgery      tomorrow for his left leg.  5.  We will try and get old laboratory values from his primary care doctor.   Thank you very much for the consult.  I will follow the patient with you.           ______________________________  [name], M.D.     MTM/MEDQ  D:  10/17/2005  T:  10/18/2005  Job:  [id number]

## 2010-12-30 NOTE — Cardiovascular Report (Signed)
NAMEARDON, FRANKLIN                   ACCOUNT NO.:  [id number]   MEDICAL RECORD NO.:  [id number]          PATIENT TYPE:  INP   LOCATION:  2931                         FACILITY:  [facility]   PHYSICIAN:  Salvadore Farber, M.D. LHCDATE OF BIRTH:  [birth year]   DATE OF PROCEDURE:  05/26/2004  DATE OF DISCHARGE:                              CARDIAC CATHETERIZATION   PROCEDURE:  Left heart catheterization, left ventriculography, coronary and  bypass graft angiography, drug-eluting stent placement in the saphenous vein  graft to the second diagonal branch, drug-eluting stent placement in the  saphenous vein graft to the posterior descending artery.   INDICATION:  Mr. Smock is a 76 year old gentleman with severe coronary and  peripheral vascular disease.  He is status post coronary bypass grafting in  1995 with multiple subsequent interventions including stenting of a vein  graft to the RCA with subsequent restenosis treated with balloon  angioplasty, stenting of the saphenous vein graft to the OM.   Mr. Elzey underwent left carotid endarterectomy two days ago.  He presented  last evening with chest discomfort with labile ST segments.  Chest  discomfort waxed and waned over a period of hours in the emergency room.  He  was then brought urgently to the cardiac catheterization lab with chest pain  that eventually could not be relieved with medical therapy.   PROCEDURAL TECHNIQUE:  Informed consent was obtained.  Under 1% lidocaine  local anesthesia, a 6 French sheath was placed in the right common femoral  artery over a Wholey wire.  There was moderate difficulty in obtaining  access to the right common femoral artery.  Coronary angiography was then  performed using JL4 and JR4 catheters for the native vessels, JR4 for the  vein graft to the OM and vein graft to the PDA, left bypass catheter for the  vein graft to the diagonal, and LIMA catheter for the left internal mammary  artery.   Angiography demonstrated occlusion of the vein graft to the diagonal with  heavy thrombus burden throughout the distal third of the graft.  There was  also 95% instent restenosis in the vein graft to the PDA and PLV.  A  decision was made to proceed with intervention to both grafts.   Anticoagulation was initiated with bivalirudin.  ACT was confirmed to be  greater than 225 seconds.  Sheath was upsized to a 35 cm 7 [place] sheath.  A  7 [place] AL2 catheter was advanced over a wire and engaged in the ostium of  the vein graft to the diagonal.  The lesion was crossed without difficulty  using a Luge wire.  A diver thrombectomy catheter was then employed for two  passes with excellent removal of the thrombus and restoration of TIMI-3  flow.  The lesion was too distal in the vein graft to permit distal  protection.  Therefore, the lesion was stented using a 2.5 x 28 mm CYPHER at  16 atmospheres.  The entirety of the stent was then post dilated using a 2.5  x 20 mm Quantum at 16 atmospheres.  Final  angiography demonstrated no  residual stenosis and TIMI-3 flow to the distal vasculature.   Attention was then turned to the vein graft to the PDA and PLV.  There was a  previously placed 30 x 23 mm Hepacoat.  This had had focal end stent  restenosis treated with balloon angioplasty without brachytherapy in  December, 2004.  He had recurred at that same site.  The AL2 guide was used  to selectively engage the ostium of the vein graft.  A Luge wire was  advanced beyond the lesion without difficulty.  The lesion was directly  stented using a 3.0 x 13 mm CYPHER at 16 atmospheres.  The entirety of the  stent was then post dilated for two successive inflations of 3.25 x 8 mm  power sail at 18 atmospheres.  Final angiography demonstrated no residual  stenosis and TIMI-3 flow to the distal vasculature.   Angiomax was then discontinued.  The patient was transferred to the cardiac  care unit in stable  condition having tolerated the procedure well.   COMPLICATIONS:  None.   FINDINGS:  1.  LV:  160/8/22.  Ejection fraction 50% with akinesis of the midportion of      the anterior wall.  2.  Widely patent bilateral renal stents with no significant end stent      restenosis.  3.  Left main:  Diffusely diseased vessel with 70% diffuse disease.  4.  LAD:  Vessel is a moderate sized vessel giving rise to two diagonals.      It is occluded after the takeoff of the first diagonal.  There is a 90%      stenosis before the takeoff of this diagonal.  This is a moderate sized      vessel and this disease is stable compared with previous.  The LIMA to      the LAD is widely patent.  The left subclavian is without significant      stenosis.  There is excellent distal runoff to the distal LAD territory.  5.  The saphenous vein graft to the second diagonal branch was occluded with      heavy thrombus burden.  TIMI-3 flow was restored with placement of a      drug-eluting stent as described above.  6.  Circumflex:  Relatively large vessel giving rise to two small first      obtuse marginal branches and a large third obtuse marginal.  This third      marginal has an ostial 99% stenosis.  The saphenous vein graft to this      third marginal is patent.  There is a patent stent the proximal portion.      Just distal to this stent is a 60% stenosis which is worsened from      approximately 40% at study December, 2004.  There is excellent distal      runoff.  There was modest collateralization from this vessel to the      right coronary territory prior to revascularization of the vein graft to      the PDA.  7.  RCA:  Large, dominant vessel.  There is an ostial 90% stenosis limiting      flow to two obtuse marginals.  The vessel is occluded after the first      marginal with collateral filling from this marginal to a moderate sized     second marginal.  The sequential vein graft to PDA and PLV has a  previously spaced stent in it.  There was focal restenosis within this      that had recurred despite repeat balloon angioplasty 10 months ago.      This was successfully treated with drug-eluting stent placement with no      residual stenosis and maintenance of TIMI-3 flow.   IMPRESSION/RECOMMENDATION:  1.  Successful drug-eluting stent placement in one culprit lesion of the      saphenous vein graft to the second diagonal branch resulting in      improvement from total occlusion to no residual stenosis and restoration      of TIMI-3 flow.  2.  Successful drug-eluting stent placement in a saphenous vein graft to the      patent ductus arteriosus, reducing stenosis from 95% to no residual.   Patient will be strongly advised to stop smoking.  Given the severe nature  of his native vessel disease, would recommend indefinite therapy with both  Plavix and aspirin.  Statin, beta-blocker and ACE inhibitor will be  continued.      WED/MEDQ  D:  05/26/2004  T:  05/26/2004  Job:  [id number]   cc:   Corwin Levins, M.D. [facility]

## 2010-12-30 NOTE — Discharge Summary (Signed)
[name], [name]                   ACCOUNT NO.:  [id number]   MEDICAL RECORD NO.:  [id number]            PATIENT TYPE:   LOCATION:                                 FACILITY:   PHYSICIAN:  Salvadore Farber, M.D. LHCDATE OF BIRTH:  [birth year]   DATE OF ADMISSION:  05/26/2005  DATE OF DISCHARGE:  05/28/2004                                 DISCHARGE SUMMARY   PROCEDURES:  1.  Cardiac catheterization.  2.  Coronary arteriogram.  3.  Left ventriculogram.  4.  Graft angiogram.   HOSPITAL COURSE:  [name] is a 76 year old male with known coronary artery  disease.  He had 12 hours of chest pain and came to the hospital.  He had  abnormal ST segments and was taken to the catheterization laboratory.   [name] had a CYPHER stent to the vein graft to the PDA and PLA as well as  the vein graft to the diagonal.  He tolerated the procedure well.   [name] was seen by cardiac rehabilitation and followed by them.  He was  also seen for smoking cessation consult and cessation was strongly  encouraged.  His chest x-ray was checked and showed chronic changes, but no  heart failure and no infection.  His medications were adjusted to maintain  his blood pressure.  He did well during his hospital stay and was considered  stable for discharge today.   DISCHARGE INSTRUCTIONS:  His activity level was to be restricted for a week.  He was to stick to a low fat diet.  He is to call for problems with the  catheterization site.  He is to follow up with Dr. Samule Ohm.   DISCHARGE MEDICATIONS:  1.  Lisinopril 20 mg daily.  2.  Nicoderm patch 21 mg daily.  3.  Plavix 75 mg daily.  4.  He is not to take Norvasc.  5.  He is to continue on clonidine, Crestor, Pletal, aspirin, and Toprol as      prior to admission.      RB/MEDQ  D:  12/15/2004  T:  12/15/2004  Job:  [id number]   cc:   Corwin Levins, M.D. [facility]

## 2010-12-30 NOTE — Discharge Summary (Signed)
NAME:  [name], [name]                             ACCOUNT NO.:  [id number]   MEDICAL RECORD NO.:  [id number]                   PATIENT TYPE:  INP   LOCATION:  3704                                 FACILITY:  [facility]   PHYSICIAN:  [name], M.D.                   DATE OF BIRTH:  02-19-1935   DATE OF ADMISSION:  03/23/2003  DATE OF DISCHARGE:  03/26/2003                           DISCHARGE SUMMARY - REFERRING   DISCHARGE DIAGNOSES:  1. Increasing claudication lower extremities, right greater than left.  2. Renal artery stenosis by renal ultrasound January 14, 2003.  3. Status post abdominal aortogram, bilateral lower extremity angiogram,     bilateral selective renal angiogram with stenting of both left and right     renal arteries on March 24, 2003.   SECONDARY DIAGNOSES:  1. History of coronary artery disease, status post coronary artery bypass     graft surgery in 1995.  These grafts were placed:  The left internal     mammary artery to the left anterior descending, a reverse saphenous vein     graft was fashioned from the aorta to the diagonal, a reverse saphenous     vein graft was fashioned from the aorta to the obtuse marginal, a reverse     saphenous vein graft was fashioned from the aorta to the right coronary     artery.  He is also status post percutaneous transluminal coronary     angioplasty/stenting of the saphenous vein graft to the right coronary     artery February 10, 2003.  At the time of beginning assessment for workup of     bilateral renal artery stenosis he developed a contrast neuropathy post     percutaneous transluminal coronary angioplasty/stenting with a creatinine     zenith of 4, which required further admission to [facility] for     intravenous hydration.  He also underwent repeat intervention to the vein     graft to the circumflex system on February 13, 2003, with distal protection.     Ejection fraction estimated at 55-65%.  2. History of contrast  nephropathy.  As mentioned above, status post left     heart catheterization June 2004.  3. History of gastrointestinal bleed at the time of hospitalization June     2004, with finding of CLO positive for Helicobacter pylori.  4. Infrainguinal arterio-occlusive disease as will be described in findings     of angiogram of March 24, 2003.  See below.  5. History of tobacco habituation, only having quit June 2004.  6. Extracranial cerebrovascular occlusive disease.  Doppler ultrasound January 16, 2003, left internal carotid artery 60-69%, right internal carotid     artery greater than 80%.  The patient is asymptomatic.  7. Hypertension, poor control.  8. Dyslipidemia.  9. History of iron deficiency anemia.  PROCEDURE:  This hospitalization, March 24, 2003, abdominal aortogram,  bilateral lower extremity angiogram, bilateral selective renal angiogram,  successful stent placement in the mid left renal artery with reduction of  stenosis 90% to 10%, placement of 6 x 15 Genesis stent.  Also, status post  PTA and stent placement in the proximal segment of the right renal artery  with reduction of 70% stenosis to 10% stenosis with placement of a 6 x 18  Genesis stent.  The abdominal aorta is of normal caliber, with heavy  calcification and severe atherosclerotic disease without critical stenosis  or dilatation.  The right iliac artery has mild disease, 30%.  The right  common femoral artery has a severe stenosis of 70%.  The right superficial  femoral artery is then proximally occluded, reconstitutes in the distal  segment by way of collaterals.  The right popliteal artery has diffuse  disease, 30-40%.  The anterior tibial artery on the right is occluded  proximally.  The posterior tibial artery has severe disease, 80-90%  midpoint.  The peroneal artery on the right has 60% diffuse disease.  Left  lower extremity, left iliac artery has diffuse disease of 30%.  The left  superficial femoral  artery is occluded proximally.  There is a patent left  femoral to popliteal bypass graft which supplies a popliteal artery with  mild disease.  Trifurcation vessels again severely diseased with occlusion  of the anterior and posterior tibial arteries on the left.  The left  peroneal artery is patent with severe disease of 80-90% midsection.   DISCHARGE DISPOSITION:  [name] is ready for discharge postprocedure  [name], March 26, 2003.  He has remained pain-free after the procedure.  His creatinine has not risen in the postprocedure era.  His creatinine on  March 25, 2003, postprocedure [name], was 1.1.  Creatinine on March 26, 2003, postprocedure [name], was 1.1.  There was no evidence of erythema,  swelling, or discharge from the catheterization site.  The patient is not  requiring supplemental oxygen and has not had any cardiac dysrhythmias.  His  blood pressure medications have been adjusted to account for increased  hypertension, which has been poorly controlled.  He will go home on the  following medications:  1. Enteric-coated aspirin 325 mg daily.  2. Plavix 75 mg daily.  3. Imdur 60 mg daily.  4. Crestor 10 mg daily at bedtime.  5. Toprol-XL 50 mg daily, a new dose.  6. Clonidine 0.1 mg p.o. b.i.d., a new medication.  7. Multivitamin with iron daily.  8. Diovan 180 mg daily, a new medication.  9. Also to maintain a nicotine patch.  10.      He is to use Tylenol 325, 1-2 tablets every four to six hours as     needed for pain.   He is to walk daily to preserve his strength.  He is asked not to lift  anything heavy, not to drive, not to engage in sexual intercourse for the  next two days.   DISCHARGE DIET:  Low-sodium, low-cholesterol diet.   He may shower.  He is to call [phone] if he has swelling or increased pain  at his catheterization site.  Dr. Urban Gibson office will call with a follow-up  for two weeks.  HOSPITAL COURSE:  [name] was admitted March 23, 2003, for preprocedure IV  hydration.  He then underwent renal angiogram with carbon dioxide on the  morning of March 24, 2003.  The details of the study have been dictated  above.  He tolerated the procedure well.  He did not have any rise in his  serum creatinine in the two days following the procedure.  He had been  hydrated for 48 hours postprocedure and ready to go home and willing to go  home on March 26, 2003.  It is very possible that he will need a right  femoral to popliteal bypass in the future with an endarterectomy of the  right common femoral artery.  He is to stay on Plavix indefinitely, and new  prescriptions have been written.  A follow-up BMET will be obtained to be  available for Dr. [name] when he sees the patient in two weeks.      Maple Mirza, P.A.                    [name], M.D.    GM/MEDQ  D:  03/26/2003  T:  03/26/2003  Job:  [id number]   cc:   Corwin Levins, M.D. [facility]

## 2011-01-03 ENCOUNTER — Other Ambulatory Visit: Payer: Self-pay | Admitting: Internal Medicine

## 2011-01-13 ENCOUNTER — Ambulatory Visit (INDEPENDENT_AMBULATORY_CARE_PROVIDER_SITE_OTHER): Payer: Medicare Other

## 2011-01-13 DIAGNOSIS — E538 Deficiency of other specified B group vitamins: Secondary | ICD-10-CM

## 2011-01-13 MED ORDER — CYANOCOBALAMIN 1000 MCG/ML IJ SOLN
1000.0000 ug | Freq: Once | INTRAMUSCULAR | Status: AC
  Administered 2011-01-13: 1000 ug via INTRAMUSCULAR

## 2011-01-27 DIAGNOSIS — F411 Generalized anxiety disorder: Secondary | ICD-10-CM

## 2011-01-28 ENCOUNTER — Encounter: Payer: Self-pay | Admitting: Internal Medicine

## 2011-01-29 ENCOUNTER — Encounter: Payer: Self-pay | Admitting: Internal Medicine

## 2011-01-29 DIAGNOSIS — Z0000 Encounter for general adult medical examination without abnormal findings: Secondary | ICD-10-CM | POA: Insufficient documentation

## 2011-01-29 DIAGNOSIS — R7302 Impaired glucose tolerance (oral): Secondary | ICD-10-CM

## 2011-01-29 HISTORY — DX: Impaired glucose tolerance (oral): R73.02

## 2011-01-30 ENCOUNTER — Ambulatory Visit (INDEPENDENT_AMBULATORY_CARE_PROVIDER_SITE_OTHER): Payer: Medicare Other | Admitting: Internal Medicine

## 2011-01-30 ENCOUNTER — Other Ambulatory Visit: Payer: Self-pay | Admitting: Internal Medicine

## 2011-01-30 ENCOUNTER — Other Ambulatory Visit (INDEPENDENT_AMBULATORY_CARE_PROVIDER_SITE_OTHER): Payer: Medicare Other

## 2011-01-30 ENCOUNTER — Encounter: Payer: Self-pay | Admitting: Internal Medicine

## 2011-01-30 DIAGNOSIS — J309 Allergic rhinitis, unspecified: Secondary | ICD-10-CM | POA: Insufficient documentation

## 2011-01-30 DIAGNOSIS — D509 Iron deficiency anemia, unspecified: Secondary | ICD-10-CM

## 2011-01-30 DIAGNOSIS — H6980 Other specified disorders of Eustachian tube, unspecified ear: Secondary | ICD-10-CM

## 2011-01-30 DIAGNOSIS — N320 Bladder-neck obstruction: Secondary | ICD-10-CM | POA: Insufficient documentation

## 2011-01-30 DIAGNOSIS — Z79899 Other long term (current) drug therapy: Secondary | ICD-10-CM

## 2011-01-30 DIAGNOSIS — H543 Unqualified visual loss, both eyes: Secondary | ICD-10-CM

## 2011-01-30 DIAGNOSIS — R7302 Impaired glucose tolerance (oral): Secondary | ICD-10-CM

## 2011-01-30 DIAGNOSIS — H9192 Unspecified hearing loss, left ear: Secondary | ICD-10-CM | POA: Insufficient documentation

## 2011-01-30 DIAGNOSIS — R7309 Other abnormal glucose: Secondary | ICD-10-CM

## 2011-01-30 DIAGNOSIS — H9190 Unspecified hearing loss, unspecified ear: Secondary | ICD-10-CM

## 2011-01-30 DIAGNOSIS — E7800 Pure hypercholesterolemia, unspecified: Secondary | ICD-10-CM

## 2011-01-30 DIAGNOSIS — I2510 Atherosclerotic heart disease of native coronary artery without angina pectoris: Secondary | ICD-10-CM

## 2011-01-30 DIAGNOSIS — I10 Essential (primary) hypertension: Secondary | ICD-10-CM

## 2011-01-30 HISTORY — DX: Unspecified hearing loss, left ear: H91.92

## 2011-01-30 HISTORY — DX: Unqualified visual loss, both eyes: H54.3

## 2011-01-30 LAB — BASIC METABOLIC PANEL
BUN: 12 mg/dL (ref 6–23)
CO2: 28 mEq/L (ref 19–32)
Chloride: 99 mEq/L (ref 96–112)
GFR: 77.6 mL/min (ref >60.00)
Glucose, Bld: 104 mg/dL — ABNORMAL HIGH (ref 70–99)
Potassium: 3.8 mEq/L (ref 3.5–5.1)

## 2011-01-30 LAB — HEPATIC FUNCTION PANEL
ALT: 9 U/L (ref 0–53)
AST: 14 U/L (ref 0–37)
Albumin: 4.2 g/dL (ref 3.5–5.2)
Total Protein: 6.8 g/dL (ref 6.0–8.3)

## 2011-01-30 LAB — CBC WITH DIFFERENTIAL/PLATELET
Basophils Absolute: 0 10*3/uL (ref 0.0–0.1)
Eosinophils Relative: 4.8 % (ref 0.0–5.0)
Lymphocytes Relative: 27.6 % (ref 12.0–46.0)
Monocytes Relative: 7.3 % (ref 3.0–12.0)
Neutrophils Relative %: 59.8 % (ref 43.0–77.0)
Platelets: 176 10*3/uL (ref 150.0–400.0)
WBC: 7.1 10*3/uL (ref 4.5–10.5)

## 2011-01-30 LAB — LIPID PANEL
Cholesterol: 166 mg/dL (ref 0–200)
LDL Cholesterol: 101 mg/dL — ABNORMAL HIGH (ref 0–99)
Triglycerides: 105 mg/dL (ref 0.0–149.0)

## 2011-01-30 LAB — IBC PANEL
Iron: 78 ug/dL (ref 42–165)
Transferrin: 253.1 mg/dL (ref 212.0–360.0)

## 2011-01-30 LAB — PSA: PSA: 1.76 ng/mL (ref 0.10–4.00)

## 2011-01-30 MED ORDER — FEXOFENADINE HCL 180 MG PO TABS: 180.0000 mg | ORAL_TABLET | Freq: Every day | ORAL | Status: DC

## 2011-01-30 MED ORDER — ATORVASTATIN CALCIUM 40 MG PO TABS: 40.0000 mg | ORAL_TABLET | Freq: Every day | ORAL | Status: DC

## 2011-01-30 NOTE — Progress Notes (Signed)
Subjective:    [name], male    DOB: 06/12/1935, 76 y.o.   MRN: [id number]  HPI  Here with decreased hearing for approx 1 wk to left ear, also with some mild sinus congestion with clearish drainage and ear popping, but no pain, vertigo, n/v, or fever.  Pt denies chest pain, increased sob or doe, wheezing, orthopnea, PND, increased LE swelling, palpitations, dizziness or syncope.  Pt denies new neurological symptoms such as new headache, or facial or extremity weakness or numbness   Pt denies polydipsia, polyuria.  Has not yet f/u'd with cardiology after feb 2012 hospn.   Past Medical History  Diagnosis Date  . CEREBROVASCULAR ACCIDENT, HX OF 03/02/2007  . CHEST PAIN 01/31/2010  . CHRONIC OBSTRUCTIVE PULMONARY DISEASE, ACUTE EXACERBATION 02/18/2009  . COPD 03/02/2007  . CORONARY ARTERY DISEASE 03/02/2007  . DEPRESSION 09/23/2007  . DISEASE, PERIPHERAL VASCULAR NEC 03/02/2007  . DIVERTICULOSIS, COLON 09/23/2007  . FATIGUE 01/22/2009  . GERD 03/02/2007  . GLUCOSE INTOLERANCE 09/23/2007  . HEMORRHOIDS 10/09/2007  . HIATAL HERNIA 10/09/2007  . HYPERCHOLESTEROLEMIA 10/09/2007  . HYPERTENSION 09/23/2007  . HYPOKALEMIA 10/09/2007  . MESENTERIC VASCULAR INSUFFICIENCY 07/06/2008  . MITRAL REGURGITATION 11/27/2008  . MI 10/09/2007  . Other specified forms of hearing loss 08/23/2010  . PERIPHERAL VASCULAR DISEASE 09/23/2007  . PUD 10/09/2007  . RENAL ARTERY STENOSIS 03/02/2007  . TOBACCO ABUSE 04/20/2010  . 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 Unspecified Iron Deficiency Anemia 09/23/2007  . URINARY RETENTION 01/22/2009  . VITAMIN B12 DEFICIENCY 01/03/2010  . WRIST PAIN, RIGHT 06/06/2010  . Impaired glucose tolerance 01/29/2011  . Hearing loss in left ear 01/30/2011   Past Surgical History  Procedure Date  . Coronary artery bypass graft 1995    4V  . Renal artery stenting 2004    bilateral  . S/p multiple le vascular bypass     includine fem-pop x 2 LLE  . S/p bilateral cea   . S/p ptca  to the svg to diag, svg to pda and svg to om   . Appendectomy   . S/p lumbar disc surgury     x2  . S/p rle fem bypass feb 2011   . Cardiac catheterization     reports that he has been smoking.  He does not have any smokeless tobacco history on file. He reports that he does not drink alcohol or use illicit drugs. family history includes Cancer in his brother and mother and Heart disease in his father and sister. No Known Allergies Current Outpatient Prescriptions on File Prior to Visit  Medication Sig Dispense Refill  . aspirin 81 MG EC tablet Take 81 mg by mouth daily.        Marland Kitchen atorvastatin (LIPITOR) 20 MG tablet Take 20 mg by mouth daily.        . cilostazol (PLETAL) 100 MG tablet TAKE 1 TABLET TWICE A DAY  60 tablet  2  . clopidogrel (PLAVIX) 75 MG tablet Take 75 mg by mouth daily.        . cyanocobalamin (,VITAMIN B-12,) 1000 MCG/ML injection Inject 1,000 mcg into the muscle every 30 (thirty) days.        . ferrous sulfate 325 (65 FE) MG tablet Take 325 mg by mouth 3 (three) times daily.        . isosorbide mononitrate (IMDUR) 60 MG 24 hr tablet TAKE 1 TABLET EVERY DAY  30 tablet  10  . metoprolol succinate (TOPROL-XL) 25 MG  24 hr tablet 1/2 tablet two times a day       . nitroGLYCERIN (NITROSTAT) 0.4 MG SL tablet Place 0.4 mg under the tongue every 5 (five) minutes as needed.        . pantoprazole (PROTONIX) 40 MG tablet Take 40 mg by mouth 2 (two) times daily with a meal.         Review of Systems Review of Systems  Constitutional: Negative for diaphoresis and unexpected weight change.  HENT: Negative for drooling and tinnitus.   Eyes: Negative for photophobia and visual disturbance.  Respiratory: Negative for choking and stridor.   Gastrointestinal: Negative for vomiting and blood in stool.  Genitourinary: Negative for hematuria and decreased urine volume.        Objective:   Physical Exam BP 170/70  Pulse 75  Temp(Src) 97.8 F (36.6 C) (Oral)  Ht 6' (1.829 m)  Wt 173  lb 6 oz (78.642 kg)  BMI 23.51 kg/m2  SpO2 95% Physical Exam  VS noted Constitutional: Pt appears well-developed and well-nourished.  HENT: Head: Normocephalic.  Right Ear: External ear normal.  Right TM clear, canal clear Left Ear: External ear normal.  left tm cleared of wax, shows mild erythema Eyes: Conjunctivae and EOM are normal. Pupils are equal, round, and reactive to light.  Neck: Normal range of motion. Neck supple.  Cardiovascular: Normal rate and regular rhythm.   Pulmonary/Chest: Effort normal and breath sounds normal.  Abd:  Soft, NT, non-distended, + BS Neurological: Pt is alert. No cranial nerve deficit.  Skin: Skin is warm. No erythema.  Psychiatric: Pt behavior is normal. Thought content normal.         Assessment & Plan:

## 2011-01-30 NOTE — Assessment & Plan Note (Signed)
Mild, for allegra prn,  to f/u any worsening symptoms or concerns 

## 2011-01-30 NOTE — Assessment & Plan Note (Signed)
With severe vascular disease hx;  Goal ldl < 70 - to check lipids, cont diet and current tx

## 2011-01-30 NOTE — Assessment & Plan Note (Signed)
Also due for f/u  - for cbc

## 2011-01-30 NOTE — Assessment & Plan Note (Addendum)
Also for mucinex otc prn,  to f/u any worsening symptoms or concerns  

## 2011-01-30 NOTE — Assessment & Plan Note (Signed)
Asympt, minor in the past, for a1c check

## 2011-01-30 NOTE — Patient Instructions (Addendum)
Your left ear was irrigated today of wax Take all new medications as prescribed - the allegra (sent to [facility]) Continue all other medications as before Please go to LAB in the Basement for the blood and/or urine tests to be done today Please call the phone number [phone] (the PhoneTree System) for results of testing in 2-3 days;  When calling, simply dial the number, and when prompted enter the MRN number above (the Medical Record Number) and the # key, then the message should start. Please check your Blood pressure on a regular basis, and daily for the next 2 wks Please return in 2 wks with your readings, and Please bring your wrist Blood Pressure monitor Please see Dr McAlhany/[facility], as the discharge information recommended that you see him in 2 wks after the Feb 2012 hospitalization (we will refer)

## 2011-01-30 NOTE — Assessment & Plan Note (Signed)
Some improved with irrigation today of wax, but suspect also some eustachian valve dysfxn related to allergies ; for tx as for those as well

## 2011-01-30 NOTE — Assessment & Plan Note (Addendum)
Quite elev today, pt states BP "normal" at home 1 mo ago with wrist monitor;  Will ask pt to cont to monitor closely and return in 2 wks with monitor and readings  BP Readings from Last 3 Encounters:  01/30/11 170/70  10/04/10 142/68  09/23/10 128/52

## 2011-01-30 NOTE — Assessment & Plan Note (Signed)
Also due for PSA - will order

## 2011-01-30 NOTE — Assessment & Plan Note (Signed)
Overdue for f/u with card/dr mcalhany - will refer pt

## 2011-01-31 ENCOUNTER — Other Ambulatory Visit: Payer: Self-pay

## 2011-01-31 MED ORDER — ATORVASTATIN CALCIUM 40 MG PO TABS: 40.0000 mg | ORAL_TABLET | Freq: Every day | ORAL | Status: DC

## 2011-02-07 ENCOUNTER — Encounter: Payer: Self-pay | Admitting: Internal Medicine

## 2011-02-07 ENCOUNTER — Ambulatory Visit (INDEPENDENT_AMBULATORY_CARE_PROVIDER_SITE_OTHER): Payer: Medicare Other | Admitting: Internal Medicine

## 2011-02-07 DIAGNOSIS — H9191 Unspecified hearing loss, right ear: Secondary | ICD-10-CM | POA: Insufficient documentation

## 2011-02-07 DIAGNOSIS — F411 Generalized anxiety disorder: Secondary | ICD-10-CM

## 2011-02-07 DIAGNOSIS — I10 Essential (primary) hypertension: Secondary | ICD-10-CM

## 2011-02-07 DIAGNOSIS — H9190 Unspecified hearing loss, unspecified ear: Secondary | ICD-10-CM

## 2011-02-07 DIAGNOSIS — H9192 Unspecified hearing loss, left ear: Secondary | ICD-10-CM

## 2011-02-07 NOTE — Assessment & Plan Note (Signed)
Unable to clear recently with irrigation - for ENT referral

## 2011-02-07 NOTE — Assessment & Plan Note (Signed)
stable overall by hx and exam, most recent data reviewed with pt, and pt to continue medical treatment as before  Lab Results  Component Value Date   WBC 7.1 01/30/2011   HGB 14.6 01/30/2011   HCT 42.9 01/30/2011   PLT 176.0 01/30/2011   CHOL 166 01/30/2011   TRIG 105.0 01/30/2011   HDL 44.50 01/30/2011   ALT 9 01/30/2011   AST 14 01/30/2011   NA 141 01/30/2011   K 3.8 01/30/2011   CL 99 01/30/2011   CREATININE 1.0 01/30/2011   BUN 12 01/30/2011   CO2 28 01/30/2011   TSH 1.46 01/22/2009   PSA 1.76 01/30/2011   INR 0.90 09/23/2010   HGBA1C 6.4 01/30/2011

## 2011-02-07 NOTE — Patient Instructions (Signed)
You will be contacted regarding the referral for: ENT for the left hearing loss Continue all other medications as before

## 2011-02-07 NOTE — Assessment & Plan Note (Signed)
stable overall by hx and exam, most recent data reviewed with pt, and pt to continue medical treatment as before  BP Readings from Last 3 Encounters:  02/07/11 142/60  01/30/11 170/70  10/04/10 142/68

## 2011-02-07 NOTE — Progress Notes (Signed)
Subjective:    [name], male    DOB: 04/12/1935, 76 y.o.   MRN: [id number]  HPI  Here to f/u - overall improved from last visit with initial left ear improved, but then stopped up again it seems, this time not assoc with ongoing nasal allergy symptoms with clear congestion, itch and sneeze, and without fever, pain, ST, cough or wheezing.  Pt denies new neurological symptoms such as new headache, or facial or extremity weakness or numbness   Pt denies polydipsia, polyuria.  Denies worsening depressive symptoms, suicidal ideation, or panic, though has ongoing anxiety, not increased recently.  Past Medical History  Diagnosis Date  . CEREBROVASCULAR ACCIDENT, HX OF 03/02/2007  . CHEST PAIN 01/31/2010  . CHRONIC OBSTRUCTIVE PULMONARY DISEASE, ACUTE EXACERBATION 02/18/2009  . COPD 03/02/2007  . CORONARY ARTERY DISEASE 03/02/2007  . DEPRESSION 09/23/2007  . DISEASE, PERIPHERAL VASCULAR NEC 03/02/2007  . DIVERTICULOSIS, COLON 09/23/2007  . FATIGUE 01/22/2009  . GERD 03/02/2007  . GLUCOSE INTOLERANCE 09/23/2007  . HEMORRHOIDS 10/09/2007  . HIATAL HERNIA 10/09/2007  . HYPERCHOLESTEROLEMIA 10/09/2007  . HYPERTENSION 09/23/2007  . HYPOKALEMIA 10/09/2007  . MESENTERIC VASCULAR INSUFFICIENCY 07/06/2008  . MITRAL REGURGITATION 11/27/2008  . MI 10/09/2007  . Other specified forms of hearing loss 08/23/2010  . PERIPHERAL VASCULAR DISEASE 09/23/2007  . PUD 10/09/2007  . RENAL ARTERY STENOSIS 03/02/2007  . TOBACCO ABUSE 04/20/2010  . 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 Unspecified Iron Deficiency Anemia 09/23/2007  . URINARY RETENTION 01/22/2009  . VITAMIN B12 DEFICIENCY 01/03/2010  . WRIST PAIN, RIGHT 06/06/2010  . Impaired glucose tolerance 01/29/2011  . Hearing loss in left ear 01/30/2011  . Blindness of both eyes 01/30/2011   Past Surgical History  Procedure Date  . Coronary artery bypass graft 1995    4V  . Renal artery stenting 2004    bilateral  . S/p multiple le vascular bypass    includine fem-pop x 2 LLE  . S/p bilateral cea   . S/p ptca to the svg to diag, svg to pda and svg to om   . Appendectomy   . S/p lumbar disc surgury     x2  . S/p rle fem bypass feb 2011   . Cardiac catheterization     reports that he has been smoking.  He does not have any smokeless tobacco history on file. He reports that he does not drink alcohol or use illicit drugs. family history includes Cancer in his brother and mother and Heart disease in his father and sister. No Known Allergies Current Outpatient Prescriptions on File Prior to Visit  Medication Sig Dispense Refill  . aspirin 81 MG EC tablet Take 81 mg by mouth daily.        Marland Kitchen atorvastatin (LIPITOR) 40 MG tablet Take 1 tablet (40 mg total) by mouth daily.  30 tablet  11  . cilostazol (PLETAL) 100 MG tablet TAKE 1 TABLET TWICE A DAY  60 tablet  2  . clopidogrel (PLAVIX) 75 MG tablet Take 75 mg by mouth daily.        . cyanocobalamin (,VITAMIN B-12,) 1000 MCG/ML injection Inject 1,000 mcg into the muscle every 30 (thirty) days.        . ferrous sulfate 325 (65 FE) MG tablet Take 325 mg by mouth 3 (three) times daily.        . fexofenadine (ALLEGRA) 180 MG tablet Take 1 tablet (180 mg total) by mouth daily.  90 tablet  3  .  isosorbide mononitrate (IMDUR) 60 MG 24 hr tablet TAKE 1 TABLET EVERY DAY  30 tablet  10  . metoprolol succinate (TOPROL-XL) 25 MG 24 hr tablet 1/2 tablet two times a day       . nitroGLYCERIN (NITROSTAT) 0.4 MG SL tablet Place 0.4 mg under the tongue every 5 (five) minutes as needed.        . pantoprazole (PROTONIX) 40 MG tablet Take 40 mg by mouth 2 (two) times daily with a meal.        . DISCONTD: atorvastatin (LIPITOR) 40 MG tablet Take 1 tablet (40 mg total) by mouth daily.  30 tablet  11     Review of Systems Review of Systems  Constitutional: Negative for diaphoresis and unexpected weight change.  HENT: Negative for drooling and tinnitus.   Eyes: Negative for photophobia and visual disturbance.    Respiratory: Negative for choking and stridor.   Gastrointestinal: Negative for vomiting and blood in stool.  Genitourinary: Negative for hematuria and decreased urine volume.      Objective:   Physical Exam BP 142/60  Pulse 71  Temp(Src) 97.6 F (36.4 C) (Oral)  Ht 6' (1.829 m)  Wt 174 lb 8 oz (79.153 kg)  BMI 23.67 kg/m2  SpO2 95% Physical Exam  VS noted Constitutional: Pt appears well-developed and well-nourished.  HENT: Head: Normocephalic.  Right Ear: External ear normal.  Right canal and TM clear Left Ear: External ear normal. Left canal blocked with further mucoid dense mateiral not wax, but no erythema, tender, swelling Eyes: Conjunctivae and EOM are normal. Pupils are equal, round, and reactive to light.  Neck: Normal range of motion. Neck supple.  Cardiovascular: Normal rate and regular rhythm.   Pulmonary/Chest: Effort normal and breath sounds normal.  Neurological: Pt is alert. No cranial nerve deficit.  Skin: Skin is warm. No erythema.  Psychiatric: Pt behavior is normal. Thought content normal.         Assessment & Plan:

## 2011-02-13 ENCOUNTER — Ambulatory Visit: Payer: Medicare Other | Admitting: Internal Medicine

## 2011-02-13 ENCOUNTER — Ambulatory Visit (INDEPENDENT_AMBULATORY_CARE_PROVIDER_SITE_OTHER): Payer: Medicare Other

## 2011-02-13 DIAGNOSIS — E538 Deficiency of other specified B group vitamins: Secondary | ICD-10-CM

## 2011-02-13 MED ORDER — CYANOCOBALAMIN 1000 MCG/ML IJ SOLN
1000.0000 ug | Freq: Once | INTRAMUSCULAR | Status: AC
  Administered 2011-02-13: 1000 ug via INTRAMUSCULAR

## 2011-02-28 ENCOUNTER — Ambulatory Visit: Payer: Medicare Other | Admitting: Internal Medicine

## 2011-03-02 ENCOUNTER — Encounter: Payer: Self-pay | Admitting: Cardiovascular Disease

## 2011-03-02 IMAGING — CR DG WRIST COMPLETE 3+V*R*
2 series · 2 of 2 positions shown · non-contrast
Comparison: None.

CLINICAL DATA: Wrist pain and swelling.

RIGHT WRIST - COMPLETE 3+ VIEW

[view not recorded (1 of 2)]
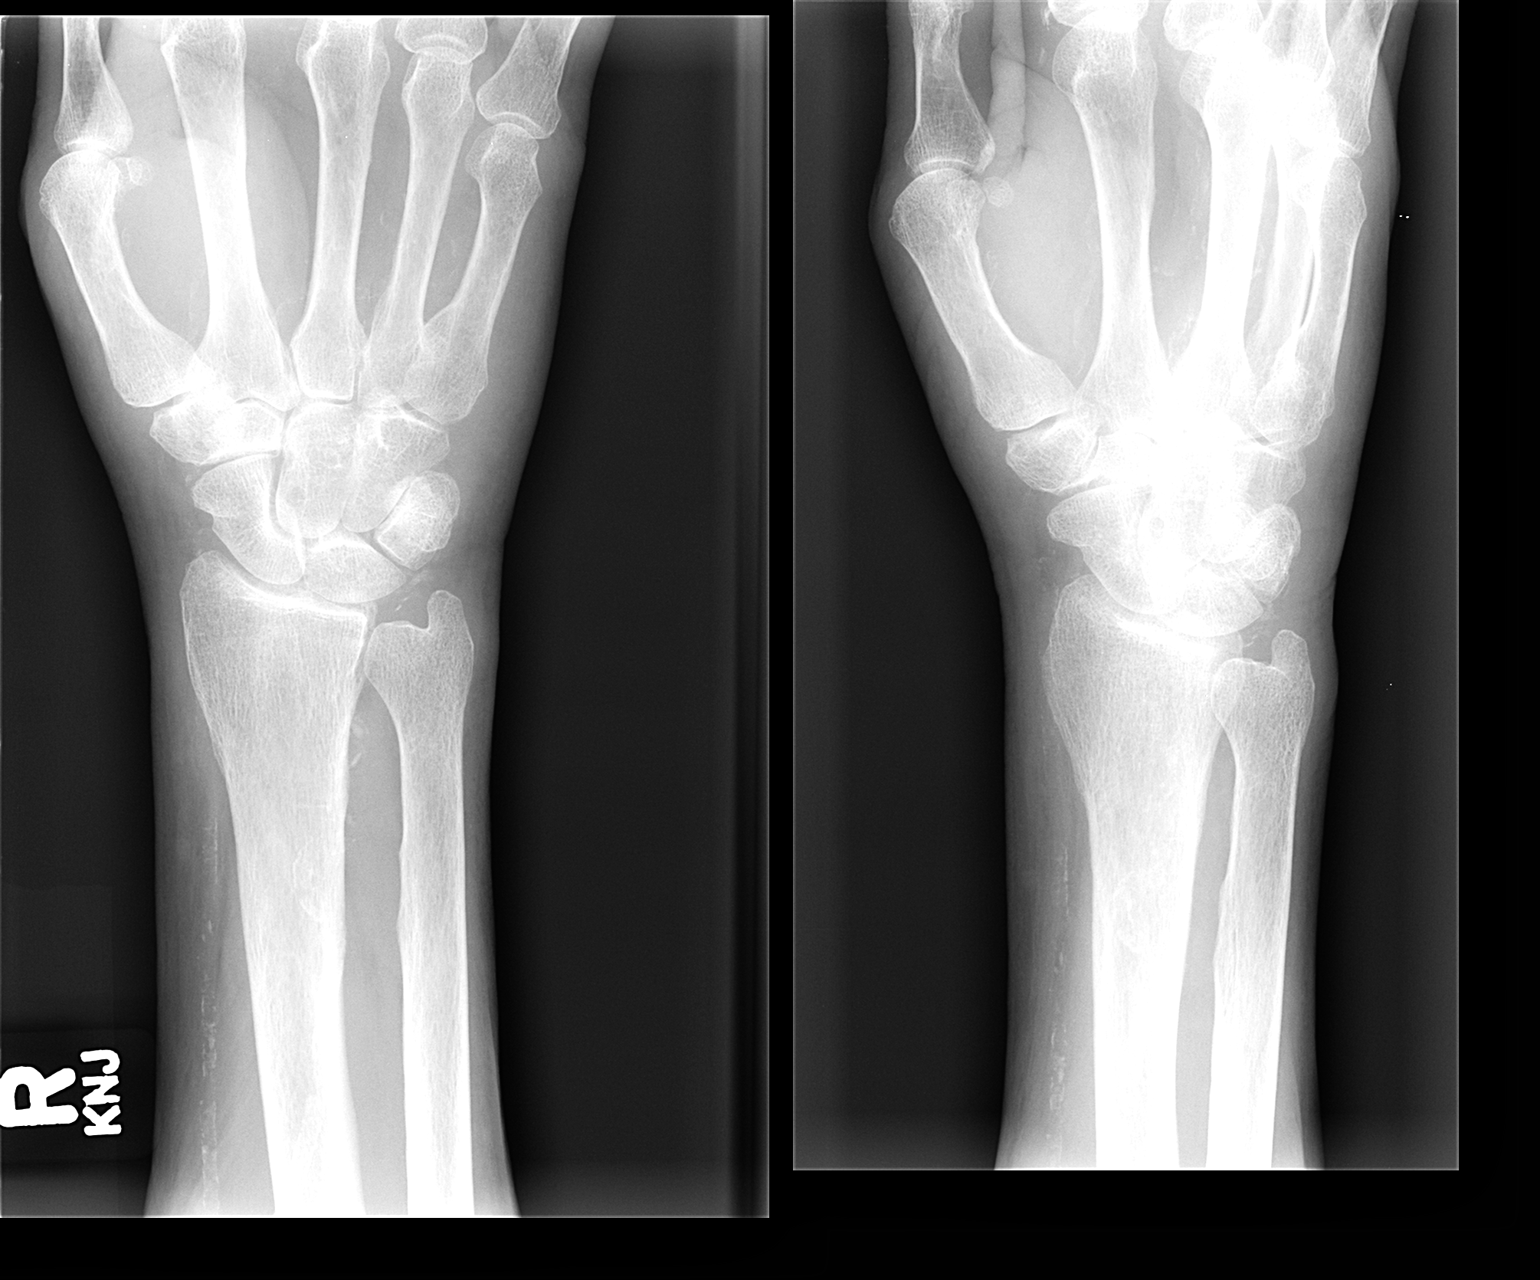

[view not recorded (2 of 2)]
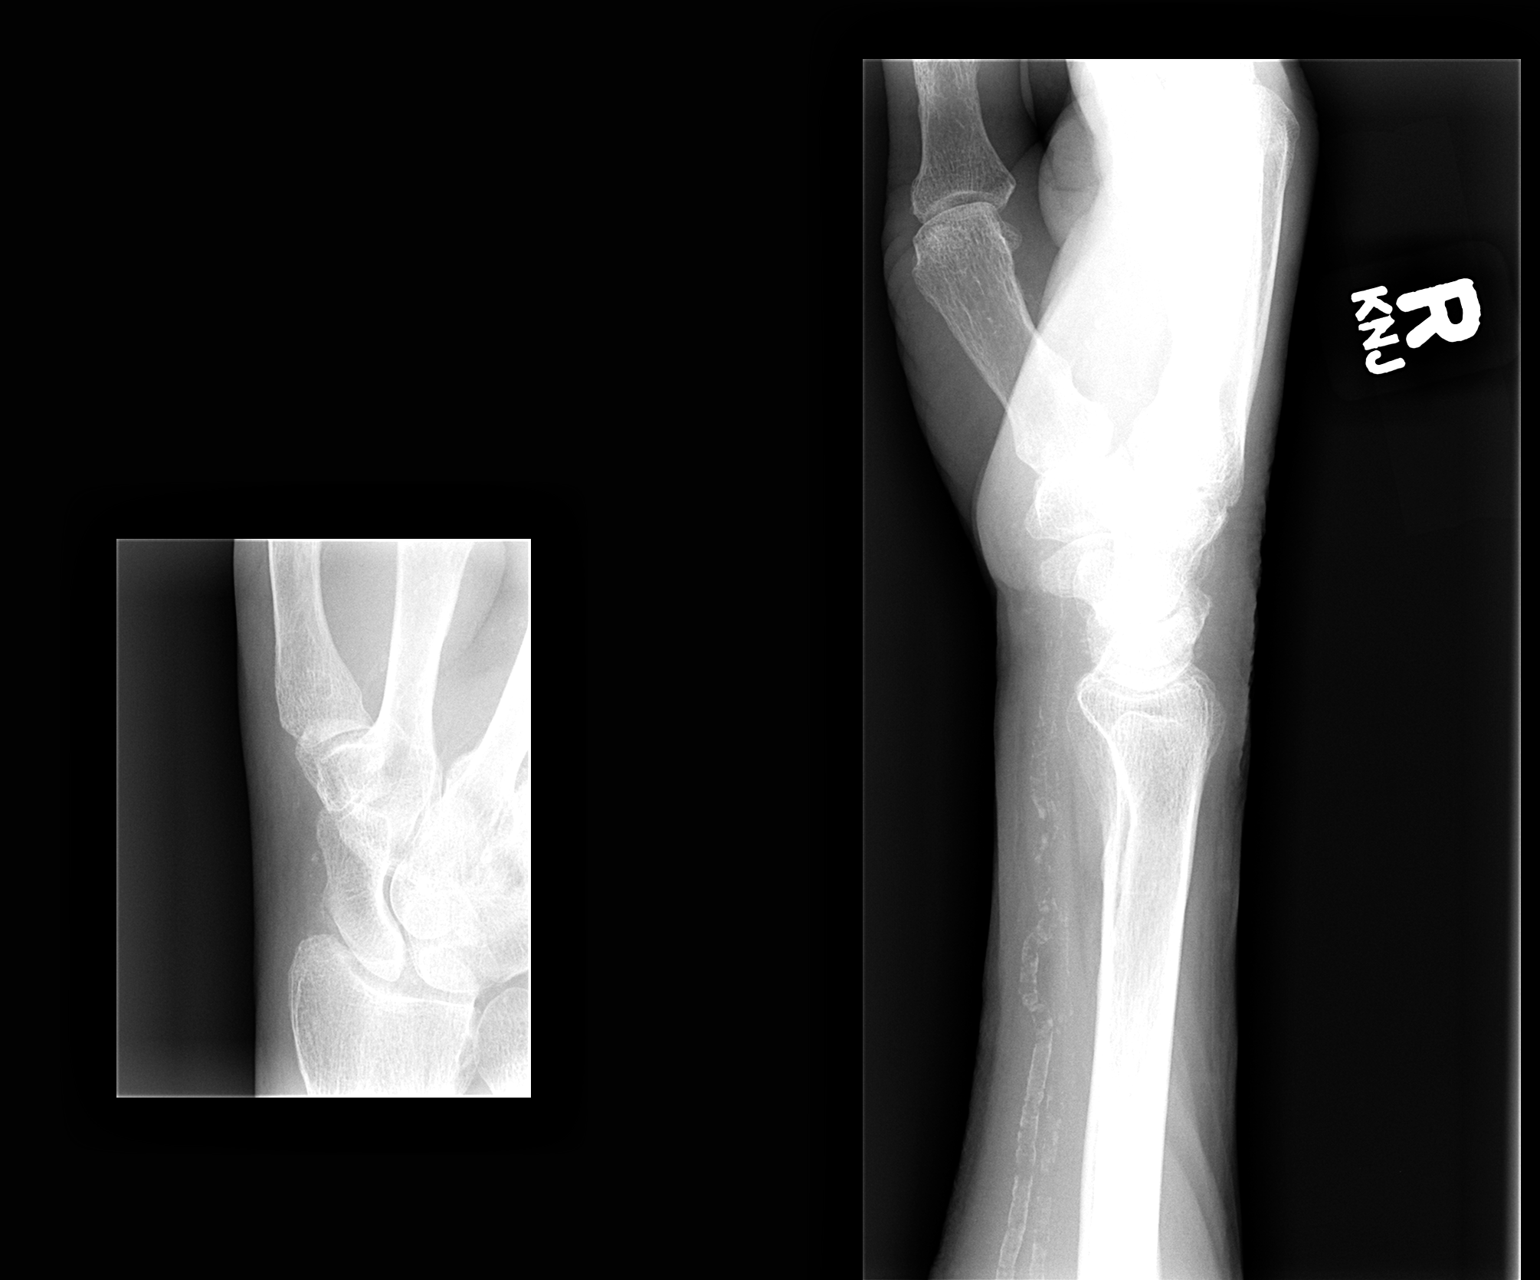

[2 of 2 positions shown; findings below may reference images not displayed]

FINDINGS: No acute bony or joint abnormality is identified.
Chondrocalcinosis of the triangular fibrocartilage is noted.
Atherosclerotic vascular disease is seen.  No marked degenerative
change.
IMPRESSION: 1.  No acute finding.
2.  Chondrocalcinosis.

## 2011-03-21 ENCOUNTER — Ambulatory Visit (INDEPENDENT_AMBULATORY_CARE_PROVIDER_SITE_OTHER): Payer: Medicare Other | Admitting: Internal Medicine

## 2011-03-21 ENCOUNTER — Encounter: Payer: Self-pay | Admitting: Internal Medicine

## 2011-03-21 VITALS — BP 162/70 | HR 68 | Temp 97.6°F | Ht 72.0 in | Wt 175.0 lb

## 2011-03-21 DIAGNOSIS — G609 Hereditary and idiopathic neuropathy, unspecified: Secondary | ICD-10-CM

## 2011-03-21 DIAGNOSIS — D519 Vitamin B12 deficiency anemia, unspecified: Secondary | ICD-10-CM

## 2011-03-21 DIAGNOSIS — J449 Chronic obstructive pulmonary disease, unspecified: Secondary | ICD-10-CM

## 2011-03-21 DIAGNOSIS — D518 Other vitamin B12 deficiency anemias: Secondary | ICD-10-CM

## 2011-03-21 DIAGNOSIS — E538 Deficiency of other specified B group vitamins: Secondary | ICD-10-CM

## 2011-03-21 DIAGNOSIS — G629 Polyneuropathy, unspecified: Secondary | ICD-10-CM

## 2011-03-21 DIAGNOSIS — I10 Essential (primary) hypertension: Secondary | ICD-10-CM

## 2011-03-21 HISTORY — DX: Essential (primary) hypertension: I10

## 2011-03-21 MED ORDER — CYANOCOBALAMIN 1000 MCG/ML IJ SOLN
1000.0000 ug | Freq: Once | INTRAMUSCULAR | Status: AC
  Administered 2011-03-21: 1000 ug via INTRAMUSCULAR

## 2011-03-21 MED ORDER — METOPROLOL TARTRATE 25 MG PO TABS: 25.0000 mg | ORAL_TABLET | Freq: Two times a day (BID) | ORAL | Status: DC

## 2011-03-21 NOTE — Progress Notes (Signed)
Subjective:    [name], male    DOB: [birth year], 76 y.o.   MRN: [id number]  HPI Here to f/u;  Pt denies chest pain, increased sob or doe, wheezing, orthopnea, PND, increased LE swelling, palpitations, dizziness or syncope.  Pt denies new neurological symptoms such as new headache, or facial or extremity weakness or numbness except for persistent chronic feet burning sensations, and  Pt denies polydipsia, polyuria, Pt states overall good compliance with meds, trying to follow lower cholesterol, diabetic diet, wt overall stable but little exercise however.    Here for mobility exam but states he actually has no difficulty with mobility in the home and can perform ADL's ok.  Wants a scooter more for mobility out of the home, and was told he would qualify by the scooter store. Past Medical History  Diagnosis Date  . CEREBROVASCULAR ACCIDENT, HX OF 03/02/2007  . CHEST PAIN 01/31/2010  . CHRONIC OBSTRUCTIVE PULMONARY DISEASE, ACUTE EXACERBATION 02/18/2009  . COPD 03/02/2007  . CORONARY ARTERY DISEASE 03/02/2007  . DEPRESSION 09/23/2007    anxiety  . DISEASE, PERIPHERAL VASCULAR NEC 03/02/2007  . DIVERTICULOSIS, COLON 09/23/2007  . FATIGUE 01/22/2009  . GERD 03/02/2007  . GLUCOSE INTOLERANCE 09/23/2007  . HEMORRHOIDS 10/09/2007  . HIATAL HERNIA 10/09/2007  . HYPERCHOLESTEROLEMIA 10/09/2007  . HYPERTENSION 09/23/2007  . HYPOKALEMIA 10/09/2007  . MESENTERIC VASCULAR INSUFFICIENCY 07/06/2008  . MITRAL REGURGITATION 11/27/2008  . MI 10/09/2007  . Other specified forms of hearing loss 08/23/2010  . PERIPHERAL VASCULAR DISEASE 09/23/2007  . PUD 10/09/2007  . RENAL ARTERY STENOSIS 03/02/2007  . TOBACCO ABUSE 04/20/2010  . 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 Unspecified Iron Deficiency Anemia 09/23/2007  . URINARY RETENTION 01/22/2009  . VITAMIN B12 DEFICIENCY 01/03/2010  . WRIST PAIN, RIGHT 06/06/2010  . Impaired glucose tolerance 01/29/2011  . Hearing loss in left ear 01/30/2011  . Blindness of  both eyes 01/30/2011  . Hyperlipidemia   . Anemia, iron deficiency   . Atrial flutter   . History of colonoscopy   . HTN (hypertension) 03/21/2011   Past Surgical History  Procedure Date  . Coronary artery bypass graft 1995    4V  . Renal artery stenting 2004    bilateral  . S/p multiple le vascular bypass     includine fem-pop x 2 LLE  . S/p bilateral cea   . S/p ptca to the svg to diag, svg to pda and svg to om   . Appendectomy   . S/p lumbar disc surgury     x2  . S/p rle fem bypass feb 2011   . Cardiac catheterization     reports that he has been smoking.  He does not have any smokeless tobacco history on file. He reports that he does not drink alcohol or use illicit drugs. family history includes Cancer in his brother and mother; Heart disease in his father and sister; and Stroke in his sister. No Known Allergies Current Outpatient Prescriptions on File Prior to Visit  Medication Sig Dispense Refill  . aspirin 81 MG EC tablet Take 81 mg by mouth daily.        Marland Kitchen atorvastatin (LIPITOR) 40 MG tablet Take 1 tablet (40 mg total) by mouth daily.  30 tablet  11  . cilostazol (PLETAL) 100 MG tablet TAKE 1 TABLET TWICE A DAY  60 tablet  2  . clopidogrel (PLAVIX) 75 MG tablet Take 75 mg by mouth daily.        Marland Kitchen  cyanocobalamin (,VITAMIN B-12,) 1000 MCG/ML injection Inject 1,000 mcg into the muscle every 30 (thirty) days.        . ferrous sulfate 325 (65 FE) MG tablet Take 325 mg by mouth 3 (three) times daily.        . fexofenadine (ALLEGRA) 180 MG tablet Take 1 tablet (180 mg total) by mouth daily.  90 tablet  3  . isosorbide mononitrate (IMDUR) 60 MG 24 hr tablet TAKE 1 TABLET EVERY DAY  30 tablet  10  . nitroGLYCERIN (NITROSTAT) 0.4 MG SL tablet Place 0.4 mg under the tongue every 5 (five) minutes as needed.        . pantoprazole (PROTONIX) 40 MG tablet Take 40 mg by mouth 2 (two) times daily with a meal.         No current facility-administered medications on file prior to visit.    Review of Systems Review of Systems  Constitutional: Negative for diaphoresis and unexpected weight change.  HENT: Negative for drooling and tinnitus.   Eyes: Negative for photophobia and visual disturbance.  Respiratory: Negative for choking and stridor.   Gastrointestinal: Negative for vomiting and blood in stool.        Objective:   Physical Exam BP 162/70  Pulse 68  Temp(Src) 97.6 F (36.4 C) (Oral)  Ht 6' (1.829 m)  Wt 175 lb (79.379 kg)  BMI 23.73 kg/m2  SpO2 94% Physical Exam  VS noted Constitutional: Pt appears well-developed and well-nourished.  HENT: Head: Normocephalic.  Right Ear: External ear normal.  Left Ear: External ear normal.  Eyes: Conjunctivae and EOM are normal. Pupils are equal, round, and reactive to light.  Neck: Normal range of motion. Neck supple.  Cardiovascular: Normal rate and regular rhythm.   Pulmonary/Chest: Effort normal and breath sounds normal.  Abd:  Soft, NT, non-distended, + BS Neurological: Pt is alert. No cranial nerve deficit.  Skin: Skin is warm. No erythema.  Psychiatric: Pt behavior is normal. Thought content normal.         Assessment & Plan:

## 2011-03-21 NOTE — Patient Instructions (Addendum)
Please increase the metoprolol to 25 mg twice per day (sent to your pharmacy) Your scooter form will be sent in Continue all other medications as before Please return in 6 months, or sooner if needed

## 2011-03-21 NOTE — Assessment & Plan Note (Signed)
Also for b12 shot today as he is due

## 2011-03-21 NOTE — Assessment & Plan Note (Signed)
stable overall by hx and exam, most recent data reviewed with pt, and pt to continue medical treatment as before  SpO2 Readings from Last 3 Encounters:  03/21/11 94%  02/07/11 95%  01/30/11 95%

## 2011-03-21 NOTE — Assessment & Plan Note (Signed)
Mild uncontrolled, to incr the metoprolol to 25 bid,  to f/u any worsening symptoms or concerns  BP Readings from Last 3 Encounters:  03/21/11 162/70  02/07/11 142/60  01/30/11 170/70

## 2011-03-21 NOTE — Assessment & Plan Note (Signed)
Mild, form filled out for scooter store but he does not actually qualify due to his ability to get about in the home and accomplish ADL's on his own

## 2011-03-21 NOTE — Assessment & Plan Note (Addendum)
stable overall by hx and exam, most recent data reviewed with pt, and pt to continue medical treatment as before, to increase the metoprolol to 25 bid BP Readings from Last 3 Encounters:  03/21/11 162/70  02/07/11 142/60  01/30/11 170/70

## 2011-04-03 ENCOUNTER — Other Ambulatory Visit: Payer: Self-pay | Admitting: Internal Medicine

## 2011-04-04 ENCOUNTER — Encounter: Payer: Self-pay | Admitting: Cardiovascular Disease

## 2011-04-04 ENCOUNTER — Ambulatory Visit (INDEPENDENT_AMBULATORY_CARE_PROVIDER_SITE_OTHER): Payer: Medicare Other | Admitting: Cardiovascular Disease

## 2011-04-04 VITALS — BP 182/78 | HR 92 | Ht 72.0 in | Wt 177.0 lb

## 2011-04-04 DIAGNOSIS — F17200 Nicotine dependence, unspecified, uncomplicated: Secondary | ICD-10-CM

## 2011-04-04 DIAGNOSIS — I6529 Occlusion and stenosis of unspecified carotid artery: Secondary | ICD-10-CM

## 2011-04-04 DIAGNOSIS — I739 Peripheral vascular disease, unspecified: Secondary | ICD-10-CM

## 2011-04-04 DIAGNOSIS — I2510 Atherosclerotic heart disease of native coronary artery without angina pectoris: Secondary | ICD-10-CM

## 2011-04-04 NOTE — Assessment & Plan Note (Signed)
Smoking cessation encouraged!

## 2011-04-04 NOTE — Progress Notes (Signed)
History of Present Illness:76 yo WM with pmh of  CAD s/p 4vCABG 1995 and  stenting of 2 saphenous vein grafts, bilateral CEA, mesenteric artery bypass, 2 failed left fem-pop bypasses, renal artery stenting, iliac artery stenting,  right common femoral artery endarterctomy February 2011, HTN, hyperlipidemia and ongoing tobacco abuse here today for follow up. [name] was admitted to [facility] 4/410 with complaints of chest pain. He ruled out for an MI with serial cardiac enzymes. Cardiac cath on November 16, 2008 showed severe triple vessel disease, patent LIIMA to LAD, patent SVG to Diagonal, patent SVG to OM with serial high grade lesions and SVG to PDA with severe in stent restenosis of the body of the vein graft. PCI was performed on 11/20/08 with placement of 2 drug eluting stents in the body of SVG to OM, cutting balloon angioplasty in the segment of in-stent restenosis in the SVG to PDA. He was admittted to [facility] in February 2011 for right femoral artery endarterectomy and right femoral to below the knee popliteal bypass because of a non-healing wound of his right great toe. Dr. Edilia Bo follows his vascular disease. He was readmitted  in May 2011 to [facility] with chest pain. He ruled out for an MI with serial cardiac enzymes. I discussed a cath at that time but his Hgb was low at 7. He also wished to go home and did not want an inpatient workup for his anemia. Since discharge, he has seen GI and had an upper and lower endoscopy. Nothing was found on either endoscopy. Etiology of anemia presumed to be iron deficiency.  I saw him on 09/23/10 and he was c/o chest pain at night. I arranged a left heart cath on 09/27/10. He was found to have severe in-stent restenosis proximal body of SVG to PDA and severe stenosis distal body of SVG to Diagonal. Drug eluting stents were placed in both areas. Post PCI he had black stools and Hbg dropped to 6.0. He was seen by GI. Upper endocscopy showed AVM  stomach and dudoenum as well as pyloric ulcler.  He was transfused and started on a PPI. He has done well since discharge from the hospital. I last saw him in February immediately following his discharge. No chest pain. His breathing has been ok. He has been taking all of his medications. He has no swelling in his legs. Still has claudication when walking. He did not take his BP meds this am. He continues to smoke 1 ppd. Overall feeling well.   Past Medical History  Diagnosis Date  . CEREBROVASCULAR ACCIDENT, HX OF 03/02/2007  . CHEST PAIN 01/31/2010  . CHRONIC OBSTRUCTIVE PULMONARY DISEASE, ACUTE EXACERBATION 02/18/2009  . COPD 03/02/2007  . CORONARY ARTERY DISEASE 03/02/2007  . DEPRESSION 09/23/2007    anxiety  . DISEASE, PERIPHERAL VASCULAR NEC 03/02/2007  . DIVERTICULOSIS, COLON 09/23/2007  . FATIGUE 01/22/2009  . GERD 03/02/2007  . GLUCOSE INTOLERANCE 09/23/2007  . HEMORRHOIDS 10/09/2007  . HIATAL HERNIA 10/09/2007  . HYPERCHOLESTEROLEMIA 10/09/2007  . HYPERTENSION 09/23/2007  . HYPOKALEMIA 10/09/2007  . MESENTERIC VASCULAR INSUFFICIENCY 07/06/2008  . MITRAL REGURGITATION 11/27/2008  . MI 10/09/2007  . Other specified forms of hearing loss 08/23/2010  . PERIPHERAL VASCULAR DISEASE 09/23/2007  . PUD 10/09/2007  . RENAL ARTERY STENOSIS 03/02/2007  . TOBACCO ABUSE 04/20/2010  . 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 Unspecified Iron Deficiency Anemia 09/23/2007  . URINARY RETENTION 01/22/2009  . VITAMIN B12 DEFICIENCY 01/03/2010  .  WRIST PAIN, RIGHT 06/06/2010  . Impaired glucose tolerance 01/29/2011  . Hearing loss in left ear 01/30/2011  . Blindness of both eyes 01/30/2011  . Hyperlipidemia   . Anemia, iron deficiency   . Atrial flutter   . History of colonoscopy   . HTN (hypertension) 03/21/2011    Past Surgical History  Procedure Date  . Coronary artery bypass graft 1995    4V  . Renal artery stenting 2004    bilateral  . S/p multiple le vascular bypass     includine fem-pop x 2 LLE  .  S/p bilateral cea   . S/p ptca to the svg to diag, svg to pda and svg to om   . Appendectomy   . S/p lumbar disc surgury     x2  . S/p rle fem bypass feb 2011   . Cardiac catheterization     Current Outpatient Prescriptions  Medication Sig Dispense Refill  . aspirin 81 MG EC tablet Take 81 mg by mouth daily.        Marland Kitchen atorvastatin (LIPITOR) 40 MG tablet Take 1 tablet (40 mg total) by mouth daily.  30 tablet  11  . cilostazol (PLETAL) 100 MG tablet TAKE 1 TABLET TWICE A DAY  60 tablet  2  . clopidogrel (PLAVIX) 75 MG tablet Take 75 mg by mouth daily.        . cyanocobalamin (,VITAMIN B-12,) 1000 MCG/ML injection Inject 1,000 mcg into the muscle every 30 (thirty) days.        . ferrous sulfate 325 (65 FE) MG tablet Take 325 mg by mouth 3 (three) times daily.        . fexofenadine (ALLEGRA) 180 MG tablet Take 1 tablet (180 mg total) by mouth daily.  90 tablet  3  . isosorbide mononitrate (IMDUR) 60 MG 24 hr tablet TAKE 1 TABLET EVERY DAY  30 tablet  10  . metoprolol tartrate (LOPRESSOR) 25 MG tablet Take 1 tablet (25 mg total) by mouth 2 (two) times daily.  60 tablet  11  . nitroGLYCERIN (NITROSTAT) 0.4 MG SL tablet Place 0.4 mg under the tongue every 5 (five) minutes as needed.        . pantoprazole (PROTONIX) 40 MG tablet Take 40 mg by mouth 2 (two) times daily with a meal.          No Known Allergies  History   Social History  . Marital Status: Married    Spouse Name: N/A    Number of Children: 2  . Years of Education: N/A   Occupational History  . 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 Smoking status: Current Everyday Smoker -- 1.0 packs/day  . Smokeless tobacco: Not on file   Comment: 1 ppd  . Alcohol Use: No  . Drug Use: No  . Sexually Active: Not on file   Other Topics Concern  . Not on file   Social History Narrative   Daily caffeine use 1/2 decaf 1/2 caffeine coffee a day    Family History  Problem Relation Age  of Onset  . Cancer Mother     colon cancer at 71yo  . Heart disease Father   . Heart disease Sister   . Cancer Brother     prostate cancer  . Stroke Sister     Review of Systems:  As stated in the HPI and otherwise negative.   BP 182/78  Pulse 92  Ht 6' (1.829 m)  Wt 177 lb (80.287 kg)  BMI 24.01 kg/m2  Physical Examination: General: Well developed, well nourished, NAD HEENT: OP clear, mucus membranes moist SKIN: warm, dry. No rashes. Neuro: No focal deficits Musculoskeletal: Muscle strength 5/5 all ext Psychiatric: Mood and affect normal Neck: No JVD, bilateral  carotid bruits, no thyromegaly, no lymphadenopathy. Lungs:Clear bilaterally, no wheezes, rhonci, crackles Cardiovascular: Regular rate and rhythm. No murmurs, gallops or rubs. Abdomen:Soft. Bowel sounds present. Non-tender.  Extremities: No lower extremity edema. Pulses are non -palpable  in the bilateral DP/PT.

## 2011-04-04 NOTE — Assessment & Plan Note (Signed)
Stable. His LE disease is followed by Dr. Edilia Bo. Will plan repeat carotid artery dopplers.

## 2011-04-04 NOTE — Assessment & Plan Note (Signed)
Stable. No changes. Lipids in June ok. Continue current meds. Smoking cessation encouraged.

## 2011-04-04 NOTE — Patient Instructions (Signed)
Your physician recommends that you schedule a follow-up appointment in: 6 months  Your physician has requested that you have a carotid duplex. This test is an ultrasound of the carotid arteries in your neck. It looks at blood flow through these arteries that supply the brain with blood. Allow one hour for this exam. There are no restrictions or special instructions.    

## 2011-04-28 ENCOUNTER — Encounter: Payer: Medicare Other | Admitting: *Deleted

## 2011-05-03 LAB — PREPARE PLATELET PHERESIS

## 2011-05-10 ENCOUNTER — Encounter (INDEPENDENT_AMBULATORY_CARE_PROVIDER_SITE_OTHER): Payer: Medicare Other | Admitting: Vascular Surgery

## 2011-05-10 DIAGNOSIS — Z48812 Encounter for surgical aftercare following surgery on the circulatory system: Secondary | ICD-10-CM

## 2011-05-10 DIAGNOSIS — I739 Peripheral vascular disease, unspecified: Secondary | ICD-10-CM

## 2011-05-11 LAB — CBC
HCT: 39
MCV: 95
Platelets: 137 — ABNORMAL LOW
RDW: 13.2

## 2011-05-11 LAB — SEDIMENTATION RATE: Sed Rate: 36 — ABNORMAL HIGH

## 2011-05-11 LAB — DIFFERENTIAL
Basophils Absolute: 0.1
Eosinophils Absolute: 0.4
Eosinophils Relative: 4

## 2011-05-15 ENCOUNTER — Encounter: Payer: Self-pay | Admitting: Physician Assistant

## 2011-05-16 ENCOUNTER — Encounter: Payer: Self-pay | Admitting: Physician Assistant

## 2011-05-17 ENCOUNTER — Ambulatory Visit (INDEPENDENT_AMBULATORY_CARE_PROVIDER_SITE_OTHER): Payer: Medicare Other | Admitting: Physician Assistant

## 2011-05-17 ENCOUNTER — Encounter: Payer: Self-pay | Admitting: Physician Assistant

## 2011-05-17 ENCOUNTER — Emergency Department (HOSPITAL_COMMUNITY)
Admission: EM | Admit: 2011-05-17 | Discharge: 2011-05-17 | Disposition: A | Discharge status: Home / Self Care | Payer: Medicare Other | Attending: Emergency Medicine | Admitting: Emergency Medicine

## 2011-05-17 ENCOUNTER — Emergency Department (HOSPITAL_COMMUNITY)
Admission: EM | Admit: 2011-05-17 | Discharge: 2011-05-17 | Discharge status: Against Medical Advice | Payer: Medicare Other | Attending: Emergency Medicine | Admitting: Emergency Medicine

## 2011-05-17 VITALS — BP 196/96 | HR 81 | Resp 20 | Ht 72.0 in | Wt 179.4 lb

## 2011-05-17 DIAGNOSIS — Z951 Presence of aortocoronary bypass graft: Secondary | ICD-10-CM | POA: Insufficient documentation

## 2011-05-17 DIAGNOSIS — Z0389 Encounter for observation for other suspected diseases and conditions ruled out: Secondary | ICD-10-CM | POA: Insufficient documentation

## 2011-05-17 DIAGNOSIS — Z7901 Long term (current) use of anticoagulants: Secondary | ICD-10-CM | POA: Insufficient documentation

## 2011-05-17 DIAGNOSIS — Z79899 Other long term (current) drug therapy: Secondary | ICD-10-CM | POA: Insufficient documentation

## 2011-05-17 DIAGNOSIS — I70219 Atherosclerosis of native arteries of extremities with intermittent claudication, unspecified extremity: Secondary | ICD-10-CM

## 2011-05-17 DIAGNOSIS — I10 Essential (primary) hypertension: Secondary | ICD-10-CM | POA: Insufficient documentation

## 2011-05-17 DIAGNOSIS — E785 Hyperlipidemia, unspecified: Secondary | ICD-10-CM | POA: Insufficient documentation

## 2011-05-17 NOTE — Progress Notes (Signed)
Subjective:     [name], male   DOB: 1935/04/23, 76 y.o.   MRN: [id number]  HPI  Pt presents for routine eval and f/u of vascular disease with duplex.  Pt has a complicated surgical hx which includes: right fem endarterectomy with profundoplasty and FPBG 10/01/09 by Dr. Edilia Bo; Left FPBG with graft 1998, thrombectomy of left graft 2000, redo left FPBG with graft 2007, and left Fem-AT BG 2010.  He has had a known occlusion of the right bypass since 10/2010.    He is without complaint except intermittent claudication when he walks approx 1 block.  He denies rest pain and wounds.  He is able to carry out his activities of daily living.  Approx 3 weeks ago he sat on a barstool for 3 hours watching a race on TV.  He developed right thigh pain that was sharp and lasted 1 week.  This resolved on its own and he has had no issues since. He states that both of his feet have been numb since his revascularization surgeries.  There has been no change in this.    His BP is noted to be very elevated today at 206/77.  Repeat values were 201/86 and 196/96.  It was checked bilaterally.  He states he takes BP meds BID and did take them this morning.  He has a BP cuff at home but has not been checking it regularly.    He denies headache, CP, SOB, N/V, and TIA/CVA sx.    DUPLEX: Right ABI is 0.60 and left is 0.71.  Bilateral TBI are less 0.30 and considered in the critical range.  Waveforms are monophasic.  The left AT native vessel is occluded at the distal anastamosis of the Fem-AT BG.  There is an elevated velocity of 206 at the proximal Fem-AT graft with the presence of possible valve leaflet.  Left graft is patent. Right grafts were not imaged.     Past Medical History  Diagnosis Date  . CEREBROVASCULAR ACCIDENT, HX OF 03/02/2007  . CHEST PAIN 01/31/2010  . CHRONIC OBSTRUCTIVE PULMONARY DISEASE, ACUTE EXACERBATION 02/18/2009  . COPD 03/02/2007  . CORONARY ARTERY DISEASE 03/02/2007  . DEPRESSION 09/23/2007     anxiety  . DISEASE, PERIPHERAL VASCULAR NEC 03/02/2007  . DIVERTICULOSIS, COLON 09/23/2007  . FATIGUE 01/22/2009  . GERD 03/02/2007  . GLUCOSE INTOLERANCE 09/23/2007  . HEMORRHOIDS 10/09/2007  . HIATAL HERNIA 10/09/2007  . HYPERCHOLESTEROLEMIA 10/09/2007  . HYPERTENSION 09/23/2007  . HYPOKALEMIA 10/09/2007  . MESENTERIC VASCULAR INSUFFICIENCY 07/06/2008  . MITRAL REGURGITATION 11/27/2008  . MI 10/09/2007  . Other specified forms of hearing loss 08/23/2010  . PERIPHERAL VASCULAR DISEASE 09/23/2007  . PUD 10/09/2007  . RENAL ARTERY STENOSIS 03/02/2007  . TOBACCO ABUSE 04/20/2010  . 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 Unspecified Iron Deficiency Anemia 09/23/2007  . URINARY RETENTION 01/22/2009  . VITAMIN B12 DEFICIENCY 01/03/2010  . WRIST PAIN, RIGHT 06/06/2010  . Impaired glucose tolerance 01/29/2011  . Hearing loss in left ear 01/30/2011  . Blindness of both eyes 01/30/2011  . Hyperlipidemia   . Anemia, iron deficiency   . Atrial flutter   . History of colonoscopy   . HTN (hypertension) 03/21/2011  . Peripheral arterial disease    Past Surgical History  Procedure Date  . Coronary artery bypass graft 1995    4V  . Renal artery stenting 2004    bilateral  . S/p multiple le vascular bypass     includine fem-pop  x 2 LLE  . S/p bilateral cea   . S/p ptca to the svg to diag, svg to pda and svg to om   . Appendectomy   . S/p lumbar disc surgury     x2  . S/p rle fem bypass feb 2011   . Cardiac catheterization   . Pr vein bypass graft,aorto-fem-pop   . Carotid endarterectomy      History  Substance Use Topics  . Smoking status: Current Everyday Smoker -- 1.0 packs/day for 60 years    Types: Cigarettes  . Smokeless tobacco: Not on file   Comment: 1 ppd  . Alcohol Use: No    Family History  Problem Relation Age of Onset  . Cancer Mother     colon cancer at 49yo  . Heart disease Father   . Heart disease Sister   . Cancer Brother     prostate cancer  . Stroke Sister      No Known Allergies  Current Outpatient Prescriptions  Medication Sig Dispense Refill  . aspirin 81 MG EC tablet Take 81 mg by mouth daily.        Marland Kitchen atorvastatin (LIPITOR) 20 MG tablet Take 20 mg by mouth 2 (two) times daily.        . cilostazol (PLETAL) 100 MG tablet TAKE 1 TABLET TWICE A DAY  60 tablet  2  . clopidogrel (PLAVIX) 75 MG tablet Take 75 mg by mouth daily.        . cyanocobalamin (,VITAMIN B-12,) 1000 MCG/ML injection Inject 1,000 mcg into the muscle every 30 (thirty) days.        . ferrous sulfate 325 (65 FE) MG tablet Take 325 mg by mouth 3 (three) times daily.        . fexofenadine (ALLEGRA) 180 MG tablet Take 1 tablet (180 mg total) by mouth daily.  90 tablet  3  . isosorbide mononitrate (IMDUR) 60 MG 24 hr tablet TAKE 1 TABLET EVERY DAY  30 tablet  10  . metoprolol tartrate (LOPRESSOR) 25 MG tablet Take 1 tablet (25 mg total) by mouth 2 (two) times daily.  60 tablet  11  . nitroGLYCERIN (NITROSTAT) 0.4 MG SL tablet Place 0.4 mg under the tongue every 5 (five) minutes as needed.        . pantoprazole (PROTONIX) 40 MG tablet Take 40 mg by mouth 2 (two) times daily with a meal.             Review of Systems  Constitutional: Negative.   HENT: Negative for nosebleeds, trouble swallowing, neck pain and neck stiffness.   Eyes: Negative for photophobia and visual disturbance.  Respiratory: Negative for cough, chest tightness and shortness of breath.   Cardiovascular: Negative.   Gastrointestinal: Negative for nausea, vomiting, abdominal pain, diarrhea, constipation and abdominal distention.  Genitourinary: Negative for dysuria, hematuria and difficulty urinating.  Musculoskeletal: Negative for joint swelling and arthralgias.  Skin: Negative.   Neurological: Negative for dizziness, seizures, facial asymmetry, speech difficulty, weakness, light-headedness, numbness and headaches.  Hematological: Negative.   Psychiatric/Behavioral: Negative for hallucinations, behavioral  problems and confusion.       Objective:   Physical Exam  Constitutional: He is oriented to person, place, and time. He appears well-developed and well-nourished.  HENT:  Head: Normocephalic and atraumatic.  Eyes: Conjunctivae and EOM are normal. Pupils are equal, round, and reactive to light.  Neck: Normal range of motion. Neck supple.  Cardiovascular: Normal rate, regular rhythm and normal heart sounds.  Pulmonary/Chest: Effort normal and breath sounds normal.  Abdominal: Soft. Bowel sounds are normal.  Musculoskeletal: Normal range of motion. He exhibits no edema and no tenderness.       Bilateral LE warm and well profused; no wounds  Neurological: He is alert and oriented to person, place, and time.  Skin: Skin is warm and dry.  Psychiatric: He has a normal mood and affect. His behavior is normal.   Filed Vitals:   05/17/11 1626  BP: 196/96  Pulse:   Resp:        Assessment:    Hypertension  Known PAD with bilateral LE bypasses.  Intermittent claudication with no rest pain or wounds and   Elevated velocity in left Fem-AT BG.    Plan:   Advised pt with significant vascular disease and CAD hx to proceed to ED for BP control.  He will f/u in 3 months with repeat bilateral LE duplex and OV with Dr. Edilia Bo.  He also knows to call to be evaled more quickly if he develops rest pain or wounds.  Clinic MD: Edilia Bo

## 2011-05-17 NOTE — Procedures (Unsigned)
BYPASS GRAFT EVALUATION  INDICATION:  Follow up peripheral vascular disease.  HISTORY: Diabetes:  No. Cardiac:  No. Hypertension:  Yes. Smoking:  Currently. Previous Surgery:  Left femoral to anterior tibial bypass graft on 01/09/2011; right femoral to popliteal bypass graft, 10/01/2009.  SINGLE LEVEL ARTERIAL EXAM                              RIGHT              LEFT Brachial: Anterior tibial: Posterior tibial: Peroneal: Ankle/brachial index:  PREVIOUS TBI:  Date: 10/28/2010  RIGHT:  0.31  LEFT:  0.35  LOWER EXTREMITY BYPASS GRAFT DUPLEX EXAM:  DUPLEX: 1. Occluded left anterior tibial artery at the distal anastomosis of     the femoral to tibial bypass graft. 2. Elevated velocity at the proximal femoral to tibial bypass graft     with the presence of a possible valve leaflet, suggestive of     stenosis in the 50% to 75% range.  IMPRESSION: 1. Patent left femoral to tibial bypass graft with stenosis, as noted     above. 2. Occluded left anterior tibial artery, as noted above. 3. Right ankle brachial index of 0.60 and is in the moderate     claudication range. 4. Left ankle brachial index is 0.71 and is in the mild-to-moderate     claudication range. 5. Right toe brachial index of 0.25 and the left toe brachial index is     0.18 with abnormal photoplethysmographic waveforms present. 6. This is an increase in the disease process since study done on     08/03/2010.  ___________________________________________ Di Kindle. Edilia Bo, M.D.  SH/MEDQ  D:  05/10/2011  T:  05/10/2011  Job:  [id number]

## 2011-05-19 ENCOUNTER — Ambulatory Visit (INDEPENDENT_AMBULATORY_CARE_PROVIDER_SITE_OTHER): Payer: Medicare Other | Admitting: Internal Medicine

## 2011-05-19 ENCOUNTER — Encounter: Payer: Self-pay | Admitting: Internal Medicine

## 2011-05-19 VITALS — BP 160/80 | HR 62 | Temp 98.1°F | Ht 72.0 in | Wt 177.1 lb

## 2011-05-19 DIAGNOSIS — R7309 Other abnormal glucose: Secondary | ICD-10-CM

## 2011-05-19 DIAGNOSIS — I7389 Other specified peripheral vascular diseases: Secondary | ICD-10-CM

## 2011-05-19 DIAGNOSIS — R7302 Impaired glucose tolerance (oral): Secondary | ICD-10-CM

## 2011-05-19 DIAGNOSIS — I10 Essential (primary) hypertension: Secondary | ICD-10-CM

## 2011-05-19 IMAGING — CR DG CHEST 2V
3 series · 3 of 3 positions shown · non-contrast
Comparison: 12/29/2009 and earlier.

CLINICAL DATA: 75-year-old male with cough, bronchitis.

CHEST - 2 VIEW

[view not recorded (1 of 3)]
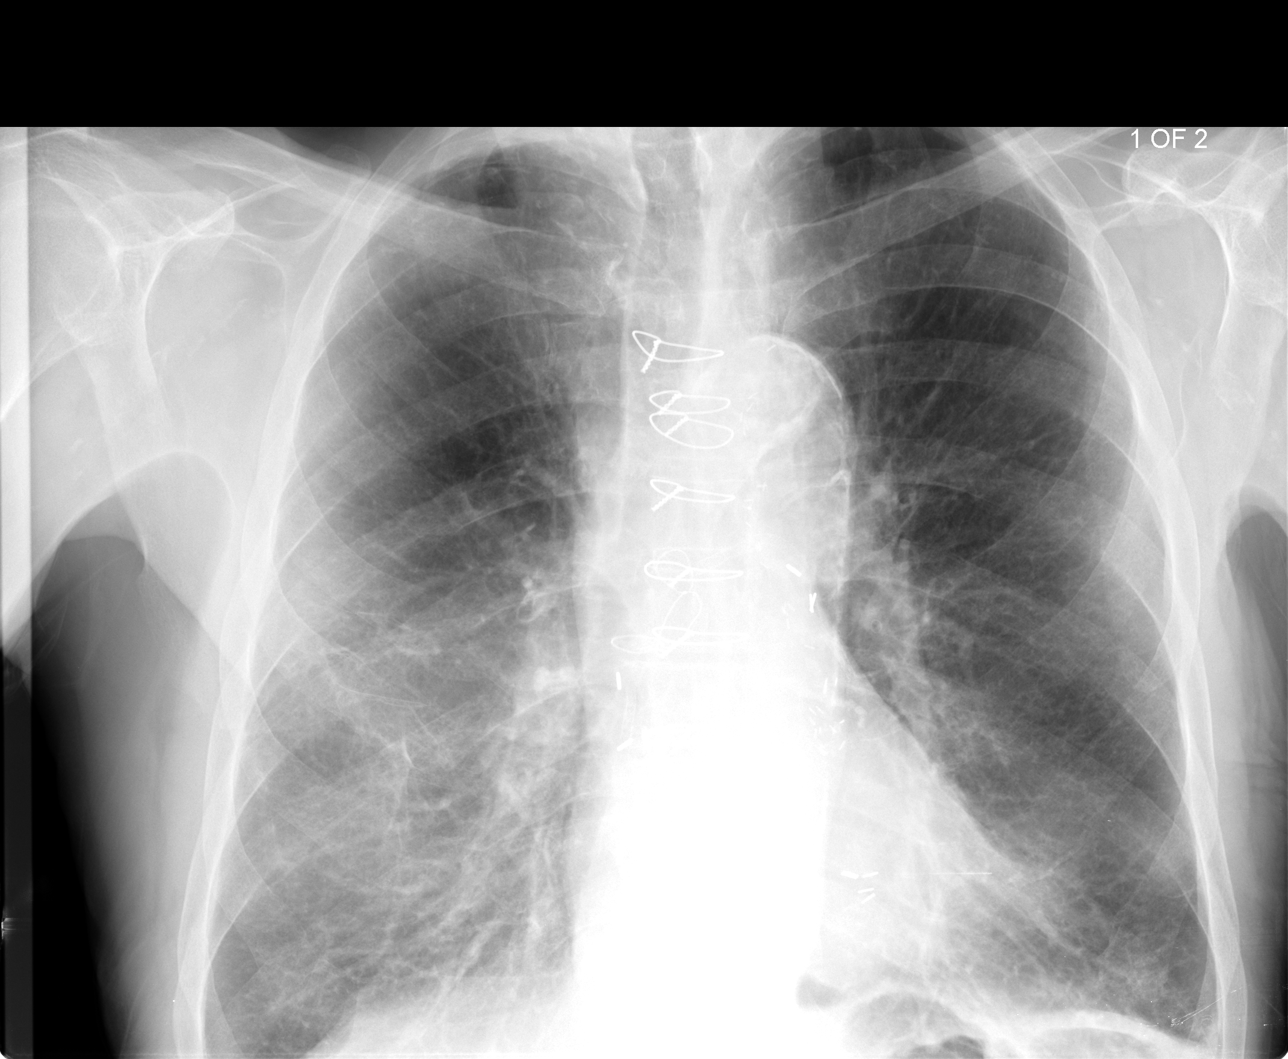

[view not recorded (2 of 3)]
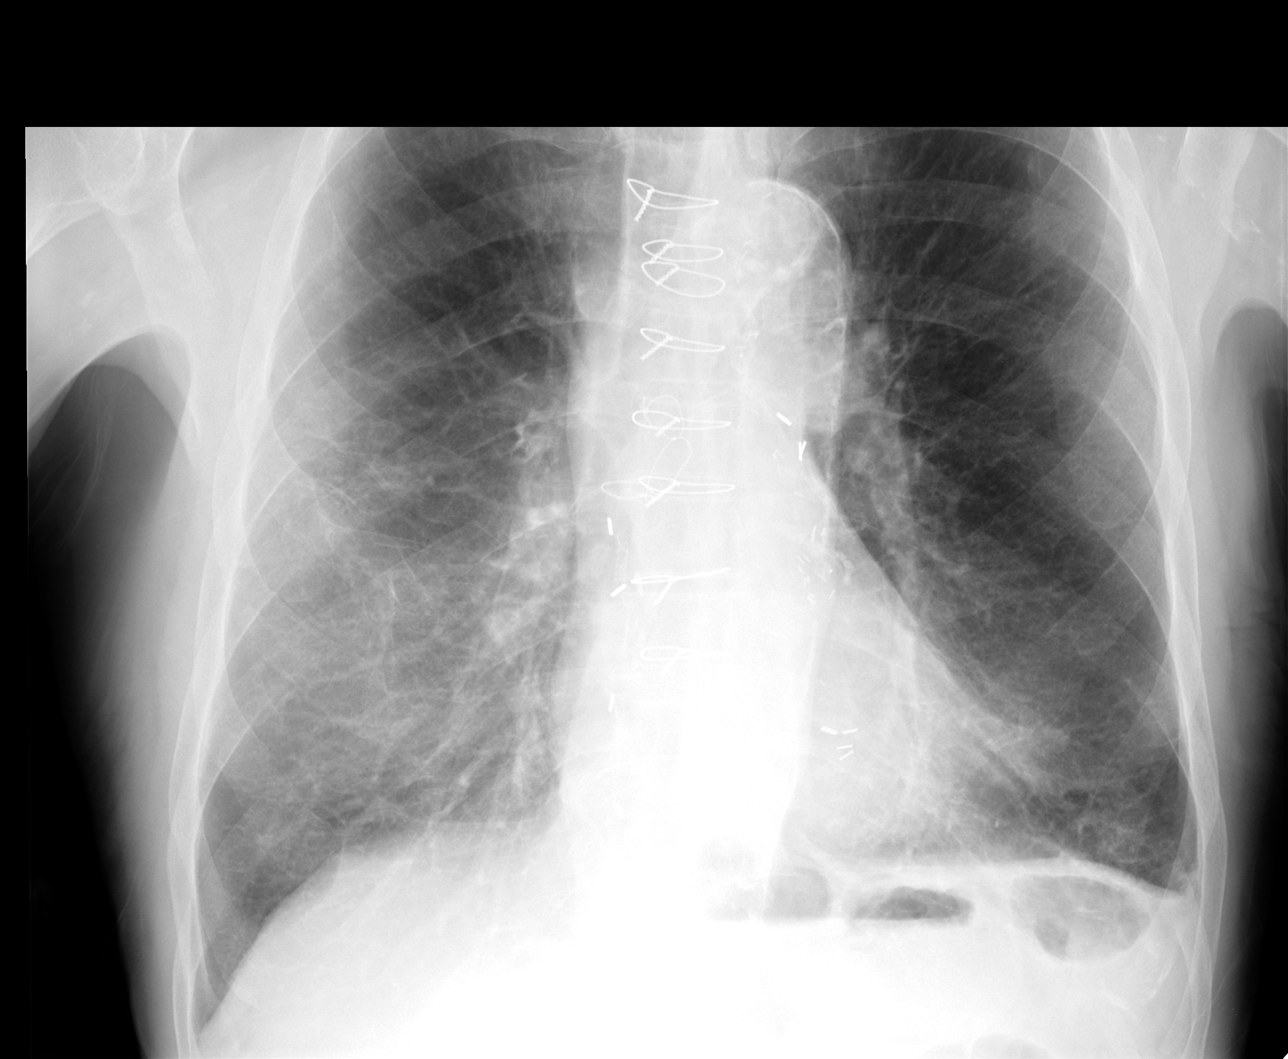

[view not recorded (3 of 3)]
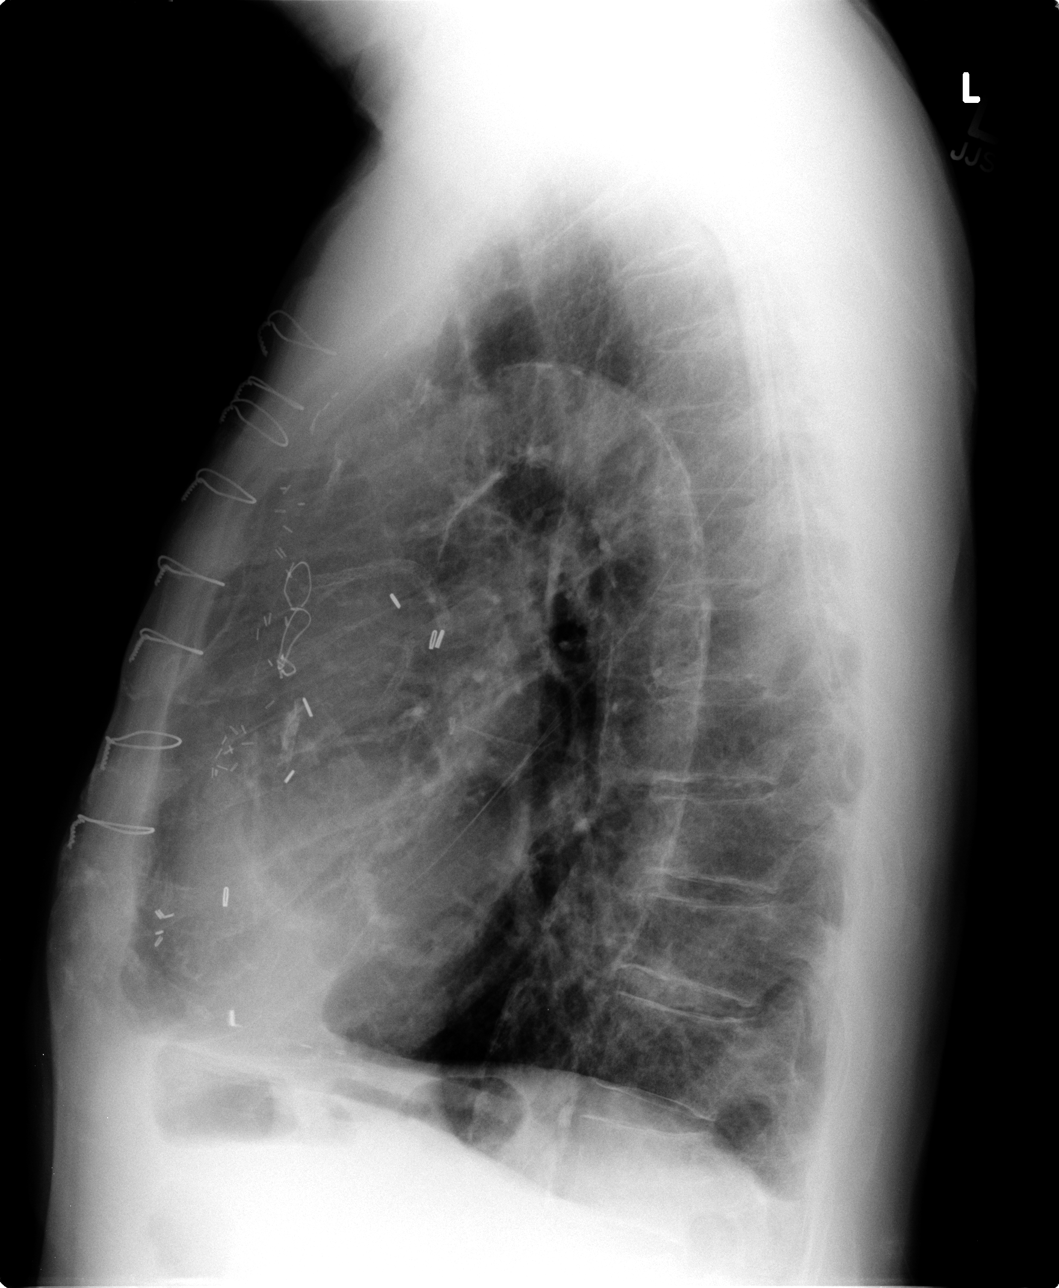

[3 of 3 positions shown; findings below may reference images not displayed]

FINDINGS: Stable large lung volumes.  Stable cardiac size and
mediastinal contours.  Sequelae of CABG.  Diffuse calcified
atherosclerosis of the aorta.  No pneumothorax, pulmonary edema,
pleural effusion, consolidation, or new confluent pulmonary
opacity.  Infrahilar predominant hazy and curvilinear markings are
stable. No acute osseous abnormality identified.
IMPRESSION: Stable chronic lung disease.  No superimposed acute findings are
identified.

## 2011-05-19 MED ORDER — AMLODIPINE BESYLATE 10 MG PO TABS: 10.0000 mg | ORAL_TABLET | Freq: Every day | ORAL | Status: DC

## 2011-05-19 NOTE — Patient Instructions (Addendum)
Take all new medications as prescribed - the amlodipine Continue all other medications as before, includiing the metoprolol at 50 mg twice per day You will be contacted regarding the referral for: carotid artery test, and Dr Clifton James Please return in 6 months, or sooner if needed

## 2011-05-20 ENCOUNTER — Encounter: Payer: Self-pay | Admitting: Internal Medicine

## 2011-05-20 NOTE — Assessment & Plan Note (Signed)
Uncontrolled, to add the amlodipine, Continue all other medications as before,  to f/u any worsening symptoms or concerns, cont to monitor BP at home and next visit

## 2011-05-20 NOTE — Progress Notes (Signed)
Subjective:    [name], male    DOB: 1935/04/08, 76 y.o.   MRN: [id number]  HPI here after marked elev BP in the past wk, seen in the ER after found at home 205/100; metoprolol increased, with BP better today in f/u and tolerating well.  Pt denies chest pain, increased sob or doe, wheezing, orthopnea, PND, increased LE swelling, palpitations, dizziness or syncope.  Pt denies new neurological symptoms such as new headache, or facial or extremity weakness or numbness   Pt denies polydipsia, polyuria.  Pt denies fever, wt loss, night sweats, loss of appetite, or other constitutional symptoms.  Overall good compliance with treatment, and good medicine tolerability.  Denies worsening depressive symptoms, suicidal ideation, or panic, though has ongoing anxiety, not increased recently.   Past Medical History  Diagnosis Date  . CEREBROVASCULAR ACCIDENT, HX OF 03/02/2007  . CHEST PAIN 01/31/2010  . CHRONIC OBSTRUCTIVE PULMONARY DISEASE, ACUTE EXACERBATION 02/18/2009  . COPD 03/02/2007  . CORONARY ARTERY DISEASE 03/02/2007  . DEPRESSION 09/23/2007    anxiety  . DISEASE, PERIPHERAL VASCULAR NEC 03/02/2007  . DIVERTICULOSIS, COLON 09/23/2007  . FATIGUE 01/22/2009  . GERD 03/02/2007  . GLUCOSE INTOLERANCE 09/23/2007  . HEMORRHOIDS 10/09/2007  . HIATAL HERNIA 10/09/2007  . HYPERCHOLESTEROLEMIA 10/09/2007  . HYPERTENSION 09/23/2007  . HYPOKALEMIA 10/09/2007  . MESENTERIC VASCULAR INSUFFICIENCY 07/06/2008  . MITRAL REGURGITATION 11/27/2008  . MI 10/09/2007  . Other specified forms of hearing loss 08/23/2010  . PERIPHERAL VASCULAR DISEASE 09/23/2007  . PUD 10/09/2007  . RENAL ARTERY STENOSIS 03/02/2007  . TOBACCO ABUSE 04/20/2010  . 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 Unspecified Iron Deficiency Anemia 09/23/2007  . URINARY RETENTION 01/22/2009  . VITAMIN B12 DEFICIENCY 01/03/2010  . WRIST PAIN, RIGHT 06/06/2010  . Impaired glucose tolerance 01/29/2011  . Hearing loss in left ear 01/30/2011  .  Blindness of both eyes 01/30/2011  . Hyperlipidemia   . Anemia, iron deficiency   . Atrial flutter   . History of colonoscopy   . HTN (hypertension) 03/21/2011  . Peripheral arterial disease    Past Surgical History  Procedure Date  . Coronary artery bypass graft 1995    4V  . Renal artery stenting 2004    bilateral  . S/p multiple le vascular bypass     includine fem-pop x 2 LLE  . S/p bilateral cea   . S/p ptca to the svg to diag, svg to pda and svg to om   . Appendectomy   . S/p lumbar disc surgury     x2  . S/p rle fem bypass feb 2011   . Cardiac catheterization   . Pr vein bypass graft,aorto-fem-pop   . Carotid endarterectomy     reports that he has been smoking Cigarettes.  He has a 60 pack-year smoking history. He does not have any smokeless tobacco history on file. He reports that he does not drink alcohol or use illicit drugs. family history includes Cancer in his brother and mother; Heart disease in his father and sister; and Stroke in his sister. No Known Allergies Current Outpatient Prescriptions on File Prior to Visit  Medication Sig Dispense Refill  . aspirin 81 MG EC tablet Take 81 mg by mouth daily.        Marland Kitchen atorvastatin (LIPITOR) 20 MG tablet Take 20 mg by mouth 2 (two) times daily.        . cilostazol (PLETAL) 100 MG tablet TAKE 1 TABLET TWICE A DAY  60 tablet  2  . clopidogrel (PLAVIX) 75 MG tablet Take 75 mg by mouth daily.        . cyanocobalamin (,VITAMIN B-12,) 1000 MCG/ML injection Inject 1,000 mcg into the muscle every 30 (thirty) days.        . ferrous sulfate 325 (65 FE) MG tablet Take 325 mg by mouth 3 (three) times daily.        . fexofenadine (ALLEGRA) 180 MG tablet Take 1 tablet (180 mg total) by mouth daily.  90 tablet  3  . isosorbide mononitrate (IMDUR) 60 MG 24 hr tablet TAKE 1 TABLET EVERY DAY  30 tablet  10  . nitroGLYCERIN (NITROSTAT) 0.4 MG SL tablet Place 0.4 mg under the tongue every 5 (five) minutes as needed.        . pantoprazole  (PROTONIX) 40 MG tablet Take 40 mg by mouth 2 (two) times daily with a meal.         Review of Systems Review of Systems  Constitutional: Negative for diaphoresis and unexpected weight change.  HENT: Negative for drooling and tinnitus.   Eyes: Negative for photophobia and visual disturbance.  Respiratory: Negative for choking and stridor.   Gastrointestinal: Negative for vomiting and blood in stool.  Genitourinary: Negative for hematuria and decreased urine volume.    Objective:   Physical Exam BP 160/80  Pulse 62  Temp(Src) 98.1 F (36.7 C) (Oral)  Ht 6' (1.829 m)  Wt 177 lb 2 oz (80.343 kg)  BMI 24.02 kg/m2  SpO2 95% Physical Exam  VS noted Constitutional: Pt appears well-developed and well-nourished.  HENT: Head: Normocephalic.  Right Ear: External ear normal.  Left Ear: External ear normal.  Eyes: Conjunctivae and EOM are normal. Pupils are equal, round, and reactive to light.  Neck: Normal range of motion. Neck supple.  Cardiovascular: Normal rate and regular rhythm.   Pulmonary/Chest: Effort normal and breath sounds normal.  Abd:  Soft, NT, ND, + BS Neurological: Pt is alert. No cranial nerve deficit.  Skin: Skin is warm. No erythema.  Psychiatric: Pt behavior is normal. Thought content normal.     Assessment & Plan:

## 2011-05-20 NOTE — Assessment & Plan Note (Signed)
stable overall by hx and exam, most recent data reviewed with pt, and pt to continue medical treatment as before  Lab Results  Component Value Date   HGBA1C 6.4 01/30/2011

## 2011-05-20 NOTE — Assessment & Plan Note (Signed)
Pt tried to cancel and re-sched the recen carotid dopplers but has not been called back - will help try to arrange, with f/u with cardiology

## 2011-05-23 LAB — CROSSMATCH
ABO/RH(D): B POS
Antibody Screen: POSITIVE
Donor AG Type: NEGATIVE
Donor AG Type: NEGATIVE
Donor AG Type: NEGATIVE
Donor AG Type: NEGATIVE
PT AG Type: NEGATIVE

## 2011-05-23 LAB — BASIC METABOLIC PANEL
BUN: 6
BUN: 8
Chloride: 105
Chloride: 107
Creatinine, Ser: 0.94
Glucose, Bld: 100 — ABNORMAL HIGH
Glucose, Bld: 91
Potassium: 3.7
Potassium: 3.9
Sodium: 139

## 2011-05-23 LAB — CBC
HCT: 23.3 — ABNORMAL LOW
HCT: 32 — ABNORMAL LOW
HCT: 35.1 — ABNORMAL LOW
Hemoglobin: 10.8 — ABNORMAL LOW
MCHC: 33.5
MCHC: 34.3
MCV: 88.5
MCV: 88.9
MCV: 89.3
Platelets: 123 — ABNORMAL LOW
Platelets: 132 — ABNORMAL LOW
Platelets: 142 — ABNORMAL LOW
RDW: 13.9
RDW: 14.1
RDW: 14.3 — ABNORMAL HIGH
WBC: 4.9
WBC: 6.9

## 2011-05-23 LAB — COMPREHENSIVE METABOLIC PANEL
ALT: <8
AST: 10
Calcium: 9
GFR calc Af Amer: >60
Glucose, Bld: 95
Sodium: 139
Total Protein: 5 — ABNORMAL LOW

## 2011-05-23 LAB — PREPARE RBC (CROSSMATCH)

## 2011-05-23 LAB — APTT: aPTT: 27

## 2011-05-23 LAB — PROTIME-INR: Prothrombin Time: 13

## 2011-05-31 ENCOUNTER — Ambulatory Visit: Payer: Medicare Other | Admitting: Vascular Surgery

## 2011-06-05 ENCOUNTER — Encounter (INDEPENDENT_AMBULATORY_CARE_PROVIDER_SITE_OTHER): Payer: Medicare Other | Admitting: *Deleted

## 2011-06-05 DIAGNOSIS — I6529 Occlusion and stenosis of unspecified carotid artery: Secondary | ICD-10-CM

## 2011-06-07 ENCOUNTER — Encounter: Payer: Self-pay | Admitting: Cardiovascular Disease

## 2011-06-07 ENCOUNTER — Ambulatory Visit (INDEPENDENT_AMBULATORY_CARE_PROVIDER_SITE_OTHER): Payer: Medicare Other | Admitting: Cardiovascular Disease

## 2011-06-07 VITALS — BP 162/71 | HR 70 | Ht 72.0 in | Wt 183.0 lb

## 2011-06-07 DIAGNOSIS — I10 Essential (primary) hypertension: Secondary | ICD-10-CM

## 2011-06-07 DIAGNOSIS — I2510 Atherosclerotic heart disease of native coronary artery without angina pectoris: Secondary | ICD-10-CM

## 2011-06-07 DIAGNOSIS — I739 Peripheral vascular disease, unspecified: Secondary | ICD-10-CM | POA: Insufficient documentation

## 2011-06-07 DIAGNOSIS — F17200 Nicotine dependence, unspecified, uncomplicated: Secondary | ICD-10-CM

## 2011-06-07 MED ORDER — METOPROLOL TARTRATE 50 MG PO TABS: 50.0000 mg | ORAL_TABLET | Freq: Two times a day (BID) | ORAL | Status: DC

## 2011-06-07 MED ORDER — LISINOPRIL 10 MG PO TABS: 10.0000 mg | ORAL_TABLET | Freq: Every day | ORAL | Status: DC

## 2011-06-07 NOTE — Assessment & Plan Note (Signed)
BP still elevated. This is likely worsened by his smoking. Will continue current meds and add Lisinopril 10 mg po Qdaily. Will check BMET one week.

## 2011-06-07 NOTE — Assessment & Plan Note (Signed)
Stable. No changes. Continue current therapy.  

## 2011-06-07 NOTE — Assessment & Plan Note (Signed)
Carotid disease stable. Lower ext disease followed by Dr. Durwin Nora with [facility].

## 2011-06-07 NOTE — Progress Notes (Signed)
History of Present Illness:76 yo WM with pmh of CAD s/p 4vCABG 1995 and stenting of 2 saphenous vein grafts, bilateral CEA, mesenteric artery bypass, 2 failed left fem-pop bypasses, renal artery stenting, iliac artery stenting, right common femoral artery endarterctomy February 2011, HTN, hyperlipidemia and ongoing tobacco abuse here today for follow up. [name] was admitted to [facility] 4/410 with complaints of chest pain. He ruled out for an MI with serial cardiac enzymes. Cardiac cath on November 16, 2008 showed severe triple vessel disease, patent LIMA to LAD, patent SVG to Diagonal, patent SVG to OM with serial high grade lesions and SVG to PDA with severe in stent restenosis of the body of the vein graft. PCI was performed on 11/20/08 with placement of 2 drug eluting stents in the body of SVG to OM, cutting balloon angioplasty in the segment of in-stent restenosis in the SVG to PDA. He was admittted to [facility] in February 2011 for right femoral artery endarterectomy and right femoral to below the knee popliteal bypass because of a non-healing wound of his right great toe. Dr. Edilia Bo follows his vascular disease. He was readmitted in May 2011 to [facility] with chest pain. He ruled out for an MI with serial cardiac enzymes. I discussed a cath at that time but his Hgb was low at 7. He also wished to go home and did not want an inpatient workup for his anemia. Since discharge, he has seen GI and had an upper and lower endoscopy. Nothing was found on either endoscopy. Etiology of anemia presumed to be iron deficiency. I saw him on 09/23/10 and he was c/o chest pain at night. I arranged a left heart cath on 09/27/10. He was found to have severe in-stent restenosis proximal body of SVG to PDA and severe stenosis distal body of SVG to Diagonal. Drug eluting stents were placed in both areas. Post PCI he had black stools and Hbg dropped to 6.0. He was seen by GI. Upper endocscopy showed AVM stomach  and dudoenum as well as pyloric ulcler. He was transfused and started on a PPI. He was last seen in our office August 2012. Apparently he had elevated BP during his [facility] visit (210/110) and was sent to ED. He was seen in  Primary care by Dr. Jonny Ruiz and was started on amlodipine 10 mg once daily.  He continues to smoke 1 ppd.   He is here for follow up. Overall feeling well. No chest pain. No change in his breathing. He has stable claudication. He has been taking all of his medications.    Past Medical History  Diagnosis Date  . CEREBROVASCULAR ACCIDENT, HX OF 03/02/2007  . CHEST PAIN 01/31/2010  . CHRONIC OBSTRUCTIVE PULMONARY DISEASE, ACUTE EXACERBATION 02/18/2009  . COPD 03/02/2007  . CORONARY ARTERY DISEASE 03/02/2007  . DEPRESSION 09/23/2007    anxiety  . DISEASE, PERIPHERAL VASCULAR NEC 03/02/2007  . DIVERTICULOSIS, COLON 09/23/2007  . FATIGUE 01/22/2009  . GERD 03/02/2007  . GLUCOSE INTOLERANCE 09/23/2007  . HEMORRHOIDS 10/09/2007  . HIATAL HERNIA 10/09/2007  . HYPERCHOLESTEROLEMIA 10/09/2007  . HYPERTENSION 09/23/2007  . HYPOKALEMIA 10/09/2007  . MESENTERIC VASCULAR INSUFFICIENCY 07/06/2008  . MITRAL REGURGITATION 11/27/2008  . MI 10/09/2007  . Other specified forms of hearing loss 08/23/2010  . PERIPHERAL VASCULAR DISEASE 09/23/2007  . PUD 10/09/2007  . RENAL ARTERY STENOSIS 03/02/2007  . TOBACCO ABUSE 04/20/2010  . 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 Unspecified Iron Deficiency Anemia 09/23/2007  .  URINARY RETENTION 01/22/2009  . VITAMIN B12 DEFICIENCY 01/03/2010  . WRIST PAIN, RIGHT 06/06/2010  . Impaired glucose tolerance 01/29/2011  . Hearing loss in left ear 01/30/2011  . Blindness of both eyes 01/30/2011  . Hyperlipidemia   . Anemia, iron deficiency   . Atrial flutter   . History of colonoscopy   . HTN (hypertension) 03/21/2011  . Peripheral arterial disease     Past Surgical History  Procedure Date  . Coronary artery bypass graft 1995    4V  . Renal artery stenting 2004   bilateral  . S/p multiple le vascular bypass     includine fem-pop x 2 LLE  . S/p bilateral cea   . S/p ptca to the svg to diag, svg to pda and svg to om   . Appendectomy   . S/p lumbar disc surgury     x2  . S/p rle fem bypass feb 2011   . Cardiac catheterization   . Pr vein bypass graft,aorto-fem-pop   . Carotid endarterectomy     Current Outpatient Prescriptions  Medication Sig Dispense Refill  . amLODipine (NORVASC) 10 MG tablet Take 1 tablet (10 mg total) by mouth daily.  30 tablet  11  . aspirin 81 MG EC tablet Take 81 mg by mouth daily.        Marland Kitchen atorvastatin (LIPITOR) 20 MG tablet Take 20 mg by mouth 2 (two) times daily.        . cilostazol (PLETAL) 100 MG tablet TAKE 1 TABLET TWICE A DAY  60 tablet  2  . clopidogrel (PLAVIX) 75 MG tablet Take 75 mg by mouth daily.        . ferrous sulfate 325 (65 FE) MG tablet Take 325 mg by mouth 3 (three) times daily.        . fexofenadine (ALLEGRA) 180 MG tablet Take 1 tablet (180 mg total) by mouth daily.  90 tablet  3  . isosorbide mononitrate (IMDUR) 60 MG 24 hr tablet TAKE 1 TABLET EVERY DAY  30 tablet  10  . metoprolol (LOPRESSOR) 50 MG tablet Take 50 mg by mouth 2 (two) times daily.        . nitroGLYCERIN (NITROSTAT) 0.4 MG SL tablet Place 0.4 mg under the tongue every 5 (five) minutes as needed.        . pantoprazole (PROTONIX) 40 MG tablet Take 40 mg by mouth 2 (two) times daily with a meal.          No Known Allergies  History   Social History  . Marital Status: Married    Spouse Name: N/A    Number of Children: 2  . Years of Education: N/A   Occupational History  . 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 Smoking status: Current Everyday Smoker -- 1.0 packs/day for 60 years    Types: Cigarettes  . Smokeless tobacco: Not on file   Comment: 1 ppd  . Alcohol Use: No  . Drug Use: No  . Sexually Active: Not on file   Other Topics Concern  . Not on file   Social History  Narrative   Daily caffeine use 1/2 decaf 1/2 caffeine coffee a day    Family History  Problem Relation Age of Onset  . Cancer Mother     colon cancer at 14yo  . Heart disease Father   . Heart disease Sister   . Cancer Brother  prostate cancer  . Stroke Sister     Review of Systems:  As stated in the HPI and otherwise negative.   BP 162/71  Pulse 70  Ht 6' (1.829 m)  Wt 183 lb (83.008 kg)  BMI 24.82 kg/m2  Physical Examination: General: Well developed, well nourished, NAD HEENT: OP clear, mucus membranes moist SKIN: warm, dry. No rashes. Neuro: No focal deficits Musculoskeletal: Muscle strength 5/5 all ext Psychiatric: Mood and affect normal Neck: No JVD, bilateral carotid bruits, no thyromegaly, no lymphadenopathy. Lungs:Clear bilaterally, no wheezes, rhonci, crackles Cardiovascular: Regular rate and rhythm. No murmurs, gallops or rubs. Abdomen:Soft. Bowel sounds present. Non-tender.  Extremities: No lower extremity edema. Pulses non-palpable in the bilateral DP/PT.  Carotid artery dopplers: 06/05/11:  Bilateral CEAs with Dacron patch angioplasty. 0-39% RICA stenosis. 40-59% LICA stenosis.

## 2011-06-07 NOTE — Assessment & Plan Note (Signed)
Smoking cessation encouraged!

## 2011-06-07 NOTE — Patient Instructions (Signed)
Your physician has recommended you make the following change in your medication: Start Lisinopril 10 mg by mouth daily.  Your physician recommends that you return for lab work in: 1 week--June 15, 2011--BMP

## 2011-06-15 ENCOUNTER — Other Ambulatory Visit (INDEPENDENT_AMBULATORY_CARE_PROVIDER_SITE_OTHER): Payer: Medicare Other | Admitting: *Deleted

## 2011-06-15 DIAGNOSIS — I10 Essential (primary) hypertension: Secondary | ICD-10-CM

## 2011-06-15 LAB — BASIC METABOLIC PANEL
CO2: 25 mEq/L (ref 19–32)
Calcium: 9 mg/dL (ref 8.4–10.5)
Creatinine, Ser: 1.1 mg/dL (ref 0.4–1.5)
GFR: 73.27 mL/min (ref >60.00)
Sodium: 140 mEq/L (ref 135–145)

## 2011-06-30 ENCOUNTER — Other Ambulatory Visit: Payer: Self-pay | Admitting: Internal Medicine

## 2011-07-14 ENCOUNTER — Other Ambulatory Visit: Payer: Self-pay | Admitting: Cardiovascular Disease

## 2011-07-14 NOTE — Telephone Encounter (Signed)
..   Requested Prescriptions   Pending Prescriptions Disp Refills  . clopidogrel (PLAVIX) 75 MG tablet [Pharmacy Med Name: CLOPIDOGREL 75 MG TABLET] 30 tablet 8    Sig: TAKE 1 TABLET BY MOUTH ONCE DAILY

## 2011-08-14 ENCOUNTER — Encounter: Payer: Self-pay | Admitting: Vascular Surgery

## 2011-08-16 ENCOUNTER — Other Ambulatory Visit: Payer: Medicare Other

## 2011-08-16 ENCOUNTER — Ambulatory Visit: Payer: Medicare Other | Admitting: Vascular Surgery

## 2011-09-29 ENCOUNTER — Other Ambulatory Visit: Payer: Self-pay | Admitting: Internal Medicine

## 2011-11-07 ENCOUNTER — Encounter: Payer: Self-pay | Admitting: Neurosurgery

## 2011-11-08 ENCOUNTER — Encounter: Payer: Self-pay | Admitting: Neurosurgery

## 2011-11-08 ENCOUNTER — Ambulatory Visit (INDEPENDENT_AMBULATORY_CARE_PROVIDER_SITE_OTHER): Payer: Medicare Other | Admitting: Vascular Surgery

## 2011-11-08 ENCOUNTER — Ambulatory Visit: Payer: Medicare Other | Admitting: Vascular Surgery

## 2011-11-08 ENCOUNTER — Ambulatory Visit (INDEPENDENT_AMBULATORY_CARE_PROVIDER_SITE_OTHER): Payer: Medicare Other | Admitting: Neurosurgery

## 2011-11-08 VITALS — BP 160/70 | HR 68 | Resp 20 | Ht 72.0 in | Wt 185.0 lb

## 2011-11-08 DIAGNOSIS — I739 Peripheral vascular disease, unspecified: Secondary | ICD-10-CM

## 2011-11-08 DIAGNOSIS — Z48812 Encounter for surgical aftercare following surgery on the circulatory system: Secondary | ICD-10-CM

## 2011-11-08 NOTE — Progress Notes (Signed)
[facility] HISTORY AND PHYSICAL   CC: Six-month followup PVD with bypass graft evaluation. Referring Physician: Edilia Bo  History of Present Illness: This is a 77 year old male patient of Dr. Edilia Bo with a history of a left lower extremity bypass with Dr. Madilyn Fireman. He is followed by Dr. Edilia Bo for surveillance of his lower extremity ABIs. The patient reports signs of mild claudication bilaterally with long walks but has no acute complaints of worsening symptoms. Patient has no open wounds in his lower extremities and has no complaints of rest pain at this point.  Past Medical History  Diagnosis Date  . CEREBROVASCULAR ACCIDENT, HX OF 03/02/2007  . CHEST PAIN 01/31/2010  . CHRONIC OBSTRUCTIVE PULMONARY DISEASE, ACUTE EXACERBATION 02/18/2009  . COPD 03/02/2007  . CORONARY ARTERY DISEASE 03/02/2007  . DEPRESSION 09/23/2007    anxiety  . DISEASE, PERIPHERAL VASCULAR NEC 03/02/2007  . DIVERTICULOSIS, COLON 09/23/2007  . FATIGUE 01/22/2009  . GERD 03/02/2007  . GLUCOSE INTOLERANCE 09/23/2007  . HEMORRHOIDS 10/09/2007  . HIATAL HERNIA 10/09/2007  . HYPERCHOLESTEROLEMIA 10/09/2007  . HYPERTENSION 09/23/2007  . HYPOKALEMIA 10/09/2007  . MESENTERIC VASCULAR INSUFFICIENCY 07/06/2008  . MITRAL REGURGITATION 11/27/2008  . MI 10/09/2007  . Other specified forms of hearing loss 08/23/2010  . PERIPHERAL VASCULAR DISEASE 09/23/2007  . PUD 10/09/2007  . RENAL ARTERY STENOSIS 03/02/2007  . TOBACCO ABUSE 04/20/2010  . 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 Unspecified Iron Deficiency Anemia 09/23/2007  . URINARY RETENTION 01/22/2009  . VITAMIN B12 DEFICIENCY 01/03/2010  . WRIST PAIN, RIGHT 06/06/2010  . Impaired glucose tolerance 01/29/2011  . Hearing loss in left ear 01/30/2011  . Blindness of both eyes 01/30/2011  . Hyperlipidemia   . Anemia, iron deficiency   . Atrial flutter   . History of colonoscopy   . HTN (hypertension) 03/21/2011  . Peripheral arterial disease     ROS: [x]   Positive   [ ]  Denies    General: [ ]  Weight loss, [ ]  Fever, [ ]  chills Neurologic: [ ]  Dizziness, [ ]  Blackouts, [ ]  Seizure [ ]  Stroke, [ ]  "Mini stroke", [ ]  Slurred speech, [ ]  Temporary blindness; [ ]  weakness in arms or legs, [ ]  Hoarseness Cardiac: [ ]  Chest pain/pressure, [ ]  Shortness of breath at rest [ ]  Shortness of breath with exertion, [ ]  Atrial fibrillation or irregular heartbeat Vascular: [ ]  Pain in legs with walking, [ ]  Pain in legs at rest, [ ]  Pain in legs at night,  [ ]  Non-healing ulcer, [ ]  Blood clot in vein/DVT,   Pulmonary: [ ]  Home oxygen, [ ]  Productive cough, [ ]  Coughing up blood, [ ]  Asthma,  [ ]  Wheezing Musculoskeletal:  [ ]  Arthritis, [ ]  Low back pain, [ ]  Joint pain Hematologic: [ ]  Easy Bruising, [ ]  Anemia; [ ]  Hepatitis Gastrointestinal: [ ]  Blood in stool, [ ]  Gastroesophageal Reflux/heartburn, [ ]  Trouble swallowing Urinary: [ ]  chronic Kidney disease, [ ]  on HD - [ ]  MWF or [ ]  TTHS, [ ]  Burning with urination, [ ]  Difficulty urinating Skin: [ ]  Rashes, [ ]  Wounds Psychological: [ ]  Anxiety, [ ]  Depression   Social History History  Substance Use Topics  . Smoking status: Current Everyday Smoker -- 1.0 packs/day for 60 years    Types: Cigarettes  . Smokeless tobacco: Never Used   Comment: 1 ppd  . Alcohol Use: No    Family History Family History  Problem Relation Age of Onset  .  Cancer Mother     colon cancer at 43yo  . Heart disease Father   . Heart disease Sister   . Cancer Brother     prostate cancer  . Stroke Sister     No Known Allergies  Current Outpatient Prescriptions  Medication Sig Dispense Refill  . amLODipine (NORVASC) 10 MG tablet Take 1 tablet (10 mg total) by mouth daily.  30 tablet  11  . aspirin 81 MG EC tablet Take 81 mg by mouth daily.        Marland Kitchen atorvastatin (LIPITOR) 20 MG tablet Take 20 mg by mouth 2 (two) times daily.        . cilostazol (PLETAL) 100 MG tablet TAKE 1 TABLET TWICE A DAY  60 tablet  5  .  clopidogrel (PLAVIX) 75 MG tablet TAKE 1 TABLET BY MOUTH ONCE DAILY  30 tablet  8  . ferrous sulfate 325 (65 FE) MG tablet Take 325 mg by mouth 3 (three) times daily.        . fexofenadine (ALLEGRA) 180 MG tablet Take 1 tablet (180 mg total) by mouth daily.  90 tablet  3  . isosorbide mononitrate (IMDUR) 60 MG 24 hr tablet TAKE 1 TABLET EVERY DAY  30 tablet  10  . lisinopril (PRINIVIL,ZESTRIL) 10 MG tablet Take 1 tablet (10 mg total) by mouth daily.  30 tablet  11  . metoprolol (LOPRESSOR) 50 MG tablet Take 1 tablet (50 mg total) by mouth 2 (two) times daily.  60 tablet  6  . nitroGLYCERIN (NITROSTAT) 0.4 MG SL tablet Place 0.4 mg under the tongue every 5 (five) minutes as needed.        . pantoprazole (PROTONIX) 40 MG tablet Take 40 mg by mouth 2 (two) times daily with a meal.          Physical Examination  Filed Vitals:   11/08/11 1140  BP: 160/70  Pulse: 68  Resp: 20    Body mass index is 25.09 kg/(m^2).  General:  WDWN in NAD Gait: Normal HEENT: WNL Eyes: Pupils equal Pulmonary: normal non-labored breathing , without Rales, rhonchi,  wheezing Cardiac: RRR, without  Murmurs, rubs or gallops; No carotid bruits Abdomen: soft, NT, no masses Skin: no rashes, ulcers noted Vascular Exam/Pulses: By Doppler, the patient has dorsalis pedis pulses present bilaterally I did not detect posterior tibial on either side. 1+ radial pulses bilaterally no carotid bruits.  Extremities without ischemic changes, no Gangrene , no cellulitis; no open wounds;  Musculoskeletal: no muscle wasting or atrophy  Neurologic: A&O X 3; Appropriate Affect ; SENSATION: normal; MOTOR FUNCTION:  moving all extremities equally. Speech is fluent/normal  Non-Invasive Vascular Imaging: Today the patient's ABI is 0.86 on the right 0.82 on the left TBI is 0.36 on the right 0.19 on the left previous ABI September 2012 0.6 on the right 0.78 on the left with previous TBI is 0.25 on the right 0.18 on the  left  ASSESSMENT/PLAN: 77 year old patient of Dr. Adele Dan stable if not improved ABIs since previous study September 2012, he will followup here in 6 months with repeat bypass graft evaluation ABIs and will be seen by Dr. Edilia Bo in his clinic.  Lauree Chandler ANP  Clinic M.D.: Edilia Bo

## 2011-11-11 ENCOUNTER — Other Ambulatory Visit: Payer: Self-pay | Admitting: Internal Medicine

## 2011-11-22 NOTE — Procedures (Unsigned)
BYPASS GRAFT EVALUATION  INDICATION:  Peripheral vascular disease.  HISTORY: Diabetes:  No. Cardiac: Hypertension:  Yes. Smoking:  Currently. Previous Surgery:  Left femoral-to-tibial artery bypass graft on 01/09/2011; right femoral-to-popliteal artery bypass graft on 10/01/2009.  SINGLE LEVEL ARTERIAL EXAM                              RIGHT              LEFT Brachial: Anterior tibial: Posterior tibial: Peroneal: Ankle/brachial index:        0.86               0.82 Toe brachial index           0.36               0.19  PREVIOUS ABI:  Date:  05/10/2011  RIGHT:  0.60  LEFT:  0.78 PREVIOUS TBI  Date:  05/10/2011  RIGHT:  0.25  LEFT:  0.18  LOWER EXTREMITY BYPASS GRAFT DUPLEX EXAM:  DUPLEX: Elevated velocities present at the arterial inflow which may suggest stenosis in the 50-75% range.  Dampened monophasic flow present involving the distal graft anastomosis on the left with collateralization present, suggesting an occlusive process possibly distal to the anastomosis site.  IMPRESSION: 1. Patent left femoral-to-anterior tibial bypass graft with distal     anastomotic disease in collaterals present. 2. Native artery stenosis present as noted above. 3. Bilateral ankle brachial indices are in the mild claudication     range. 4. Improvement in ankle brachial indices since previous study on     05/10/2011. 5. Toe brachial indices are essentially unchanged since the previous     study on 05/10/2011.      ___________________________________________ Di Kindle. Edilia Bo, M.D.  SH/MEDQ  D:  11/08/2011  T:  11/08/2011  Job:  [id number]

## 2011-12-27 ENCOUNTER — Other Ambulatory Visit: Payer: Self-pay | Admitting: Cardiovascular Disease

## 2012-01-30 ENCOUNTER — Other Ambulatory Visit: Payer: Self-pay | Admitting: Internal Medicine

## 2012-03-29 ENCOUNTER — Other Ambulatory Visit: Payer: Self-pay | Admitting: Internal Medicine

## 2012-04-11 ENCOUNTER — Other Ambulatory Visit: Payer: Self-pay | Admitting: Cardiovascular Disease

## 2012-05-02 ENCOUNTER — Other Ambulatory Visit: Payer: Self-pay | Admitting: *Deleted

## 2012-05-02 DIAGNOSIS — Z48812 Encounter for surgical aftercare following surgery on the circulatory system: Secondary | ICD-10-CM

## 2012-05-02 DIAGNOSIS — I739 Peripheral vascular disease, unspecified: Secondary | ICD-10-CM

## 2012-05-07 ENCOUNTER — Encounter: Payer: Self-pay | Admitting: Neurosurgery

## 2012-05-08 ENCOUNTER — Ambulatory Visit: Payer: Medicare Other | Admitting: Neurosurgery

## 2012-05-14 ENCOUNTER — Other Ambulatory Visit (INDEPENDENT_AMBULATORY_CARE_PROVIDER_SITE_OTHER): Payer: Medicare Other

## 2012-05-14 ENCOUNTER — Ambulatory Visit (INDEPENDENT_AMBULATORY_CARE_PROVIDER_SITE_OTHER)
Admission: RE | Admit: 2012-05-14 | Discharge: 2012-05-14 | Disposition: A | Discharge status: Home / Self Care | Payer: Medicare Other | Source: Ambulatory Visit | Attending: Internal Medicine | Admitting: Internal Medicine

## 2012-05-14 ENCOUNTER — Encounter: Payer: Self-pay | Admitting: Internal Medicine

## 2012-05-14 ENCOUNTER — Ambulatory Visit (INDEPENDENT_AMBULATORY_CARE_PROVIDER_SITE_OTHER): Payer: Medicare Other | Admitting: Internal Medicine

## 2012-05-14 ENCOUNTER — Other Ambulatory Visit: Payer: Self-pay | Admitting: *Deleted

## 2012-05-14 VITALS — BP 140/70 | HR 76 | Temp 98.3°F | Ht 72.0 in | Wt 171.8 lb

## 2012-05-14 DIAGNOSIS — J449 Chronic obstructive pulmonary disease, unspecified: Secondary | ICD-10-CM

## 2012-05-14 DIAGNOSIS — I7389 Other specified peripheral vascular diseases: Secondary | ICD-10-CM

## 2012-05-14 DIAGNOSIS — N320 Bladder-neck obstruction: Secondary | ICD-10-CM

## 2012-05-14 DIAGNOSIS — I10 Essential (primary) hypertension: Secondary | ICD-10-CM

## 2012-05-14 DIAGNOSIS — R079 Chest pain, unspecified: Secondary | ICD-10-CM

## 2012-05-14 DIAGNOSIS — R7309 Other abnormal glucose: Secondary | ICD-10-CM

## 2012-05-14 DIAGNOSIS — Z23 Encounter for immunization: Secondary | ICD-10-CM

## 2012-05-14 DIAGNOSIS — R7302 Impaired glucose tolerance (oral): Secondary | ICD-10-CM

## 2012-05-14 LAB — BASIC METABOLIC PANEL
BUN: 8 mg/dL (ref 6–23)
CO2: 26 mEq/L (ref 19–32)
Chloride: 102 mEq/L (ref 96–112)
GFR: 95.87 mL/min (ref >60.00)
Glucose, Bld: 97 mg/dL (ref 70–99)
Potassium: 3.9 mEq/L (ref 3.5–5.1)

## 2012-05-14 LAB — LIPID PANEL
Cholesterol: 144 mg/dL (ref 0–200)
HDL: 42.4 mg/dL (ref >39.00)
Triglycerides: 85 mg/dL (ref 0.0–149.0)

## 2012-05-14 LAB — CBC WITH DIFFERENTIAL/PLATELET
Basophils Absolute: 0.1 10*3/uL (ref 0.0–0.1)
Eosinophils Relative: 1.6 % (ref 0.0–5.0)
Lymphocytes Relative: 17.1 % (ref 12.0–46.0)
Monocytes Relative: 7.1 % (ref 3.0–12.0)
Neutrophils Relative %: 73.5 % (ref 43.0–77.0)
Platelets: 240 10*3/uL (ref 150.0–400.0)
RDW: 14.6 % (ref 11.5–14.6)
WBC: 9.9 10*3/uL (ref 4.5–10.5)

## 2012-05-14 LAB — HEPATIC FUNCTION PANEL
ALT: 8 U/L (ref 0–53)
AST: 18 U/L (ref 0–37)
Albumin: 3.6 g/dL (ref 3.5–5.2)
Alkaline Phosphatase: 233 U/L — ABNORMAL HIGH (ref 39–117)
Total Protein: 6.6 g/dL (ref 6.0–8.3)

## 2012-05-14 LAB — TSH: TSH: 1.39 u[IU]/mL (ref 0.35–5.50)

## 2012-05-14 LAB — PSA: PSA: 2.07 ng/mL (ref 0.10–4.00)

## 2012-05-14 MED ORDER — PANTOPRAZOLE SODIUM 40 MG PO TBEC: 40.0000 mg | DELAYED_RELEASE_TABLET | Freq: Two times a day (BID) | ORAL | Status: DC

## 2012-05-14 NOTE — Assessment & Plan Note (Addendum)
With recent CP and today isolated wheeze left chest, denies worsening sob/doe - for cxr

## 2012-05-14 NOTE — Assessment & Plan Note (Signed)
High suspicion for simple reflux flare today off of his PPI , has called EMS twice in the past 2 wks and not f/u with card recently,  For PPI re-start, cxr as above, ECG reviewed as per emr  - no change from oct 2012, will hold on pursuit of stress test for now, but needs f/u with Dr mcAlhany/card

## 2012-05-14 NOTE — Assessment & Plan Note (Signed)
stable overall by hx and exam, most recent data reviewed with pt, and pt to continue medical treatment as before BP Readings from Last 3 Encounters:  05/14/12 140/70  11/08/11 160/70  06/07/11 162/71

## 2012-05-14 NOTE — Assessment & Plan Note (Signed)
Also for PSA f/u as he is due

## 2012-05-14 NOTE — Assessment & Plan Note (Signed)
stable overall by hx and exam, most recent data reviewed with pt, and pt to continue medical treatment as before, for a1c today Lab Results  Component Value Date   HGBA1C 6.1 05/14/2012

## 2012-05-14 NOTE — Assessment & Plan Note (Addendum)
Missed his appt with Dr Edilia Bo last wk - for f/u referral to be done/re-schedule, denies worsening symptoms

## 2012-05-14 NOTE — Patient Instructions (Addendum)
You had the flu shot today You are given the samples of prilosec 20 mg - to take 2 per day until done Please re-start the generic protonix at 40 mg per day Continue all other medications as before You will be contacted regarding the referral for: Dr Clifton , and Dr Edilia Bo Please go to XRAY in the Basement for the x-ray test Please go to LAB in the Basement for the blood and/or urine tests to be done today You will be contacted by phone if any changes need to be made immediately.  Otherwise, you will receive a letter about your results with an explanation. Please remember to sign up for My Chart at your earliest convenience, as this will be important to you in the future with finding out test results. Please return in 6 months, or sooner if needed

## 2012-05-14 NOTE — Progress Notes (Signed)
Subjective:    [name], male    DOB: [birth year], 77 y.o.   MRN: [id number]  HPI here to f/u, c/o recurring low SSCP, dull without radiation or assoc symptoms, improved with belching, out of PPI for over a month.  Denies dysphagia, abd pain, n/v, bowel change or blood.  Wife finally had him come here today as she has called EMS twice in the past 2 wks for the same, and he declined to go to ER.  Has not seen card or vascular recent, in fact missed recent vascular appt last wk.  For flu shot today.  Denies worsening depressive symptoms, suicidal ideation, or panic, though has ongoing anxiety, not increased recently.    Pt denies fever, wt loss, night sweats, loss of appetite, or other constitutional symptoms Past Medical History  Diagnosis Date  . CEREBROVASCULAR ACCIDENT, HX OF 03/02/2007  . CHEST PAIN 01/31/2010  . CHRONIC OBSTRUCTIVE PULMONARY DISEASE, ACUTE EXACERBATION 02/18/2009  . COPD 03/02/2007  . CORONARY ARTERY DISEASE 03/02/2007  . DEPRESSION 09/23/2007    anxiety  . DISEASE, PERIPHERAL VASCULAR NEC 03/02/2007  . DIVERTICULOSIS, COLON 09/23/2007  . FATIGUE 01/22/2009  . GERD 03/02/2007  . GLUCOSE INTOLERANCE 09/23/2007  . HEMORRHOIDS 10/09/2007  . HIATAL HERNIA 10/09/2007  . HYPERCHOLESTEROLEMIA 10/09/2007  . HYPERTENSION 09/23/2007  . HYPOKALEMIA 10/09/2007  . MESENTERIC VASCULAR INSUFFICIENCY 07/06/2008  . MITRAL REGURGITATION 11/27/2008  . MI 10/09/2007  . Other specified forms of hearing loss 08/23/2010  . PERIPHERAL VASCULAR DISEASE 09/23/2007  . PUD 10/09/2007  . RENAL ARTERY STENOSIS 03/02/2007  . TOBACCO ABUSE 04/20/2010  . 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 Unspecified Iron Deficiency Anemia 09/23/2007  . URINARY RETENTION 01/22/2009  . VITAMIN B12 DEFICIENCY 01/03/2010  . WRIST PAIN, RIGHT 06/06/2010  . Impaired glucose tolerance 01/29/2011  . Hearing loss in left ear 01/30/2011  . Blindness of both eyes 01/30/2011  . Hyperlipidemia   . Anemia, iron deficiency     . Atrial flutter   . History of colonoscopy   . HTN (hypertension) 03/21/2011  . Peripheral arterial disease    Past Surgical History  Procedure Date  . Coronary artery bypass graft 1995    4V  . Renal artery stenting 2004    bilateral  . S/p multiple le vascular bypass     includine fem-pop x 2 LLE  . S/p bilateral cea   . S/p ptca to the [facility] to diag, [facility] to pda and sv[facility] to om   . Appendectomy   . S/p lumbar disc surgury     x2  . S/p rle fem bypass feb 2011   . Cardiac catheterization   . Pr vein bypass graft,aorto-fem-pop   . Carotid endarterectomy     reports that he has been smoking Cigarettes.  He has a 60 pack-year smoking history. He has never used smokeless tobacco. He reports that he does not drink alcohol or use illicit drugs. family history includes Cancer in his brother and mother; Heart disease in his father and sister; and Stroke in his sister. No Known Allergies Current Outpatient Prescriptions on File Prior to Visit  Medication Sig Dispense Refill  . amLODipine (NORVASC) 10 MG tablet Take 1 tablet (10 mg total) by mouth daily.  30 tablet  11  . aspirin 81 MG EC tablet Take 81 mg by mouth daily.        Marland Kitchen atorvastatin (LIPITOR) 20 MG tablet Take 20 mg by mouth 2 (two) times daily.        Marland Kitchen  atorvastatin (LIPITOR) 40 MG tablet TAKE 1 TABLET (40 MG TOTAL) BY MOUTH DAILY.  30 tablet  5  . cilostazol (PLETAL) 100 MG tablet TAKE 1 TABLET TWICE A DAY  60 tablet  3  . clopidogrel (PLAVIX) 75 MG tablet TAKE 1 TABLET BY MOUTH ONCE DAILY  30 tablet  8  . ferrous sulfate 325 (65 FE) MG tablet Take 325 mg by mouth 3 (three) times daily.        . isosorbide mononitrate (IMDUR) 60 MG 24 hr tablet TAKE 1 TABLET EVERY DAY  30 tablet  10  . lisinopril (PRINIVIL,ZESTRIL) 10 MG tablet Take 1 tablet (10 mg total) by mouth daily.  30 tablet  11  . metoprolol (LOPRESSOR) 50 MG tablet TAKE 1 TABLET (50 MG TOTAL) BY MOUTH 2 (TWO) TIMES DAILY.  60 tablet  6  . nitroGLYCERIN (NITROSTAT) 0.4  MG SL tablet Place 0.4 mg under the tongue every 5 (five) minutes as needed.        Marland Kitchen DISCONTD: pantoprazole (PROTONIX) 40 MG tablet Take 40 mg by mouth 2 (two) times daily with a meal.        . fexofenadine (ALLEGRA) 180 MG tablet Take 1 tablet (180 mg total) by mouth daily.  90 tablet  3   Review of Systems  Constitutional: Negative for diaphoresis and unexpected weight change.  HENT: Negative for tinnitus.   Eyes: Negative for photophobia and visual disturbance.  Respiratory: Negative for choking and stridor.   Gastrointestinal: Negative for vomiting and blood in stool.  Genitourinary: Negative for hematuria and decreased urine volume.  Musculoskeletal: Negative for gait problem.  Skin: Negative for color change and wound.  Neurological: Negative for tremors and numbness.  Psychiatric/Behavioral: Negative for decreased concentration. The patient is not hyperactive.       Objective:   Physical Exam BP 140/70  Pulse 76  Temp 98.3 F (36.8 C) (Oral)  Ht 6' (1.829 m)  Wt 171 lb 12 oz (77.905 kg)  BMI 23.29 kg/m2  SpO2 93% Physical Exam  VS noted, somewhat more frail appearing than in the past Constitutional: Pt appears well-developed and well-nourished.  HENT: Head: Normocephalic.  Right Ear: External ear normal.  Left Ear: External ear normal.  Eyes: Conjunctivae and EOM are normal. Pupils are equal, round, and reactive to light.  Neck: Normal range of motion. Neck supple.  Cardiovascular: Normal rate and regular rhythm.   Pulmonary/Chest: Effort normal and breath sounds decreased bilat with isolated wheeze left chest.  Abd:  Soft, NT, non-distended, + BS except mild epigastric tender Neurological: Pt is alert. Not confused  Skin: Skin is warm. No erythema.  Psychiatric: Pt behavior is normal. Thought content normal.     Assessment & Plan:

## 2012-05-16 ENCOUNTER — Ambulatory Visit: Payer: Medicare Other | Admitting: Cardiovascular Disease

## 2012-05-19 ENCOUNTER — Other Ambulatory Visit: Payer: Self-pay | Admitting: Internal Medicine

## 2012-05-27 ENCOUNTER — Other Ambulatory Visit: Payer: Self-pay | Admitting: Cardiovascular Disease

## 2012-05-28 ENCOUNTER — Encounter: Payer: Self-pay | Admitting: Neurosurgery

## 2012-05-29 ENCOUNTER — Encounter: Payer: Self-pay | Admitting: Neurosurgery

## 2012-05-29 ENCOUNTER — Ambulatory Visit (INDEPENDENT_AMBULATORY_CARE_PROVIDER_SITE_OTHER): Payer: Medicare Other | Admitting: Vascular Surgery

## 2012-05-29 ENCOUNTER — Ambulatory Visit (INDEPENDENT_AMBULATORY_CARE_PROVIDER_SITE_OTHER): Payer: Medicare Other | Admitting: Neurosurgery

## 2012-05-29 ENCOUNTER — Ambulatory Visit: Payer: Medicare Other | Admitting: Neurosurgery

## 2012-05-29 VITALS — BP 154/66 | HR 59 | Resp 16 | Ht 72.0 in | Wt 170.6 lb

## 2012-05-29 DIAGNOSIS — I739 Peripheral vascular disease, unspecified: Secondary | ICD-10-CM

## 2012-05-29 DIAGNOSIS — Z48812 Encounter for surgical aftercare following surgery on the circulatory system: Secondary | ICD-10-CM

## 2012-05-29 NOTE — Progress Notes (Signed)
[facility] P[facility]/PVD Office Note  CC: PVD surveillance Referring Physician: Edilia Bo  History of Present Illness: 77 year old male patient of Dr. Edilia Bo followed status post left femoral tibial artery bypass graft that was performed by Dr. Madilyn Fireman several years ago. The patient denies claudication, rest pain or open ulceration. The patient states he can walk any distance without difficulty and performs his ADLs without any problem.  Past Medical History  Diagnosis Date  . CEREBROVASCULAR ACCIDENT, HX OF 03/02/2007  . CHEST PAIN 01/31/2010  . CHRONIC OBSTRUCTIVE PULMONARY DISEASE, ACUTE EXACERBATION 02/18/2009  . COPD 03/02/2007  . CORONARY ARTERY DISEASE 03/02/2007  . DEPRESSION 09/23/2007    anxiety  . DISEASE, PERIPHERAL VASCULAR NEC 03/02/2007  . DIVERTICULOSIS, COLON 09/23/2007  . FATIGUE 01/22/2009  . GERD 03/02/2007  . GLUCOSE INTOLERANCE 09/23/2007  . HEMORRHOIDS 10/09/2007  . HIATAL HERNIA 10/09/2007  . HYPERCHOLESTEROLEMIA 10/09/2007  . HYPERTENSION 09/23/2007  . HYPOKALEMIA 10/09/2007  . MESENTERIC VASCULAR INSUFFICIENCY 07/06/2008  . MITRAL REGURGITATION 11/27/2008  . MI 10/09/2007  . Other specified forms of hearing loss 08/23/2010  . PERIPHERAL VASCULAR DISEASE 09/23/2007  . PUD 10/09/2007  . RENAL ARTERY STENOSIS 03/02/2007  . TOBACCO ABUSE 04/20/2010  . 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 Unspecified Iron Deficiency Anemia 09/23/2007  . URINARY RETENTION 01/22/2009  . VITAMIN B12 DEFICIENCY 01/03/2010  . WRIST PAIN, RIGHT 06/06/2010  . Impaired glucose tolerance 01/29/2011  . Hearing loss in left ear 01/30/2011  . Blindness of both eyes 01/30/2011  . Hyperlipidemia   . Anemia, iron deficiency   . Atrial flutter   . History of colonoscopy   . HTN (hypertension) 03/21/2011  . Peripheral arterial disease     ROS: [x]  Positive   [ ]  Denies    General: [ ]  Weight loss, [ ]  Fever, [ ]  chills Neurologic: [ ]  Dizziness, [ ]  Blackouts, [ ]  Seizure [ ]   Stroke, [ ]  "Mini stroke", [ ]  Slurred speech, [ ]  Temporary blindness; [ ]  weakness in arms or legs, [ ]  Hoarseness Cardiac: [ ]  Chest pain/pressure, [ ]  Shortness of breath at rest [ ]  Shortness of breath with exertion, [ ]  Atrial fibrillation or irregular heartbeat Vascular: [ ]  Pain in legs with walking, [ ]  Pain in legs at rest, [ ]  Pain in legs at night,  [ ]  Non-healing ulcer, [ ]  Blood clot in vein/DVT,   Pulmonary: [ ]  Home oxygen, [ ]  Productive cough, [ ]  Coughing up blood, [ ]  Asthma,  [ ]  Wheezing Musculoskeletal:  [ ]  Arthritis, [ ]  Low back pain, [ ]  Joint pain Hematologic: [ ]  Easy Bruising, [ ]  Anemia; [ ]  Hepatitis Gastrointestinal: [ ]  Blood in stool, [ ]  Gastroesophageal Reflux/heartburn, [ ]  Trouble swallowing Urinary: [ ]  chronic Kidney disease, [ ]  on HD - [ ]  MWF or [ ]  TTHS, [ ]  Burning with urination, [ ]  Difficulty urinating Skin: [ ]  Rashes, [ ]  Wounds Psychological: [ ]  Anxiety, [ ]  Depression   Social History History  Substance Use Topics  . Smoking status: Current Every Day Smoker -- 1.0 packs/day for 60 years    Types: Cigarettes  . Smokeless tobacco: Never Used   Comment: 1 ppd  . Alcohol Use: No    Family History Family History  Problem Relation Age of Onset  . Cancer Mother     colon cancer at 34yo  . Heart disease Father   . Cancer Father   . Heart disease  Sister   . Cancer Brother     prostate cancer  . Stroke Sister     No Known Allergies  Current Outpatient Prescriptions  Medication Sig Dispense Refill  . amLODipine (NORVASC) 10 MG tablet TAKE 1 TABLET (10 MG TOTAL) BY MOUTH DAILY.  30 tablet  11  . aspirin 81 MG EC tablet Take 81 mg by mouth daily.        Marland Kitchen atorvastatin (LIPITOR) 20 MG tablet Take 20 mg by mouth 2 (two) times daily.        Marland Kitchen atorvastatin (LIPITOR) 40 MG tablet TAKE 1 TABLET (40 MG TOTAL) BY MOUTH DAILY.  30 tablet  5  . cilostazol (PLETAL) 100 MG tablet TAKE 1 TABLET TWICE A DAY  60 tablet  3  . clopidogrel  (PLAVIX) 75 MG tablet TAKE 1 TABLET BY MOUTH ONCE DAILY  30 tablet  8  . isosorbide mononitrate (IMDUR) 60 MG 24 hr tablet TAKE 1 TABLET EVERY DAY  30 tablet  10  . lisinopril (PRINIVIL,ZESTRIL) 10 MG tablet TAKE 1 TABLET (10 MG TOTAL) BY MOUTH DAILY.  30 tablet  11  . metoprolol (LOPRESSOR) 50 MG tablet TAKE 1 TABLET (50 MG TOTAL) BY MOUTH 2 (TWO) TIMES DAILY.  60 tablet  6  . nitroGLYCERIN (NITROSTAT) 0.4 MG SL tablet Place 0.4 mg under the tongue every 5 (five) minutes as needed.        . pantoprazole (PROTONIX) 40 MG tablet Take 1 tablet (40 mg total) by mouth 2 (two) times daily with a meal.  90 tablet  3  . ferrous sulfate 325 (65 FE) MG tablet Take 325 mg by mouth 3 (three) times daily.        . fexofenadine (ALLEGRA) 180 MG tablet Take 1 tablet (180 mg total) by mouth daily.  90 tablet  3    Physical Examination  Filed Vitals:   05/29/12 1049  BP: 154/66  Pulse: 59  Resp: 16    Body mass index is 23.14 kg/(m^2).  General:  WDWN in NAD Gait: Normal HEENT: WNL Eyes: Pupils equal Pulmonary: normal non-labored breathing , without Rales, rhonchi,  wheezing Cardiac: RRR, without  Murmurs, rubs or gallops; No carotid bruits Abdomen: soft, NT, no masses Skin: no rashes, ulcers noted Vascular Exam/Pulses: Palpable lower extremity pulses bilaterally  Extremities without ischemic changes, no Gangrene , no cellulitis; no open wounds;  Musculoskeletal: no muscle wasting or atrophy  Neurologic: A&O X 3; Appropriate Affect ; SENSATION: normal; MOTOR FUNCTION:  moving all extremities equally. Speech is fluent/normal  Non-Invasive Vascular Imaging: ABIs today are 1.19 on the right, 1.07 and biphasic to monophasic on the left, previous ABIs were 0.86 on the right, 0.82 on the left in March 2013.  ASSESSMENT/PLAN: Asymptomatic patient with no signs of claudication, the patient will followup in one year with repeat ABIs and bypass graft scan. The patient's questions were encouraged and  answered, he is in agreement with this plan.  Lauree Chandler ANP  Clinic M.D.: Edilia Bo

## 2012-05-29 NOTE — Progress Notes (Signed)
Ankle brachial indices performed @ [facility] 05/29/2012

## 2012-05-30 ENCOUNTER — Other Ambulatory Visit: Payer: Self-pay | Admitting: Cardiovascular Disease

## 2012-05-30 NOTE — Addendum Note (Signed)
Addended by: Sharee Pimple on: 05/30/2012 08:15 AM   Modules accepted: Orders

## 2012-06-06 ENCOUNTER — Ambulatory Visit: Payer: Medicare Other | Admitting: Cardiovascular Disease

## 2012-06-30 ENCOUNTER — Other Ambulatory Visit: Payer: Self-pay | Admitting: Cardiovascular Disease

## 2012-07-26 ENCOUNTER — Other Ambulatory Visit: Payer: Self-pay | Admitting: Cardiovascular Disease

## 2012-07-29 ENCOUNTER — Other Ambulatory Visit: Payer: Self-pay | Admitting: Cardiovascular Disease

## 2012-07-29 ENCOUNTER — Other Ambulatory Visit: Payer: Self-pay | Admitting: Internal Medicine

## 2012-10-06 ENCOUNTER — Other Ambulatory Visit: Payer: Self-pay | Admitting: Internal Medicine

## 2012-12-15 ENCOUNTER — Encounter (HOSPITAL_COMMUNITY): Payer: Self-pay

## 2012-12-15 ENCOUNTER — Inpatient Hospital Stay (HOSPITAL_COMMUNITY)
Admission: EM | Admit: 2012-12-15 | Discharge: 2012-12-17 | DRG: 281 | Disposition: A | Discharge status: Home / Self Care | Payer: Medicare Other | Attending: Family Medicine | Admitting: Family Medicine

## 2012-12-15 DIAGNOSIS — H543 Unqualified visual loss, both eyes: Secondary | ICD-10-CM | POA: Diagnosis present

## 2012-12-15 DIAGNOSIS — J4489 Other specified chronic obstructive pulmonary disease: Secondary | ICD-10-CM | POA: Diagnosis present

## 2012-12-15 DIAGNOSIS — J449 Chronic obstructive pulmonary disease, unspecified: Secondary | ICD-10-CM

## 2012-12-15 DIAGNOSIS — Z9861 Coronary angioplasty status: Secondary | ICD-10-CM

## 2012-12-15 DIAGNOSIS — F17200 Nicotine dependence, unspecified, uncomplicated: Secondary | ICD-10-CM | POA: Diagnosis present

## 2012-12-15 DIAGNOSIS — R079 Chest pain, unspecified: Secondary | ICD-10-CM | POA: Diagnosis present

## 2012-12-15 DIAGNOSIS — Z7982 Long term (current) use of aspirin: Secondary | ICD-10-CM

## 2012-12-15 DIAGNOSIS — I219 Acute myocardial infarction, unspecified: Secondary | ICD-10-CM

## 2012-12-15 DIAGNOSIS — Z79899 Other long term (current) drug therapy: Secondary | ICD-10-CM

## 2012-12-15 DIAGNOSIS — I2510 Atherosclerotic heart disease of native coronary artery without angina pectoris: Secondary | ICD-10-CM | POA: Diagnosis present

## 2012-12-15 DIAGNOSIS — I25810 Atherosclerosis of coronary artery bypass graft(s) without angina pectoris: Secondary | ICD-10-CM | POA: Diagnosis present

## 2012-12-15 DIAGNOSIS — I214 Non-ST elevation (NSTEMI) myocardial infarction: Principal | ICD-10-CM | POA: Diagnosis present

## 2012-12-15 DIAGNOSIS — I10 Essential (primary) hypertension: Secondary | ICD-10-CM | POA: Diagnosis present

## 2012-12-15 DIAGNOSIS — J309 Allergic rhinitis, unspecified: Secondary | ICD-10-CM | POA: Diagnosis present

## 2012-12-15 DIAGNOSIS — I70219 Atherosclerosis of native arteries of extremities with intermittent claudication, unspecified extremity: Secondary | ICD-10-CM | POA: Diagnosis present

## 2012-12-15 DIAGNOSIS — I059 Rheumatic mitral valve disease, unspecified: Secondary | ICD-10-CM | POA: Diagnosis present

## 2012-12-15 NOTE — ED Notes (Signed)
Pt c/o chest pressure starting x1 hour ago while watching TV. Pt. Denies N/V, SOB, dizziness. States pain 8/10. Took 2 nitro and 81 mg ASA, pain decreased to 6/10. Cardiac hx. Pt. Alert and oriented x4.

## 2012-12-15 NOTE — ED Notes (Signed)
Per EMS, pt c/o chest pressure starting x1 hour ago while watching TV. Denies N/V, dizziness, SOB.

## 2012-12-16 ENCOUNTER — Emergency Department (HOSPITAL_COMMUNITY): Payer: Medicare Other

## 2012-12-16 ENCOUNTER — Encounter (HOSPITAL_COMMUNITY): Payer: Self-pay | Admitting: Radiology

## 2012-12-16 ENCOUNTER — Encounter (HOSPITAL_COMMUNITY)
Admission: EM | Disposition: A | Discharge status: Home / Self Care | Payer: Self-pay | Source: Home / Self Care | Attending: Family Medicine

## 2012-12-16 DIAGNOSIS — I2510 Atherosclerotic heart disease of native coronary artery without angina pectoris: Secondary | ICD-10-CM

## 2012-12-16 DIAGNOSIS — J449 Chronic obstructive pulmonary disease, unspecified: Secondary | ICD-10-CM

## 2012-12-16 DIAGNOSIS — R079 Chest pain, unspecified: Secondary | ICD-10-CM

## 2012-12-16 DIAGNOSIS — I359 Nonrheumatic aortic valve disorder, unspecified: Secondary | ICD-10-CM

## 2012-12-16 HISTORY — PX: LEFT HEART CATHETERIZATION WITH CORONARY/GRAFT ANGIOGRAM: SHX5450

## 2012-12-16 LAB — BASIC METABOLIC PANEL
CO2: 26 mEq/L (ref 19–32)
Chloride: 105 mEq/L (ref 96–112)
Creatinine, Ser: 0.91 mg/dL (ref 0.50–1.35)
Glucose, Bld: 105 mg/dL — ABNORMAL HIGH (ref 70–99)

## 2012-12-16 LAB — POCT I-STAT, CHEM 8
BUN: 7 mg/dL (ref 6–23)
Calcium, Ion: 1.17 mmol/L (ref 1.13–1.30)
Creatinine, Ser: 0.9 mg/dL (ref 0.50–1.35)
Hemoglobin: 11.9 g/dL — ABNORMAL LOW (ref 13.0–17.0)
TCO2: 23 mmol/L (ref 0–100)

## 2012-12-16 LAB — CBC
HCT: 34.3 % — ABNORMAL LOW (ref 39.0–52.0)
Hemoglobin: 11.9 g/dL — ABNORMAL LOW (ref 13.0–17.0)
MCH: 30 pg (ref 26.0–34.0)
MCV: 86.4 fL (ref 78.0–100.0)
Platelets: 196 10*3/uL (ref 150–400)
RBC: 3.97 MIL/uL — ABNORMAL LOW (ref 4.22–5.81)
WBC: 8.6 10*3/uL (ref 4.0–10.5)

## 2012-12-16 LAB — TROPONIN I
Troponin I: 0.7 ng/mL (ref <0.30)
Troponin I: 0.74 ng/mL (ref <0.30)

## 2012-12-16 LAB — POCT ACTIVATED CLOTTING TIME: Activated Clotting Time: 138 seconds

## 2012-12-16 LAB — PROTIME-INR: INR: 0.89 (ref 0.00–1.49)

## 2012-12-16 SURGERY — LEFT HEART CATHETERIZATION WITH CORONARY/GRAFT ANGIOGRAM
Anesthesia: LOCAL

## 2012-12-16 MED ORDER — DEXTROSE 5 % IV SOLN: 2.0000 g | Freq: Once | INTRAVENOUS | Status: DC

## 2012-12-16 MED ORDER — ASPIRIN 81 MG PO CHEW
324.0000 mg | CHEWABLE_TABLET | Freq: Once | ORAL | Status: AC
  Administered 2012-12-16: 243 mg via ORAL
  Filled 2012-12-16: qty 3

## 2012-12-16 MED ORDER — HEPARIN SODIUM (PORCINE) 5000 UNIT/ML IJ SOLN
5000.0000 [IU] | Freq: Three times a day (TID) | INTRAMUSCULAR | Status: DC
  Administered 2012-12-16: 5000 [IU] via SUBCUTANEOUS
  Filled 2012-12-16 (×4): qty 1

## 2012-12-16 MED ORDER — ASPIRIN 81 MG PO TBEC: 81.0000 mg | DELAYED_RELEASE_TABLET | Freq: Every day | ORAL | Status: DC

## 2012-12-16 MED ORDER — LISINOPRIL 10 MG PO TABS
10.0000 mg | ORAL_TABLET | Freq: Every day | ORAL | Status: DC
  Administered 2012-12-16 – 2012-12-17 (×2): 10 mg via ORAL
  Filled 2012-12-16 (×2): qty 1

## 2012-12-16 MED ORDER — ATORVASTATIN CALCIUM 40 MG PO TABS
40.0000 mg | ORAL_TABLET | Freq: Every day | ORAL | Status: DC
  Administered 2012-12-16: 40 mg via ORAL
  Filled 2012-12-16 (×2): qty 1

## 2012-12-16 MED ORDER — ASPIRIN EC 81 MG PO TBEC
81.0000 mg | DELAYED_RELEASE_TABLET | Freq: Every day | ORAL | Status: DC
  Administered 2012-12-17: 81 mg via ORAL
  Filled 2012-12-16 (×2): qty 1

## 2012-12-16 MED ORDER — PANTOPRAZOLE SODIUM 40 MG PO TBEC
40.0000 mg | DELAYED_RELEASE_TABLET | Freq: Two times a day (BID) | ORAL | Status: DC
  Administered 2012-12-16 – 2012-12-17 (×3): 40 mg via ORAL
  Filled 2012-12-16 (×4): qty 1

## 2012-12-16 MED ORDER — DEXTROSE 5 % IV SOLN
500.0000 mg | Freq: Once | INTRAVENOUS | Status: DC
  Filled 2012-12-16: qty 500

## 2012-12-16 MED ORDER — ACETAMINOPHEN 325 MG PO TABS: 650.0000 mg | ORAL_TABLET | ORAL | Status: DC | PRN

## 2012-12-16 MED ORDER — HEPARIN BOLUS VIA INFUSION
4000.0000 [IU] | Freq: Once | INTRAVENOUS | Status: AC
  Administered 2012-12-16: 4000 [IU] via INTRAVENOUS
  Filled 2012-12-16: qty 4000

## 2012-12-16 MED ORDER — ONDANSETRON HCL 4 MG/2ML IJ SOLN: 4.0000 mg | Freq: Four times a day (QID) | INTRAMUSCULAR | Status: DC | PRN

## 2012-12-16 MED ORDER — CILOSTAZOL 100 MG PO TABS
100.0000 mg | ORAL_TABLET | Freq: Two times a day (BID) | ORAL | Status: DC
  Administered 2012-12-16 – 2012-12-17 (×3): 100 mg via ORAL
  Filled 2012-12-16 (×4): qty 1

## 2012-12-16 MED ORDER — SODIUM CHLORIDE 0.9 % IJ SOLN: 3.0000 mL | Freq: Two times a day (BID) | INTRAMUSCULAR | Status: DC

## 2012-12-16 MED ORDER — HEPARIN (PORCINE) IN NACL 100-0.45 UNIT/ML-% IJ SOLN
1300.0000 [IU]/h | INTRAMUSCULAR | Status: DC
  Administered 2012-12-16: 1000 [IU]/h via INTRAVENOUS
  Filled 2012-12-16 (×2): qty 250

## 2012-12-16 MED ORDER — NITROGLYCERIN 0.4 MG SL SUBL: 0.4000 mg | SUBLINGUAL_TABLET | SUBLINGUAL | Status: DC | PRN

## 2012-12-16 MED ORDER — HEPARIN (PORCINE) IN NACL 100-0.45 UNIT/ML-% IJ SOLN
1000.0000 [IU]/kg/h | INTRAMUSCULAR | Status: DC
  Filled 2012-12-16 (×87): qty 250

## 2012-12-16 MED ORDER — HEPARIN (PORCINE) IN NACL 2-0.9 UNIT/ML-% IJ SOLN
INTRAMUSCULAR | Status: AC
  Filled 2012-12-16: qty 1000

## 2012-12-16 MED ORDER — LORATADINE 10 MG PO TABS
10.0000 mg | ORAL_TABLET | Freq: Every day | ORAL | Status: DC
  Administered 2012-12-16: 10 mg via ORAL
  Filled 2012-12-16 (×2): qty 1

## 2012-12-16 MED ORDER — AMLODIPINE BESYLATE 10 MG PO TABS
10.0000 mg | ORAL_TABLET | Freq: Every day | ORAL | Status: DC
  Administered 2012-12-16: 10 mg via ORAL
  Filled 2012-12-16 (×2): qty 1

## 2012-12-16 MED ORDER — DIAZEPAM 5 MG PO TABS
5.0000 mg | ORAL_TABLET | ORAL | Status: AC
  Administered 2012-12-16: 5 mg via ORAL
  Filled 2012-12-16: qty 1

## 2012-12-16 MED ORDER — CLOPIDOGREL BISULFATE 75 MG PO TABS
75.0000 mg | ORAL_TABLET | Freq: Every day | ORAL | Status: DC
Start: 2012-12-16 — End: 2012-12-17
  Administered 2012-12-16 – 2012-12-17 (×2): 75 mg via ORAL
  Filled 2012-12-16 (×2): qty 1

## 2012-12-16 MED ORDER — LIDOCAINE HCL (PF) 1 % IJ SOLN
INTRAMUSCULAR | Status: AC
  Filled 2012-12-16: qty 30

## 2012-12-16 MED ORDER — SODIUM CHLORIDE 0.9 % IV SOLN
1.0000 mL/kg/h | INTRAVENOUS | Status: DC
  Administered 2012-12-16: 1 mL/kg/h via INTRAVENOUS

## 2012-12-16 MED ORDER — SODIUM CHLORIDE 0.9 % IV BOLUS (SEPSIS)
500.0000 mL | Freq: Once | INTRAVENOUS | Status: AC
  Administered 2012-12-16: 500 mL via INTRAVENOUS

## 2012-12-16 MED ORDER — IOHEXOL 350 MG/ML SOLN
80.0000 mL | Freq: Once | INTRAVENOUS | Status: AC | PRN
  Administered 2012-12-16: 80 mL via INTRAVENOUS

## 2012-12-16 MED ORDER — SODIUM CHLORIDE 0.9 % IV SOLN: INTRAVENOUS | Status: DC

## 2012-12-16 MED ORDER — NITROGLYCERIN 0.4 MG SL SUBL
0.4000 mg | SUBLINGUAL_TABLET | SUBLINGUAL | Status: DC | PRN
  Administered 2012-12-16: 0.4 mg via SUBLINGUAL
  Filled 2012-12-16 (×2): qty 25

## 2012-12-16 MED ORDER — FERROUS SULFATE 325 (65 FE) MG PO TABS
325.0000 mg | ORAL_TABLET | Freq: Three times a day (TID) | ORAL | Status: DC
  Administered 2012-12-16 – 2012-12-17 (×2): 325 mg via ORAL
  Filled 2012-12-16 (×7): qty 1

## 2012-12-16 MED ORDER — SODIUM CHLORIDE 0.9 % IJ SOLN: 3.0000 mL | INTRAMUSCULAR | Status: DC | PRN

## 2012-12-16 MED ORDER — ASPIRIN 81 MG PO CHEW: 324.0000 mg | CHEWABLE_TABLET | ORAL | Status: DC

## 2012-12-16 MED ORDER — METOPROLOL TARTRATE 50 MG PO TABS
50.0000 mg | ORAL_TABLET | Freq: Two times a day (BID) | ORAL | Status: DC
  Administered 2012-12-16 – 2012-12-17 (×3): 50 mg via ORAL
  Filled 2012-12-16 (×4): qty 1

## 2012-12-16 MED ORDER — ISOSORBIDE MONONITRATE ER 60 MG PO TB24
60.0000 mg | ORAL_TABLET | Freq: Every day | ORAL | Status: DC
  Administered 2012-12-16: 60 mg via ORAL
  Filled 2012-12-16 (×2): qty 1

## 2012-12-16 MED ORDER — SODIUM CHLORIDE 0.9 % IV SOLN: 250.0000 mL | INTRAVENOUS | Status: DC | PRN

## 2012-12-16 MED ORDER — FENTANYL CITRATE 0.05 MG/ML IJ SOLN
INTRAMUSCULAR | Status: AC
  Filled 2012-12-16: qty 2

## 2012-12-16 NOTE — Progress Notes (Signed)
ANTICOAGULATION CONSULT NOTE - Initial Consult  Pharmacy Consult for heparin Indication: chest pain/ACS  No Known Allergies  Patient Measurements: Height: 6' (182.9 cm) Weight: 170 lb 9.6 oz (77.384 kg) IBW/kg (Calculated) : 77.6 Heparin Dosing Weight: 77 kg  Vital Signs: Temp: 97.7 F (36.5 C) (05/05 0600) Temp src: Oral (05/05 0600) BP: 160/61 mmHg (05/05 0600) Pulse Rate: 82 (05/05 0600)  Labs:  Recent Labs  12/16/12 0137 12/16/12 0433 12/16/12 0434  HGB 11.9*  --  11.9*  HCT 35.0*  --  34.3*  PLT  --   --  196  CREATININE 0.90  --  0.91  TROPONINI  --  0.42*  --     Estimated Creatinine Clearance: 75.6 ml/min (by C-G formula based on Cr of 0.91).   Medical History: Past Medical History  Diagnosis Date  . CEREBROVASCULAR ACCIDENT, HX OF 03/02/2007  . CHEST PAIN 01/31/2010  . CHRONIC OBSTRUCTIVE PULMONARY DISEASE, ACUTE EXACERBATION 02/18/2009  . COPD 03/02/2007  . CORONARY ARTERY DISEASE 03/02/2007  . DEPRESSION 09/23/2007    anxiety  . DISEASE, PERIPHERAL VASCULAR NEC 03/02/2007  . DIVERTICULOSIS, COLON 09/23/2007  . FATIGUE 01/22/2009  . GERD 03/02/2007  . GLUCOSE INTOLERANCE 09/23/2007  . HEMORRHOIDS 10/09/2007  . HIATAL HERNIA 10/09/2007  . HYPERCHOLESTEROLEMIA 10/09/2007  . HYPERTENSION 09/23/2007  . HYPOKALEMIA 10/09/2007  . MESENTERIC VASCULAR INSUFFICIENCY 07/06/2008  . MITRAL REGURGITATION 11/27/2008  . MI 10/09/2007  . Other specified forms of hearing loss 08/23/2010  . PERIPHERAL VASCULAR DISEASE 09/23/2007  . PUD 10/09/2007  . RENAL ARTERY STENOSIS 03/02/2007  . TOBACCO ABUSE 04/20/2010  . 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 Unspecified Iron Deficiency Anemia 09/23/2007  . URINARY RETENTION 01/22/2009  . VITAMIN B12 DEFICIENCY 01/03/2010  . WRIST PAIN, RIGHT 06/06/2010  . Impaired glucose tolerance 01/29/2011  . Hearing loss in left ear 01/30/2011  . Blindness of both eyes 01/30/2011  . Hyperlipidemia   . Anemia, iron deficiency   . Atrial flutter    . History of colonoscopy   . HTN (hypertension) 03/21/2011  . Peripheral arterial disease     Medications:  Prescriptions prior to admission  Medication Sig Dispense Refill  . amLODipine (NORVASC) 10 MG tablet Take 10 mg by mouth daily.      Marland Kitchen aspirin 81 MG EC tablet Take 81 mg by mouth daily.        Marland Kitchen atorvastatin (LIPITOR) 40 MG tablet Take 40 mg by mouth daily.      . cilostazol (PLETAL) 100 MG tablet Take 100 mg by mouth 2 (two) times daily.      . clopidogrel (PLAVIX) 75 MG tablet Take 75 mg by mouth daily.      . ferrous sulfate 325 (65 FE) MG tablet Take 325 mg by mouth 3 (three) times daily.        . fexofenadine (ALLEGRA) 180 MG tablet Take 180 mg by mouth daily.      . isosorbide mononitrate (IMDUR) 60 MG 24 hr tablet Take 60 mg by mouth daily.      Marland Kitchen lisinopril (PRINIVIL,ZESTRIL) 10 MG tablet Take 10 mg by mouth daily.      . metoprolol (LOPRESSOR) 50 MG tablet Take 50 mg by mouth 2 (two) times daily.      . nitroGLYCERIN (NITROSTAT) 0.4 MG SL tablet Place 0.4 mg under the tongue every 5 (five) minutes as needed.        . pantoprazole (PROTONIX) 40 MG tablet Take 1 tablet (40 mg total)  by mouth 2 (two) times daily with a meal.  90 tablet  3    Assessment: [name] is a 78 yo man to start heparin per pharmacy for ACS/STEMI.   He has CP and an elevated cardiac enzyme.  PMH sign for CAD,  CABG 95. Renal artery stent 04, B CEA 10, DES x2 and PTCA/cutting balloon 10, AOFPBPG. He did receive heparin 5000 units SQ for VTE px at ~ 8am.  His H/H is 11.9/24.3.  PLTC 196.  Troponin 0.42 and abnormal ECG.  Plan to go to the cath lab today.  Goal of Therapy:  Heparin level 0.3-0.7 units/ml Monitor platelets by anticoagulation protocol: Yes   Plan:  1. Heparin 4000 units IV bolus 2. Heparin drip at 1000 units/hr 3. Heparin level 8 hrs after bolus or f/u after cath lab 4. Daily HL/CBC while on heparin [name], Pharm.D. [phone] 12/16/2012 9:57 AM

## 2012-12-16 NOTE — Interval H&P Note (Signed)
History and Physical Interval Note:  12/16/2012 1:33 PM  [name]  has presented today for surgery, with the diagnosis of cp  The various methods of treatment have been discussed with the patient and family. After consideration of risks, benefits and other options for treatment, the patient has consented to  Procedure(s): LEFT HEART CATHETERIZATION WITH CORONARY/GRAFT ANGIOGRAM (N/A) as a surgical intervention .  The patient's history has been reviewed, patient examined, no change in status, stable for surgery.  I have reviewed the patient's chart and labs.  Questions were answered to the patient's satisfaction.     Elyn Aquas.

## 2012-12-16 NOTE — Consult Note (Signed)
 CARDIOLOGY CONSULT NOTE   [name] MRN: [id number] DOB/AGE: 12/13/1934 78 y.o.  Admit date: 12/15/2012  Primary Physician   James John, MD Primary Cardiologist   CM Reason for Consultation   Chest pain, elevated cardiac enzymes  HPI:[name] is a 78 y.o. male with a history of CAD.  The last several weeks, he has been having increasing episodes of epigastric/subxiphoid chest pain. It wakes him at night. He has it more frequently after eating fatty foods late at night. It has been happening almost every night recently. When he gets it, he will set up for a while and belch. The belching relieves the pain. Last p.m., he had a baloney sandwich and popcorn. Before going to bed, he had onset of subxiphoid and mid epigastric pain. He reached an 8/10. It did not radiate. There were no associated symptoms. TUMS has never helped in the past. He took his evening protonic slips upper as prescribed. When his symptoms did not resolve, he took 2 nitroglycerin with a decrease in his pain but not relief. When his symptoms did not resolve, EMS was called. His pain finally resolved in the emergency room when he was finally able to belch. It has not returned. Of note, although his oxygen saturation dropped as low as 85% in the emergency room the patient denied shortness of breath.   Past Medical History  Diagnosis Date  . CEREBROVASCULAR ACCIDENT, HX OF 03/02/2007  . CHEST PAIN 01/31/2010  . CHRONIC OBSTRUCTIVE PULMONARY DISEASE, ACUTE EXACERBATION 02/18/2009  . COPD 03/02/2007  . CORONARY ARTERY DISEASE 03/02/2007  . DEPRESSION 09/23/2007    anxiety  . DISEASE, PERIPHERAL VASCULAR NEC 03/02/2007  . DIVERTICULOSIS, COLON 09/23/2007  . FATIGUE 01/22/2009  . GERD 03/02/2007  . GLUCOSE INTOLERANCE 09/23/2007  . HEMORRHOIDS 10/09/2007  . HIATAL HERNIA 10/09/2007  . HYPERCHOLESTEROLEMIA 10/09/2007  . HYPERTENSION 09/23/2007  . HYPOKALEMIA 10/09/2007  . MESENTERIC VASCULAR INSUFFICIENCY 07/06/2008  . MITRAL  REGURGITATION 11/27/2008  . MI 10/09/2007  . Other specified forms of hearing loss 08/23/2010  . PERIPHERAL VASCULAR DISEASE 09/23/2007  . PUD 10/09/2007  . RENAL ARTERY STENOSIS 03/02/2007  . TOBACCO ABUSE 04/20/2010  . 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 Unspecified Iron Deficiency Anemia 09/23/2007  . URINARY RETENTION 01/22/2009  . VITAMIN B12 DEFICIENCY 01/03/2010  . WRIST PAIN, RIGHT 06/06/2010  . Impaired glucose tolerance 01/29/2011  . Hearing loss in left ear 01/30/2011  . Blindness of both eyes 01/30/2011  . Hyperlipidemia   . Anemia, iron deficiency   . Atrial flutter   . History of colonoscopy   . HTN (hypertension) 03/21/2011  . Peripheral arterial disease     Past Surgical History  Procedure Laterality Date  . Coronary artery bypass graft  1995    LIMA-LAD, SVG-OM, SVG-D, SVG-PDA  . Renal artery stenting  2004    bilateral  . S/p multiple le vascular bypass      includine fem-pop x 2 LLE  . S/p bilateral cea    . Coronary angioplasty with stent placement  2010    DES x 2 SVG-OM, cutting balloon PTCA SVG-PDA for ISR  . Appendectomy    . S/p lumbar disc surgury      x2  . S/p rle fem bypass feb 2011    . Cardiac catheterization    . Pr vein bypass graft,aorto-fem-pop    . Carotid endarterectomy    . Coronary angioplasty with stent placement  2011      DES to SVG-DIAG, SVG-PDA   No Known Allergies  I have reviewed the patient's current medications . amLODipine  10 mg Oral Daily  . aspirin EC  81 mg Oral Daily  . atorvastatin  40 mg Oral q1800  . cilostazol  100 mg Oral BID  . clopidogrel  75 mg Oral Daily  . ferrous sulfate  325 mg Oral TID WC  . heparin  5,000 Units Subcutaneous Q8H  . isosorbide mononitrate  60 mg Oral Daily  . lisinopril  10 mg Oral Daily  . loratadine  10 mg Oral Daily  . metoprolol  50 mg Oral BID  . pantoprazole  40 mg Oral BID WC  . sodium chloride  3 mL Intravenous Q12H     nitroGLYCERIN, nitroGLYCERIN  Medication Sig    amLODipine (NORVASC) 10 MG tablet Take 10 mg by mouth daily.  aspirin 81 MG EC tablet Take 81 mg by mouth daily.    atorvastatin (LIPITOR) 40 MG tablet Take 40 mg by mouth daily.  cilostazol (PLETAL) 100 MG tablet Take 100 mg by mouth 2 (two) times daily.  clopidogrel (PLAVIX) 75 MG tablet Take 75 mg by mouth daily.  ferrous sulfate 325 (65 FE) MG tablet Take 325 mg by mouth 3 (three) times daily.    fexofenadine (ALLEGRA) 180 MG tablet Take 180 mg by mouth daily.  isosorbide mononitrate (IMDUR) 60 MG 24 hr tablet Take 60 mg by mouth daily.  lisinopril (PRINIVIL,ZESTRIL) 10 MG tablet Take 10 mg by mouth daily.  metoprolol (LOPRESSOR) 50 MG tablet Take 50 mg by mouth 2 (two) times daily.  nitroGLYCERIN (NITROSTAT) 0.4 MG SL tablet Place 0.4 mg under the tongue every 5 (five) minutes as needed.    pantoprazole (PROTONIX) 40 MG tablet Take 1 tablet (40 mg total) by mouth 2 (two) times daily with a meal.     History   Social History  . Marital Status: Married    Spouse Name: N/A    Number of Children: 2  . Years of Education: N/A   Occupational History  . 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 Smoking status: Current Every Day Smoker -- 1.00 packs/day for 60 years    Types: Cigarettes  . Smokeless tobacco: Never Used     Comment: 1 ppd  . Alcohol Use: No  . Drug Use: No  . Sexually Active: Not on file   Other Topics Concern  . Not on file   Social History Narrative   Daily caffeine use 1/2 decaf 1/2 caffeine coffee a day    Family Status  Relation Status Death Age  . Mother Deceased   . Father Deceased   . Sister Deceased     stroke   Family History  Problem Relation Age of Onset  . Cancer Mother     colon cancer at 42yo  . Heart disease Father   . Cancer Father   . Heart disease Sister   . Cancer Brother     prostate cancer  . Stroke Sister      ROS:  Full 14 point review of systems complete and found to be  negative unless listed above.  Physical Exam: Blood pressure 160/61, pulse 82, temperature 97.7 F (36.5 C), temperature source Oral, resp. rate 18, height 6' (1.829 m), weight 170 lb 9.6 oz (77.384 kg), SpO2 96.00%.  General: Well developed, well nourished, male in no acute distress Head: Eyes PERRLA, No   xanthomas.   Normocephalic and atraumatic, oropharynx without edema or exudate. Dentition: poor  Lungs: rales and some rhonchi in bases Heart: HRRR S1 S2, no rub/gallop, 2/6 murmur. pulses are 2+ upper extrem. Decreased in both lower extremities but palpable left DP. Bilateral femoral bruits noted   Neck: No carotid bruits. No lymphadenopathy.  JVD minimal. Abdomen: Bowel sounds present, abdomen soft and non-tender without masses or hernias noted. Msk:  No spine or cva tenderness. No weakness, no joint deformities or effusions. Extremities: No clubbing or cyanosis. No edema.  Neuro: Alert and oriented X 3. No focal deficits noted. Psych:  Good affect, responds appropriately Skin: No rashes or lesions noted.  Labs:   Lab Results  Component Value Date   WBC 8.6 12/16/2012   HGB 11.9* 12/16/2012   HCT 34.3* 12/16/2012   MCV 86.4 12/16/2012   PLT 196 12/16/2012   No results found for this basename: INR,  in the last 72 hours   Recent Labs Lab 12/16/12 0434  NA 140  K 3.8  CL 105  CO2 26  BUN 8  CREATININE 0.91  CALCIUM 9.2  GLUCOSE 105*    Recent Labs  12/16/12 0433  TROPONINI 0.42*    Recent Labs  12/16/12 0135  TROPIPOC 0.02   Echo: 12/29/2009 Study Conclusions - Left ventricle: The cavity size was normal. Wall thickness was increased in a pattern of mild LVH. There was mild focal basal hypertrophy of the septum. Systolic function was normal. The estimated ejection fraction was in the range of 55% to 60%. Wall motion was normal; there were no regional wall motion abnormalities. Doppler parameters are consistent with abnormal left ventricular relaxation (grade 1 diastolic  dysfunction). - Mitral valve: Calcified annulus. Mild regurgitation. - Left atrium: The atrium was mildly dilated. - Atrial septum: There was an atrial septal aneurysm. - Pulmonic valve: The findings are consistent with mild stenosis. Impressions: - Mild SAM but no significant LVOT gradient at rest.  ECG:  16-Dec-2012 06:52:14 [facility] ROUTINE RECORD Sinus rhythm with 1st degree A-V block ST & T wave abnormality, consider lateral ischemia Prolonged QT Abnormal ECG 25mm/s 10mm/mV 100Hz 8.0.1 12SL 241 HD CID: 1 Referred by: Unconfirmed Vent. rate 90 BPM PR interval 218 ms QRS duration 106 ms QT/QTc 400/489 ms P-R-T axes 66 23 86  Radiology:  Ct Angio Chest Pe W/cm &/or Wo Cm 12/16/2012  *RADIOLOGY REPORT*  Clinical Data: 78-year-old male with chest pain and pressure.  CT ANGIOGRAPHY CHEST  Technique:  Multidetector CT imaging of the chest using the standard protocol during bolus administration of intravenous contrast. Multiplanar reconstructed images including MIPs were obtained and reviewed to evaluate the vascular anatomy.  Contrast: 80mL OMNIPAQUE IOHEXOL 350 MG/ML SOLN  Comparison: Portable chest radiograph from the same day and earlier. CT abdomen 07/23/2008.  Findings: Good contrast bolus timing in the pulmonary arterial tree.  No focal filling defect identified in the pulmonary arterial tree to suggest the presence of acute pulmonary embolism.  Severe widespread atherosclerosis, mostly calcified plaque. Diffuse involvement of the aorta and great vessels.  Coronary artery involvement.  No mediastinal lymphadenopathy.  Negative visualized thyroid.  Previous median sternotomy. No acute osseous abnormality identified.  Major airways are patent.  Widespread peribronchial thickening. Mild upper lobe bronchiectasis.  Mild to moderate upper lobe predominant emphysema.  In the right hemithorax there is pleural thickening and/or trace pleural effusion.  Some small calcified  pleural plaques are noted.  None of the areas of pleural thickening   are mass-like.  There is associated subpleural scarring and reticular opacity.  There are more confluent subpleural and streaky areas of pulmonary opacity.  None of these parenchymal foci are mass like. The findings at the right lung base have progressed since 2009.  Superimposed dependent atelectasis in the right lung.  In the left lung there is also dependent atelectasis.  There is mild scarring in the lingula.  Negative visualized right upper quadrant abdominal viscera. Chronic left adrenal mass measures approximately 20 mm and appears not significantly changed since 2009.  This has densitometry of less than 10 HU today (8 HU).  IMPRESSION: 1. No evidence of acute pulmonary embolus. 2.  Chronic lung disease with widespread right lung pleural thickening, progressed at the lung base since the 2009 CT abdomen. Associated scattered subpleural and confluent scarring.   No areas suspicious for acute lung infection.  No focal suspicious or mass-like area identified in the right lung, but imaging surveillance (such as periodic chest radiographs) of this patient's lungs would be prudent. 3. Advanced widespread atherosclerosis. 4.  Chronic benign appearing left adrenal mass.  Favor adenoma.   Original Report Authenticated By: H. Hall III, M.D.    Dg Chest Port 1 View 12/16/2012  *RADIOLOGY REPORT*  Clinical Data: 78-year-old male with chest and epigastric pain.  PORTABLE CHEST - 1 VIEW  Comparison: 05/14/2012 and earlier.  Findings: AP portable semi upright view 0047 hours.  Stable lung volumes.  Stable cardiac size and mediastinal contours.  Stable sequelae of CABG.  No pneumothorax.  Chronic increased interstitial markings asymmetrically greater on the right again noted.  No pleural effusion.  Mildly increased confluent opacity on the right. Otherwise no acute pulmonary opacity.  IMPRESSION: Chronic lung disease. A small area of mild increased  confluence of opacity on the right suspicious for a right lung infectious exacerbation.   Original Report Authenticated By: H. Hall III, M.D.     ASSESSMENT AND PLAN:   The patient was seen today by Dr. Linzi Ohlinger, the patient evaluated and the data reviewed.  Principal Problem:   Chest pain  - although there are atypical features to the chest pain, he got partial with leaflets nitroglycerin, has a long history of coronary artery disease and has an elevated troponin. Therefore, cardiac catheterization is indicated. The risks and benefits of a cardiac catheterization including, but not limited to, death, stroke, MI, kidney damage and bleeding were discussed with the patient who indicates understanding and agrees to proceed.   Otherwise, per primary M.D. Active Problems:   COPD   Signed: Rhonda Barrett, PA-C 12/16/2012 9:36 AM Beeper [phone]  Co-Sign MD  Patient examined chart reviewed.  SEMI in patient with known CAD and previous stents to SVG;s Has not had cath in two years. Feel best approach is cath not myovue.  Given elevated troponin. No acute ECG changes. Will proceed today. Start heparin Continue plavix and check P2Y  Severo Beber   

## 2012-12-16 NOTE — ED Notes (Signed)
Pt. Denying chest pain. States "It hurts in my abdomen where my hernia is".

## 2012-12-16 NOTE — ED Notes (Addendum)
Admitting physician at bedside. Holding antibiotics per MD request.

## 2012-12-16 NOTE — CV Procedure (Signed)
    Cardiac Cath Note  [name] [id number] 1935/03/27  Procedure: left  Heart Cardiac Catheterization Note Indications: CP, + Troponin  Procedure Details Consent: Obtained Time Out: Verified patient identification, verified procedure, site/side was marked, verified correct patient position, special equipment/implants available, Radiology Safety Procedures followed,  medications/allergies/relevent history reviewed, required imaging and test results available.  Performed   Medications: Fentanyl: 50 mcg IV Versed:   The right femoral artery was easily canulated using a modified Seldinger technique.  We used a whooley wire to access the central circulation  Hemodynamics:     LV pressure: 157/8 Aortic pressure: 157/59  Angiography   Left Main: occluded  Left anterior Descending: occluded  Left Circumflex: occluded  Right Coronary Artery: occluded  SVG to RCA:  Occluded proximally  SVG to OM: large graft. Patent stents in the body of the graft.  The anastomosis to the OM is subtotally occluded.  There is TIMI II filling of OM from the graft.  SVG to Diag:  Patent graft.  The mid graft stents are patent.  The anastomosis to the diag is normal.  There is slight filling of the LAD in a retrograde fashion from the diag    The aortic arch was heavily calcified.  The 3 great vessels were very heavily calcified with diffuse irregularities.   LIMA to LAD:  We were unable to canulate the left subclavian due to severe calcified disease.  The right carotid artery has a tight irregular stenosis at the origin.  The left subclavian has moderate disease.  LV Gram: well preserved LV function.  EF 50% -55%  Complications: No apparent complications Patient did tolerate procedure well.  Contrast used: 95 CC  Conclusions:   1. Severe native and graft disease.   2. Patent SVG to OM and SVG to diag.  I was not able to visualize the LIMA due to inability to navigate the left  subclavian.  Aortic root angiogram reveals significant disease in the right carotid and left subclavian.  I decided to not try any further attempts to get up into the left subclavian.  3. Well preserved LV function.  Will continue medical therapy.     Vesta Mixer, [name]., MD, [facility] 12/16/2012, 2:18 PM Office - [phone] Pager [phone]

## 2012-12-16 NOTE — Progress Notes (Signed)
ANTICOAGULATION CONSULT NOTE - follow up  Pharmacy Consult for heparin Indication: chest pain/ACS  No Known Allergies  Patient Measurements: Height: 6' (182.9 cm) Weight: 170 lb 9.6 oz (77.384 kg) IBW/kg (Calculated) : 77.6 Heparin Dosing Weight: 77 kg  Vital Signs: Temp: 97.7 F (36.5 C) (05/05 0600) Temp src: Oral (05/05 0600) BP: 160/61 mmHg (05/05 0600) Pulse Rate: 82 (05/05 0600)  Labs:  Recent Labs  12/16/12 0137 12/16/12 0433 12/16/12 0434 12/16/12 1059 12/16/12 1107  HGB 11.9*  --  11.9*  --   --   HCT 35.0*  --  34.3*  --   --   PLT  --   --  196  --   --   LABPROT  --   --   --  12.0  --   INR  --   --   --  0.89  --   CREATININE 0.90  --  0.91  --   --   TROPONINI  --  0.42*  --   --  0.70*    Estimated Creatinine Clearance: 75.6 ml/min (by C-G formula based on Cr of 0.91).   Assessment: Mr. Schrieber is a 78 yo man started on heparin per pharmacy for ACS/STEMI this morning.  He has CP and an elevated cardiac enzyme.  PMH sign for CAD,  CABG 95. Renal artery stent 04, B CEA 10, DES x2 and PTCA/cutting balloon 10, AOFPBPG. He did receive heparin 5000 units SQ for VTE px at ~ 8am.  His H/H is 11.9/24.3.  PLTC 196.  Troponin 0.42 and abnormal ECG.  He was given a heparin bolus of 4000 units IV x1 and a drip was started at 1000 units/hr.  Heparin was turned off for the cath lab. He is now s/p cath and Dr. Elease Hashimoto would like to resume heparin 6 hrs after sheath out.  Sheath to be removed shortly per cath lab report. Should be out by 1530 per report.    Goal of Therapy:  Heparin level 0.3-0.7 units/ml Monitor platelets by anticoagulation protocol: Yes   Plan:  1. Resume  Heparin drip at 1000 units/hr 6 hrs after sheath out tonight at 2130 2.  Daily HL/CBC while on heparin [name], Pharm.D. [phone] 12/16/2012 2:52 PM

## 2012-12-16 NOTE — H&P (View-Only) (Signed)
CARDIOLOGY CONSULT NOTE   Patient ID: ODIES DESA MRN: [id number] DOB/AGE: 1935/03/29 78 y.o.  Admit date: 12/15/2012  Primary Physician   [name], MD Primary Cardiologist   CM Reason for Consultation   Chest pain, elevated cardiac enzymes  HPI:Zaydrian Dorris Carnes Colt is a 78 y.o. male with a history of CAD.  The last several weeks, he has been having increasing episodes of epigastric/subxiphoid chest pain. It wakes him at night. He has it more frequently after eating fatty foods late at night. It has been happening almost every night recently. When he gets it, he will set up for a while and belch. The belching relieves the pain. Last p.m., he had a baloney sandwich and popcorn. Before going to bed, he had onset of subxiphoid and mid epigastric pain. He reached an 8/10. It did not radiate. There were no associated symptoms. TUMS has never helped in the past. He took his evening protonic slips upper as prescribed. When his symptoms did not resolve, he took 2 nitroglycerin with a decrease in his pain but not relief. When his symptoms did not resolve, EMS was called. His pain finally resolved in the emergency room when he was finally able to belch. It has not returned. Of note, although his oxygen saturation dropped as low as 85% in the emergency room the patient denied shortness of breath.   Past Medical History  Diagnosis Date  . CEREBROVASCULAR ACCIDENT, HX OF 03/02/2007  . CHEST PAIN 01/31/2010  . CHRONIC OBSTRUCTIVE PULMONARY DISEASE, ACUTE EXACERBATION 02/18/2009  . COPD 03/02/2007  . CORONARY ARTERY DISEASE 03/02/2007  . DEPRESSION 09/23/2007    anxiety  . DISEASE, PERIPHERAL VASCULAR NEC 03/02/2007  . DIVERTICULOSIS, COLON 09/23/2007  . FATIGUE 01/22/2009  . GERD 03/02/2007  . GLUCOSE INTOLERANCE 09/23/2007  . HEMORRHOIDS 10/09/2007  . HIATAL HERNIA 10/09/2007  . HYPERCHOLESTEROLEMIA 10/09/2007  . HYPERTENSION 09/23/2007  . HYPOKALEMIA 10/09/2007  . MESENTERIC VASCULAR INSUFFICIENCY 07/06/2008  . MITRAL  REGURGITATION 11/27/2008  . MI 10/09/2007  . Other specified forms of hearing loss 08/23/2010  . PERIPHERAL VASCULAR DISEASE 09/23/2007  . PUD 10/09/2007  . RENAL ARTERY STENOSIS 03/02/2007  . TOBACCO ABUSE 04/20/2010  . 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 Unspecified Iron Deficiency Anemia 09/23/2007  . URINARY RETENTION 01/22/2009  . VITAMIN B12 DEFICIENCY 01/03/2010  . WRIST PAIN, RIGHT 06/06/2010  . Impaired glucose tolerance 01/29/2011  . Hearing loss in left ear 01/30/2011  . Blindness of both eyes 01/30/2011  . Hyperlipidemia   . Anemia, iron deficiency   . Atrial flutter   . History of colonoscopy   . HTN (hypertension) 03/21/2011  . Peripheral arterial disease     Past Surgical History  Procedure Laterality Date  . Coronary artery bypass graft  1995    LIMA-LAD, SVG-OM, SVG-D, SVG-PDA  . Renal artery stenting  2004    bilateral  . S/p multiple le vascular bypass      includine fem-pop x 2 LLE  . S/p bilateral cea    . Coronary angioplasty with stent placement  2010    DES x 2 SVG-OM, cutting balloon PTCA SVG-PDA for ISR  . Appendectomy    . S/p lumbar disc surgury      x2  . S/p rle fem bypass feb 2011    . Cardiac catheterization    . Pr vein bypass graft,aorto-fem-pop    . Carotid endarterectomy    . Coronary angioplasty with stent placement  2011  DES to SVG-DIAG, SVG-PDA   No Known Allergies  I have reviewed the patient's current medications . amLODipine  10 mg Oral Daily  . aspirin EC  81 mg Oral Daily  . atorvastatin  40 mg Oral q1800  . cilostazol  100 mg Oral BID  . clopidogrel  75 mg Oral Daily  . ferrous sulfate  325 mg Oral TID WC  . heparin  5,000 Units Subcutaneous Q8H  . isosorbide mononitrate  60 mg Oral Daily  . lisinopril  10 mg Oral Daily  . loratadine  10 mg Oral Daily  . metoprolol  50 mg Oral BID  . pantoprazole  40 mg Oral BID WC  . sodium chloride  3 mL Intravenous Q12H     nitroGLYCERIN, nitroGLYCERIN  Medication Sig    amLODipine (NORVASC) 10 MG tablet Take 10 mg by mouth daily.  aspirin 81 MG EC tablet Take 81 mg by mouth daily.    atorvastatin (LIPITOR) 40 MG tablet Take 40 mg by mouth daily.  cilostazol (PLETAL) 100 MG tablet Take 100 mg by mouth 2 (two) times daily.  clopidogrel (PLAVIX) 75 MG tablet Take 75 mg by mouth daily.  ferrous sulfate 325 (65 FE) MG tablet Take 325 mg by mouth 3 (three) times daily.    fexofenadine (ALLEGRA) 180 MG tablet Take 180 mg by mouth daily.  isosorbide mononitrate (IMDUR) 60 MG 24 hr tablet Take 60 mg by mouth daily.  lisinopril (PRINIVIL,ZESTRIL) 10 MG tablet Take 10 mg by mouth daily.  metoprolol (LOPRESSOR) 50 MG tablet Take 50 mg by mouth 2 (two) times daily.  nitroGLYCERIN (NITROSTAT) 0.4 MG SL tablet Place 0.4 mg under the tongue every 5 (five) minutes as needed.    pantoprazole (PROTONIX) 40 MG tablet Take 1 tablet (40 mg total) by mouth 2 (two) times daily with a meal.     History   Social History  . Marital Status: Married    Spouse Name: N/A    Number of Children: 2  . Years of Education: N/A   Occupational History  . 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 Smoking status: Current Every Day Smoker -- 1.00 packs/day for 60 years    Types: Cigarettes  . Smokeless tobacco: Never Used     Comment: 1 ppd  . Alcohol Use: No  . Drug Use: No  . Sexually Active: Not on file   Other Topics Concern  . Not on file   Social History Narrative   Daily caffeine use 1/2 decaf 1/2 caffeine coffee a day    Family Status  Relation Status Death Age  . Mother Deceased   . Father Deceased   . Sister Deceased     stroke   Family History  Problem Relation Age of Onset  . Cancer Mother     colon cancer at 49yo  . Heart disease Father   . Cancer Father   . Heart disease Sister   . Cancer Brother     prostate cancer  . Stroke Sister      ROS:  Full 14 point review of systems complete and found to be  negative unless listed above.  Physical Exam: Blood pressure 160/61, pulse 82, temperature 97.7 F (36.5 C), temperature source Oral, resp. rate 18, height 6' (1.829 m), weight 170 lb 9.6 oz (77.384 kg), SpO2 96.00%.  General: Well developed, well nourished, male in no acute distress Head: Eyes PERRLA, No  xanthomas.   Normocephalic and atraumatic, oropharynx without edema or exudate. Dentition: poor  Lungs: rales and some rhonchi in bases Heart: HRRR S1 S2, no rub/gallop, 2/6 murmur. pulses are 2+ upper extrem. Decreased in both lower extremities but palpable left DP. Bilateral femoral bruits noted   Neck: No carotid bruits. No lymphadenopathy.  JVD minimal. Abdomen: Bowel sounds present, abdomen soft and non-tender without masses or hernias noted. Msk:  No spine or cva tenderness. No weakness, no joint deformities or effusions. Extremities: No clubbing or cyanosis. No edema.  Neuro: Alert and oriented X 3. No focal deficits noted. Psych:  Good affect, responds appropriately Skin: No rashes or lesions noted.  Labs:   Lab Results  Component Value Date   WBC 8.6 12/16/2012   HGB 11.9* 12/16/2012   HCT 34.3* 12/16/2012   MCV 86.4 12/16/2012   PLT 196 12/16/2012   No results found for this basename: INR,  in the last 72 hours   Recent Labs Lab 12/16/12 0434  NA 140  K 3.8  CL 105  CO2 26  BUN 8  CREATININE 0.91  CALCIUM 9.2  GLUCOSE 105*    Recent Labs  12/16/12 0433  TROPONINI 0.42*    Recent Labs  12/16/12 0135  TROPIPOC 0.02   Echo: 12/29/2009 Study Conclusions - Left ventricle: The cavity size was normal. Wall thickness was increased in a pattern of mild LVH. There was mild focal basal hypertrophy of the septum. Systolic function was normal. The estimated ejection fraction was in the range of 55% to 60%. Wall motion was normal; there were no regional wall motion abnormalities. Doppler parameters are consistent with abnormal left ventricular relaxation (grade 1 diastolic  dysfunction). - Mitral valve: Calcified annulus. Mild regurgitation. - Left atrium: The atrium was mildly dilated. - Atrial septum: There was an atrial septal aneurysm. - Pulmonic valve: The findings are consistent with mild stenosis. Impressions: - Mild SAM but no significant LVOT gradient at rest.  ECG:  16-Dec-2012 06:52:14 [facility] ROUTINE RECORD Sinus rhythm with 1st degree A-V block ST & T wave abnormality, consider lateral ischemia Prolonged QT Abnormal ECG 39mm/s 32mm/mV 100Hz  8.0.1 12SL 241 HD CID: 1 Referred by: Unconfirmed Vent. rate 90 BPM PR interval 218 ms QRS duration 106 ms QT/QTc 400/489 ms P-R-T axes 66 23 86  Radiology:  Ct Angio Chest Pe W/cm &/or Wo Cm 12/16/2012  *RADIOLOGY REPORT*  Clinical Data: 78 year old male with chest pain and pressure.  CT ANGIOGRAPHY CHEST  Technique:  Multidetector CT imaging of the chest using the standard protocol during bolus administration of intravenous contrast. Multiplanar reconstructed images including MIPs were obtained and reviewed to evaluate the vascular anatomy.  Contrast: 80mL OMNIPAQUE IOHEXOL 350 MG/ML SOLN  Comparison: Portable chest radiograph from the same day and earlier. CT abdomen 07/23/2008.  Findings: Good contrast bolus timing in the pulmonary arterial tree.  No focal filling defect identified in the pulmonary arterial tree to suggest the presence of acute pulmonary embolism.  Severe widespread atherosclerosis, mostly calcified plaque. Diffuse involvement of the aorta and great vessels.  Coronary artery involvement.  No mediastinal lymphadenopathy.  Negative visualized thyroid.  Previous median sternotomy. No acute osseous abnormality identified.  Major airways are patent.  Widespread peribronchial thickening. Mild upper lobe bronchiectasis.  Mild to moderate upper lobe predominant emphysema.  In the right hemithorax there is pleural thickening and/or trace pleural effusion.  Some small calcified  pleural plaques are noted.  None of the areas of pleural thickening  are mass-like.  There is associated subpleural scarring and reticular opacity.  There are more confluent subpleural and streaky areas of pulmonary opacity.  None of these parenchymal foci are mass like. The findings at the right lung base have progressed since 2009.  Superimposed dependent atelectasis in the right lung.  In the left lung there is also dependent atelectasis.  There is mild scarring in the lingula.  Negative visualized right upper quadrant abdominal viscera. Chronic left adrenal mass measures approximately 20 mm and appears not significantly changed since 2009.  This has densitometry of less than 10 HU today (8 HU).  IMPRESSION: 1. No evidence of acute pulmonary embolus. 2.  Chronic lung disease with widespread right lung pleural thickening, progressed at the lung base since the 2009 CT abdomen. Associated scattered subpleural and confluent scarring.   No areas suspicious for acute lung infection.  No focal suspicious or mass-like area identified in the right lung, but imaging surveillance (such as periodic chest radiographs) of this patient's lungs would be prudent. 3. Advanced widespread atherosclerosis. 4.  Chronic benign appearing left adrenal mass.  Favor adenoma.   Original Report Authenticated By: Erskine Speed, M.D.    Dg Chest Port 1 View 12/16/2012  *RADIOLOGY REPORT*  Clinical Data: 78 year old male with chest and epigastric pain.  PORTABLE CHEST - 1 VIEW  Comparison: 05/14/2012 and earlier.  Findings: AP portable semi upright view 0047 hours.  Stable lung volumes.  Stable cardiac size and mediastinal contours.  Stable sequelae of CABG.  No pneumothorax.  Chronic increased interstitial markings asymmetrically greater on the right again noted.  No pleural effusion.  Mildly increased confluent opacity on the right. Otherwise no acute pulmonary opacity.  IMPRESSION: Chronic lung disease. A small area of mild increased  confluence of opacity on the right suspicious for a right lung infectious exacerbation.   Original Report Authenticated By: Erskine Speed, M.D.     ASSESSMENT AND PLAN:   The patient was seen today by Dr. Eden Emms, the patient evaluated and the data reviewed.  Principal Problem:   Chest pain  - although there are atypical features to the chest pain, he got partial with leaflets nitroglycerin, has a long history of coronary artery disease and has an elevated troponin. Therefore, cardiac catheterization is indicated. The risks and benefits of a cardiac catheterization including, but not limited to, death, stroke, MI, kidney damage and bleeding were discussed with the patient who indicates understanding and agrees to proceed.   Otherwise, per primary M.D. Active Problems:   COPD   Signed: Leanna Battles 12/16/2012 9:36 AM Beeper [phone]  Co-Sign MD  Patient examined chart reviewed.  SEMI in patient with known CAD and previous stents to SVG;s Has not had cath in two years. Feel best approach is cath not myovue.  Given elevated troponin. No acute ECG changes. Will proceed today. Start heparin Continue plavix and check P2Y  Charlton Haws

## 2012-12-16 NOTE — Progress Notes (Signed)
4:26 PM I agree with HPI/GPe and A/P per Dr. Julian Reil  No current CP Just returned form Cardiac cath Family aware showed severe multi-vessel disease.  Patient wishing to go home      HEENT-aged, no distress CHEST-clear, no added sound CARDIAC-s1 s2 no m/r/g   Patient Active Problem List   Diagnosis Date Noted  . Peripheral vascular disease, unspecified 05/29/2012  . Chest pain 05/14/2012  . PVD (peripheral vascular disease) with claudication 11/08/2011  . PAD (peripheral artery disease) 06/07/2011  . Aftercare following surgery of the circulatory system, NEC 05/10/2011  . Peripheral neuropathy 03/21/2011  . HTN (hypertension) 03/21/2011  . Hearing loss in left ear 01/30/2011  . Eustachian tube dysfunction 01/30/2011  . Allergic rhinitis, cause unspecified 01/30/2011  . Encounter for long-term (current) use of high-risk medication 01/30/2011  . Bladder neck obstruction 01/30/2011  . Blindness of both eyes 01/30/2011  . Impaired glucose tolerance 01/29/2011  . Preventative health care 01/29/2011  . Other specified forms of hearing loss 08/23/2010  . WRIST PAIN, RIGHT 06/06/2010  . TOBACCO ABUSE 04/20/2010  . 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 CHEST PAIN 01/31/2010  . VITAMIN B12 DEFICIENCY 01/03/2010  . FATIGUE 01/22/2009  . URINARY RETENTION 01/22/2009  . MITRAL REGURGITATION 11/27/2008  . MESENTERIC VASCULAR INSUFFICIENCY 07/06/2008  . HYPERCHOLESTEROLEMIA 10/09/2007  . HYPOKALEMIA 10/09/2007  . MI 10/09/2007  . HEMORRHOIDS 10/09/2007  . PUD 10/09/2007  . HIATAL HERNIA 10/09/2007  . Unspecified Iron Deficiency Anemia 09/23/2007  . ANXIETY 09/23/2007  . DEPRESSION 09/23/2007  . DIVERTICULOSIS, COLON 09/23/2007  . CORONARY ARTERY DISEASE 03/02/2007  . RENAL ARTERY STENOSIS 03/02/2007  . DISEASE, PERIPHERAL VASCULAR NEC 03/02/2007  . COPD 03/02/2007  . GERD 03/02/2007  . CEREBROVASCULAR ACCIDENT, HX OF 03/02/2007    A/p Difficult situation-seems  to be on max med therapy, including plavix  Continue hep gtt per cards Will follow along  Pleas Koch, MD [facility] [phone]

## 2012-12-16 NOTE — ED Provider Notes (Signed)
History     CSN: [id number]  Arrival date & time 12/15/12  2347   First MD Initiated Contact with Patient 12/16/12 0015      Chief Complaint  Patient presents with  . Chest Pain   HPI [name] is a 78 y.o. male history of coronary artery disease status post CABG, multiple stents, multiple heart attacks presents with chest pain in the center of his chest it feels like pressure, started while watching TV, denies any nausea vomiting, diaphoresis, dizziness, shortness of breath, fevers or chills. Patient says occasionally this is relieved by belching. Patient was indigestion at first. Patient continued to smoke about a pack cigarettes a day   Past Medical History  Diagnosis Date  . CEREBROVASCULAR ACCIDENT, HX OF 03/02/2007  . CHEST PAIN 01/31/2010  . CHRONIC OBSTRUCTIVE PULMONARY DISEASE, ACUTE EXACERBATION 02/18/2009  . COPD 03/02/2007  . CORONARY ARTERY DISEASE 03/02/2007  . DEPRESSION 09/23/2007    anxiety  . DISEASE, PERIPHERAL VASCULAR NEC 03/02/2007  . DIVERTICULOSIS, COLON 09/23/2007  . FATIGUE 01/22/2009  . GERD 03/02/2007  . GLUCOSE INTOLERANCE 09/23/2007  . HEMORRHOIDS 10/09/2007  . HIATAL HERNIA 10/09/2007  . HYPERCHOLESTEROLEMIA 10/09/2007  . HYPERTENSION 09/23/2007  . HYPOKALEMIA 10/09/2007  . MESENTERIC VASCULAR INSUFFICIENCY 07/06/2008  . MITRAL REGURGITATION 11/27/2008  . MI 10/09/2007  . Other specified forms of hearing loss 08/23/2010  . PERIPHERAL VASCULAR DISEASE 09/23/2007  . PUD 10/09/2007  . RENAL ARTERY STENOSIS 03/02/2007  . TOBACCO ABUSE 04/20/2010  . 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 Unspecified Iron Deficiency Anemia 09/23/2007  . URINARY RETENTION 01/22/2009  . VITAMIN B12 DEFICIENCY 01/03/2010  . WRIST PAIN, RIGHT 06/06/2010  . Impaired glucose tolerance 01/29/2011  . Hearing loss in left ear 01/30/2011  . Blindness of both eyes 01/30/2011  . Hyperlipidemia   . Anemia, iron deficiency   . Atrial flutter   . History of colonoscopy   . HTN (hypertension)  03/21/2011  . Peripheral arterial disease     Past Surgical History  Procedure Laterality Date  . Coronary artery bypass graft  1995    4V  . Renal artery stenting  2004    bilateral  . S/p multiple le vascular bypass      includine fem-pop x 2 LLE  . S/p bilateral cea    . S/p ptca to the svg to diag, svg to pda and svg to om    . Appendectomy    . S/p lumbar disc surgury      x2  . S/p rle fem bypass feb 2011    . Cardiac catheterization    . Pr vein bypass graft,aorto-fem-pop    . Carotid endarterectomy      Family History  Problem Relation Age of Onset  . Cancer Mother     colon cancer at 43yo  . Heart disease Father   . Cancer Father   . Heart disease Sister   . Cancer Brother     prostate cancer  . Stroke Sister     History  Substance Use Topics  . Smoking status: Current Every Day Smoker -- 1.00 packs/day for 60 years    Types: Cigarettes  . Smokeless tobacco: Never Used     Comment: 1 ppd  . Alcohol Use: No      Review of Systems At least 10pt or greater review of systems completed and are negative except where specified in the HPI.  Allergies  Review of patient's allergies indicates no known allergies.  Home Medications   Current Outpatient Rx  Name  Route  Sig  Dispense  Refill  . aspirin 81 MG EC tablet   Oral   Take 81 mg by mouth daily.           . ferrous sulfate 325 (65 FE) MG tablet   Oral   Take 325 mg by mouth 3 (three) times daily.           Marland Kitchen EXPIRED: fexofenadine (ALLEGRA) 180 MG tablet   Oral   Take 1 tablet (180 mg total) by mouth daily.   90 tablet   3   . isosorbide mononitrate (IMDUR) 60 MG 24 hr tablet      TAKE 1 TABLET EVERY DAY   30 tablet   10   . lisinopril (PRINIVIL,ZESTRIL) 10 MG tablet      TAKE 1 TABLET (10 MG TOTAL) BY MOUTH DAILY.   30 tablet   11   . nitroGLYCERIN (NITROSTAT) 0.4 MG SL tablet   Sublingual   Place 0.4 mg under the tongue every 5 (five) minutes as needed.           .  pantoprazole (PROTONIX) 40 MG tablet   Oral   Take 1 tablet (40 mg total) by mouth 2 (two) times daily with a meal.   90 tablet   3     BP 160/67  Pulse 101  Temp(Src) 97.5 F (36.4 C) (Oral)  Resp 18  SpO2 95%  Physical Exam  Nursing notes reviewed.  Electronic medical record reviewed. VITAL SIGNS:   Filed Vitals:   12/15/12 2358  BP: 160/67  Pulse: 101  Temp: 97.5 F (36.4 C)  TempSrc: Oral  Resp: 18  SpO2: 95%   CONSTITUTIONAL: Awake, oriented, appears non-toxic HENT: Atraumatic, normocephalic, oral mucosa pink and moist, airway patent. Nares patent without drainage. External ears normal. EYES: Conjunctiva clear, EOMI, PERRLA NECK: Trachea midline, non-tender, supple CARDIOVASCULAR: Normal heart rate, Normal rhythm, systolic murmur PULMONARY/CHEST: Clear to auscultation, no rhonchi, wheezes, or rales. Symmetrical breath sounds. Non-tender. Well-healed median sternotomy scar ABDOMINAL: Non-distended, soft, non-tender - no rebound or guarding.  BS normal. NEUROLOGIC: Non-focal, moving all four extremities, no gross sensory or motor deficits. EXTREMITIES: No clubbing, cyanosis, or edema SKIN: Warm, Dry, No erythema, No rash   ED Course  Procedures (including critical care time)  Date: 12/16/2012  Rate: 107  Rhythm:  Sinus tach  QRS Axis: normal  Intervals: normal  ST/T Wave abnormalities: ST depressions most prominent V4 through V6 which are new, T wave inversions in 1 and aVL are old, T-wave inversions in avf  Conduction Disutrbances: none  Narrative Interpretation: unremarkable  Labs Reviewed  TROPONIN I - Abnormal; Notable for the following:    Troponin I 0.42 (*)    All other components within normal limits  CBC - Abnormal; Notable for the following:    RBC 3.97 (*)    Hemoglobin 11.9 (*)    HCT 34.3 (*)    All other components within normal limits  BASIC METABOLIC PANEL - Abnormal; Notable for the following:    Glucose, Bld 105 (*)    GFR calc non Af  Amer 80 (*)    All other components within normal limits  POCT I-STAT, CHEM 8 - Abnormal; Notable for the following:    Potassium 3.4 (*)    Glucose, Bld 135 (*)    Hemoglobin 11.9 (*)    HCT 35.0 (*)    All other components within normal  limits  POCT I-STAT TROPONIN I      Labs Reviewed - No data to display Ct Angio Chest Pe W/cm &/or Wo Cm  12/16/2012  *RADIOLOGY REPORT*  Clinical Data: 78 year old male with chest pain and pressure.  CT ANGIOGRAPHY CHEST  Technique:  Multidetector CT imaging of the chest using the standard protocol during bolus administration of intravenous contrast. Multiplanar reconstructed images including MIPs were obtained and reviewed to evaluate the vascular anatomy.  Contrast: 80mL OMNIPAQUE IOHEXOL 350 MG/ML SOLN  Comparison: Portable chest radiograph from the same day and earlier. CT abdomen 07/23/2008.  Findings: Good contrast bolus timing in the pulmonary arterial tree.  No focal filling defect identified in the pulmonary arterial tree to suggest the presence of acute pulmonary embolism.  Severe widespread atherosclerosis, mostly calcified plaque. Diffuse involvement of the aorta and great vessels.  Coronary artery involvement.  No mediastinal lymphadenopathy.  Negative visualized thyroid.  Previous median sternotomy. No acute osseous abnormality identified.  Major airways are patent.  Widespread peribronchial thickening. Mild upper lobe bronchiectasis.  Mild to moderate upper lobe predominant emphysema.  In the right hemithorax there is pleural thickening and/or trace pleural effusion.  Some small calcified pleural plaques are noted.  None of the areas of pleural thickening are mass-like.  There is associated subpleural scarring and reticular opacity.  There are more confluent subpleural and streaky areas of pulmonary opacity.  None of these parenchymal foci are mass like. The findings at the right lung base have progressed since 2009.  Superimposed dependent atelectasis  in the right lung.  In the left lung there is also dependent atelectasis.  There is mild scarring in the lingula.  Negative visualized right upper quadrant abdominal viscera. Chronic left adrenal mass measures approximately 20 mm and appears not significantly changed since 2009.  This has densitometry of less than 10 HU today (8 HU).  IMPRESSION: 1. No evidence of acute pulmonary embolus. 2.  Chronic lung disease with widespread right lung pleural thickening, progressed at the lung base since the 2009 CT abdomen. Associated scattered subpleural and confluent scarring.   No areas suspicious for acute lung infection.  No focal suspicious or mass-like area identified in the right lung, but imaging surveillance (such as periodic chest radiographs) of this patient's lungs would be prudent. 3. Advanced widespread atherosclerosis. 4.  Chronic benign appearing left adrenal mass.  Favor adenoma.   Original Report Authenticated By: Erskine Speed, M.D.    Dg Chest Port 1 View  12/16/2012  *RADIOLOGY REPORT*  Clinical Data: 78 year old male with chest and epigastric pain.  PORTABLE CHEST - 1 VIEW  Comparison: 05/14/2012 and earlier.  Findings: AP portable semi upright view 0047 hours.  Stable lung volumes.  Stable cardiac size and mediastinal contours.  Stable sequelae of CABG.  No pneumothorax.  Chronic increased interstitial markings asymmetrically greater on the right again noted.  No pleural effusion.  Mildly increased confluent opacity on the right. Otherwise no acute pulmonary opacity.  IMPRESSION: Chronic lung disease. A small area of mild increased confluence of opacity on the right suspicious for a right lung infectious exacerbation.   Original Report Authenticated By: Erskine Speed, M.D.      1. Chest pain   2. Chronic airway obstruction, not elsewhere classified       MDM  Patient's extensive medical history including cardiac history presents with epigastric pain, it's getting better on belching however the  patient does have some EKG changes including ST depressions in the lateral leads.  Patient does have an elevated troponin, chest x-ray was questionable for a new opacity, discussed patient's case with hospitalist Dr. Julian Reil, obtain CT of the chest, no pneumonia or pulmonary embolism seen. No dissection. Patient to be admitted for cardiac rule out as well as suboptimal pulmonary function, patient desaturated into the 70s while sleeping likely sleep apnea.        Jones Skene, MD 12/16/12 4540

## 2012-12-16 NOTE — H&P (Signed)
[facility] History and Physical  [name]:[id number] DOB: 1935-03-21 DOA: 12/15/2012  Referring physician: ED PCP: [name], MD  Specialists: None  Chief Complaint: Chest pain  HPI: [name] is a 78 y.o. male who presents to the ED with c/o chest pressure that started 1 hour ago while watching TV.  He denies N/V dizziness, SOB.  He does have a different chest pain on exertion which he states has been stable for several years since his last cardiac procedure (his wife chimes in at this point stating but it has been becoming more frequent recently).  The patient states that this new chest pressure is in the center of his chest and epigastric area, worse after eating, and has completely resolved at this time.  In the ED patient was noted to be hypoxic at times desating down to 85% on the monitor (he takes no oxygen at home and states even while desating that he feels no more short of breath than usual).  Hospitalist has been asked to admit.  Review of Systems: No productive cough, no fever, no chills, no SOB, 12 systems reviewed and otherwise negative.  Past Medical History  Diagnosis Date  . CEREBROVASCULAR ACCIDENT, HX OF 03/02/2007  . CHEST PAIN 01/31/2010  . CHRONIC OBSTRUCTIVE PULMONARY DISEASE, ACUTE EXACERBATION 02/18/2009  . COPD 03/02/2007  . CORONARY ARTERY DISEASE 03/02/2007  . DEPRESSION 09/23/2007    anxiety  . DISEASE, PERIPHERAL VASCULAR NEC 03/02/2007  . DIVERTICULOSIS, COLON 09/23/2007  . FATIGUE 01/22/2009  . GERD 03/02/2007  . GLUCOSE INTOLERANCE 09/23/2007  . HEMORRHOIDS 10/09/2007  . HIATAL HERNIA 10/09/2007  . HYPERCHOLESTEROLEMIA 10/09/2007  . HYPERTENSION 09/23/2007  . HYPOKALEMIA 10/09/2007  . MESENTERIC VASCULAR INSUFFICIENCY 07/06/2008  . MITRAL REGURGITATION 11/27/2008  . MI 10/09/2007  . Other specified forms of hearing loss 08/23/2010  . PERIPHERAL VASCULAR DISEASE 09/23/2007  . PUD 10/09/2007  . RENAL ARTERY STENOSIS 03/02/2007  . TOBACCO ABUSE 04/20/2010  .  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 Unspecified Iron Deficiency Anemia 09/23/2007  . URINARY RETENTION 01/22/2009  . VITAMIN B12 DEFICIENCY 01/03/2010  . WRIST PAIN, RIGHT 06/06/2010  . Impaired glucose tolerance 01/29/2011  . Hearing loss in left ear 01/30/2011  . Blindness of both eyes 01/30/2011  . Hyperlipidemia   . Anemia, iron deficiency   . Atrial flutter   . History of colonoscopy   . HTN (hypertension) 03/21/2011  . Peripheral arterial disease    Past Surgical History  Procedure Laterality Date  . Coronary artery bypass graft  1995    4V  . Renal artery stenting  2004    bilateral  . S/p multiple le vascular bypass      includine fem-pop x 2 LLE  . S/p bilateral cea    . S/p ptca to the svg to diag, svg to pda and svg to om    . Appendectomy    . S/p lumbar disc surgury      x2  . S/p rle fem bypass feb 2011    . Cardiac catheterization    . Pr vein bypass graft,aorto-fem-pop    . Carotid endarterectomy     Social History:  reports that he has been smoking Cigarettes.  He has a 60 pack-year smoking history. He has never used smokeless tobacco. He reports that he does not drink alcohol or use illicit drugs.   No Known Allergies  Family History  Problem Relation Age of Onset  . Cancer Mother  colon cancer at 42yo  . Heart disease Father   . Cancer Father   . Heart disease Sister   . Cancer Brother     prostate cancer  . Stroke Sister     Prior to Admission medications   Medication Sig Start Date End Date Taking? Authorizing Provider  amLODipine (NORVASC) 10 MG tablet Take 10 mg by mouth daily.   Yes Historical Provider, MD  aspirin 81 MG EC tablet Take 81 mg by mouth daily.     Yes Historical Provider, MD  atorvastatin (LIPITOR) 40 MG tablet Take 40 mg by mouth daily.   Yes Historical Provider, MD  cilostazol (PLETAL) 100 MG tablet Take 100 mg by mouth 2 (two) times daily.   Yes Historical Provider, MD  clopidogrel (PLAVIX) 75 MG tablet Take 75 mg  by mouth daily.   Yes Historical Provider, MD  ferrous sulfate 325 (65 FE) MG tablet Take 325 mg by mouth 3 (three) times daily.     Yes Historical Provider, MD  fexofenadine (ALLEGRA) 180 MG tablet Take 180 mg by mouth daily.   Yes Historical Provider, MD  isosorbide mononitrate (IMDUR) 60 MG 24 hr tablet Take 60 mg by mouth daily.   Yes Historical Provider, MD  lisinopril (PRINIVIL,ZESTRIL) 10 MG tablet Take 10 mg by mouth daily.   Yes Historical Provider, MD  metoprolol (LOPRESSOR) 50 MG tablet Take 50 mg by mouth 2 (two) times daily.   Yes Historical Provider, MD  nitroGLYCERIN (NITROSTAT) 0.4 MG SL tablet Place 0.4 mg under the tongue every 5 (five) minutes as needed.     Yes Historical Provider, MD  pantoprazole (PROTONIX) 40 MG tablet Take 1 tablet (40 mg total) by mouth 2 (two) times daily with a meal. 05/14/12  Yes Corwin Levins, MD   Physical Exam: Filed Vitals:   12/15/12 2358 12/16/12 0414  BP: 160/67 141/65  Pulse: 101 86  Temp: 97.5 F (36.4 C)   TempSrc: Oral   Resp: 18 22  SpO2: 95% 96%    General:  NAD, resting comfortably in bed Eyes: PEERLA EOMI ENT: mucous membranes moist Neck: supple w/o JVD Cardiovascular: RRR w/o MRG Respiratory: rales on the R side Abdomen: soft, nt, nd, bs+ Skin: no rash nor lesion Musculoskeletal: MAE, full ROM all 4 extremities Psychiatric: normal tone and affect Neurologic: AAOx3, grossly non-focal   Labs on Admission:  Basic Metabolic Panel:  Recent Labs Lab 12/16/12 0137  NA 140  K 3.4*  CL 104  GLUCOSE 135*  BUN 7  CREATININE 0.90   Liver Function Tests: No results found for this basename: AST, ALT, ALKPHOS, BILITOT, PROT, ALBUMIN,  in the last 168 hours No results found for this basename: LIPASE, AMYLASE,  in the last 168 hours No results found for this basename: AMMONIA,  in the last 168 hours CBC:  Recent Labs Lab 12/16/12 0137  HGB 11.9*  HCT 35.0*   Cardiac Enzymes: No results found for this basename:  CKTOTAL, CKMB, CKMBINDEX, TROPONINI,  in the last 168 hours  BNP (last 3 results) No results found for this basename: PROBNP,  in the last 8760 hours CBG: No results found for this basename: GLUCAP,  in the last 168 hours  Radiological Exams on Admission: Ct Angio Chest Pe W/cm &/or Wo Cm  12/16/2012  *RADIOLOGY REPORT*  Clinical Data: 78 year old male with chest pain and pressure.  CT ANGIOGRAPHY CHEST  Technique:  Multidetector CT imaging of the chest using the standard protocol during bolus  administration of intravenous contrast. Multiplanar reconstructed images including MIPs were obtained and reviewed to evaluate the vascular anatomy.  Contrast: 80mL OMNIPAQUE IOHEXOL 350 MG/ML SOLN  Comparison: Portable chest radiograph from the same day and earlier. CT abdomen 07/23/2008.  Findings: Good contrast bolus timing in the pulmonary arterial tree.  No focal filling defect identified in the pulmonary arterial tree to suggest the presence of acute pulmonary embolism.  Severe widespread atherosclerosis, mostly calcified plaque. Diffuse involvement of the aorta and great vessels.  Coronary artery involvement.  No mediastinal lymphadenopathy.  Negative visualized thyroid.  Previous median sternotomy. No acute osseous abnormality identified.  Major airways are patent.  Widespread peribronchial thickening. Mild upper lobe bronchiectasis.  Mild to moderate upper lobe predominant emphysema.  In the right hemithorax there is pleural thickening and/or trace pleural effusion.  Some small calcified pleural plaques are noted.  None of the areas of pleural thickening are mass-like.  There is associated subpleural scarring and reticular opacity.  There are more confluent subpleural and streaky areas of pulmonary opacity.  None of these parenchymal foci are mass like. The findings at the right lung base have progressed since 2009.  Superimposed dependent atelectasis in the right lung.  In the left lung there is also  dependent atelectasis.  There is mild scarring in the lingula.  Negative visualized right upper quadrant abdominal viscera. Chronic left adrenal mass measures approximately 20 mm and appears not significantly changed since 2009.  This has densitometry of less than 10 HU today (8 HU).  IMPRESSION: 1. No evidence of acute pulmonary embolus. 2.  Chronic lung disease with widespread right lung pleural thickening, progressed at the lung base since the 2009 CT abdomen. Associated scattered subpleural and confluent scarring.   No areas suspicious for acute lung infection.  No focal suspicious or mass-like area identified in the right lung, but imaging surveillance (such as periodic chest radiographs) of this patient's lungs would be prudent. 3. Advanced widespread atherosclerosis. 4.  Chronic benign appearing left adrenal mass.  Favor adenoma.   Original Report Authenticated By: Erskine Speed, M.D.    Dg Chest Port 1 View  12/16/2012  *RADIOLOGY REPORT*  Clinical Data: 78 year old male with chest and epigastric pain.  PORTABLE CHEST - 1 VIEW  Comparison: 05/14/2012 and earlier.  Findings: AP portable semi upright view 0047 hours.  Stable lung volumes.  Stable cardiac size and mediastinal contours.  Stable sequelae of CABG.  No pneumothorax.  Chronic increased interstitial markings asymmetrically greater on the right again noted.  No pleural effusion.  Mildly increased confluent opacity on the right. Otherwise no acute pulmonary opacity.  IMPRESSION: Chronic lung disease. A small area of mild increased confluence of opacity on the right suspicious for a right lung infectious exacerbation.   Original Report Authenticated By: Erskine Speed, M.D.     EKG: Independently reviewed.  ST segment depressions in the anterior and lateral leads more pronounced than previously.  Assessment/Plan Principal Problem:   Chest pain Active Problems:   COPD   1. Chest pain - serial troponins, tele monitor, may need cards consult in  AM, NPO, ST segment depressions more pronounced, patient dosent think the pain he was having tonight was cardiac, but wife seems worried given more frequent chest pains on exertion recently. 2. COPD - patient hasnt seen a pulmonologist or had a formal PFT, but with a 60 pack year h/o smoking strongly suspect this (CT scan also showed chronic lung disease findings), not in exacerbation at this  point but given desaturations with any activity patient likely will qualify for home O2 (even though he isnt short of breath).    Code Status: Full Code (must indicate code status--if unknown or must be presumed, indicate so) Family Communication: Spoke with wife and daughter at bedside (indicate person spoken with, if applicable, with phone number if by telephone) Disposition Plan: Admit to obs (indicate anticipated LOS)  Time spent: 70 min  GARDNER, JARED M. [facility] Pager [phone]  If 7PM-7AM, please contact night-coverage [email or web address] Password [facility] 12/16/2012, 4:35 AM

## 2012-12-17 DIAGNOSIS — I219 Acute myocardial infarction, unspecified: Secondary | ICD-10-CM

## 2012-12-17 DIAGNOSIS — I2510 Atherosclerotic heart disease of native coronary artery without angina pectoris: Secondary | ICD-10-CM

## 2012-12-17 LAB — CBC
HCT: 35.3 % — ABNORMAL LOW (ref 39.0–52.0)
Hemoglobin: 12.2 g/dL — ABNORMAL LOW (ref 13.0–17.0)
RBC: 4.12 MIL/uL — ABNORMAL LOW (ref 4.22–5.81)

## 2012-12-17 LAB — HEPARIN LEVEL (UNFRACTIONATED): Heparin Unfractionated: <0.1 IU/mL — ABNORMAL LOW (ref 0.30–0.70)

## 2012-12-17 MED ORDER — NITROGLYCERIN 0.4 MG SL SUBL: 0.4000 mg | SUBLINGUAL_TABLET | SUBLINGUAL | Status: DC | PRN

## 2012-12-17 NOTE — Progress Notes (Signed)
ANTICOAGULATION CONSULT NOTE - Pharmacy Consult for heparin Indication: chest pain/ACS  No Known Allergies  Patient Measurements: Height: 6' (182.9 cm) Weight: 170 lb 9.6 oz (77.384 kg) IBW/kg (Calculated) : 77.6 Heparin Dosing Weight: 77 kg  Vital Signs: Temp: 98.4 F (36.9 C) (05/06 0541) Temp src: Oral (05/06 0541) BP: 146/66 mmHg (05/06 0541) Pulse Rate: 69 (05/06 0541)  Labs:  Recent Labs  12/16/12 0137 12/16/12 0433 12/16/12 0434 12/16/12 1059 12/16/12 1107 12/16/12 1619 12/17/12 0540  HGB 11.9*  --  11.9*  --   --   --  12.2*  HCT 35.0*  --  34.3*  --   --   --  35.3*  PLT  --   --  196  --   --   --  193  LABPROT  --   --   --  12.0  --   --   --   INR  --   --   --  0.89  --   --   --   HEPARINUNFRC  --   --   --   --   --   --  <0.10*  CREATININE 0.90  --  0.91  --   --   --   --   TROPONINI  --  0.42*  --   --  0.70* 0.74*  --     Estimated Creatinine Clearance: 75.6 ml/min (by C-G formula based on Cr of 0.91).   Assessment: 78 yo male with multivessel CAD s/p cath for heparin  Goal of Therapy:  Heparin level 0.3-0.7 units/ml Monitor platelets by anticoagulation protocol: Yes   Plan:  Increase Heparin 1300 units/hr Check heparin level in 8 hours.  Geannie Risen, PharmD, BCPS 12/17/2012 6:23 AM

## 2012-12-17 NOTE — Progress Notes (Signed)
D/c orders received;IV removed with gauze on, pt remains in stable condition, pt meds and instructions reviewed and given to pt; pt d/c to home 

## 2012-12-17 NOTE — Discharge Summary (Signed)
Physician Discharge Summary  [name] GNF:[id number] DOB: 1935-07-29 DOA: 12/15/2012  PCP: [name], MD  Admit date: 12/15/2012 Discharge date: 12/17/2012  Time spent: 45 minutes  Recommendations for Outpatient Follow-up:  1. Patient may need GI involvement as an outpatient for abdominal discomfort 2. Followup with cardiologist in 3-4 weeks 3. Potentially may need to goals of care as there is no further intervention that can help him with chest   Discharge Diagnoses:  Principal Problem:   Chest pain Active Problems:   COPD   Discharge Condition: Fair  Diet recommendation: Heart Healthy  Filed Weights   12/16/12 0600  Weight: 77.384 kg (170 lb 9.6 oz)    History of present illness:  78 year old Caucasian male with extensive past cardiac history was admitted to [facility] 12/16/12 with CP.  Because of his signifiant vascular disease and prior CAd h/o, Cardiology was consulted-atient had a cardiac catheterization showing extensive disease but no significant target that was amenable to PCI or intervention. He was transiently on heparin drip to rule out ACS and subsequently was kept on similar medications as he had been on from admission as he was on max medical therapy he was occasionally hypoxic. It was felt that he had met maximal medical therapy benefits in the hospital and he was subsequently discharged 12/17/2012 with instructions to followup closely with cardiologist and primary care physician with instructions to closely followup in the outpatient setting Patient also complains of some reflux-like pain and pressure which has been going on for a while. He is on appropriate therapies and this may need GI involvement in the outpatient setting   Discharge Exam: Filed Vitals:   12/16/12 1322 12/16/12 1535 12/16/12 2031 12/17/12 0541  BP:  154/60 140/63 146/66  Pulse: 79 70 99 69  Temp:  97.8 F (36.6 C) 98.3 F (36.8 C) 98.4 F (36.9 C)  TempSrc:  Oral Oral Oral  Resp:   18 18 18   Height:      Weight:      SpO2:  93% 95% 93%   Alert pleasant oriented  General: No pallor no icterus edentulous Cardiovascular: S1-S2 no murmur rub or gallop Respiratory: Clinically clear  Discharge Instructions  Discharge Orders   Future Appointments Provider Department Dept Phone   06/04/2013 10:00 AM [facility] [phone]   06/04/2013 10:30 AM [facility] [phone]   06/04/2013 11:00 AM Evern Bio, NP [facility] [phone]   Future Orders Complete By Expires     Call MD for:  difficulty breathing, headache or visual disturbances  As directed     Call MD for:  persistant dizziness or light-headedness  As directed     Call MD for:  redness, tenderness, or signs of infection (pain, swelling, redness, odor or green/yellow discharge around incision site)  As directed     Call MD for:  severe uncontrolled pain  As directed     Call MD for:  temperature >100.4  As directed     Diet - low sodium heart healthy  As directed     Increase activity slowly  As directed         Medication List    TAKE these medications       amLODipine 10 MG tablet  Commonly known as:  NORVASC  Take 10 mg by mouth daily.     aspirin 81 MG EC tablet  Take 81 mg by mouth  daily.     atorvastatin 40 MG tablet  Commonly known as:  LIPITOR  Take 40 mg by mouth daily.     cilostazol 100 MG tablet  Commonly known as:  PLETAL  Take 100 mg by mouth 2 (two) times daily.     clopidogrel 75 MG tablet  Commonly known as:  PLAVIX  Take 75 mg by mouth daily.     ferrous sulfate 325 (65 FE) MG tablet  Take 325 mg by mouth 3 (three) times daily.     fexofenadine 180 MG tablet  Commonly known as:  ALLEGRA  Take 180 mg by mouth daily.     isosorbide mononitrate 60 MG 24 hr tablet  Commonly known as:  IMDUR  Take 60 mg by mouth daily.     lisinopril 10 MG tablet   Commonly known as:  PRINIVIL,ZESTRIL  Take 10 mg by mouth daily.     metoprolol 50 MG tablet  Commonly known as:  LOPRESSOR  Take 50 mg by mouth 2 (two) times daily.     nitroGLYCERIN 0.4 MG SL tablet  Commonly known as:  NITROSTAT  Place 1 tablet (0.4 mg total) under the tongue every 5 (five) minutes as needed for chest pain.     pantoprazole 40 MG tablet  Commonly known as:  PROTONIX  Take 1 tablet (40 mg total) by mouth 2 (two) times daily with a meal.       No Known Allergies    The results of significant diagnostics from this hospitalization (including imaging, microbiology, ancillary and laboratory) are listed below for reference.    Significant Diagnostic Studies: Ct Angio Chest Pe W/cm &/or Wo Cm  12/16/2012  *RADIOLOGY REPORT*  Clinical Data: 78 year old male with chest pain and pressure.  CT ANGIOGRAPHY CHEST  Technique:  Multidetector CT imaging of the chest using the standard protocol during bolus administration of intravenous contrast. Multiplanar reconstructed images including MIPs were obtained and reviewed to evaluate the vascular anatomy.  Contrast: 80mL OMNIPAQUE IOHEXOL 350 MG/ML SOLN  Comparison: Portable chest radiograph from the same day and earlier. CT abdomen 07/23/2008.  Findings: Good contrast bolus timing in the pulmonary arterial tree.  No focal filling defect identified in the pulmonary arterial tree to suggest the presence of acute pulmonary embolism.  Severe widespread atherosclerosis, mostly calcified plaque. Diffuse involvement of the aorta and great vessels.  Coronary artery involvement.  No mediastinal lymphadenopathy.  Negative visualized thyroid.  Previous median sternotomy. No acute osseous abnormality identified.  Major airways are patent.  Widespread peribronchial thickening. Mild upper lobe bronchiectasis.  Mild to moderate upper lobe predominant emphysema.  In the right hemithorax there is pleural thickening and/or trace pleural effusion.  Some  small calcified pleural plaques are noted.  None of the areas of pleural thickening are mass-like.  There is associated subpleural scarring and reticular opacity.  There are more confluent subpleural and streaky areas of pulmonary opacity.  None of these parenchymal foci are mass like. The findings at the right lung base have progressed since 2009.  Superimposed dependent atelectasis in the right lung.  In the left lung there is also dependent atelectasis.  There is mild scarring in the lingula.  Negative visualized right upper quadrant abdominal viscera. Chronic left adrenal mass measures approximately 20 mm and appears not significantly changed since 2009.  This has densitometry of less than 10 HU today (8 HU).  IMPRESSION: 1. No evidence of acute pulmonary embolus. 2.  Chronic lung disease with widespread right  lung pleural thickening, progressed at the lung base since the 2009 CT abdomen. Associated scattered subpleural and confluent scarring.   No areas suspicious for acute lung infection.  No focal suspicious or mass-like area identified in the right lung, but imaging surveillance (such as periodic chest radiographs) of this patient's lungs would be prudent. 3. Advanced widespread atherosclerosis. 4.  Chronic benign appearing left adrenal mass.  Favor adenoma.   Original Report Authenticated By: Erskine Speed, M.D.    Dg Chest Port 1 View  12/16/2012  *RADIOLOGY REPORT*  Clinical Data: 78 year old male with chest and epigastric pain.  PORTABLE CHEST - 1 VIEW  Comparison: 05/14/2012 and earlier.  Findings: AP portable semi upright view 0047 hours.  Stable lung volumes.  Stable cardiac size and mediastinal contours.  Stable sequelae of CABG.  No pneumothorax.  Chronic increased interstitial markings asymmetrically greater on the right again noted.  No pleural effusion.  Mildly increased confluent opacity on the right. Otherwise no acute pulmonary opacity.  IMPRESSION: Chronic lung disease. A small area of mild  increased confluence of opacity on the right suspicious for a right lung infectious exacerbation.   Original Report Authenticated By: Erskine Speed, M.D.     Microbiology: No results found for this or any previous visit (from the past 240 hour(s)).   Labs: Basic Metabolic Panel:  Recent Labs Lab 12/16/12 0137 12/16/12 0434  NA 140 140  K 3.4* 3.8  CL 104 105  CO2  --  26  GLUCOSE 135* 105*  BUN 7 8  CREATININE 0.90 0.91  CALCIUM  --  9.2   Liver Function Tests: No results found for this basename: AST, ALT, ALKPHOS, BILITOT, PROT, ALBUMIN,  in the last 168 hours No results found for this basename: LIPASE, AMYLASE,  in the last 168 hours No results found for this basename: AMMONIA,  in the last 168 hours CBC:  Recent Labs Lab 12/16/12 0137 12/16/12 0434 12/17/12 0540  WBC  --  8.6 6.1  HGB 11.9* 11.9* 12.2*  HCT 35.0* 34.3* 35.3*  MCV  --  86.4 85.7  PLT  --  196 193   Cardiac Enzymes:  Recent Labs Lab 12/16/12 0433 12/16/12 1107 12/16/12 1619  TROPONINI 0.42* 0.70* 0.74*   BNP: BNP (last 3 results) No results found for this basename: PROBNP,  in the last 8760 hours CBG: No results found for this basename: GLUCAP,  in the last 168 hours     Signed:  Rhetta Mura  [facility] 12/17/2012, 8:49 AM

## 2012-12-17 NOTE — Progress Notes (Signed)
    SUBJECTIVE:No chest pain this am.   BP 146/66  Pulse 69  Temp(Src) 98.4 F (36.9 C) (Oral)  Resp 18  Ht 6' (1.829 m)  Wt 170 lb 9.6 oz (77.384 kg)  BMI 23.13 kg/m2  SpO2 93%  Intake/Output Summary (Last 24 hours) at 12/17/12 0729 Last data filed at 12/17/12 0542  Gross per 24 hour  Intake  317.4 ml  Output   1200 ml  Net -882.6 ml    PHYSICAL EXAM General: Well developed, well nourished, in no acute distress. Alert and oriented x 3.  Psych:  Good affect, responds appropriately Neck: No JVD. No masses noted.  Lungs: Clear bilaterally with no wheezes or rhonci noted.  Heart: RRR with no murmurs noted. Abdomen: Bowel sounds are present. Soft, non-tender.  Extremities: No lower extremity edema. Right groin without hematoma.   LABS: Basic Metabolic Panel:  Recent Labs  16/10/96 0137 12/16/12 0434  NA 140 140  K 3.4* 3.8  CL 104 105  CO2  --  26  GLUCOSE 135* 105*  BUN 7 8  CREATININE 0.90 0.91  CALCIUM  --  9.2   CBC:  Recent Labs  12/16/12 0434 12/17/12 0540  WBC 8.6 6.1  HGB 11.9* 12.2*  HCT 34.3* 35.3*  MCV 86.4 85.7  PLT 196 193   Cardiac Enzymes:  Recent Labs  12/16/12 0433 12/16/12 1107 12/16/12 1619  TROPONINI 0.42* 0.70* 0.74*   Current Meds: . amLODipine  10 mg Oral Daily  . aspirin EC  81 mg Oral Daily  . atorvastatin  40 mg Oral q1800  . cilostazol  100 mg Oral BID  . clopidogrel  75 mg Oral Daily  . ferrous sulfate  325 mg Oral TID WC  . isosorbide mononitrate  60 mg Oral Daily  . lisinopril  10 mg Oral Daily  . loratadine  10 mg Oral Daily  . metoprolol  50 mg Oral BID  . pantoprazole  40 mg Oral BID WC  . sodium chloride  3 mL Intravenous Q12H     ASSESSMENT AND PLAN:  1. CAD/NSTEMI: Pt with severe, end stage CAD s/p CABG with progression of graft disease. He has complete occlusion of his native coronaries proximally in all vessels. His LIMA graft to LAD is presumed to be patent but cannot be engaged secondary to  calcified, tortuous aorta and subclavian artery. The SVG to the distal RCA had been patent at last cath in 2012 and is now occluded. The vein grafts to the Diagonal and the OM are patent. No good options for PCI. Medical management has been recommended. D/C heparin this am. Continue ASA, Plavix, statin, long acting nitrate, beta blocker, Ace-inh.  OK to d/c home today. He can f/u with me in 3-4 weeks.   MCALHANY,CHRISTOPHER  5/6/20147:29 AM

## 2012-12-18 ENCOUNTER — Telehealth: Payer: Self-pay | Admitting: Cardiovascular Disease

## 2012-12-18 NOTE — Telephone Encounter (Signed)
New problem   Pt was discharged from hx 12/16/12 and daughter has questions concerning his condition. She stated they was given different information concerning her dad.Please call pt.

## 2012-12-18 NOTE — Telephone Encounter (Signed)
Appointment was made for May 28 th per Dr. Clifton James MD.  Spoke with pt's daughter Clydie Braun, she is aware that pt's has an appointment on May 28 th at 2:45 PM.

## 2012-12-18 NOTE — Telephone Encounter (Signed)
How about 2:45 on May 28th? chris

## 2012-12-18 NOTE — Telephone Encounter (Signed)
Post cath patient was D/C from the hospital 12/17/12. Daughter  pt was told that nothing can be done surgically for pt. That  Pt's disease will be managed medically. Pt's daughter said that pt was only given a NTG prescription. Daughter was made aware that pt was being manege with Asprin, Plavix, Nitrates and statins, and pt is already taken these medications, and MD will be evaluate  during the post-op office visit. Pt is to have a post op visit in 3 to 4 weeks . Pt would like to see Dr. Clifton James not the PA. Pt is aware that Mcalhany's next open appointment is June 30 th. Daughter and pt would like to be seen sooner,  in 3 weeks if possible.

## 2012-12-20 ENCOUNTER — Ambulatory Visit (INDEPENDENT_AMBULATORY_CARE_PROVIDER_SITE_OTHER): Payer: Medicare Other | Admitting: Internal Medicine

## 2012-12-20 ENCOUNTER — Encounter: Payer: Self-pay | Admitting: Internal Medicine

## 2012-12-20 VITALS — BP 130/62 | HR 78 | Temp 98.3°F | Ht 72.0 in | Wt 170.5 lb

## 2012-12-20 DIAGNOSIS — F411 Generalized anxiety disorder: Secondary | ICD-10-CM

## 2012-12-20 DIAGNOSIS — M542 Cervicalgia: Secondary | ICD-10-CM | POA: Insufficient documentation

## 2012-12-20 DIAGNOSIS — R1013 Epigastric pain: Secondary | ICD-10-CM

## 2012-12-20 MED ORDER — ESOMEPRAZOLE MAGNESIUM 40 MG PO CPDR: 40.0000 mg | DELAYED_RELEASE_CAPSULE | Freq: Every day | ORAL | Status: DC

## 2012-12-20 MED ORDER — TRAMADOL HCL 50 MG PO TABS: 50.0000 mg | ORAL_TABLET | Freq: Three times a day (TID) | ORAL | Status: DC | PRN

## 2012-12-20 MED ORDER — SERTRALINE HCL 50 MG PO TABS: 50.0000 mg | ORAL_TABLET | Freq: Every day | ORAL | Status: DC

## 2012-12-20 NOTE — Assessment & Plan Note (Signed)
Unclear etiology, for change to nexium, refer GI

## 2012-12-20 NOTE — Progress Notes (Signed)
Subjective:    [name], male    DOB: 11-09-1934, 78 y.o.   MRN: [id number]  HPI  Here to f/u recent hospn with CP/elev troponin, cath for med tx;  No further CP since d/c, but with persistent epigastric dyspeptic symptoms and decressed appetite despite protonix, and no dysphgia, wt loss.  Pt denies chest pain, increased sob or doe, wheezing, orthopnea, PND, increased LE swelling, palpitations, dizziness or syncope.  Pt denies new neurological symptoms such as new headache, or facial or extremity weakness or numbness except for ongoing mild LUE weakness no change.  Has neck pain after sleeping 2 days ago without radicular symptoms.  Denies worsening depressive symptoms, suicidal ideation, or panic; has ongoing anxiety, increased recently with mult stressors.  Past Medical History  Diagnosis Date  . CEREBROVASCULAR ACCIDENT, HX OF 03/02/2007  . CHEST PAIN 01/31/2010  . CHRONIC OBSTRUCTIVE PULMONARY DISEASE, ACUTE EXACERBATION 02/18/2009  . COPD 03/02/2007  . CORONARY ARTERY DISEASE 03/02/2007  . DEPRESSION 09/23/2007    anxiety  . DISEASE, PERIPHERAL VASCULAR NEC 03/02/2007  . DIVERTICULOSIS, COLON 09/23/2007  . FATIGUE 01/22/2009  . GERD 03/02/2007  . GLUCOSE INTOLERANCE 09/23/2007  . HEMORRHOIDS 10/09/2007  . HIATAL HERNIA 10/09/2007  . HYPERCHOLESTEROLEMIA 10/09/2007  . HYPERTENSION 09/23/2007  . HYPOKALEMIA 10/09/2007  . MESENTERIC VASCULAR INSUFFICIENCY 07/06/2008  . MITRAL REGURGITATION 11/27/2008  . MI 10/09/2007  . Other specified forms of hearing loss 08/23/2010  . PERIPHERAL VASCULAR DISEASE 09/23/2007  . PUD 10/09/2007  . RENAL ARTERY STENOSIS 03/02/2007  . TOBACCO ABUSE 04/20/2010  . 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 Unspecified Iron Deficiency Anemia 09/23/2007  . URINARY RETENTION 01/22/2009  . VITAMIN B12 DEFICIENCY 01/03/2010  . WRIST PAIN, RIGHT 06/06/2010  . Impaired glucose tolerance 01/29/2011  . Hearing loss in left ear 01/30/2011  . Blindness of both eyes  01/30/2011  . Hyperlipidemia   . Anemia, iron deficiency   . Atrial flutter   . History of colonoscopy   . HTN (hypertension) 03/21/2011  . Peripheral arterial disease    Past Surgical History  Procedure Laterality Date  . Coronary artery bypass graft  1995    LIMA-LAD, SVG-OM, SVG-D, SVG-PDA  . Renal artery stenting  2004    bilateral  . S/p multiple le vascular bypass      includine fem-pop x 2 LLE  . S/p bilateral cea    . Coronary angioplasty with stent placement  2010    DES x 2 SVG-OM, cutting balloon PTCA SVG-PDA for ISR  . Appendectomy    . S/p lumbar disc surgury      x2  . S/p rle fem bypass feb 2011    . Cardiac catheterization    . Pr vein bypass graft,aorto-fem-pop    . Carotid endarterectomy    . Coronary angioplasty with stent placement  2011    DES to SVG-DIAG, SVG-PDA    reports that he has been smoking Cigarettes.  He has a 60 pack-year smoking history. He has never used smokeless tobacco. He reports that he does not drink alcohol or use illicit drugs. family history includes Cancer in his brother, father, and mother; Heart disease in his father and sister; and Stroke in his sister. No Known Allergies Current Outpatient Prescriptions on File Prior to Visit  Medication Sig Dispense Refill  . amLODipine (NORVASC) 10 MG tablet Take 10 mg by mouth daily.      Marland Kitchen aspirin 81 MG EC tablet Take 81 mg by  mouth daily.        Marland Kitchen atorvastatin (LIPITOR) 40 MG tablet Take 40 mg by mouth daily.      . cilostazol (PLETAL) 100 MG tablet Take 100 mg by mouth 2 (two) times daily.      . clopidogrel (PLAVIX) 75 MG tablet Take 75 mg by mouth daily.      . ferrous sulfate 325 (65 FE) MG tablet Take 325 mg by mouth 3 (three) times daily.        . fexofenadine (ALLEGRA) 180 MG tablet Take 180 mg by mouth daily.      . isosorbide mononitrate (IMDUR) 60 MG 24 hr tablet Take 60 mg by mouth daily.      Marland Kitchen lisinopril (PRINIVIL,ZESTRIL) 10 MG tablet Take 10 mg by mouth daily.      .  metoprolol (LOPRESSOR) 50 MG tablet Take 50 mg by mouth 2 (two) times daily.      . nitroGLYCERIN (NITROSTAT) 0.4 MG SL tablet Place 1 tablet (0.4 mg total) under the tongue every 5 (five) minutes as needed for chest pain.  30 tablet  0   No current facility-administered medications on file prior to visit.   Review of Systems  Constitutional: Negative for unexpected weight change, or unusual diaphoresis  HENT: Negative for tinnitus.   Eyes: Negative for photophobia and visual disturbance.  Respiratory: Negative for choking and stridor.   Gastrointestinal: Negative for vomiting and blood in stool.  Genitourinary: Negative for hematuria and decreased urine volume.  Musculoskeletal: Negative for acute joint swelling Skin: Negative for color change and wound.  Neurological: Negative for tremors and numbness other than noted  Psychiatric/Behavioral: Negative for decreased concentration or  hyperactivity.       Objective:   Physical Exam BP 130/62  Pulse 78  Temp(Src) 98.3 F (36.8 C) (Oral)  Ht 6' (1.829 m)  Wt 170 lb 8 oz (77.338 kg)  BMI 23.12 kg/m2  SpO2 94% VS noted,  Constitutional: Pt appears well-developed and well-nourished.  HENT: Head: NCAT.  Right Ear: External ear normal.  Left Ear: External ear normal.  Eyes: Conjunctivae and EOM are normal. Pupils are equal, round, and reactive to light.  Neck: Normal range of motion. Neck supple. spine nontender Cardiovascular: Normal rate and regular rhythm.   Pulmonary/Chest: Effort normal and breath sounds normal.  Abd:  Soft, non-distended, + BS with mild epigastric tender Neurological: Pt is alert. Not confused , motor with 4+/5 distal LUE Skin: Skin is warm. No erythema.  Psychiatric: Pt behavior is normal. Thought content normal. 1-2+nervous    Assessment & Plan:

## 2012-12-20 NOTE — Assessment & Plan Note (Signed)
Ok for zoloft 50 qd,  to f/u any worsening symptoms or concerns 

## 2012-12-20 NOTE — Assessment & Plan Note (Signed)
Exam benign, most likely c/w underlying flare of c-spine djd/ddd - fro tramadol prn, to f/u any worsening symptoms or concerns

## 2012-12-20 NOTE — Patient Instructions (Signed)
Ok to stop the pantoprazole Please take all new medication as prescribed - the Nexium 40 mg per day, tramadol as needed for pain, and generic zoloft 50 mg per day for nerves You will be contacted regarding the referral for: GI Please continue all other medications as before, and refills have been done if requested. Please have the pharmacy call with any other refills you may need. Please return in 3 months, or sooner if needed

## 2012-12-23 ENCOUNTER — Observation Stay (HOSPITAL_COMMUNITY)
Admission: EM | Admit: 2012-12-23 | Discharge: 2012-12-25 | Disposition: A | Discharge status: Home / Self Care | Payer: Medicare Other | Attending: Internal Medicine | Admitting: Internal Medicine

## 2012-12-23 ENCOUNTER — Emergency Department (HOSPITAL_COMMUNITY): Payer: Medicare Other

## 2012-12-23 ENCOUNTER — Encounter (HOSPITAL_COMMUNITY): Payer: Self-pay | Admitting: *Deleted

## 2012-12-23 DIAGNOSIS — H9192 Unspecified hearing loss, left ear: Secondary | ICD-10-CM

## 2012-12-23 DIAGNOSIS — H9190 Unspecified hearing loss, unspecified ear: Secondary | ICD-10-CM | POA: Insufficient documentation

## 2012-12-23 DIAGNOSIS — F329 Major depressive disorder, single episode, unspecified: Secondary | ICD-10-CM

## 2012-12-23 DIAGNOSIS — J4489 Other specified chronic obstructive pulmonary disease: Secondary | ICD-10-CM

## 2012-12-23 DIAGNOSIS — G629 Polyneuropathy, unspecified: Secondary | ICD-10-CM

## 2012-12-23 DIAGNOSIS — I080 Rheumatic disorders of both mitral and aortic valves: Secondary | ICD-10-CM

## 2012-12-23 DIAGNOSIS — F3289 Other specified depressive episodes: Secondary | ICD-10-CM

## 2012-12-23 DIAGNOSIS — R5381 Other malaise: Secondary | ICD-10-CM

## 2012-12-23 DIAGNOSIS — Z79899 Other long term (current) drug therapy: Secondary | ICD-10-CM

## 2012-12-23 DIAGNOSIS — I10 Essential (primary) hypertension: Secondary | ICD-10-CM

## 2012-12-23 DIAGNOSIS — I701 Atherosclerosis of renal artery: Secondary | ICD-10-CM

## 2012-12-23 DIAGNOSIS — F17200 Nicotine dependence, unspecified, uncomplicated: Secondary | ICD-10-CM

## 2012-12-23 DIAGNOSIS — R339 Retention of urine, unspecified: Secondary | ICD-10-CM

## 2012-12-23 DIAGNOSIS — K551 Chronic vascular disorders of intestine: Secondary | ICD-10-CM

## 2012-12-23 DIAGNOSIS — I2510 Atherosclerotic heart disease of native coronary artery without angina pectoris: Secondary | ICD-10-CM

## 2012-12-23 DIAGNOSIS — K449 Diaphragmatic hernia without obstruction or gangrene: Secondary | ICD-10-CM

## 2012-12-23 DIAGNOSIS — Z951 Presence of aortocoronary bypass graft: Secondary | ICD-10-CM

## 2012-12-23 DIAGNOSIS — E538 Deficiency of other specified B group vitamins: Secondary | ICD-10-CM

## 2012-12-23 DIAGNOSIS — K279 Peptic ulcer, site unspecified, unspecified as acute or chronic, without hemorrhage or perforation: Secondary | ICD-10-CM

## 2012-12-23 DIAGNOSIS — J309 Allergic rhinitis, unspecified: Secondary | ICD-10-CM

## 2012-12-23 DIAGNOSIS — E7800 Pure hypercholesterolemia, unspecified: Secondary | ICD-10-CM

## 2012-12-23 DIAGNOSIS — Z860100 Personal history of colon polyps, unspecified: Secondary | ICD-10-CM

## 2012-12-23 DIAGNOSIS — Z8679 Personal history of other diseases of the circulatory system: Secondary | ICD-10-CM

## 2012-12-23 DIAGNOSIS — F411 Generalized anxiety disorder: Secondary | ICD-10-CM

## 2012-12-23 DIAGNOSIS — I219 Acute myocardial infarction, unspecified: Secondary | ICD-10-CM

## 2012-12-23 DIAGNOSIS — H918X9 Other specified hearing loss, unspecified ear: Secondary | ICD-10-CM

## 2012-12-23 DIAGNOSIS — I7389 Other specified peripheral vascular diseases: Secondary | ICD-10-CM

## 2012-12-23 DIAGNOSIS — Z0000 Encounter for general adult medical examination without abnormal findings: Secondary | ICD-10-CM

## 2012-12-23 DIAGNOSIS — E876 Hypokalemia: Secondary | ICD-10-CM

## 2012-12-23 DIAGNOSIS — R5383 Other fatigue: Secondary | ICD-10-CM

## 2012-12-23 DIAGNOSIS — N320 Bladder-neck obstruction: Secondary | ICD-10-CM

## 2012-12-23 DIAGNOSIS — R1013 Epigastric pain: Secondary | ICD-10-CM

## 2012-12-23 DIAGNOSIS — I739 Peripheral vascular disease, unspecified: Secondary | ICD-10-CM

## 2012-12-23 DIAGNOSIS — R7302 Impaired glucose tolerance (oral): Secondary | ICD-10-CM

## 2012-12-23 DIAGNOSIS — K5730 Diverticulosis of large intestine without perforation or abscess without bleeding: Secondary | ICD-10-CM

## 2012-12-23 DIAGNOSIS — Z48812 Encounter for surgical aftercare following surgery on the circulatory system: Secondary | ICD-10-CM

## 2012-12-23 DIAGNOSIS — D509 Iron deficiency anemia, unspecified: Secondary | ICD-10-CM

## 2012-12-23 DIAGNOSIS — M542 Cervicalgia: Secondary | ICD-10-CM

## 2012-12-23 DIAGNOSIS — H543 Unqualified visual loss, both eyes: Secondary | ICD-10-CM

## 2012-12-23 DIAGNOSIS — K649 Unspecified hemorrhoids: Secondary | ICD-10-CM

## 2012-12-23 DIAGNOSIS — K219 Gastro-esophageal reflux disease without esophagitis: Secondary | ICD-10-CM

## 2012-12-23 DIAGNOSIS — R079 Chest pain, unspecified: Principal | ICD-10-CM

## 2012-12-23 DIAGNOSIS — J449 Chronic obstructive pulmonary disease, unspecified: Secondary | ICD-10-CM | POA: Insufficient documentation

## 2012-12-23 LAB — CBC
Platelets: 230 10*3/uL (ref 150–400)
RBC: 4.36 MIL/uL (ref 4.22–5.81)
RDW: 14.9 % (ref 11.5–15.5)
WBC: 8 10*3/uL (ref 4.0–10.5)

## 2012-12-23 LAB — LIPASE, BLOOD: Lipase: 22 U/L (ref 11–59)

## 2012-12-23 LAB — POCT I-STAT TROPONIN I: Troponin i, poc: 0 ng/mL (ref 0.00–0.08)

## 2012-12-23 LAB — BASIC METABOLIC PANEL
CO2: 22 mEq/L (ref 19–32)
Chloride: 99 mEq/L (ref 96–112)
GFR calc Af Amer: >90 mL/min (ref >90)
Potassium: 3.6 mEq/L (ref 3.5–5.1)
Sodium: 134 mEq/L — ABNORMAL LOW (ref 135–145)

## 2012-12-23 LAB — PRO B NATRIURETIC PEPTIDE: Pro B Natriuretic peptide (BNP): 1188 pg/mL — ABNORMAL HIGH (ref 0–450)

## 2012-12-23 LAB — CG4 I-STAT (LACTIC ACID): Lactic Acid, Venous: 2.55 mmol/L — ABNORMAL HIGH (ref 0.5–2.2)

## 2012-12-23 NOTE — ED Provider Notes (Signed)
History     CSN: [id number]  Arrival date & time 12/23/12  2029   First MD Initiated Contact with Patient 12/23/12 2031      Chief Complaint  Patient presents with  . Chest Pain    (Consider location/radiation/quality/duration/timing/severity/associated sxs/prior treatment) Patient is a 78 y.o. male presenting with chest pain.  Chest Pain Pain location:  Substernal area (epigastric) Pain quality: aching and burning   Pain radiates to:  Does not radiate Pain radiates to the back: no   Pain severity:  Severe Onset quality:  Gradual Duration:  1 hour Timing:  Constant Progression:  Resolved Chronicity:  Recurrent Context: at rest   Relieved by:  Nitroglycerin Associated symptoms: abdominal pain, heartburn and nausea   Associated symptoms: no back pain, no cough, no diaphoresis, no fever, no headache, no near-syncope, no numbness, no shortness of breath, not vomiting and no weakness   Risk factors: coronary artery disease, high cholesterol, hypertension, male sex and smoking   Risk factors: no aortic disease, no diabetes mellitus and no prior DVT/PE     Past Medical History  Diagnosis Date  . CEREBROVASCULAR ACCIDENT, HX OF 03/02/2007  . CHEST PAIN 01/31/2010  . CHRONIC OBSTRUCTIVE PULMONARY DISEASE, ACUTE EXACERBATION 02/18/2009  . COPD 03/02/2007  . CORONARY ARTERY DISEASE 03/02/2007  . DEPRESSION 09/23/2007    anxiety  . DISEASE, PERIPHERAL VASCULAR NEC 03/02/2007  . DIVERTICULOSIS, COLON 09/23/2007  . FATIGUE 01/22/2009  . GERD 03/02/2007  . GLUCOSE INTOLERANCE 09/23/2007  . HEMORRHOIDS 10/09/2007  . HIATAL HERNIA 10/09/2007  . HYPERCHOLESTEROLEMIA 10/09/2007  . HYPERTENSION 09/23/2007  . HYPOKALEMIA 10/09/2007  . MESENTERIC VASCULAR INSUFFICIENCY 07/06/2008  . MITRAL REGURGITATION 11/27/2008  . MI 10/09/2007  . Other specified forms of hearing loss 08/23/2010  . PERIPHERAL VASCULAR DISEASE 09/23/2007  . PUD 10/09/2007  . RENAL ARTERY STENOSIS 03/02/2007  . TOBACCO ABUSE 04/20/2010  .  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 Unspecified Iron Deficiency Anemia 09/23/2007  . URINARY RETENTION 01/22/2009  . VITAMIN B12 DEFICIENCY 01/03/2010  . WRIST PAIN, RIGHT 06/06/2010  . Impaired glucose tolerance 01/29/2011  . Hearing loss in left ear 01/30/2011  . Blindness of both eyes 01/30/2011  . Hyperlipidemia   . Anemia, iron deficiency   . Atrial flutter   . History of colonoscopy   . HTN (hypertension) 03/21/2011  . Peripheral arterial disease     Past Surgical History  Procedure Laterality Date  . Coronary artery bypass graft  1995    LIMA-LAD, SVG-OM, SVG-D, SVG-PDA  . Renal artery stenting  2004    bilateral  . S/p multiple le vascular bypass      includine fem-pop x 2 LLE  . S/p bilateral cea    . Coronary angioplasty with stent placement  2010    DES x 2 SVG-OM, cutting balloon PTCA SVG-PDA for ISR  . Appendectomy    . S/p lumbar disc surgury      x2  . S/p rle fem bypass feb 2011    . Cardiac catheterization    . Pr vein bypass graft,aorto-fem-pop    . Carotid endarterectomy    . Coronary angioplasty with stent placement  2011    DES to SVG-DIAG, SVG-PDA    Family History  Problem Relation Age of Onset  . Cancer Mother     colon cancer at 51yo  . Heart disease Father   . Cancer Father   . Heart disease Sister   . Cancer Brother  prostate cancer  . Stroke Sister     History  Substance Use Topics  . Smoking status: Current Every Day Smoker -- 1.00 packs/day for 60 years    Types: Cigarettes  . Smokeless tobacco: Never Used     Comment: 1 ppd  . Alcohol Use: No      Review of Systems  Constitutional: Negative for fever, chills and diaphoresis.  HENT: Negative for congestion, rhinorrhea, neck pain and neck stiffness.   Eyes: Negative for visual disturbance.  Respiratory: Negative for cough and shortness of breath.   Cardiovascular: Positive for chest pain. Negative for leg swelling and near-syncope.  Gastrointestinal: Positive for  heartburn, nausea and abdominal pain. Negative for vomiting and diarrhea.  Genitourinary: Negative for dysuria, urgency, frequency, flank pain and difficulty urinating.  Musculoskeletal: Negative for back pain.  Skin: Negative for rash.  Neurological: Negative for syncope, weakness, numbness and headaches.  All other systems reviewed and are negative.    Allergies  Review of patient's allergies indicates no known allergies.  Home Medications   Current Outpatient Rx  Name  Route  Sig  Dispense  Refill  . amLODipine (NORVASC) 10 MG tablet   Oral   Take 10 mg by mouth daily.         Marland Kitchen aspirin 81 MG EC tablet   Oral   Take 81 mg by mouth daily.           Marland Kitchen atorvastatin (LIPITOR) 40 MG tablet   Oral   Take 40 mg by mouth daily.         . cilostazol (PLETAL) 100 MG tablet   Oral   Take 100 mg by mouth 2 (two) times daily.         . clopidogrel (PLAVIX) 75 MG tablet   Oral   Take 75 mg by mouth daily.         Marland Kitchen esomeprazole (NEXIUM) 40 MG capsule   Oral   Take 1 capsule (40 mg total) by mouth daily.   90 capsule   3   . ferrous sulfate 325 (65 FE) MG tablet   Oral   Take 325 mg by mouth 3 (three) times daily.           . fexofenadine (ALLEGRA) 180 MG tablet   Oral   Take 180 mg by mouth daily.         . isosorbide mononitrate (IMDUR) 60 MG 24 hr tablet   Oral   Take 60 mg by mouth daily.         Marland Kitchen lisinopril (PRINIVIL,ZESTRIL) 10 MG tablet   Oral   Take 10 mg by mouth daily.         . metoprolol (LOPRESSOR) 50 MG tablet   Oral   Take 50 mg by mouth 2 (two) times daily.         . nitroGLYCERIN (NITROSTAT) 0.4 MG SL tablet   Sublingual   Place 1 tablet (0.4 mg total) under the tongue every 5 (five) minutes as needed for chest pain.   30 tablet   0   . sertraline (ZOLOFT) 50 MG tablet   Oral   Take 1 tablet (50 mg total) by mouth daily.   90 tablet   3   . traMADol (ULTRAM) 50 MG tablet   Oral   Take 1 tablet (50 mg total) by mouth  every 8 (eight) hours as needed for pain.   60 tablet   0     BP  154/62  Pulse 90  Resp 26  SpO2 94%  Physical Exam  Nursing note and vitals reviewed. Constitutional: He is oriented to person, place, and time. He appears well-developed and well-nourished. No distress.  HENT:  Head: Normocephalic and atraumatic.  Mouth/Throat: Oropharynx is clear and moist.  Eyes: Conjunctivae and EOM are normal. Pupils are equal, round, and reactive to light. No scleral icterus.  Neck: Normal range of motion. Neck supple. No JVD present.  Cardiovascular: Normal rate, regular rhythm, normal heart sounds and intact distal pulses.  Exam reveals no gallop and no friction rub.   No murmur heard. Pulmonary/Chest: Effort normal and breath sounds normal. No respiratory distress. He has no wheezes. He has no rales.  Abdominal: Soft. Bowel sounds are normal. He exhibits no distension. There is no tenderness. There is no rebound and no guarding.  Musculoskeletal: He exhibits no edema.  Neurological: He is alert and oriented to person, place, and time. No cranial nerve deficit. He exhibits normal muscle tone. Coordination normal.  Skin: Skin is warm and dry. He is not diaphoretic.    ED Course  Procedures (including critical care time)  Labs Reviewed  PRO B NATRIURETIC PEPTIDE - Abnormal; Notable for the following:    Pro B Natriuretic peptide (BNP) 1188.0 (*)    All other components within normal limits  CBC - Abnormal; Notable for the following:    HCT 38.0 (*)    All other components within normal limits  BASIC METABOLIC PANEL - Abnormal; Notable for the following:    Sodium 134 (*)    Glucose, Bld 143 (*)    GFR calc non Af Amer 80 (*)    All other components within normal limits  CG4 I-STAT (LACTIC ACID) - Abnormal; Notable for the following:    Lactic Acid, Venous 2.55 (*)    All other components within normal limits  LIPASE, BLOOD  POCT I-STAT TROPONIN I   Dg Chest Port 1 View  12/23/2012   *RADIOLOGY REPORT*  Clinical Data: Chest pain, shortness of breath  PORTABLE CHEST - 1 VIEW  Comparison: 12/16/2012 CT  Findings: Aortic prominence and tortuosity with advanced atherosclerosis.  Status post median sternotomy and CABG.  Linear opacities periphery right mid and upper lung, correspond to scarring on recent CT.  Mild left lung base opacities; atelectasis versus infiltrate.  Heart size upper normal to mildly enlarged. Diffuse osteopenia.  IMPRESSION: Mild left lung base opacity; atelectasis versus infiltrate.  Chronic changes are similar to prior.   Original Report Authenticated By: Jearld Lesch, M.D.      Date: 12/23/2012  Rate: 89  Rhythm: normal sinus rhythm  QRS Axis: normal  Intervals: PR prolonged  ST/T Wave abnormalities: ST depressions inferiorly and ST depressions laterally  Conduction Disutrbances:first-degree A-V block   Narrative Interpretation:   Old EKG Reviewed: unchanged    1. Chest pain       MDM  78 yo M with end stage CAD, s/p NSTEMI 5/4, cath revealed extensive disease, all native vessels occluded, some bypass grafts occluded, no lesion amenable to cath, maximally medically managed. Here with CP.  Onset 1 hour PTA.  8/10.  Similar to prior MI.  Nausea.   Hypertensive to 180s stystolic; vitals otherwise normal.  Appears chronically ill but in NAD.  Heart murmur.  Mild rales on auscultation at bases.  No edema.  He is given ASA 324  Doubt PE or dissection.  Likely from his severe CAD vs gastrointestinal; it is somewhat atypical.  EKG with ST depressions in inferior and lateral leads, similar to prior, normal sinus rhythm Troponin negative X 1 CXR with LLL atelectasis vs infiltrate; clinically doubt PNA BNP > 1100; lactate 2.55  Cardiology consulted; they feel unlikely ACS given atypical nature, but given risk factors, advised admission for tele and serial enzymes, observation; admitted to medicine; did not anticoagulate with heparin given  atypical nature   Toney Sang, MD 12/24/12 0250  I performed a history and physical examination of  [name] and discussed his management with Dr. Peyton Najjar. I agree with the history, physical, assessment, and plan of care, with the following exceptions: None I was present for the following procedures: None  Time Spent in Critical Care of the patient: None  Time spent in discussions with the patient and family: 10 minutes  Atypical chest pain, with ACS risk factors - and reportedly per patient, pain that is similar to his previous MI. Cards consulted, and dispo based on the ir recs.. EKG shows no acute changes.  Derwood Kaplan   Derwood Kaplan, MD 12/30/12 [phone]

## 2012-12-23 NOTE — ED Notes (Addendum)
Her by [facility], from home, for CP, onset 1 hr PTA, took his own ntg at home before EMS with improvement, EMS gave 1 ntg and ASA 324mg , pt declined IV, arrives painfree on RA, NSR on monitor, recent MI on 5/4, 100% blockage, non-surgical, treated medically, pt of Dr. Clifton James ([place] card), wife present, alert, NAD, calm, interactive, occaisional grunting from pain, states, "feels like gas, I know it is", Dr. Peyton Najjar EDP into room, present on arrival.

## 2012-12-23 NOTE — ED Notes (Addendum)
Pt/family updated, family at [facility] x3, pt resting comfortably, alert, NAD, calm, sleeping/easily arousable to voice, lying on L side, HOB 20 degrees, no dyspnea noted, pending cardiology consult.

## 2012-12-24 ENCOUNTER — Encounter (HOSPITAL_COMMUNITY): Payer: Self-pay | Admitting: Internal Medicine

## 2012-12-24 DIAGNOSIS — Z951 Presence of aortocoronary bypass graft: Secondary | ICD-10-CM

## 2012-12-24 DIAGNOSIS — I10 Essential (primary) hypertension: Secondary | ICD-10-CM

## 2012-12-24 DIAGNOSIS — I739 Peripheral vascular disease, unspecified: Secondary | ICD-10-CM

## 2012-12-24 DIAGNOSIS — R079 Chest pain, unspecified: Secondary | ICD-10-CM

## 2012-12-24 DIAGNOSIS — I2510 Atherosclerotic heart disease of native coronary artery without angina pectoris: Secondary | ICD-10-CM

## 2012-12-24 LAB — CBC
HCT: 37.5 % — ABNORMAL LOW (ref 39.0–52.0)
MCH: 29.8 pg (ref 26.0–34.0)
MCV: 87.2 fL (ref 78.0–100.0)
Platelets: 204 10*3/uL (ref 150–400)
RDW: 14.9 % (ref 11.5–15.5)
WBC: 6.7 10*3/uL (ref 4.0–10.5)

## 2012-12-24 LAB — BASIC METABOLIC PANEL
CO2: 29 mEq/L (ref 19–32)
Calcium: 9.1 mg/dL (ref 8.4–10.5)
Chloride: 102 mEq/L (ref 96–112)
Creatinine, Ser: 0.85 mg/dL (ref 0.50–1.35)
Glucose, Bld: 94 mg/dL (ref 70–99)

## 2012-12-24 LAB — TROPONIN I: Troponin I: <0.3 ng/mL (ref <0.30)

## 2012-12-24 MED ORDER — ASPIRIN EC 325 MG PO TBEC
325.0000 mg | DELAYED_RELEASE_TABLET | Freq: Every day | ORAL | Status: DC
  Administered 2012-12-24 – 2012-12-25 (×2): 325 mg via ORAL
  Filled 2012-12-24: qty 1

## 2012-12-24 MED ORDER — ISOSORBIDE MONONITRATE ER 60 MG PO TB24
60.0000 mg | ORAL_TABLET | Freq: Every day | ORAL | Status: DC
  Administered 2012-12-24 – 2012-12-25 (×2): 60 mg via ORAL
  Filled 2012-12-24 (×2): qty 1

## 2012-12-24 MED ORDER — ACETAMINOPHEN 650 MG RE SUPP: 650.0000 mg | Freq: Four times a day (QID) | RECTAL | Status: DC | PRN

## 2012-12-24 MED ORDER — SERTRALINE HCL 50 MG PO TABS
50.0000 mg | ORAL_TABLET | Freq: Every day | ORAL | Status: DC
  Administered 2012-12-24 – 2012-12-25 (×2): 50 mg via ORAL
  Filled 2012-12-24 (×2): qty 1

## 2012-12-24 MED ORDER — SODIUM CHLORIDE 0.9 % IJ SOLN
3.0000 mL | Freq: Two times a day (BID) | INTRAMUSCULAR | Status: DC
  Administered 2012-12-24: 3 mL via INTRAVENOUS

## 2012-12-24 MED ORDER — NITROGLYCERIN 0.4 MG SL SUBL: 0.4000 mg | SUBLINGUAL_TABLET | SUBLINGUAL | Status: DC | PRN

## 2012-12-24 MED ORDER — AMLODIPINE BESYLATE 10 MG PO TABS
10.0000 mg | ORAL_TABLET | Freq: Every day | ORAL | Status: DC
  Administered 2012-12-24 – 2012-12-25 (×2): 10 mg via ORAL
  Filled 2012-12-24 (×2): qty 1

## 2012-12-24 MED ORDER — ACETAMINOPHEN 325 MG PO TABS: 650.0000 mg | ORAL_TABLET | Freq: Four times a day (QID) | ORAL | Status: DC | PRN

## 2012-12-24 MED ORDER — ONDANSETRON HCL 4 MG/2ML IJ SOLN: 4.0000 mg | Freq: Four times a day (QID) | INTRAMUSCULAR | Status: DC | PRN

## 2012-12-24 MED ORDER — CILOSTAZOL 100 MG PO TABS
100.0000 mg | ORAL_TABLET | Freq: Two times a day (BID) | ORAL | Status: DC
  Administered 2012-12-24 – 2012-12-25 (×3): 100 mg via ORAL
  Filled 2012-12-24 (×4): qty 1

## 2012-12-24 MED ORDER — PANTOPRAZOLE SODIUM 40 MG PO TBEC
40.0000 mg | DELAYED_RELEASE_TABLET | Freq: Two times a day (BID) | ORAL | Status: DC
  Administered 2012-12-24 – 2012-12-25 (×2): 40 mg via ORAL

## 2012-12-24 MED ORDER — ATORVASTATIN CALCIUM 40 MG PO TABS
40.0000 mg | ORAL_TABLET | Freq: Every day | ORAL | Status: DC
  Administered 2012-12-24 – 2012-12-25 (×2): 40 mg via ORAL
  Filled 2012-12-24 (×2): qty 1

## 2012-12-24 MED ORDER — TRAMADOL HCL 50 MG PO TABS: 50.0000 mg | ORAL_TABLET | Freq: Three times a day (TID) | ORAL | Status: DC | PRN

## 2012-12-24 MED ORDER — CLOPIDOGREL BISULFATE 75 MG PO TABS
75.0000 mg | ORAL_TABLET | Freq: Every day | ORAL | Status: DC
  Administered 2012-12-24 – 2012-12-25 (×2): 75 mg via ORAL
  Filled 2012-12-24: qty 1

## 2012-12-24 MED ORDER — LORATADINE 10 MG PO TABS
10.0000 mg | ORAL_TABLET | Freq: Every day | ORAL | Status: DC
  Administered 2012-12-24 – 2012-12-25 (×2): 10 mg via ORAL
  Filled 2012-12-24 (×2): qty 1

## 2012-12-24 MED ORDER — FERROUS SULFATE 325 (65 FE) MG PO TABS
325.0000 mg | ORAL_TABLET | Freq: Three times a day (TID) | ORAL | Status: DC
  Administered 2012-12-24 – 2012-12-25 (×3): 325 mg via ORAL
  Filled 2012-12-24 (×6): qty 1

## 2012-12-24 MED ORDER — PANTOPRAZOLE SODIUM 40 MG PO TBEC: 40.0000 mg | DELAYED_RELEASE_TABLET | Freq: Every day | ORAL | Status: DC

## 2012-12-24 MED ORDER — ENOXAPARIN SODIUM 40 MG/0.4ML ~~LOC~~ SOLN
40.0000 mg | SUBCUTANEOUS | Status: DC
  Administered 2012-12-24: 40 mg via SUBCUTANEOUS
  Filled 2012-12-24 (×2): qty 0.4

## 2012-12-24 MED ORDER — METOPROLOL TARTRATE 50 MG PO TABS
75.0000 mg | ORAL_TABLET | Freq: Two times a day (BID) | ORAL | Status: DC
  Administered 2012-12-24 – 2012-12-25 (×3): 75 mg via ORAL
  Filled 2012-12-24 (×4): qty 1

## 2012-12-24 MED ORDER — ONDANSETRON HCL 4 MG PO TABS: 4.0000 mg | ORAL_TABLET | Freq: Four times a day (QID) | ORAL | Status: DC | PRN

## 2012-12-24 MED ORDER — LISINOPRIL 10 MG PO TABS
10.0000 mg | ORAL_TABLET | Freq: Every day | ORAL | Status: DC
  Administered 2012-12-24 – 2012-12-25 (×2): 10 mg via ORAL
  Filled 2012-12-24 (×2): qty 1

## 2012-12-24 MED ORDER — METOPROLOL TARTRATE 50 MG PO TABS
50.0000 mg | ORAL_TABLET | Freq: Two times a day (BID) | ORAL | Status: DC
  Filled 2012-12-24 (×2): qty 1

## 2012-12-24 NOTE — ED Notes (Signed)
Admitting MD in to see pt. Wife at [facility]. No changes, alert, NAD, calm.

## 2012-12-24 NOTE — Consult Note (Signed)
Cardiology IP Consult  Reason for Consult: chest pain Referring Physician: Rhunette Croft  HPI: [name] is a 78 y.o.male with CAD and prior CABG who is here with chest and abdominal pain.  He was recently admitted to [facility] with epigastric and subziphoid pain that wakes him at night.  On admission his O2 sats were in the mid 80s.  He was found to have a mld troponin elevation and went for [facility] on 12/15/2012.  His native vessels were occluded, SVG-RCA occluded, SVG-OM was patent with a subtotal occlusion of the anastomosis, SVG-D1 was patent, LIMA was not visualized.  LVEF was preserved by LV gram.  He was treated medically.  Tonight he was eating a bowl of cereal and developed epigastric and chest pain.  His family was concerned and brought him to the ED.  His pain was  relieved after belching.  He is currently pain free.  He was recently started on nexium and he thinks this has probably helped some of his symptoms.     Past Medical History  Diagnosis Date  . CEREBROVASCULAR ACCIDENT, HX OF 03/02/2007  . CHEST PAIN 01/31/2010  . CHRONIC OBSTRUCTIVE PULMONARY DISEASE, ACUTE EXACERBATION 02/18/2009  . COPD 03/02/2007  . CORONARY ARTERY DISEASE 03/02/2007  . DEPRESSION 09/23/2007    anxiety  . DISEASE, PERIPHERAL VASCULAR NEC 03/02/2007  . DIVERTICULOSIS, COLON 09/23/2007  . FATIGUE 01/22/2009  . GERD 03/02/2007  . GLUCOSE INTOLERANCE 09/23/2007  . HEMORRHOIDS 10/09/2007  . HIATAL HERNIA 10/09/2007  . HYPERCHOLESTEROLEMIA 10/09/2007  . HYPERTENSION 09/23/2007  . HYPOKALEMIA 10/09/2007  . MESENTERIC VASCULAR INSUFFICIENCY 07/06/2008  . MITRAL REGURGITATION 11/27/2008  . MI 10/09/2007  . Other specified forms of hearing loss 08/23/2010  . PERIPHERAL VASCULAR DISEASE 09/23/2007  . PUD 10/09/2007  . RENAL ARTERY STENOSIS 03/02/2007  . TOBACCO ABUSE 04/20/2010  . 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 Unspecified Iron Deficiency Anemia 09/23/2007  . URINARY RETENTION 01/22/2009  . VITAMIN B12 DEFICIENCY  01/03/2010  . WRIST PAIN, RIGHT 06/06/2010  . Impaired glucose tolerance 01/29/2011  . Hearing loss in left ear 01/30/2011  . Blindness of both eyes 01/30/2011  . Hyperlipidemia   . Anemia, iron deficiency   . Atrial flutter   . History of colonoscopy   . HTN (hypertension) 03/21/2011  . Peripheral arterial disease     Past Surgical History  Procedure Laterality Date  . Coronary artery bypass graft  1995    LIMA-LAD, SVG-OM, SVG-D, SVG-PDA  . Renal artery stenting  2004    bilateral  . S/p multiple le vascular bypass      includine fem-pop x 2 LLE  . S/p bilateral cea    . Coronary angioplasty with stent placement  2010    DES x 2 SVG-OM, cutting balloon PTCA SVG-PDA for ISR  . Appendectomy    . S/p lumbar disc surgury      x2  . S/p rle fem bypass feb 2011    . Cardiac catheterization    . Pr vein bypass graft,aorto-fem-pop    . Carotid endarterectomy    . Coronary angioplasty with stent placement  2011    DES to SVG-DIAG, SVG-PDA    Family History  Problem Relation Age of Onset  . Cancer Mother     colon cancer at 56yo  . Heart disease Father   . Cancer Father   . Heart disease Sister   . Cancer Brother     prostate cancer  . Stroke Sister  Social History:  reports that he has been smoking Cigarettes.  He has a 60 pack-year smoking history. He has never used smokeless tobacco. He reports that he does not drink alcohol or use illicit drugs.  Allergies: No Known Allergies  No current facility-administered medications for this encounter.   Current Outpatient Prescriptions  Medication Sig Dispense Refill  . amLODipine (NORVASC) 10 MG tablet Take 10 mg by mouth daily.      Marland Kitchen aspirin 81 MG EC tablet Take 81 mg by mouth daily.        Marland Kitchen atorvastatin (LIPITOR) 40 MG tablet Take 40 mg by mouth daily.      . cilostazol (PLETAL) 100 MG tablet Take 100 mg by mouth 2 (two) times daily.      . clopidogrel (PLAVIX) 75 MG tablet Take 75 mg by mouth daily.      Marland Kitchen esomeprazole  (NEXIUM) 40 MG capsule Take 1 capsule (40 mg total) by mouth daily.  90 capsule  3  . ferrous sulfate 325 (65 FE) MG tablet Take 325 mg by mouth 3 (three) times daily.        . fexofenadine (ALLEGRA) 180 MG tablet Take 180 mg by mouth daily.      . isosorbide mononitrate (IMDUR) 60 MG 24 hr tablet Take 60 mg by mouth daily.      Marland Kitchen lisinopril (PRINIVIL,ZESTRIL) 10 MG tablet Take 10 mg by mouth daily.      . metoprolol (LOPRESSOR) 50 MG tablet Take 50 mg by mouth 2 (two) times daily.      . nitroGLYCERIN (NITROSTAT) 0.4 MG SL tablet Place 1 tablet (0.4 mg total) under the tongue every 5 (five) minutes as needed for chest pain.  30 tablet  0  . sertraline (ZOLOFT) 50 MG tablet Take 1 tablet (50 mg total) by mouth daily.  90 tablet  3  . traMADol (ULTRAM) 50 MG tablet Take 1 tablet (50 mg total) by mouth every 8 (eight) hours as needed for pain.  60 tablet  0    ROS: A full review of systems is obtained and is negative except as noted in the HPI.  Physical Exam: Blood pressure 154/62, pulse 90, resp. rate 26, SpO2 94.00%.  GENERAL: no acute distress.  EYES: Extra ocular movements are intact. There is no lid lag. Sclera is anicteric.  ENT: Oropharynx is clear. Dentition is within normal limits.  NECK: Supple. The thyroid is not enlarged.  LYMPH: There are no masses or lymphadenopathy present.  HEART: Regular rate and rhythm with 2/6 holosystolic murmur at apex LUNGS: Clear to auscultation with mld scattered wheezes  ABDOMEN: Soft, non-tender, and non-distended with normoactive bowel sounds. There is no hepatosplenomegaly.  EXTREMITIES: No clubbing, cyanosis, or edema.  PULSES: Femoral pulses were +2 and equal bilaterally. DP/PT pulses were +2 and equal bilaterally.  SKIN: Warm, dry, and intact.  NEUROLOGIC: The patient was oriented to person, place, and time. No overt neurologic deficits were detected.  PSYCH: Normal judgment and insight, mood is appropriate.    Results: Results for orders  placed during the hospital encounter of 12/23/12 (from the past 24 hour(s))  PRO B NATRIURETIC PEPTIDE     Status: Abnormal   Collection Time    12/23/12  8:45 PM      Result Value Range   Pro B Natriuretic peptide (BNP) 1188.0 (*) 0 - 450 pg/mL  CBC     Status: Abnormal   Collection Time    12/23/12  8:45 PM  Result Value Range   WBC 8.0  4.0 - 10.5 K/uL   RBC 4.36  4.22 - 5.81 MIL/uL   Hemoglobin 13.2  13.0 - 17.0 g/dL   HCT 16.1 (*) 09.6 - 04.5 %   MCV 87.2  78.0 - 100.0 fL   MCH 30.3  26.0 - 34.0 pg   MCHC 34.7  30.0 - 36.0 g/dL   RDW 40.9  81.1 - 91.4 %   Platelets 230  150 - 400 K/uL  BASIC METABOLIC PANEL     Status: Abnormal   Collection Time    12/23/12  8:45 PM      Result Value Range   Sodium 134 (*) 135 - 145 mEq/L   Potassium 3.6  3.5 - 5.1 mEq/L   Chloride 99  96 - 112 mEq/L   CO2 22  19 - 32 mEq/L   Glucose, Bld 143 (*) 70 - 99 mg/dL   BUN 10  6 - 23 mg/dL   Creatinine, Ser 7.82  0.50 - 1.35 mg/dL   Calcium 9.4  8.4 - 95.6 mg/dL   GFR calc non Af Amer 80 (*) >90 mL/min   GFR calc Af Amer >90  >90 mL/min  LIPASE, BLOOD     Status: None   Collection Time    12/23/12  8:45 PM      Result Value Range   Lipase 22  11 - 59 U/L  POCT I-STAT TROPONIN I     Status: None   Collection Time    12/23/12  8:58 PM      Result Value Range   Troponin i, poc 0.00  0.00 - 0.08 ng/mL   Comment 3           CG4 I-STAT (LACTIC ACID)     Status: Abnormal   Collection Time    12/23/12 11:25 PM      Result Value Range   Lactic Acid, Venous 2.55 (*) 0.5 - 2.2 mmol/L    CXR: atelectasis EKG: NSR without inferolateral ST changes unchanged from prior  Assessment/Plan: 78 yo with CAD and prior CABG with recent LHC as above here with epigastric and chest discomfort that appears to be more consistent with GERD/dyspepsia 1. CAD: overall stable.  Symptoms appear to be noncardiac and related to GI issues - continue low dose ASA - statin and BB - follow serial cardiac  enzymes, if negative no further cardiac workup is needed 2. Dyspepsia/GERD:  - nexium - GI evaluation pending Call with questions   [name] 12/24/2012, 1:54 AM

## 2012-12-24 NOTE — Progress Notes (Signed)
TELEMETRY: Reviewed telemetry pt in NSR with rare PVC: Filed Vitals:   12/24/12 0330 12/24/12 0400 12/24/12 0437 12/24/12 0455  BP: 157/63 121/48  172/79  Pulse: 78 86  87  Temp:   98.6 F (37 C) 97.8 F (36.6 C)  TempSrc:   Oral Oral  Resp:    18  Height:    6' (1.829 m)  Weight:    170 lb 8 oz (77.338 kg)  SpO2: 98% 98%  94%    Intake/Output Summary (Last 24 hours) at 12/24/12 0817 Last data filed at 12/24/12 0105  Gross per 24 hour  Intake      0 ml  Output   1000 ml  Net  -1000 ml    SUBJECTIVE Feels much better this am. Denies chest or epigastric pain now.  LABS: Basic Metabolic Panel:  Recent Labs  13/24/40 2045  NA 134*  K 3.6  CL 99  CO2 22  GLUCOSE 143*  BUN 10  CREATININE 0.93  CALCIUM 9.4    Recent Labs  12/23/12 2045  LIPASE 22   CBC:  Recent Labs  12/23/12 2045  WBC 8.0  HGB 13.2  HCT 38.0*  MCV 87.2  PLT 230    Radiology/Studies:  Ct Angio Chest Pe W/cm &/or Wo Cm  12/16/2012  *RADIOLOGY REPORT*  Clinical Data: 78 year old male with chest pain and pressure.  CT ANGIOGRAPHY CHEST  Technique:  Multidetector CT imaging of the chest using the standard protocol during bolus administration of intravenous contrast. Multiplanar reconstructed images including MIPs were obtained and reviewed to evaluate the vascular anatomy.  Contrast: 80mL OMNIPAQUE IOHEXOL 350 MG/ML SOLN  Comparison: Portable chest radiograph from the same day and earlier. CT abdomen 07/23/2008.  Findings: Good contrast bolus timing in the pulmonary arterial tree.  No focal filling defect identified in the pulmonary arterial tree to suggest the presence of acute pulmonary embolism.  Severe widespread atherosclerosis, mostly calcified plaque. Diffuse involvement of the aorta and great vessels.  Coronary artery involvement.  No mediastinal lymphadenopathy.  Negative visualized thyroid.  Previous median sternotomy. No acute osseous abnormality identified.  Major airways are patent.   Widespread peribronchial thickening. Mild upper lobe bronchiectasis.  Mild to moderate upper lobe predominant emphysema.  In the right hemithorax there is pleural thickening and/or trace pleural effusion.  Some small calcified pleural plaques are noted.  None of the areas of pleural thickening are mass-like.  There is associated subpleural scarring and reticular opacity.  There are more confluent subpleural and streaky areas of pulmonary opacity.  None of these parenchymal foci are mass like. The findings at the right lung base have progressed since 2009.  Superimposed dependent atelectasis in the right lung.  In the left lung there is also dependent atelectasis.  There is mild scarring in the lingula.  Negative visualized right upper quadrant abdominal viscera. Chronic left adrenal mass measures approximately 20 mm and appears not significantly changed since 2009.  This has densitometry of less than 10 HU today (8 HU).  IMPRESSION: 1. No evidence of acute pulmonary embolus. 2.  Chronic lung disease with widespread right lung pleural thickening, progressed at the lung base since the 2009 CT abdomen. Associated scattered subpleural and confluent scarring.   No areas suspicious for acute lung infection.  No focal suspicious or mass-like area identified in the right lung, but imaging surveillance (such as periodic chest radiographs) of this patient's lungs would be prudent. 3. Advanced widespread atherosclerosis. 4.  Chronic benign appearing left  adrenal mass.  Favor adenoma.   Original Report Authenticated By: Erskine Speed, M.D.    Dg Chest Port 1 View  12/23/2012  *RADIOLOGY REPORT*  Clinical Data: Chest pain, shortness of breath  PORTABLE CHEST - 1 VIEW  Comparison: 12/16/2012 CT  Findings: Aortic prominence and tortuosity with advanced atherosclerosis.  Status post median sternotomy and CABG.  Linear opacities periphery right mid and upper lung, correspond to scarring on recent CT.  Mild left lung base opacities;  atelectasis versus infiltrate.  Heart size upper normal to mildly enlarged. Diffuse osteopenia.  IMPRESSION: Mild left lung base opacity; atelectasis versus infiltrate.  Chronic changes are similar to prior.   Original Report Authenticated By: Jearld Lesch, M.D.    Dg Chest Port 1 View  12/16/2012  *RADIOLOGY REPORT*  Clinical Data: 78 year old male with chest and epigastric pain.  PORTABLE CHEST - 1 VIEW  Comparison: 05/14/2012 and earlier.  Findings: AP portable semi upright view 0047 hours.  Stable lung volumes.  Stable cardiac size and mediastinal contours.  Stable sequelae of CABG.  No pneumothorax.  Chronic increased interstitial markings asymmetrically greater on the right again noted.  No pleural effusion.  Mildly increased confluent opacity on the right. Otherwise no acute pulmonary opacity.  IMPRESSION: Chronic lung disease. A small area of mild increased confluence of opacity on the right suspicious for a right lung infectious exacerbation.   Original Report Authenticated By: Erskine Speed, M.D.    Cardiac Cath Note  [name]  [id number]  May 02, 1935  Procedure: left Heart Cardiac Catheterization Note  Indications: CP, + Troponin  Procedure Details  Consent: Obtained  Time Out: Verified patient identification, verified procedure, site/side was marked, verified correct patient position, special equipment/implants available, Radiology Safety Procedures followed, medications/allergies/relevent history reviewed, required imaging and test results available. Performed  Medications:  Fentanyl: 50 mcg IV  Versed:  The right femoral artery was easily canulated using a modified Seldinger technique. We used a whooley wire to access the central circulation  Hemodynamics:  LV pressure: 157/8  Aortic pressure: 157/59  Angiography  Left Main: occluded  Left anterior Descending: occluded  Left Circumflex: occluded  Right Coronary Artery: occluded  SVG to RCA: Occluded proximally  SVG to OM:  large graft. Patent stents in the body of the graft. The anastomosis to the OM is subtotally occluded. There is TIMI II filling of OM from the graft.  SVG to Diag: Patent graft. The mid graft stents are patent. The anastomosis to the diag is normal. There is slight filling of the LAD in a retrograde fashion from the diag  The aortic arch was heavily calcified. The 3 great vessels were very heavily calcified with diffuse irregularities.  LIMA to LAD: We were unable to canulate the left subclavian due to severe calcified disease. The right carotid artery has a tight irregular stenosis at the origin. The left subclavian has moderate disease.  LV Gram: well preserved LV function. EF 50% -55%  Complications: No apparent complications  Patient did tolerate procedure well.  Contrast used: 95 CC  Conclusions:  1. Severe native and graft disease.  2. Patent SVG to OM and SVG to diag. I was not able to visualize the LIMA due to inability to navigate the left subclavian. Aortic root angiogram reveals significant disease in the right carotid and left subclavian. I decided to not try any further attempts to get up into the left subclavian.  3. Well preserved LV function.  Will continue medical therapy.  Vesta Mixer, [name]., MD, [facility]  12/16/2012, 2:18 PM  PHYSICAL EXAM General: Well developed, well nourished, in no acute distress. Head: Normal Neck: Negative for carotid bruits. JVD not elevated. Lungs: Course breath sounds. Heart: RRR S1 S2 with 2/6 systolic murmur at the apex.  Abdomen: Soft, non-tender, non-distended with normoactive bowel sounds. No hepatomegaly. No rebound/guarding. No obvious abdominal masses. Msk:  Strength and tone appears normal for age. Extremities: No clubbing, cyanosis or edema.  Distal pedal pulses are 2+ and equal bilaterally. Neuro: Alert and oriented X 3. Moves all extremities spontaneously. Psych:  Responds to questions appropriately with a normal affect.  ASSESSMENT  AND PLAN: 1. Epigastric pain. Suspect more related to GI source. Initial troponin negative. Awaiting serial enzymes. GI work up pending. 2. CAD s/p CABG. Occluded grafts to RCA and OM. Continue medical therapy with ASA, beta blocker, Norvasc, and nitrates. 3. HTN- BP labile- will increase metoprolol to 75 mg bid.   Active Problems:   CORONARY ARTERY DISEASE   HTN (hypertension)   PVD (peripheral vascular disease) with claudication   Chest pain    Signed, Kendra Grissett [place] MD,FACC 12/24/2012 8:22 AM

## 2012-12-24 NOTE — ED Notes (Addendum)
No change, pending arrival of hospitalist/ admitting MD, no changes.

## 2012-12-24 NOTE — ED Notes (Addendum)
Dr. Charm Barges cardiology at [facility].

## 2012-12-24 NOTE — Progress Notes (Signed)
Utilization review completed.  

## 2012-12-24 NOTE — H&P (Signed)
[facility] History and Physical  [name] ZOX:[id number] DOB: 06-28-1935 DOA: 12/23/2012  Referring physician: ER physician PCP: [name], MD  Specialists: [facility] cardiology.  Chief Complaint: Chest pain.  HPI: [name] is a 78 y.o. male history of CAD status post CABG presents with complaints of chest pain. Patient started feeling chest pain last night while eating. Chest pain was associated with epigastric discomfort. Denies any nausea vomiting diarrhea shortness of breath or diaphoresis. Chest pain lasted for almost an hour was retrosternal pressure-like nonradiating. By the time patient reached ER he was chest pain-free. Cardiologist on call had evaluated the patient and at this time patient has been admitted for observation. Patient has had a cardiac catheter during the recent admission and medical management was advised. Presently patient is chest pain-free and will be admitted for further observation.   Review of Systems: As presented in the history of presenting illness, rest negative.  Past Medical History  Diagnosis Date  . CEREBROVASCULAR ACCIDENT, HX OF 03/02/2007  . CHEST PAIN 01/31/2010  . CHRONIC OBSTRUCTIVE PULMONARY DISEASE, ACUTE EXACERBATION 02/18/2009  . COPD 03/02/2007  . CORONARY ARTERY DISEASE 03/02/2007  . DEPRESSION 09/23/2007    anxiety  . DISEASE, PERIPHERAL VASCULAR NEC 03/02/2007  . DIVERTICULOSIS, COLON 09/23/2007  . FATIGUE 01/22/2009  . GERD 03/02/2007  . GLUCOSE INTOLERANCE 09/23/2007  . HEMORRHOIDS 10/09/2007  . HIATAL HERNIA 10/09/2007  . HYPERCHOLESTEROLEMIA 10/09/2007  . HYPERTENSION 09/23/2007  . HYPOKALEMIA 10/09/2007  . MESENTERIC VASCULAR INSUFFICIENCY 07/06/2008  . MITRAL REGURGITATION 11/27/2008  . MI 10/09/2007  . Other specified forms of hearing loss 08/23/2010  . PERIPHERAL VASCULAR DISEASE 09/23/2007  . PUD 10/09/2007  . RENAL ARTERY STENOSIS 03/02/2007  . TOBACCO ABUSE 04/20/2010  . 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  Unspecified Iron Deficiency Anemia 09/23/2007  . URINARY RETENTION 01/22/2009  . VITAMIN B12 DEFICIENCY 01/03/2010  . WRIST PAIN, RIGHT 06/06/2010  . Impaired glucose tolerance 01/29/2011  . Hearing loss in left ear 01/30/2011  . Blindness of both eyes 01/30/2011  . Hyperlipidemia   . Anemia, iron deficiency   . Atrial flutter   . History of colonoscopy   . HTN (hypertension) 03/21/2011  . Peripheral arterial disease    Past Surgical History  Procedure Laterality Date  . Coronary artery bypass graft  1995    LIMA-LAD, SVG-OM, SVG-D, SVG-PDA  . Renal artery stenting  2004    bilateral  . S/p multiple le vascular bypass      includine fem-pop x 2 LLE  . S/p bilateral cea    . Coronary angioplasty with stent placement  2010    DES x 2 SVG-OM, cutting balloon PTCA SVG-PDA for ISR  . Appendectomy    . S/p lumbar disc surgury      x2  . S/p rle fem bypass feb 2011    . Cardiac catheterization    . Pr vein bypass graft,aorto-fem-pop    . Carotid endarterectomy    . Coronary angioplasty with stent placement  2011    DES to SVG-DIAG, SVG-PDA   Social History:  reports that he has been smoking Cigarettes.  He has a 60 pack-year smoking history. He has never used smokeless tobacco. He reports that he does not drink alcohol or use illicit drugs. Lives at home. where does patient live-- Can do ADLs. Can patient participate in ADLs?  No Known Allergies  Family History  Problem Relation Age of Onset  . Cancer Mother  colon cancer at 42yo  . Heart disease Father   . Cancer Father   . Heart disease Sister   . Cancer Brother     prostate cancer  . Stroke Sister       Prior to Admission medications   Medication Sig Start Date End Date Taking? Authorizing Provider  amLODipine (NORVASC) 10 MG tablet Take 10 mg by mouth daily.   Yes Historical Provider, MD  aspirin 81 MG EC tablet Take 81 mg by mouth daily.     Yes Historical Provider, MD  atorvastatin (LIPITOR) 40 MG tablet Take 40 mg  by mouth daily.   Yes Historical Provider, MD  cilostazol (PLETAL) 100 MG tablet Take 100 mg by mouth 2 (two) times daily.   Yes Historical Provider, MD  clopidogrel (PLAVIX) 75 MG tablet Take 75 mg by mouth daily.   Yes Historical Provider, MD  esomeprazole (NEXIUM) 40 MG capsule Take 1 capsule (40 mg total) by mouth daily. 12/20/12  Yes Corwin Levins, MD  ferrous sulfate 325 (65 FE) MG tablet Take 325 mg by mouth 3 (three) times daily.     Yes Historical Provider, MD  fexofenadine (ALLEGRA) 180 MG tablet Take 180 mg by mouth daily.   Yes Historical Provider, MD  isosorbide mononitrate (IMDUR) 60 MG 24 hr tablet Take 60 mg by mouth daily.   Yes Historical Provider, MD  lisinopril (PRINIVIL,ZESTRIL) 10 MG tablet Take 10 mg by mouth daily.   Yes Historical Provider, MD  metoprolol (LOPRESSOR) 50 MG tablet Take 50 mg by mouth 2 (two) times daily.   Yes Historical Provider, MD  nitroGLYCERIN (NITROSTAT) 0.4 MG SL tablet Place 1 tablet (0.4 mg total) under the tongue every 5 (five) minutes as needed for chest pain. 12/17/12  Yes Rhetta Mura, MD  sertraline (ZOLOFT) 50 MG tablet Take 1 tablet (50 mg total) by mouth daily. 12/20/12  Yes Corwin Levins, MD  traMADol (ULTRAM) 50 MG tablet Take 1 tablet (50 mg total) by mouth every 8 (eight) hours as needed for pain. 12/20/12  Yes Corwin Levins, MD   Physical Exam: Filed Vitals:   12/24/12 0100 12/24/12 0130 12/24/12 0200 12/24/12 0230  BP: 182/67 159/68 156/76 112/46  Pulse: 97 78 76 90  Resp: 15     SpO2: 93% 95% 97% 94%     General:  Well-developed and nourished.  Eyes: Anicteric no pallor.  ENT: No discharge from ears eyes nose and mouth.  Neck: No mass felt.  Cardiovascular: S1-S2 heard.  Respiratory: No rhonchi crepitations.  Abdomen: Soft nontender bowel sounds present.  Skin: No rash.  Musculoskeletal: No edema.  Psychiatric: Appears normal.  Neurologic: Alert and oriented to time place and person. Moves all extremities.  Labs  on Admission:  Basic Metabolic Panel:  Recent Labs Lab 12/23/12 2045  NA 134*  K 3.6  CL 99  CO2 22  GLUCOSE 143*  BUN 10  CREATININE 0.93  CALCIUM 9.4   Liver Function Tests: No results found for this basename: AST, ALT, ALKPHOS, BILITOT, PROT, ALBUMIN,  in the last 168 hours  Recent Labs Lab 12/23/12 2045  LIPASE 22   No results found for this basename: AMMONIA,  in the last 168 hours CBC:  Recent Labs Lab 12/17/12 0540 12/23/12 2045  WBC 6.1 8.0  HGB 12.2* 13.2  HCT 35.3* 38.0*  MCV 85.7 87.2  PLT 193 230   Cardiac Enzymes:  Recent Labs Lab 12/17/12 0759  TROPONINI 0.48*  BNP (last 3 results)  Recent Labs  12/23/12 2045  PROBNP 1188.0*   CBG: No results found for this basename: GLUCAP,  in the last 168 hours  Radiological Exams on Admission: Dg Chest Port 1 View  12/23/2012  *RADIOLOGY REPORT*  Clinical Data: Chest pain, shortness of breath  PORTABLE CHEST - 1 VIEW  Comparison: 12/16/2012 CT  Findings: Aortic prominence and tortuosity with advanced atherosclerosis.  Status post median sternotomy and CABG.  Linear opacities periphery right mid and upper lung, correspond to scarring on recent CT.  Mild left lung base opacities; atelectasis versus infiltrate.  Heart size upper normal to mildly enlarged. Diffuse osteopenia.  IMPRESSION: Mild left lung base opacity; atelectasis versus infiltrate.  Chronic changes are similar to prior.   Original Report Authenticated By: Jearld Lesch, M.D.     EKG: Independently reviewed. Normal sinus rhythm with ST-T changes compatible with old EKG.  Assessment/Plan Active Problems:   CORONARY ARTERY DISEASE   HTN (hypertension)   PVD (peripheral vascular disease) with claudication   Chest pain   1. Chest pain history of CAD status post CABG who has had recent cardiac catheter - cardiology has already evaluated the patient. Patient will be admitted for observation. Cycle cardiac markers. Nitroglycerin when  necessary. Aspirin. 2. Hypertension - continue home medications. 3. Hyperlipidemia - continue home medications. 4. Peripheral vascular disease - continue home medications. 5. COPD - presently not wheezing.    Code Status: Full code.  Family Communication: None.  Disposition Plan: Admit for observation.    Xan Ingraham N. [facility] Pager [phone].  If 7PM-7AM, please contact night-coverage [email or web address] Password [facility] 12/24/2012, 3:27 AM

## 2012-12-24 NOTE — Progress Notes (Signed)
Patient seen and examined. Admitted after midnight secondary to CP. Patient is currently CP free and denies any SOB, epigastric/abdominal discomfort or any other complaints. CE'z X1 negative. Even presentation this time and history provide more concerns for GI source as etiology of his pain, patient with significant risk fx's for ACS. See H&P per Dr. Toniann Fail for further details/info on admission.  Plan: -protonix increased to BID; if symptoms persist will start carafate and might need GI consult for EGd -cycle CE's -cardiology on board and helping with medical management; will follow recommendations -if enzymes remains stable and patient do not experience further complaints will d/c home in am.  Boniface Goffe [phone]

## 2012-12-25 ENCOUNTER — Encounter: Payer: Self-pay | Admitting: Internal Medicine

## 2012-12-25 DIAGNOSIS — R1013 Epigastric pain: Secondary | ICD-10-CM

## 2012-12-25 DIAGNOSIS — Z951 Presence of aortocoronary bypass graft: Secondary | ICD-10-CM

## 2012-12-25 DIAGNOSIS — F411 Generalized anxiety disorder: Secondary | ICD-10-CM

## 2012-12-25 DIAGNOSIS — E7800 Pure hypercholesterolemia, unspecified: Secondary | ICD-10-CM

## 2012-12-25 DIAGNOSIS — K219 Gastro-esophageal reflux disease without esophagitis: Secondary | ICD-10-CM

## 2012-12-25 DIAGNOSIS — J449 Chronic obstructive pulmonary disease, unspecified: Secondary | ICD-10-CM

## 2012-12-25 MED ORDER — METOPROLOL TARTRATE 50 MG PO TABS: 75.0000 mg | ORAL_TABLET | Freq: Two times a day (BID) | ORAL | Status: DC

## 2012-12-25 NOTE — Discharge Summary (Signed)
Physician Discharge Summary  [name] MRN: [id number] DOB/AGE: [birth year] 78 y.o.  Admit date: 12/23/2012 Discharge date: 12/25/2012  Primary Care Physician:  [name], MD  Discharge Diagnoses:    . Chest pain possibly GI source, resolved  . CORONARY ARTERY DISEASE . HTN (hypertension) . PVD (peripheral vascular disease) with claudication  Consults: Cardiology   Recommendations for Outpatient Follow-up:  Patient was recommended to follow-up with GI if symptoms recur.   Allergies:  No Known Allergies   Discharge Medications:   Medication List    TAKE these medications       amLODipine 10 MG tablet  Commonly known as:  NORVASC  Take 10 mg by mouth daily.     aspirin 81 MG EC tablet  Take 81 mg by mouth daily.     atorvastatin 40 MG tablet  Commonly known as:  LIPITOR  Take 40 mg by mouth daily.     cilostazol 100 MG tablet  Commonly known as:  PLETAL  Take 100 mg by mouth 2 (two) times daily.     clopidogrel 75 MG tablet  Commonly known as:  PLAVIX  Take 75 mg by mouth daily.     esomeprazole 40 MG capsule  Commonly known as:  NEXIUM  Take 1 capsule (40 mg total) by mouth daily.     ferrous sulfate 325 (65 FE) MG tablet  Take 325 mg by mouth 3 (three) times daily.     fexofenadine 180 MG tablet  Commonly known as:  ALLEGRA  Take 180 mg by mouth daily.     isosorbide mononitrate 60 MG 24 hr tablet  Commonly known as:  IMDUR  Take 60 mg by mouth daily.     lisinopril 10 MG tablet  Commonly known as:  PRINIVIL,ZESTRIL  Take 10 mg by mouth daily.     metoprolol 50 MG tablet  Commonly known as:  LOPRESSOR  Take 1.5 tablets (75 mg total) by mouth 2 (two) times daily.     nitroGLYCERIN 0.4 MG SL tablet  Commonly known as:  NITROSTAT  Place 1 tablet (0.4 mg total) under the tongue every 5 (five) minutes as needed for chest pain.     sertraline 50 MG tablet  Commonly known as:  ZOLOFT  Take 1 tablet (50 mg total) by mouth daily.      traMADol 50 MG tablet  Commonly known as:  ULTRAM  Take 1 tablet (50 mg total) by mouth every 8 (eight) hours as needed for pain.         Brief H and P: For complete details please refer to admission H and P, but in brief [name] is a 78 y.o. male history of CAD status post CABG presented with complaints of chest pain. Patient started feeling chest pain a night before while eating. Chest pain was associated with epigastric discomfort. Denied any nausea vomiting diarrhea shortness of breath or diaphoresis. Chest pain lasted for almost an hour was retrosternal pressure-like nonradiating. By the time patient reached ER he was chest pain-free. Cardiologist on call had evaluated the patient and patient was admitted for observation.   Hospital Course:   Patient is a 78 year old male with CAD and prior CABG, GERD, COPD, anxiety who presented with atypical chest pain associated with epigastric discomfort. Patient had been recently started on Nexium and he thought that this has probably helped some of his symptoms. His pain was relieved after belching. Cardiology was consulted and patient was admitted for  observation. Serial cardiac enzymes were negative for ACS. Patient remained in normal sinus rhythm on the telemetry. Patient had a recent cardiac cath on 12/16/12 which had shown preserved LV function, severe native and graft disease and was recommended medical therapy. He was cleared by cardiology for discharge. Patient was advised to continue Nexium and follow-up with GI outpatient.     Day of Discharge BP 168/76  Pulse 66  Temp(Src) 98.2 F (36.8 C) (Oral)  Resp 18  Ht 6' (1.829 m)  Wt 77.338 kg (170 lb 8 oz)  BMI 23.12 kg/m2  SpO2 92%  Physical Exam: General: Alert and awake oriented x3 not in any acute distress. CVS: S1-S2 clear 2/6SM Chest: clear to auscultation bilaterally, no wheezing rales or rhonchi Abdomen: soft nontender, nondistended, normal bowel sounds Extremities: no  cyanosis, clubbing or edema noted bilaterally    The results of significant diagnostics from this hospitalization (including imaging, microbiology, ancillary and laboratory) are listed below for reference.    LAB RESULTS: Basic Metabolic Panel:  Recent Labs Lab 12/23/12 2045 12/24/12 0935  NA 134* 137  K 3.6 3.9  CL 99 102  CO2 22 29  GLUCOSE 143* 94  BUN 10 8  CREATININE 0.93 0.85  CALCIUM 9.4 9.1    Recent Labs Lab 12/23/12 2045  LIPASE 22  CBC:  Recent Labs Lab 12/23/12 2045 12/24/12 0935  WBC 8.0 6.7  HGB 13.2 12.8*  HCT 38.0* 37.5*  MCV 87.2 87.2  PLT 230 204   Cardiac Enzymes:  Recent Labs Lab 12/24/12 1300 12/24/12 1753  TROPONINI <0.30 <0.30   BNP: No components found with this basename: POCBNP,  CBG: No results found for this basename: GLUCAP,  in the last 168 hours  Significant Diagnostic Studies:  Dg Chest Port 1 View  12/23/2012   *RADIOLOGY REPORT*  Clinical Data: Chest pain, shortness of breath  PORTABLE CHEST - 1 VIEW  Comparison: 12/16/2012 CT  Findings: Aortic prominence and tortuosity with advanced atherosclerosis.  Status post median sternotomy and CABG.  Linear opacities periphery right mid and upper lung, correspond to scarring on recent CT.  Mild left lung base opacities; atelectasis versus infiltrate.  Heart size upper normal to mildly enlarged. Diffuse osteopenia.  IMPRESSION: Mild left lung base opacity; atelectasis versus infiltrate.  Chronic changes are similar to prior.   Original Report Authenticated By: Jearld Lesch, M.D.    Disposition and Follow-up: Discharge Orders   Future Appointments Provider Department Dept Phone   01/08/2013 2:45 PM Kathleene Hazel, MD [facility] Main [facility] [place]) [phone]   03/21/2013 10:00 AM Corwin Levins, MD [facility] [phone]   06/04/2013 10:00 AM [facility] [phone]   06/04/2013 10:30 AM  [facility] [phone]   06/04/2013 11:00 AM Evern Bio, NP [facility] [phone]   Future Orders Complete By Expires     Diet - low sodium heart healthy  As directed     Discharge instructions  As directed     Comments:      Please have GI follow-up if you have repeat symptoms of chest pain. Continue Nexium daily.    Increase activity slowly  As directed         DISPOSITION: HOME  DIET: Heart healthy diet  ACTIVITY: As tolerated   DISCHARGE FOLLOW-UP Follow-up Information   Follow up with [name], MD. Schedule an appointment as soon as  possible for a visit in 10 days. [facility] FOLLOW-UP)    Contact information:   [address] [place] [address] [phone]       Time spent on Discharge: 35 MINS  Signed:   RAI,RIPUDEEP M.D. [facility] 12/25/2012, 11:25 AM Pager: [phone]

## 2012-12-25 NOTE — Progress Notes (Signed)
TELEMETRY: Reviewed telemetry pt in NSR with rare PVC: Filed Vitals:   12/24/12 1041 12/24/12 1748 12/24/12 2100 12/25/12 0455  BP: 160/75 129/50 110/59 153/71  Pulse: 98 70 76 69  Temp:  97.5 F (36.4 C) 98.3 F (36.8 C) 98.2 F (36.8 C)  TempSrc:  Oral Oral Oral  Resp:  18 18 18   Height:      Weight:      SpO2:  96% 94% 92%    Intake/Output Summary (Last 24 hours) at 12/25/12 0717 Last data filed at 12/24/12 2200  Gross per 24 hour  Intake    123 ml  Output      0 ml  Net    123 ml    SUBJECTIVE Feels much better this am. Denies chest or epigastric pain now.  LABS: Basic Metabolic Panel:  Recent Labs  30/86/57 2045 12/24/12 0935  NA 134* 137  K 3.6 3.9  CL 99 102  CO2 22 29  GLUCOSE 143* 94  BUN 10 8  CREATININE 0.93 0.85  CALCIUM 9.4 9.1    Recent Labs  12/23/12 2045  LIPASE 22   CBC:  Recent Labs  12/23/12 2045 12/24/12 0935  WBC 8.0 6.7  HGB 13.2 12.8*  HCT 38.0* 37.5*  MCV 87.2 87.2  PLT 230 204    Radiology/Studies:  Ct Angio Chest Pe W/cm &/or Wo Cm  12/16/2012  *RADIOLOGY REPORT*  Clinical Data: 78 year old male with chest pain and pressure.  CT ANGIOGRAPHY CHEST  Technique:  Multidetector CT imaging of the chest using the standard protocol during bolus administration of intravenous contrast. Multiplanar reconstructed images including MIPs were obtained and reviewed to evaluate the vascular anatomy.  Contrast: 80mL OMNIPAQUE IOHEXOL 350 MG/ML SOLN  Comparison: Portable chest radiograph from the same day and earlier. CT abdomen 07/23/2008.  Findings: Good contrast bolus timing in the pulmonary arterial tree.  No focal filling defect identified in the pulmonary arterial tree to suggest the presence of acute pulmonary embolism.  Severe widespread atherosclerosis, mostly calcified plaque. Diffuse involvement of the aorta and great vessels.  Coronary artery involvement.  No mediastinal lymphadenopathy.  Negative visualized thyroid.  Previous  median sternotomy. No acute osseous abnormality identified.  Major airways are patent.  Widespread peribronchial thickening. Mild upper lobe bronchiectasis.  Mild to moderate upper lobe predominant emphysema.  In the right hemithorax there is pleural thickening and/or trace pleural effusion.  Some small calcified pleural plaques are noted.  None of the areas of pleural thickening are mass-like.  There is associated subpleural scarring and reticular opacity.  There are more confluent subpleural and streaky areas of pulmonary opacity.  None of these parenchymal foci are mass like. The findings at the right lung base have progressed since 2009.  Superimposed dependent atelectasis in the right lung.  In the left lung there is also dependent atelectasis.  There is mild scarring in the lingula.  Negative visualized right upper quadrant abdominal viscera. Chronic left adrenal mass measures approximately 20 mm and appears not significantly changed since 2009.  This has densitometry of less than 10 HU today (8 HU).  IMPRESSION: 1. No evidence of acute pulmonary embolus. 2.  Chronic lung disease with widespread right lung pleural thickening, progressed at the lung base since the 2009 CT abdomen. Associated scattered subpleural and confluent scarring.   No areas suspicious for acute lung infection.  No focal suspicious or mass-like area identified in the right lung, but imaging surveillance (such as periodic chest radiographs) of  this patient's lungs would be prudent. 3. Advanced widespread atherosclerosis. 4.  Chronic benign appearing left adrenal mass.  Favor adenoma.   Original Report Authenticated By: Erskine Speed, M.D.    Dg Chest Port 1 View  12/23/2012  *RADIOLOGY REPORT*  Clinical Data: Chest pain, shortness of breath  PORTABLE CHEST - 1 VIEW  Comparison: 12/16/2012 CT  Findings: Aortic prominence and tortuosity with advanced atherosclerosis.  Status post median sternotomy and CABG.  Linear opacities periphery right  mid and upper lung, correspond to scarring on recent CT.  Mild left lung base opacities; atelectasis versus infiltrate.  Heart size upper normal to mildly enlarged. Diffuse osteopenia.  IMPRESSION: Mild left lung base opacity; atelectasis versus infiltrate.  Chronic changes are similar to prior.   Original Report Authenticated By: Jearld Lesch, M.D.    Dg Chest Port 1 View  12/16/2012  *RADIOLOGY REPORT*  Clinical Data: 78 year old male with chest and epigastric pain.  PORTABLE CHEST - 1 VIEW  Comparison: 05/14/2012 and earlier.  Findings: AP portable semi upright view 0047 hours.  Stable lung volumes.  Stable cardiac size and mediastinal contours.  Stable sequelae of CABG.  No pneumothorax.  Chronic increased interstitial markings asymmetrically greater on the right again noted.  No pleural effusion.  Mildly increased confluent opacity on the right. Otherwise no acute pulmonary opacity.  IMPRESSION: Chronic lung disease. A small area of mild increased confluence of opacity on the right suspicious for a right lung infectious exacerbation.   Original Report Authenticated By: Erskine Speed, M.D.    Cardiac Cath Note  [name]  [id number]  1935-07-18  Procedure: left Heart Cardiac Catheterization Note  Indications: CP, + Troponin  Procedure Details  Consent: Obtained  Time Out: Verified patient identification, verified procedure, site/side was marked, verified correct patient position, special equipment/implants available, Radiology Safety Procedures followed, medications/allergies/relevent history reviewed, required imaging and test results available. Performed  Medications:  Fentanyl: 50 mcg IV  Versed:  The right femoral artery was easily canulated using a modified Seldinger technique. We used a whooley wire to access the central circulation  Hemodynamics:  LV pressure: 157/8  Aortic pressure: 157/59  Angiography  Left Main: occluded  Left anterior Descending: occluded  Left Circumflex:  occluded  Right Coronary Artery: occluded  SVG to RCA: Occluded proximally  SVG to OM: large graft. Patent stents in the body of the graft. The anastomosis to the OM is subtotally occluded. There is TIMI II filling of OM from the graft.  SVG to Diag: Patent graft. The mid graft stents are patent. The anastomosis to the diag is normal. There is slight filling of the LAD in a retrograde fashion from the diag  The aortic arch was heavily calcified. The 3 great vessels were very heavily calcified with diffuse irregularities.  LIMA to LAD: We were unable to canulate the left subclavian due to severe calcified disease. The right carotid artery has a tight irregular stenosis at the origin. The left subclavian has moderate disease.  LV Gram: well preserved LV function. EF 50% -55%  Complications: No apparent complications  Patient did tolerate procedure well.  Contrast used: 95 CC  Conclusions:  1. Severe native and graft disease.  2. Patent SVG to OM and SVG to diag. I was not able to visualize the LIMA due to inability to navigate the left subclavian. Aortic root angiogram reveals significant disease in the right carotid and left subclavian. I decided to not try any further attempts to get  up into the left subclavian.  3. Well preserved LV function.  Will continue medical therapy.  Vesta Mixer, [name]., MD, [facility]  12/16/2012, 2:18 PM  PHYSICAL EXAM General: Well developed, well nourished, in no acute distress. Head: Normal Neck: Negative for carotid bruits. JVD not elevated. Lungs: Course breath sounds. Heart: RRR S1 S2 with 2/6 systolic murmur at the apex.  Abdomen: Soft, non-tender, non-distended with normoactive bowel sounds. No hepatomegaly. No rebound/guarding. No obvious abdominal masses. Msk:  Strength and tone appears normal for age. Extremities: No clubbing, cyanosis or edema.  Distal pedal pulses are 2+ and equal bilaterally. Neuro: Alert and oriented X 3. Moves all extremities  spontaneously. Psych:  Responds to questions appropriately with a normal affect.  ASSESSMENT AND PLAN: 1. Epigastric pain. Suspect more related to GI source.All troponins negative.  2. CAD s/p CABG. Occluded grafts to RCA and OM. Continue medical therapy with ASA, beta blocker, Norvasc, and nitrates. 3. HTN- BP labile- improved with  increase in metoprolol to 75 mg bid.   Stable from cardiac standpoint. OK for DC from our perspective. Active Problems:   CORONARY ARTERY DISEASE   HTN (hypertension)   PVD (peripheral vascular disease) with claudication   Chest pain   S/P CABG (coronary artery bypass graft)    Signed, Peter [place] MD,FACC 12/25/2012 7:17 AM

## 2013-01-01 ENCOUNTER — Other Ambulatory Visit: Payer: Self-pay | Admitting: Cardiovascular Disease

## 2013-01-03 ENCOUNTER — Encounter: Payer: Self-pay | Admitting: Internal Medicine

## 2013-01-03 ENCOUNTER — Ambulatory Visit (INDEPENDENT_AMBULATORY_CARE_PROVIDER_SITE_OTHER): Payer: Medicare Other | Admitting: Internal Medicine

## 2013-01-03 VITALS — BP 120/80 | HR 83 | Temp 97.0°F | Ht 72.0 in | Wt 170.5 lb

## 2013-01-03 DIAGNOSIS — I10 Essential (primary) hypertension: Secondary | ICD-10-CM

## 2013-01-03 DIAGNOSIS — F411 Generalized anxiety disorder: Secondary | ICD-10-CM

## 2013-01-03 DIAGNOSIS — R079 Chest pain, unspecified: Secondary | ICD-10-CM

## 2013-01-03 DIAGNOSIS — R7302 Impaired glucose tolerance (oral): Secondary | ICD-10-CM

## 2013-01-03 DIAGNOSIS — R7309 Other abnormal glucose: Secondary | ICD-10-CM

## 2013-01-03 MED ORDER — RANITIDINE HCL 150 MG PO TABS: 150.0000 mg | ORAL_TABLET | Freq: Every day | ORAL | Status: DC

## 2013-01-03 MED ORDER — SERTRALINE HCL 100 MG PO TABS: 100.0000 mg | ORAL_TABLET | Freq: Every day | ORAL | Status: DC

## 2013-01-03 NOTE — Patient Instructions (Signed)
Please take all new medication as prescribed - the zantac in the evening OK to increase the zoloft to 100 mg per day Please continue all other medications as before, and refills have been done if requested. Please have the pharmacy call with any other refills you may need. Please continue your efforts at being more active, low cholesterol diet, and weight control.

## 2013-01-03 NOTE — Assessment & Plan Note (Signed)
Ok to increase zoloft as tolerates ok, seems to help some, but needs better - for 100 qd

## 2013-01-03 NOTE — Assessment & Plan Note (Signed)
Agree likely GI - to add zantac qhs, Please continue all other medications as before,  to f/u any worsening symptoms or concerns

## 2013-01-03 NOTE — Assessment & Plan Note (Signed)
stable overall by history and exam, recent data reviewed with pt, and pt to continue medical treatment as before,  to f/u any worsening symptoms or concerns BP Readings from Last 3 Encounters:  01/03/13 120/80  12/25/12 168/76  12/20/12 130/62

## 2013-01-03 NOTE — Assessment & Plan Note (Signed)
stable overall by history and exam, recent data reviewed with pt, and pt to continue medical treatment as before,  to f/u any worsening symptoms or concerns Lab Results  Component Value Date   HGBA1C 6.1 05/14/2012

## 2013-01-03 NOTE — Progress Notes (Signed)
Subjective:    [name], male    DOB: 1934-11-06, 78 y.o.   MRN: [id number]  HPI here after recent hospn with CP, r/o for MI, thought to be more c/w GI source and d/c home, since then has had further similar discomfort lower SSCP with "gas" better with belching, but only seems to happen more in the evening and nighttime, despite good compliance with nexium, does not happen with eating such as intestinal claudication, but less often with corn flakes to eat, more often with BBQ. Admits to "some" dietary noncompliacne. Denies worsening depressive symptoms, suicidal ideation, or panic; has ongoing anxiety, with increased recently but not just with recent hospn, zoloft helped some but asks for increase dose.  Still smoking, but is trying to change over to ecigs Past Medical History  Diagnosis Date  . CEREBROVASCULAR ACCIDENT, HX OF 03/02/2007  . CHEST PAIN 01/31/2010  . CHRONIC OBSTRUCTIVE PULMONARY DISEASE, ACUTE EXACERBATION 02/18/2009  . COPD 03/02/2007  . CORONARY ARTERY DISEASE 03/02/2007  . DEPRESSION 09/23/2007    anxiety  . DISEASE, PERIPHERAL VASCULAR NEC 03/02/2007  . DIVERTICULOSIS, COLON 09/23/2007  . FATIGUE 01/22/2009  . GERD 03/02/2007  . GLUCOSE INTOLERANCE 09/23/2007  . HEMORRHOIDS 10/09/2007  . HIATAL HERNIA 10/09/2007  . HYPERCHOLESTEROLEMIA 10/09/2007  . HYPERTENSION 09/23/2007  . HYPOKALEMIA 10/09/2007  . MESENTERIC VASCULAR INSUFFICIENCY 07/06/2008  . MITRAL REGURGITATION 11/27/2008  . MI 10/09/2007  . Other specified forms of hearing loss 08/23/2010  . PERIPHERAL VASCULAR DISEASE 09/23/2007  . PUD 10/09/2007  . RENAL ARTERY STENOSIS 03/02/2007  . TOBACCO ABUSE 04/20/2010  . 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 Unspecified Iron Deficiency Anemia 09/23/2007  . URINARY RETENTION 01/22/2009  . VITAMIN B12 DEFICIENCY 01/03/2010  . WRIST PAIN, RIGHT 06/06/2010  . Impaired glucose tolerance 01/29/2011  . Hearing loss in left ear 01/30/2011  . Blindness of both eyes 01/30/2011   . Hyperlipidemia   . Anemia, iron deficiency   . Atrial flutter   . History of colonoscopy   . HTN (hypertension) 03/21/2011  . Peripheral arterial disease    Past Surgical History  Procedure Laterality Date  . Coronary artery bypass graft  1995    LIMA-LAD, SVG-OM, SVG-D, SVG-PDA  . Renal artery stenting  2004    bilateral  . S/p multiple le vascular bypass      includine fem-pop x 2 LLE  . S/p bilateral cea    . Coronary angioplasty with stent placement  2010    DES x 2 SVG-OM, cutting balloon PTCA SVG-PDA for ISR  . Appendectomy    . S/p lumbar disc surgury      x2  . S/p rle fem bypass feb 2011    . Cardiac catheterization    . Pr vein bypass graft,aorto-fem-pop    . Carotid endarterectomy    . Coronary angioplasty with stent placement  2011    DES to SVG-DIAG, SVG-PDA    reports that he has been smoking Cigarettes.  He has a 60 pack-year smoking history. He has never used smokeless tobacco. He reports that he does not drink alcohol or use illicit drugs. family history includes Cancer in his brother, father, and mother; Heart disease in his father and sister; and Stroke in his sister. No Known Allergies Current Outpatient Prescriptions on File Prior to Visit  Medication Sig Dispense Refill  . amLODipine (NORVASC) 10 MG tablet Take 10 mg by mouth daily.      Marland Kitchen aspirin 81 MG EC  tablet Take 81 mg by mouth daily.        Marland Kitchen atorvastatin (LIPITOR) 40 MG tablet Take 40 mg by mouth daily.      . cilostazol (PLETAL) 100 MG tablet Take 100 mg by mouth 2 (two) times daily.      . clopidogrel (PLAVIX) 75 MG tablet Take 75 mg by mouth daily.      Marland Kitchen esomeprazole (NEXIUM) 40 MG capsule Take 1 capsule (40 mg total) by mouth daily.  90 capsule  3  . ferrous sulfate 325 (65 FE) MG tablet Take 325 mg by mouth 3 (three) times daily.        . fexofenadine (ALLEGRA) 180 MG tablet Take 180 mg by mouth daily.      . isosorbide mononitrate (IMDUR) 60 MG 24 hr tablet Take 60 mg by mouth daily.       Marland Kitchen lisinopril (PRINIVIL,ZESTRIL) 10 MG tablet Take 10 mg by mouth daily.      . metoprolol (LOPRESSOR) 50 MG tablet Take 1.5 tablets (75 mg total) by mouth 2 (two) times daily.  60 tablet  3  . nitroGLYCERIN (NITROSTAT) 0.4 MG SL tablet Place 1 tablet (0.4 mg total) under the tongue every 5 (five) minutes as needed for chest pain.  30 tablet  0  . traMADol (ULTRAM) 50 MG tablet Take 1 tablet (50 mg total) by mouth every 8 (eight) hours as needed for pain.  60 tablet  0   No current facility-administered medications on file prior to visit.   Review of Systems  Constitutional: Negative for unexpected weight change, or unusual diaphoresis  HENT: Negative for tinnitus.   Eyes: Negative for photophobia and visual disturbance.  Respiratory: Negative for choking and stridor.   Gastrointestinal: Negative for vomiting and blood in stool.  Genitourinary: Negative for hematuria and decreased urine volume.  Musculoskeletal: Negative for acute joint swelling Skin: Negative for color change and wound.  Neurological: Negative for tremors and numbness other than noted  Psychiatric/Behavioral: Negative for decreased concentration or  hyperactivity.       Objective:   Physical Exam BP 120/80  Pulse 83  Temp(Src) 97 F (36.1 C) (Oral)  Ht 6' (1.829 m)  Wt 170 lb 8 oz (77.338 kg)  BMI 23.12 kg/m2  SpO2 93% VS noted, not ill appearing Constitutional: Pt appears well-developed and well-nourished.  HENT: Head: NCAT.  Right Ear: External ear normal.  Left Ear: External ear normal.  Eyes: Conjunctivae and EOM are normal. Pupils are equal, round, and reactive to light.  Neck: Normal range of motion. Neck supple.  Cardiovascular: Normal rate and regular rhythm.   Pulmonary/Chest: Effort normal and breath sounds normal.  Abd:  Soft, NT, non-distended, + BS Neurological: Pt is alert. Not confused  Skin: Skin is warm. No erythema.  Psychiatric: Pt behavior is normal. Thought content normal. 1+ nervous      Assessment & Plan:

## 2013-01-08 ENCOUNTER — Encounter: Payer: Self-pay | Admitting: Cardiovascular Disease

## 2013-01-08 ENCOUNTER — Ambulatory Visit (INDEPENDENT_AMBULATORY_CARE_PROVIDER_SITE_OTHER): Payer: Medicare Other | Admitting: Cardiovascular Disease

## 2013-01-08 VITALS — BP 148/50 | HR 76 | Ht 72.0 in | Wt 173.8 lb

## 2013-01-08 DIAGNOSIS — Z720 Tobacco use: Secondary | ICD-10-CM

## 2013-01-08 DIAGNOSIS — I2510 Atherosclerotic heart disease of native coronary artery without angina pectoris: Secondary | ICD-10-CM

## 2013-01-08 DIAGNOSIS — F17200 Nicotine dependence, unspecified, uncomplicated: Secondary | ICD-10-CM

## 2013-01-08 DIAGNOSIS — I739 Peripheral vascular disease, unspecified: Secondary | ICD-10-CM

## 2013-01-08 MED ORDER — METOPROLOL TARTRATE 100 MG PO TABS: 100.0000 mg | ORAL_TABLET | Freq: Two times a day (BID) | ORAL | Status: DC

## 2013-01-08 NOTE — Progress Notes (Signed)
History of Present Illness: 78 yo WM with history of CAD s/p 4V CABG 1995 and stenting of saphenous vein grafts, bilateral CEA, mesenteric artery bypass, 2 failed left fem-pop bypasses, renal artery stenting, iliac artery stenting, right common femoral artery endarterctomy February 2011, HTN, hyperlipidemia and ongoing tobacco abuse here today for follow up. [name] was admitted to [facility] 4/410 with complaints of chest pain. He ruled out for an MI with serial cardiac enzymes. Cardiac cath on November 16, 2008 showed severe triple vessel disease, patent LIMA to LAD, patent SVG to Diagonal, patent SVG to OM with serial high grade lesions and SVG to PDA with severe in stent restenosis of the body of the vein graft. PCI was performed on 11/20/08 with placement of 2 drug eluting stents in the body of SVG to OM, cutting balloon angioplasty in the segment of in-stent restenosis in the SVG to PDA. He was admittted to [facility] in February 2011 for right femoral artery endarterectomy and right femoral to below the knee popliteal bypass because of a non-healing wound of his right great toe. Dr. Edilia Bo follows his vascular disease. He was readmitted in May 2011 to [facility] with chest pain. He ruled out for an MI with serial cardiac enzymes. I discussed a cath at that time but his Hgb was low at 7. He was seen by GI and had an upper and lower endoscopy. Nothing was found on either endoscopy. Etiology of anemia presumed to be iron deficiency. I saw him on 09/23/10 and he was c/o chest pain at night. I arranged a left heart cath on 09/27/10. He was found to have severe in-stent restenosis proximal body of SVG to PDA and severe stenosis distal body of SVG to Diagonal. Drug eluting stents were placed in both areas. Post PCI he had black stools and Hbg dropped to 6.0. He was seen by GI. Upper endocscopy showed AVM stomach and dudoenum as well as pyloric ulcler. He was transfused and started on a PPI. He was   Readmitted to [facility] 12/15/12 with chest pain. Cardiac cath 12/15/12 with occlusion of all native vessels, occluded SVG to PDA, patent SVG to OM but the anastamosis was severely diseased and not favorable for PCI, patent SVG to Diagonal, unable to engage LIMA to LAD. Medical management pursues. Readmitted May 12,2014 with epigastric pain not felt to be cardiac related, ruled out with cardiac markers.   He is here today for follow up. He continues to smoke 1 ppd. Overall feeling well. No chest pain. No change in his breathing. He has stable claudication. He has been taking all of his medications.   Primary Care Physician: [name]  Last Lipid Profile:Lipid Panel     Component Value Date/Time   CHOL 144 05/14/2012 1124   TRIG 85.0 05/14/2012 1124   HDL 42.40 05/14/2012 1124   CHOLHDL 3 05/14/2012 1124   VLDL 17.0 05/14/2012 1124   LDLCALC 85 05/14/2012 1124     Past Medical History  Diagnosis Date  . CEREBROVASCULAR ACCIDENT, HX OF 03/02/2007  . CHEST PAIN 01/31/2010  . CHRONIC OBSTRUCTIVE PULMONARY DISEASE, ACUTE EXACERBATION 02/18/2009  . COPD 03/02/2007  . CORONARY ARTERY DISEASE 03/02/2007  . DEPRESSION 09/23/2007    anxiety  . DISEASE, PERIPHERAL VASCULAR NEC 03/02/2007  . DIVERTICULOSIS, COLON 09/23/2007  . FATIGUE 01/22/2009  . GERD 03/02/2007  . GLUCOSE INTOLERANCE 09/23/2007  . HEMORRHOIDS 10/09/2007  . HIATAL HERNIA 10/09/2007  . HYPERCHOLESTEROLEMIA 10/09/2007  . HYPERTENSION 09/23/2007  .  HYPOKALEMIA 10/09/2007  . MESENTERIC VASCULAR INSUFFICIENCY 07/06/2008  . MITRAL REGURGITATION 11/27/2008  . MI 10/09/2007  . Other specified forms of hearing loss 08/23/2010  . PERIPHERAL VASCULAR DISEASE 09/23/2007  . PUD 10/09/2007  . RENAL ARTERY STENOSIS 03/02/2007  . TOBACCO ABUSE 04/20/2010  . 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 Unspecified Iron Deficiency Anemia 09/23/2007  . URINARY RETENTION 01/22/2009  . VITAMIN B12 DEFICIENCY 01/03/2010  . WRIST PAIN, RIGHT 06/06/2010  . Impaired glucose  tolerance 01/29/2011  . Hearing loss in left ear 01/30/2011  . Blindness of both eyes 01/30/2011  . Hyperlipidemia   . Anemia, iron deficiency   . Atrial flutter   . History of colonoscopy   . HTN (hypertension) 03/21/2011  . Peripheral arterial disease     Past Surgical History  Procedure Laterality Date  . Coronary artery bypass graft  1995    LIMA-LAD, SVG-OM, SVG-D, SVG-PDA  . Renal artery stenting  2004    bilateral  . S/p multiple le vascular bypass      includine fem-pop x 2 LLE  . S/p bilateral cea    . Coronary angioplasty with stent placement  2010    DES x 2 SVG-OM, cutting balloon PTCA SVG-PDA for ISR  . Appendectomy    . S/p lumbar disc surgury      x2  . S/p rle fem bypass feb 2011    . Cardiac catheterization    . Pr vein bypass graft,aorto-fem-pop    . Carotid endarterectomy    . Coronary angioplasty with stent placement  2011    DES to SVG-DIAG, SVG-PDA    Current Outpatient Prescriptions  Medication Sig Dispense Refill  . amLODipine (NORVASC) 10 MG tablet Take 10 mg by mouth daily.      Marland Kitchen aspirin 81 MG EC tablet Take 81 mg by mouth daily.        Marland Kitchen atorvastatin (LIPITOR) 40 MG tablet Take 40 mg by mouth daily.      . cilostazol (PLETAL) 100 MG tablet Take 100 mg by mouth 2 (two) times daily.      . clopidogrel (PLAVIX) 75 MG tablet Take 75 mg by mouth daily.      Marland Kitchen esomeprazole (NEXIUM) 40 MG capsule Take 1 capsule (40 mg total) by mouth daily.  90 capsule  3  . ferrous sulfate 325 (65 FE) MG tablet Take 325 mg by mouth 3 (three) times daily.        . fexofenadine (ALLEGRA) 180 MG tablet Take 180 mg by mouth daily.      . isosorbide mononitrate (IMDUR) 60 MG 24 hr tablet Take 60 mg by mouth daily.      Marland Kitchen lisinopril (PRINIVIL,ZESTRIL) 10 MG tablet Take 10 mg by mouth daily.      . metoprolol (LOPRESSOR) 50 MG tablet Take 1.5 tablets (75 mg total) by mouth 2 (two) times daily.  60 tablet  3  . nitroGLYCERIN (NITROSTAT) 0.4 MG SL tablet Place 1 tablet (0.4 mg  total) under the tongue every 5 (five) minutes as needed for chest pain.  30 tablet  0  . ranitidine (ZANTAC) 150 MG tablet Take 1 tablet (150 mg total) by mouth at bedtime.  90 tablet  3  . sertraline (ZOLOFT) 100 MG tablet Take 1 tablet (100 mg total) by mouth daily.  90 tablet  3  . traMADol (ULTRAM) 50 MG tablet Take 1 tablet (50 mg total) by mouth every 8 (eight) hours as needed for pain.  60 tablet  0   No current facility-administered medications for this visit.    No Known Allergies  History   Social History  . Marital Status: Married    Spouse Name: N/A    Number of Children: 2  . Years of Education: N/A   Occupational History  . 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 Smoking status: Current Every Day Smoker -- 1.00 packs/day for 60 years    Types: Cigarettes  . Smokeless tobacco: Never Used     Comment: 1 ppd  . Alcohol Use: No  . Drug Use: No  . Sexually Active: Not on file   Other Topics Concern  . Not on file   Social History Narrative   Daily caffeine use 1/2 decaf 1/2 caffeine coffee a day    Family History  Problem Relation Age of Onset  . Cancer Mother     colon cancer at 34yo  . Heart disease Father   . Cancer Father   . Heart disease Sister   . Cancer Brother     prostate cancer  . Stroke Sister     Review of Systems:  As stated in the HPI and otherwise negative.   BP 148/50  Pulse 76  Ht 6' (1.829 m)  Wt 173 lb 12.8 oz (78.835 kg)  BMI 23.57 kg/m2  Physical Examination: General: Well developed, well nourished, NAD HEENT: OP clear, mucus membranes moist SKIN: warm, dry. No rashes. Neuro: No focal deficits Musculoskeletal: Muscle strength 5/5 all ext Psychiatric: Mood and affect normal Neck: No JVD, bilateral carotid bruits, no thyromegaly, no lymphadenopathy. Lungs:Clear bilaterally, no wheezes, rhonci, crackles Cardiovascular: Regular rate and rhythm. No murmurs, gallops or  rubs. Abdomen:Soft. Bowel sounds present. Non-tender.  Extremities: No lower extremity edema. Pulses are non-palpable in the bilateral DP/PT.  Cardiac cath 12/15/12: Left Main: occluded  Left anterior Descending: occluded  Left Circumflex: occluded  Right Coronary Artery: occluded  SVG to RCA: Occluded proximally  SVG to OM: large graft. Patent stents in the body of the graft. The anastomosis to the OM is subtotally occluded. There is TIMI II filling of OM from the graft.  SVG to Diag: Patent graft. The mid graft stents are patent. The anastomosis to the diag is normal. There is slight filling of the LAD in a retrograde fashion from the diag  The aortic arch was heavily calcified. The 3 great vessels were very heavily calcified with diffuse irregularities.  LIMA to LAD: We were unable to canulate the left subclavian due to severe calcified disease. The right carotid artery has a tight irregular stenosis at the origin. The left subclavian has moderate disease.  LV Gram: well preserved LV function. EF 50% -55%  Assessment and Plan:   1. CORONARY ARTERY DISEASE: He has end-stage CAD with few options left for revascularization. Stable at this time. I have spent 20 minutes with the patient and his family reviewing the complexity of his CAD and the limited options for future caths and revascularization.  Continue current therapy.     2. HTN: BP still elevated. This is likely worsened by his smoking. Will continue current meds and increase Lopressor to 100 mg po BID.        3. PAD: Lower ext disease is followed by Dr. Durwin Nora with [facility]. Carotids to be updated this year.     4. TOBACCO ABUSE: Smoking cessation encouraged.

## 2013-01-08 NOTE — Patient Instructions (Addendum)
Your physician wants you to follow-up in: 6 months.  You will receive a reminder letter in the mail two months in advance. If you don't receive a letter, please call our office to schedule the follow-up appointment.  Your physician has recommended you make the following change in your medication: Increase lopressor to 100 mg by mouth twice daily

## 2013-01-17 ENCOUNTER — Telehealth: Payer: Self-pay | Admitting: Internal Medicine

## 2013-01-17 ENCOUNTER — Ambulatory Visit: Payer: Medicare Other | Admitting: Internal Medicine

## 2013-01-17 NOTE — Telephone Encounter (Signed)
No charge. 

## 2013-02-01 ENCOUNTER — Other Ambulatory Visit: Payer: Self-pay | Admitting: Cardiovascular Disease

## 2013-02-07 IMAGING — CR DG CHEST 2V
2 series · 2 of 2 positions shown · non-contrast
Comparison: 08/23/2010

CLINICAL DATA: Wheezing, chest pain, smoker.

CHEST - 2 VIEW

[view not recorded (1 of 2)]
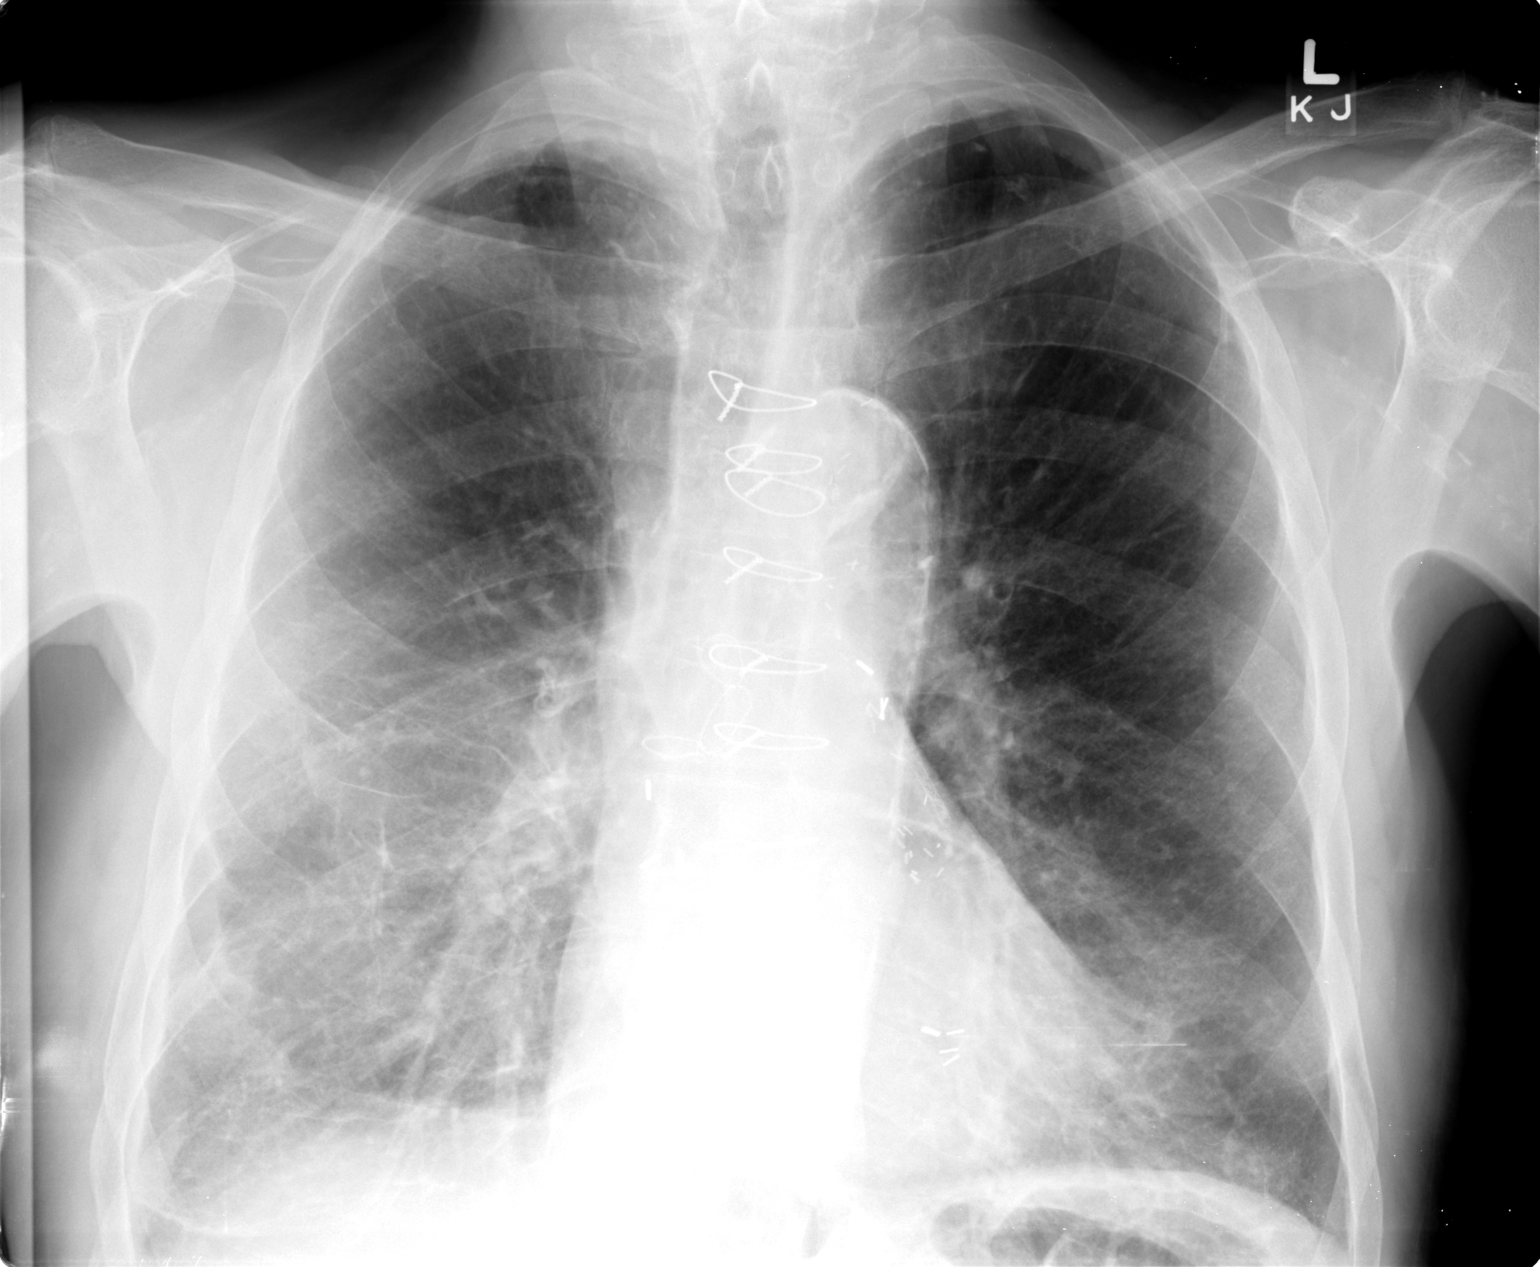

[view not recorded (2 of 2)]
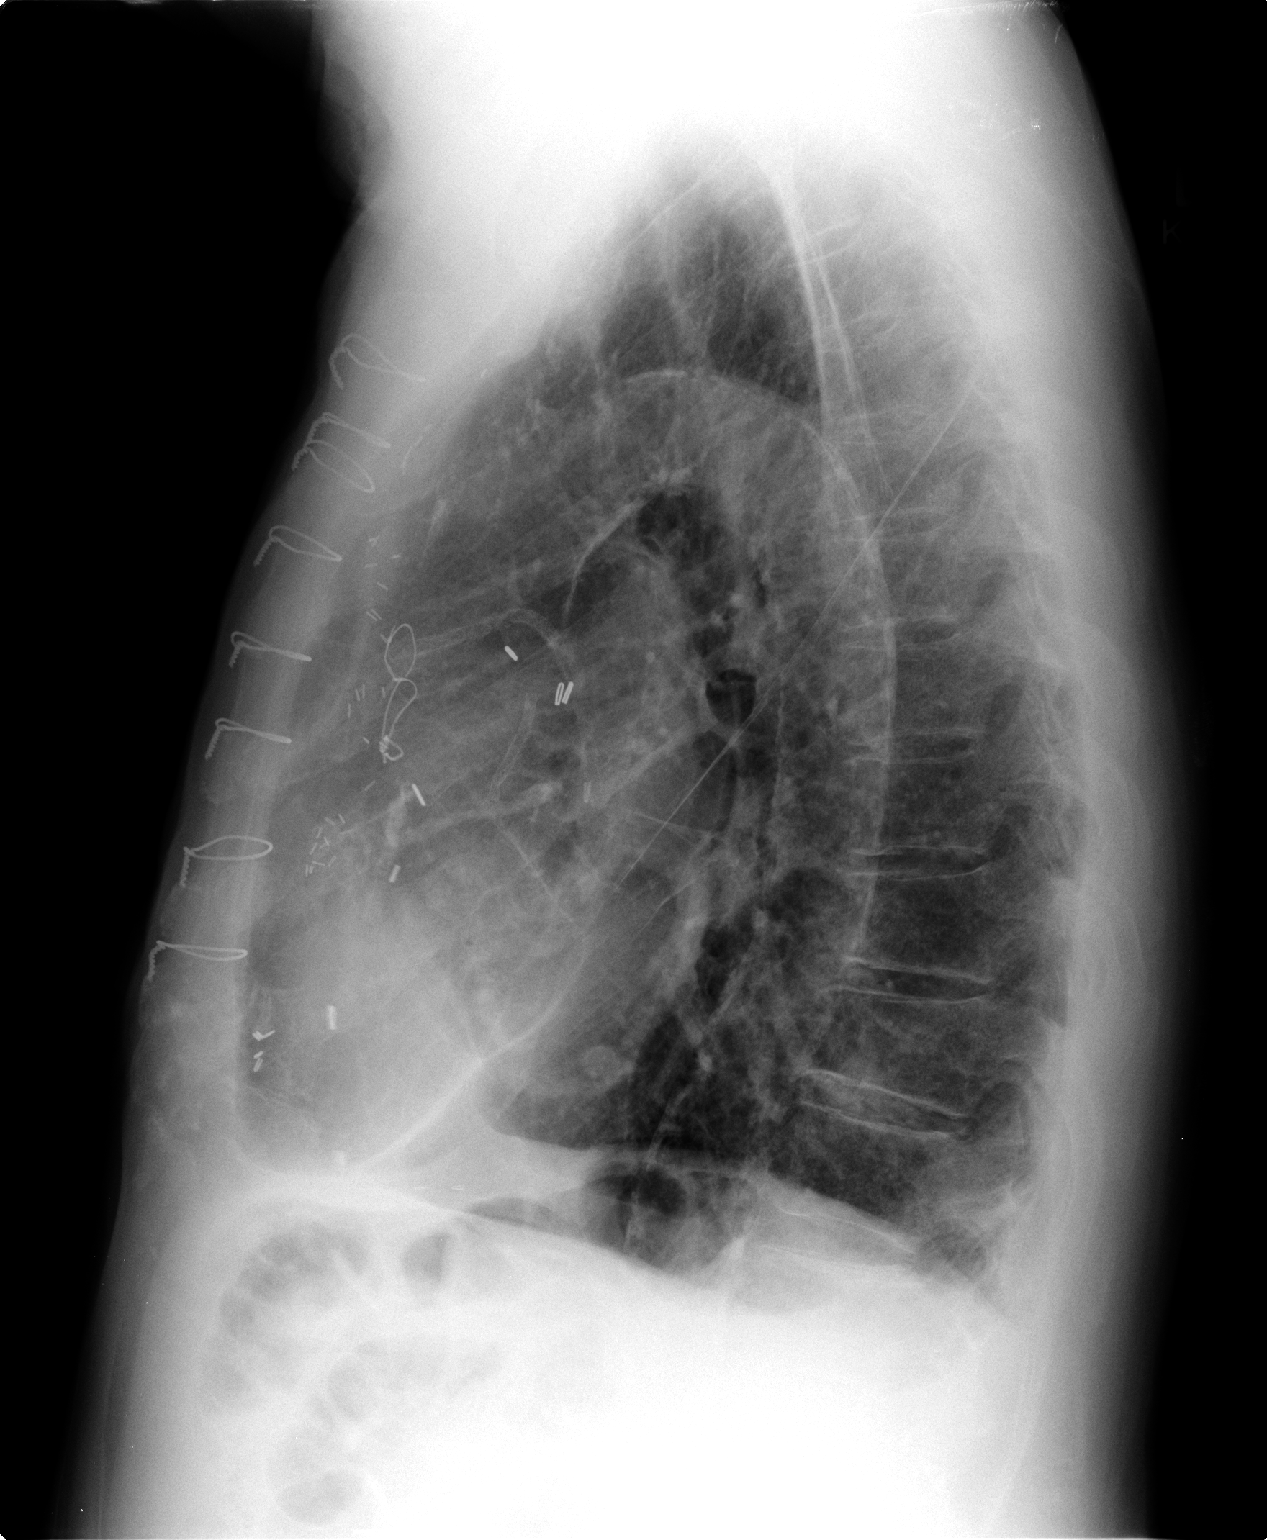

[2 of 2 positions shown; findings below may reference images not displayed]

FINDINGS: There is hyperinflation of the lungs compatible with
COPD.  Areas of scarring in the lungs bilaterally, most pronounced
in the right base but also seen in the left base and apices.  Prior
CABG.  Heart is normal size.  Calcified aorta which is
nonaneurysmal.  No acute opacities or effusions.  No acute bony
abnormality.
IMPRESSION: Stable COPD/chronic changes.  No active disease.

## 2013-03-21 ENCOUNTER — Ambulatory Visit: Payer: Medicare Other | Admitting: Internal Medicine

## 2013-05-14 ENCOUNTER — Other Ambulatory Visit: Payer: Self-pay | Admitting: Internal Medicine

## 2013-05-29 ENCOUNTER — Encounter: Payer: Self-pay | Admitting: Family

## 2013-05-30 ENCOUNTER — Ambulatory Visit (INDEPENDENT_AMBULATORY_CARE_PROVIDER_SITE_OTHER)
Admission: RE | Admit: 2013-05-30 | Discharge: 2013-05-30 | Disposition: A | Discharge status: Home / Self Care | Payer: Medicare Other | Source: Ambulatory Visit | Attending: Neurosurgery | Admitting: Neurosurgery

## 2013-05-30 ENCOUNTER — Ambulatory Visit: Payer: Medicare Other | Admitting: Family

## 2013-05-30 ENCOUNTER — Ambulatory Visit (HOSPITAL_COMMUNITY)
Admission: RE | Admit: 2013-05-30 | Discharge: 2013-05-30 | Disposition: A | Discharge status: Home / Self Care | Payer: Medicare Other | Source: Ambulatory Visit | Attending: Family | Admitting: Family

## 2013-05-30 DIAGNOSIS — I739 Peripheral vascular disease, unspecified: Secondary | ICD-10-CM

## 2013-05-30 DIAGNOSIS — Z48812 Encounter for surgical aftercare following surgery on the circulatory system: Secondary | ICD-10-CM

## 2013-05-30 NOTE — Progress Notes (Signed)
This encounter was created in error - please disregard.

## 2013-06-02 ENCOUNTER — Other Ambulatory Visit: Payer: Self-pay | Admitting: Cardiovascular Disease

## 2013-06-04 ENCOUNTER — Ambulatory Visit: Payer: Medicare Other | Admitting: Neurosurgery

## 2013-06-10 ENCOUNTER — Ambulatory Visit (INDEPENDENT_AMBULATORY_CARE_PROVIDER_SITE_OTHER): Payer: Medicare Other | Admitting: Internal Medicine

## 2013-06-10 ENCOUNTER — Encounter: Payer: Self-pay | Admitting: Internal Medicine

## 2013-06-10 DIAGNOSIS — R443 Hallucinations, unspecified: Secondary | ICD-10-CM

## 2013-06-10 DIAGNOSIS — I10 Essential (primary) hypertension: Secondary | ICD-10-CM

## 2013-06-10 DIAGNOSIS — E7800 Pure hypercholesterolemia, unspecified: Secondary | ICD-10-CM

## 2013-06-10 MED ORDER — RISPERIDONE 0.5 MG PO TABS: 0.5000 mg | ORAL_TABLET | Freq: Two times a day (BID) | ORAL | Status: DC

## 2013-06-10 NOTE — Patient Instructions (Signed)
Please take all new medication as prescribed Please continue all other medications as before, and refills have been done if requested.  Please go to the LAB in the Basement (turn left off the elevator) for the tests to be done  You will be contacted by phone if any changes need to be made immediately.  Otherwise, you will receive a letter about your results with an explanation, but please check with MyChart first.  Please return in 3 months, or sooner if needed

## 2013-06-10 NOTE — Assessment & Plan Note (Signed)
Pt due for lab, has known gradually worsening vasculpathies, asked family to ask pt to have these labs done soon

## 2013-06-10 NOTE — Progress Notes (Signed)
Subjective:    [name], male    DOB: 11/08/1934, 78 y.o.   MRN: [id number]  HPI  Pt not here as he declined to come as apparently developed some GI distress/? Viral illness going around the family;  Family concerned (wife and granddaughter) here to state pt has been having visual and auditory hallucination only at night for 2 wks, seeing a bird in one corner of the room that no one else can see;  Has ongoing depression, family thinks with zoloft may be some improved, no SI, HI or agitation. Has signficant trouble getting to sleep, and the bird in the room upsets him, though will not sleep anywhere else as he remains to sleep in his hosp bed in his room.   Pt denies fever, wt loss, night sweats, loss of appetite, or other constitutional symptoms. No recent falls or head trauma, Past Medical History  Diagnosis Date  . CEREBROVASCULAR ACCIDENT, HX OF 03/02/2007  . CHEST PAIN 01/31/2010  . CHRONIC OBSTRUCTIVE PULMONARY DISEASE, ACUTE EXACERBATION 02/18/2009  . COPD 03/02/2007  . CORONARY ARTERY DISEASE 03/02/2007  . DEPRESSION 09/23/2007    anxiety  . DISEASE, PERIPHERAL VASCULAR NEC 03/02/2007  . DIVERTICULOSIS, COLON 09/23/2007  . FATIGUE 01/22/2009  . GERD 03/02/2007  . GLUCOSE INTOLERANCE 09/23/2007  . HEMORRHOIDS 10/09/2007  . HIATAL HERNIA 10/09/2007  . HYPERCHOLESTEROLEMIA 10/09/2007  . HYPERTENSION 09/23/2007  . HYPOKALEMIA 10/09/2007  . MESENTERIC VASCULAR INSUFFICIENCY 07/06/2008  . MITRAL REGURGITATION 11/27/2008  . MI 10/09/2007  . Other specified forms of hearing loss 08/23/2010  . PERIPHERAL VASCULAR DISEASE 09/23/2007  . PUD 10/09/2007  . RENAL ARTERY STENOSIS 03/02/2007  . TOBACCO ABUSE 04/20/2010  . 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 Unspecified Iron Deficiency Anemia 09/23/2007  . URINARY RETENTION 01/22/2009  . VITAMIN B12 DEFICIENCY 01/03/2010  . WRIST PAIN, RIGHT 06/06/2010  . Impaired glucose tolerance 01/29/2011  . Hearing loss in left ear 01/30/2011  . Blindness  of both eyes 01/30/2011  . Hyperlipidemia   . Anemia, iron deficiency   . Atrial flutter   . HTN (hypertension) 03/21/2011  . Peripheral arterial disease    Past Surgical History  Procedure Laterality Date  . Coronary artery bypass graft  1995    LIMA-LAD, SVG-OM, SVG-D, SVG-PDA  . Renal artery stenting  2004    bilateral  . S/p multiple le vascular bypass      includine fem-pop x 2 LLE  . S/p bilateral cea    . Coronary angioplasty with stent placement  2010    DES x 2 SVG-OM, cutting balloon PTCA SVG-PDA for ISR  . Appendectomy    . S/p lumbar disc surgury      x2  . S/p rle fem bypass feb 2011    . Cardiac catheterization    . Pr vein bypass graft,aorto-fem-pop    . Carotid endarterectomy    . Coronary angioplasty with stent placement  2011    DES to SVG-DIAG, SVG-PDA  . Esophagogastroduodenoscopy    . Colonoscopy      reports that he has been smoking Cigarettes.  He has a 60 pack-year smoking history. He has never used smokeless tobacco. He reports that he does not drink alcohol or use illicit drugs. family history includes Cancer in his father; Colon cancer (age of onset: 35) in his mother; Heart disease in his father and sister; Prostate cancer in his brother; Stroke in his sister. No Known Allergies Current Outpatient Prescriptions on File Prior  to Visit  Medication Sig Dispense Refill  . amLODipine (NORVASC) 10 MG tablet Take 10 mg by mouth daily.      Marland Kitchen amLODipine (NORVASC) 10 MG tablet TAKE 1 TABLET (10 MG TOTAL) BY MOUTH DAILY.  30 tablet  11  . aspirin 81 MG EC tablet Take 81 mg by mouth daily.        Marland Kitchen atorvastatin (LIPITOR) 40 MG tablet Take 40 mg by mouth daily.      . cilostazol (PLETAL) 100 MG tablet Take 100 mg by mouth 2 (two) times daily.      . clopidogrel (PLAVIX) 75 MG tablet Take 75 mg by mouth daily.      . clopidogrel (PLAVIX) 75 MG tablet TAKE 1 TABLET BY MOUTH ONCE DAILY  30 tablet  3  . esomeprazole (NEXIUM) 40 MG capsule Take 1 capsule (40 mg  total) by mouth daily.  90 capsule  3  . ferrous sulfate 325 (65 FE) MG tablet Take 325 mg by mouth 3 (three) times daily.        . fexofenadine (ALLEGRA) 180 MG tablet Take 180 mg by mouth daily.      . isosorbide mononitrate (IMDUR) 60 MG 24 hr tablet Take 60 mg by mouth daily.      Marland Kitchen lisinopril (PRINIVIL,ZESTRIL) 10 MG tablet Take 10 mg by mouth daily.      Marland Kitchen lisinopril (PRINIVIL,ZESTRIL) 10 MG tablet TAKE 1 TABLET BY MOUTH EVERY DAY  30 tablet  0  . metoprolol (LOPRESSOR) 100 MG tablet Take 1 tablet (100 mg total) by mouth 2 (two) times daily.  60 tablet  6  . nitroGLYCERIN (NITROSTAT) 0.4 MG SL tablet Place 1 tablet (0.4 mg total) under the tongue every 5 (five) minutes as needed for chest pain.  30 tablet  0  . ranitidine (ZANTAC) 150 MG tablet Take 1 tablet (150 mg total) by mouth at bedtime.  90 tablet  3  . sertraline (ZOLOFT) 100 MG tablet Take 1 tablet (100 mg total) by mouth daily.  90 tablet  3  . traMADol (ULTRAM) 50 MG tablet Take 1 tablet (50 mg total) by mouth every 8 (eight) hours as needed for pain.  60 tablet  0   No current facility-administered medications on file prior to visit.   Review of Systems Pt not here - not able    Objective:   Physical Exam Pt not here - not able     Assessment & Plan:

## 2013-06-10 NOTE — Assessment & Plan Note (Signed)
stable overall by history and exam, recent data reviewed with pt, and pt to continue medical treatment as before,  to f/u any worsening symptoms or concerns BP Readings from Last 3 Encounters:  01/08/13 148/50  01/03/13 120/80  12/25/12 168/76

## 2013-06-10 NOTE — Assessment & Plan Note (Signed)
With sleep difficulty, d/w wife and family, for risperdal .5 qhs prn,  to f/u any worsening symptoms or concerns

## 2013-06-13 ENCOUNTER — Other Ambulatory Visit: Payer: Self-pay | Admitting: Cardiovascular Disease

## 2013-06-16 ENCOUNTER — Inpatient Hospital Stay (HOSPITAL_COMMUNITY)
Admission: EM | Admit: 2013-06-16 | Discharge: 2013-06-28 | DRG: 682 | Disposition: A | Discharge status: Home / Self Care | Payer: Medicare Other | Attending: Internal Medicine | Admitting: Internal Medicine

## 2013-06-16 ENCOUNTER — Telehealth: Payer: Self-pay | Admitting: *Deleted

## 2013-06-16 ENCOUNTER — Encounter (HOSPITAL_COMMUNITY): Payer: Self-pay | Admitting: Emergency Medicine

## 2013-06-16 DIAGNOSIS — E876 Hypokalemia: Secondary | ICD-10-CM | POA: Diagnosis present

## 2013-06-16 DIAGNOSIS — J4489 Other specified chronic obstructive pulmonary disease: Secondary | ICD-10-CM

## 2013-06-16 DIAGNOSIS — N170 Acute kidney failure with tubular necrosis: Principal | ICD-10-CM | POA: Diagnosis present

## 2013-06-16 DIAGNOSIS — K5730 Diverticulosis of large intestine without perforation or abscess without bleeding: Secondary | ICD-10-CM

## 2013-06-16 DIAGNOSIS — F3289 Other specified depressive episodes: Secondary | ICD-10-CM

## 2013-06-16 DIAGNOSIS — J9601 Acute respiratory failure with hypoxia: Secondary | ICD-10-CM | POA: Diagnosis present

## 2013-06-16 DIAGNOSIS — Z418 Encounter for other procedures for purposes other than remedying health state: Secondary | ICD-10-CM

## 2013-06-16 DIAGNOSIS — Z9089 Acquired absence of other organs: Secondary | ICD-10-CM

## 2013-06-16 DIAGNOSIS — E860 Dehydration: Secondary | ICD-10-CM

## 2013-06-16 DIAGNOSIS — F411 Generalized anxiety disorder: Secondary | ICD-10-CM

## 2013-06-16 DIAGNOSIS — I7389 Other specified peripheral vascular diseases: Secondary | ICD-10-CM

## 2013-06-16 DIAGNOSIS — I5022 Chronic systolic (congestive) heart failure: Secondary | ICD-10-CM

## 2013-06-16 DIAGNOSIS — I509 Heart failure, unspecified: Secondary | ICD-10-CM | POA: Diagnosis present

## 2013-06-16 DIAGNOSIS — K279 Peptic ulcer, site unspecified, unspecified as acute or chronic, without hemorrhage or perforation: Secondary | ICD-10-CM

## 2013-06-16 DIAGNOSIS — R079 Chest pain, unspecified: Secondary | ICD-10-CM

## 2013-06-16 DIAGNOSIS — E41 Nutritional marasmus: Secondary | ICD-10-CM | POA: Diagnosis present

## 2013-06-16 DIAGNOSIS — Z8249 Family history of ischemic heart disease and other diseases of the circulatory system: Secondary | ICD-10-CM

## 2013-06-16 DIAGNOSIS — E872 Acidosis, unspecified: Secondary | ICD-10-CM

## 2013-06-16 DIAGNOSIS — Z860100 Personal history of colon polyps, unspecified: Secondary | ICD-10-CM

## 2013-06-16 DIAGNOSIS — F329 Major depressive disorder, single episode, unspecified: Secondary | ICD-10-CM | POA: Diagnosis present

## 2013-06-16 DIAGNOSIS — Z8673 Personal history of transient ischemic attack (TIA), and cerebral infarction without residual deficits: Secondary | ICD-10-CM

## 2013-06-16 DIAGNOSIS — Z0000 Encounter for general adult medical examination without abnormal findings: Secondary | ICD-10-CM

## 2013-06-16 DIAGNOSIS — J9600 Acute respiratory failure, unspecified whether with hypoxia or hypercapnia: Secondary | ICD-10-CM | POA: Diagnosis not present

## 2013-06-16 DIAGNOSIS — H9190 Unspecified hearing loss, unspecified ear: Secondary | ICD-10-CM | POA: Diagnosis present

## 2013-06-16 DIAGNOSIS — I080 Rheumatic disorders of both mitral and aortic valves: Secondary | ICD-10-CM

## 2013-06-16 DIAGNOSIS — I472 Ventricular tachycardia, unspecified: Secondary | ICD-10-CM

## 2013-06-16 DIAGNOSIS — K551 Chronic vascular disorders of intestine: Secondary | ICD-10-CM

## 2013-06-16 DIAGNOSIS — K7200 Acute and subacute hepatic failure without coma: Secondary | ICD-10-CM | POA: Diagnosis present

## 2013-06-16 DIAGNOSIS — H6990 Unspecified Eustachian tube disorder, unspecified ear: Secondary | ICD-10-CM

## 2013-06-16 DIAGNOSIS — I4892 Unspecified atrial flutter: Secondary | ICD-10-CM | POA: Diagnosis present

## 2013-06-16 DIAGNOSIS — H6980 Other specified disorders of Eustachian tube, unspecified ear: Secondary | ICD-10-CM

## 2013-06-16 DIAGNOSIS — I5043 Acute on chronic combined systolic (congestive) and diastolic (congestive) heart failure: Secondary | ICD-10-CM

## 2013-06-16 DIAGNOSIS — Z2989 Encounter for other specified prophylactic measures: Secondary | ICD-10-CM

## 2013-06-16 DIAGNOSIS — M6282 Rhabdomyolysis: Secondary | ICD-10-CM | POA: Diagnosis present

## 2013-06-16 DIAGNOSIS — J309 Allergic rhinitis, unspecified: Secondary | ICD-10-CM

## 2013-06-16 DIAGNOSIS — H9192 Unspecified hearing loss, left ear: Secondary | ICD-10-CM

## 2013-06-16 DIAGNOSIS — K649 Unspecified hemorrhoids: Secondary | ICD-10-CM

## 2013-06-16 DIAGNOSIS — R443 Hallucinations, unspecified: Secondary | ICD-10-CM

## 2013-06-16 DIAGNOSIS — Z951 Presence of aortocoronary bypass graft: Secondary | ICD-10-CM

## 2013-06-16 DIAGNOSIS — H543 Unqualified visual loss, both eyes: Secondary | ICD-10-CM

## 2013-06-16 DIAGNOSIS — G629 Polyneuropathy, unspecified: Secondary | ICD-10-CM

## 2013-06-16 DIAGNOSIS — Z8679 Personal history of other diseases of the circulatory system: Secondary | ICD-10-CM

## 2013-06-16 DIAGNOSIS — I252 Old myocardial infarction: Secondary | ICD-10-CM

## 2013-06-16 DIAGNOSIS — I739 Peripheral vascular disease, unspecified: Secondary | ICD-10-CM

## 2013-06-16 DIAGNOSIS — I428 Other cardiomyopathies: Secondary | ICD-10-CM | POA: Diagnosis present

## 2013-06-16 DIAGNOSIS — R7302 Impaired glucose tolerance (oral): Secondary | ICD-10-CM

## 2013-06-16 DIAGNOSIS — I469 Cardiac arrest, cause unspecified: Secondary | ICD-10-CM

## 2013-06-16 DIAGNOSIS — F17200 Nicotine dependence, unspecified, uncomplicated: Secondary | ICD-10-CM

## 2013-06-16 DIAGNOSIS — I701 Atherosclerosis of renal artery: Secondary | ICD-10-CM

## 2013-06-16 DIAGNOSIS — K219 Gastro-esophageal reflux disease without esophagitis: Secondary | ICD-10-CM

## 2013-06-16 DIAGNOSIS — I4901 Ventricular fibrillation: Secondary | ICD-10-CM | POA: Diagnosis not present

## 2013-06-16 DIAGNOSIS — H918X9 Other specified hearing loss, unspecified ear: Secondary | ICD-10-CM

## 2013-06-16 DIAGNOSIS — E7800 Pure hypercholesterolemia, unspecified: Secondary | ICD-10-CM

## 2013-06-16 DIAGNOSIS — I4729 Other ventricular tachycardia: Secondary | ICD-10-CM | POA: Diagnosis not present

## 2013-06-16 DIAGNOSIS — I2510 Atherosclerotic heart disease of native coronary artery without angina pectoris: Secondary | ICD-10-CM

## 2013-06-16 DIAGNOSIS — Z48812 Encounter for surgical aftercare following surgery on the circulatory system: Secondary | ICD-10-CM

## 2013-06-16 DIAGNOSIS — K449 Diaphragmatic hernia without obstruction or gangrene: Secondary | ICD-10-CM

## 2013-06-16 DIAGNOSIS — I70209 Unspecified atherosclerosis of native arteries of extremities, unspecified extremity: Secondary | ICD-10-CM | POA: Diagnosis present

## 2013-06-16 DIAGNOSIS — Z800 Family history of malignant neoplasm of digestive organs: Secondary | ICD-10-CM

## 2013-06-16 DIAGNOSIS — Z7982 Long term (current) use of aspirin: Secondary | ICD-10-CM

## 2013-06-16 DIAGNOSIS — Z9861 Coronary angioplasty status: Secondary | ICD-10-CM

## 2013-06-16 DIAGNOSIS — I501 Left ventricular failure: Secondary | ICD-10-CM

## 2013-06-16 DIAGNOSIS — R7309 Other abnormal glucose: Secondary | ICD-10-CM | POA: Diagnosis not present

## 2013-06-16 DIAGNOSIS — Z8042 Family history of malignant neoplasm of prostate: Secondary | ICD-10-CM

## 2013-06-16 DIAGNOSIS — I5020 Unspecified systolic (congestive) heart failure: Secondary | ICD-10-CM

## 2013-06-16 DIAGNOSIS — I4510 Unspecified right bundle-branch block: Secondary | ICD-10-CM | POA: Diagnosis present

## 2013-06-16 DIAGNOSIS — I10 Essential (primary) hypertension: Secondary | ICD-10-CM

## 2013-06-16 DIAGNOSIS — N320 Bladder-neck obstruction: Secondary | ICD-10-CM

## 2013-06-16 DIAGNOSIS — E875 Hyperkalemia: Secondary | ICD-10-CM | POA: Diagnosis not present

## 2013-06-16 DIAGNOSIS — G9349 Other encephalopathy: Secondary | ICD-10-CM | POA: Diagnosis not present

## 2013-06-16 DIAGNOSIS — E8729 Other acidosis: Secondary | ICD-10-CM

## 2013-06-16 DIAGNOSIS — M542 Cervicalgia: Secondary | ICD-10-CM

## 2013-06-16 DIAGNOSIS — D509 Iron deficiency anemia, unspecified: Secondary | ICD-10-CM

## 2013-06-16 DIAGNOSIS — I4891 Unspecified atrial fibrillation: Secondary | ICD-10-CM

## 2013-06-16 DIAGNOSIS — J449 Chronic obstructive pulmonary disease, unspecified: Secondary | ICD-10-CM

## 2013-06-16 DIAGNOSIS — Z823 Family history of stroke: Secondary | ICD-10-CM

## 2013-06-16 DIAGNOSIS — E538 Deficiency of other specified B group vitamins: Secondary | ICD-10-CM

## 2013-06-16 DIAGNOSIS — I498 Other specified cardiac arrhythmias: Secondary | ICD-10-CM | POA: Diagnosis not present

## 2013-06-16 DIAGNOSIS — R197 Diarrhea, unspecified: Secondary | ICD-10-CM

## 2013-06-16 DIAGNOSIS — R7401 Elevation of levels of liver transaminase levels: Secondary | ICD-10-CM

## 2013-06-16 DIAGNOSIS — A0811 Acute gastroenteropathy due to Norwalk agent: Secondary | ICD-10-CM

## 2013-06-16 DIAGNOSIS — N179 Acute kidney failure, unspecified: Secondary | ICD-10-CM

## 2013-06-16 DIAGNOSIS — E785 Hyperlipidemia, unspecified: Secondary | ICD-10-CM | POA: Diagnosis present

## 2013-06-16 DIAGNOSIS — M25539 Pain in unspecified wrist: Secondary | ICD-10-CM

## 2013-06-16 DIAGNOSIS — Z79899 Other long term (current) drug therapy: Secondary | ICD-10-CM

## 2013-06-16 DIAGNOSIS — I219 Acute myocardial infarction, unspecified: Secondary | ICD-10-CM

## 2013-06-16 DIAGNOSIS — R630 Anorexia: Secondary | ICD-10-CM | POA: Diagnosis present

## 2013-06-16 DIAGNOSIS — J8410 Pulmonary fibrosis, unspecified: Secondary | ICD-10-CM | POA: Diagnosis present

## 2013-06-16 DIAGNOSIS — Z7902 Long term (current) use of antithrombotics/antiplatelets: Secondary | ICD-10-CM

## 2013-06-16 DIAGNOSIS — I959 Hypotension, unspecified: Secondary | ICD-10-CM

## 2013-06-16 NOTE — Telephone Encounter (Signed)
[facility] Triage Call Report Triage Record Num: [id number] Operator: April Gaither Patient Name: [name] Call Date & Time: 06/11/2013 9:21:56PM Patient Phone: PCP: Patient Gender: Male PCP Fax : Patient DOB: 14-May-1935 Practice Name:  [facility] Reason for Call: Caller: [name]/Other; PCP: [name] (Adults only); CB#: [phone]; Call regarding Med ?.; Daugher calling to see if side effect of Risperidone is weakness and fatigue; Per manufacturer website, both are listed as common side effects; Daughter concerned due to patient has had 3 days of GI virus and had EMS staff come out to evaluate today; Vitals were normal, but EMS told daughter that medication may be causing sxs; Daughter would like to follow up with [name] prior to patient taking anymore Risperidone; Per manufacturer website, no harm was noted in missing a dose. Daughter to follow up with [name] in AM. Protocol(s) Used: Office Note Recommended Outcome per Protocol: Information Noted and Sent to Office Reason for Outcome: Caller information to office Care Advice: ~ 10/c

## 2013-06-16 NOTE — ED Notes (Signed)
Per EMS, patient has had 'GI bug' x1 week. Family states that today he is more weak and confused than before. EMS reports that patient had no acute confusion with them en route to facility. Pt was slightly hypotensive at 100/44. Patient has had diarrhea x1week with no nausea or vomiting. CBG 128. HR 70.

## 2013-06-17 ENCOUNTER — Inpatient Hospital Stay (HOSPITAL_COMMUNITY): Payer: Medicare Other

## 2013-06-17 ENCOUNTER — Encounter (HOSPITAL_COMMUNITY): Payer: Self-pay | Admitting: Internal Medicine

## 2013-06-17 ENCOUNTER — Ambulatory Visit: Payer: Medicare Other | Admitting: Internal Medicine

## 2013-06-17 DIAGNOSIS — E860 Dehydration: Secondary | ICD-10-CM

## 2013-06-17 DIAGNOSIS — M6282 Rhabdomyolysis: Secondary | ICD-10-CM | POA: Diagnosis present

## 2013-06-17 DIAGNOSIS — R197 Diarrhea, unspecified: Secondary | ICD-10-CM

## 2013-06-17 DIAGNOSIS — E872 Acidosis: Secondary | ICD-10-CM | POA: Diagnosis present

## 2013-06-17 DIAGNOSIS — I959 Hypotension, unspecified: Secondary | ICD-10-CM | POA: Diagnosis present

## 2013-06-17 DIAGNOSIS — N179 Acute kidney failure, unspecified: Secondary | ICD-10-CM | POA: Diagnosis present

## 2013-06-17 LAB — COMPREHENSIVE METABOLIC PANEL
ALT: 8 U/L (ref 0–53)
ALT: 7 U/L (ref 0–53)
AST: 12 U/L (ref 0–37)
Albumin: 2.5 g/dL — ABNORMAL LOW (ref 3.5–5.2)
Alkaline Phosphatase: 156 U/L — ABNORMAL HIGH (ref 39–117)
CO2: 12 mEq/L — ABNORMAL LOW (ref 19–32)
CO2: 11 mEq/L — ABNORMAL LOW (ref 19–32)
Calcium: 9 mg/dL (ref 8.4–10.5)
Calcium: 8.5 mg/dL (ref 8.4–10.5)
Chloride: 104 mEq/L (ref 96–112)
Creatinine, Ser: 4.06 mg/dL — ABNORMAL HIGH (ref 0.50–1.35)
GFR calc Af Amer: 15 mL/min — ABNORMAL LOW (ref >90)
GFR calc non Af Amer: 13 mL/min — ABNORMAL LOW (ref >90)
GFR calc non Af Amer: 17 mL/min — ABNORMAL LOW (ref >90)
Glucose, Bld: 104 mg/dL — ABNORMAL HIGH (ref 70–99)
Sodium: 126 mEq/L — ABNORMAL LOW (ref 135–145)
Sodium: 131 mEq/L — ABNORMAL LOW (ref 135–145)
Total Protein: 6.8 g/dL (ref 6.0–8.3)

## 2013-06-17 LAB — CBC WITH DIFFERENTIAL/PLATELET
Basophils Absolute: 0 10*3/uL (ref 0.0–0.1)
Eosinophils Relative: 0 % (ref 0–5)
HCT: 34.3 % — ABNORMAL LOW (ref 39.0–52.0)
Hemoglobin: 12.1 g/dL — ABNORMAL LOW (ref 13.0–17.0)
Lymphocytes Relative: 7 % — ABNORMAL LOW (ref 12–46)
Lymphocytes Relative: 8 % — ABNORMAL LOW (ref 12–46)
Lymphs Abs: 1.2 10*3/uL (ref 0.7–4.0)
Lymphs Abs: 1.3 10*3/uL (ref 0.7–4.0)
MCH: 31.1 pg (ref 26.0–34.0)
MCHC: 35.3 g/dL (ref 30.0–36.0)
MCV: 87.9 fL (ref 78.0–100.0)
Monocytes Absolute: 1.6 10*3/uL — ABNORMAL HIGH (ref 0.1–1.0)
Monocytes Relative: 10 % (ref 3–12)
Monocytes Relative: 9 % (ref 3–12)
Neutro Abs: 12.8 10*3/uL — ABNORMAL HIGH (ref 1.7–7.7)
Neutro Abs: 15.4 10*3/uL — ABNORMAL HIGH (ref 1.7–7.7)
Neutrophils Relative %: 81 % — ABNORMAL HIGH (ref 43–77)
Platelets: 236 10*3/uL (ref 150–400)
RBC: 3.89 MIL/uL — ABNORMAL LOW (ref 4.22–5.81)
RBC: 3.94 MIL/uL — ABNORMAL LOW (ref 4.22–5.81)
WBC: 18.4 10*3/uL — ABNORMAL HIGH (ref 4.0–10.5)

## 2013-06-17 LAB — POCT I-STAT 3, ART BLOOD GAS (G3+)
Acid-base deficit: 15 mmol/L — ABNORMAL HIGH (ref 0.0–2.0)
Bicarbonate: 10.9 mEq/L — ABNORMAL LOW (ref 20.0–24.0)
Patient temperature: 98.6
TCO2: 12 mmol/L (ref 0–100)
pO2, Arterial: 80 mmHg (ref 80.0–100.0)

## 2013-06-17 LAB — GI PATHOGEN PANEL BY PCR, STOOL
C difficile toxin A/B: NEGATIVE
Campylobacter by PCR: NEGATIVE
E coli (ETEC) LT/ST: NEGATIVE
E coli (STEC): NEGATIVE
Norovirus GI/GII: POSITIVE
Shigella by PCR: NEGATIVE

## 2013-06-17 LAB — TROPONIN I
Troponin I: <0.3 ng/mL (ref <0.30)
Troponin I: <0.3 ng/mL (ref <0.30)
Troponin I: <0.3 ng/mL (ref <0.30)
Troponin I: 0.34 ng/mL (ref <0.30)

## 2013-06-17 LAB — URINALYSIS, ROUTINE W REFLEX MICROSCOPIC
Bilirubin Urine: NEGATIVE
Glucose, UA: NEGATIVE mg/dL
Glucose, UA: NEGATIVE mg/dL
Hgb urine dipstick: NEGATIVE
Hgb urine dipstick: NEGATIVE
Ketones, ur: NEGATIVE mg/dL
Leukocytes, UA: NEGATIVE
Protein, ur: NEGATIVE mg/dL
Specific Gravity, Urine: 1.012 (ref 1.005–1.030)
pH: 5 (ref 5.0–8.0)
pH: 5 (ref 5.0–8.0)

## 2013-06-17 LAB — MRSA PCR SCREENING: MRSA by PCR: NEGATIVE

## 2013-06-17 LAB — AMMONIA: Ammonia: 22 umol/L (ref 11–60)

## 2013-06-17 LAB — CREATININE, URINE, RANDOM: Creatinine, Urine: 69.93 mg/dL

## 2013-06-17 LAB — CG4 I-STAT (LACTIC ACID): Lactic Acid, Venous: 0.77 mmol/L (ref 0.5–2.2)

## 2013-06-17 LAB — CLOSTRIDIUM DIFFICILE BY PCR: Toxigenic C. Difficile by PCR: NEGATIVE

## 2013-06-17 LAB — SODIUM, URINE, RANDOM: Sodium, Ur: 41 mEq/L

## 2013-06-17 MED ORDER — CLOPIDOGREL BISULFATE 75 MG PO TABS
75.0000 mg | ORAL_TABLET | Freq: Every day | ORAL | Status: DC
  Administered 2013-06-17 – 2013-06-28 (×12): 75 mg via ORAL
  Filled 2013-06-17 (×16): qty 1

## 2013-06-17 MED ORDER — SODIUM CHLORIDE 0.9 % IV SOLN
1000.0000 mL | Freq: Once | INTRAVENOUS | Status: AC
  Administered 2013-06-17: 1000 mL via INTRAVENOUS

## 2013-06-17 MED ORDER — SODIUM CHLORIDE 0.9 % IV SOLN
INTRAVENOUS | Status: DC
  Administered 2013-06-17: 05:00:00 via INTRAVENOUS

## 2013-06-17 MED ORDER — ASPIRIN 81 MG PO TBEC
81.0000 mg | DELAYED_RELEASE_TABLET | Freq: Every day | ORAL | Status: DC
  Administered 2013-06-17 – 2013-06-28 (×12): 81 mg via ORAL
  Filled 2013-06-17 (×12): qty 1

## 2013-06-17 MED ORDER — SODIUM BICARBONATE 8.4 % IV SOLN
INTRAVENOUS | Status: DC
  Administered 2013-06-17 – 2013-06-18 (×3): via INTRAVENOUS
  Filled 2013-06-17 (×7): qty 150

## 2013-06-17 MED ORDER — CILOSTAZOL 100 MG PO TABS
100.0000 mg | ORAL_TABLET | Freq: Two times a day (BID) | ORAL | Status: DC
  Administered 2013-06-17 – 2013-06-28 (×23): 100 mg via ORAL
  Filled 2013-06-17 (×24): qty 1

## 2013-06-17 MED ORDER — FERROUS SULFATE 325 (65 FE) MG PO TABS
325.0000 mg | ORAL_TABLET | Freq: Three times a day (TID) | ORAL | Status: DC
  Administered 2013-06-17 – 2013-06-28 (×31): 325 mg via ORAL
  Filled 2013-06-17 (×38): qty 1

## 2013-06-17 MED ORDER — SODIUM CHLORIDE 0.9 % IV SOLN: 1000.0000 mL | INTRAVENOUS | Status: DC

## 2013-06-17 MED ORDER — CIPROFLOXACIN IN D5W 400 MG/200ML IV SOLN
400.0000 mg | INTRAVENOUS | Status: DC
  Administered 2013-06-17 – 2013-06-18 (×2): 400 mg via INTRAVENOUS
  Filled 2013-06-17 (×3): qty 200

## 2013-06-17 MED ORDER — ACETAMINOPHEN 650 MG RE SUPP: 650.0000 mg | Freq: Four times a day (QID) | RECTAL | Status: DC | PRN

## 2013-06-17 MED ORDER — LORATADINE 10 MG PO TABS
10.0000 mg | ORAL_TABLET | Freq: Every day | ORAL | Status: DC
  Administered 2013-06-17 – 2013-06-18 (×2): 10 mg via ORAL
  Filled 2013-06-17 (×3): qty 1

## 2013-06-17 MED ORDER — SODIUM CHLORIDE 0.9 % IV SOLN: 1000.0000 mL | Freq: Once | INTRAVENOUS | Status: DC

## 2013-06-17 MED ORDER — SODIUM CHLORIDE 0.9 % IV BOLUS (SEPSIS): 500.0000 mL | Freq: Once | INTRAVENOUS | Status: DC

## 2013-06-17 MED ORDER — IOHEXOL 300 MG/ML  SOLN: 25.0000 mL | INTRAMUSCULAR | Status: DC

## 2013-06-17 MED ORDER — ONDANSETRON HCL 4 MG PO TABS
4.0000 mg | ORAL_TABLET | Freq: Four times a day (QID) | ORAL | Status: DC | PRN
  Administered 2013-06-20 – 2013-06-26 (×2): 4 mg via ORAL
  Filled 2013-06-17 (×2): qty 1

## 2013-06-17 MED ORDER — PANTOPRAZOLE SODIUM 40 MG PO TBEC
40.0000 mg | DELAYED_RELEASE_TABLET | Freq: Every day | ORAL | Status: DC
  Administered 2013-06-17 – 2013-06-18 (×2): 40 mg via ORAL
  Filled 2013-06-17 (×2): qty 1

## 2013-06-17 MED ORDER — METOPROLOL TARTRATE 1 MG/ML IV SOLN
5.0000 mg | Freq: Four times a day (QID) | INTRAVENOUS | Status: DC
  Administered 2013-06-18 (×3): 5 mg via INTRAVENOUS
  Filled 2013-06-17 (×6): qty 5

## 2013-06-17 MED ORDER — RISPERIDONE 0.5 MG PO TABS
0.5000 mg | ORAL_TABLET | Freq: Two times a day (BID) | ORAL | Status: DC
  Administered 2013-06-17 – 2013-06-18 (×4): 0.5 mg via ORAL
  Filled 2013-06-17 (×8): qty 1

## 2013-06-17 MED ORDER — TRAMADOL HCL 50 MG PO TABS: 50.0000 mg | ORAL_TABLET | Freq: Three times a day (TID) | ORAL | Status: DC | PRN

## 2013-06-17 MED ORDER — METRONIDAZOLE IN NACL 5-0.79 MG/ML-% IV SOLN
500.0000 mg | Freq: Three times a day (TID) | INTRAVENOUS | Status: DC
  Administered 2013-06-17 – 2013-06-18 (×4): 500 mg via INTRAVENOUS
  Filled 2013-06-17 (×10): qty 100

## 2013-06-17 MED ORDER — SODIUM CHLORIDE 0.9 % IV SOLN
1000.0000 mL | INTRAVENOUS | Status: DC
  Administered 2013-06-17: 1000 mL via INTRAVENOUS

## 2013-06-17 MED ORDER — NITROGLYCERIN 0.4 MG SL SUBL: 0.4000 mg | SUBLINGUAL_TABLET | SUBLINGUAL | Status: DC | PRN

## 2013-06-17 MED ORDER — SERTRALINE HCL 100 MG PO TABS
100.0000 mg | ORAL_TABLET | Freq: Every day | ORAL | Status: DC
  Administered 2013-06-17 – 2013-06-18 (×2): 100 mg via ORAL
  Filled 2013-06-17 (×3): qty 1

## 2013-06-17 MED ORDER — ACETAMINOPHEN 325 MG PO TABS
650.0000 mg | ORAL_TABLET | Freq: Four times a day (QID) | ORAL | Status: DC | PRN
  Administered 2013-06-20 – 2013-06-26 (×3): 650 mg via ORAL
  Filled 2013-06-17 (×3): qty 2

## 2013-06-17 MED ORDER — ATORVASTATIN CALCIUM 40 MG PO TABS
40.0000 mg | ORAL_TABLET | Freq: Every day | ORAL | Status: DC
  Administered 2013-06-17 – 2013-06-18 (×2): 40 mg via ORAL
  Filled 2013-06-17 (×2): qty 1

## 2013-06-17 MED ORDER — ONDANSETRON HCL 4 MG/2ML IJ SOLN
4.0000 mg | Freq: Four times a day (QID) | INTRAMUSCULAR | Status: DC | PRN
  Administered 2013-06-17 – 2013-06-26 (×8): 4 mg via INTRAVENOUS
  Filled 2013-06-17 (×8): qty 2

## 2013-06-17 MED ORDER — METOPROLOL TARTRATE 100 MG PO TABS
100.0000 mg | ORAL_TABLET | Freq: Two times a day (BID) | ORAL | Status: DC
  Filled 2013-06-17 (×2): qty 1

## 2013-06-17 MED ORDER — FAMOTIDINE 20 MG PO TABS
20.0000 mg | ORAL_TABLET | Freq: Two times a day (BID) | ORAL | Status: DC
  Administered 2013-06-17 – 2013-06-18 (×3): 20 mg via ORAL
  Filled 2013-06-17 (×4): qty 1

## 2013-06-17 MED ORDER — SODIUM CHLORIDE 0.9 % IJ SOLN
3.0000 mL | Freq: Two times a day (BID) | INTRAMUSCULAR | Status: DC
  Administered 2013-06-17: 3 mL via INTRAVENOUS
  Administered 2013-06-17: 10 mL via INTRAVENOUS
  Administered 2013-06-18 – 2013-06-27 (×12): 3 mL via INTRAVENOUS

## 2013-06-17 NOTE — ED Provider Notes (Addendum)
CSN: [id number]     Arrival date & time 06/16/13  2336 History   First MD Initiated Contact with Patient 06/16/13 2350     Chief Complaint  Patient presents with  . Fatigue   (Consider location/radiation/quality/duration/timing/severity/associated sxs/prior Treatment) HPI 78 yo male presents to the ER from home via EMS with complaint of generalized weakness, fatigue.  He has had 1 week of diarrhea.  Family has had GI bug, n/v/d, but pt without vomiting.  Poor po intake.  No fevers, no sick contacts, no recent abx.  Family feels he is more confused.  Pt is awake, alert.  C/o fatigue.  Denies any chest pain, abd pain.   Past Medical History  Diagnosis Date  . CEREBROVASCULAR ACCIDENT, HX OF 03/02/2007  . CHEST PAIN 01/31/2010  . CHRONIC OBSTRUCTIVE PULMONARY DISEASE, ACUTE EXACERBATION 02/18/2009  . COPD 03/02/2007  . CORONARY ARTERY DISEASE 03/02/2007  . DEPRESSION 09/23/2007    anxiety  . DISEASE, PERIPHERAL VASCULAR NEC 03/02/2007  . DIVERTICULOSIS, COLON 09/23/2007  . FATIGUE 01/22/2009  . GERD 03/02/2007  . GLUCOSE INTOLERANCE 09/23/2007  . HEMORRHOIDS 10/09/2007  . HIATAL HERNIA 10/09/2007  . HYPERCHOLESTEROLEMIA 10/09/2007  . HYPERTENSION 09/23/2007  . HYPOKALEMIA 10/09/2007  . MESENTERIC VASCULAR INSUFFICIENCY 07/06/2008  . MITRAL REGURGITATION 11/27/2008  . MI 10/09/2007  . Other specified forms of hearing loss 08/23/2010  . PERIPHERAL VASCULAR DISEASE 09/23/2007  . PUD 10/09/2007  . RENAL ARTERY STENOSIS 03/02/2007  . TOBACCO ABUSE 04/20/2010  . 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 Unspecified Iron Deficiency Anemia 09/23/2007  . URINARY RETENTION 01/22/2009  . VITAMIN B12 DEFICIENCY 01/03/2010  . WRIST PAIN, RIGHT 06/06/2010  . Impaired glucose tolerance 01/29/2011  . Hearing loss in left ear 01/30/2011  . Blindness of both eyes 01/30/2011  . Hyperlipidemia   . Anemia, iron deficiency   . Atrial flutter   . HTN (hypertension) 03/21/2011  . Peripheral arterial disease    Past  Surgical History  Procedure Laterality Date  . Coronary artery bypass graft  1995    LIMA-LAD, SVG-OM, SVG-D, SVG-PDA  . Renal artery stenting  2004    bilateral  . S/p multiple le vascular bypass      includine fem-pop x 2 LLE  . S/p bilateral cea    . Coronary angioplasty with stent placement  2010    DES x 2 SVG-OM, cutting balloon PTCA SVG-PDA for ISR  . Appendectomy    . S/p lumbar disc surgury      x2  . S/p rle fem bypass feb 2011    . Cardiac catheterization    . Pr vein bypass graft,aorto-fem-pop    . Carotid endarterectomy    . Coronary angioplasty with stent placement  2011    DES to SVG-DIAG, SVG-PDA  . Esophagogastroduodenoscopy    . Colonoscopy     Family History  Problem Relation Age of Onset  . Colon cancer Mother 17  . Heart disease Father   . Cancer Father   . Heart disease Sister   . Prostate cancer Brother     prostate cancer  . Stroke Sister    History  Substance Use Topics  . Smoking status: Current Every Day Smoker -- 1.00 packs/day for 60 years    Types: Cigarettes  . Smokeless tobacco: Never Used     Comment: 1 ppd  . Alcohol Use: No    Review of Systems  All other systems reviewed and are negative.  other than listed in hpi  Allergies  Review of patient's allergies indicates no known allergies.  Home Medications   Current Outpatient Rx  Name  Route  Sig  Dispense  Refill  . amLODipine (NORVASC) 10 MG tablet   Oral   Take 10 mg by mouth daily.         Marland Kitchen aspirin 81 MG EC tablet   Oral   Take 81 mg by mouth daily.           Marland Kitchen atorvastatin (LIPITOR) 40 MG tablet   Oral   Take 40 mg by mouth daily.         . cilostazol (PLETAL) 100 MG tablet   Oral   Take 100 mg by mouth 2 (two) times daily.         . clopidogrel (PLAVIX) 75 MG tablet   Oral   Take 75 mg by mouth daily.         Marland Kitchen esomeprazole (NEXIUM) 40 MG capsule   Oral   Take 1 capsule (40 mg total) by mouth daily.   90 capsule   3   . ferrous sulfate  325 (65 FE) MG tablet   Oral   Take 325 mg by mouth 3 (three) times daily.           . fexofenadine (ALLEGRA) 180 MG tablet   Oral   Take 180 mg by mouth daily.         . isosorbide mononitrate (IMDUR) 60 MG 24 hr tablet   Oral   Take 60 mg by mouth daily.         Marland Kitchen lisinopril (PRINIVIL,ZESTRIL) 10 MG tablet   Oral   Take 10 mg by mouth daily.         . metoprolol (LOPRESSOR) 100 MG tablet   Oral   Take 1 tablet (100 mg total) by mouth 2 (two) times daily.   60 tablet   6   . nitroGLYCERIN (NITROSTAT) 0.4 MG SL tablet   Sublingual   Place 1 tablet (0.4 mg total) under the tongue every 5 (five) minutes as needed for chest pain.   30 tablet   0   . ranitidine (ZANTAC) 150 MG tablet   Oral   Take 1 tablet (150 mg total) by mouth at bedtime.   90 tablet   3   . risperiDONE (RISPERDAL) 0.5 MG tablet   Oral   Take 1 tablet (0.5 mg total) by mouth 2 (two) times daily.   90 tablet   1   . sertraline (ZOLOFT) 100 MG tablet   Oral   Take 1 tablet (100 mg total) by mouth daily.   90 tablet   3   . traMADol (ULTRAM) 50 MG tablet   Oral   Take 1 tablet (50 mg total) by mouth every 8 (eight) hours as needed for pain.   60 tablet   0    BP 91/47  Pulse 72  Temp(Src) 98.3 F (36.8 C) (Rectal)  Resp 18  SpO2 94% Physical Exam  Nursing note and vitals reviewed. Constitutional: He is oriented to person, place, and time. He appears well-developed and well-nourished.  Frail elderly male, ill appearing  HENT:  Head: Normocephalic and atraumatic.  Nose: Nose normal.  Dry mucous membranes  Eyes: Conjunctivae and EOM are normal. Pupils are equal, round, and reactive to light.  Neck: Normal range of motion. Neck supple. No JVD present. No tracheal deviation present. No thyromegaly present.  Cardiovascular: Normal rate, regular rhythm, normal heart  sounds and intact distal pulses.  Exam reveals no gallop and no friction rub.   No murmur heard. Pulmonary/Chest:  Effort normal and breath sounds normal. No stridor. No respiratory distress. He has no wheezes. He has no rales. He exhibits no tenderness.  Abdominal: Soft. He exhibits no distension and no mass. There is no tenderness. There is no rebound and no guarding.  Hyperactive bowel sounds  Musculoskeletal: Normal range of motion. He exhibits no edema and no tenderness.  Lymphadenopathy:    He has no cervical adenopathy.  Neurological: He is alert and oriented to person, place, and time. He exhibits normal muscle tone. Coordination normal.  Skin: Skin is warm and dry. No rash noted. No erythema. No pallor.  Psychiatric: He has a normal mood and affect. His behavior is normal. Judgment and thought content normal.    ED Course  Procedures (including critical care time)  CRITICAL CARE Performed by: Olivia Mackie Total critical care time: 30 min Critical care time was exclusive of separately billable procedures and treating other patients. Critical care was necessary to treat or prevent imminent or life-threatening deterioration. Critical care was time spent personally by me on the following activities: development of treatment plan with patient and/or surrogate as well as nursing, discussions with consultants, evaluation of patient's response to treatment, examination of patient, obtaining history from patient or surrogate, ordering and performing treatments and interventions, ordering and review of laboratory studies, ordering and review of radiographic studies, pulse oximetry and re-evaluation of patient's condition.  Labs Review Labs Reviewed  COMPREHENSIVE METABOLIC PANEL - Abnormal; Notable for the following:    Sodium 126 (*)    Chloride 94 (*)    CO2 12 (*)    Glucose, Bld 104 (*)    BUN 138 (*)    Creatinine, Ser 4.06 (*)    Albumin 2.9 (*)    Alkaline Phosphatase 182 (*)    GFR calc non Af Amer 13 (*)    GFR calc Af Amer 15 (*)    All other components within normal limits  CBC WITH  DIFFERENTIAL - Abnormal; Notable for the following:    WBC 15.8 (*)    RBC 3.94 (*)    Hemoglobin 12.1 (*)    HCT 34.3 (*)    Neutrophils Relative % 81 (*)    Neutro Abs 12.8 (*)    Lymphocytes Relative 8 (*)    Monocytes Absolute 1.6 (*)    All other components within normal limits  CLOSTRIDIUM DIFFICILE BY PCR  URINALYSIS, ROUTINE W REFLEX MICROSCOPIC  GI PATHOGEN PANEL BY PCR, STOOL   Imaging Review No results found.  EKG Interpretation   None       MDM   1. Dehydration, severe   2. Acute renal failure   3. Metabolic acidosis, increased anion gap    78 yo male with 1 week diarrhea.  Now with weakness.  Pt appears severely dehydrated.  Labs show metabolic acidosis, anion gap 20, acute renal failure.  Will d/w hospitalist for admission.    Olivia Mackie, MD 06/17/13 1610  Olivia Mackie, MD 06/17/13 [phone]

## 2013-06-17 NOTE — Progress Notes (Signed)
CRITICAL VALUE ALERT  Critical value received:  Trop 0.34  Date of notification:  06/17/2013  Time of notification:  1655  Critical value read back: yes  Nurse who received alert:  Tereasa Coop, RN  MD notified (1st page):  Dr. Butler Denmark  Time of first page:  1657  MD notified (2nd page):   Time of second page:  Responding MD:    Time MD responded:

## 2013-06-17 NOTE — Progress Notes (Signed)
Pt arrived from [facility], wife at bedside. Oriented to unit and routine, call bell in reach, CMT and Elink notified. Will continue to monitor  Spoke with ID about C-diff precautions. Pt PCR negative, clarified that pt didn't need to remain on contact  For gi pathogen panel to come back.   Will continue to monitor.

## 2013-06-17 NOTE — Progress Notes (Signed)
[facility] Progress Note [place] TEAM 1 - Stepdown ICU Team   [name] OZH:[id number] DOB: 1935-06-12 DOA: 06/16/2013 PCP: [name], MD  Brief narrative: 78 y.o. male was brought to the ER the patient was found to be very weak. As per the family patient had been experiencing diarrhea over the last one week but had not had any nausea or vomiting. Two days prior to admission the patient had periumbilical pain which has subsequently resolved. He had not been eating well for 2 days. Apparently other family members with similar GI illness.. In the ER patient was found initially to be hypotensive and was given 2 L normal saline bolus followed by another 2 L. After the fluid boluses patient's blood pressure improved. Lactic acid levels have been normal. Patient's creatinine was markedly increased from baseline with an associated metabolic acidosis. Recently the patient had been having increasing hallucinations and was started on Risperdal by his primary care physician.   Assessment/Plan: Active Problems:   Acute renal failure/Metabolic acidosis   -baseline renal function 8/0.85 -continue hydration -etiology likely pre renal with ATN from hypotension/ACE I pre admit (on hold)    Diarrhea -has decreased since admit -C. Diff negative- GI pathogen panel pending but suspect viral etiology -pt admits today to prior cough so will also check urinary Legionella  -cont supportive care -cont empiric anbx's    CORONARY ARTERY DISEASE -TNI x 3 negative    Rhabdomyolysis -severe- > 25,000!! -begin bicarb drip in dextrose -watch sodium and K+    Hypotension -better- cont IVFs-500 cc bolus since had episode of SVT -+/- due to sepsis- PCT borderline elevated at 0.47 -cortisol pending    SVT -due to stress, DH and BB withdrawal -begin low dose IV Lopressor    Transaminitis  -due to low perfusion/hypotension (shock liver) -follow labs    Unspecified Iron Deficiency Anemia    RENAL  ARTERY STENOSIS    COPD    HTN (hypertension) -BP low- hold all home meds   DVT prophylaxis: SCDs Code Status: Full Family Communication: Patient Disposition Plan/Expected LOS: Stepdown Isolation: Contact isolation Nutritional Status: Acute protein calorie malnutrition related to ongoing GI illness  Consultants: None  Procedures: None  Antibiotics: Cipro 11/4 >>> Flagyl 11/4 >>>  HPI/Subjective:   Objective: Blood pressure 115/41, pulse 93, temperature 98.5 F (36.9 C), temperature source Oral, resp. rate 17, SpO2 96.00%.  Intake/Output Summary (Last 24 hours) at 06/17/13 1406 Last data filed at 06/17/13 1210  Gross per 24 hour  Intake   4920 ml  Output    900 ml  Net   4020 ml     Exam: All up exam completed. Patient admitted at 3:41 AM today  Scheduled Meds:  Scheduled Meds: . sodium chloride  1,000 mL Intravenous Once   Followed by  . sodium chloride  1,000 mL Intravenous Once  . aspirin  81 mg Oral Daily  . atorvastatin  40 mg Oral Daily  . cilostazol  100 mg Oral BID  . ciprofloxacin  400 mg Intravenous Q24H  . clopidogrel  75 mg Oral Q breakfast  . famotidine  20 mg Oral BID  . ferrous sulfate  325 mg Oral TID WC  . loratadine  10 mg Oral Daily  . metoprolol  5 mg Intravenous Q6H  . metronidazole  500 mg Intravenous Q8H  . pantoprazole  40 mg Oral Daily  . risperiDONE  0.5 mg Oral BID  . sertraline  100 mg Oral Daily  .  sodium chloride  500 mL Intravenous Once  . sodium chloride  3 mL Intravenous Q12H   Continuous Infusions: .  sodium bicarbonate  infusion 1000 mL 150 mL/hr at 06/17/13 1256    **Reviewed in detail by the Attending Physician  Data Reviewed: Basic Metabolic Panel:  Recent Labs Lab 06/17/13 0012 06/17/13 0230  NA 126* 131*  K 4.2 4.1  CL 94* 104  CO2 12* 11*  GLUCOSE 104* 101*  BUN 138* 121*  CREATININE 4.06* 3.31*  CALCIUM 9.0 8.5   Liver Function Tests:  Recent Labs Lab 06/17/13 0012 06/17/13 0230  AST  16 12  ALT 8 7  ALKPHOS 182* 156*  BILITOT 0.3 0.2*  PROT 6.8 6.2  ALBUMIN 2.9* 2.5*   No results found for this basename: LIPASE, AMYLASE,  in the last 168 hours  Recent Labs Lab 06/17/13 0554  AMMONIA 22   CBC:  Recent Labs Lab 06/17/13 0012 06/17/13 0554  WBC 15.8* 18.4*  NEUTROABS 12.8* 15.4*  HGB 12.1* 12.1*  HCT 34.3* 34.2*  MCV 87.1 87.9  PLT 233 236   Cardiac Enzymes:  Recent Labs Lab 06/17/13 0230 06/17/13 0554 06/17/13 0950  TROPONINI <0.30 <0.30 <0.30   BNP (last 3 results)  Recent Labs  12/23/12 2045  PROBNP 1188.0*   CBG:  Recent Labs Lab 06/17/13 0425  GLUCAP 105*    Recent Results (from the past 240 hour(s))  CLOSTRIDIUM DIFFICILE BY PCR     Status: None   Collection Time    06/17/13  1:51 AM      Result Value Range Status   C difficile by pcr NEGATIVE  NEGATIVE Final  MRSA PCR SCREENING     Status: None   Collection Time    06/17/13  4:27 AM      Result Value Range Status   MRSA by PCR NEGATIVE  NEGATIVE Final   Comment:            The GeneXpert MRSA Assay (FDA     approved for NASAL specimens     only), is one component of a     comprehensive MRSA colonization     surveillance program. It is not     intended to diagnose MRSA     infection nor to guide or     monitor treatment for     MRSA infections.     Studies:  Recent x-ray studies have been reviewed in detail by the Attending Physician     Junious Silk, ANP [facility] Office  [phone] Pager [phone]  **If unable to reach the above provider after paging please contact the Flow Manager @ [phone]  On-Call/Text Page:      [email or web address]      password [email or web address]  If 7PM-7AM, please contact night-coverage [email or web address] Password [email or web address] 06/17/2013, 2:06 PM   LOS: 1 day   I have examined the patient, reviewed the chart and modified the above note which I agree with.   Linah Klapper,MD [phone] 07/18/2013, 9:52 AM

## 2013-06-17 NOTE — H&P (Signed)
[facility] History and Physical  ARIO MCDIARMID UYQ:[id number] DOB: 1935-02-26 DOA: 06/16/2013  Referring physician: ER physician. PCP: [name], MD   Chief Complaint: Weakness and diarrhea.  HPI: [name] is a 78 y.o. male was brought to the ER the patient was found to be very weak. As per the family patient has been experiencing diarrhea over the last one week but has not had any nausea vomiting. 2 days ago patient had periumbilical pain which at this time patient states has resolved. Patient also has not been eating well last 2 days. Patient states that otherwise he did not have any chest pain earlier but in the ER will examine patient had some mild chest discomfort which is off spontaneously. As per the patient has pain diarrhea running in his family over the last 2 weeks. In the ER patient was found initially to be hypotensive and was given 2 L normal saline bolus initially followed by another 2 L. After the fluid boluses patient's blood pressure improved. Lactic acid levels have been normal. Patient's creatinine has been markedly increased from baseline. Patient is also found to be having metabolic acidosis. Patient had it is not have any productive cough fever chills. Of recently patient has been having increasing hallucinations and was started on Risperdal by patient's primary care physician.  Review of Systems: As presented in the history of presenting illness, rest negative.  Past Medical History  Diagnosis Date  . CEREBROVASCULAR ACCIDENT, HX OF 03/02/2007  . CHEST PAIN 01/31/2010  . CHRONIC OBSTRUCTIVE PULMONARY DISEASE, ACUTE EXACERBATION 02/18/2009  . COPD 03/02/2007  . CORONARY ARTERY DISEASE 03/02/2007  . DEPRESSION 09/23/2007    anxiety  . DISEASE, PERIPHERAL VASCULAR NEC 03/02/2007  . DIVERTICULOSIS, COLON 09/23/2007  . FATIGUE 01/22/2009  . GERD 03/02/2007  . GLUCOSE INTOLERANCE 09/23/2007  . HEMORRHOIDS 10/09/2007  . HIATAL HERNIA 10/09/2007  . HYPERCHOLESTEROLEMIA 10/09/2007   . HYPERTENSION 09/23/2007  . HYPOKALEMIA 10/09/2007  . MESENTERIC VASCULAR INSUFFICIENCY 07/06/2008  . MITRAL REGURGITATION 11/27/2008  . MI 10/09/2007  . Other specified forms of hearing loss 08/23/2010  . PERIPHERAL VASCULAR DISEASE 09/23/2007  . PUD 10/09/2007  . RENAL ARTERY STENOSIS 03/02/2007  . TOBACCO ABUSE 04/20/2010  . 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 Unspecified Iron Deficiency Anemia 09/23/2007  . URINARY RETENTION 01/22/2009  . VITAMIN B12 DEFICIENCY 01/03/2010  . WRIST PAIN, RIGHT 06/06/2010  . Impaired glucose tolerance 01/29/2011  . Hearing loss in left ear 01/30/2011  . Blindness of both eyes 01/30/2011  . Hyperlipidemia   . Anemia, iron deficiency   . Atrial flutter   . HTN (hypertension) 03/21/2011  . Peripheral arterial disease    Past Surgical History  Procedure Laterality Date  . Coronary artery bypass graft  1995    LIMA-LAD, SVG-OM, SVG-D, SVG-PDA  . Renal artery stenting  2004    bilateral  . S/p multiple le vascular bypass      includine fem-pop x 2 LLE  . S/p bilateral cea    . Coronary angioplasty with stent placement  2010    DES x 2 SVG-OM, cutting balloon PTCA SVG-PDA for ISR  . Appendectomy    . S/p lumbar disc surgury      x2  . S/p rle fem bypass feb 2011    . Cardiac catheterization    . Pr vein bypass graft,aorto-fem-pop    . Carotid endarterectomy    . Coronary angioplasty with stent placement  2011    DES to  SVG-DIAG, SVG-PDA  . Esophagogastroduodenoscopy    . Colonoscopy     Social History:  reports that he has been smoking Cigarettes.  He has a 60 pack-year smoking history. He has never used smokeless tobacco. He reports that he does not drink alcohol or use illicit drugs. Where does patient live home. Can patient participate in ADLs? Yes.  No Known Allergies  Family History:  Family History  Problem Relation Age of Onset  . Colon cancer Mother 33  . Heart disease Father   . Cancer Father   . Heart disease Sister   .  Prostate cancer Brother     prostate cancer  . Stroke Sister       Prior to Admission medications   Medication Sig Start Date End Date Taking? Authorizing Provider  amLODipine (NORVASC) 10 MG tablet Take 10 mg by mouth daily.   Yes Historical Provider, MD  aspirin 81 MG EC tablet Take 81 mg by mouth daily.     Yes Historical Provider, MD  atorvastatin (LIPITOR) 40 MG tablet Take 40 mg by mouth daily.   Yes Historical Provider, MD  cilostazol (PLETAL) 100 MG tablet Take 100 mg by mouth 2 (two) times daily.   Yes Historical Provider, MD  clopidogrel (PLAVIX) 75 MG tablet Take 75 mg by mouth daily.   Yes Historical Provider, MD  esomeprazole (NEXIUM) 40 MG capsule Take 1 capsule (40 mg total) by mouth daily. 12/20/12  Yes Corwin Levins, MD  ferrous sulfate 325 (65 FE) MG tablet Take 325 mg by mouth 3 (three) times daily.     Yes Historical Provider, MD  fexofenadine (ALLEGRA) 180 MG tablet Take 180 mg by mouth daily.   Yes Historical Provider, MD  isosorbide mononitrate (IMDUR) 60 MG 24 hr tablet Take 60 mg by mouth daily.   Yes Historical Provider, MD  lisinopril (PRINIVIL,ZESTRIL) 10 MG tablet Take 10 mg by mouth daily.   Yes Historical Provider, MD  metoprolol (LOPRESSOR) 100 MG tablet Take 1 tablet (100 mg total) by mouth 2 (two) times daily. 01/08/13  Yes Kathleene Hazel, MD  nitroGLYCERIN (NITROSTAT) 0.4 MG SL tablet Place 1 tablet (0.4 mg total) under the tongue every 5 (five) minutes as needed for chest pain. 12/17/12  Yes Rhetta Mura, MD  ranitidine (ZANTAC) 150 MG tablet Take 1 tablet (150 mg total) by mouth at bedtime. 01/03/13  Yes Corwin Levins, MD  risperiDONE (RISPERDAL) 0.5 MG tablet Take 1 tablet (0.5 mg total) by mouth 2 (two) times daily. 06/10/13  Yes Corwin Levins, MD  sertraline (ZOLOFT) 100 MG tablet Take 1 tablet (100 mg total) by mouth daily. 01/03/13  Yes Corwin Levins, MD  traMADol (ULTRAM) 50 MG tablet Take 1 tablet (50 mg total) by mouth every 8 (eight) hours as  needed for pain. 12/20/12  Yes Corwin Levins, MD    Physical Exam: Filed Vitals:   06/17/13 0145 06/17/13 0255 06/17/13 0258 06/17/13 0330  BP: 112/53 110/39 91/47 105/71  Pulse: 78 71 61 69  Temp:      TempSrc:      Resp: 25   21  SpO2: 97%   97%     General:  Well-developed and nourished.  Eyes: Icteric no pallor.  ENT: No discharge from the ears eyes nose mouth.  Neck: No mass felt.  Cardiovascular: S1-S2 heard.  Respiratory: No rhonchi or crepitations.  Abdomen: Soft nontender bowel sounds present.  Skin: No rash.  Musculoskeletal: No edema.  Psychiatric: Appears normal.  Neurologic: Alert awake oriented to time place and person. Moves all extremities.  Labs on Admission:  Basic Metabolic Panel:  Recent Labs Lab 06/17/13 0012  NA 126*  K 4.2  CL 94*  CO2 12*  GLUCOSE 104*  BUN 138*  CREATININE 4.06*  CALCIUM 9.0   Liver Function Tests:  Recent Labs Lab 06/17/13 0012  AST 16  ALT 8  ALKPHOS 182*  BILITOT 0.3  PROT 6.8  ALBUMIN 2.9*   No results found for this basename: LIPASE, AMYLASE,  in the last 168 hours No results found for this basename: AMMONIA,  in the last 168 hours CBC:  Recent Labs Lab 06/17/13 0012  WBC 15.8*  NEUTROABS 12.8*  HGB 12.1*  HCT 34.3*  MCV 87.1  PLT 233   Cardiac Enzymes: No results found for this basename: CKTOTAL, CKMB, CKMBINDEX, TROPONINI,  in the last 168 hours  BNP (last 3 results)  Recent Labs  12/23/12 2045  PROBNP 1188.0*   CBG: No results found for this basename: GLUCAP,  in the last 168 hours  Radiological Exams on Admission: No results found.  EKG: Independently reviewed. Sinus tachycardia with RBBB.  Assessment/Plan Principal Problem:   Acute renal failure Active Problems:   CORONARY ARTERY DISEASE   COPD   Diarrhea   Metabolic acidosis   1. Acute renal failure with severe dehydration and diarrhea - acute renal failure probably secondary to diarrhea and dehydration. Check  urine analysis and urine sodium and creatinine for FeNa. For antihypertensives and lisinopril due to renal failure and hypotension. Continue with aggressive hydration. Check blood cultures. 2. Diarrhea - check stool studies including culture and C. difficile. Patient has not been on any antibiotics recently. Since patient has had mild abdominal discomfort 2 days ago CT abdomen and pelvis without contrast has been ordered. For now we will place patient on Cipro and Flagyl until cultures are available. 3. Metabolic acidosis - probably from renal failure. Check ABG and if severely acidotic then we will start bicarbonate drip. 4. Stool for occult blood positive - probably secondary to colitis or gastroenteritis. Closely follow CBC. Check CT abdomen and pelvis. 5. CAD status post CABG - patient had mild chest discomfort in the ER. Cycle cardiac markers. 6. COPD - presently not wheezing. 7. History of hypertension - holding off antihypertensives until blood pressure is stable. 8. Peripheral vascular disease status post femoropopliteal bypass.    Code Status: Code.  Family Communication: Patient's wife at the bedside.  Disposition Plan: Admit to inpatient.    Trason Shifflet N. [facility] Pager [phone].  If 7PM-7AM, please contact night-coverage [email or web address] Password [email or web address] 06/17/2013, 3:41 AM

## 2013-06-17 NOTE — Progress Notes (Signed)
ANTIBIOTIC CONSULT NOTE - INITIAL  Pharmacy Consult for Cipro per Rx (Flagyl per MD) Indication: r/o abdominal infection   No Known Allergies  Vital Signs: Temp: 97.6 F (36.4 C) (11/04 0430) Temp src: Oral (11/04 0430) BP: 89/31 mmHg (11/04 0600) Pulse Rate: 73 (11/04 0600) Labs:  Recent Labs  06/17/13 0012 06/17/13 0230 06/17/13 0542 06/17/13 0554  WBC 15.8*  --   --  18.4*  HGB 12.1*  --   --  12.1*  PLT 233  --   --  236  LABCREA  --   --  69.93  --   CREATININE 4.06* 3.31*  --   --     Microbiology: Recent Results (from the past 720 hour(s))  MRSA PCR SCREENING     Status: None   Collection Time    06/17/13  4:27 AM      Result Value Range Status   MRSA by PCR NEGATIVE  NEGATIVE Final   Comment:            The GeneXpert MRSA Assay (FDA     approved for NASAL specimens     only), is one component of a     comprehensive MRSA colonization     surveillance program. It is not     intended to diagnose MRSA     infection nor to guide or     monitor treatment for     MRSA infections.    Medical History: Past Medical History  Diagnosis Date  . CEREBROVASCULAR ACCIDENT, HX OF 03/02/2007  . CHEST PAIN 01/31/2010  . CHRONIC OBSTRUCTIVE PULMONARY DISEASE, ACUTE EXACERBATION 02/18/2009  . COPD 03/02/2007  . CORONARY ARTERY DISEASE 03/02/2007  . DEPRESSION 09/23/2007    anxiety  . DISEASE, PERIPHERAL VASCULAR NEC 03/02/2007  . DIVERTICULOSIS, COLON 09/23/2007  . FATIGUE 01/22/2009  . GERD 03/02/2007  . GLUCOSE INTOLERANCE 09/23/2007  . HEMORRHOIDS 10/09/2007  . HIATAL HERNIA 10/09/2007  . HYPERCHOLESTEROLEMIA 10/09/2007  . HYPERTENSION 09/23/2007  . HYPOKALEMIA 10/09/2007  . MESENTERIC VASCULAR INSUFFICIENCY 07/06/2008  . MITRAL REGURGITATION 11/27/2008  . MI 10/09/2007  . Other specified forms of hearing loss 08/23/2010  . PERIPHERAL VASCULAR DISEASE 09/23/2007  . PUD 10/09/2007  . RENAL ARTERY STENOSIS 03/02/2007  . TOBACCO ABUSE 04/20/2010  . TUBULOVILLOUS ADENOMA, COLON, HX OF  02/07/2010  . Unspecified Iron Deficiency Anemia 09/23/2007  . URINARY RETENTION 01/22/2009  . VITAMIN B12 DEFICIENCY 01/03/2010  . WRIST PAIN, RIGHT 06/06/2010  . Impaired glucose tolerance 01/29/2011  . Hearing loss in left ear 01/30/2011  . Blindness of both eyes 01/30/2011  . Hyperlipidemia   . Anemia, iron deficiency   . Atrial flutter   . HTN (hypertension) 03/21/2011  . Peripheral arterial disease    Assessment: 78 y/o M here with weakness/diarrhea. WBC 18.4, CT abdomen with possible enteritis, labs as above.   Goal of Therapy:  Clinical resolution   Plan:  -Cipro 400 mg IV q24h (CrCl <30) -Flagyl per MD -Trend WBC, temp, renal function  -F/U length of treatment, change to PO  Thank you for allowing me to take part in this patient's care,  Abran Duke, PharmD Clinical Pharmacist Phone: [phone] Pager: [phone] 06/17/2013 7:05 AM

## 2013-06-17 NOTE — ED Notes (Signed)
Dr. Norlene Campbell notified of I stat lactic acid results by B. Bing Plume, EMT

## 2013-06-18 LAB — COMPREHENSIVE METABOLIC PANEL
AST: 24 U/L (ref 0–37)
Albumin: 2.3 g/dL — ABNORMAL LOW (ref 3.5–5.2)
Alkaline Phosphatase: 150 U/L — ABNORMAL HIGH (ref 39–117)
BUN: 71 mg/dL — ABNORMAL HIGH (ref 6–23)
CO2: 19 mEq/L (ref 19–32)
Chloride: 104 mEq/L (ref 96–112)
Creatinine, Ser: 1.5 mg/dL — ABNORMAL HIGH (ref 0.50–1.35)
GFR calc Af Amer: 50 mL/min — ABNORMAL LOW (ref >90)
Glucose, Bld: 144 mg/dL — ABNORMAL HIGH (ref 70–99)
Potassium: 3.1 mEq/L — ABNORMAL LOW (ref 3.5–5.1)
Total Bilirubin: 0.3 mg/dL (ref 0.3–1.2)
Total Protein: 5.9 g/dL — ABNORMAL LOW (ref 6.0–8.3)

## 2013-06-18 LAB — CBC
HCT: 31 % — ABNORMAL LOW (ref 39.0–52.0)
MCH: 31.2 pg (ref 26.0–34.0)
MCHC: 36.1 g/dL — ABNORMAL HIGH (ref 30.0–36.0)
MCV: 86.4 fL (ref 78.0–100.0)
Platelets: 194 10*3/uL (ref 150–400)
RBC: 3.59 MIL/uL — ABNORMAL LOW (ref 4.22–5.81)
WBC: 13 10*3/uL — ABNORMAL HIGH (ref 4.0–10.5)

## 2013-06-18 LAB — LEGIONELLA ANTIGEN, URINE

## 2013-06-18 LAB — MAGNESIUM: Magnesium: 1.4 mg/dL — ABNORMAL LOW (ref 1.5–2.5)

## 2013-06-18 MED ORDER — SODIUM CHLORIDE 0.9 % IV SOLN
INTRAVENOUS | Status: DC
  Administered 2013-06-18 (×2): via INTRAVENOUS

## 2013-06-18 MED ORDER — SODIUM CHLORIDE 0.9 % IV BOLUS (SEPSIS)
500.0000 mL | Freq: Once | INTRAVENOUS | Status: AC
  Administered 2013-06-18: 500 mL via INTRAVENOUS

## 2013-06-18 MED ORDER — METOPROLOL TARTRATE 100 MG PO TABS
100.0000 mg | ORAL_TABLET | Freq: Two times a day (BID) | ORAL | Status: DC
  Administered 2013-06-18: 100 mg via ORAL
  Filled 2013-06-18 (×4): qty 1

## 2013-06-18 MED ORDER — POTASSIUM CHLORIDE 10 MEQ/100ML IV SOLN
10.0000 meq | INTRAVENOUS | Status: AC
  Administered 2013-06-18 (×3): 10 meq via INTRAVENOUS
  Filled 2013-06-18: qty 100

## 2013-06-18 NOTE — Progress Notes (Signed)
Gi panel back-pt placed back on enteric precautions over night.  Still no stool to send sample.   Will continue to monitor.

## 2013-06-18 NOTE — Progress Notes (Signed)
[facility] Progress Note [place] TEAM 1 - Stepdown ICU Team   [name]:[id number] DOB: 07-20-1935 DOA: 06/16/2013 PCP: [name], MD  Brief narrative: 78 y.o. male who was brought to the ER after the patient was found to be very weak. As per the family patient had been experiencing diarrhea over the last one week but had not had any nausea or vomiting. Two days prior to admission the patient had periumbilical pain which has subsequently resolved. He had not been eating well for 2 days. Apparently other family members had similar GI illnesses or an. In the ER patient was found initially to be hypotensive and was given 2 L normal saline bolus followed by another 2 L. After the fluid boluses patient's blood pressure improved. Lactic acid levels have been normal. Patient's creatinine was markedly increased from baseline with an associated metabolic acidosis. Recently the patient had been having increasing hallucinations and was started on Risperdal by his primary care physician.  Assessment/Plan:    Acute renal failure/Metabolic acidosis   -baseline renal function 8 / 0.85 -continue hydration -etiology likely pre renal with ATN from hypotension/ACE I pre admit (on hold)    Diarrhea -has decreased since admit -C. Diff negative- GI pathogen panel positive for Norovirus -urinary Legionella negative -cont supportive care -discontinue empiric anbx's as this is a viral illness    CORONARY ARTERY DISEASE -TNI x 3 negative    Rhabdomyolysis? - ruled out -CK normal range - does NOT appear CK was obtained earlier in presentation (bicarb was being given due to severe renal failure)    Hypotension - cont IVFs-500 cc bolus since recurrent SVT    SVT vs atrial flutter -due to stress, DH and BB withdrawal -cont Lopressor but suspect rate driven by dehydration -EKG new RBBB with AF vs ST with prolonged PR -check  D dimer in event tachycardia from PE    Transaminitis  -due to low  perfusion/hypotension (shock liver) -follow labs-LFTs trending down    Unspecified Iron Deficiency Anemia    RENAL ARTERY STENOSIS    COPD    HTN (hypertension) -BP low- hold all home meds   DVT prophylaxis: SCDs Code Status: Full Family Communication: Patient Disposition Plan/Expected LOS: Stepdown Isolation: Contact isolation  Consultants: None  Procedures: None  Antibiotics: Cipro 11/4 >>>11/5 Flagyl 11/4 >>>11/5  HPI/Subjective: Pt endorses significant malaise and anorexia. No CP or SOB  Objective: Blood pressure 102/45, pulse 111, temperature 98.4 F (36.9 C), temperature source Oral, resp. rate 20, height 6' (1.829 m), weight 174 lb 2.6 oz (79 kg), SpO2 92.00%.  Intake/Output Summary (Last 24 hours) at 06/18/13 1429 Last data filed at 06/18/13 0900  Gross per 24 hour  Intake   3490 ml  Output   2150 ml  Net   1340 ml   Exam: General: Mild acute acute respiratory distress Lungs: Clear to auscultation bilaterally without wheezes or crackles, RA Cardiovascular: Irregular rapid rate and rhythm without murmur gallop or rub normal S1 and S2, no peripheral edema or JVD Abdomen: Nontender, nondistended, soft, bowel sounds positive, no rebound, no ascites, no appreciable mass Musculoskeletal: No significant cyanosis, clubbing of bilateral lower extremities Neurological: Awake and oriented but somewhat lethargic, moves all extremities x 4 without focal neurological deficits, CN 2-12 intact  Scheduled Meds:  Scheduled Meds: . aspirin  81 mg Oral Daily  . cilostazol  100 mg Oral BID  . clopidogrel  75 mg Oral Q breakfast  . famotidine  20 mg Oral  BID  . ferrous sulfate  325 mg Oral TID WC  . loratadine  10 mg Oral Daily  . metoprolol tartrate  100 mg Oral BID  . pantoprazole  40 mg Oral Daily  . risperiDONE  0.5 mg Oral BID  . sertraline  100 mg Oral Daily  . sodium chloride  500 mL Intravenous Once  . sodium chloride  3 mL Intravenous Q12H   Data  Reviewed: Basic Metabolic Panel:  Recent Labs Lab 06/17/13 0012 06/17/13 0230 06/18/13 0417  NA 126* 131* 139  K 4.2 4.1 3.1*  CL 94* 104 104  CO2 12* 11* 19  GLUCOSE 104* 101* 144*  BUN 138* 121* 71*  CREATININE 4.06* 3.31* 1.50*  CALCIUM 9.0 8.5 8.6   Liver Function Tests:  Recent Labs Lab 06/17/13 0012 06/17/13 0230 06/18/13 0417  AST 16 12 24   ALT 8 7 7   ALKPHOS 182* 156* 150*  BILITOT 0.3 0.2* 0.3  PROT 6.8 6.2 5.9*  ALBUMIN 2.9* 2.5* 2.3*    Recent Labs Lab 06/17/13 0554  AMMONIA 22   CBC:  Recent Labs Lab 06/17/13 0012 06/17/13 0554 06/18/13 0417  WBC 15.8* 18.4* 13.0*  NEUTROABS 12.8* 15.4*  --   HGB 12.1* 12.1* 11.2*  HCT 34.3* 34.2* 31.0*  MCV 87.1 87.9 86.4  PLT 233 236 194   Cardiac Enzymes:  Recent Labs Lab 06/17/13 0230 06/17/13 0554 06/17/13 0950 06/17/13 1545 06/18/13 0417  CKTOTAL  --   --   --   --  80  TROPONINI <0.30 <0.30 <0.30 0.34*  --    BNP (last 3 results)  Recent Labs  12/23/12 2045  PROBNP 1188.0*   CBG:  Recent Labs Lab 06/17/13 0425  GLUCAP 105*    Recent Results (from the past 240 hour(s))  CLOSTRIDIUM DIFFICILE BY PCR     Status: None   Collection Time    06/17/13  1:51 AM      Result Value Range Status   C difficile by pcr NEGATIVE  NEGATIVE Final  MRSA PCR SCREENING     Status: None   Collection Time    06/17/13  4:27 AM      Result Value Range Status   MRSA by PCR NEGATIVE  NEGATIVE Final   Comment:            The GeneXpert MRSA Assay (FDA     approved for NASAL specimens     only), is one component of a     comprehensive MRSA colonization     surveillance program. It is not     intended to diagnose MRSA     infection nor to guide or     monitor treatment for     MRSA infections.  CULTURE, BLOOD (ROUTINE X 2)     Status: None   Collection Time    06/17/13  7:40 AM      Result Value Range Status   Specimen Description BLOOD LEFT HAND   Final   Special Requests BOTTLES DRAWN  AEROBIC AND ANAEROBIC 5CC   Final   Culture  Setup Time     Final   Value: 06/17/2013 13:55     Performed at [facility]   Culture     Final   Value:        BLOOD CULTURE RECEIVED NO GROWTH TO DATE CULTURE WILL BE HELD FOR 5 DAYS BEFORE ISSUING A FINAL NEGATIVE REPORT     Performed at [facility]  Report Status PENDING   Incomplete  CULTURE, BLOOD (ROUTINE X 2)     Status: None   Collection Time    06/17/13  7:55 AM      Result Value Range Status   Specimen Description BLOOD LEFT HAND   Final   Special Requests BOTTLES DRAWN AEROBIC AND ANAEROBIC 5CC   Final   Culture  Setup Time     Final   Value: 06/17/2013 13:55     Performed at [facility]   Culture     Final   Value:        BLOOD CULTURE RECEIVED NO GROWTH TO DATE CULTURE WILL BE HELD FOR 5 DAYS BEFORE ISSUING A FINAL NEGATIVE REPORT     Performed at [facility]   Report Status PENDING   Incomplete     Studies:  Recent x-ray studies have been reviewed in detail by the Attending Physician     Junious Silk, ANP [facility] Office  [phone] Pager [phone]  **If unable to reach the above provider after paging please contact the Flow Manager @ [phone]  On-Call/Text Page:      [email or web address]      password [email or web address]  If 7PM-7AM, please contact night-coverage [email or web address] Password [email or web address] 06/18/2013, 2:29 PM   LOS: 2 days   I have personally examined this patient and reviewed the entire database. I have reviewed the above note, made any necessary editorial changes, and agree with its content.  Lonia Blood, MD [facility]

## 2013-06-19 ENCOUNTER — Inpatient Hospital Stay (HOSPITAL_COMMUNITY): Payer: Medicare Other

## 2013-06-19 ENCOUNTER — Encounter (HOSPITAL_COMMUNITY): Payer: Self-pay | Admitting: Certified Registered"

## 2013-06-19 ENCOUNTER — Other Ambulatory Visit: Payer: Self-pay

## 2013-06-19 DIAGNOSIS — I469 Cardiac arrest, cause unspecified: Secondary | ICD-10-CM

## 2013-06-19 DIAGNOSIS — J9600 Acute respiratory failure, unspecified whether with hypoxia or hypercapnia: Secondary | ICD-10-CM

## 2013-06-19 DIAGNOSIS — I2699 Other pulmonary embolism without acute cor pulmonale: Secondary | ICD-10-CM

## 2013-06-19 DIAGNOSIS — J9601 Acute respiratory failure with hypoxia: Secondary | ICD-10-CM | POA: Diagnosis present

## 2013-06-19 DIAGNOSIS — I4891 Unspecified atrial fibrillation: Secondary | ICD-10-CM | POA: Diagnosis not present

## 2013-06-19 DIAGNOSIS — I472 Ventricular tachycardia: Secondary | ICD-10-CM

## 2013-06-19 DIAGNOSIS — I517 Cardiomegaly: Secondary | ICD-10-CM

## 2013-06-19 LAB — POCT I-STAT 3, ART BLOOD GAS (G3+)
Acid-base deficit: 8 mmol/L — ABNORMAL HIGH (ref 0.0–2.0)
Bicarbonate: 19.1 mEq/L — ABNORMAL LOW (ref 20.0–24.0)
O2 Saturation: 100 %
pH, Arterial: 7.26 — ABNORMAL LOW (ref 7.350–7.450)
pO2, Arterial: 225 mmHg — ABNORMAL HIGH (ref 80.0–100.0)

## 2013-06-19 LAB — RENAL FUNCTION PANEL
BUN: 23 mg/dL (ref 6–23)
CO2: 25 mEq/L (ref 19–32)
Calcium: 8.1 mg/dL — ABNORMAL LOW (ref 8.4–10.5)
GFR calc Af Amer: >90 mL/min (ref >90)
Glucose, Bld: 132 mg/dL — ABNORMAL HIGH (ref 70–99)
Phosphorus: 1.3 mg/dL — ABNORMAL LOW (ref 2.3–4.6)
Potassium: 2.9 mEq/L — ABNORMAL LOW (ref 3.5–5.1)
Sodium: 140 mEq/L (ref 135–145)

## 2013-06-19 LAB — COMPREHENSIVE METABOLIC PANEL
ALT: 11 U/L (ref 0–53)
Alkaline Phosphatase: 143 U/L — ABNORMAL HIGH (ref 39–117)
BUN: 38 mg/dL — ABNORMAL HIGH (ref 6–23)
CO2: 27 mEq/L (ref 19–32)
GFR calc Af Amer: 75 mL/min — ABNORMAL LOW (ref >90)
GFR calc non Af Amer: 65 mL/min — ABNORMAL LOW (ref >90)
Glucose, Bld: 96 mg/dL (ref 70–99)
Potassium: 3.4 mEq/L — ABNORMAL LOW (ref 3.5–5.1)
Sodium: 145 mEq/L (ref 135–145)
Total Bilirubin: 0.4 mg/dL (ref 0.3–1.2)

## 2013-06-19 LAB — MAGNESIUM
Magnesium: 2.3 mg/dL (ref 1.5–2.5)
Magnesium: 1.8 mg/dL (ref 1.5–2.5)

## 2013-06-19 LAB — BASIC METABOLIC PANEL
BUN: 29 mg/dL — ABNORMAL HIGH (ref 6–23)
CO2: 21 mEq/L (ref 19–32)
Calcium: 7.7 mg/dL — ABNORMAL LOW (ref 8.4–10.5)
GFR calc non Af Amer: 68 mL/min — ABNORMAL LOW (ref >90)
Glucose, Bld: 200 mg/dL — ABNORMAL HIGH (ref 70–99)
Potassium: 3.5 mEq/L (ref 3.5–5.1)

## 2013-06-19 LAB — PHOSPHORUS: Phosphorus: 1.4 mg/dL — ABNORMAL LOW (ref 2.3–4.6)

## 2013-06-19 LAB — CK TOTAL AND CKMB (NOT AT ARMC)
CK, MB: 16.7 ng/mL (ref 0.3–4.0)
Relative Index: 7.9 — ABNORMAL HIGH (ref 0.0–2.5)

## 2013-06-19 LAB — TROPONIN I
Troponin I: 4.53 ng/mL (ref <0.30)
Troponin I: 5.16 ng/mL (ref <0.30)

## 2013-06-19 MED ORDER — MIDAZOLAM HCL 2 MG/2ML IJ SOLN
INTRAMUSCULAR | Status: AC
  Administered 2013-06-19: 4 mg
  Filled 2013-06-19: qty 4

## 2013-06-19 MED ORDER — POTASSIUM CHLORIDE 10 MEQ/100ML IV SOLN
10.0000 meq | INTRAVENOUS | Status: AC
  Administered 2013-06-19 (×2): 10 meq via INTRAVENOUS
  Filled 2013-06-19 (×2): qty 100

## 2013-06-19 MED ORDER — FENTANYL CITRATE 0.05 MG/ML IJ SOLN
50.0000 ug | Freq: Once | INTRAMUSCULAR | Status: AC
  Administered 2013-06-19: 50 ug via INTRAVENOUS

## 2013-06-19 MED ORDER — MIDAZOLAM HCL 2 MG/2ML IJ SOLN
2.0000 mg | Freq: Once | INTRAMUSCULAR | Status: AC
  Administered 2013-06-19: 2 mg via INTRAVENOUS

## 2013-06-19 MED ORDER — PHENYLEPHRINE HCL 10 MG/ML IJ SOLN
30.0000 ug/min | INTRAVENOUS | Status: DC
  Administered 2013-06-19: 200 ug/min via INTRAVENOUS
  Filled 2013-06-19 (×3): qty 4

## 2013-06-19 MED ORDER — FENTANYL CITRATE 0.05 MG/ML IJ SOLN
INTRAMUSCULAR | Status: AC
  Administered 2013-06-19: 50 ug via INTRAVENOUS
  Filled 2013-06-19: qty 2

## 2013-06-19 MED ORDER — PHENYLEPHRINE HCL 10 MG/ML IJ SOLN
30.0000 ug/min | INTRAVENOUS | Status: DC
  Administered 2013-06-19: 150 ug/min via INTRAVENOUS
  Filled 2013-06-19: qty 1

## 2013-06-19 MED ORDER — POTASSIUM & SODIUM PHOSPHATES 280-160-250 MG PO PACK
2.0000 | PACK | Freq: Three times a day (TID) | ORAL | Status: DC
  Administered 2013-06-19 – 2013-06-22 (×9): 2 via NASOGASTRIC
  Filled 2013-06-19 (×17): qty 2

## 2013-06-19 MED ORDER — FENTANYL CITRATE 0.05 MG/ML IJ SOLN
INTRAMUSCULAR | Status: AC
  Administered 2013-06-19: 100 ug via INTRAVENOUS
  Filled 2013-06-19: qty 2

## 2013-06-19 MED ORDER — INSULIN ASPART 100 UNIT/ML ~~LOC~~ SOLN: 2.0000 [IU] | SUBCUTANEOUS | Status: DC

## 2013-06-19 MED ORDER — PNEUMOCOCCAL VAC POLYVALENT 25 MCG/0.5ML IJ INJ
0.5000 mL | INJECTION | INTRAMUSCULAR | Status: AC
  Administered 2013-06-20: 0.5 mL via INTRAMUSCULAR
  Filled 2013-06-19: qty 0.5

## 2013-06-19 MED ORDER — SODIUM BICARBONATE 8.4 % IV SOLN
INTRAVENOUS | Status: DC
  Administered 2013-06-19 – 2013-06-20 (×2): via INTRAVENOUS
  Filled 2013-06-19 (×2): qty 150

## 2013-06-19 MED ORDER — MAGNESIUM SULFATE 40 MG/ML IJ SOLN
2.0000 g | Freq: Once | INTRAMUSCULAR | Status: AC
  Administered 2013-06-19: 2 g via INTRAVENOUS
  Filled 2013-06-19 (×2): qty 50

## 2013-06-19 MED ORDER — FENTANYL CITRATE 0.05 MG/ML IJ SOLN
INTRAMUSCULAR | Status: AC
  Administered 2013-06-19: 50 ug
  Filled 2013-06-19: qty 2

## 2013-06-19 MED ORDER — MAGNESIUM SULFATE 40 MG/ML IJ SOLN
2.0000 g | Freq: Once | INTRAMUSCULAR | Status: AC
  Administered 2013-06-19: 2 g via INTRAVENOUS

## 2013-06-19 MED ORDER — CHLORHEXIDINE GLUCONATE 0.12 % MT SOLN
15.0000 mL | Freq: Two times a day (BID) | OROMUCOSAL | Status: DC
  Administered 2013-06-19 – 2013-06-20 (×2): 15 mL via OROMUCOSAL
  Filled 2013-06-19 (×2): qty 15

## 2013-06-19 MED ORDER — SODIUM CHLORIDE 0.9 % IV SOLN
INTRAVENOUS | Status: DC
  Administered 2013-06-19: 13:00:00 via INTRAVENOUS

## 2013-06-19 MED ORDER — BIOTENE DRY MOUTH MT LIQD: 15.0000 mL | Freq: Four times a day (QID) | OROMUCOSAL | Status: DC

## 2013-06-19 MED ORDER — FENTANYL CITRATE 0.05 MG/ML IJ SOLN
INTRAMUSCULAR | Status: AC
  Administered 2013-06-19: 100 ug
  Filled 2013-06-19: qty 2

## 2013-06-19 MED ORDER — FENTANYL BOLUS VIA INFUSION
25.0000 ug | INTRAVENOUS | Status: DC | PRN
  Filled 2013-06-19: qty 50

## 2013-06-19 MED ORDER — SODIUM CHLORIDE 0.9 % IV SOLN
INTRAVENOUS | Status: DC
  Administered 2013-06-19 – 2013-06-22 (×2): via INTRAVENOUS

## 2013-06-19 MED ORDER — POTASSIUM PHOSPHATE DIBASIC 3 MMOLE/ML IV SOLN
30.0000 mmol | Freq: Once | INTRAVENOUS | Status: AC
  Administered 2013-06-19: 30 mmol via INTRAVENOUS
  Filled 2013-06-19: qty 10

## 2013-06-19 MED ORDER — AMIODARONE HCL IN DEXTROSE 360-4.14 MG/200ML-% IV SOLN
60.0000 mg/h | INTRAVENOUS | Status: DC
  Administered 2013-06-19 – 2013-06-20 (×4): 30 mg/h via INTRAVENOUS
  Administered 2013-06-21 (×2): 60 mg/h via INTRAVENOUS
  Administered 2013-06-21: 150 mg/h via INTRAVENOUS
  Administered 2013-06-21 – 2013-06-23 (×5): 60 mg/h via INTRAVENOUS
  Filled 2013-06-19 (×24): qty 200

## 2013-06-19 MED ORDER — SODIUM CHLORIDE 0.9 % IV SOLN
0.0000 ug/h | INTRAVENOUS | Status: DC
  Administered 2013-06-19: 25 ug/h via INTRAVENOUS
  Filled 2013-06-19: qty 50

## 2013-06-19 MED ORDER — SODIUM BICARBONATE 8.4 % IV SOLN
INTRAVENOUS | Status: AC
  Administered 2013-06-19: 100 meq
  Filled 2013-06-19: qty 100

## 2013-06-19 MED ORDER — FENTANYL CITRATE 0.05 MG/ML IJ SOLN
100.0000 ug | Freq: Once | INTRAMUSCULAR | Status: AC
  Administered 2013-06-19: 100 ug via INTRAVENOUS

## 2013-06-19 MED ORDER — MAGNESIUM SULFATE 40 MG/ML IJ SOLN
2.0000 g | Freq: Once | INTRAMUSCULAR | Status: AC
  Administered 2013-06-19: 2 g via INTRAVENOUS
  Filled 2013-06-19: qty 50

## 2013-06-19 MED ORDER — AMIODARONE HCL IN DEXTROSE 360-4.14 MG/200ML-% IV SOLN
60.0000 mg/h | INTRAVENOUS | Status: AC
  Administered 2013-06-19: 60 mg/h via INTRAVENOUS
  Filled 2013-06-19 (×2): qty 200

## 2013-06-19 MED ORDER — MIDAZOLAM HCL 2 MG/2ML IJ SOLN
INTRAMUSCULAR | Status: AC
  Administered 2013-06-19: 2 mg via INTRAVENOUS
  Filled 2013-06-19: qty 2

## 2013-06-19 MED ORDER — POTASSIUM PHOSPHATE DIBASIC 3 MMOLE/ML IV SOLN
20.0000 mmol | Freq: Once | INTRAVENOUS | Status: AC
  Administered 2013-06-19: 20 mmol via INTRAVENOUS
  Filled 2013-06-19 (×2): qty 6.67

## 2013-06-19 MED FILL — Medication: Qty: 1 | Status: AC

## 2013-06-19 NOTE — Procedures (Signed)
Extubation Procedure Note  Patient Details:   Name: [name] DOB: [birth year] MRN: [id number]   Airway Documentation:     Evaluation  O2 sats: stable throughout Complications: No apparent complications Patient did tolerate procedure well. Bilateral Breath Sounds: Clear;Diminished Suctioning: Oral;Airway Yes  Pt extubated per MD order.  ETT and oral suction prior to extubation.  Pt extubated to [facility].  Pt vocalizing well and no stridor noted.  Malachi Paradise 06/19/2013, 10:16 PM

## 2013-06-19 NOTE — Progress Notes (Signed)
Asked to evaluate patient for respiratory failure.  Patient seen and examined, weaning trials done.  VDRF was due to cardiac arrest.  RSBI <30 and patient is mentating well.  After examination decision was made to extubate.   Additional CC time of 30 min.  Alyson Reedy, M.D. [facility] Pulmonary/Critical Care Medicine. Pager: [phone]. After hours pager: [phone].

## 2013-06-19 NOTE — Progress Notes (Signed)
Pt laying in bed,c/o feeling lightheaded, diaphoretic, awake. Pt hr 206 Vtach, b/p taken 54/30. Code button pulled.

## 2013-06-19 NOTE — Progress Notes (Signed)
Lab revie by elink   CARDIAC   Recent Labs Lab 06/17/13 0230 06/17/13 0554 06/17/13 0950 06/17/13 1545 06/19/13 1256  TROPONINI <0.30 <0.30 <0.30 0.34* 4.53*   No results found for this basename: PROBNP,  in the last 168 hours elnk lab review of evening chemistries. Patient has orders for extubation by bedside MD  CHEMISTRY  Recent Labs Lab 06/17/13 0230 06/18/13 0417 06/18/13 1910 06/19/13 0357 06/19/13 1254 06/19/13 2035 06/19/13 2036  NA 131* 139  --  145 140  --  140  K 4.1 3.1*  --  3.4* 3.5  --  2.9*  CL 104 104  --  109 106  --  105  CO2 11* 19  --  27 21  --  25  GLUCOSE 101* 144*  --  96 200*  --  132*  BUN 121* 71*  --  38* 29*  --  23  CREATININE 3.31* 1.50*  --  1.08 1.03  --  0.89  CALCIUM 8.5 8.6  --  8.7 7.7*  --  8.1*  MG  --   --  1.4*  --  2.3 1.8  --   PHOS  --   --  1.3*  --   --  1.4* 1.3*   Estimated Creatinine Clearance: 67 ml/min (by C-G formula based on Cr of 1.03).  I/O last 3 completed shifts: In: 9718.8 [P.O.:120; I.V.:8838.8; IV Piggyback:760] Out: 1625 [Urine:1625]  A Low Phos Low Mag Low K  P Replete all 3   Dr. Kalman Shan, M.D., [facility].C.P Pulmonary and Critical Care Medicine Staff Physician  System [place] [facility] Pager: [phone], If no answer or between  15:00h - 7:00h: call [phone]  06/19/2013 9:40 PM

## 2013-06-19 NOTE — Progress Notes (Signed)
VASCULAR LAB PRELIMINARY  PRELIMINARY  PRELIMINARY  PRELIMINARY  Bilateral lower extremity venous duplex  completed.    Preliminary report:  Bilateral:  No evidence of DVT, superficial thrombosis, or Baker's Cyst.    Magdalyn Arenivas, RVT 06/19/2013, 12:50 PM

## 2013-06-19 NOTE — Progress Notes (Signed)
  Echocardiogram 2D Echocardiogram has been performed.  [name] 06/19/2013, 3:05 PM

## 2013-06-19 NOTE — Consult Note (Addendum)
CARDIOLOGY CONSULT NOTE   [name] MRN: [id number] DOB/AGE: 05-10-1935 78 y.o.  Admit date: 06/16/2013  Primary Physician   [name], MD Primary Cardiologist  [facility] Reason for Consultation   VT  HPI: [name] is a 78 year old man with PMH of MI, mitral regurgitation, extensive PVD, HTN, hx of CVA, ongoing tobacco use, end stage CAD s/p 4V CABG with medical management (last cardiac cath on 5/14 with severe native and graft disease patent SVG to OM but unvisualized LIMA), who presented to the [facility] ED on 05/16/13 with one week of diarrhea with hypotension that improved with fluids. His stool is positive for Noravirus. During this hospitalization he has had electrolyte derangements, a new RBBB, 1st degree AVB, and intermittent SVT. This morning he developed sustained SVT with VT with subsequent CPR of one minute duration and conversion to sinus rhythm after shock and amiodarone. After his transfer to the CCU he had an episode of V fib and asystole, regaining sinus rhythm after being shocked and with CPR. He remains in NSR but with occasional bradycardia.   Past Medical History  Diagnosis Date  . CEREBROVASCULAR ACCIDENT, HX OF 03/02/2007  . CHEST PAIN 01/31/2010  . CHRONIC OBSTRUCTIVE PULMONARY DISEASE, ACUTE EXACERBATION 02/18/2009  . COPD 03/02/2007  . CORONARY ARTERY DISEASE 03/02/2007  . DEPRESSION 09/23/2007    anxiety  . DISEASE, PERIPHERAL VASCULAR NEC 03/02/2007  . DIVERTICULOSIS, COLON 09/23/2007  . FATIGUE 01/22/2009  . GERD 03/02/2007  . GLUCOSE INTOLERANCE 09/23/2007  . HEMORRHOIDS 10/09/2007  . HIATAL HERNIA 10/09/2007  . HYPERCHOLESTEROLEMIA 10/09/2007  . HYPERTENSION 09/23/2007  . HYPOKALEMIA 10/09/2007  . MESENTERIC VASCULAR INSUFFICIENCY 07/06/2008  . MITRAL REGURGITATION 11/27/2008  . MI 10/09/2007  . Other specified forms of hearing loss 08/23/2010  . PERIPHERAL VASCULAR DISEASE 09/23/2007  . PUD 10/09/2007  . RENAL ARTERY STENOSIS 03/02/2007  . TOBACCO ABUSE 04/20/2010   . 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 Unspecified Iron Deficiency Anemia 09/23/2007  . URINARY RETENTION 01/22/2009  . VITAMIN B12 DEFICIENCY 01/03/2010  . WRIST PAIN, RIGHT 06/06/2010  . Impaired glucose tolerance 01/29/2011  . Hearing loss in left ear 01/30/2011  . Blindness of both eyes 01/30/2011  . Hyperlipidemia   . Anemia, iron deficiency   . Atrial flutter   . HTN (hypertension) 03/21/2011  . Peripheral arterial disease      Past Surgical History  Procedure Laterality Date  . Coronary artery bypass graft  1995    LIMA-LAD, SVG-OM, SVG-D, SVG-PDA  . Renal artery stenting  2004    bilateral  . S/p multiple le vascular bypass      includine fem-pop x 2 LLE  . S/p bilateral cea    . Coronary angioplasty with stent placement  2010    DES x 2 SVG-OM, cutting balloon PTCA SVG-PDA for ISR  . Appendectomy    . S/p lumbar disc surgury      x2  . S/p rle fem bypass feb 2011    . Cardiac catheterization    . Pr vein bypass graft,aorto-fem-pop    . Carotid endarterectomy    . Coronary angioplasty with stent placement  2011    DES to SVG-DIAG, SVG-PDA  . Esophagogastroduodenoscopy    . Colonoscopy      No Known Allergies  I have reviewed the patient's current medications . aspirin  81 mg Oral Daily  . cilostazol  100 mg Oral BID  . clopidogrel  75  mg Oral Q breakfast  . fentaNYL      . fentaNYL      . ferrous sulfate  325 mg Oral TID WC  . loratadine  10 mg Oral Daily  . magnesium sulfate 1 - 4 g bolus IVPB  2 g Intravenous Once  . midazolam      . pantoprazole  40 mg Oral Daily  . potassium & sodium phosphates  2 packet Per NG tube TID WC & HS  . potassium chloride  10 mEq Intravenous Q1 Hr x 2  . potassium phosphate IVPB (mmol)  30 mmol Intravenous Once  . risperiDONE  0.5 mg Oral BID  . sertraline  100 mg Oral Daily  . sodium chloride  500 mL Intravenous Once  . sodium chloride  3 mL Intravenous Q12H   . sodium chloride 150 mL/hr at 06/18/13 2220  .  amiodarone (NEXTERONE PREMIX) 360 mg/200 mL dextrose 60 mg/hr (06/19/13 1023)  . amiodarone (NEXTERONE PREMIX) 360 mg/200 mL dextrose    . fentaNYL infusion INTRAVENOUS    . phenylephrine (NEO-SYNEPHRINE) Adult infusion     acetaminophen, acetaminophen, fentaNYL, nitroGLYCERIN, ondansetron (ZOFRAN) IV, ondansetron, traMADol  Prior to Admission medications   Medication Sig Start Date End Date Taking? Authorizing Provider  amLODipine (NORVASC) 10 MG tablet Take 10 mg by mouth daily.   Yes Historical Provider, MD  aspirin 81 MG EC tablet Take 81 mg by mouth daily.     Yes Historical Provider, MD  atorvastatin (LIPITOR) 40 MG tablet Take 40 mg by mouth daily.   Yes Historical Provider, MD  cilostazol (PLETAL) 100 MG tablet Take 100 mg by mouth 2 (two) times daily.   Yes Historical Provider, MD  clopidogrel (PLAVIX) 75 MG tablet Take 75 mg by mouth daily.   Yes Historical Provider, MD  esomeprazole (NEXIUM) 40 MG capsule Take 1 capsule (40 mg total) by mouth daily. 12/20/12  Yes Corwin Levins, MD  ferrous sulfate 325 (65 FE) MG tablet Take 325 mg by mouth 3 (three) times daily.     Yes Historical Provider, MD  fexofenadine (ALLEGRA) 180 MG tablet Take 180 mg by mouth daily.   Yes Historical Provider, MD  isosorbide mononitrate (IMDUR) 60 MG 24 hr tablet Take 60 mg by mouth daily.   Yes Historical Provider, MD  lisinopril (PRINIVIL,ZESTRIL) 10 MG tablet Take 10 mg by mouth daily.   Yes Historical Provider, MD  metoprolol (LOPRESSOR) 100 MG tablet Take 1 tablet (100 mg total) by mouth 2 (two) times daily. 01/08/13  Yes Kathleene Hazel, MD  nitroGLYCERIN (NITROSTAT) 0.4 MG SL tablet Place 1 tablet (0.4 mg total) under the tongue every 5 (five) minutes as needed for chest pain. 12/17/12  Yes Rhetta Mura, MD  ranitidine (ZANTAC) 150 MG tablet Take 1 tablet (150 mg total) by mouth at bedtime. 01/03/13  Yes Corwin Levins, MD  risperiDONE (RISPERDAL) 0.5 MG tablet Take 1 tablet (0.5 mg total) by  mouth 2 (two) times daily. 06/10/13  Yes Corwin Levins, MD  sertraline (ZOLOFT) 100 MG tablet Take 1 tablet (100 mg total) by mouth daily. 01/03/13  Yes Corwin Levins, MD  traMADol (ULTRAM) 50 MG tablet Take 1 tablet (50 mg total) by mouth every 8 (eight) hours as needed for pain. 12/20/12  Yes Corwin Levins, MD     History   Social History  . Marital Status: Married    Spouse Name: N/A    Number of Children: 2  .  Years of Education: N/A   Occupational History  . 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 Smoking status: Current Every Day Smoker -- 1.00 packs/day for 60 years    Types: Cigarettes  . Smokeless tobacco: Never Used     Comment: 1 ppd  . Alcohol Use: No  . Drug Use: No  . Sexual Activity: Not on file   Other Topics Concern  . Not on file   Social History Narrative   Daily caffeine use 1/2 decaf 1/2 caffeine coffee a day    Family Status  Relation Status Death Age  . Mother Deceased   . Father Deceased   . Sister Deceased     stroke   Family History  Problem Relation Age of Onset  . Colon cancer Mother 33  . Heart disease Father   . Cancer Father   . Heart disease Sister   . Prostate cancer Brother     prostate cancer  . Stroke Sister      ROS:  Unable to obtain due to the patient's condition.  Physical Exam: Blood pressure 87/50, pulse 89, temperature 98 F (36.7 C), temperature source Axillary, resp. rate 16, height 6' (1.829 m), weight 183 lb 6.8 oz (83.2 kg), SpO2 100.00%.  General: Chronically ill appearing male in no acute distress. On ventilator. Lungs: Crackles at the bases, mechanical breath sounds.  Heart: HRRR S1 S2, no rub/gallop, cannot feel pedal pulses.    Neck: No carotid bruits. No lymphadenopathy.  No JVD. Abdomen: Bowel sounds present, abdomen soft and non-tender without masses or hernias noted. Extremities: No clubbing or cyanosis.  No edema. Diminished DP pulses equally and  bilaterally Neuro: Alert but sedated, following some commands.  No focal deficits noted.  Labs:   Lab Results  Component Value Date   WBC 13.0* 06/18/2013   HGB 11.2* 06/18/2013   HCT 31.0* 06/18/2013   MCV 86.4 06/18/2013   PLT 194 06/18/2013   No results found for this basename: INR,  in the last 72 hours  Recent Labs Lab 06/19/13 0357  NA 145  K 3.4*  CL 109  CO2 27  BUN 38*  CREATININE 1.08  CALCIUM 8.7  PROT 5.3*  BILITOT 0.4  ALKPHOS 143*  ALT 11  AST 58*  GLUCOSE 96  ALBUMIN 2.3*   Magnesium  Date Value Range Status  06/18/2013 1.4* 1.5 - 2.5 mg/dL Final    Recent Labs  16/10/96 0230 06/17/13 0554 06/17/13 0950 06/17/13 1545 06/18/13 0417  CKTOTAL  --   --   --   --  80  TROPONINI <0.30 <0.30 <0.30 0.34*  --    No results found for this basename: TROPIPOC,  in the last 72 hours Pro B Natriuretic peptide (BNP)  Date/Time Value Range Status  12/23/2012  8:45 PM 1188.0* 0 - 450 pg/mL Final  11/15/2008  1:50 PM 183.0* 0.0 - 100.0 pg/mL Final   Lab Results  Component Value Date   CHOL 144 05/14/2012   HDL 42.40 05/14/2012   LDLCALC 85 05/14/2012   TRIG 85.0 05/14/2012   Lab Results  Component Value Date   DDIMER 2.04* 06/18/2013   Lipase  Date/Time Value Range Status  12/23/2012  8:45 PM 22  11 - 59 U/L Final     Amylase  Date/Time Value Range Status  09/19/2008  3:45 AM 81  27 - 131 U/L Final   TSH  Date/Time Value Range  Status  05/14/2012 11:24 AM 1.39  0.35 - 5.50 uIU/mL Final   Vitamin B-12  Date/Time Value Range Status  01/03/2010  3:52 PM 137* 211-911 pg/mL Final     Folate  Date/Time Value Range Status  01/03/2010  3:52 PM 5.5   Final     See lab report for associated comment(s)     Ferritin  Date/Time Value Range Status  12/30/2009  9:17 AM 17* 22 - 322 ng/mL Final     TIBC  Date/Time Value Range Status  12/30/2009  9:17 AM 337  215 - 435 ug/dL Final     Iron  Date/Time Value Range Status  01/30/2011  2:22 PM 78  42 - 165 ug/dL  Final     Retic Ct Pct  Date/Time Value Range Status  12/30/2009  9:17 AM 1.1  0.4 - 3.1 % Final    Echo: Pending  ECG:  06/19/13, 9:10: Wide QRS, RBBB, T wave inversion in the inferiolateral leads.   Radiology:  Portable Chest Xray  06/19/2013   CLINICAL DATA:  Status post intubation of the trachea  EXAM: PORTABLE CHEST - 1 VIEW  COMPARISON:  Dec 23, 2012.  FINDINGS: The endotracheal tube tip lies approximately 2 cm below the inferior margin of the clavicular heads and approximately 4 cm above the carina. There is an esophagogastric tube present whose tip is not clearly below the hemidiaphragms. It may have coiled upon itself. Given that there is a subtle linear density projecting over the lower heart border.  The left lung appears mildly hyperinflated. On the right. Inflation is not quite is grade in their coarse lung markings suggesting fibrosis or atelectasis. The cardiac silhouette is not enlarged. The pulmonary vascularity is not engorged. There is calcification in the wall of the tortuous ascending and descending thoracic aorta.  IMPRESSION: 1. The endotracheal tube tip appears to be in reasonable position approximately 4 cm above the Carina.  2. Positioning of the esophagogastric tube is questionable. A lower thoracic, upper abdominal film would be useful to assure that the tip and proximal port of the nasogastric tube lie below the level of the GE junction.  3. Increased interstitial density at the right lung base is in part chronic but superimposed acute atelectasis or early interstitial pneumonia is not excluded.  4.  There is no evidence of CHF.   Electronically Signed   By: David  [place]   On: 06/19/2013 10:24    ASSESSMENT AND PLAN:    Active Problems:   Unspecified Iron Deficiency Anemia   CORONARY ARTERY DISEASE   RENAL ARTERY STENOSIS   COPD   HTN (hypertension)   Acute renal failure   Diarrhea   Metabolic acidosis   Rhabdomyolysis   Hypotension   Transaminitis  1. End  stage CAD, VT, new RBBB: Concerning for myocardial ischemia likely multifactorial secondary to recent illness/hypovolemia as well electrolyte derangements due to recent diarrhea as well as severe CAD.  Surgical intervention for his severe CAD is not an option and we will continue optimizing his medical management. His prognosis is poor but at this time his family elects to keep his as Full Code.   -Continue K and Mg repletion and bicarb drip (32ml/hr, judicious fluids) -2D echo  -Continue amiodarone at 60mg /hr.  -Phenylephrine drip for BP control, wean as tolerated.  -Appreciate PCCM's code status discussion with the family.   Signed: Ky Barban, MD 06/19/2013 10:47 AM   Co-Sign MD  Patient seen with resident, agree with  the above note.   78 yo with history of severe, unrevascularizable CAD and PAD was admitted with diarrhea, dehydration, and AKI.  He may have norovirus per notes.  Today, he was noted to go into a wide complex tachycardia that may have initially been atrial fibrillation/flutter but that appears to have degenerated into VT.  He was defibrillated, intubated, and brought to the CCU.  In the CCU, rhythm initially appeared to be slow atrial fibrillation.  He degenerated again into ventricular fibrillation and was again defibrillated (with CPR).  He is currently in NSR.  He intubated but awake and able to communicate.   I suspect that the immediate trigger for the arrhythmias was severe hypomagnesemia and hypokalemia in the setting of profuse diarrhea.  However, he has severe underlying unrevascularizable CAD and likely becomes ischemic with only mild trigger (such as mild hypotension).  This likely is a major contributor as well.   Suggest:  - Replete K and Mg aggressively.  Repeat BMET/Mg.  Will be on HCO3 gtt for now with acidemia s/p vfib/code.  - Continue amiodarone at 60 mg/hr and phenylephrine for BP control.  - There is really no option for further management of  his CAD other than medical management.   - Echocardiogram.  - If he has further ventricular arrhythmias, would add lidocaine gtt - Overall poor prognosis.  Will begin code discussions with family.   Marca Ancona 06/19/2013

## 2013-06-19 NOTE — Plan of Care (Signed)
CTSP due to cardiac arrest. MD and myself arrived to bedside to find pt in VT and unresponsive. This was in setting of hypokalemia, hypomagnesemia, and hypophosphatemia (see previous note per myself from this am)  - defibrillated x 1 upon which pt awakened but V-tach continued - Was given IV Amiodarone, Epinephrine, Versed and Fentanyl and intubated - Eventually converted to an organized rhythm with a pulse and BP improved.  - PCCM paged and Dr. Vassie Loll arrived and assumed care of the pt. - Pt with h/o CAD and new RBBB so [facility] consulted.  Junious Silk, ANP  I have examined the patient, reviewed the chart and modified the above note which I agree with.   Sreya Froio,MD [phone] 06/21/2013, 10:04 AM

## 2013-06-19 NOTE — Progress Notes (Signed)
Met with family of patient particularly wife and daughter. Provided emotional and spiritual support. They spoke of pt. Being through a lot over the years with cardiac procedures. They said that he did not like coming to the hospital and had delayed this time as he worries they will " Keep me. "  Their worry and fear were apparent.  They said their pastor was called and is on his way to support them.  Dyke Maes

## 2013-06-19 NOTE — Consult Note (Signed)
PULMONARY  / CRITICAL CARE MEDICINE  Name: [name] MRN: [id number] DOB: 1935/07/23    ADMISSION DATE:  06/16/2013 CONSULTATION DATE:  06/19/13  REFERRING MD :  Dr. Butler Denmark PRIMARY SERVICE: PCCM  CHIEF COMPLAINT:  Vtach cardiac arrest  BRIEF PATIENT DESCRIPTION: 78 y.o. male admitted by [facility] for diarrhea due to suspected Norovirus and AKI went into V tach cardiac arrest in the setting of hypokalemia 3.4/ hypomagnesemia 1.4.  Total ACLS time 6 min.  Defibrillated twice and received 150 of amiodarone.  SIGNIFICANT EVENTS / STUDIES:  11/5 admitted for diarrhea and AKI 11/6 Vtach arrest 6 minutes, 2 defibrillations, 150 amiodarone 11/6 intubated 11/6 transferred to 2H  LINES / TUBES: 11/6 ETT >> 11/6 OGT>>  CULTURES: 11/4 Blood x2>> neg 11/4 C Dif >> neg  ANTIBIOTICS:   HISTORY OF PRESENT ILLNESS:  78 y.o. Male with h/o CAD, renal artery stenosis, PAD, HTN, h/o CVA, and suspected COPD presented to [facility] with 2 days periumbilical pain and diarrhea.  Patient was found to have AKI with SCr of 4.06 on admission.  Patient was found to have negative troponinsx3 on admission.  On 11/6 he was found to have right bundle branch block not found on EKG in 12/2012.  He subsequently went into V- tach cardiac arrest and was defibrillated twice and received 150 mg of amiodarone and converted back to bradycardia with RBB.  Patient was transferred to ICU.  Patient is followed by Dr. Clifton James.  He has a history of end stage CAD with few options for revascularization.    PAST MEDICAL HISTORY :  Past Medical History  Diagnosis Date  . CEREBROVASCULAR ACCIDENT, HX OF 03/02/2007  . CHEST PAIN 01/31/2010  . CHRONIC OBSTRUCTIVE PULMONARY DISEASE, ACUTE EXACERBATION 02/18/2009  . COPD 03/02/2007  . CORONARY ARTERY DISEASE 03/02/2007  . DEPRESSION 09/23/2007    anxiety  . DISEASE, PERIPHERAL VASCULAR NEC 03/02/2007  . DIVERTICULOSIS, COLON 09/23/2007  . FATIGUE 01/22/2009  . GERD 03/02/2007  . GLUCOSE INTOLERANCE  09/23/2007  . HEMORRHOIDS 10/09/2007  . HIATAL HERNIA 10/09/2007  . HYPERCHOLESTEROLEMIA 10/09/2007  . HYPERTENSION 09/23/2007  . HYPOKALEMIA 10/09/2007  . MESENTERIC VASCULAR INSUFFICIENCY 07/06/2008  . MITRAL REGURGITATION 11/27/2008  . MI 10/09/2007  . Other specified forms of hearing loss 08/23/2010  . PERIPHERAL VASCULAR DISEASE 09/23/2007  . PUD 10/09/2007  . RENAL ARTERY STENOSIS 03/02/2007  . TOBACCO ABUSE 04/20/2010  . 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 Unspecified Iron Deficiency Anemia 09/23/2007  . URINARY RETENTION 01/22/2009  . VITAMIN B12 DEFICIENCY 01/03/2010  . WRIST PAIN, RIGHT 06/06/2010  . Impaired glucose tolerance 01/29/2011  . Hearing loss in left ear 01/30/2011  . Blindness of both eyes 01/30/2011  . Hyperlipidemia   . Anemia, iron deficiency   . Atrial flutter   . HTN (hypertension) 03/21/2011  . Peripheral arterial disease    Past Surgical History  Procedure Laterality Date  . Coronary artery bypass graft  1995    LIMA-LAD, SVG-OM, SVG-D, SVG-PDA  . Renal artery stenting  2004    bilateral  . S/p multiple le vascular bypass      includine fem-pop x 2 LLE  . S/p bilateral cea    . Coronary angioplasty with stent placement  2010    DES x 2 SVG-OM, cutting balloon PTCA SVG-PDA for ISR  . Appendectomy    . S/p lumbar disc surgury      x2  . S/p rle fem bypass feb 2011    . Cardiac  catheterization    . Pr vein bypass graft,aorto-fem-pop    . Carotid endarterectomy    . Coronary angioplasty with stent placement  2011    DES to SVG-DIAG, SVG-PDA  . Esophagogastroduodenoscopy    . Colonoscopy     Prior to Admission medications   Medication Sig Start Date End Date Taking? Authorizing Provider  amLODipine (NORVASC) 10 MG tablet Take 10 mg by mouth daily.   Yes Historical Provider, MD  aspirin 81 MG EC tablet Take 81 mg by mouth daily.     Yes Historical Provider, MD  atorvastatin (LIPITOR) 40 MG tablet Take 40 mg by mouth daily.   Yes Historical Provider, MD   cilostazol (PLETAL) 100 MG tablet Take 100 mg by mouth 2 (two) times daily.   Yes Historical Provider, MD  clopidogrel (PLAVIX) 75 MG tablet Take 75 mg by mouth daily.   Yes Historical Provider, MD  esomeprazole (NEXIUM) 40 MG capsule Take 1 capsule (40 mg total) by mouth daily. 12/20/12  Yes Corwin Levins, MD  ferrous sulfate 325 (65 FE) MG tablet Take 325 mg by mouth 3 (three) times daily.     Yes Historical Provider, MD  fexofenadine (ALLEGRA) 180 MG tablet Take 180 mg by mouth daily.   Yes Historical Provider, MD  isosorbide mononitrate (IMDUR) 60 MG 24 hr tablet Take 60 mg by mouth daily.   Yes Historical Provider, MD  lisinopril (PRINIVIL,ZESTRIL) 10 MG tablet Take 10 mg by mouth daily.   Yes Historical Provider, MD  metoprolol (LOPRESSOR) 100 MG tablet Take 1 tablet (100 mg total) by mouth 2 (two) times daily. 01/08/13  Yes Kathleene Hazel, MD  nitroGLYCERIN (NITROSTAT) 0.4 MG SL tablet Place 1 tablet (0.4 mg total) under the tongue every 5 (five) minutes as needed for chest pain. 12/17/12  Yes Rhetta Mura, MD  ranitidine (ZANTAC) 150 MG tablet Take 1 tablet (150 mg total) by mouth at bedtime. 01/03/13  Yes Corwin Levins, MD  risperiDONE (RISPERDAL) 0.5 MG tablet Take 1 tablet (0.5 mg total) by mouth 2 (two) times daily. 06/10/13  Yes Corwin Levins, MD  sertraline (ZOLOFT) 100 MG tablet Take 1 tablet (100 mg total) by mouth daily. 01/03/13  Yes Corwin Levins, MD  traMADol (ULTRAM) 50 MG tablet Take 1 tablet (50 mg total) by mouth every 8 (eight) hours as needed for pain. 12/20/12  Yes Corwin Levins, MD   No Known Allergies  FAMILY HISTORY:  Family History  Problem Relation Age of Onset  . Colon cancer Mother 3  . Heart disease Father   . Cancer Father   . Heart disease Sister   . Prostate cancer Brother     prostate cancer  . Stroke Sister    SOCIAL HISTORY:  reports that he has been smoking Cigarettes.  He has a 60 pack-year smoking history. He has never used smokeless  tobacco. He reports that he does not drink alcohol or use illicit drugs.  REVIEW OF SYSTEMS:  Unable to perform  SUBJECTIVE:   VITAL SIGNS: Temp:  [97.7 F (36.5 C)-98.8 F (37.1 C)] 98.7 F (37.1 C) (11/06 0730) Pulse Rate:  [81-102] 102 (11/06 0730) Resp:  [19-22] 19 (11/05 2317) BP: (102-124)/(43-54) 124/54 mmHg (11/06 0730) SpO2:  [90 %-96 %] 93 % (11/06 0730) HEMODYNAMICS:   VENTILATOR SETTINGS:   INTAKE / OUTPUT: Intake/Output     11/05 0701 - 11/06 0700 11/06 0701 - 11/07 0700   P.O. 120    I.V. (  mL/kg) 2925 (37)    IV Piggyback     Total Intake(mL/kg) 3045 (38.5)    Urine (mL/kg/hr) 925 (0.5)    Total Output 925     Net +2120          Stool Occurrence 4 x      PHYSICAL EXAMINATION: General:  Sedated on vent Neuro:  Sedated on vent HEENT:  PERL, MM moist, scars over neck Cardiovascular:  Huston Foley, S1S2, bruits found in carotids  Lungs:  CTA Abdomen:  +BS, soft Musculoskeletal:  No gross deformities, warm to touch Skin:  Well healed surgical scars found over the neck, groin   LABS:  Recent Labs Lab 06/17/13 0012  06/17/13 0230 06/17/13 0236 06/17/13 0554 06/17/13 0713 06/17/13 0740 06/17/13 0950 06/17/13 1545 06/18/13 0417 06/18/13 1910 06/19/13 0357  HGB 12.1*  --   --   --  12.1*  --   --   --   --  11.2*  --   --   WBC 15.8*  --   --   --  18.4*  --   --   --   --  13.0*  --   --   PLT 233  --   --   --  236  --   --   --   --  194  --   --   NA 126*  --  131*  --   --   --   --   --   --  139  --  145  K 4.2  --  4.1  --   --   --   --   --   --  3.1*  --  3.4*  CL 94*  --  104  --   --   --   --   --   --  104  --  109  CO2 12*  --  11*  --   --   --   --   --   --  19  --  27  GLUCOSE 104*  --  101*  --   --   --   --   --   --  144*  --  96  BUN 138*  --  121*  --   --   --   --   --   --  71*  --  38*  CREATININE 4.06*  --  3.31*  --   --   --   --   --   --  1.50*  --  1.08  CALCIUM 9.0  --  8.5  --   --   --   --   --   --  8.6  --  8.7   MG  --   --   --   --   --   --   --   --   --   --  1.4*  --   PHOS  --   --   --   --   --   --   --   --   --   --  1.3*  --   AST 16  --  12  --   --   --   --   --   --  24  --  58*  ALT 8  --  7  --   --   --   --   --   --  7  --  11  ALKPHOS 182*  --  156*  --   --   --   --   --   --  150*  --  143*  BILITOT 0.3  --  0.2*  --   --   --   --   --   --  0.3  --  0.4  PROT 6.8  --  6.2  --   --   --   --   --   --  5.9*  --  5.3*  ALBUMIN 2.9*  --  2.5*  --   --   --   --   --   --  2.3*  --  2.3*  LATICACIDVEN  --   --   --  0.77  --   --   --   --   --   --   --   --   TROPONINI  --   < > <0.30  --  <0.30  --   --  <0.30 0.34*  --   --   --   PROCALCITON  --   --   --   --   --   --  0.49  --   --   --   --   --   PHART  --   --   --   --   --  7.225*  --   --   --   --   --   --   PCO2ART  --   --   --   --   --  26.3*  --   --   --   --   --   --   PO2ART  --   --   --   --   --  80.0  --   --   --   --   --   --   < > = values in this interval not displayed.  Recent Labs Lab 06/17/13 0425  GLUCAP 105*    CXR: pending  ASSESSMENT / PLAN:  PULMONARY A: Acute respiratory failure secondary to cardiac arrest COPD P:   - vent bundle - SpO2 >92% - CXR in am - SBTs as indicated  CARDIOVASCULAR A:  Vtach cardiac arrest 'End stage' CAD New RBBB - compared to 5/14 PAD Renal artery stenosis P:  - consult to cards - Neo MAP goal >60  - amiodarone drip - ASA - Heparin drip - troponins x3 - stat echo - BLE dopplers - hold hypertensive meds    RENAL A:   AKI on admission - SCr resolved Hypokalemia Hypomagnesemia Metabolic acidosis P:   - Trend Bmet - Replete electrolytes as indicated - monitor SCr and UOP -Bicarb gtt  GASTROINTESTINAL A:   No acute issues P:   - protonix for sup - NPO   HEMATOLOGIC A:  Mild normocytic anemia  P:  - trend cbc - Hgb goals >7    INFECTIOUS A:   Suspected Norovirus P:   - follow fever and WBC curve -  no abx indicated at this time  ENDOCRINE A:   No acute issues  P:   - monitor CBGs - SSI  NEUROLOGIC A:   Acute encephalopathy secondary to hypoperfusion P:   - monitor mental status - INt  sedation protocol - daily WUA  GLOBAL - Multiple discussions with family , they desire full code status  TODAY'S SUMMARY:  78  y.o. Male with h/o CAD, renal artery stenosis, PAD, HTN, h/o CVA, and suspected COPD had witnessed V tach arrest was defibrillated and given amio and returned to bradycardia with RBB block which is new within the past six months.  Patient currently on pressors and is considered end stage CAD not amenable to interventions.  Cards has been consulted for workup with new RBB.  Will order echo.  Dopplers have been ordered.  Low suspicion of PE given presentation.       Terri Piedra [place] Physician Assistant Student  I have personally obtained a history, examined the patient, evaluated laboratory and imaging results, formulated the assessment and plan and placed orders. CRITICAL CARE: The patient is critically ill with multiple organ systems failure and requires high complexity decision making for assessment and support, frequent evaluation and titration of therapies, application of advanced monitoring technologies and extensive interpretation of multiple databases. Critical Care Time devoted to patient care services described in this note is 90 minutes.    Oretha Milch  Pulmonary and Critical Care Medicine [facility] Pager: [phone]  06/19/2013, 9:31 AM

## 2013-06-19 NOTE — Progress Notes (Signed)
Report called to RN pt going to 2H02 via bed with rapid response, RT and RN. Pt intubated and bagged for transport.

## 2013-06-19 NOTE — Progress Notes (Signed)
Chaplain followed up with patient and patient's family. Patient was awake and alert and visiting with daughter. Daughter said family is "better now that pt is better." Chaplain also offered care to pt's wife in waiting area. Chaplain explained that on-call chaplain is here overnight and pt's RN can contact chaplain. Family was appreciative of chaplain's support.   Maurene Capes, [place] [phone]

## 2013-06-19 NOTE — Progress Notes (Signed)
Dr. Molli Knock in to see pt. Per MD extubate patient. RT notified. Will continue to monitor closely.

## 2013-06-19 NOTE — Progress Notes (Signed)
ICU hyperglycemia order phase 1 sent   Dr. Kalman Shan, M.D., [facility].C.P Pulmonary and Critical Care Medicine Staff Physician [facility] and Critical Care Pager: [phone], If no answer or between  15:00h - 7:00h: call [phone]  06/19/2013 8:19 PM

## 2013-06-19 NOTE — Progress Notes (Addendum)
Granddaughter answered phone this am re: Mr. Royster status. Wife at Dr. Isidore Moos. Will call her. 2nd attempt to call w/update; no answer.

## 2013-06-19 NOTE — Progress Notes (Signed)
Pt bagged via AMBU bag from 3S01 to 2H02 with no complications and placed on vent per Dr. Vassie Loll. RT will continue to monitor.

## 2013-06-19 NOTE — Procedures (Signed)
Central Venous Catheter Insertion Procedure Note [name] [id number] Oct 07, 1934  Procedure: Insertion of Central Venous Catheter Indications: Assessment of intravascular volume, Drug and/or fluid administration and Frequent blood sampling  Procedure Details Consent: Risks of procedure as well as the alternatives and risks of each were explained to the (patient/caregiver).  Consent for procedure obtained. Time Out: Verified patient identification, verified procedure, site/side was marked, verified correct patient position, special equipment/implants available, medications/allergies/relevent history reviewed, required imaging and test results available.  Performed  Maximum sterile technique was used including antiseptics, cap, gloves, gown, hand hygiene, mask and sheet. Skin prep: Chlorhexidine; local anesthetic administered A antimicrobial bonded/coated triple lumen catheter was placed in the right internal jugular vein using the Seldinger technique. Ultrasound guidance used.yes Catheter placed to 16 cm. Blood aspirated via all 3 ports and then flushed x 3. Line sutured x 2 and dressing applied.  Evaluation Blood flow good Complications: No apparent complications Patient did tolerate procedure well. Chest X-ray ordered to verify placement.  CXR: pending.  Brett Canales Minor ACNP [facility] Pollack PCCM Pager [phone] till 3 pm If no answer page [phone]  Ultrasound used for site verification, live visualisation of needle entry & guidewire prior to dilation I was present & supervised procedure, CXR shows CVL in position  Roberto Coltrane V.  06/19/2013, 11:07 AM

## 2013-06-19 NOTE — Progress Notes (Signed)
Chaplain responded to code blue on 3S. Patient had been moved to [facility]. Chaplain found patient's daughter alone in the lobby and very tearful. Chaplain provided emotional support until the rest of the family arrived. At this time, the doctor updated the family. Chaplain moved family to consult room and provided beverages. Patient's pastor arrived. When patient was stabilized, chaplain left. Chaplain followed up later when the patient coded again. At this time, pastor had moved to a different area of the hospital to visit another parishioner. Chaplain went to find pastor and asked him to come back to 2H. Patient coded again and doctor informed family of his very serious condition saying that the life-extending measures they were taking were causing the patient "discomfort." Family wanted medical staff to continue all measures. Chaplain continued to provide support and served as liaison until patient had stabilized. Chaplain will follow up as necessary.

## 2013-06-19 NOTE — Plan of Care (Signed)
AM labs reviewed. Mg and phos low so will replete IV. D dimer checked 11/5 due to persistent tachycardia and new RBBB- this was elevated so will check lower extremity duplex to r/o DVT- may also need ECHO and CT Chest (renal function nearly to baseline)  Junious Silk, ANP

## 2013-06-20 ENCOUNTER — Inpatient Hospital Stay (HOSPITAL_COMMUNITY): Payer: Medicare Other

## 2013-06-20 DIAGNOSIS — Z48812 Encounter for surgical aftercare following surgery on the circulatory system: Secondary | ICD-10-CM

## 2013-06-20 DIAGNOSIS — I219 Acute myocardial infarction, unspecified: Secondary | ICD-10-CM

## 2013-06-20 DIAGNOSIS — J9600 Acute respiratory failure, unspecified whether with hypoxia or hypercapnia: Secondary | ICD-10-CM

## 2013-06-20 LAB — BASIC METABOLIC PANEL
BUN: 17 mg/dL (ref 6–23)
BUN: 11 mg/dL (ref 6–23)
CO2: 28 mEq/L (ref 19–32)
Calcium: 7.8 mg/dL — ABNORMAL LOW (ref 8.4–10.5)
Calcium: 7.8 mg/dL — ABNORMAL LOW (ref 8.4–10.5)
Calcium: 7.9 mg/dL — ABNORMAL LOW (ref 8.4–10.5)
Chloride: 105 mEq/L (ref 96–112)
Chloride: 103 mEq/L (ref 96–112)
Chloride: 102 mEq/L (ref 96–112)
Creatinine, Ser: 0.84 mg/dL (ref 0.50–1.35)
Creatinine, Ser: 0.82 mg/dL (ref 0.50–1.35)
Creatinine, Ser: 0.83 mg/dL (ref 0.50–1.35)
GFR calc Af Amer: >90 mL/min (ref >90)
GFR calc Af Amer: >90 mL/min (ref >90)
GFR calc non Af Amer: 83 mL/min — ABNORMAL LOW (ref >90)
GFR calc non Af Amer: 84 mL/min — ABNORMAL LOW (ref >90)
GFR calc non Af Amer: 83 mL/min — ABNORMAL LOW (ref >90)

## 2013-06-20 LAB — GLUCOSE, CAPILLARY
Glucose-Capillary: 103 mg/dL — ABNORMAL HIGH (ref 70–99)
Glucose-Capillary: 117 mg/dL — ABNORMAL HIGH (ref 70–99)

## 2013-06-20 LAB — TROPONIN I
Troponin I: 5.46 ng/mL (ref <0.30)
Troponin I: 3.68 ng/mL (ref <0.30)
Troponin I: 3.53 ng/mL (ref <0.30)

## 2013-06-20 LAB — MAGNESIUM: Magnesium: 1.8 mg/dL (ref 1.5–2.5)

## 2013-06-20 MED ORDER — MAGNESIUM SULFATE 40 MG/ML IJ SOLN
2.0000 g | Freq: Once | INTRAMUSCULAR | Status: AC
  Administered 2013-06-20: 2 g via INTRAVENOUS
  Filled 2013-06-20: qty 50

## 2013-06-20 MED ORDER — POTASSIUM PHOSPHATE DIBASIC 3 MMOLE/ML IV SOLN
30.0000 mmol | Freq: Once | INTRAVENOUS | Status: AC
  Administered 2013-06-20: 30 mmol via INTRAVENOUS
  Filled 2013-06-20: qty 10

## 2013-06-20 MED ORDER — POTASSIUM CHLORIDE 10 MEQ/50ML IV SOLN
10.0000 meq | INTRAVENOUS | Status: AC
  Administered 2013-06-20 (×4): 10 meq via INTRAVENOUS
  Filled 2013-06-20 (×5): qty 50

## 2013-06-20 MED ORDER — CARVEDILOL 3.125 MG PO TABS
3.1250 mg | ORAL_TABLET | Freq: Two times a day (BID) | ORAL | Status: DC
  Administered 2013-06-20 – 2013-06-28 (×16): 3.125 mg via ORAL
  Filled 2013-06-20 (×20): qty 1

## 2013-06-20 MED ORDER — BIOTENE DRY MOUTH MT LIQD
15.0000 mL | Freq: Two times a day (BID) | OROMUCOSAL | Status: DC
  Administered 2013-06-20 – 2013-06-28 (×14): 15 mL via OROMUCOSAL

## 2013-06-20 MED ORDER — ENOXAPARIN SODIUM 40 MG/0.4ML ~~LOC~~ SOLN
40.0000 mg | SUBCUTANEOUS | Status: DC
  Administered 2013-06-20 – 2013-06-27 (×8): 40 mg via SUBCUTANEOUS
  Filled 2013-06-20 (×9): qty 0.4

## 2013-06-20 MED FILL — Medication: Qty: 1 | Status: AC

## 2013-06-20 NOTE — Progress Notes (Signed)
eLink Physician-Brief Progress Note Patient Name: [name] DOB: [birth year] MRN: [id number]  Date of Service  06/20/2013   HPI/Events of Note     eICU Interventions  KCl ordered   Intervention Category Intermediate Interventions: Electrolyte abnormality - evaluation and management  Harshitha Fretz S. 06/20/2013, 1:30 AM

## 2013-06-20 NOTE — Consult Note (Signed)
CARDIOLOGY CONSULT NOTE   Patient ID: DENO SIDA MRN: [id number] DOB/AGE: 11/24/1934 78 y.o.  Admit date: 06/16/2013  Primary Physician   [name], MD Primary Cardiologist  Mc Reason for Consultation   VT  Brief HPI: 78 yo with history of severe, unrevascularizable CAD and PAD was admitted with diarrhea, dehydration, and AKI with stool positive for Norovirus. On 11/6 he was noted to go into a wide complex tachycardia that may have initially been atrial fibrillation/flutter but that appears to have degenerated into VT.  He was defibrillated, intubated, and brought to the CCU.  In the CCU, rhythm initially appeared to be slow atrial fibrillation.  He degenerated again into ventricular fibrillation and was again defibrillated (with CPR) but remained on NSR since then.    Subjective:  He denies chest pain, shortness of breath, nausea/vomiting, or diarrhea this morning. He is hungry and asks for "ham and eggs".  He was extubated last night and his heart rate and rhythm remain stable with stable BP.   Past Medical History  Diagnosis Date  . CEREBROVASCULAR ACCIDENT, HX OF 03/02/2007  . CHEST PAIN 01/31/2010  . CHRONIC OBSTRUCTIVE PULMONARY DISEASE, ACUTE EXACERBATION 02/18/2009  . COPD 03/02/2007  . CORONARY ARTERY DISEASE 03/02/2007  . DEPRESSION 09/23/2007    anxiety  . DISEASE, PERIPHERAL VASCULAR NEC 03/02/2007  . DIVERTICULOSIS, COLON 09/23/2007  . FATIGUE 01/22/2009  . GERD 03/02/2007  . GLUCOSE INTOLERANCE 09/23/2007  . HEMORRHOIDS 10/09/2007  . HIATAL HERNIA 10/09/2007  . HYPERCHOLESTEROLEMIA 10/09/2007  . HYPERTENSION 09/23/2007  . HYPOKALEMIA 10/09/2007  . MESENTERIC VASCULAR INSUFFICIENCY 07/06/2008  . MITRAL REGURGITATION 11/27/2008  . MI 10/09/2007  . Other specified forms of hearing loss 08/23/2010  . PERIPHERAL VASCULAR DISEASE 09/23/2007  . PUD 10/09/2007  . RENAL ARTERY STENOSIS 03/02/2007  . TOBACCO ABUSE 04/20/2010  . 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 Unspecified Iron  Deficiency Anemia 09/23/2007  . URINARY RETENTION 01/22/2009  . VITAMIN B12 DEFICIENCY 01/03/2010  . WRIST PAIN, RIGHT 06/06/2010  . Impaired glucose tolerance 01/29/2011  . Hearing loss in left ear 01/30/2011  . Blindness of both eyes 01/30/2011  . Hyperlipidemia   . Anemia, iron deficiency   . Atrial flutter   . HTN (hypertension) 03/21/2011  . Peripheral arterial disease      Past Surgical History  Procedure Laterality Date  . Coronary artery bypass graft  1995    LIMA-LAD, SVG-OM, SVG-D, SVG-PDA  . Renal artery stenting  2004    bilateral  . S/p multiple le vascular bypass      includine fem-pop x 2 LLE  . S/p bilateral cea    . Coronary angioplasty with stent placement  2010    DES x 2 SVG-OM, cutting balloon PTCA SVG-PDA for ISR  . Appendectomy    . S/p lumbar disc surgury      x2  . S/p rle fem bypass feb 2011    . Cardiac catheterization    . Pr vein bypass graft,aorto-fem-pop    . Carotid endarterectomy    . Coronary angioplasty with stent placement  2011    DES to SVG-DIAG, SVG-PDA  . Esophagogastroduodenoscopy    . Colonoscopy      No Known Allergies  I have reviewed the patient's current medications . antiseptic oral rinse  15 mL Mouth Rinse QID  . aspirin  81 mg Oral Daily  . chlorhexidine  15 mL Mouth Rinse BID  . cilostazol  100 mg Oral BID  . clopidogrel  75 mg Oral Q breakfast  . ferrous sulfate  325 mg Oral TID WC  . insulin aspart  2-6 Units Subcutaneous Q4H  . pantoprazole  40 mg Oral Daily  . pneumococcal 23 valent vaccine  0.5 mL Intramuscular Tomorrow-1000  . potassium & sodium phosphates  2 packet Per NG tube TID WC & HS  . sodium chloride  500 mL Intravenous Once  . sodium chloride  3 mL Intravenous Q12H   . sodium chloride 150 mL/hr at 06/19/13 1000  . sodium chloride 10 mL/hr at 06/19/13 1000  . sodium chloride 10 mL/hr at 06/19/13 1230  . amiodarone (NEXTERONE PREMIX) 360 mg/200 mL dextrose 30 mg/hr (06/20/13 0042)  . fentaNYL infusion  INTRAVENOUS Stopped (06/19/13 2100)  . phenylephrine (NEO-SYNEPHRINE) Adult infusion Stopped (06/19/13 2200)  .  sodium bicarbonate  infusion 1000 mL 20 mL/hr at 06/20/13 4098   acetaminophen, acetaminophen, fentaNYL, nitroGLYCERIN, ondansetron (ZOFRAN) IV, ondansetron  Prior to Admission medications   Medication Sig Start Date End Date Taking? Authorizing Provider  amLODipine (NORVASC) 10 MG tablet Take 10 mg by mouth daily.   Yes Historical Provider, MD  aspirin 81 MG EC tablet Take 81 mg by mouth daily.     Yes Historical Provider, MD  atorvastatin (LIPITOR) 40 MG tablet Take 40 mg by mouth daily.   Yes Historical Provider, MD  cilostazol (PLETAL) 100 MG tablet Take 100 mg by mouth 2 (two) times daily.   Yes Historical Provider, MD  clopidogrel (PLAVIX) 75 MG tablet Take 75 mg by mouth daily.   Yes Historical Provider, MD  esomeprazole (NEXIUM) 40 MG capsule Take 1 capsule (40 mg total) by mouth daily. 12/20/12  Yes Corwin Levins, MD  ferrous sulfate 325 (65 FE) MG tablet Take 325 mg by mouth 3 (three) times daily.     Yes Historical Provider, MD  fexofenadine (ALLEGRA) 180 MG tablet Take 180 mg by mouth daily.   Yes Historical Provider, MD  isosorbide mononitrate (IMDUR) 60 MG 24 hr tablet Take 60 mg by mouth daily.   Yes Historical Provider, MD  lisinopril (PRINIVIL,ZESTRIL) 10 MG tablet Take 10 mg by mouth daily.   Yes Historical Provider, MD  metoprolol (LOPRESSOR) 100 MG tablet Take 1 tablet (100 mg total) by mouth 2 (two) times daily. 01/08/13  Yes Kathleene Hazel, MD  nitroGLYCERIN (NITROSTAT) 0.4 MG SL tablet Place 1 tablet (0.4 mg total) under the tongue every 5 (five) minutes as needed for chest pain. 12/17/12  Yes Rhetta Mura, MD  ranitidine (ZANTAC) 150 MG tablet Take 1 tablet (150 mg total) by mouth at bedtime. 01/03/13  Yes Corwin Levins, MD  risperiDONE (RISPERDAL) 0.5 MG tablet Take 1 tablet (0.5 mg total) by mouth 2 (two) times daily. 06/10/13  Yes Corwin Levins, MD   sertraline (ZOLOFT) 100 MG tablet Take 1 tablet (100 mg total) by mouth daily. 01/03/13  Yes Corwin Levins, MD  traMADol (ULTRAM) 50 MG tablet Take 1 tablet (50 mg total) by mouth every 8 (eight) hours as needed for pain. 12/20/12  Yes Corwin Levins, MD     History   Social History  . Marital Status: Married    Spouse Name: N/A    Number of Children: 2  . Years of Education: N/A   Occupational History  . 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 Smoking status: Current Every Day Smoker -- 1.00 packs/day for 60 years  Types: Cigarettes  . Smokeless tobacco: Never Used     Comment: 1 ppd  . Alcohol Use: No  . Drug Use: No  . Sexual Activity: Not on file   Other Topics Concern  . Not on file   Social History Narrative   Daily caffeine use 1/2 decaf 1/2 caffeine coffee a day    Family Status  Relation Status Death Age  . Mother Deceased   . Father Deceased   . Sister Deceased     stroke   Family History  Problem Relation Age of Onset  . Colon cancer Mother 8  . Heart disease Father   . Cancer Father   . Heart disease Sister   . Prostate cancer Brother     prostate cancer  . Stroke Sister      ROS:  Unable to obtain due to the patient's condition.  Physical Exam: Blood pressure 102/36, pulse 104, temperature 97.4 F (36.3 C), temperature source Axillary, resp. rate 18, height 6' (1.829 m), weight 183 lb 6.8 oz (83.2 kg), SpO2 93.00%.  General: Chronically ill appearing male in no acute distress. Sitting up in bed.  Lungs: Crackles at the bases, no respiratory distress.  Heart: HRRR S1 S2, no rub/gallop, 1+ pedal pulses bilaterally.    Neck: No carotid bruits. No lymphadenopathy.  No JVD. Abdomen: Bowel sounds present, abdomen soft and non-tender without masses or hernias noted. Extremities: No clubbing or cyanosis.  No edema.  Neuro: Alert and oriented x3, conversant, following commands.  No focal deficits  noted.  Labs:   Lab Results  Component Value Date   WBC 13.0* 06/18/2013   HGB 11.2* 06/18/2013   HCT 31.0* 06/18/2013   MCV 86.4 06/18/2013   PLT 194 06/18/2013   No results found for this basename: INR,  in the last 72 hours  Recent Labs Lab 06/19/13 0357  06/19/13 2036  06/20/13 0500  NA 145  < > 140  < > 138  K 3.4*  < > 2.9*  < > 3.4*  CL 109  < > 105  < > 103  CO2 27  < > 25  < > 28  BUN 38*  < > 23  < > 17  CREATININE 1.08  < > 0.89  < > 0.82  CALCIUM 8.7  < > 8.1*  < > 7.8*  PROT 5.3*  --   --   --   --   BILITOT 0.4  --   --   --   --   ALKPHOS 143*  --   --   --   --   ALT 11  --   --   --   --   AST 58*  --   --   --   --   GLUCOSE 96  < > 132*  < > 123*  ALBUMIN 2.3*  --  2.1*  --   --   < > = values in this interval not displayed. Magnesium  Date Value Range Status  06/20/2013 1.8  1.5 - 2.5 mg/dL Final    Recent Labs  45/40/98 1545 06/18/13 0417 06/19/13 1256 06/19/13 2036 06/20/13 0045  CKTOTAL  --  80 211  --   --   CKMB  --   --  16.7*  --   --   TROPONINI 0.34*  --  4.53* 5.16* 5.46*   No results found for this basename: TROPIPOC,  in the last 72 hours Pro B Natriuretic peptide (  BNP)  Date/Time Value Range Status  12/23/2012  8:45 PM 1188.0* 0 - 450 pg/mL Final  11/15/2008  1:50 PM 183.0* 0.0 - 100.0 pg/mL Final   Lab Results  Component Value Date   CHOL 144 05/14/2012   HDL 42.40 05/14/2012   LDLCALC 85 05/14/2012   TRIG 85.0 05/14/2012   Lab Results  Component Value Date   DDIMER 2.04* 06/18/2013   Lipase  Date/Time Value Range Status  12/23/2012  8:45 PM 22  11 - 59 U/L Final     Amylase  Date/Time Value Range Status  09/19/2008  3:45 AM 81  27 - 131 U/L Final   TSH  Date/Time Value Range Status  05/14/2012 11:24 AM 1.39  0.35 - 5.50 uIU/mL Final   Vitamin B-12  Date/Time Value Range Status  01/03/2010  3:52 PM 137* 211-911 pg/mL Final     Folate  Date/Time Value Range Status  01/03/2010  3:52 PM 5.5   Final     See lab report  for associated comment(s)     Ferritin  Date/Time Value Range Status  12/30/2009  9:17 AM 17* 22 - 322 ng/mL Final     TIBC  Date/Time Value Range Status  12/30/2009  9:17 AM 337  215 - 435 ug/dL Final     Iron  Date/Time Value Range Status  01/30/2011  2:22 PM 78  42 - 165 ug/dL Final     Retic Ct Pct  Date/Time Value Range Status  12/30/2009  9:17 AM 1.1  0.4 - 3.1 % Final    Echo: Pending  ECG:  06/19/13, 9:10: Wide QRS, RBBB, T wave inversion in the inferiolateral leads.   Radiology:  Portable Chest Xray In Am  06/20/2013   CLINICAL DATA:  Atelectasis  EXAM: PORTABLE CHEST - 1 VIEW  COMPARISON:  Prior radiograph from 06/19/2013  FINDINGS: Tip of a right IJ central venous catheter projects over the mid SVC. Median sternotomy wires are unchanged. Cardiomegaly is stable. Coarse atherosclerotic calcifications again noted within the aortic arch. Defibrillator pad overlies the lower left chest.  Lung volumes are within normal limits. Bilateral regular interstitial thickening is similar as compared to the prior exam. More linear opacities within the bilateral lung bases, right greater than left are most consistent with atelectasis, slightly increased as compared to the prior exam. No pneumothorax.  Osseous structures are unchanged.  IMPRESSION: Bibasilar atelectasis, right greater than left, slightly increased from prior study from 06/19/2013. Otherwise no significant interval change in appearance of the heart and lungs.   Electronically Signed   By: Rise Mu M.D.   On: 06/20/2013 06:00   Dg Chest Port 1 View  06/19/2013   CLINICAL DATA:  Status post central line placement.  EXAM: PORTABLE CHEST - 1 VIEW  COMPARISON:  06/19/2013  FINDINGS: New right internal jugular central venous line has its tip in the upper aspect of the superior vena cava. No pneumothorax.  Endotracheal tube and nasogastric to are stable in well positioned.  Bilateral irregular interstitial thickening with  right mid and lower lung zone hazy airspace opacity is stable. Changes from cardiac surgery are stable.  IMPRESSION: Right internal jugular central venous line has its tip in the upper aspect of the superior vena cava. No pneumothorax.  No other change from the earlier exam.   Electronically Signed   By: Amie Portland M.D.   On: 06/19/2013 13:24   Portable Chest Xray  06/19/2013   CLINICAL DATA:  Status post intubation  of the trachea  EXAM: PORTABLE CHEST - 1 VIEW  COMPARISON:  Dec 23, 2012.  FINDINGS: The endotracheal tube tip lies approximately 2 cm below the inferior margin of the clavicular heads and approximately 4 cm above the carina. There is an esophagogastric tube present whose tip is not clearly below the hemidiaphragms. It may have coiled upon itself. Given that there is a subtle linear density projecting over the lower heart border.  The left lung appears mildly hyperinflated. On the right. Inflation is not quite is grade in their coarse lung markings suggesting fibrosis or atelectasis. The cardiac silhouette is not enlarged. The pulmonary vascularity is not engorged. There is calcification in the wall of the tortuous ascending and descending thoracic aorta.  IMPRESSION: 1. The endotracheal tube tip appears to be in reasonable position approximately 4 cm above the Carina.  2. Positioning of the esophagogastric tube is questionable. A lower thoracic, upper abdominal film would be useful to assure that the tip and proximal port of the nasogastric tube lie below the level of the GE junction.  3. Increased interstitial density at the right lung base is in part chronic but superimposed acute atelectasis or early interstitial pneumonia is not excluded.  4.  There is no evidence of CHF.   Electronically Signed   By: David  [place]   On: 06/19/2013 10:24    ASSESSMENT AND PLAN:    Active Problems:   Unspecified Iron Deficiency Anemia   CORONARY ARTERY DISEASE   RENAL ARTERY STENOSIS   COPD   HTN  (hypertension)   Acute renal failure   Diarrhea   Metabolic acidosis   Rhabdomyolysis   Hypotension   Transaminitis   Cardiac arrest   Acute respiratory failure with hypoxia   Ventricular tachycardia  1. End stage CAD, VT, new RBBB: Concerning for myocardial ischemia likely multifactorial secondary to recent illness/hypovolemia as well electrolyte derangements due to recent diarrhea as well as severe CAD.  Surgical intervention for his severe CAD is not an option and we will continue optimizing his medical management. He has made an unremarkable recovery so far and is now hemodynamically stable. 2D echo with EF of 35-40% which could be indicative of transient cardiomyopathy secondary to ischemia.  -Continue K and Mg repletion.  -Continue amiodarone drip given recent VT.    Signed: Ky Barban, MD IM, PGY-II 06/20/2013 7:46 AM  Attending Note:   The patient was seen and examined.  Agree with assessment and plan as noted above.  Changes made to the above note as needed.  Mr. Koelzer is feeling much better.  He has a hx of severe CAD that is not amenable to revascualrization.  He developed a Norovirus infection and became very volume depleted and had markedly abnormal electrolytes.     He had signifncant arrhythmias including VT and possible   Afib with RVR and aberrant conduction.  I have reviewed the ECGs and I'm not sure that I would start anticoagulation at this point.  If he has clear cut Afib NOT associated with profound electrolyte abnormalities, I would definitely start coumadin or NOAC.   After IVF resuscitation and IV amio, his rhythm has stabilized.  He is feeling much better.  OK to transfer out of CCu today from our standpoint.   There is a question of Afib in the past - would keep him on amiodarone for at least several months - perhaps longer.  Will have him follow up with Dr. Clifton James in the office  Vesta Mixer, [name]., MD, [facility] 06/20/2013, 9:06 AM

## 2013-06-20 NOTE — Consult Note (Addendum)
PULMONARY  / CRITICAL CARE MEDICINE  Name: [name] MRN: [id number] DOB: Mar 13, 1935    ADMISSION DATE:  06/16/2013 CONSULTATION DATE:  06/19/13  REFERRING MD :  [facility] PRIMARY SERVICE: PCCM  CHIEF COMPLAINT:  Vtach cardiac arrest  BRIEF PATIENT DESCRIPTION: 78 yo admitted by [facility] for suspected Norovirus and AKI who developed VT cardiac arrest in the setting of hypokalemia 3.4/ hypomagnesemia 1.4.  Total ACLS time 6 min.  Defibrillated twice and received Amiodarone 150 x 1. Intubated.  SIGNIFICANT EVENTS / STUDIES:  11/5  Admitted for diarrhea and AKI 11/6  VT arrest 6 minutes, 2 defibrillations, 150 amiodarone, intubated 11/6  Extubated  LINES / TUBES: 11/6 ETT >>> 11/6 11/6 OGT>>> 11/6  CULTURES: 11/4 Blood >>> neg  ANTIBIOTICS:  SUBJECTIVE: Complains of being nauseous.    VITAL SIGNS: Temp:  [97.4 F (36.3 C)-98.9 F (37.2 C)] 97.4 F (36.3 C) (11/07 0400) Pulse Rate:  [50-112] 112 (11/07 1000) Resp:  [13-25] 19 (11/07 1000) BP: (99-159)/(30-54) 102/36 mmHg (11/07 0000) SpO2:  [90 %-100 %] 91 % (11/07 1000) Arterial Line BP: (98-143)/(38-54) 113/42 mmHg (11/07 1000) FiO2 (%):  [50 %] 50 % (11/06 2200)  HEMODYNAMICS:   VENTILATOR SETTINGS: Vent Mode:  [-] PRVC FiO2 (%):  [50 %] 50 % Set Rate:  [16 bmp] 16 bmp Vt Set:  [620 mL] 620 mL PEEP:  [5 cmH20] 5 cmH20 Plateau Pressure:  [14 cmH20-16 cmH20] 16 cmH20  INTAKE / OUTPUT: Intake/Output     11/06 0701 - 11/07 0700 11/07 0701 - 11/08 0700   P.O.     I.V. (mL/kg) 7074.7 (85) 110.1 (1.3)   IV Piggyback 1466.7    Total Intake(mL/kg) 8541.4 (102.7) 110.1 (1.3)   Urine (mL/kg/hr) 1550 (0.8) 250 (0.7)   Total Output 1550 250   Net +6991.4 -139.9        Stool Occurrence 1 x      PHYSICAL EXAMINATION: General:  No distress Neuro:  Awake, alert, cooperative HEENT:  PERRL Cardiovascular:  Regular, no murmurs Lungs:  Clear to auscultation bilaterally Abdomen:  Soft, bowel sounds present Musculoskeletal:   Moves all extremities Skin:  Intact  LABS:  Recent Labs Lab 06/17/13 0012  06/17/13 0230 06/17/13 0236 06/17/13 0554 06/17/13 0713 06/17/13 0740  06/18/13 0417  06/19/13 0357 06/19/13 1140 06/19/13 1254 06/19/13 1256 06/19/13 2035 06/19/13 2036 06/20/13 0045 06/20/13 0500  HGB 12.1*  --   --   --  12.1*  --   --   --  11.2*  --   --   --   --   --   --   --   --   --   WBC 15.8*  --   --   --  18.4*  --   --   --  13.0*  --   --   --   --   --   --   --   --   --   PLT 233  --   --   --  236  --   --   --  194  --   --   --   --   --   --   --   --   --   NA 126*  --  131*  --   --   --   --   --  139  --  145  --  140  --   --  140 140 138  K  4.2  --  4.1  --   --   --   --   --  3.1*  --  3.4*  --  3.5  --   --  2.9* 3.0* 3.4*  CL 94*  --  104  --   --   --   --   --  104  --  109  --  106  --   --  105 105 103  CO2 12*  --  11*  --   --   --   --   --  19  --  27  --  21  --   --  25 27 28   GLUCOSE 104*  --  101*  --   --   --   --   --  144*  --  96  --  200*  --   --  132* 125* 123*  BUN 138*  --  121*  --   --   --   --   --  71*  --  38*  --  29*  --   --  23 20 17   CREATININE 4.06*  --  3.31*  --   --   --   --   --  1.50*  --  1.08  --  1.03  --   --  0.89 0.84 0.82  CALCIUM 9.0  --  8.5  --   --   --   --   --  8.6  --  8.7  --  7.7*  --   --  8.1* 7.8* 7.8*  MG  --   --   --   --   --   --   --   --   --   < >  --   --  2.3  --  1.8  --   --  1.8  PHOS  --   --   --   --   --   --   --   --   --   < >  --   --   --   --  1.4* 1.3*  --  2.2*  AST 16  --  12  --   --   --   --   --  24  --  58*  --   --   --   --   --   --   --   ALT 8  --  7  --   --   --   --   --  7  --  11  --   --   --   --   --   --   --   ALKPHOS 182*  --  156*  --   --   --   --   --  150*  --  143*  --   --   --   --   --   --   --   BILITOT 0.3  --  0.2*  --   --   --   --   --  0.3  --  0.4  --   --   --   --   --   --   --   PROT 6.8  --  6.2  --   --   --   --   --  5.9*  --  5.3*  --   --    --   --   --   --   --  ALBUMIN 2.9*  --  2.5*  --   --   --   --   --  2.3*  --  2.3*  --   --   --   --  2.1*  --   --   LATICACIDVEN  --   --   --  0.77  --   --   --   --   --   --   --   --   --   --   --   --   --   --   TROPONINI  --   < > <0.30  --  <0.30  --   --   < >  --   --   --   --   --  4.53*  --  5.16* 5.46*  --   PROCALCITON  --   --   --   --   --   --  0.49  --   --   --   --   --   --   --   --   --   --   --   PHART  --   --   --   --   --  7.225*  --   --   --   --   --  7.260*  --   --   --   --   --   --   PCO2ART  --   --   --   --   --  26.3*  --   --   --   --   --  42.5  --   --   --   --   --   --   PO2ART  --   --   --   --   --  80.0  --   --   --   --   --  225.0*  --   --   --   --   --   --   < > = values in this interval not displayed.  Recent Labs Lab 06/17/13 0425 06/19/13 2035 06/19/13 2343 06/20/13 0337 06/20/13 0932  GLUCAP 105* 117* 103* 117* 88   CXR:   ASSESSMENT / PLAN:  PULMONARY A:  Acute respiratory failure secondary to cardiac arrest - resolved. P:   Goal SpO2>92 Supplemental oxygen PRN  CARDIOVASCULAR A: VT arrest in setting of multiple electrolyte abnormalities. CAD. New RBBB. PAD. Renal artery stenosis.  Elevated troponin in setting of CPR. P:  Cardiology consulted ASA, Coreg, Pletal, Plavix D/c Neosynephrine Amiodarone gtt Continue trending Troponin  RENAL A:  AKI on admission - resolved. Hypokalemia. Hypomagnesemia. Hypophosphatemia. P:   Trend BMET, next 5 PM D/c Bicarb gtt KPhos 30 x 1 Mg 2 x 1  GASTROINTESTINAL A:  No acute issues. P:   D/c Protonix as extubated / off pressors Heart diet  HEMATOLOGIC A: Mild normocytic anemia. P:  Trend CBC Fe supplements SCDs  INFECTIOUS A:  Suspected Norovirus enteritis. P:   No intervention required  ENDOCRINE A:  Normoglycemic.  P:   D/c SSI  NEUROLOGIC A:  Acute encephalopathy secondary to hypoperfusion - resolved. P:   D/c Fentanyl  Transfer to  SDU under [facility] (Dr.Rizwan accepted 11/8).  PCCM will sign off.  I have personally obtained a history, examined the patient, evaluated laboratory and imaging results, formulated the assessment and plan and placed orders.   Tiandra Swoveland, Adriana Reams  Pulmonary and Critical Care Medicine [facility] Pager: [phone]  06/20/2013, 11:20 AM

## 2013-06-20 NOTE — Progress Notes (Signed)
eLink Physician-Brief Progress Note Patient Name: [name] DOB: February 09, 1935 MRN: [id number]  Date of Service  06/20/2013   HPI/Events of Note     eICU Interventions  KVO bicarb gtt. anticipate it can be d/c'd in the am   Intervention Category Major Interventions: Acid-Base disturbance - evaluation and management  BYRUM,ROBERT S. 06/20/2013, 3:32 AM

## 2013-06-20 NOTE — Progress Notes (Signed)
Left radial art line dc'd. Pressure held for 10 min. Pressure dressing applied. Dressing CDI. Will continue to monitor pt.

## 2013-06-21 DIAGNOSIS — I4891 Unspecified atrial fibrillation: Secondary | ICD-10-CM

## 2013-06-21 DIAGNOSIS — I739 Peripheral vascular disease, unspecified: Secondary | ICD-10-CM

## 2013-06-21 DIAGNOSIS — I472 Ventricular tachycardia: Secondary | ICD-10-CM | POA: Diagnosis present

## 2013-06-21 DIAGNOSIS — Z79899 Other long term (current) drug therapy: Secondary | ICD-10-CM

## 2013-06-21 DIAGNOSIS — A0811 Acute gastroenteropathy due to Norwalk agent: Secondary | ICD-10-CM

## 2013-06-21 DIAGNOSIS — Z951 Presence of aortocoronary bypass graft: Secondary | ICD-10-CM

## 2013-06-21 DIAGNOSIS — I959 Hypotension, unspecified: Secondary | ICD-10-CM

## 2013-06-21 DIAGNOSIS — I7389 Other specified peripheral vascular diseases: Secondary | ICD-10-CM

## 2013-06-21 DIAGNOSIS — I2510 Atherosclerotic heart disease of native coronary artery without angina pectoris: Secondary | ICD-10-CM

## 2013-06-21 DIAGNOSIS — I5022 Chronic systolic (congestive) heart failure: Secondary | ICD-10-CM

## 2013-06-21 DIAGNOSIS — I5020 Unspecified systolic (congestive) heart failure: Secondary | ICD-10-CM | POA: Diagnosis present

## 2013-06-21 LAB — TROPONIN I: Troponin I: 3 ng/mL (ref <0.30)

## 2013-06-21 LAB — CBC
Hemoglobin: 10.8 g/dL — ABNORMAL LOW (ref 13.0–17.0)
MCH: 30.6 pg (ref 26.0–34.0)
MCV: 90.7 fL (ref 78.0–100.0)
Platelets: 180 10*3/uL (ref 150–400)
RBC: 3.53 MIL/uL — ABNORMAL LOW (ref 4.22–5.81)
WBC: 14.4 10*3/uL — ABNORMAL HIGH (ref 4.0–10.5)

## 2013-06-21 LAB — BASIC METABOLIC PANEL
BUN: 10 mg/dL (ref 6–23)
CO2: 26 mEq/L (ref 19–32)
Chloride: 100 mEq/L (ref 96–112)
GFR calc Af Amer: >90 mL/min (ref >90)
GFR calc non Af Amer: 81 mL/min — ABNORMAL LOW (ref >90)
Glucose, Bld: 136 mg/dL — ABNORMAL HIGH (ref 70–99)
Potassium: 4.2 mEq/L (ref 3.5–5.1)

## 2013-06-21 LAB — EXPECTORATED SPUTUM ASSESSMENT W GRAM STAIN, RFLX TO RESP C

## 2013-06-21 LAB — EXPECTORATED SPUTUM ASSESSMENT W REFEX TO RESP CULTURE: Special Requests: NORMAL

## 2013-06-21 MED ORDER — MAGNESIUM SULFATE IN D5W 10-5 MG/ML-% IV SOLN
1.0000 g | Freq: Once | INTRAVENOUS | Status: AC
  Administered 2013-06-21: 1 g via INTRAVENOUS
  Filled 2013-06-21: qty 100

## 2013-06-21 MED ORDER — FENTANYL CITRATE 0.05 MG/ML IJ SOLN
INTRAMUSCULAR | Status: AC
  Filled 2013-06-21: qty 2

## 2013-06-21 MED ORDER — AMIODARONE IV BOLUS ONLY 150 MG/100ML
150.0000 mg | Freq: Once | INTRAVENOUS | Status: AC
  Administered 2013-06-21: 150 mg via INTRAVENOUS

## 2013-06-21 MED ORDER — MIDAZOLAM HCL 2 MG/2ML IJ SOLN
INTRAMUSCULAR | Status: AC
  Filled 2013-06-21: qty 2

## 2013-06-21 NOTE — Progress Notes (Signed)
eLink Physician-Brief Progress Note Patient Name: [name] DOB: Mar 15, 1935 MRN: [id number]  Date of Service  06/21/2013   HPI/Events of Note   Episode of stable Vtach this am.  Cardiology at bedside.  Rhythm broke with 150mg  bolus of amiodarone.    eICU Interventions  Morning electrolytes drawn stat antiarrythmics per cardiology      Deanna Artis 06/21/2013, 4:09 AM

## 2013-06-21 NOTE — Progress Notes (Signed)
    Pt had another episode of tachycardia - likely VT vs. A-fib with his underlying BBB.   The family was present to discuss.  He has been on coumadin in the past and had to DC it due to a GI bleed.   Will not start coumadin or NOAC for his a-fib given this information.  Will continue amiodarone IV for now.   Vesta Mixer, [name]., MD, [facility] 06/21/2013, 1:26 PM Office - [phone] Pager [phone]

## 2013-06-21 NOTE — Progress Notes (Signed)
SUBJECTIVE: Pt denies chest pain, shortness of breath, weakness, and said his appetite varies. Had another bout of diarrhea this morning. Had an episode of VT earlier this am around 4 (185 bpm), which broke with 150 mg IV amiodarone bolus and subsequently developed atrial fibrillation. Upon speaking with his wife, he used to be on warfarin in the past but did not tolerate it due to side effects of bleeding. Currently on an amiodarone drip.     Intake/Output Summary (Last 24 hours) at 06/21/13 1033 Last data filed at 06/21/13 0900  Gross per 24 hour  Intake 1516.03 ml  Output    900 ml  Net 616.03 ml    Current Facility-Administered Medications  Medication Dose Route Frequency Provider Last Rate Last Dose  . 0.9 %  sodium chloride infusion   Intravenous Continuous Oretha Milch, MD 75 mL/hr at 06/21/13 0506    . 0.9 %  sodium chloride infusion   Intravenous Continuous Oretha Milch, MD 10 mL/hr at 06/21/13 0800 10 mL at 06/21/13 0800  . 0.9 %  sodium chloride infusion   Intravenous Continuous Oretha Milch, MD 10 mL/hr at 06/19/13 1230    . acetaminophen (TYLENOL) tablet 650 mg  650 mg Oral Q6H PRN Eduard [name], MD   650 mg at 06/20/13 2053   Or  . acetaminophen (TYLENOL) suppository 650 mg  650 mg Rectal Q6H PRN Eduard [name], MD      . amiodarone (NEXTERONE PREMIX) 360 mg/200 mL dextrose IV infusion  60 mg/hr Intravenous Continuous Ardis Rowan, MD 33.3 mL/hr at 06/21/13 0800 60 mg/hr at 06/21/13 0800  . antiseptic oral rinse (BIOTENE) solution 15 mL  15 mL Mouth Rinse BID Lonia Farber, MD   15 mL at 06/20/13 2055  . aspirin EC tablet 81 mg  81 mg Oral Daily Eduard [name], MD   81 mg at 06/20/13 1610  . carvedilol (COREG) tablet 3.125 mg  3.125 mg Oral BID WC Ky Barban, MD   3.125 mg at 06/20/13 1430  . cilostazol (PLETAL) tablet 100 mg  100 mg Oral BID Eduard [name], MD   100 mg at 06/20/13 2233  . clopidogrel (PLAVIX)  tablet 75 mg  75 mg Oral Q breakfast Eduard [name], MD   75 mg at 06/20/13 0755  . enoxaparin (LOVENOX) injection 40 mg  40 mg Subcutaneous Q24H Vesta Mixer, MD   40 mg at 06/20/13 2053  . ferrous sulfate tablet 325 mg  325 mg Oral TID WC Eduard [name], MD   325 mg at 06/20/13 0754  . magnesium sulfate IVPB 1 g 100 mL  1 g Intravenous Once Calvert Cantor, MD      . nitroGLYCERIN (NITROSTAT) SL tablet 0.4 mg  0.4 mg Sublingual Q5 min PRN Eduard [name], MD      . ondansetron [facility]) tablet 4 mg  4 mg Oral Q6H PRN Eduard [name], MD   4 mg at 06/20/13 1332   Or  . ondansetron (ZOFRAN) injection 4 mg  4 mg Intravenous Q6H PRN Eduard [name], MD   4 mg at 06/20/13 2232  . potassium & sodium phosphates (PHOS-NAK) 280-160-250 MG packet 2 packet  2 packet Per NG tube TID WC & HS Oretha Milch, MD   2 packet at 06/20/13 2237  . sodium chloride 0.9 % bolus 500 mL  500 mL Intravenous Once Russella Dar, NP      .  sodium chloride 0.9 % injection 3 mL  3 mL Intravenous Q12H Eduard [name], MD   3 mL at 06/20/13 2233    Filed Vitals:   06/21/13 0430 06/21/13 0445 06/21/13 0500 06/21/13 0800  BP: 115/52 91/37 106/42 100/46  Pulse: 102 96 96 103  Temp:   98.1 F (36.7 C) 99.5 F (37.5 C)  TempSrc:   Oral Oral  Resp: 17 16 22 23   Height:      Weight:      SpO2: 98% 89% 91% 93%    PHYSICAL EXAM General: NAD Neck: No JVD, no thyromegaly or thyroid nodule.  Lungs: Clear to auscultation bilaterally with normal respiratory effort. CV: Nondisplaced PMI.  Irregular rhythm, normal S1/S2, no S3/S4, no murmur.  No pretibial edema.  No carotid bruit.  Diminished pedal pulses.  Abdomen: Soft, nontender, no hepatosplenomegaly, no distention.  Neurologic: Alert and oriented x 3.  Psych: Normal affect. Extremities: No clubbing or cyanosis.   TELEMETRY: Reviewed telemetry pt in atrial fibrillation.  LABS: Basic Metabolic Panel:  Recent Labs  16/10/96 0500  06/20/13 1815 06/21/13 0406  NA 138 138 137  K 3.4* 3.7 4.2  CL 103 102 100  CO2 28 29 26   GLUCOSE 123* 130* 136*  BUN 17 11 10   CREATININE 0.82 0.83 0.89  CALCIUM 7.8* 7.9* 8.3*  MG 1.8  --  1.7  PHOS 2.2*  --  3.0   Liver Function Tests:  Recent Labs  06/19/13 0357 06/19/13 2036  AST 58*  --   ALT 11  --   ALKPHOS 143*  --   BILITOT 0.4  --   PROT 5.3*  --   ALBUMIN 2.3* 2.1*   No results found for this basename: LIPASE, AMYLASE,  in the last 72 hours CBC:  Recent Labs  06/21/13 0406  WBC 14.4*  HGB 10.8*  HCT 32.0*  MCV 90.7  PLT 180   Cardiac Enzymes:  Recent Labs  06/19/13 1256  06/20/13 1127 06/20/13 1815 06/20/13 2327  CKTOTAL 211  --   --   --   --   CKMB 16.7*  --   --   --   --   TROPONINI 4.53*  < > 3.68* 3.53* 3.00*  < > = values in this interval not displayed. BNP: No components found with this basename: POCBNP,  D-Dimer:  Recent Labs  06/18/13 1533  DDIMER 2.04*   Hemoglobin A1C: No results found for this basename: HGBA1C,  in the last 72 hours Fasting Lipid Panel: No results found for this basename: CHOL, HDL, LDLCALC, TRIG, CHOLHDL, LDLDIRECT,  in the last 72 hours Thyroid Function Tests: No results found for this basename: TSH, T4TOTAL, FREET3, T3FREE, THYROIDAB,  in the last 72 hours Anemia Panel: No results found for this basename: VITAMINB12, FOLATE, FERRITIN, TIBC, IRON, RETICCTPCT,  in the last 72 hours  RADIOLOGY: Ct Abdomen Pelvis Wo Contrast  06/17/2013   CLINICAL DATA:  Abdominal pain, weakness and confusion. Diarrhea.  EXAM: CT ABDOMEN AND PELVIS WITHOUT CONTRAST  TECHNIQUE: Multidetector CT imaging of the abdomen and pelvis was performed following the standard protocol without intravenous contrast.  COMPARISON:  CT of the abdomen and pelvis July 23, 2008.  FINDINGS: Limited view of the lung bases demonstrated dependent atelectasis, minimal pleural thickening. Dense mitral annular calcifications, status post median  sternotomy with cardiac lead in place.  CONTRAST within the included thoracic esophagus and stomach. Multiple loops of prominent small bowel in the measure up to 3.7  cm, without discrete transition point. Fluid filled cecum, large bowel is normal in course and caliber. No pericolonic inflammatory changes. Sigmoid diverticulosis  Liver, spleen, right adrenal gland are unremarkable. Relatively fatty replaced pancreas is otherwise unremarkable. Punctate layering gallstones within the gallbladder. 24 x 19 mm left adrenal nodule, 5 Hounsfield units, consistent with benign adenoma. No intraperitoneal free fluid nor free air.  Kidneys are well located. 3 mm right interpolar calculus was present previously, bilateral vascular calcifications. No new hydronephrosis. Limited assessment for renal masses on this noncontrast examination.  Severe calcific atherosclerosis aorta, with apparent bilateral renal artery stents. Bilateral common iliac artery stents. Urinary bladder is well distended unremarkable. Prostate is nonsuspicious. Patient is osteopenic. Severe L4-5 degenerative disc disease with minimal neural foraminal narrowing.  IMPRESSION: Multiple loops of mildly prominent small bowel throughout the abdomen, with no discrete transition point, this could reflect enteritis with fluid filled cecum. No bowel obstruction. Sigmoid diverticulosis without acute diverticulitis.  Cholelithiasis without CT findings of acute cholecystitis.  Severe calcific atherosclerosis of the aorta with bilateral renal and common iliac artery stents.   Electronically Signed   By: Awilda Metro   On: 06/17/2013 06:48   Portable Chest Xray In Am  06/20/2013   CLINICAL DATA:  Atelectasis  EXAM: PORTABLE CHEST - 1 VIEW  COMPARISON:  Prior radiograph from 06/19/2013  FINDINGS: Tip of a right IJ central venous catheter projects over the mid SVC. Median sternotomy wires are unchanged. Cardiomegaly is stable. Coarse atherosclerotic calcifications  again noted within the aortic arch. Defibrillator pad overlies the lower left chest.  Lung volumes are within normal limits. Bilateral regular interstitial thickening is similar as compared to the prior exam. More linear opacities within the bilateral lung bases, right greater than left are most consistent with atelectasis, slightly increased as compared to the prior exam. No pneumothorax.  Osseous structures are unchanged.  IMPRESSION: Bibasilar atelectasis, right greater than left, slightly increased from prior study from 06/19/2013. Otherwise no significant interval change in appearance of the heart and lungs.   Electronically Signed   By: Rise Mu M.D.   On: 06/20/2013 06:00   Dg Chest Port 1 View  06/19/2013   CLINICAL DATA:  Status post central line placement.  EXAM: PORTABLE CHEST - 1 VIEW  COMPARISON:  06/19/2013  FINDINGS: New right internal jugular central venous line has its tip in the upper aspect of the superior vena cava. No pneumothorax.  Endotracheal tube and nasogastric to are stable in well positioned.  Bilateral irregular interstitial thickening with right mid and lower lung zone hazy airspace opacity is stable. Changes from cardiac surgery are stable.  IMPRESSION: Right internal jugular central venous line has its tip in the upper aspect of the superior vena cava. No pneumothorax.  No other change from the earlier exam.   Electronically Signed   By: Amie Portland M.D.   On: 06/19/2013 13:24   Portable Chest Xray  06/19/2013   CLINICAL DATA:  Status post intubation of the trachea  EXAM: PORTABLE CHEST - 1 VIEW  COMPARISON:  Dec 23, 2012.  FINDINGS: The endotracheal tube tip lies approximately 2 cm below the inferior margin of the clavicular heads and approximately 4 cm above the carina. There is an esophagogastric tube present whose tip is not clearly below the hemidiaphragms. It may have coiled upon itself. Given that there is a subtle linear density projecting over the lower  heart border.  The left lung appears mildly hyperinflated. On the right. Inflation is  not quite is grade in their coarse lung markings suggesting fibrosis or atelectasis. The cardiac silhouette is not enlarged. The pulmonary vascularity is not engorged. There is calcification in the wall of the tortuous ascending and descending thoracic aorta.  IMPRESSION: 1. The endotracheal tube tip appears to be in reasonable position approximately 4 cm above the Carina.  2. Positioning of the esophagogastric tube is questionable. A lower thoracic, upper abdominal film would be useful to assure that the tip and proximal port of the nasogastric tube lie below the level of the GE junction.  3. Increased interstitial density at the right lung base is in part chronic but superimposed acute atelectasis or early interstitial pneumonia is not excluded.  4.  There is no evidence of CHF.   Electronically Signed   By: David  [place]   On: 06/19/2013 10:24      ASSESSMENT AND PLAN: 1. VT: continue aggressive replacement of electrolytes, along with amiodarone drip (currently at 60 mg/hr). Ischemia likely contributing as well, but he has a hx of severe CAD that is not amenable to revascularization. Hence, continue medical management which includes ASA, Plavix, Coreg (cannot increased due to relative hypotension and ongoing diarrhea). Lipitor on hold due to transaminitis from shock liver, which can be restarted in future. 2. Severe CAD: as per #1. 3. Atrial fibrillation: currently on amiodarone and will likely require this for an extended period of time as an outpatient. With regards to anticoagulation, his wife says patient did not tolerate warfarin in the past due to bleeding issues. Currently on ASA, Plavix, and Pletal. This may need to be readdressed in the future, perhaps with regards to a NOAC. However, if atrial fibrillation resolves after current Norovirus enteritis and electrolyte imbalances also resolve, may be able to avoid  anticoagulation. Currently on Lovenox. 4. Chronic systolic heart failure, EF 35-40%: euvolemic at present, and will need to monitor with ongoing fluid loss and replacement necessity. Was on ACEI as outpatient, which needs to be held for relative hypotension at present.   Prentice Docker, M.D., F.A.C.C.

## 2013-06-21 NOTE — Progress Notes (Addendum)
[facility] Progress Note [place] TEAM 1 - Stepdown ICU Team   [name] ZOX:[id number] DOB: [birth year] DOA: 06/16/2013 PCP: [name], MD  Brief narrative: 78 y.o. male who was brought to the ER after the patient was found to be very weak. As per the family patient had been experiencing diarrhea over the last one week but had not had any nausea or vomiting. Two days prior to admission the patient had periumbilical pain which has subsequently resolved. He had not been eating well for 2 days. Apparently other family members had similar GI illnesses or an. In the ER patient was found initially to be hypotensive and was given 2 L normal saline bolus followed by another 2 L. After the fluid boluses patient's blood pressure improved. Lactic acid levels have been normal. Patient's creatinine was markedly increased from baseline with an associated metabolic acidosis. Recently the patient had been having increasing hallucinations and was started on Risperdal by his primary care physician.  The patient developed V-tach arrest on 11/6 and was resuscitated (see notes) intubated and transferred to the ICU.  He was successfully extubated on 11/7 and transferred back to SDU.  ECHO revealed EF of 25-35%.  Assessment/Plan:    Acute renal failure/Metabolic acidosis   - resolved with hydration -baseline renal function 8 / 0.85 -continue hydration -etiology likely pre renal with ATN from hypotension/ACE I pre admit (on hold)  Norovirus induced enteritis -had watery diarrhea this morning after> 24 hr -C. Diff negative- GI pathogen panel positive for Norovirus -urinary Legionella negative -cont supportive care    CORONARY ARTERY DISEASE -per cardiology  Systolic CHF-  - management per cardiology  Vtach/ A-fib - Amio infusion was increased this AM after another bolus of Amio due to another episode of V-tach when pt was assisted to the bedside commode    Hypotension - cont IVF until PO  intake adequate and diarrhea resolved- watch for fluid overload    Transaminitis  -due to low perfusion/hypotension (shock liver) -follow labs-LFTs trending down    Unspecified Iron Deficiency Anemia    RENAL ARTERY STENOSIS    COPD    HTN (hypertension) -BP low- follow - on Coreg   DVT prophylaxis: SCDs Code Status: Full Family Communication: Patient Disposition Plan/Expected LOS: Stepdown Isolation: Contact isolation  Consultants: None  Procedures: None  Antibiotics: Cipro 11/4 >>>11/5 Flagyl 11/4 >>>11/5  HPI/Subjective: Pt alert- Rn and family not a cough with sputum for past 24 hrs. Pt has no complaints. Had another episode of V-tach this AM- cardiology was present and managed- see above.   Objective: Blood pressure 100/46, pulse 103, temperature 99.5 F (37.5 C), temperature source Oral, resp. rate 23, height 6' (1.829 m), weight 83.2 kg (183 lb 6.8 oz), SpO2 93.00%.  Intake/Output Summary (Last 24 hours) at 06/21/13 1004 Last data filed at 06/21/13 0900  Gross per 24 hour  Intake 1516.03 ml  Output    900 ml  Net 616.03 ml   Exam: General: Mild acute acute respiratory distress Lungs: Clear to auscultation bilaterally without wheezes or crackles, RA Cardiovascular: Irregular rapid rate and rhythm without murmur gallop or rub normal S1 and S2, no peripheral edema or JVD Abdomen: Nontender, nondistended, soft, bowel sounds positive, no rebound, no ascites, no appreciable mass Musculoskeletal: No significant cyanosis, clubbing of bilateral lower extremities Neurological: Awake and oriented , moves all extremities x 4 without focal neurological deficits, CN 2-12 intact  Scheduled Meds:  Scheduled Meds: . antiseptic oral rinse  15  mL Mouth Rinse BID  . aspirin  81 mg Oral Daily  . carvedilol  3.125 mg Oral BID WC  . cilostazol  100 mg Oral BID  . clopidogrel  75 mg Oral Q breakfast  . enoxaparin (LOVENOX) injection  40 mg Subcutaneous Q24H  . ferrous  sulfate  325 mg Oral TID WC  . magnesium sulfate 1 - 4 g bolus IVPB  1 g Intravenous Once  . potassium & sodium phosphates  2 packet Per NG tube TID WC & HS  . sodium chloride  500 mL Intravenous Once  . sodium chloride  3 mL Intravenous Q12H   Data Reviewed: Basic Metabolic Panel:  Recent Labs Lab 06/18/13 1910  06/19/13 1254 06/19/13 2035 06/19/13 2036 06/20/13 0045 06/20/13 0500 06/20/13 1815 06/21/13 0406  NA  --   < > 140  --  140 140 138 138 137  K  --   < > 3.5  --  2.9* 3.0* 3.4* 3.7 4.2  CL  --   < > 106  --  105 105 103 102 100  CO2  --   < > 21  --  25 27 28 29 26   GLUCOSE  --   < > 200*  --  132* 125* 123* 130* 136*  BUN  --   < > 29*  --  23 20 17 11 10   CREATININE  --   < > 1.03  --  0.89 0.84 0.82 0.83 0.89  CALCIUM  --   < > 7.7*  --  8.1* 7.8* 7.8* 7.9* 8.3*  MG 1.4*  --  2.3 1.8  --   --  1.8  --  1.7  PHOS 1.3*  --   --  1.4* 1.3*  --  2.2*  --  3.0  < > = values in this interval not displayed. Liver Function Tests:  Recent Labs Lab 06/17/13 0012 06/17/13 0230 06/18/13 0417 06/19/13 0357 06/19/13 2036  AST 16 12 24  58*  --   ALT 8 7 7 11   --   ALKPHOS 182* 156* 150* 143*  --   BILITOT 0.3 0.2* 0.3 0.4  --   PROT 6.8 6.2 5.9* 5.3*  --   ALBUMIN 2.9* 2.5* 2.3* 2.3* 2.1*    Recent Labs Lab 06/17/13 0554  AMMONIA 22   CBC:  Recent Labs Lab 06/17/13 0012 06/17/13 0554 06/18/13 0417 06/21/13 0406  WBC 15.8* 18.4* 13.0* 14.4*  NEUTROABS 12.8* 15.4*  --   --   HGB 12.1* 12.1* 11.2* 10.8*  HCT 34.3* 34.2* 31.0* 32.0*  MCV 87.1 87.9 86.4 90.7  PLT 233 236 194 180   Cardiac Enzymes:  Recent Labs Lab 06/18/13 0417 06/19/13 1256 06/19/13 2036 06/20/13 0045 06/20/13 1127 06/20/13 1815 06/20/13 2327  CKTOTAL 80 211  --   --   --   --   --   CKMB  --  16.7*  --   --   --   --   --   TROPONINI  --  4.53* 5.16* 5.46* 3.68* 3.53* 3.00*   BNP (last 3 results)  Recent Labs  12/23/12 2045  PROBNP 1188.0*   CBG:  Recent Labs Lab  06/17/13 0425 06/19/13 2035 06/19/13 2343 06/20/13 0337 06/20/13 0932  GLUCAP 105* 117* 103* 117* 88    Recent Results (from the past 240 hour(s))  CLOSTRIDIUM DIFFICILE BY PCR     Status: None   Collection Time    06/17/13  1:51 AM  Result Value Range Status   C difficile by pcr NEGATIVE  NEGATIVE Final  MRSA PCR SCREENING     Status: None   Collection Time    06/17/13  4:27 AM      Result Value Range Status   MRSA by PCR NEGATIVE  NEGATIVE Final   Comment:            The GeneXpert MRSA Assay (FDA     approved for NASAL specimens     only), is one component of a     comprehensive MRSA colonization     surveillance program. It is not     intended to diagnose MRSA     infection nor to guide or     monitor treatment for     MRSA infections.  CULTURE, BLOOD (ROUTINE X 2)     Status: None   Collection Time    06/17/13  7:40 AM      Result Value Range Status   Specimen Description BLOOD LEFT HAND   Final   Special Requests BOTTLES DRAWN AEROBIC AND ANAEROBIC 5CC   Final   Culture  Setup Time     Final   Value: 06/17/2013 13:55     Performed at [facility]   Culture     Final   Value:        BLOOD CULTURE RECEIVED NO GROWTH TO DATE CULTURE WILL BE HELD FOR 5 DAYS BEFORE ISSUING A FINAL NEGATIVE REPORT     Performed at [facility]   Report Status PENDING   Incomplete  CULTURE, BLOOD (ROUTINE X 2)     Status: None   Collection Time    06/17/13  7:55 AM      Result Value Range Status   Specimen Description BLOOD LEFT HAND   Final   Special Requests BOTTLES DRAWN AEROBIC AND ANAEROBIC 5CC   Final   Culture  Setup Time     Final   Value: 06/17/2013 13:55     Performed at [facility]   Culture     Final   Value:        BLOOD CULTURE RECEIVED NO GROWTH TO DATE CULTURE WILL BE HELD FOR 5 DAYS BEFORE ISSUING A FINAL NEGATIVE REPORT     Performed at [facility]   Report Status PENDING   Incomplete      Calvert Cantor, MD [phone]

## 2013-06-21 NOTE — Progress Notes (Signed)
Pt requesting to use the bathroom with sensation of needing to have bowel movement. With 2nd RN assistance, pt was transferred gingerly to bsc. When pt was seated on bsc, telemetry showed pt to be in v tach. Pt asymptomatic. Pt immediately returned to bed where pt was then asked to vagal down as well as hooked up to the zoll monitor. At that time 12 lead ekg and vitals signs obtained. With stable vitals but no break in v Electronics engineer at bedside administered amiodarone bolus. E link MD with Critical Care Medicine, as well as Cardiologist at bedside present and assessing pt condition and situation. Vitals cycling every 15 mins as well.  Pt spontaneously broke vtach rhythm and appears to be in afib with RVR (wide QRS complex) per 2nd 12 lead EKG. Cardiologist at bedside aware. Amiodarone drip increased by Consulting civil engineer. With pt settled in bed and stable at this time. AM labs drawn and results pending. Will continue to monitor pt closely. Family at bedside and has been informed by cardiologist of event as well as pt's current condition.

## 2013-06-22 ENCOUNTER — Inpatient Hospital Stay (HOSPITAL_COMMUNITY): Payer: Medicare Other

## 2013-06-22 DIAGNOSIS — J811 Chronic pulmonary edema: Secondary | ICD-10-CM

## 2013-06-22 DIAGNOSIS — J449 Chronic obstructive pulmonary disease, unspecified: Secondary | ICD-10-CM

## 2013-06-22 DIAGNOSIS — I10 Essential (primary) hypertension: Secondary | ICD-10-CM

## 2013-06-22 DIAGNOSIS — E7800 Pure hypercholesterolemia, unspecified: Secondary | ICD-10-CM

## 2013-06-22 DIAGNOSIS — F17200 Nicotine dependence, unspecified, uncomplicated: Secondary | ICD-10-CM

## 2013-06-22 LAB — BASIC METABOLIC PANEL
BUN: 8 mg/dL (ref 6–23)
CO2: 30 mEq/L (ref 19–32)
Chloride: 102 mEq/L (ref 96–112)
GFR calc non Af Amer: 85 mL/min — ABNORMAL LOW (ref >90)
Glucose, Bld: 98 mg/dL (ref 70–99)
Potassium: 3.3 mEq/L — ABNORMAL LOW (ref 3.5–5.1)
Sodium: 139 mEq/L (ref 135–145)

## 2013-06-22 LAB — CBC
HCT: 29.2 % — ABNORMAL LOW (ref 39.0–52.0)
Hemoglobin: 9.8 g/dL — ABNORMAL LOW (ref 13.0–17.0)
MCH: 30.7 pg (ref 26.0–34.0)
MCV: 91.5 fL (ref 78.0–100.0)
RBC: 3.19 MIL/uL — ABNORMAL LOW (ref 4.22–5.81)
WBC: 10.9 10*3/uL — ABNORMAL HIGH (ref 4.0–10.5)

## 2013-06-22 LAB — PHOSPHORUS
Phosphorus: 2.8 mg/dL (ref 2.3–4.6)
Phosphorus: 3.1 mg/dL (ref 2.3–4.6)

## 2013-06-22 LAB — MAGNESIUM: Magnesium: 1.5 mg/dL (ref 1.5–2.5)

## 2013-06-22 MED ORDER — MAGNESIUM SULFATE 40 MG/ML IJ SOLN
2.0000 g | Freq: Once | INTRAMUSCULAR | Status: AC
  Administered 2013-06-22: 2 g via INTRAVENOUS
  Filled 2013-06-22: qty 50

## 2013-06-22 MED ORDER — POTASSIUM CHLORIDE CRYS ER 20 MEQ PO TBCR
40.0000 meq | EXTENDED_RELEASE_TABLET | Freq: Two times a day (BID) | ORAL | Status: DC
  Administered 2013-06-23: 40 meq via ORAL
  Filled 2013-06-22 (×2): qty 2

## 2013-06-22 MED ORDER — POTASSIUM CHLORIDE CRYS ER 20 MEQ PO TBCR
40.0000 meq | EXTENDED_RELEASE_TABLET | Freq: Once | ORAL | Status: AC
  Administered 2013-06-22: 40 meq via ORAL
  Filled 2013-06-22: qty 2

## 2013-06-22 MED ORDER — FUROSEMIDE 10 MG/ML IJ SOLN
40.0000 mg | Freq: Once | INTRAMUSCULAR | Status: AC
  Administered 2013-06-22: 40 mg via INTRAVENOUS

## 2013-06-22 MED ORDER — MAGNESIUM OXIDE 400 (241.3 MG) MG PO TABS
400.0000 mg | ORAL_TABLET | Freq: Two times a day (BID) | ORAL | Status: DC
  Administered 2013-06-22 (×2): 400 mg via ORAL
  Filled 2013-06-22 (×4): qty 1

## 2013-06-22 MED ORDER — POTASSIUM & SODIUM PHOSPHATES 280-160-250 MG PO PACK
2.0000 | PACK | Freq: Three times a day (TID) | ORAL | Status: DC
  Administered 2013-06-22 – 2013-06-26 (×19): 2 via ORAL
  Filled 2013-06-22 (×26): qty 2

## 2013-06-22 NOTE — Progress Notes (Addendum)
[facility] Progress Note [place] TEAM 1 - Stepdown ICU Team   [name] ZOX:[id number] DOB: 1935/01/15 DOA: 06/16/2013 PCP: [name], MD  Brief narrative: 78 y.o. male who was brought to the ER after the patient was found to be very weak. As per the family patient had been experiencing diarrhea over the last one week but had not had any nausea or vomiting. Two days prior to admission the patient had periumbilical pain which has subsequently resolved. He had not been eating well for 2 days. Apparently other family members had similar GI illnesses or an. In the ER patient was found initially to be hypotensive and was given 2 L normal saline bolus followed by another 2 L. After the fluid boluses patient's blood pressure improved. Lactic acid levels have been normal. Patient's creatinine was markedly increased from baseline with an associated metabolic acidosis. Recently the patient had been having increasing hallucinations and was started on Risperdal by his primary care physician.  The patient developed V-tach arrest on 11/6 and was resuscitated (see notes) intubated and transferred to the ICU.  He was successfully extubated on 11/7 and transferred back to SDU.  ECHO revealed EF of 25-35%. He had another episode of V-tach on 11/8 in the AM at which point Amio infusion was increased.  His electrolytes have been difficult to manage with ongoing diarrhea and poor PO intake.   Assessment/Plan:    Acute renal failure/Metabolic acidosis   - resolved with hydration- will need to d/c IVF and give a dose of Lasix today due to development of pulm edema -baseline renal function 8 / 0.85 -etiology likely pre renal with ATN from hypotension/ACE I pre admit (on hold)  Hypokalemia/Hypophos/ Hypomag - cont to check daily and replace  Norovirus induced enteritis -had watery diarrhea this morning after> 24 hr -C. Diff negative- GI pathogen panel positive for Norovirus -urinary Legionella  negative -cont supportive care  Vtach/ A-fib - in setting of electrolyte abnormalities and an underlying cardiomyopathy- EF or 30% - Amio infusion was increased after another bolus of Amio due to another episode of V-tach (on 11/8 AM) when pt was assisted to the bedside commode    CORONARY ARTERY DISEASE -per cardiology  Systolic CHF- EF 81% - management per cardiology - giving a one time dose of Lasix today due to fluid overload and resultant hypoxia - reassess in AM - d/c slow IV hydration for now    Transaminitis  -due to low perfusion/hypotension (shock liver) -resolved    Unspecified Iron Deficiency Anemia    RENAL ARTERY STENOSIS    COPD    HTN (hypertension) -BP low- follow - on Coreg   DVT prophylaxis: SCDs Code Status: Full Family Communication: Patient Disposition Plan/Expected LOS: Stepdown Isolation: Contact isolation  Consultants: None  Procedures: None  Antibiotics: Cipro 11/4 >>>11/5 Flagyl 11/4 >>>11/5  HPI/Subjective: Pt alert- cough persists but is no worse than yesterday. Had a watery stool this AM - poor PO intake- family encouraged to bring food from home (sodium free)   Objective: Blood pressure 139/42, pulse 100, temperature 98.1 F (36.7 C), temperature source Oral, resp. rate 22, height 6' (1.829 m), weight 83.2 kg (183 lb 6.8 oz), SpO2 88.00%.  Intake/Output Summary (Last 24 hours) at 06/22/13 1355 Last data filed at 06/22/13 1354  Gross per 24 hour  Intake 1004.9 ml  Output   2250 ml  Net -1245.1 ml   Exam: General: Mild acute acute respiratory distress Lungs: mild crackles at bases  Cardiovascular: Irregular rapid rate and rhythm without murmur gallop or rub normal S1 and S2, no peripheral edema or JVD Abdomen: Nontender, nondistended, soft, bowel sounds positive, no rebound, no ascites, no appreciable mass Musculoskeletal: No significant cyanosis, clubbing of bilateral lower extremities Neurological: Awake and oriented ,  moves all extremities x 4 without focal neurological deficits, CN 2-12 intact  Scheduled Meds:  Scheduled Meds: . antiseptic oral rinse  15 mL Mouth Rinse BID  . aspirin  81 mg Oral Daily  . carvedilol  3.125 mg Oral BID WC  . cilostazol  100 mg Oral BID  . clopidogrel  75 mg Oral Q breakfast  . enoxaparin (LOVENOX) injection  40 mg Subcutaneous Q24H  . ferrous sulfate  325 mg Oral TID WC  . magnesium oxide  400 mg Oral BID  . potassium & sodium phosphates  2 packet Oral TID WC & HS  . [START ON 06/23/2013] potassium chloride  40 mEq Oral BID  . sodium chloride  500 mL Intravenous Once  . sodium chloride  3 mL Intravenous Q12H   Data Reviewed: Basic Metabolic Panel:  Recent Labs Lab 06/19/13 1254 06/19/13 2035 06/19/13 2036 06/20/13 0045 06/20/13 0500 06/20/13 1815 06/21/13 0406 06/22/13 0500  NA 140  --  140 140 138 138 137 139  K 3.5  --  2.9* 3.0* 3.4* 3.7 4.2 3.3*  CL 106  --  105 105 103 102 100 102  CO2 21  --  25 27 28 29 26 30   GLUCOSE 200*  --  132* 125* 123* 130* 136* 98  BUN 29*  --  23 20 17 11 10 8   CREATININE 1.03  --  0.89 0.84 0.82 0.83 0.89 0.79  CALCIUM 7.7*  --  8.1* 7.8* 7.8* 7.9* 8.3* 7.8*  MG 2.3 1.8  --   --  1.8  --  1.7 1.5  PHOS  --  1.4* 1.3*  --  2.2*  --  3.0 2.8   Liver Function Tests:  Recent Labs Lab 06/17/13 0012 06/17/13 0230 06/18/13 0417 06/19/13 0357 06/19/13 2036  AST 16 12 24  58*  --   ALT 8 7 7 11   --   ALKPHOS 182* 156* 150* 143*  --   BILITOT 0.3 0.2* 0.3 0.4  --   PROT 6.8 6.2 5.9* 5.3*  --   ALBUMIN 2.9* 2.5* 2.3* 2.3* 2.1*    Recent Labs Lab 06/17/13 0554  AMMONIA 22   CBC:  Recent Labs Lab 06/17/13 0012 06/17/13 0554 06/18/13 0417 06/21/13 0406 06/22/13 0500  WBC 15.8* 18.4* 13.0* 14.4* 10.9*  NEUTROABS 12.8* 15.4*  --   --   --   HGB 12.1* 12.1* 11.2* 10.8* 9.8*  HCT 34.3* 34.2* 31.0* 32.0* 29.2*  MCV 87.1 87.9 86.4 90.7 91.5  PLT 233 236 194 180 159   Cardiac Enzymes:  Recent Labs Lab  06/18/13 0417 06/19/13 1256 06/19/13 2036 06/20/13 0045 06/20/13 1127 06/20/13 1815 06/20/13 2327  CKTOTAL 80 211  --   --   --   --   --   CKMB  --  16.7*  --   --   --   --   --   TROPONINI  --  4.53* 5.16* 5.46* 3.68* 3.53* 3.00*   BNP (last 3 results)  Recent Labs  12/23/12 2045  PROBNP 1188.0*   CBG:  Recent Labs Lab 06/17/13 0425 06/19/13 2035 06/19/13 2343 06/20/13 0337 06/20/13 0932  GLUCAP 105* 117* 103* 117* 88  Recent Results (from the past 240 hour(s))  CLOSTRIDIUM DIFFICILE BY PCR     Status: None   Collection Time    06/17/13  1:51 AM      Result Value Range Status   C difficile by pcr NEGATIVE  NEGATIVE Final  MRSA PCR SCREENING     Status: None   Collection Time    06/17/13  4:27 AM      Result Value Range Status   MRSA by PCR NEGATIVE  NEGATIVE Final   Comment:            The GeneXpert MRSA Assay (FDA     approved for NASAL specimens     only), is one component of a     comprehensive MRSA colonization     surveillance program. It is not     intended to diagnose MRSA     infection nor to guide or     monitor treatment for     MRSA infections.  CULTURE, BLOOD (ROUTINE X 2)     Status: None   Collection Time    06/17/13  7:40 AM      Result Value Range Status   Specimen Description BLOOD LEFT HAND   Final   Special Requests BOTTLES DRAWN AEROBIC AND ANAEROBIC 5CC   Final   Culture  Setup Time     Final   Value: 06/17/2013 13:55     Performed at [facility]   Culture     Final   Value:        BLOOD CULTURE RECEIVED NO GROWTH TO DATE CULTURE WILL BE HELD FOR 5 DAYS BEFORE ISSUING A FINAL NEGATIVE REPORT     Performed at [facility]   Report Status PENDING   Incomplete  CULTURE, BLOOD (ROUTINE X 2)     Status: None   Collection Time    06/17/13  7:55 AM      Result Value Range Status   Specimen Description BLOOD LEFT HAND   Final   Special Requests BOTTLES DRAWN AEROBIC AND ANAEROBIC 5CC   Final   Culture  Setup  Time     Final   Value: 06/17/2013 13:55     Performed at [facility]   Culture     Final   Value:        BLOOD CULTURE RECEIVED NO GROWTH TO DATE CULTURE WILL BE HELD FOR 5 DAYS BEFORE ISSUING A FINAL NEGATIVE REPORT     Performed at [facility]   Report Status PENDING   Incomplete  CULTURE, EXPECTORATED SPUTUM-ASSESSMENT     Status: None   Collection Time    06/21/13  1:00 PM      Result Value Range Status   Specimen Description SPUTUM   Final   Special Requests Normal   Final   Sputum evaluation     Final   Value: THIS SPECIMEN IS ACCEPTABLE. RESPIRATORY CULTURE REPORT TO FOLLOW.   Report Status 06/21/2013 FINAL   Final  CULTURE, RESPIRATORY (NON-EXPECTORATED)     Status: None   Collection Time    06/21/13  1:00 PM      Result Value Range Status   Specimen Description SPUTUM   Final   Special Requests NONE   Final   Gram Stain PENDING   Incomplete   Culture     Final   Value: Culture reincubated for better growth     Performed at [facility]   Report Status  PENDING   Incomplete      Calvert Cantor, MD [phone]

## 2013-06-22 NOTE — Progress Notes (Addendum)
The patient was seen and examined, and I agree with the assessment and plan as documented above, with modifications as noted below. Given his problems with GI bleeding in the past, will likely have to avoid anticoagulation altogether. Hypokalemia and hypomagnesemia continue to be issues secondary to Norovirus enteritis, which are being replaced to assist with increasing dysrhythmic threshold (K>4, Mg>2). Continue amiodarone drip and additional medical management as documented in detail above. He desaturates to low to mid 80's with any exertion. Chest xray shows mild interstitial edema. Spoke with Dr. Butler Denmark and will give one dose of Lasix 40 mg IV x 1, with KCl replacement. Fluids have been cut back as well.

## 2013-06-22 NOTE — Progress Notes (Signed)
Subjective:  Episode of tachycardia around 12:45pm, thought to be VT vs Afib with his underlying BBB, amiodarone drip was continued. No anticoagulation was started 2/2 h/o GI bleed on Coumadin. He did vomit yesterday but none since. He is still having loose stools but only about 1 per day, last stool was this morning. He denies any CP, abdominal pain, or current N/V. Pt states that his breathing is improved today.   Objective:  Vital Signs in the last 24 hours: Temp:  [98.1 F (36.7 C)-98.9 F (37.2 C)] 98.6 F (37 C) (11/09 0357) Pulse Rate:  [94-106] 98 (11/08 1716) Resp:  [24-32] 24 (11/08 1600) BP: (117-144)/(45-51) 123/45 mmHg (11/08 1716) SpO2:  [89 %-91 %] 89 % (11/08 1600)  Intake/Output from previous day: 11/08 0701 - 11/09 0700 In: 1458.9 [P.O.:240; I.V.:1118.9; IV Piggyback:100] Out: 400 [Urine:400]  Physical Exam: Pt is alert and oriented, NAD HEENT: NCAT, nasal canula in place at 6L Neck: No JVD Lungs: Course breath sounds on the right.  CV: Regular rate, without murmur or gallop, defibrillator pads in place Abd: soft, NT, Positive BS, no hepatomegaly Ext: Trace pitting edema of BLE at the ankles, distal pulses intact and equal Skin: warm/dry no rash   Lab Results:  Recent Labs  06/21/13 0406 06/22/13 0500  WBC 14.4* 10.9*  HGB 10.8* 9.8*  PLT 180 159    Recent Labs  06/21/13 0406 06/22/13 0500  NA 137 139  K 4.2 3.3*  CL 100 102  CO2 26 30  GLUCOSE 136* 98  BUN 10 8  CREATININE 0.89 0.79    Recent Labs  06/20/13 1815 06/20/13 2327  TROPONINI 3.53* 3.00*    Cardiac Studies: 06/19/13 2D ECHO Study Conclusions  Left ventricle: LVEF is approximately 35 to 40% with akinesis of the base/mid inferior/inferoseptal and posterior walls; The cavity size was severely dilated. Wall thickness was normal.    Tele:  Telemetry reviewed. Normal sinus rhythm since around 3pm 11/8 with HR around 85.   Assessment/Plan:  78 year old man with  PMH of MI, mitral regurgitation, extensive PVD, HTN, hx of CVA, ongoing tobacco use, end stage CAD s/p 4V CABG with medical management (last cardiac cath on 5/14 with severe native and graft disease patent SVG to OM but unvisualized LIMA), who presented to the [facility] ED with Norovirus causing electrolyte abnormalities, resulting in multiple arrhythmias including VT requiring amiodarone and defibrillation on 11/6, and continues to have episodes of VT vs afib given his BBB.  1. VT: Continue aggressive replacement of electrolytes, along with amiodarone drip (currently at 60 mg/hr). Ischemia likely contributing as well, but he has a hx of severe CAD that is not amenable to revascularization. Hence, continue medical management which includes ASA, Plavix, Coreg (will increased once relative hypotension and ongoing diarrhea improve). Lipitor on hold due to transaminitis from shock liver, which can be restarted in future.   2. Severe CAD: Please see above in #1.   3. Atrial fibrillation: Currently on amiodarone and will likely require this for an extended period of time as an outpatient. With regards to anticoagulation, his wife says patient did not tolerate warfarin in the past due to bleeding issues. Currently on ASA, Plavix, and Pletal. This may need to be readdressed in the future, perhaps with regards to a NOAC. However, if atrial fibrillation resolves after current Norovirus enteritis and electrolyte imbalances also resolve, may be able to avoid anticoagulation. Currently on Lovenox.   4. Chronic systolic heart failure, EF  35-40%: Pt has been euvolemic, but with his ongoing fluid loss and replacement necessity, today he does appear to becoming slightly volume overloaded. Per pt, his breathing is improved, but he sounds course on the right and desaturates into the low-mid 80s with movement. Checking CXR, decreasing normal saline IVF to 50cc/hr dur to concerns for volume overload.    Genelle Gather,  MD Internal Medicine Resident, PGY II [facility] Internal Medicine Program 06/22/2013 8:17 AM

## 2013-06-23 LAB — CULTURE, BLOOD (ROUTINE X 2): Culture: NO GROWTH

## 2013-06-23 LAB — BASIC METABOLIC PANEL
BUN: 9 mg/dL (ref 6–23)
CO2: 33 mEq/L — ABNORMAL HIGH (ref 19–32)
Chloride: 98 mEq/L (ref 96–112)
Glucose, Bld: 94 mg/dL (ref 70–99)
Potassium: 3.9 mEq/L (ref 3.5–5.1)

## 2013-06-23 LAB — POCT I-STAT, CHEM 8
Creatinine, Ser: 1 mg/dL (ref 0.50–1.35)
Glucose, Bld: 138 mg/dL — ABNORMAL HIGH (ref 70–99)
HCT: 32 % — ABNORMAL LOW (ref 39.0–52.0)
Hemoglobin: 10.9 g/dL — ABNORMAL LOW (ref 13.0–17.0)
Sodium: 138 mEq/L (ref 135–145)
TCO2: 27 mmol/L (ref 0–100)

## 2013-06-23 LAB — PHOSPHORUS: Phosphorus: 3 mg/dL (ref 2.3–4.6)

## 2013-06-23 MED ORDER — POTASSIUM CHLORIDE CRYS ER 20 MEQ PO TBCR
40.0000 meq | EXTENDED_RELEASE_TABLET | Freq: Three times a day (TID) | ORAL | Status: DC
  Administered 2013-06-23 – 2013-06-26 (×10): 40 meq via ORAL
  Filled 2013-06-23 (×14): qty 2

## 2013-06-23 MED ORDER — POTASSIUM CHLORIDE CRYS ER 20 MEQ PO TBCR
40.0000 meq | EXTENDED_RELEASE_TABLET | Freq: Three times a day (TID) | ORAL | Status: DC
Start: 2013-06-23 — End: 2013-06-23

## 2013-06-23 MED ORDER — AMIODARONE HCL IN DEXTROSE 360-4.14 MG/200ML-% IV SOLN
30.0000 mg/h | INTRAVENOUS | Status: DC
  Administered 2013-06-23 – 2013-06-24 (×2): 30 mg/h via INTRAVENOUS
  Filled 2013-06-23 (×4): qty 200

## 2013-06-23 MED ORDER — FUROSEMIDE 10 MG/ML IJ SOLN
60.0000 mg | Freq: Two times a day (BID) | INTRAMUSCULAR | Status: DC
  Administered 2013-06-23 – 2013-06-24 (×2): 60 mg via INTRAVENOUS
  Filled 2013-06-23 (×2): qty 6

## 2013-06-23 MED ORDER — POTASSIUM CHLORIDE CRYS ER 20 MEQ PO TBCR
60.0000 meq | EXTENDED_RELEASE_TABLET | Freq: Three times a day (TID) | ORAL | Status: DC
  Administered 2013-06-23: 60 meq via ORAL

## 2013-06-23 MED ORDER — MAGNESIUM SULFATE 40 MG/ML IJ SOLN
2.0000 g | Freq: Once | INTRAMUSCULAR | Status: AC
  Administered 2013-06-23: 2 g via INTRAVENOUS
  Filled 2013-06-23: qty 50

## 2013-06-23 NOTE — Progress Notes (Signed)
[facility] Progress Note [place] TEAM 1 - Stepdown ICU Team   [name] ZOX:[id number] DOB: 1935-06-14 DOA: 06/16/2013 PCP: [name], MD  Brief narrative: 78 y.o. male who was brought to the ER after the patient was found to be very weak. As per the family patient had been experiencing diarrhea over the last one week but had not had any nausea or vomiting. Two days prior to admission the patient had periumbilical pain which has subsequently resolved. He had not been eating well for 2 days. Apparently other family members had similar GI illnesses or an. In the ER patient was found initially to be hypotensive and was given 2 L normal saline bolus followed by another 2 L. After the fluid boluses patient's blood pressure improved. Lactic acid levels have been normal. Patient's creatinine was markedly increased from baseline with an associated metabolic acidosis. Recently the patient had been having increasing hallucinations and was started on Risperdal by his primary care physician.  The patient developed V-tach arrest on 11/6 and was resuscitated (see notes) intubated and transferred to the ICU.  He was successfully extubated on 11/7 and transferred back to SDU.  ECHO revealed EF of 25-35%. He had another episode of V-tach on 11/8 in the AM at which point Amio infusion was increased.  His electrolytes have been difficult to manage with ongoing diarrhea and poor PO intake.   Assessment/Plan:  V tach vs aberrantly conducted A-fib - in setting of electrolyte abnormalities and an underlying cardiomyopathy - EF 30% - Amio infusion was increased after another bolus of Amio due to another episode of V-tach (on 11/8 AM) when pt was assisted to the bedside commode - mobilize cautiously with PT - Cardiology following   Acute Hypoxic Respiratory Failure/Systolic CHF- EF 81% -requiring 6 L oxygen -likely due to volume overload in setting recent volume replacement and depressed systolic  function - management per Cardiology - given Lasix 11/9 but still 15 L positive so will add Lasix 60 mg IV BID and increase Kdur to 60 meq   Norovirus induced enteritis -had watery diarrhea 11/9 morning after> 24 hr and suspect due to oral Mg started recently so will dc -GI pathogen panel positive for Norovirus -urinary Legionella negative -cont supportive care   Acute renal failure/Metabolic acidosis   - resolved with hydration but then developed sx's c/w pulmonary edema so IVFs were dc'd and she was given a dose of Lasix  -baseline renal function 8 / 0.85 -etiology likely pre renal with ATN from hypotension/ACE I pre admit (on hold)   Hypokalemia/Hypophos/ Hypomag - cont to check daily and replace   CORONARY ARTERY DISEASE -per Cardiology    Transaminitis  -due to low perfusion/hypotension (shock liver) -resolved    Unspecified Iron Deficiency Anemia    RENAL ARTERY STENOSIS    COPD    HTN  -BP low - follow - on Coreg  DVT prophylaxis: SCDs Code Status: Full Family Communication: Patient Disposition Plan/Expected LOS: Stepdown Isolation: Contact isolation  Consultants: None  Procedures: 2D Echocardiogram Left ventricle: LVEF is approximately 35 to 40% with akinesis of the base/mid inferior/inferoseptal and posterior walls; The cavity size was severely dilated. Wall thickness was normal.   Antibiotics: Cipro 11/4 >>>11/5 Flagyl 11/4 >>>11/5  HPI/Subjective: Pt alert-denies abdominal pain or SOB. Weak but desires to get OOB  Objective: Blood pressure 122/52, pulse 93, temperature 97.7 F (36.5 C), temperature source Oral, resp. rate 31, height 6' (1.829 m), weight 183 lb 6.8 oz (83.2  kg), SpO2 93.00%.  Intake/Output Summary (Last 24 hours) at 06/23/13 1256 Last data filed at 06/23/13 1200  Gross per 24 hour  Intake  866.6 ml  Output   1300 ml  Net -433.4 ml   Exam: General: Mild acute acute respiratory distress Lungs: mild crackles at  bases Cardiovascular: Regular rate and rhythm without murmur gallop or rub normal S1 and S2, no peripheral edema or JVD; IV Amiodarone  Abdomen: Nontender, nondistended, soft, bowel sounds positive, no rebound, no ascites, no appreciable mass Musculoskeletal: No significant cyanosis, clubbing of bilateral lower extremities Neurological: Awake and oriented , moves all extremities x 4 without focal neurological deficits, CN 2-12 intact  Scheduled Meds:  Scheduled Meds: . antiseptic oral rinse  15 mL Mouth Rinse BID  . aspirin  81 mg Oral Daily  . carvedilol  3.125 mg Oral BID WC  . cilostazol  100 mg Oral BID  . clopidogrel  75 mg Oral Q breakfast  . enoxaparin (LOVENOX) injection  40 mg Subcutaneous Q24H  . ferrous sulfate  325 mg Oral TID WC  . potassium & sodium phosphates  2 packet Oral TID WC & HS  . potassium chloride  40 mEq Oral TID  . sodium chloride  500 mL Intravenous Once  . sodium chloride  3 mL Intravenous Q12H   Data Reviewed: Basic Metabolic Panel:  Recent Labs Lab 06/19/13 2035  06/20/13 0500 06/20/13 1815 06/21/13 0406 06/22/13 0500 06/22/13 1754 06/23/13 0500  NA  --   < > 138 138 137 139  --  137  K  --   < > 3.4* 3.7 4.2 3.3*  --  3.9  CL  --   < > 103 102 100 102  --  98  CO2  --   < > 28 29 26 30   --  33*  GLUCOSE  --   < > 123* 130* 136* 98  --  94  BUN  --   < > 17 11 10 8   --  9  CREATININE  --   < > 0.82 0.83 0.89 0.79  --  0.86  CALCIUM  --   < > 7.8* 7.9* 8.3* 7.8*  --  8.1*  MG 1.8  --  1.8  --  1.7 1.5  --  1.5  PHOS 1.4*  < > 2.2*  --  3.0 2.8 3.1 3.0  < > = values in this interval not displayed.  Liver Function Tests:  Recent Labs Lab 06/17/13 0012 06/17/13 0230 06/18/13 0417 06/19/13 0357 06/19/13 2036  AST 16 12 24  58*  --   ALT 8 7 7 11   --   ALKPHOS 182* 156* 150* 143*  --   BILITOT 0.3 0.2* 0.3 0.4  --   PROT 6.8 6.2 5.9* 5.3*  --   ALBUMIN 2.9* 2.5* 2.3* 2.3* 2.1*    Recent Labs Lab 06/17/13 0554  AMMONIA 22    CBC:  Recent Labs Lab 06/17/13 0012 06/17/13 0554 06/18/13 0417 06/21/13 0406 06/22/13 0500  WBC 15.8* 18.4* 13.0* 14.4* 10.9*  NEUTROABS 12.8* 15.4*  --   --   --   HGB 12.1* 12.1* 11.2* 10.8* 9.8*  HCT 34.3* 34.2* 31.0* 32.0* 29.2*  MCV 87.1 87.9 86.4 90.7 91.5  PLT 233 236 194 180 159   Cardiac Enzymes:  Recent Labs Lab 06/18/13 0417 06/19/13 1256 06/19/13 2036 06/20/13 0045 06/20/13 1127 06/20/13 1815 06/20/13 2327  CKTOTAL 80 211  --   --   --   --   --  CKMB  --  16.7*  --   --   --   --   --   TROPONINI  --  4.53* 5.16* 5.46* 3.68* 3.53* 3.00*   BNP (last 3 results)  Recent Labs  12/23/12 2045  PROBNP 1188.0*   CBG:  Recent Labs Lab 06/17/13 0425 06/19/13 2035 06/19/13 2343 06/20/13 0337 06/20/13 0932  GLUCAP 105* 117* 103* 117* 88    Recent Results (from the past 240 hour(s))  CLOSTRIDIUM DIFFICILE BY PCR     Status: None   Collection Time    06/17/13  1:51 AM      Result Value Range Status   C difficile by pcr NEGATIVE  NEGATIVE Final  MRSA PCR SCREENING     Status: None   Collection Time    06/17/13  4:27 AM      Result Value Range Status   MRSA by PCR NEGATIVE  NEGATIVE Final   Comment:            The GeneXpert MRSA Assay (FDA     approved for NASAL specimens     only), is one component of a     comprehensive MRSA colonization     surveillance program. It is not     intended to diagnose MRSA     infection nor to guide or     monitor treatment for     MRSA infections.  CULTURE, BLOOD (ROUTINE X 2)     Status: None   Collection Time    06/17/13  7:40 AM      Result Value Range Status   Specimen Description BLOOD LEFT HAND   Final   Special Requests BOTTLES DRAWN AEROBIC AND ANAEROBIC 5CC   Final   Culture  Setup Time     Final   Value: 06/17/2013 13:55     Performed at [facility]   Culture     Final   Value: NO GROWTH 5 DAYS     Performed at [facility]   Report Status 06/23/2013 FINAL   Final   CULTURE, BLOOD (ROUTINE X 2)     Status: None   Collection Time    06/17/13  7:55 AM      Result Value Range Status   Specimen Description BLOOD LEFT HAND   Final   Special Requests BOTTLES DRAWN AEROBIC AND ANAEROBIC 5CC   Final   Culture  Setup Time     Final   Value: 06/17/2013 13:55     Performed at [facility]   Culture     Final   Value: NO GROWTH 5 DAYS     Performed at [facility]   Report Status 06/23/2013 FINAL   Final  CULTURE, EXPECTORATED SPUTUM-ASSESSMENT     Status: None   Collection Time    06/21/13  1:00 PM      Result Value Range Status   Specimen Description SPUTUM   Final   Special Requests Normal   Final   Sputum evaluation     Final   Value: THIS SPECIMEN IS ACCEPTABLE. RESPIRATORY CULTURE REPORT TO FOLLOW.   Report Status 06/21/2013 FINAL   Final  CULTURE, RESPIRATORY (NON-EXPECTORATED)     Status: None   Collection Time    06/21/13  1:00 PM      Result Value Range Status   Specimen Description SPUTUM   Final   Special Requests NONE   Final   Gram Stain  Final   Value: NO WBC SEEN     NO SQUAMOUS EPITHELIAL CELLS SEEN     FEW GRAM POSITIVE COCCI     IN PAIRS     Performed at [facility]   Culture     Final   Value: NORMAL OROPHARYNGEAL FLORA     Performed at [facility]   Report Status PENDING   Incomplete      Junious Silk, ANP [phone]  I have personally examined this patient and reviewed the entire database. I have reviewed the above note, made any necessary editorial changes, and agree with its content.  Lonia Blood, MD [facility]  Office  [phone] Pager [phone]  On-Call/Text Page:      [email or web address]      password [facility]

## 2013-06-23 NOTE — Progress Notes (Signed)
SUBJECTIVE: Pt with no complaints. Still on O2. Dyspnea with minimal exertion. No chest pain.   BP 121/47  Pulse 84  Temp(Src) 98.5 F (36.9 C) (Oral)  Resp 33  Ht 6' (1.829 m)  Wt 183 lb 6.8 oz (83.2 kg)  BMI 24.87 kg/m2  SpO2 90%  Intake/Output Summary (Last 24 hours) at 06/23/13 1610 Last data filed at 06/23/13 0800  Gross per 24 hour  Intake  739.9 ml  Output   2850 ml  Net -2110.1 ml    PHYSICAL EXAM General: Well developed, well nourished, in no acute distress. Alert and oriented x 3.  Psych:  Good affect, responds appropriately Neck: No JVD. No masses noted.  Lungs: Coarse BS bilaterally with wheezes noted.  Heart: RRR with no murmurs noted. Abdomen: Bowel sounds are present. Soft, non-tender.  Extremities: No lower extremity edema.   LABS: Basic Metabolic Panel:  Recent Labs  96/04/54 0500 06/22/13 1754 06/23/13 0500  NA 139  --  137  K 3.3*  --  3.9  CL 102  --  98  CO2 30  --  33*  GLUCOSE 98  --  94  BUN 8  --  9  CREATININE 0.79  --  0.86  CALCIUM 7.8*  --  8.1*  MG 1.5  --  1.5  PHOS 2.8 3.1 3.0   CBC:  Recent Labs  06/21/13 0406 06/22/13 0500  WBC 14.4* 10.9*  HGB 10.8* 9.8*  HCT 32.0* 29.2*  MCV 90.7 91.5  PLT 180 159   Cardiac Enzymes:  Recent Labs  06/20/13 1127 06/20/13 1815 06/20/13 2327  TROPONINI 3.68* 3.53* 3.00*   Current Meds: . antiseptic oral rinse  15 mL Mouth Rinse BID  . aspirin  81 mg Oral Daily  . carvedilol  3.125 mg Oral BID WC  . cilostazol  100 mg Oral BID  . clopidogrel  75 mg Oral Q breakfast  . enoxaparin (LOVENOX) injection  40 mg Subcutaneous Q24H  . ferrous sulfate  325 mg Oral TID WC  . magnesium oxide  400 mg Oral BID  . potassium & sodium phosphates  2 packet Oral TID WC & HS  . potassium chloride  40 mEq Oral BID  . sodium chloride  500 mL Intravenous Once  . sodium chloride  3 mL Intravenous Q12H    ASSESSMENT AND PLAN:  1. VT: No recurrence last 24 hours. Question if this could  be atrial fib with bundle branch making it appear wide. Continue amiodarone drip but reduce to 30 mg/hr. Ischemia likely contributing as well, but he has end stage CAD with no good options for PCI of his diseased vein grafts or native vessels.  2. CAD: Known to have severe CAD s/p CABG with no good options for PCI at last cath in May 2014. He is not having chest pain. At this point, will continue medical therapy with ASA, Plavix, Coreg. Lipitor on hold due to transaminitis from shock liver, which can be restarted in future.    3. Atrial fibrillation: Currently on amiodarone and will likely require this for an extended period of time as an outpatient. Did not tolerate warfarin in the past due to bleeding issues. Currently on ASA, Plavix, and Pletal. This may need to be readdressed in the future, perhaps with regards to a NOAC. However, if atrial fibrillation resolves after current Norovirus enteritis and electrolyte imbalances also resolve, may be able to avoid anticoagulation. Currently on Lovenox.   4. Chronic  systolic heart failure, EF 35-40%: IV Lasix yesterday for mild volume overload. He appears to be euvolemic today. I suspect his underlying lung disease is contributing to his desaturations.    MCALHANY,CHRISTOPHER  11/10/20149:17 AM

## 2013-06-24 ENCOUNTER — Ambulatory Visit: Payer: Medicare Other | Admitting: Vascular Surgery

## 2013-06-24 DIAGNOSIS — I5043 Acute on chronic combined systolic (congestive) and diastolic (congestive) heart failure: Secondary | ICD-10-CM

## 2013-06-24 LAB — COMPREHENSIVE METABOLIC PANEL
AST: 24 U/L (ref 0–37)
Albumin: 1.6 g/dL — ABNORMAL LOW (ref 3.5–5.2)
BUN: 10 mg/dL (ref 6–23)
CO2: 31 mEq/L (ref 19–32)
Chloride: 100 mEq/L (ref 96–112)
Creatinine, Ser: 0.97 mg/dL (ref 0.50–1.35)
GFR calc non Af Amer: 78 mL/min — ABNORMAL LOW (ref >90)
Potassium: 4.3 mEq/L (ref 3.5–5.1)
Sodium: 136 mEq/L (ref 135–145)
Total Bilirubin: 0.3 mg/dL (ref 0.3–1.2)
Total Protein: 4.6 g/dL — ABNORMAL LOW (ref 6.0–8.3)

## 2013-06-24 LAB — PHOSPHORUS: Phosphorus: 2.6 mg/dL (ref 2.3–4.6)

## 2013-06-24 LAB — CULTURE, RESPIRATORY

## 2013-06-24 LAB — CULTURE, RESPIRATORY W GRAM STAIN

## 2013-06-24 LAB — MAGNESIUM: Magnesium: 1.6 mg/dL (ref 1.5–2.5)

## 2013-06-24 MED ORDER — FUROSEMIDE 10 MG/ML IJ SOLN
INTRAMUSCULAR | Status: AC
  Filled 2013-06-24: qty 4

## 2013-06-24 MED ORDER — FUROSEMIDE 10 MG/ML IJ SOLN
60.0000 mg | Freq: Three times a day (TID) | INTRAMUSCULAR | Status: DC
  Administered 2013-06-24 – 2013-06-25 (×5): 60 mg via INTRAVENOUS
  Filled 2013-06-24 (×6): qty 6

## 2013-06-24 MED ORDER — SERTRALINE HCL 100 MG PO TABS
100.0000 mg | ORAL_TABLET | Freq: Every day | ORAL | Status: DC
  Administered 2013-06-24 – 2013-06-28 (×5): 100 mg via ORAL
  Filled 2013-06-24 (×5): qty 1

## 2013-06-24 MED ORDER — AMIODARONE HCL 200 MG PO TABS
400.0000 mg | ORAL_TABLET | Freq: Two times a day (BID) | ORAL | Status: DC
  Administered 2013-06-24 – 2013-06-28 (×9): 400 mg via ORAL
  Filled 2013-06-24 (×10): qty 2

## 2013-06-24 MED ORDER — MAGNESIUM SULFATE 40 MG/ML IJ SOLN
2.0000 g | Freq: Every day | INTRAMUSCULAR | Status: DC
  Administered 2013-06-24 – 2013-06-28 (×4): 2 g via INTRAVENOUS
  Filled 2013-06-24 (×6): qty 50

## 2013-06-24 NOTE — Progress Notes (Signed)
     SUBJECTIVE: No chest pain or SOB.   BP 118/48  Pulse 78  Temp(Src) 98.6 F (37 C) (Oral)  Resp 25  Ht 6' (1.829 m)  Wt 179 lb 14.3 oz (81.6 kg)  BMI 24.39 kg/m2  SpO2 92%  Intake/Output Summary (Last 24 hours) at 06/24/13 0747 Last data filed at 06/24/13 0600  Gross per 24 hour  Intake 1843.9 ml  Output   1075 ml  Net  768.9 ml    PHYSICAL EXAM General: Well developed, well nourished, in no acute distress. Alert and oriented x 3.  Psych:  Good affect, responds appropriately Neck: No JVD. No masses noted.  Lungs: Clear bilaterally with no wheezes or rhonci noted.  Heart: RRR with no murmurs noted. Abdomen: Bowel sounds are present. Soft, non-tender.  Extremities: No lower extremity edema.   LABS: Basic Metabolic Panel:  Recent Labs  16/10/96 0500 06/24/13 0500  NA 137 136  K 3.9 4.3  CL 98 100  CO2 33* 31  GLUCOSE 94 87  BUN 9 10  CREATININE 0.86 0.97  CALCIUM 8.1* 7.8*  MG 1.5 1.6  PHOS 3.0 2.6   CBC:  Recent Labs  06/22/13 0500  WBC 10.9*  HGB 9.8*  HCT 29.2*  MCV 91.5  PLT 159   Current Meds: . antiseptic oral rinse  15 mL Mouth Rinse BID  . aspirin  81 mg Oral Daily  . carvedilol  3.125 mg Oral BID WC  . cilostazol  100 mg Oral BID  . clopidogrel  75 mg Oral Q breakfast  . enoxaparin (LOVENOX) injection  40 mg Subcutaneous Q24H  . ferrous sulfate  325 mg Oral TID WC  . furosemide  60 mg Intravenous BID  . potassium & sodium phosphates  2 packet Oral TID WC & HS  . potassium chloride  40 mEq Oral TID  . sodium chloride  3 mL Intravenous Q12H   ASSESSMENT AND PLAN:   1. Wide complex tachycardia/VT: No recurrence last 48 hours. Question if this could be atrial fib with bundle branch making it appear wide. Will change amiodarone to po today. Monitor on telemetry. I have discussed anti-coagulation with pt and wife and they do not wish to pursue anticoagulation after discharge. He refuses to consider coumadin or a newer agent.   2. CAD:  Known to have severe CAD s/p CABG with no good options for PCI at last cath in May 2014. He is not having chest pain. At this point, will continue medical therapy with ASA, Plavix, Coreg. Lipitor on hold due to transaminitis from shock liver, which can be restarted in future.   3. Chronic systolic heart failure, EF 35-40%: Difficult to assess volume but CXR with evidence of volume overload on 06/22/13. He has chronic dyspnea due to COPD but agree that his weight is up (9lbs from baseline) and he is positive 16 liters since admission. Agree with diuresis with IV Lasix with ongoing hypoxia. I suspect his underlying lung disease is contributing to his desaturations as well.   Should be ok to transfer to telemetry unit.    MCALHANY,CHRISTOPHER  11/11/20147:47 AM

## 2013-06-24 NOTE — Progress Notes (Addendum)
Family and small children came to visit, refused to wear isolation gown, claimed that they were the one who gave the norovirus to the pt. Instructed to wash hands when leaving  the room.

## 2013-06-24 NOTE — Evaluation (Signed)
Physical Therapy Evaluation Patient Details Name: [name] MRN: [id number] DOB: 13-Nov-1934 Today's Date: 06/24/2013 Time: 4098-1191 PT Time Calculation (min): 29 min  PT Assessment / Plan / Recommendation History of Present Illness  [name] is a 78 y.o. male was brought to the ER the patient was found to be very weak. As per the family patient has been experiencing diarrhea over the last one week but has not had any nausea vomiting. 2 days ago patient had periumbilical pain which at this time patient states has resolved. The patient developed V-tach arrest on 11/6 and was resuscitated (see notes) intubated and transferred to the ICU; extubated 11/7.  Had another episode V-tach 11/8.    Clinical Impression  Pt admitted with the above. Pt currently with functional limitations due to the deficits listed below (see PT Problem List). Limited evaluation due to destats to 84% on 6L in sitting EOB.  Pt was returned to supine and RN notified.  Upon return to supine, sats decreased to 78%, then ranged from 82-86% on 6L. RN placed pt on venti mask with 50% FIO2 with sats up to 87%.  Pt will benefit from skilled PT to increase their independence and safety with mobility to allow discharge to the venue listed below. Will continue to follow.      PT Assessment  Patient needs continued PT services    Follow Up Recommendations  Home health PT;Supervision/Assistance - 24 hour    Equipment Recommendations  None recommended by PT    Frequency Min 3X/week    Precautions / Restrictions Precautions Precautions: Fall;Other (comment) Precaution Comments: monitor 02 sats Restrictions Weight Bearing Restrictions: No   Pertinent Vitals/Pain C/o right flank pain; RN aware      Mobility  Bed Mobility Bed Mobility: Supine to Sit;Sitting - Scoot to [employer] of Bed;Sit to Supine;Scooting to [facility] Supine to Sit: 4: Min guard Sitting - Scoot to [employer] of Bed: 4: Min guard Sit to Supine: 4: Min guard Scooting  to HOB: 1: +2 Total assist Scooting to [facility]: Patient Percentage: 40% Details for Bed Mobility Assistance: min guard for safety  Transfers Transfers: Not assessed Ambulation/Gait Ambulation/Gait Assistance: Not tested (comment)    Exercises     PT Diagnosis: Difficulty walking;Generalized weakness  PT Problem List: Decreased strength;Decreased activity tolerance;Decreased balance;Decreased mobility;Decreased knowledge of use of DME;Cardiopulmonary status limiting activity PT Treatment Interventions: DME instruction;Gait training;Functional mobility training;Therapeutic activities;Therapeutic exercise;Balance training;Patient/family education;Stair training     PT Goals(Current goals can be found in the care plan section) Acute Rehab PT Goals Patient Stated Goal: I want to walk PT Goal Formulation: With patient Time For Goal Achievement: 07/01/13 Potential to Achieve Goals: Good  Visit Information  Last PT Received On: 06/24/13 Assistance Needed: +2 (for safety) History of Present Illness: [name] is a 78 y.o. male was brought to the ER the patient was found to be very weak. As per the family patient has been experiencing diarrhea over the last one week but has not had any nausea vomiting. 2 days ago patient had periumbilical pain which at this time patient states has resolved. The patient developed V-tach arrest on 11/6 and was resuscitated (see notes) intubated and transferred to the ICU; extubated 11/7.  Had another episode V-tach 11/8.         Prior Functioning  Home Living Family/patient expects to be discharged to:: Private residence Available Help at Discharge: Family;Available 24 hours/day Type of Home: House Home Access: Stairs to enter [employer] of  Steps: 1 Entrance Stairs-Rails: None Home Layout: One level Home Equipment: Walker - 2 wheels;Wheelchair - power;Tub bench Prior Function Level of Independence: Independent with assistive device(s) Comments:  Dtr reports that pt with limited activity.  He performed self care activities, but only walked from bedroom to kitchen to bathroom Communication Communication: No difficulties Dominant Hand: Right    Cognition  Cognition Arousal/Alertness: Awake/alert Behavior During Therapy: WFL for tasks assessed/performed Overall Cognitive Status: Within Functional Limits for tasks assessed    Extremity/Trunk Assessment Upper Extremity Assessment Upper Extremity Assessment: Generalized weakness Lower Extremity Assessment Lower Extremity Assessment: Defer to PT evaluation   Balance Balance Balance Assessed: Yes Static Sitting Balance Static Sitting - Balance Support: Feet supported Static Sitting - Level of Assistance: 5: Stand by assistance Static Sitting - Comment/# of Minutes: ~ 5 minutes  End of Session PT - End of Session Equipment Utilized During Treatment: Oxygen (Initially 6L pt placed on venti mask 15L) Activity Tolerance: Treatment limited secondary to medical complications (Comment) Patient left: in bed;with call bell/phone within reach Nurse Communication: Mobility status  GP     Nikala Walsworth 06/24/2013, 1:55 PM  Jake Shark, PT DPT [phone]

## 2013-06-24 NOTE — Accreditation Note (Addendum)
PT came and tried to walked him  along the room but desats to 80"S. Placed on vm but still desats, MD made aware.  Continue to monitor. Pt is not on any form of resp. Distress.

## 2013-06-24 NOTE — Clinical Social Work Note (Signed)
CSW received consult for SNF placement, but OT & PT notes both recommend home health. CSW will complete assessment and collaborate with [facility] tomorrow.   Maryclare Labrador, MSW, [facility] Clinical Social Worker [phone]

## 2013-06-24 NOTE — Progress Notes (Signed)
[facility] Progress Note [place] TEAM 1 - Stepdown ICU Team   [name] BJY:[id number] DOB: 07/10/1935 DOA: 06/16/2013 PCP: [name], MD  Brief narrative: 78 y.o. male who was brought to the ER after the patient was found to be very weak. As per the family patient had been experiencing diarrhea over the last one week but had not had any nausea or vomiting. Two days prior to admission the patient had periumbilical pain which has subsequently resolved. He had not been eating well for 2 days. Apparently other family members had similar GI illnesses or an. In the ER patient was found initially to be hypotensive and was given 2 L normal saline bolus followed by another 2 L. After the fluid boluses patient's blood pressure improved. Lactic acid levels have been normal. Patient's creatinine was markedly increased from baseline with an associated metabolic acidosis. Recently the patient had been having increasing hallucinations and was started on Risperdal by his primary care physician.  The patient developed V-tach arrest on 11/6 and was resuscitated (see notes) intubated and transferred to the ICU.  He was successfully extubated on 11/7 and transferred back to SDU.  ECHO revealed EF of 25-35%. He had another episode of V-tach on 11/8 in the AM at which point Amio infusion was increased.  His electrolytes have been difficult to manage with ongoing diarrhea and poor PO intake.   Assessment/Plan:  V tach vs aberrantly conducted A-fib - in setting of electrolyte abnormalities and an underlying cardiomyopathy - EF 30% - Amio infusion was increased after another bolus of Amio due to another episode of V-tach (on 11/8 AM) when pt was assisted to the bedside commode-transitioned to PO Amiodarone 11/11 - mobilize cautiously with PT - Cardiology following  -pt/and family refuse anti coagulation with either coumadin or NACA  Acute Hypoxic Respiratory Failure/Systolic CHF- EF 08%/MVHQ -requiring  6 L oxygen-destas with obilization -likely due to volume overload in setting recent volume replacement and depressed systolic function - management per Cardiology - given Lasix 11/9 but still 15 L positive so addedLasix 60 mg IV BID on 11/10 and increased Kdur to 60 meq- on 11/11 still overloaded and becoming quite hypoxic on movement therefore, Lasix increased to TID   Norovirus induced enteritis -had watery diarrhea 11/9 and suspect due to oral Mg which was started recently so was dc'd -GI pathogen panel positive for Norovirus -urinary Legionella negative -cont supportive care   Acute renal failure/Metabolic acidosis   - resolved with hydration but then developed sx's c/w pulmonary edema so IVFs were dc'd and she was given a dose of Lasix  -baseline renal function 8 / 0.85 -etiology likely pre renal with ATN from hypotension/ACE I pre admit (on hold)   Hypokalemia/Hypophos/ Hypomag - cont to check daily and replace prn   CORONARY ARTERY DISEASE -per Cardiology    Transaminitis  -due to low perfusion/hypotension (shock liver) -resolved so can resume Lipitor at dc    Unspecified Iron Deficiency Anemia    RENAL ARTERY STENOSIS    COPD    HTN  -BP low - follow - on Coreg  DVT prophylaxis: SCDs Code Status: Full Family Communication: Patient Disposition Plan/Expected LOS: Stepdown Isolation: Contact isolation  Consultants: None  Procedures: 2D Echocardiogram Left ventricle: LVEF is approximately 35 to 40% with akinesis of the base/mid inferior/inferoseptal and posterior walls; The cavity size was severely dilated. Wall thickness was normal.   Antibiotics: Cipro 11/4 >>>11/5 Flagyl 11/4 >>>11/5  HPI/Subjective: Pt alert-appetite has returned-  still quite weak/deconditioned  Objective: Blood pressure 115/47, pulse 100, temperature 98.6 F (37 C), temperature source Oral, resp. rate 27, height 6' (1.829 m), weight 179 lb 14.3 oz (81.6 kg), SpO2  95.00%.  Intake/Output Summary (Last 24 hours) at 06/24/13 1503 Last data filed at 06/24/13 1420  Gross per 24 hour  Intake 1400.6 ml  Output   2525 ml  Net -1124.4 ml   Exam: General: Mild acute acute respiratory distress Lungs: mild crackles at bases Cardiovascular: Regular rate and rhythm without murmur gallop or rub normal S1 and S2, no peripheral edema or JVD Abdomen: Nontender, nondistended, soft, bowel sounds positive, no rebound, no ascites, no appreciable mass Musculoskeletal: No significant cyanosis, clubbing of bilateral lower extremities Neurological: Awake and oriented , moves all extremities x 4 without focal neurological deficits, CN 2-12 intact  Scheduled Meds:  Scheduled Meds: . amiodarone  400 mg Oral BID  . antiseptic oral rinse  15 mL Mouth Rinse BID  . aspirin  81 mg Oral Daily  . carvedilol  3.125 mg Oral BID WC  . cilostazol  100 mg Oral BID  . clopidogrel  75 mg Oral Q breakfast  . enoxaparin (LOVENOX) injection  40 mg Subcutaneous Q24H  . ferrous sulfate  325 mg Oral TID WC  . furosemide  60 mg Intravenous TID  . magnesium sulfate 1 - 4 g bolus IVPB  2 g Intravenous Daily  . potassium & sodium phosphates  2 packet Oral TID WC & HS  . potassium chloride  40 mEq Oral TID  . sodium chloride  3 mL Intravenous Q12H   Data Reviewed: Basic Metabolic Panel:  Recent Labs Lab 06/20/13 0500 06/20/13 1815 06/21/13 0401 06/21/13 0406 06/22/13 0500 06/22/13 1754 06/23/13 0500 06/24/13 0500  NA 138 138 138 137 139  --  137 136  K 3.4* 3.7 4.1 4.2 3.3*  --  3.9 4.3  CL 103 102 101 100 102  --  98 100  CO2 28 29  --  26 30  --  33* 31  GLUCOSE 123* 130* 138* 136* 98  --  94 87  BUN 17 11 9 10 8   --  9 10  CREATININE 0.82 0.83 1.00 0.89 0.79  --  0.86 0.97  CALCIUM 7.8* 7.9*  --  8.3* 7.8*  --  8.1* 7.8*  MG 1.8  --   --  1.7 1.5  --  1.5 1.6  PHOS 2.2*  --   --  3.0 2.8 3.1 3.0 2.6    Liver Function Tests:  Recent Labs Lab 06/18/13 0417  06/19/13 0357 06/19/13 2036 06/24/13 0500  AST 24 58*  --  24  ALT 7 11  --  16  ALKPHOS 150* 143*  --  132*  BILITOT 0.3 0.4  --  0.3  PROT 5.9* 5.3*  --  4.6*  ALBUMIN 2.3* 2.3* 2.1* 1.6*   No results found for this basename: AMMONIA,  in the last 168 hours CBC:  Recent Labs Lab 06/18/13 0417 06/21/13 0401 06/21/13 0406 06/22/13 0500  WBC 13.0*  --  14.4* 10.9*  HGB 11.2* 10.9* 10.8* 9.8*  HCT 31.0* 32.0* 32.0* 29.2*  MCV 86.4  --  90.7 91.5  PLT 194  --  180 159   Cardiac Enzymes:  Recent Labs Lab 06/18/13 0417 06/19/13 1256 06/19/13 2036 06/20/13 0045 06/20/13 1127 06/20/13 1815 06/20/13 2327  CKTOTAL 80 211  --   --   --   --   --  CKMB  --  16.7*  --   --   --   --   --   TROPONINI  --  4.53* 5.16* 5.46* 3.68* 3.53* 3.00*   BNP (last 3 results)  Recent Labs  12/23/12 2045  PROBNP 1188.0*   CBG:  Recent Labs Lab 06/19/13 2035 06/19/13 2343 06/20/13 0337 06/20/13 0932  GLUCAP 117* 103* 117* 88    Recent Results (from the past 240 hour(s))  CLOSTRIDIUM DIFFICILE BY PCR     Status: None   Collection Time    06/17/13  1:51 AM      Result Value Range Status   C difficile by pcr NEGATIVE  NEGATIVE Final  MRSA PCR SCREENING     Status: None   Collection Time    06/17/13  4:27 AM      Result Value Range Status   MRSA by PCR NEGATIVE  NEGATIVE Final   Comment:            The GeneXpert MRSA Assay (FDA     approved for NASAL specimens     only), is one component of a     comprehensive MRSA colonization     surveillance program. It is not     intended to diagnose MRSA     infection nor to guide or     monitor treatment for     MRSA infections.  CULTURE, BLOOD (ROUTINE X 2)     Status: None   Collection Time    06/17/13  7:40 AM      Result Value Range Status   Specimen Description BLOOD LEFT HAND   Final   Special Requests BOTTLES DRAWN AEROBIC AND ANAEROBIC 5CC   Final   Culture  Setup Time     Final   Value: 06/17/2013 13:55      Performed at [facility]   Culture     Final   Value: NO GROWTH 5 DAYS     Performed at [facility]   Report Status 06/23/2013 FINAL   Final  CULTURE, BLOOD (ROUTINE X 2)     Status: None   Collection Time    06/17/13  7:55 AM      Result Value Range Status   Specimen Description BLOOD LEFT HAND   Final   Special Requests BOTTLES DRAWN AEROBIC AND ANAEROBIC 5CC   Final   Culture  Setup Time     Final   Value: 06/17/2013 13:55     Performed at [facility]   Culture     Final   Value: NO GROWTH 5 DAYS     Performed at [facility]   Report Status 06/23/2013 FINAL   Final  CULTURE, EXPECTORATED SPUTUM-ASSESSMENT     Status: None   Collection Time    06/21/13  1:00 PM      Result Value Range Status   Specimen Description SPUTUM   Final   Special Requests Normal   Final   Sputum evaluation     Final   Value: THIS SPECIMEN IS ACCEPTABLE. RESPIRATORY CULTURE REPORT TO FOLLOW.   Report Status 06/21/2013 FINAL   Final  CULTURE, RESPIRATORY (NON-EXPECTORATED)     Status: None   Collection Time    06/21/13  1:00 PM      Result Value Range Status   Specimen Description SPUTUM   Final   Special Requests NONE   Final   Gram Stain     Final  Value: NO WBC SEEN     NO SQUAMOUS EPITHELIAL CELLS SEEN     FEW GRAM POSITIVE COCCI     IN PAIRS     Performed at [facility]   Culture     Final   Value: NORMAL OROPHARYNGEAL FLORA     Performed at [facility]   Report Status 06/24/2013 FINAL   Final      Junious Silk, ANP [phone]  On-Call/Text Page:      [email or web address]      password [email or web address]    I have examined the patient, reviewed the chart and modified the above note which I agree with.   Logyn Dedominicis,MD [phone] 06/24/2013, 5:25 PM

## 2013-06-24 NOTE — Evaluation (Signed)
Occupational Therapy Evaluation Patient Details Name: WILTON THRALL MRN: [id number] DOB: 24-Feb-1935 Today's Date: 06/24/2013 Time: 1130-1200 OT Time Calculation (min): 30 min  OT Assessment / Plan / Recommendation History of present illness DERIAN DIMALANTA is a 78 y.o. male was brought to the ER the patient was found to be very weak. As per the family patient has been experiencing diarrhea over the last one week but has not had any nausea vomiting. 2 days ago patient had periumbilical pain which at this time patient states has resolved. The patient developed V-tach arrest on 11/6 and was resuscitated (see notes) intubated and transferred to the ICU; extubated 11/7.  Had another episode V-tach 11/8.     Clinical Impression   Pt admitted with above.  OT eval limited to EOB sitting.  Pt donned pants over feet with sats dropping to 84% on 6L.  Pt instructed in pursed lip breathing with sats ranging from 83-85% on 6L.  Pt returned to supine and RN notified.  Upon return to supine, sats decreased to 78%, then ranged from 82-86% on 6L.  RN placed pt on venti mask with 50% FIO2 with sats up to 87%.   Pt presents to OT with the below listed deficits.  He will benefit from continued OT to maximize safety and independence with BADLs.  He will likely need 24 hour assist at discharge.  Currently, recommending HHOT at discharge, but could change based on progress.     OT Assessment  Patient needs continued OT Services    Follow Up Recommendations  Home health OT;Supervision/Assistance - 24 hour    Barriers to Discharge      Equipment Recommendations  3 in 1 bedside comode    Recommendations for Other Services    Frequency  Min 2X/week    Precautions / Restrictions Precautions Precautions: Fall;Other (comment) Precaution Comments: monitor 02 sats   Pertinent Vitals/Pain    ADL  Eating/Feeding: Set up Where Assessed - Eating/Feeding: Bed level Grooming: Wash/dry hands;Wash/dry face;Set up Where  Assessed - Grooming: Supine, head of bed up Upper Body Bathing: Maximal assistance Where Assessed - Upper Body Bathing: Supine, head of bed up Lower Body Bathing: Maximal assistance Where Assessed - Lower Body Bathing: Supine, head of bed up;Rolling right and/or left Upper Body Dressing: Maximal assistance Where Assessed - Upper Body Dressing: Unsupported sitting Lower Body Dressing: Minimal assistance Where Assessed - Lower Body Dressing: Supine, head of bed up;Rolling right and/or left Toilet Transfer: +1 Total assistance (unable due to decreased sats) Toileting - Clothing Manipulation and Hygiene: +1 Total assistance Where Assessed - Toileting Clothing Manipulation and Hygiene: Supine, head of bed flat;Rolling right and/or left Transfers/Ambulation Related to ADLs: not attempted due to 02 desat ADL Comments: Pt moved to EOB.  Pt donned pants over feet with 02 sats dropping to 84% on 6L.  Pt instructed in pursed lip breathing with 02 sats ranging between 82-85% on 6L.  Pt returned to supine with sats decreasing to 78% and increasing slowly to 83-85%.  RN notified and pt placed on venti mask 50% FIO2 sats very slowly climbed to 87%.    OT Diagnosis: Generalized weakness;Acute pain  OT Problem List: Decreased strength;Decreased activity tolerance;Impaired balance (sitting and/or standing);Decreased safety awareness;Decreased knowledge of use of DME or AE;Cardiopulmonary status limiting activity;Pain OT Treatment Interventions: Self-care/ADL training;Therapeutic exercise;DME and/or AE instruction;Therapeutic activities;Patient/family education;Balance training   OT Goals(Current goals can be found in the care plan section) Acute Rehab OT Goals Patient Stated Goal: I want  to walk OT Goal Formulation: With patient/family Time For Goal Achievement: 07/08/13 Potential to Achieve Goals: Fair ADL Goals Pt Will Perform Grooming: with supervision;sitting Pt Will Perform Upper Body Bathing: with  set-up;with supervision;sitting Pt Will Perform Lower Body Bathing: sit to/from stand;with min guard assist Pt Will Perform Upper Body Dressing: with set-up;with supervision;sitting Pt Will Perform Lower Body Dressing: sit to/from stand;with min guard assist Pt Will Transfer to Toilet: with min guard assist;ambulating;regular height toilet;bedside commode;grab bars Pt Will Perform Toileting - Clothing Manipulation and hygiene: with min guard assist;sit to/from stand Pt/caregiver will Perform Home Exercise Program: Increased strength;Both right and left upper extremity;With theraband;Independently Additional ADL Goal #1: Pt will be independent with energy conservation techniques  Visit Information  Last OT Received On: 06/24/13 Assistance Needed: +2 (for safety) PT/OT Co-Evaluation/Treatment: Yes History of Present Illness: KIMI KROFT is a 78 y.o. male was brought to the ER the patient was found to be very weak. As per the family patient has been experiencing diarrhea over the last one week but has not had any nausea vomiting. 2 days ago patient had periumbilical pain which at this time patient states has resolved. The patient developed V-tach arrest on 11/6 and was resuscitated (see notes) intubated and transferred to the ICU; extubated 11/7.  Had another episode V-tach 11/8.         Prior Functioning     Home Living Family/patient expects to be discharged to:: Private residence Available Help at Discharge: Family;Available 24 hours/day Type of Home: House Home Access: Stairs to enter [employer] of Steps: 1 Entrance Stairs-Rails: None Home Layout: One level Home Equipment: Walker - 2 wheels;Wheelchair - power;Tub bench Prior Function Level of Independence: Independent with assistive device(s) Comments: Dtr reports that pt with limited activity.  He performed self care activities, but only walked from bedroom to kitchen to bathroom Communication Communication: No  difficulties Dominant Hand: Right         Vision/Perception     Cognition  Cognition Arousal/Alertness: Awake/alert Behavior During Therapy: WFL for tasks assessed/performed Overall Cognitive Status: Within Functional Limits for tasks assessed    Extremity/Trunk Assessment Upper Extremity Assessment Upper Extremity Assessment: Generalized weakness Lower Extremity Assessment Lower Extremity Assessment: Defer to PT evaluation     Mobility Bed Mobility Bed Mobility: Supine to Sit;Sitting - Scoot to [place] of Bed;Sit to Supine;Scooting to [facility] Supine to Sit: 4: Min guard Sitting - Scoot to [place] of Bed: 4: Min guard Sit to Supine: 4: Min guard Scooting to HOB: 1: +2 Total assist Scooting to [facility]: Patient Percentage: 40% Details for Bed Mobility Assistance: min guard for safety  Transfers Transfers: Not assessed     Exercise     Balance     End of Session OT - End of Session Activity Tolerance: Treatment limited secondary to medical complications (Comment) Patient left: in bed;with call bell/phone within reach;with nursing/sitter in room;with family/visitor present Nurse Communication: Other (comment) (02 sats)  GO     Tymeir Weathington M 06/24/2013, 12:50 PM

## 2013-06-25 ENCOUNTER — Inpatient Hospital Stay (HOSPITAL_COMMUNITY): Payer: Medicare Other

## 2013-06-25 LAB — BASIC METABOLIC PANEL
Calcium: 8.5 mg/dL (ref 8.4–10.5)
Chloride: 95 mEq/L — ABNORMAL LOW (ref 96–112)
Creatinine, Ser: 1.19 mg/dL (ref 0.50–1.35)
GFR calc Af Amer: 67 mL/min — ABNORMAL LOW (ref >90)
GFR calc non Af Amer: 58 mL/min — ABNORMAL LOW (ref >90)
Potassium: 4.7 mEq/L (ref 3.5–5.1)
Sodium: 136 mEq/L (ref 135–145)

## 2013-06-25 MED ORDER — LISINOPRIL 2.5 MG PO TABS
2.5000 mg | ORAL_TABLET | Freq: Every day | ORAL | Status: DC
  Administered 2013-06-25 – 2013-06-27 (×2): 2.5 mg via ORAL
  Filled 2013-06-25 (×3): qty 1

## 2013-06-25 NOTE — Progress Notes (Addendum)
     SUBJECTIVE: No chest pain. Still dyspneic.   BP 110/43  Pulse 89  Temp(Src) 98.2 F (36.8 C) (Oral)  Resp 19  Ht 6' (1.829 m)  Wt 179 lb 14.3 oz (81.6 kg)  BMI 24.39 kg/m2  SpO2 96%  Intake/Output Summary (Last 24 hours) at 06/25/13 0729 Last data filed at 06/25/13 0600  Gross per 24 hour  Intake 1180.1 ml  Output   4095 ml  Net -2914.9 ml    PHYSICAL EXAM General: Well developed, well nourished, in no acute distress. Alert and oriented x 3.  Psych:  Good affect, responds appropriately Neck: No JVD. No masses noted.  Lungs: Coarse BS bilaterally with no wheezes or rhonci noted.  Heart: RRR with no murmurs noted. Abdomen: Bowel sounds are present. Soft, non-tender.  Extremities: No lower extremity edema.   LABS: Basic Metabolic Panel:  Recent Labs  40/98/11 0500 06/24/13 0500 06/25/13 0531  NA 137 136 136  K 3.9 4.3 4.7  CL 98 100 95*  CO2 33* 31 34*  GLUCOSE 94 87 98  BUN 9 10 14   CREATININE 0.86 0.97 1.19  CALCIUM 8.1* 7.8* 8.5  MG 1.5 1.6 1.7  PHOS 3.0 2.6  --     Current Meds: . amiodarone  400 mg Oral BID  . antiseptic oral rinse  15 mL Mouth Rinse BID  . aspirin  81 mg Oral Daily  . carvedilol  3.125 mg Oral BID WC  . cilostazol  100 mg Oral BID  . clopidogrel  75 mg Oral Q breakfast  . enoxaparin (LOVENOX) injection  40 mg Subcutaneous Q24H  . ferrous sulfate  325 mg Oral TID WC  . furosemide  60 mg Intravenous TID  . magnesium sulfate 1 - 4 g bolus IVPB  2 g Intravenous Daily  . potassium & sodium phosphates  2 packet Oral TID WC & HS  . potassium chloride  40 mEq Oral TID  . sertraline  100 mg Oral Daily  . sodium chloride  3 mL Intravenous Q12H     ASSESSMENT AND PLAN:  1. Wide complex tachycardia/VT: No recurrence last 72 hours. This appears to be atrial fib with bundle branch making it appear wide. Now on amiodarone po. I have discussed anti-coagulation with pt and wife and they do not wish to pursue anticoagulation after  discharge. He refuses to consider coumadin or a newer agent.   2. CAD: Known to have severe CAD s/p CABG with no good options for PCI at last cath in May 2014. He is not having chest pain. At this point, will continue medical therapy with ASA, Plavix, Coreg. Lipitor on hold due to transaminitis from shock liver, which can be restarted in future.   3. Chronic systolic heart failure, EF 35-40%: Volume overloaded but with good diuresis yesterday. He has chronic dyspnea due to COPD but weight is up and overall net positive since admission. (13 liters). Continue IV Lasix today. Repeat CXR today.   4. Tobacco abuse: Cessation recommended.     [name]  11/12/20147:29 AM

## 2013-06-25 NOTE — Clinical Social Work Note (Signed)
Per RNCM, pt going home with home health. CSW signing off.   Maryclare Labrador, MSW, [facility] Clinical Social Worker [phone]

## 2013-06-25 NOTE — Care Management Note (Addendum)
    Page 1 of 2   06/29/2013     11:23:10 AM   CARE MANAGEMENT NOTE 06/29/2013  Patient:  [name], [name]   Account Number:  [id number]  Date Initiated:  06/17/2013  Documentation initiated by:  [facility]  Subjective/Objective Assessment:   Presented with weakness and hypotension.  Increased bun/creat./ home with wife     Action/Plan:   //bolus home with [facility]   Anticipated DC Date:  06/28/2013   Anticipated DC Plan:  HOME W HOME HEALTH SERVICES  In-house referral  Clinical Social Worker      DC Associate Professor  CM consult      [facility] Choice  HOME HEALTH   Choice offered to / List presented to:  C-3 Spouse        HH arranged  HH-2 PT      [facility] agency  [facility]   Status of service:  Completed, signed off Medicare Important Message given?   (If response is "NO", the following Medicare IM given date fields will be blank) Date Medicare IM given:   Date Additional Medicare IM given:    Discharge Disposition:  HOME W HOME HEALTH SERVICES  Per UR Regulation:  Reviewed for med. necessity/level of care/duration of stay  If discussed at Long Length of Stay Meetings, dates discussed:   06/24/2013  06/26/2013    Comments:  06/29/13 11:15 CM notified Genevieve Norlander of pt discharge.  No other CM needs were communicated.  Freddy Jaksch, BSN, CM [phone].  [employer], [place] [phone] 06/27/13 1115 Camellia Lucretia Roers, RN, BSN, [place] [phone] Spoke with Ayesha Rumpf, RN of [facility]. Confirmed that pt can recieve services of PT upon discharge home.  Dr. Robb Matar entered face to face [facility] orders.  11/13 1116a debbie dowell rn,bsn spoke w pt. he and wife would like [facility] for hhc. spoke w [facility] and they are checking to be sure they can take his ins. explained to pt would ck w [facility] to be sure they take his ins.  11/12 1057a debbie dowell rn,bsn spoke w pt and da.he thinks he has hhc but has been several years. he may need [id number]-poss oxygen. await  orders and have left hhc agency list for pt and wife.  11/11  1111 debbie dowell rn,bsn pt weak and refused to ge tout of bed on 11-10 per nse. await phy ther eval for rec for dc planning. will make sw ref in case snf needed.  11/6 pt transf to ccu and intubated.

## 2013-06-25 NOTE — Progress Notes (Signed)
[facility] Progress Note [name] TEAM 1 - Stepdown ICU Team   [name] WUJ:[id number] DOB: 06-03-1935 DOA: 06/16/2013 PCP: [name], MD  Brief narrative: 78 y.o. male who was brought to the ER after the patient was found to be very weak. As per the family patient had been experiencing diarrhea over the last one week but had not had any nausea or vomiting. Two days prior to admission the patient had periumbilical pain which has subsequently resolved. He had not been eating well for 2 days. Apparently other family members had similar GI illnesses or an. In the ER patient was found initially to be hypotensive and was given 2 L normal saline bolus followed by another 2 L. After the fluid boluses patient's blood pressure improved. Lactic acid levels have been normal. Patient's creatinine was markedly increased from baseline with an associated metabolic acidosis. Recently the patient had been having increasing hallucinations and was started on Risperdal by his primary care physician.  The patient developed V-tach arrest on 11/6 and was resuscitated (see notes) intubated and transferred to the ICU.  He was successfully extubated on 11/7 and transferred back to SDU.  ECHO revealed EF of 25-35%. He had another episode of V-tach on 11/8 in the AM at which point Amio infusion was increased.  His electrolytes have been difficult to manage with ongoing diarrhea and poor PO intake.   Assessment/Plan:  V tach vs aberrantly conducted A-fib - in setting of electrolyte abnormalities and an underlying cardiomyopathy - EF 30% - Amio infusion was increased after another bolus of Amio due to another episode of V-tach (on 11/8 AM) when pt was assisted to the bedside commode-transitioned to PO Amiodarone 11/11 -Cards feels VT was actually AF in setting of RBBB leading to appearance of VT - mobilize cautiously with PT - Cardiology following  -pt/and family refuse anti coagulation with either coumadin or  NACA  Acute Hypoxic Respiratory Failure/Systolic CHF- EF 95%/AOZH/YQM -requiring VM oxygen-desats with mobilization -likely due to volume overload in setting recent volume replacement and depressed systolic function - management per Cardiology - given Lasix 11/9 but still 15 L positive so added Lasix 60 mg IV BID on 11/10 and increased Kdur to 60 meq- on 11/11 still overloaded and becoming quite hypoxic on movement therefore, Lasix increased to TID with improved diuresis so will continue same dose -resume very low dose ACE I and cont Coreg -review of prior CXR show ILD pattern- pt with ongoing tobacco and prior work exposure to asbestos -will need ambulatory sats when effectively diuresed -recommend PCP refer to pulmonologist after dc   Norovirus induced enteritis -had watery diarrhea 11/9 and suspect due to oral Mg which was started recently so was dc'd -GI pathogen panel positive for Norovirus -urinary Legionella negative -cont supportive care   Acute renal failure/Metabolic acidosis   - resolved with hydration but then developed sx's c/w pulmonary edema so IVFs were dc'd and she was given a dose of Lasix  -baseline renal function 8 / 0.85 -etiology likely pre renal with ATN from hypotension so ACE I held- given current edema and new systolic dysfunction would like to resume   Hypokalemia/Hypophos/ Hypomag - cont to check daily and replace prn    HTN  -BP soft   - on Coreg-resuming ACE I 11/12   CORONARY ARTERY DISEASE -per Cardiology    Transaminitis  -due to low perfusion/hypotension (shock liver) -resolved so can resume Lipitor at dc    Unspecified Iron Deficiency Anemia  RENAL ARTERY STENOSIS    COPD  DVT prophylaxis: SCDs Code Status: Full Family Communication: Patient Disposition Plan/Expected LOS: Stepdown Isolation: Contact isolation  Consultants: None  Procedures: 2D Echocardiogram Left ventricle: LVEF is approximately 35 to 40% with akinesis of the  base/mid inferior/inferoseptal and posterior walls; The cavity size was severely dilated. Wall thickness was normal.   Antibiotics: Cipro 11/4 >>>11/5 Flagyl 11/4 >>>11/5  HPI/Subjective: Pt alert-appetite has returned-d/w pt likley will need chronic O2 at home due to previously undx'd ILD  Objective: Blood pressure 110/43, pulse 89, temperature 98 F (36.7 C), temperature source Oral, resp. rate 19, height 6' (1.829 m), weight 179 lb 14.3 oz (81.6 kg), SpO2 94.00%.  Intake/Output Summary (Last 24 hours) at 06/25/13 1232 Last data filed at 06/25/13 1000  Gross per 24 hour  Intake   1140 ml  Output   3395 ml  Net  -2255 ml   Exam: General: Mild acute acute respiratory distress Lungs: Scattered rhonchi- R>L; VM  Cardiovascular: Regular rate and rhythm without murmur gallop or rub normal S1 and S2, no peripheral edema or JVD Abdomen: Nontender, nondistended, soft, bowel sounds positive, no rebound, no ascites, no appreciable mass Musculoskeletal: No significant cyanosis, clubbing of bilateral lower extremities Neurological: Awake and oriented , moves all extremities x 4 without focal neurological deficits, CN 2-12 intact  Scheduled Meds:  Scheduled Meds: . amiodarone  400 mg Oral BID  . antiseptic oral rinse  15 mL Mouth Rinse BID  . aspirin  81 mg Oral Daily  . carvedilol  3.125 mg Oral BID WC  . cilostazol  100 mg Oral BID  . clopidogrel  75 mg Oral Q breakfast  . enoxaparin (LOVENOX) injection  40 mg Subcutaneous Q24H  . ferrous sulfate  325 mg Oral TID WC  . furosemide  60 mg Intravenous TID  . magnesium sulfate 1 - 4 g bolus IVPB  2 g Intravenous Daily  . potassium & sodium phosphates  2 packet Oral TID WC & HS  . potassium chloride  40 mEq Oral TID  . sertraline  100 mg Oral Daily  . sodium chloride  3 mL Intravenous Q12H   Data Reviewed: Basic Metabolic Panel:  Recent Labs Lab 06/21/13 0406 06/22/13 0500 06/22/13 1754 06/23/13 0500 06/24/13 0500  06/25/13 0531  NA 137 139  --  137 136 136  K 4.2 3.3*  --  3.9 4.3 4.7  CL 100 102  --  98 100 95*  CO2 26 30  --  33* 31 34*  GLUCOSE 136* 98  --  94 87 98  BUN 10 8  --  9 10 14   CREATININE 0.89 0.79  --  0.86 0.97 1.19  CALCIUM 8.3* 7.8*  --  8.1* 7.8* 8.5  MG 1.7 1.5  --  1.5 1.6 1.7  PHOS 3.0 2.8 3.1 3.0 2.6  --     Liver Function Tests:  Recent Labs Lab 06/19/13 0357 06/19/13 2036 06/24/13 0500  AST 58*  --  24  ALT 11  --  16  ALKPHOS 143*  --  132*  BILITOT 0.4  --  0.3  PROT 5.3*  --  4.6*  ALBUMIN 2.3* 2.1* 1.6*   No results found for this basename: AMMONIA,  in the last 168 hours CBC:  Recent Labs Lab 06/21/13 0401 06/21/13 0406 06/22/13 0500  WBC  --  14.4* 10.9*  HGB 10.9* 10.8* 9.8*  HCT 32.0* 32.0* 29.2*  MCV  --  90.7 91.5  PLT  --  180 159   Cardiac Enzymes:  Recent Labs Lab 06/19/13 1256 06/19/13 2036 06/20/13 0045 06/20/13 1127 06/20/13 1815 06/20/13 2327  CKTOTAL 211  --   --   --   --   --   CKMB 16.7*  --   --   --   --   --   TROPONINI 4.53* 5.16* 5.46* 3.68* 3.53* 3.00*   BNP (last 3 results)  Recent Labs  12/23/12 2045  PROBNP 1188.0*   CBG:  Recent Labs Lab 06/19/13 2035 06/19/13 2343 06/20/13 0337 06/20/13 0932  GLUCAP 117* 103* 117* 88    Recent Results (from the past 240 hour(s))  CLOSTRIDIUM DIFFICILE BY PCR     Status: None   Collection Time    06/17/13  1:51 AM      Result Value Range Status   C difficile by pcr NEGATIVE  NEGATIVE Final  MRSA PCR SCREENING     Status: None   Collection Time    06/17/13  4:27 AM      Result Value Range Status   MRSA by PCR NEGATIVE  NEGATIVE Final   Comment:            The GeneXpert MRSA Assay (FDA     approved for NASAL specimens     only), is one component of a     comprehensive MRSA colonization     surveillance program. It is not     intended to diagnose MRSA     infection nor to guide or     monitor treatment for     MRSA infections.  CULTURE, BLOOD  (ROUTINE X 2)     Status: None   Collection Time    06/17/13  7:40 AM      Result Value Range Status   Specimen Description BLOOD LEFT HAND   Final   Special Requests BOTTLES DRAWN AEROBIC AND ANAEROBIC 5CC   Final   Culture  Setup Time     Final   Value: 06/17/2013 13:55     Performed at [facility]   Culture     Final   Value: NO GROWTH 5 DAYS     Performed at [facility]   Report Status 06/23/2013 FINAL   Final  CULTURE, BLOOD (ROUTINE X 2)     Status: None   Collection Time    06/17/13  7:55 AM      Result Value Range Status   Specimen Description BLOOD LEFT HAND   Final   Special Requests BOTTLES DRAWN AEROBIC AND ANAEROBIC 5CC   Final   Culture  Setup Time     Final   Value: 06/17/2013 13:55     Performed at [facility]   Culture     Final   Value: NO GROWTH 5 DAYS     Performed at [facility]   Report Status 06/23/2013 FINAL   Final  CULTURE, EXPECTORATED SPUTUM-ASSESSMENT     Status: None   Collection Time    06/21/13  1:00 PM      Result Value Range Status   Specimen Description SPUTUM   Final   Special Requests Normal   Final   Sputum evaluation     Final   Value: THIS SPECIMEN IS ACCEPTABLE. RESPIRATORY CULTURE REPORT TO FOLLOW.   Report Status 06/21/2013 FINAL   Final  CULTURE, RESPIRATORY (NON-EXPECTORATED)     Status: None   Collection Time    06/21/13  1:00 PM      Result Value Range Status   Specimen Description SPUTUM   Final   Special Requests NONE   Final   Gram Stain     Final   Value: NO WBC SEEN     NO SQUAMOUS EPITHELIAL CELLS SEEN     FEW GRAM POSITIVE COCCI     IN PAIRS     Performed at [facility]   Culture     Final   Value: NORMAL OROPHARYNGEAL FLORA     Performed at [facility]   Report Status 06/24/2013 FINAL   Final      Junious Silk, ANP [phone]  On-Call/Text Page:      [email or web address]      password [email or web address]    I have personally examined the patient and reviewed the entire  database. Agree with the above note and plan as outlined, any necessary changes made.  RAI,RIPUDEEP M.D. [facility] 06/25/2013, 1:53 PM Pager: [phone]

## 2013-06-26 LAB — BASIC METABOLIC PANEL
BUN: 21 mg/dL (ref 6–23)
Calcium: 8.8 mg/dL (ref 8.4–10.5)
Creatinine, Ser: 1.38 mg/dL — ABNORMAL HIGH (ref 0.50–1.35)
GFR calc Af Amer: 56 mL/min — ABNORMAL LOW (ref >90)
GFR calc non Af Amer: 48 mL/min — ABNORMAL LOW (ref >90)
Glucose, Bld: 90 mg/dL (ref 70–99)
Potassium: 4.8 mEq/L (ref 3.5–5.1)
Sodium: 135 mEq/L (ref 135–145)

## 2013-06-26 LAB — MAGNESIUM: Magnesium: 1.9 mg/dL (ref 1.5–2.5)

## 2013-06-26 MED ORDER — FUROSEMIDE 40 MG PO TABS
40.0000 mg | ORAL_TABLET | Freq: Every day | ORAL | Status: DC
  Administered 2013-06-26: 40 mg via ORAL
  Filled 2013-06-26 (×2): qty 1

## 2013-06-26 MED ORDER — ATORVASTATIN CALCIUM 40 MG PO TABS
40.0000 mg | ORAL_TABLET | Freq: Every day | ORAL | Status: DC
  Administered 2013-06-26 – 2013-06-27 (×2): 40 mg via ORAL
  Filled 2013-06-26 (×4): qty 1

## 2013-06-26 MED ORDER — INFLUENZA VAC SPLIT QUAD 0.5 ML IM SUSP
0.5000 mL | INTRAMUSCULAR | Status: AC
  Administered 2013-06-27: 0.5 mL via INTRAMUSCULAR
  Filled 2013-06-26: qty 0.5

## 2013-06-26 NOTE — Progress Notes (Signed)
[facility] Progress Note [place] TEAM 1 - Stepdown ICU Team   [name]:[id number] DOB: 12-16-1934 DOA: 06/16/2013 PCP: [name], MD  Brief narrative: 78 y.o. male who was brought to the ER after the patient was found to be very weak. As per the family patient had been experiencing diarrhea over the last one week but had not had any nausea or vomiting. Two days prior to admission the patient had periumbilical pain which has subsequently resolved. He had not been eating well for 2 days. Apparently other family members had similar GI illnesses or an. In the ER patient was found initially to be hypotensive and was given 2 L normal saline bolus followed by another 2 L. After the fluid boluses patient's blood pressure improved. Lactic acid levels have been normal. Patient's creatinine was markedly increased from baseline with an associated metabolic acidosis. Recently the patient had been having increasing hallucinations and was started on Risperdal by his primary care physician.  The patient developed V-tach arrest on 11/6 and was resuscitated (see notes) intubated and transferred to the ICU.  He was successfully extubated on 11/7 and transferred back to SDU.  ECHO revealed EF of 25-35%. He had another episode of V-tach on 11/8 in the AM at which point Amio infusion was increased.  His electrolytes have been difficult to manage with ongoing diarrhea and poor PO intake.   Assessment/Plan:  V tach vs aberrantly conducted A-fib - in setting of electrolyte abnormalities and an underlying cardiomyopathy - EF 30% - Amio infusion was increased after another bolus of Amio due to another episode of V-tach (on 11/8 AM) when pt was assisted to the bedside commode-transitioned to PO Amiodarone 11/11 -Cards feels VT was actually AF in setting of RBBB leading to appearance of VT - Cardiology following  -pt/and family refuse anti coagulation with either coumadin or NACA  Acute Hypoxic  Respiratory Failure/Systolic CHF- EF 81%/XBJY/NWG -requiring VM oxygen with mobilization but o/w has weaned to 4L at rest -likely due to volume overload in setting recent volume replacement and depressed systolic function - management per Cardiology - given Lasix 11/9 but still 15 L positive so added Lasix 60 mg IV BID on 11/10 and increased Kdur to 60 meq- on 11/11 still overloaded and becoming quite hypoxic on movement therefore, Lasix increased to TID with improved diuresis so will continue same dose -resume very low dose ACE I and cont Coreg -review of prior CXR show ILD pattern- pt with ongoing tobacco and prior work exposure to asbestos -will need ambulatory sats when effectively diuresed -recommend PCP refer to pulmonologist after dc   Norovirus induced enteritis -had watery diarrhea 11/9 and suspect due to oral Mg which was started recently so was dc'd -GI pathogen panel positive for Norovirus -urinary Legionella negative -cont supportive care   Acute renal failure/Metabolic acidosis   - resolved with hydration but then developed sx's c/w pulmonary edema so IVFs were dc'd and she was given a dose of Lasix  -baseline renal function 8 / 0.85 -etiology likely pre renal with ATN from hypotension so ACE I held- given current edema and new systolic dysfunction was resumed 11/12 with subtle increase in Scr- follow close   Hypokalemia/Hypophos/ Hypomag - cont to check daily and replace prn    HTN  -BP soft   - on Coreg-resumed ACE I on 11/12   CORONARY ARTERY DISEASE -per Cardiology    Transaminitis  -due to low perfusion/hypotension (shock liver) -resolved so can resume  Lipitor at dc    Unspecified Iron Deficiency Anemia    RENAL ARTERY STENOSIS    COPD  DVT prophylaxis: SCDs Code Status: Full Family Communication: Patient Disposition Plan/Expected LOS: Stepdown Isolation: Contact isolation  Consultants: None  Procedures: 2D Echocardiogram Left ventricle: LVEF is  approximately 35 to 40% with akinesis of the base/mid inferior/inferoseptal and posterior walls; The cavity size was severely dilated. Wall thickness was normal.   Antibiotics: Cipro 11/4 >>>11/5 Flagyl 11/4 >>>11/5  HPI/Subjective: Pt alert-no complaints- up in a chair  Objective: Blood pressure 95/57, pulse 94, temperature 98.2 F (36.8 C), temperature source Oral, resp. rate 18, height 6' (1.829 m), weight 179 lb 14.3 oz (81.6 kg), SpO2 88.00%.  Intake/Output Summary (Last 24 hours) at 06/26/13 1542 Last data filed at 06/26/13 1300  Gross per 24 hour  Intake   1410 ml  Output   3000 ml  Net  -1590 ml   Exam: General: Mild acute acute respiratory distress Lungs: Scattered rhonchi- R>L; [place] Cardiovascular: Regular rate and rhythm without murmur gallop or rub normal S1 and S2, no peripheral edema or JVD Abdomen: Nontender, nondistended, soft, bowel sounds positive, no rebound, no ascites, no appreciable mass Musculoskeletal: No significant cyanosis, clubbing of bilateral lower extremities Neurological: Awake and oriented , moves all extremities x 4 without focal neurological deficits, CN 2-12 intact  Scheduled Meds:  Scheduled Meds: . amiodarone  400 mg Oral BID  . antiseptic oral rinse  15 mL Mouth Rinse BID  . aspirin  81 mg Oral Daily  . atorvastatin  40 mg Oral q1800  . carvedilol  3.125 mg Oral BID WC  . cilostazol  100 mg Oral BID  . clopidogrel  75 mg Oral Q breakfast  . enoxaparin (LOVENOX) injection  40 mg Subcutaneous Q24H  . ferrous sulfate  325 mg Oral TID WC  . furosemide  40 mg Oral Daily  . [START ON 06/27/2013] influenza vac split quadrivalent PF  0.5 mL Intramuscular Tomorrow-1000  . lisinopril  2.5 mg Oral Daily  . magnesium sulfate 1 - 4 g bolus IVPB  2 g Intravenous Daily  . potassium & sodium phosphates  2 packet Oral TID WC & HS  . potassium chloride  40 mEq Oral TID  . sertraline  100 mg Oral Daily  . sodium chloride  3 mL Intravenous Q12H    Data Reviewed: Basic Metabolic Panel:  Recent Labs Lab 06/21/13 0406 06/22/13 0500 06/22/13 1754 06/23/13 0500 06/24/13 0500 06/25/13 0531 06/26/13 0355  NA 137 139  --  137 136 136 135  K 4.2 3.3*  --  3.9 4.3 4.7 4.8  CL 100 102  --  98 100 95* 92*  CO2 26 30  --  33* 31 34* 36*  GLUCOSE 136* 98  --  94 87 98 90  BUN 10 8  --  9 10 14 21   CREATININE 0.89 0.79  --  0.86 0.97 1.19 1.38*  CALCIUM 8.3* 7.8*  --  8.1* 7.8* 8.5 8.8  MG 1.7 1.5  --  1.5 1.6 1.7 1.9  PHOS 3.0 2.8 3.1 3.0 2.6  --   --     Liver Function Tests:  Recent Labs Lab 06/19/13 2036 06/24/13 0500  AST  --  24  ALT  --  16  ALKPHOS  --  132*  BILITOT  --  0.3  PROT  --  4.6*  ALBUMIN 2.1* 1.6*   No results found for this basename: AMMONIA,  in  the last 168 hours CBC:  Recent Labs Lab 06/21/13 0401 06/21/13 0406 06/22/13 0500  WBC  --  14.4* 10.9*  HGB 10.9* 10.8* 9.8*  HCT 32.0* 32.0* 29.2*  MCV  --  90.7 91.5  PLT  --  180 159   Cardiac Enzymes:  Recent Labs Lab 06/19/13 2036 06/20/13 0045 06/20/13 1127 06/20/13 1815 06/20/13 2327  TROPONINI 5.16* 5.46* 3.68* 3.53* 3.00*   BNP (last 3 results)  Recent Labs  12/23/12 2045  PROBNP 1188.0*   CBG:  Recent Labs Lab 06/19/13 2035 06/19/13 2343 06/20/13 0337 06/20/13 0932  GLUCAP 117* 103* 117* 88    Recent Results (from the past 240 hour(s))  CLOSTRIDIUM DIFFICILE BY PCR     Status: None   Collection Time    06/17/13  1:51 AM      Result Value Range Status   C difficile by pcr NEGATIVE  NEGATIVE Final  MRSA PCR SCREENING     Status: None   Collection Time    06/17/13  4:27 AM      Result Value Range Status   MRSA by PCR NEGATIVE  NEGATIVE Final   Comment:            The GeneXpert MRSA Assay (FDA     approved for NASAL specimens     only), is one component of a     comprehensive MRSA colonization     surveillance program. It is not     intended to diagnose MRSA     infection nor to guide or     monitor  treatment for     MRSA infections.  CULTURE, BLOOD (ROUTINE X 2)     Status: None   Collection Time    06/17/13  7:40 AM      Result Value Range Status   Specimen Description BLOOD LEFT HAND   Final   Special Requests BOTTLES DRAWN AEROBIC AND ANAEROBIC 5CC   Final   Culture  Setup Time     Final   Value: 06/17/2013 13:55     Performed at [facility]   Culture     Final   Value: NO GROWTH 5 DAYS     Performed at [facility]   Report Status 06/23/2013 FINAL   Final  CULTURE, BLOOD (ROUTINE X 2)     Status: None   Collection Time    06/17/13  7:55 AM      Result Value Range Status   Specimen Description BLOOD LEFT HAND   Final   Special Requests BOTTLES DRAWN AEROBIC AND ANAEROBIC 5CC   Final   Culture  Setup Time     Final   Value: 06/17/2013 13:55     Performed at [facility]   Culture     Final   Value: NO GROWTH 5 DAYS     Performed at [facility]   Report Status 06/23/2013 FINAL   Final  CULTURE, EXPECTORATED SPUTUM-ASSESSMENT     Status: None   Collection Time    06/21/13  1:00 PM      Result Value Range Status   Specimen Description SPUTUM   Final   Special Requests Normal   Final   Sputum evaluation     Final   Value: THIS SPECIMEN IS ACCEPTABLE. RESPIRATORY CULTURE REPORT TO FOLLOW.   Report Status 06/21/2013 FINAL   Final  CULTURE, RESPIRATORY (NON-EXPECTORATED)     Status: None   Collection Time  06/21/13  1:00 PM      Result Value Range Status   Specimen Description SPUTUM   Final   Special Requests NONE   Final   Gram Stain     Final   Value: NO WBC SEEN     NO SQUAMOUS EPITHELIAL CELLS SEEN     FEW GRAM POSITIVE COCCI     IN PAIRS     Performed at [facility]   Culture     Final   Value: NORMAL OROPHARYNGEAL FLORA     Performed at [facility]   Report Status 06/24/2013 FINAL   Final      Junious Silk, ANP [phone]  On-Call/Text Page:      [email or web address]      password [email or web address]   I have examined  the patient, reviewed the chart and modified the above note which I agree with.   Almyra Birman,MD [phone] 06/26/2013, 5:22 PM

## 2013-06-26 NOTE — Progress Notes (Addendum)
Pt states feeling nauseas PRN given . Per IV team Rt IJ is not in the SVC it is in the distal IJ. IV recommends removing IJ. MD paged . Will continue  To monitor

## 2013-06-26 NOTE — Progress Notes (Signed)
Physical Therapy Treatment Patient Details Name: [name] MRN: [id number] DOB: 11/17/1934 Today's Date: 06/26/2013 Time: 4098-1191 [name] Time Calculation (min): 28 min  [name] Assessment / Plan / Recommendation  History of Present Illness [name] is a 78 y.o. male was brought to the ER the patient was found to be very weak. As per the family patient has been experiencing diarrhea over the last one week but has not had any nausea vomiting. 2 days ago patient had periumbilical pain which at this time patient states has resolved. The patient developed V-tach arrest on 11/6 and was resuscitated (see notes) intubated and transferred to the ICU; extubated 11/7.  Had another episode V-tach 11/8.     [name] Comments   [name] progressing with mobility with venturi mask. On arrival [name] 90% at rest on 5L and switched [name] to 40% venturi and down to 30% after pivot with [name] able to maintain 89-94% with activity on 30% venturi with cues for breathing technique. [name] educated for HEp and encouraged to continue performing and increase mobility with nursing. Returned [name] to [place] at 6L end of session with sats 93%. Will continue to follow.   Follow Up Recommendations  Home health [name];Supervision/Assistance - 24 hour     Does the patient have the potential to tolerate intense rehabilitation     Barriers to Discharge        Equipment Recommendations       Recommendations for Other Services    Frequency     Progress towards [name] Goals Progress towards [name] goals: Progressing toward goals  Plan Current plan remains appropriate    Precautions / Restrictions Precautions Precautions: Fall;Other (comment) Precaution Comments: monitor 02 sats Restrictions Weight Bearing Restrictions: No   Pertinent Vitals/Pain No pain HR 114    Mobility  Bed Mobility Bed Mobility: Rolling Right;Right Sidelying to Sit;Sitting - Scoot to Edge of Bed Rolling Right: 5: Supervision Right Sidelying to Sit: 4: Min assist;HOB flat;With  rails Sitting - Scoot to Edge of Bed: 4: Min guard Details for Bed Mobility Assistance: cueing for safety and sequence Transfers Transfers: Sit to Stand;Stand to Sit;Stand Pivot Transfers Sit to Stand: 4: Min assist;From bed;From chair/3-in-1;With armrests Stand to Sit: 4: Min guard;To chair/3-in-1;With armrests Details for Transfer Assistance: cueing for hand placement, safety and sequence with assist for anterior translation Ambulation/Gait Ambulation/Gait Assistance: 4: Min guard Ambulation Distance (Feet): 60 Feet Assistive device: Rolling walker Ambulation/Gait Assistance Details: cueing for posture, position in RW and breathing technique with grossly 45sec standing rest halfway to recover sats from 89% on 30% venturi mask Gait Pattern: Step-through pattern;Decreased stride length;Trunk flexed Gait velocity: decreased Stairs: No    Exercises General Exercises - Lower Extremity Ankle Circles/Pumps: AROM;Seated;Both;20 reps Long Arc Quad: AROM;Seated;Both;15 reps Hip ABduction/ADduction: AROM;Seated;Both;15 reps Hip Flexion/Marching: AROM;Seated;Both;15 reps   [name] Diagnosis:    [name] Problem List:   [name] Treatment Interventions:     [name] Goals (current goals can now be found in the care plan section)    Visit Information  Last [name] Received On: 06/26/13 Assistance Needed: +1 History of Present Illness: QAIS JOWERS is a 78 y.o. male was brought to the ER the patient was found to be very weak. As per the family patient has been experiencing diarrhea over the last one week but has not had any nausea vomiting. 2 days ago patient had periumbilical pain which at this time patient states has resolved. The patient developed V-tach arrest on 11/6 and was resuscitated (see notes)  intubated and transferred to the ICU; extubated 11/7.  Had another episode V-tach 11/8.      Subjective Data      Cognition  Cognition Arousal/Alertness: Awake/alert Behavior During Therapy: WFL for tasks  assessed/performed Overall Cognitive Status: Within Functional Limits for tasks assessed    Balance     End of Session [name] - End of Session Equipment Utilized During Treatment: Gait belt;Oxygen Activity Tolerance: Patient tolerated treatment well Patient left: in chair;with call bell/phone within reach;with family/visitor present Nurse Communication: Mobility status   GP     [name] 06/26/2013, 10:24 AM [name], [name] [phone]

## 2013-06-26 NOTE — Progress Notes (Signed)
     SUBJECTIVE: NO chest pain. Room air sats 91% in bed.   BP 117/74  Pulse 92  Temp(Src) 98.7 F (37.1 C) (Oral)  Resp 30  Ht 6' (1.829 m)  Wt 179 lb 14.3 oz (81.6 kg)  BMI 24.39 kg/m2  SpO2 94%  Intake/Output Summary (Last 24 hours) at 06/26/13 0657 Last data filed at 06/26/13 0400  Gross per 24 hour  Intake   1210 ml  Output   4100 ml  Net  -2890 ml    PHYSICAL EXAM General: Well developed, well nourished, in no acute distress. Alert and oriented x 3.  Psych:  Good affect, responds appropriately Neck: No JVD. No masses noted.  Lungs: Coarse BS bilaterally. No crackles  Heart: RRR with no murmurs noted. Abdomen: Bowel sounds are present. Soft, non-tender.  Extremities: No lower extremity edema.   LABS: Basic Metabolic Panel:  Recent Labs  45/40/98 0500 06/25/13 0531 06/26/13 0355  NA 136 136 135  K 4.3 4.7 4.8  CL 100 95* 92*  CO2 31 34* 36*  GLUCOSE 87 98 90  BUN 10 14 21   CREATININE 0.97 1.19 1.38*  CALCIUM 7.8* 8.5 8.8  MG 1.6 1.7 1.9  PHOS 2.6  --   --    Current Meds: . amiodarone  400 mg Oral BID  . antiseptic oral rinse  15 mL Mouth Rinse BID  . aspirin  81 mg Oral Daily  . carvedilol  3.125 mg Oral BID WC  . cilostazol  100 mg Oral BID  . clopidogrel  75 mg Oral Q breakfast  . enoxaparin (LOVENOX) injection  40 mg Subcutaneous Q24H  . ferrous sulfate  325 mg Oral TID WC  . furosemide  60 mg Intravenous TID  . lisinopril  2.5 mg Oral Daily  . magnesium sulfate 1 - 4 g bolus IVPB  2 g Intravenous Daily  . potassium & sodium phosphates  2 packet Oral TID WC & HS  . potassium chloride  40 mEq Oral TID  . sertraline  100 mg Oral Daily  . sodium chloride  3 mL Intravenous Q12H     ASSESSMENT AND PLAN:  1. Wide complex tachycardia/VT: No recurrence last 4 days. This appears to be atrial fib with bundle branch making it appear wide. Now on amiodarone po. I have discussed anti-coagulation with pt and wife and they do not wish to pursue  anticoagulation after discharge. He refuses to consider coumadin or a newer agent.   2. CAD: Known to have severe CAD s/p CABG with no good options for PCI at last cath in May 2014. He is not having chest pain. At this point, will continue medical therapy with ASA, Plavix, Coreg. Lipitor on hold due to transaminitis from shock liver. LFTs have normalized. Can restart statin.   3. Chronic systolic heart failure, EF 35-40%: Continues to diurese well (6 liters negative last 48 hours). Slight bump in creatinine. Will change to po Lasix today and ambulate with ambulatory O2 sats. He has chronic dyspnea due to COPD. CXR yesterday looks better. Net positive 10 liters since admission but he came in profoundly dehydrated with diarrhea. Change to po Lasix today.   4. Tobacco abuse: Cessation recommended.     Marymargaret Kirker  11/13/20146:57 AM

## 2013-06-26 NOTE — Progress Notes (Signed)
Report called to receiving RN Freida Busman [phone]. Patient currently on contact precautions. Will transfer via WC.

## 2013-06-26 NOTE — Progress Notes (Addendum)
Pt rec for unit. Pt o4x, no compliants of pain or sob at this time. Iv team paged aware of center line Rt IJ. Will continue to monitor

## 2013-06-27 LAB — BASIC METABOLIC PANEL
CO2: 29 mEq/L (ref 19–32)
Calcium: 8.7 mg/dL (ref 8.4–10.5)
Creatinine, Ser: 1.65 mg/dL — ABNORMAL HIGH (ref 0.50–1.35)
GFR calc non Af Amer: 39 mL/min — ABNORMAL LOW (ref >90)
Glucose, Bld: 102 mg/dL — ABNORMAL HIGH (ref 70–99)
Sodium: 134 mEq/L — ABNORMAL LOW (ref 135–145)

## 2013-06-27 LAB — MAGNESIUM: Magnesium: 2.3 mg/dL (ref 1.5–2.5)

## 2013-06-27 MED ORDER — ALUM & MAG HYDROXIDE-SIMETH 200-200-20 MG/5ML PO SUSP: 30.0000 mL | Freq: Four times a day (QID) | ORAL | Status: DC | PRN

## 2013-06-27 MED ORDER — BOOST / RESOURCE BREEZE PO LIQD
1.0000 | Freq: Two times a day (BID) | ORAL | Status: DC
  Administered 2013-06-27 – 2013-06-28 (×2): 1 via ORAL

## 2013-06-27 MED ORDER — SODIUM CHLORIDE 0.9 % IV BOLUS (SEPSIS)
250.0000 mL | Freq: Once | INTRAVENOUS | Status: AC
  Administered 2013-06-27: 06:00:00 250 mL via INTRAVENOUS

## 2013-06-27 MED ORDER — SODIUM CHLORIDE 0.9 % IJ SOLN
3.0000 mL | Freq: Two times a day (BID) | INTRAMUSCULAR | Status: DC
  Administered 2013-06-27 – 2013-06-28 (×2): 3 mL via INTRAVENOUS

## 2013-06-27 MED ORDER — SODIUM CHLORIDE 0.9 % IV SOLN
INTRAVENOUS | Status: AC
  Administered 2013-06-27: 08:00:00 via INTRAVENOUS

## 2013-06-27 MED ORDER — PANTOPRAZOLE SODIUM 40 MG PO TBEC
40.0000 mg | DELAYED_RELEASE_TABLET | Freq: Two times a day (BID) | ORAL | Status: DC
  Administered 2013-06-27 – 2013-06-28 (×2): 40 mg via ORAL
  Filled 2013-06-27 (×3): qty 1

## 2013-06-27 NOTE — Progress Notes (Signed)
PT. With low BP. On call NP, Benedetto Coons, made aware. New orders received. RN will implement as ordered and continue to monitor pt. For  Changes in condition. Ramses Klecka, Cheryll Dessert

## 2013-06-27 NOTE — Progress Notes (Signed)
CHF book given, will continue to educate.

## 2013-06-27 NOTE — Progress Notes (Signed)
K 6.2 md notified .

## 2013-06-27 NOTE — Progress Notes (Signed)
     SUBJECTIVE: No chest pain or SOB.   BP 110/43  Pulse 71  Temp(Src) 98.1 F (36.7 C) (Oral)  Resp 19  Ht 6' (1.829 m)  Wt 161 lb 8 oz (73.256 kg)  BMI 21.90 kg/m2  SpO2 92%  Intake/Output Summary (Last 24 hours) at 06/27/13 1610 Last data filed at 06/26/13 2213  Gross per 24 hour  Intake    930 ml  Output   1150 ml  Net   -220 ml    PHYSICAL EXAM General: Well developed, well nourished, in no acute distress. Alert and oriented x 3.  Psych:  Good affect, responds appropriately Neck: No JVD. No masses noted.  Lungs: Coarse BS bilaterally with no wheezes or rhonci noted.  Heart: RRR with no murmurs noted. Abdomen: Bowel sounds are present. Soft, non-tender.  Extremities: No lower extremity edema.   LABS: Basic Metabolic Panel:  Recent Labs  96/04/54 0355 06/27/13 0540  NA 135 134*  K 4.8 6.2*  CL 92* 97  CO2 36* 29  GLUCOSE 90 102*  BUN 21 29*  CREATININE 1.38* 1.65*  CALCIUM 8.8 8.7  MG 1.9 2.3   Current Meds: . amiodarone  400 mg Oral BID  . antiseptic oral rinse  15 mL Mouth Rinse BID  . aspirin  81 mg Oral Daily  . atorvastatin  40 mg Oral q1800  . carvedilol  3.125 mg Oral BID WC  . cilostazol  100 mg Oral BID  . clopidogrel  75 mg Oral Q breakfast  . enoxaparin (LOVENOX) injection  40 mg Subcutaneous Q24H  . ferrous sulfate  325 mg Oral TID WC  . furosemide  40 mg Oral Daily  . influenza vac split quadrivalent PF  0.5 mL Intramuscular Tomorrow-1000  . lisinopril  2.5 mg Oral Daily  . magnesium sulfate 1 - 4 g bolus IVPB  2 g Intravenous Daily  . potassium & sodium phosphates  2 packet Oral TID WC & HS  . potassium chloride  40 mEq Oral TID  . sertraline  100 mg Oral Daily  . sodium chloride  3 mL Intravenous Q12H     ASSESSMENT AND PLAN:  1. Wide complex tachycardia: No recurrence. This appears to be atrial fib with bundle branch making it appear wide. Now on amiodarone po. I have discussed anti-coagulation with pt and wife and they do  not wish to pursue anticoagulation after discharge. He refuses to consider coumadin or a newer agent.   2. CAD: Known to have severe CAD s/p CABG with no good options for PCI at last cath in May 2014. He is not having chest pain. At this point, will continue medical therapy with ASA, Plavix, Coreg. Lipitor on hold due to transaminitis from shock liver. LFTs have normalized. Can restart statin.   3. Chronic systolic heart failure, EF 35-40%: He seems to have been diuresed effectively. Renal function now worsening, possibly from over-diuresis. Will stop po Lasix. Will gently hydrate this am (50cc per hour for 8 hours). Ambulate with ambulatory O2 sats. He has chronic dyspnea due to COPD. Net positive 10 liters since admission but he came in profoundly dehydrated with diarrhea. Repeat BMET in am.   4. Tobacco abuse: Cessation recommended.   5. Hyperkalemia: D/C supplemental potassium. Repeat potassium this am. Follow up later in am by primary team.     Dvante Hands  11/14/20147:12 AM

## 2013-06-27 NOTE — Progress Notes (Addendum)
Occupational Therapy Treatment Patient Details Name: NATHON STEFANSKI MRN: [id number] DOB: 1935/03/08 Today's Date: 06/27/2013 Time: 1111-1140 OT Time Calculation (min): 29 min  OT Assessment / Plan / Recommendation  History of present illness AZIM GILLINGHAM is a 78 y.o. male was brought to the ER the patient was found to be very weak. As per the family patient has been experiencing diarrhea over the last one week but has not had any nausea vomiting. 2 days ago patient had periumbilical pain which at this time patient states has resolved. The patient developed V-tach arrest on 11/6 and was resuscitated (see notes) intubated and transferred to the ICU; extubated 11/7.  Had another episode V-tach 11/8.     OT comments  Today is pt's 57th wedding anniversary. Pt progressing toward POC/goals, sitting EOB for ADL retraining session today. Pt O2 sats 88-89% on 5 L O2 throughout session. Pt reports no SOB. Cont toward acute goals, will need HHOT, 24/assist at d/c.  Follow Up Recommendations  Home health OT;Supervision/Assistance - 24 hour    Barriers to Discharge       Equipment Recommendations  3 in 1 bedside comode    Recommendations for Other Services    Frequency Min 2X/week   Progress towards OT Goals Progress towards OT goals: Progressing toward goals  Plan Discharge plan remains appropriate    Precautions / Restrictions Precautions Precautions: Fall;Other (comment) Precaution Comments: monitor 02 sats Restrictions Weight Bearing Restrictions: No   Pertinent Vitals/Pain No, denies pain    ADL  Eating/Feeding: Performed;Modified independent Where Assessed - Eating/Feeding: Edge of bed Grooming: Performed;Wash/dry hands;Wash/dry face;Teeth care;Modified independent (Sitting EOB) Where Assessed - Grooming: Unsupported sitting Upper Body Bathing: Performed;Chest;Right arm;Left arm;Abdomen;Set up Where Assessed - Upper Body Bathing: Unsupported sitting (Sitting EOB) Lower Body Bathing:   (Pt declined "I'm clean, ain't been doing nothing") Upper Body Dressing: Performed;Set up;Modified independent (Sitting EOB) Where Assessed - Upper Body Dressing: Unsupported sitting Lower Body Dressing:  (Pt declined "These are clean" Pajamma bottoms) Toilet Transfer:  (Pt declined, used urinal sitting EOB) Toileting - Clothing Manipulation and Hygiene: Performed;Modified independent (Pt used urinal sitting EOB) Where Assessed - Toileting Clothing Manipulation and Hygiene: Other (comment) (Urinal sitting EOB) Tub/Shower Transfer Method: Not assessed Transfers/Ambulation Related to ADLs: Pt declined transfers today. Noted sitting EOB O2 88-89% during/after ADL's seated on 5L O2 throughout session. ADL Comments: Pt sitting EOB for duration of ADL's this am. O2 Sats @ 88-89% noted on 5L O2 throughout. Verbal review of breathing techniques. Pt reports he plans to d/c home this weekend w/ family assist. Discussed energy conservation, family very attentive.    OT Diagnosis:    OT Problem List:   OT Treatment Interventions:     OT Goals(current goals can now be found in the care plan section) Acute Rehab OT Goals Patient Stated Goal: I want ot go home this weekend or today. This is my 57th anniversary today (06/27/13)  Visit Information  Last OT Received On: 06/27/13 Assistance Needed: +1 History of Present Illness: RODRIGUS KILKER is a 78 y.o. male was brought to the ER the patient was found to be very weak. As per the family patient has been experiencing diarrhea over the last one week but has not had any nausea vomiting. 2 days ago patient had periumbilical pain which at this time patient states has resolved. The patient developed V-tach arrest on 11/6 and was resuscitated (see notes) intubated and transferred to the ICU; extubated 11/7.  Had another episode V-tach  11/8.     Subjective Data      Prior Functioning       Cognition  Cognition Arousal/Alertness: Awake/alert Behavior During  Therapy: WFL for tasks assessed/performed Overall Cognitive Status: Within Functional Limits for tasks assessed    Mobility  Bed Mobility Bed Mobility: Supine to Sit;Sitting - Scoot to [place] of Bed;Sit to Supine;Scooting to [facility] Supine to Sit: 6: Modified independent (Device/Increase time);HOB elevated;With rails Sitting - Scoot to Edge of Bed: 6: Modified independent (Device/Increase time);With rail Sit to Supine: 6: Modified independent (Device/Increase time);HOB elevated Scooting to [facility]: 6: Modified independent (Device/Increase time);With rail Transfers Transfers: Not assessed         Balance Balance Balance Assessed: Yes Static Sitting Balance Static Sitting - Balance Support: No upper extremity supported;Feet supported;Feet unsupported Static Sitting - Level of Assistance: 6: Modified independent (Device/Increase time) Static Sitting - Comment/# of Minutes: ~25 min   End of Session OT - End of Session Activity Tolerance: Patient tolerated treatment well Patient left: in bed;with call bell/phone within reach Nurse Communication: Other (comment) (ADL's, O2 sats, urinated 8 oz)  GO     Barnhill, Amy Beth Dixon 06/27/2013, 11:52 AM

## 2013-06-27 NOTE — Progress Notes (Signed)
[facility] PROGRESS NOTE Interim History: 78 y.o. male who was brought to the ER after the patient was found to be very weak. As per the family patient had been experiencing diarrhea over the last one week but had not had any nausea or vomiting. Two days prior to admission the patient had periumbilical pain which has subsequently resolved. He had not been eating well for 2 days. Apparently other family members had similar GI illnesses or an. In the ER patient was found initially to be hypotensive and was given 2 L normal saline bolus followed by another 2 L. After the fluid boluses patient's blood pressure improved. Lactic acid levels have been normal. Patient's creatinine was markedly increased from baseline with an associated metabolic acidosis. Recently the patient had been having increasing hallucinations and was started on Risperdal by his primary care physician    Assessment/Plan: V tach vs aberrantly conducted A-fib  - in setting of electrolyte abnormalities and an underlying cardiomyopathy - EF 30%  - Amio infusion was increased after another bolus of Amio due to another episode of V-tach (on 11/8 AM) when pt was assisted to the bedside commode-transitioned to PO Amiodarone 11/11  - Cards f relates is AF in setting of RBBB leading to appearance of VT  - Cardiology following  - pt/and family refuse anti coagulation with either coumadin or NACA.  Acute Hypoxic Respiratory Failure/Systolic CHF- EF 16%/XWRU/EAV  -requiring VM oxygen with mobilization but o/w has weaned to 4L at rest  -likely due to volume overload in setting recent volume replacement and depressed systolic function  - management per Cardiology  - given Lasix 11/9 but still 15 L positive so added Lasix 60 mg IV BID on 11/10 and increased Kdur to 60 meq- on 11/11 still overloaded and becoming quite hypoxic on movement therefore, Lasix increased to TID with improved diuresis so will continue same dose  -resume very low  dose ACE I and cont Coreg  -review of prior CXR show ILD pattern- pt with ongoing tobacco and prior work exposure to asbestos   Norovirus induced enteritis  - suspect also due to oral Mg which was started recently so was dc'd  - GI pathogen panel positive for Norovirus  -cont supportive care   Acute renal failure/Metabolic acidosis  - Resolved with hydration but then developed sx's c/w pulmonary edema so IVFs were dc'd and she was given a dose of Lasix  -baseline renal function 8 / 0.85  -etiology likely pre renal with ATN from hypotension so ACE I held- given current edema and new systolic dysfunction was resumed 11/12 with subtle increase in Scr- follow close. - cr trending up, cont gentle diuresis liberalize diet. - d/c lisinopril 11.14.2014  Hypokalemia/Hypophos/ Hypomag  - cont to check daily and replace prn.  HTN  -BP soft  - on Coreg-resumed ACE I on 11/12   CORONARY ARTERY DISEASE  -per Cardiology   Transaminitis  -due to low perfusion/hypotension (shock liver)  -resolved so can resume Lipitor at dc    Code Status: Full  Family Communication: Patient  Disposition Plan/Expected LOS: Stepdown    Consultants:  cards  Procedures:  Echo EF 35 %  Antibiotics:    HPI/Subjective: Feels much better.  Objective: Filed Vitals:   06/27/13 0449 06/27/13 0623 06/27/13 0853 06/27/13 1035  BP: 77/39 110/43 126/58 121/57  Pulse: 69 71 86 92  Temp:      TempSrc:      Resp:   20 19  Height:  Weight:      SpO2:   89% 93%    Intake/Output Summary (Last 24 hours) at 06/27/13 1311 Last data filed at 06/27/13 1000  Gross per 24 hour  Intake    560 ml  Output    750 ml  Net   -190 ml   Filed Weights   06/24/13 0300 06/26/13 1500 06/27/13 0441  Weight: 81.6 kg (179 lb 14.3 oz) 73 kg (160 lb 15 oz) 73.256 kg (161 lb 8 oz)    Exam:  General: Alert, awake, oriented x3, in no acute distress.  HEENT: No bruits, no goiter.  Heart: Regular rate and rhythm,  without murmurs, rubs, gallops.  Lungs: Good air movement, clear to auscultation. Abdomen: Soft, nontender, nondistended, positive bowel sounds.     Data Reviewed: Basic Metabolic Panel:  Recent Labs Lab 06/21/13 0406 06/22/13 0500 06/22/13 1754 06/23/13 0500 06/24/13 0500 06/25/13 0531 06/26/13 0355 06/27/13 0540 06/27/13 0905  NA 137 139  --  137 136 136 135 134*  --   K 4.2 3.3*  --  3.9 4.3 4.7 4.8 6.2* 5.8*  CL 100 102  --  98 100 95* 92* 97  --   CO2 26 30  --  33* 31 34* 36* 29  --   GLUCOSE 136* 98  --  94 87 98 90 102*  --   BUN 10 8  --  9 10 14 21  29*  --   CREATININE 0.89 0.79  --  0.86 0.97 1.19 1.38* 1.65*  --   CALCIUM 8.3* 7.8*  --  8.1* 7.8* 8.5 8.8 8.7  --   MG 1.7 1.5  --  1.5 1.6 1.7 1.9 2.3  --   PHOS 3.0 2.8 3.1 3.0 2.6  --   --   --   --    Liver Function Tests:  Recent Labs Lab 06/24/13 0500  AST 24  ALT 16  ALKPHOS 132*  BILITOT 0.3  PROT 4.6*  ALBUMIN 1.6*   No results found for this basename: LIPASE, AMYLASE,  in the last 168 hours No results found for this basename: AMMONIA,  in the last 168 hours CBC:  Recent Labs Lab 06/21/13 0401 06/21/13 0406 06/22/13 0500  WBC  --  14.4* 10.9*  HGB 10.9* 10.8* 9.8*  HCT 32.0* 32.0* 29.2*  MCV  --  90.7 91.5  PLT  --  180 159   Cardiac Enzymes:  Recent Labs Lab 06/20/13 1815 06/20/13 2327  TROPONINI 3.53* 3.00*   BNP (last 3 results)  Recent Labs  12/23/12 2045  PROBNP 1188.0*   CBG:  Recent Labs Lab 06/26/13 1629  GLUCAP 125*    Recent Results (from the past 240 hour(s))  CULTURE, EXPECTORATED SPUTUM-ASSESSMENT     Status: None   Collection Time    06/21/13  1:00 PM      Result Value Range Status   Specimen Description SPUTUM   Final   Special Requests Normal   Final   Sputum evaluation     Final   Value: THIS SPECIMEN IS ACCEPTABLE. RESPIRATORY CULTURE REPORT TO FOLLOW.   Report Status 06/21/2013 FINAL   Final  CULTURE, RESPIRATORY (NON-EXPECTORATED)      Status: None   Collection Time    06/21/13  1:00 PM      Result Value Range Status   Specimen Description SPUTUM   Final   Special Requests NONE   Final   Gram Stain     Final  Value: NO WBC SEEN     NO SQUAMOUS EPITHELIAL CELLS SEEN     FEW GRAM POSITIVE COCCI     IN PAIRS     Performed at [facility]   Culture     Final   Value: NORMAL OROPHARYNGEAL FLORA     Performed at [facility]   Report Status 06/24/2013 FINAL   Final     Studies: No results found.  Scheduled Meds: . amiodarone  400 mg Oral BID  . antiseptic oral rinse  15 mL Mouth Rinse BID  . aspirin  81 mg Oral Daily  . atorvastatin  40 mg Oral q1800  . carvedilol  3.125 mg Oral BID WC  . cilostazol  100 mg Oral BID  . clopidogrel  75 mg Oral Q breakfast  . enoxaparin (LOVENOX) injection  40 mg Subcutaneous Q24H  . ferrous sulfate  325 mg Oral TID WC  . lisinopril  2.5 mg Oral Daily  . magnesium sulfate 1 - 4 g bolus IVPB  2 g Intravenous Daily  . pantoprazole  40 mg Oral BID  . sertraline  100 mg Oral Daily   Continuous Infusions: . sodium chloride Stopped (06/27/13 0902)  . sodium chloride 50 mL/hr at 06/27/13 1914     Marinda Elk  [facility] Pager [phone]. If 8PM-8AM, please contact night-coverage at [email or web address], password [facility] 06/27/2013, 1:11 PM  LOS: 11 days

## 2013-06-27 NOTE — Progress Notes (Signed)
SATURATION QUALIFICATIONS: (This note is used to comply with regulatory documentation for home oxygen)  Patient Saturations on Room Air at Rest = 90-91%  Patient Saturations on Room Air while Ambulating = unable to check at this time  Patient Saturations on  2Liters of oxygen while Ambulating = 91-93%  ---Pt was on  5l at rest, 97%, walked 156ft on 4L stat 95%  21ft on 3L stat 94-95%, 328ft on  2L 91-93%. Walked total of 435ft. Pt no sign of distress. Pt stated he felt tried, walked back before being able to check on RA. Will ask night RN to ambulate on RA and document.

## 2013-06-27 NOTE — Progress Notes (Signed)
Pt O4x, lying in bed, granddaughter at bedside. No complaints of pain or sob. Pt on 5l O2 stat 89%, humidity added to O2. Will continue to monitor

## 2013-06-27 NOTE — Progress Notes (Signed)
Pt states he feels nausea after he eats if he lays down. Pt denies nausea that this time. Will continue to monitor

## 2013-06-27 NOTE — Progress Notes (Signed)
Physical Therapy Treatment Patient Details Name: [name] MRN: [id number] DOB: 1935-03-29 Today's Date: 06/27/2013 Time: 4098-1191 PT Time Calculation (min): 15 min  PT Assessment / Plan / Recommendation  History of Present Illness [name] is a 78 y.o. male was brought to the ER the patient was found to be very weak. As per the family patient has been experiencing diarrhea over the last one week but has not had any nausea vomiting. 2 days ago patient had periumbilical pain which at this time patient states has resolved. The patient developed V-tach arrest on 11/6 and was resuscitated (see notes) intubated and transferred to the ICU; extubated 11/7.  Had another episode V-tach 11/8.     PT Comments   Patient with K+ elevated today.  Patient fatigued quickly - decreased distance.  Follow Up Recommendations  Home health PT;Supervision/Assistance - 24 hour     Does the patient have the potential to tolerate intense rehabilitation     Barriers to Discharge        Equipment Recommendations  None recommended by PT    Recommendations for Other Services    Frequency Min 3X/week   Progress towards PT Goals Progress towards PT goals: Progressing toward goals  Plan Current plan remains appropriate    Precautions / Restrictions Precautions Precautions: Fall;Other (comment) Precaution Comments: monitor 02 sats Restrictions Weight Bearing Restrictions: No   Pertinent Vitals/Pain     Mobility  Bed Mobility Bed Mobility: Supine to Sit;Sitting - Scoot to Edge of Bed;Sit to Supine Supine to Sit: 6: Modified independent (Device/Increase time);HOB elevated;With rails Sitting - Scoot to Edge of Bed: 6: Modified independent (Device/Increase time);With rail Sit to Supine: 6: Modified independent (Device/Increase time);HOB elevated;With rail Scooting to [facility]: 6: Modified independent (Device/Increase time);With rail Transfers Transfers: Sit to Stand;Stand to Sit Sit to Stand: 4: Min  assist;With upper extremity assist;From bed Stand to Sit: 4: Min assist;With upper extremity assist;To bed Details for Transfer Assistance: Assist to return to sitting safely due to fatigue Ambulation/Gait Ambulation/Gait Assistance: 4: Min guard Ambulation Distance (Feet): 48 Feet Assistive device: Rolling walker Ambulation/Gait Assistance Details: Verbal cues for upright posture.  Patient fatigues quickly. Gait Pattern: Step-through pattern;Decreased stride length;Trunk flexed Gait velocity: decreased      PT Goals (current goals can now be found in the care plan section) Acute Rehab PT Goals Patient Stated Goal: I want ot go home this weekend or today. This is my 55th anniversary today (06/27/13)  Visit Information  Last PT Received On: 06/27/13 Assistance Needed: +1 History of Present Illness: [name] is a 77 y.o. male was brought to the ER the patient was found to be very weak. As per the family patient has been experiencing diarrhea over the last one week but has not had any nausea vomiting. 2 days ago patient had periumbilical pain which at this time patient states has resolved. The patient developed V-tach arrest on 11/6 and was resuscitated (see notes) intubated and transferred to the ICU; extubated 11/7.  Had another episode V-tach 11/8.      Subjective Data  Subjective: "I think that is it for today" Patient Stated Goal: I want ot go home this weekend or today. This is my 55th anniversary today (06/27/13)   Cognition  Cognition Arousal/Alertness: Awake/alert Behavior During Therapy: WFL for tasks assessed/performed Overall Cognitive Status: Within Functional Limits for tasks assessed    Balance  Balance Balance Assessed: Yes Static Sitting Balance Static Sitting - Balance Support: No upper extremity  supported;Feet supported;Feet unsupported Static Sitting - Level of Assistance: 6: Modified independent (Device/Increase time) Static Sitting - Comment/# of Minutes:  ~25 min  End of Session PT - End of Session Equipment Utilized During Treatment: Gait belt;Oxygen Activity Tolerance: Patient limited by fatigue Patient left: in bed;with call bell/phone within reach;with family/visitor present Nurse Communication: Mobility status   GP     Vena Austria 06/27/2013, 1:00 PM Durenda Hurt. Renaldo Fiddler, [facility] Acute Rehab Services Pager [phone]

## 2013-06-27 NOTE — Progress Notes (Signed)
INITIAL NUTRITION ASSESSMENT  DOCUMENTATION CODES Per approved criteria  -Severe malnutrition in the context of chronic illness   INTERVENTION: Resource Breeze po BID, each supplement provides 250 kcal and 9 grams of protein. RD to continue to follow nutrition care plan.  NUTRITION DIAGNOSIS: Inadequate oral intake related to GI distress as evidenced by poor meal completion.   Goal: Intake to meet >90% of estimated nutrition needs.  Monitor:  weight trends, lab trends, I/O's, PO intake, supplement tolerance  Reason for Assessment: Malnutrition Screening Tool  78 y.o. male  Admitting Dx: gastroenteritis  ASSESSMENT: PMHx significant for CVA, depression, COPD. Admitted with weakness and diarrhea x 1 week. Work-up reveals gastroenteritis and ARF with severe dehydration.  Pt confirms ongoing poor oral intake. Pt with several snacks in room that he states he is not really eating. Feels okay when he lays down but has stomach problems 2/2 "his hernia" when he sits up. Cannot tolerate cheese, and sometimes cannot tolerate milk. Suspect he will not like Ensure. Agreeable to [employer].   Potassium is currently elevated at 5.8. Mag and phos WNL (did require repletion earlier in hospitalization.)  Nutrition Focused Physical Exam:  Subcutaneous Fat:  Orbital Region: moderate depletion Upper Arm Region: severe depletion Thoracic and Lumbar Region: n/a  Muscle:  Temple Region: moderate depletion Clavicle Bone Region: severe depletion Clavicle and Acromion Bone Region: severe depletion Scapular Bone Region: moderate depletion Dorsal Hand: n/a Patellar Region: n/a Anterior Thigh Region: n/a Posterior Calf Region: n/a  Edema: none  Pt meets criteria for severe MALNUTRITION in the context of chronic illness as evidenced by intake of <75% x at least 1 month & severe fat and muscle mass loss.  Height: Ht Readings from Last 1 Encounters:  06/26/13 6' (1.829 m)    Weight: Wt  Readings from Last 1 Encounters:  06/27/13 161 lb 8 oz (73.256 kg)    Ideal Body Weight: 178 lb  % Ideal Body Weight: 90%  Wt Readings from Last 10 Encounters:  06/27/13 161 lb 8 oz (73.256 kg)  01/08/13 173 lb 12.8 oz (78.835 kg)  01/03/13 170 lb 8 oz (77.338 kg)  12/24/12 170 lb 8 oz (77.338 kg)  12/20/12 170 lb 8 oz (77.338 kg)  12/16/12 170 lb 9.6 oz (77.384 kg)  12/16/12 170 lb 9.6 oz (77.384 kg)  05/29/12 170 lb 9.6 oz (77.384 kg)  05/14/12 171 lb 12 oz (77.905 kg)  11/08/11 185 lb (83.915 kg)    Usual Body Weight: 170 lb  % Usual Body Weight: 95%  BMI:  Body mass index is 21.9 kg/(m^2). WNL  Estimated Nutritional Needs: Kcal: 1700 - 2000 Protein: 65 - 75 g Fluid: 1.7 - 2 liters  Skin: healing pressure ulcer on sacrum  Diet Order: Cardiac  EDUCATION NEEDS: -No education needs identified at this time   Intake/Output Summary (Last 24 hours) at 06/27/13 1507 Last data filed at 06/27/13 1000  Gross per 24 hour  Intake    560 ml  Output    750 ml  Net   -190 ml    Last BM: 11/13  Labs:   Recent Labs Lab 06/22/13 1754 06/23/13 0500 06/24/13 0500 06/25/13 0531 06/26/13 0355 06/27/13 0540 06/27/13 0905  NA  --  137 136 136 135 134*  --   K  --  3.9 4.3 4.7 4.8 6.2* 5.8*  CL  --  98 100 95* 92* 97  --   CO2  --  33* 31 34* 36* 29  --  BUN  --  9 10 14 21  29*  --   CREATININE  --  0.86 0.97 1.19 1.38* 1.65*  --   CALCIUM  --  8.1* 7.8* 8.5 8.8 8.7  --   MG  --  1.5 1.6 1.7 1.9 2.3  --   PHOS 3.1 3.0 2.6  --   --   --   --   GLUCOSE  --  94 87 98 90 102*  --     CBG (last 3)   Recent Labs  06/26/13 1629  GLUCAP 125*    Scheduled Meds: . amiodarone  400 mg Oral BID  . antiseptic oral rinse  15 mL Mouth Rinse BID  . aspirin  81 mg Oral Daily  . atorvastatin  40 mg Oral q1800  . carvedilol  3.125 mg Oral BID WC  . cilostazol  100 mg Oral BID  . clopidogrel  75 mg Oral Q breakfast  . enoxaparin (LOVENOX) injection  40 mg Subcutaneous  Q24H  . ferrous sulfate  325 mg Oral TID WC  . magnesium sulfate 1 - 4 g bolus IVPB  2 g Intravenous Daily  . pantoprazole  40 mg Oral BID WC  . sertraline  100 mg Oral Daily    Continuous Infusions: . sodium chloride Stopped (06/27/13 0902)  . sodium chloride 50 mL/hr at 06/27/13 2841    Past Medical History  Diagnosis Date  . CEREBROVASCULAR ACCIDENT, HX OF 03/02/2007  . CHEST PAIN 01/31/2010  . CHRONIC OBSTRUCTIVE PULMONARY DISEASE, ACUTE EXACERBATION 02/18/2009  . COPD 03/02/2007  . CORONARY ARTERY DISEASE 03/02/2007  . DEPRESSION 09/23/2007    anxiety  . DISEASE, PERIPHERAL VASCULAR NEC 03/02/2007  . DIVERTICULOSIS, COLON 09/23/2007  . FATIGUE 01/22/2009  . GERD 03/02/2007  . GLUCOSE INTOLERANCE 09/23/2007  . HEMORRHOIDS 10/09/2007  . HIATAL HERNIA 10/09/2007  . HYPERCHOLESTEROLEMIA 10/09/2007  . HYPERTENSION 09/23/2007  . HYPOKALEMIA 10/09/2007  . MESENTERIC VASCULAR INSUFFICIENCY 07/06/2008  . MITRAL REGURGITATION 11/27/2008  . MI 10/09/2007  . Other specified forms of hearing loss 08/23/2010  . PERIPHERAL VASCULAR DISEASE 09/23/2007  . PUD 10/09/2007  . RENAL ARTERY STENOSIS 03/02/2007  . TOBACCO ABUSE 04/20/2010  . 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 Unspecified Iron Deficiency Anemia 09/23/2007  . URINARY RETENTION 01/22/2009  . VITAMIN B12 DEFICIENCY 01/03/2010  . WRIST PAIN, RIGHT 06/06/2010  . Impaired glucose tolerance 01/29/2011  . Hearing loss in left ear 01/30/2011  . Blindness of both eyes 01/30/2011  . Hyperlipidemia   . Anemia, iron deficiency   . Atrial flutter   . HTN (hypertension) 03/21/2011  . Peripheral arterial disease     Past Surgical History  Procedure Laterality Date  . Coronary artery bypass graft  1995    LIMA-LAD, SVG-OM, SVG-D, SVG-PDA  . Renal artery stenting  2004    bilateral  . S/p multiple le vascular bypass      includine fem-pop x 2 LLE  . S/p bilateral cea    . Coronary angioplasty with stent placement  2010    DES x 2 SVG-OM, cutting  balloon PTCA SVG-PDA for ISR  . Appendectomy    . S/p lumbar disc surgury      x2  . S/p rle fem bypass feb 2011    . Cardiac catheterization    . Pr vein bypass graft,aorto-fem-pop    . Carotid endarterectomy    . Coronary angioplasty with stent placement  2011    DES to SVG-DIAG, SVG-PDA  .  Esophagogastroduodenoscopy    . Colonoscopy      Jarold Motto MS, RD, LDN Pager: [phone] After-hours pager: [phone]

## 2013-06-27 NOTE — Progress Notes (Signed)
06/27/13 1115 Cory Kitt Lucretia Roers, RN, BSN, [employer] [phone] Spoke with Ayesha Rumpf, RN of [facility].  Confirmed that pt can recieve services of PT upon discharge home.  Dr. Robb Matar entered face to face [facility] orders.

## 2013-06-27 NOTE — Progress Notes (Signed)
Pt in bed o4x, family at bedside. No complaints of Pain or SOB. Per NT pt stat was 85% on RA at 1900 check, Put Pt back on 2L 94%. Will continue to monitor. Pt will need to be walked on RA 06-28-13 AM.

## 2013-06-27 NOTE — Progress Notes (Signed)
Pt. Alert and oriented this am. No s/s of distress noted. Pt. With critical K+ of 6.2 this am. MD made aware. New orders received. On coming RN made aware. RN will continue to monitor pt. For changes in condition. Marjo Grosvenor, Cheryll Dessert

## 2013-06-28 DIAGNOSIS — I4510 Unspecified right bundle-branch block: Secondary | ICD-10-CM | POA: Diagnosis present

## 2013-06-28 LAB — BASIC METABOLIC PANEL
BUN: 23 mg/dL (ref 6–23)
CO2: 26 mEq/L (ref 19–32)
Chloride: 99 mEq/L (ref 96–112)
Creatinine, Ser: 1.49 mg/dL — ABNORMAL HIGH (ref 0.50–1.35)
Potassium: 5.1 mEq/L (ref 3.5–5.1)

## 2013-06-28 MED ORDER — CARVEDILOL 3.125 MG PO TABS: 3.1250 mg | ORAL_TABLET | Freq: Two times a day (BID) | ORAL | Status: DC

## 2013-06-28 MED ORDER — AMIODARONE HCL 400 MG PO TABS: 200.0000 mg | ORAL_TABLET | Freq: Two times a day (BID) | ORAL | Status: DC

## 2013-06-28 MED ORDER — FUROSEMIDE 20 MG PO TABS: 20.0000 mg | ORAL_TABLET | Freq: Every day | ORAL | Status: DC

## 2013-06-28 MED ORDER — FUROSEMIDE 20 MG PO TABS: 40.0000 mg | ORAL_TABLET | Freq: Every day | ORAL | Status: DC

## 2013-06-28 MED ORDER — AMIODARONE HCL 400 MG PO TABS: 200.0000 mg | ORAL_TABLET | Freq: Every day | ORAL | Status: DC

## 2013-06-28 NOTE — Progress Notes (Signed)
Client and wife verbalized understanding of discharge instructions.  No n/v or diarrhea noted up discharge.  Family instructed to continue good hand hygiene.  RX given.

## 2013-06-28 NOTE — Progress Notes (Signed)
Subjective:  No cardiac complaints today.  Anxious to go home.  Objective:  Vital Signs in the last 24 hours: Temp:  [97.6 F (36.4 C)-98 F (36.7 C)] 97.9 F (36.6 C) (11/15 1100) Pulse Rate:  [76-97] 97 (11/15 1100) Resp:  [18-20] 18 (11/15 0554) BP: (105-129)/(54-69) 112/55 mmHg (11/15 1100) SpO2:  [92 %-97 %] 92 % (11/15 1100) Weight:  [159 lb 9.8 oz (72.4 kg)] 159 lb 9.8 oz (72.4 kg) (11/15 0554)  Intake/Output from previous day: 11/14 0701 - 11/15 0700 In: 1630.8 [P.O.:1220; I.V.:410.8] Out: 1300 [Urine:1300] Intake/Output from this shift: Total I/O In: 240 [P.O.:240] Out: 200 [Urine:200]  . amiodarone  400 mg Oral BID  . antiseptic oral rinse  15 mL Mouth Rinse BID  . aspirin  81 mg Oral Daily  . atorvastatin  40 mg Oral q1800  . carvedilol  3.125 mg Oral BID WC  . cilostazol  100 mg Oral BID  . clopidogrel  75 mg Oral Q breakfast  . enoxaparin (LOVENOX) injection  40 mg Subcutaneous Q24H  . feeding supplement (RESOURCE BREEZE)  1 Container Oral BID BM  . ferrous sulfate  325 mg Oral TID WC  . magnesium sulfate 1 - 4 g bolus IVPB  2 g Intravenous Daily  . pantoprazole  40 mg Oral BID WC  . sertraline  100 mg Oral Daily  . sodium chloride  3 mL Intravenous Q12H   . sodium chloride Stopped (06/27/13 0902)    Physical Exam: The patient appears to be in no distress.  Head and neck exam reveals that the pupils are equal and reactive.  The extraocular movements are full.  There is no scleral icterus.  Mouth and pharynx are benign.  No lymphadenopathy.  No carotid bruits.  The jugular venous pressure is normal.  Thyroid is not enlarged or tender.  Chest is clear to percussion and auscultation.  No rales or rhonchi.  Expansion of the chest is symmetrical.  Heart reveals no abnormal lift or heave.  First and second heart sounds are normal.  There is no murmur gallop rub or click.  The abdomen is soft and nontender.  Bowel sounds are normoactive.  There is no  hepatosplenomegaly or mass.  There are no abdominal bruits.  Extremities reveal no phlebitis or edema.  Pedal pulses are good.  There is no cyanosis or clubbing.  Neurologic exam is normal strength and no lateralizing weakness.  No sensory deficits.  Integument reveals no rash  Lab Results: No results found for this basename: WBC, HGB, PLT,  in the last 72 hours  Recent Labs  06/27/13 0540 06/27/13 0905 06/28/13 0610  NA 134*  --  133*  K 6.2* 5.8* 5.1  CL 97  --  99  CO2 29  --  26  GLUCOSE 102*  --  101*  BUN 29*  --  23  CREATININE 1.65*  --  1.49*   No results found for this basename: TROPONINI, CK, MB,  in the last 72 hours Hepatic Function Panel No results found for this basename: PROT, ALBUMIN, AST, ALT, ALKPHOS, BILITOT, BILIDIR, IBILI,  in the last 72 hours No results found for this basename: CHOL,  in the last 72 hours No results found for this basename: PROTIME,  in the last 72 hours  Imaging: No results found.  Cardiac Studies:  Assessment/Plan:  1. Wide complex tachycardia: No recurrence. This appears to be atrial fib with bundle branch making it appear wide. Now  on amiodarone po. I have discussed anti-coagulation with pt and wife and they do not wish to pursue anticoagulation after discharge. He refuses to consider coumadin or a newer agent.  2. CAD: Known to have severe CAD s/p CABG with no good options for PCI at last cath in May 2014. He is not having chest pain. At this point, will continue medical therapy with ASA, Plavix, Coreg. Lipitor on hold due to transaminitis from shock liver. LFTs have normalized. Now back on lipitor.  3. Chronic systolic heart failure, EF 35-40%: He seems to have been diuresed effectively. Renal function improving. 4. Tobacco abuse: Cessation recommended.  5. Hyperkalemia: improving.  Plan: Home soon. Close followup with PCP Dr. [name]. At discharge would decrease amiodarone to 200 mg BID. Followup with cardiology  for OV and  EKG in several weeks.   LOS: 12 days    Cassell Clement 06/28/2013, 12:43 PM

## 2013-06-28 NOTE — Discharge Summary (Addendum)
Physician Discharge Summary  [name] ZOX:[id number] DOB: 1935/01/09 DOA: 06/16/2013  PCP: [name], MD  Admit date: 06/16/2013 Discharge date: 06/28/2013  Time spent: 40 minutes  Recommendations for Outpatient Follow-up:  1. Follow up with cardiology in 1 week. Follow up on hypertension, b-met and titrate meds as tolerate it.  Discharge Diagnoses:  Principal Problem:   Acute respiratory failure with hypoxia Active Problems:   Acute renal failure   Atrial fibrillation with RVR   Acute on chronic systolic and diastolic heart failure, NYHA class 3   Unspecified Iron Deficiency Anemia   CORONARY ARTERY DISEASE   RENAL ARTERY STENOSIS   COPD   HTN (hypertension)   Metabolic acidosis   Hypotension   Cardiac arrest   Enteritis due to Norovirus   Ventricular tachycardia, paroxysmal   Systolic CHF   Right bundle branch block   Discharge Condition: guarded  Diet recommendation: heart healthy diet  Filed Weights   06/26/13 1500 06/27/13 0441 06/28/13 0554  Weight: 73 kg (160 lb 15 oz) 73.256 kg (161 lb 8 oz) 72.4 kg (159 lb 9.8 oz)    History of present illness:  78 y.o. male was brought to the ER the patient was found to be very weak. As per the family patient has been experiencing diarrhea over the last one week but has not had any nausea vomiting. 2 days ago patient had periumbilical pain which at this time patient states has resolved. Patient also has not been eating well last 2 days. Patient states that otherwise he did not have any chest pain earlier but in the ER will examine patient had some mild chest discomfort which is off spontaneously. As per the patient has pain diarrhea running in his family over the last 2 weeks. In the ER patient was found initially to be hypotensive and was given 2 L normal saline bolus initially followed by another 2 L. After the fluid boluses patient's blood pressure improved. Lactic acid levels have been normal. Patient's creatinine has been  markedly increased from baseline. Patient is also found to be having metabolic acidosis. Patient had it is not have any productive cough fever chills. Of recently patient has been having increasing hallucinations and was started on Risperdal by patient's primary care physician   Hospital Course:  Aberrantly conducted A-fib  - In setting of electrolyte abnormalities and an underlying cardiomyopathy - EF 30%  - Amio infusion started and  increased after another bolus of Amio due to another episode of V-tach (on 11/8 AM) -transitioned to PO Amiodarone 11/11  - Cards  relates is AF in setting of RBBB leading to appearance of VT  - Cardiology following, will need a close follow up appointment. - pt/and family refuse anti coagulation with either coumadin or NACA.   Acute Hypoxic Respiratory Failure/Systolic CHF- EF 81%/XBJY/NWG  -requiring VM oxygen with mobilization but o/w has weaned to 4L at rest  -likely due to volume overload in setting recent volume replacement and depressed systolic function  - management per Cardiology  - Lasix increased to TID with improved diuresis, but Cr trended up it was decrease to once a day. - Resume very low dose ACE I and cont Coreg    Norovirus induced enteritis  - suspect also due to oral Mg which was started recently so was dc'd  - GI pathogen panel positive for Norovirus  - Treated with supportive care.   Acute renal failure/Metabolic acidosis  - Resolved with hydration but then developed sx's c/w  pulmonary edema so IVFs were dc'd and she was given a dose of Lasix  - Baseline renal function 8 / 0.85  - Etiology likely pre renal with ATN from hypotension so ACE I held- given current edema and new systolic dysfunction was resumed 11/12 with subtle increase in Scr- follow close.  - cr trending up, liberalize diet and Cr started trending down. - b-me tin 1 week.  Hypokalemia/Hypophos/ Hypomag  - Check and repleted daily.  HTN  - On Coreg-resumed ACE I  on 11/12  - Lasix once a day, follow up with cardiology Transaminitis  -due to low perfusion/hypotension (shock liver)  -resolved so can resume Lipitor at dc   Procedures:  CXR   Consultations:  Cardiology  Discharge Exam: Filed Vitals:   06/28/13 1411  BP: 133/63  Pulse: 91  Temp: 97.4 F (36.3 C)  Resp: 16    General: A&Ox3 Cardiovascular: RRR Respiratory: good air movement CTA B/L  Discharge Instructions      Discharge Orders   Future Orders Complete By Expires   Diet - low sodium heart healthy  As directed    Increase activity slowly  As directed        Medication List    STOP taking these medications       metoprolol 100 MG tablet  Commonly known as:  LOPRESSOR     traMADol 50 MG tablet  Commonly known as:  ULTRAM      TAKE these medications       amiodarone 400 MG tablet  Commonly known as:  PACERONE  Take 0.5 tablets (200 mg total) by mouth daily.     amLODipine 10 MG tablet  Commonly known as:  NORVASC  Take 10 mg by mouth daily.     aspirin 81 MG EC tablet  Take 81 mg by mouth daily.     atorvastatin 40 MG tablet  Commonly known as:  LIPITOR  Take 40 mg by mouth daily.     carvedilol 3.125 MG tablet  Commonly known as:  COREG  Take 1 tablet (3.125 mg total) by mouth 2 (two) times daily with a meal.     cilostazol 100 MG tablet  Commonly known as:  PLETAL  Take 100 mg by mouth 2 (two) times daily.     clopidogrel 75 MG tablet  Commonly known as:  PLAVIX  Take 75 mg by mouth daily.     esomeprazole 40 MG capsule  Commonly known as:  NEXIUM  Take 1 capsule (40 mg total) by mouth daily.     ferrous sulfate 325 (65 FE) MG tablet  Take 325 mg by mouth 3 (three) times daily.     fexofenadine 180 MG tablet  Commonly known as:  ALLEGRA  Take 180 mg by mouth daily.     furosemide 20 MG tablet  Commonly known as:  LASIX  Take 2 tablets (40 mg total) by mouth daily.     isosorbide mononitrate 60 MG 24 hr tablet  Commonly  known as:  IMDUR  Take 60 mg by mouth daily.     lisinopril 10 MG tablet  Commonly known as:  PRINIVIL,ZESTRIL  Take 10 mg by mouth daily.     nitroGLYCERIN 0.4 MG SL tablet  Commonly known as:  NITROSTAT  Place 1 tablet (0.4 mg total) under the tongue every 5 (five) minutes as needed for chest pain.     ranitidine 150 MG tablet  Commonly known as:  ZANTAC  Take 1  tablet (150 mg total) by mouth at bedtime.     risperiDONE 0.5 MG tablet  Commonly known as:  RISPERDAL  Take 1 tablet (0.5 mg total) by mouth 2 (two) times daily.     sertraline 100 MG tablet  Commonly known as:  ZOLOFT  Take 1 tablet (100 mg total) by mouth daily.       No Known Allergies Follow-up Information   Follow up with Elyn Aquas., MD In 1 week. (hospital follow up)    Specialty:  Cardiology   Contact information:   [address] [place] [address] [phone]        The results of significant diagnostics from this hospitalization (including imaging, microbiology, ancillary and laboratory) are listed below for reference.    Significant Diagnostic Studies: Ct Abdomen Pelvis Wo Contrast  06/17/2013   CLINICAL DATA:  Abdominal pain, weakness and confusion. Diarrhea.  EXAM: CT ABDOMEN AND PELVIS WITHOUT CONTRAST  TECHNIQUE: Multidetector CT imaging of the abdomen and pelvis was performed following the standard protocol without intravenous contrast.  COMPARISON:  CT of the abdomen and pelvis July 23, 2008.  FINDINGS: Limited view of the lung bases demonstrated dependent atelectasis, minimal pleural thickening. Dense mitral annular calcifications, status post median sternotomy with cardiac lead in place.  CONTRAST within the included thoracic esophagus and stomach. Multiple loops of prominent small bowel in the measure up to 3.7 cm, without discrete transition point. Fluid filled cecum, large bowel is normal in course and caliber. No pericolonic inflammatory changes. Sigmoid  diverticulosis  Liver, spleen, right adrenal gland are unremarkable. Relatively fatty replaced pancreas is otherwise unremarkable. Punctate layering gallstones within the gallbladder. 24 x 19 mm left adrenal nodule, 5 Hounsfield units, consistent with benign adenoma. No intraperitoneal free fluid nor free air.  Kidneys are well located. 3 mm right interpolar calculus was present previously, bilateral vascular calcifications. No new hydronephrosis. Limited assessment for renal masses on this noncontrast examination.  Severe calcific atherosclerosis aorta, with apparent bilateral renal artery stents. Bilateral common iliac artery stents. Urinary bladder is well distended unremarkable. Prostate is nonsuspicious. Patient is osteopenic. Severe L4-5 degenerative disc disease with minimal neural foraminal narrowing.  IMPRESSION: Multiple loops of mildly prominent small bowel throughout the abdomen, with no discrete transition point, this could reflect enteritis with fluid filled cecum. No bowel obstruction. Sigmoid diverticulosis without acute diverticulitis.  Cholelithiasis without CT findings of acute cholecystitis.  Severe calcific atherosclerosis of the aorta with bilateral renal and common iliac artery stents.   Electronically Signed   By: Awilda Metro   On: 06/17/2013 06:48   Dg Chest Port 1 View  06/25/2013   CLINICAL DATA:  CHF.  EXAM: PORTABLE CHEST - 1 VIEW  COMPARISON:  June 22, 2013.  FINDINGS: The lungs are well expanded. The interstitial markings remain diffusely increased. The left hemidiaphragm is slightly better demonstrated today. Persistent fibrotic type density is present in the lower 1/3 of the right lung. The cardiac silhouette remains top-normal in size. The pulmonary vascularity is mildly prominent centrally. The patient has undergone previous CABG. There is calcification in the wall of the tortuous descending thoracic aorta. The right internal jugular venous catheter tip lies in the  region of the distal portion of the internal jugular vein and appears to been withdrawn somewhat since the previous study.  IMPRESSION: 1. There has not been significant interval change in the appearance of the pulmonary interstitium or cardiac silhouette since the previous study. The left hemidiaphragm is  slightly better demonstrated today and hazy density in the right mid and lower lung is somewhat less conspicuous which may reflect some resolving atelectasis or interstitial edema. 2. The right internal jugular venous catheter appears to brisk been withdrawn somewhat such that its tip now arise in the region of the distal portion of the right internal jugular vein.   Electronically Signed   By: David  [place]   On: 06/25/2013 08:38   Dg Chest Port 1 View  06/22/2013   CLINICAL DATA:  Congestive heart failure  EXAM: PORTABLE CHEST - 1 VIEW  COMPARISON:  06/20/2013  FINDINGS: Cardiomegaly again noted. Status post median sternotomy. Central mild vascular congestion and mild interstitial prominence suspicious for mild interstitial edema. Worsening atelectasis or fluid in minor fissure right midlung. Stable right IJ central line position. Atherosclerotic calcifications of thoracic aorta again noted. Mild basilar atelectasis.  IMPRESSION: Central mild vascular congestion and mild interstitial prominence suspicious for mild interstitial edema. Worsening atelectasis or fluid in minor fissure right midlung. Stable right IJ central line position. Atherosclerotic calcifications of thoracic aorta again noted. Mild basilar atelectasis.   Electronically Signed   By: Natasha Mead M.D.   On: 06/22/2013 09:38   Portable Chest Xray In Am  06/20/2013   CLINICAL DATA:  Atelectasis  EXAM: PORTABLE CHEST - 1 VIEW  COMPARISON:  Prior radiograph from 06/19/2013  FINDINGS: Tip of a right IJ central venous catheter projects over the mid SVC. Median sternotomy wires are unchanged. Cardiomegaly is stable. Coarse atherosclerotic  calcifications again noted within the aortic arch. Defibrillator pad overlies the lower left chest.  Lung volumes are within normal limits. Bilateral regular interstitial thickening is similar as compared to the prior exam. More linear opacities within the bilateral lung bases, right greater than left are most consistent with atelectasis, slightly increased as compared to the prior exam. No pneumothorax.  Osseous structures are unchanged.  IMPRESSION: Bibasilar atelectasis, right greater than left, slightly increased from prior study from 06/19/2013. Otherwise no significant interval change in appearance of the heart and lungs.   Electronically Signed   By: Rise Mu M.D.   On: 06/20/2013 06:00   Dg Chest Port 1 View  06/19/2013   CLINICAL DATA:  Status post central line placement.  EXAM: PORTABLE CHEST - 1 VIEW  COMPARISON:  06/19/2013  FINDINGS: New right internal jugular central venous line has its tip in the upper aspect of the superior vena cava. No pneumothorax.  Endotracheal tube and nasogastric to are stable in well positioned.  Bilateral irregular interstitial thickening with right mid and lower lung zone hazy airspace opacity is stable. Changes from cardiac surgery are stable.  IMPRESSION: Right internal jugular central venous line has its tip in the upper aspect of the superior vena cava. No pneumothorax.  No other change from the earlier exam.   Electronically Signed   By: Amie Portland M.D.   On: 06/19/2013 13:24   Portable Chest Xray  06/19/2013   CLINICAL DATA:  Status post intubation of the trachea  EXAM: PORTABLE CHEST - 1 VIEW  COMPARISON:  Dec 23, 2012.  FINDINGS: The endotracheal tube tip lies approximately 2 cm below the inferior margin of the clavicular heads and approximately 4 cm above the carina. There is an esophagogastric tube present whose tip is not clearly below the hemidiaphragms. It may have coiled upon itself. Given that there is a subtle linear density projecting  over the lower heart border.  The left lung appears mildly hyperinflated. On  the right. Inflation is not quite is grade in their coarse lung markings suggesting fibrosis or atelectasis. The cardiac silhouette is not enlarged. The pulmonary vascularity is not engorged. There is calcification in the wall of the tortuous ascending and descending thoracic aorta.  IMPRESSION: 1. The endotracheal tube tip appears to be in reasonable position approximately 4 cm above the Carina.  2. Positioning of the esophagogastric tube is questionable. A lower thoracic, upper abdominal film would be useful to assure that the tip and proximal port of the nasogastric tube lie below the level of the GE junction.  3. Increased interstitial density at the right lung base is in part chronic but superimposed acute atelectasis or early interstitial pneumonia is not excluded.  4.  There is no evidence of CHF.   Electronically Signed   By: David  [place]   On: 06/19/2013 10:24    Microbiology: Recent Results (from the past 240 hour(s))  CULTURE, EXPECTORATED SPUTUM-ASSESSMENT     Status: None   Collection Time    06/21/13  1:00 PM      Result Value Range Status   Specimen Description SPUTUM   Final   Special Requests Normal   Final   Sputum evaluation     Final   Value: THIS SPECIMEN IS ACCEPTABLE. RESPIRATORY CULTURE REPORT TO FOLLOW.   Report Status 06/21/2013 FINAL   Final  CULTURE, RESPIRATORY (NON-EXPECTORATED)     Status: None   Collection Time    06/21/13  1:00 PM      Result Value Range Status   Specimen Description SPUTUM   Final   Special Requests NONE   Final   Gram Stain     Final   Value: NO WBC SEEN     NO SQUAMOUS EPITHELIAL CELLS SEEN     FEW GRAM POSITIVE COCCI     IN PAIRS     Performed at [facility]   Culture     Final   Value: NORMAL OROPHARYNGEAL FLORA     Performed at [facility]   Report Status 06/24/2013 FINAL   Final     Labs: Basic Metabolic Panel:  Recent Labs Lab  06/22/13 0500 06/22/13 1754 06/23/13 0500 06/24/13 0500 06/25/13 0531 06/26/13 0355 06/27/13 0540 06/27/13 0905 06/28/13 0610  NA 139  --  137 136 136 135 134*  --  133*  K 3.3*  --  3.9 4.3 4.7 4.8 6.2* 5.8* 5.1  CL 102  --  98 100 95* 92* 97  --  99  CO2 30  --  33* 31 34* 36* 29  --  26  GLUCOSE 98  --  94 87 98 90 102*  --  101*  BUN 8  --  9 10 14 21  29*  --  23  CREATININE 0.79  --  0.86 0.97 1.19 1.38* 1.65*  --  1.49*  CALCIUM 7.8*  --  8.1* 7.8* 8.5 8.8 8.7  --  9.1  MG 1.5  --  1.5 1.6 1.7 1.9 2.3  --  2.1  PHOS 2.8 3.1 3.0 2.6  --   --   --   --   --    Liver Function Tests:  Recent Labs Lab 06/24/13 0500  AST 24  ALT 16  ALKPHOS 132*  BILITOT 0.3  PROT 4.6*  ALBUMIN 1.6*   No results found for this basename: LIPASE, AMYLASE,  in the last 168 hours No results found for this basename: AMMONIA,  in  the last 168 hours CBC:  Recent Labs Lab 06/22/13 0500  WBC 10.9*  HGB 9.8*  HCT 29.2*  MCV 91.5  PLT 159   Cardiac Enzymes: No results found for this basename: CKTOTAL, CKMB, CKMBINDEX, TROPONINI,  in the last 168 hours BNP: BNP (last 3 results)  Recent Labs  12/23/12 2045  PROBNP 1188.0*   CBG:  Recent Labs Lab 06/26/13 1629  GLUCAP 125*       Signed:  Marinda Elk  [facility] 06/28/2013, 2:28 PM

## 2013-07-02 ENCOUNTER — Encounter: Payer: Self-pay | Admitting: Internal Medicine

## 2013-07-02 ENCOUNTER — Ambulatory Visit (INDEPENDENT_AMBULATORY_CARE_PROVIDER_SITE_OTHER): Payer: Medicare Other | Admitting: Internal Medicine

## 2013-07-02 VITALS — BP 98/62 | HR 109 | Temp 97.4°F | Wt 151.0 lb

## 2013-07-02 DIAGNOSIS — K219 Gastro-esophageal reflux disease without esophagitis: Secondary | ICD-10-CM

## 2013-07-02 DIAGNOSIS — J449 Chronic obstructive pulmonary disease, unspecified: Secondary | ICD-10-CM

## 2013-07-02 DIAGNOSIS — I10 Essential (primary) hypertension: Secondary | ICD-10-CM

## 2013-07-02 NOTE — Patient Instructions (Signed)
You are given the nexium samples today Please keep up the drinking fluids  - at least Half to 1 liter fluids per day Try to eat as well, as you will get more thin and then get sick as well Please continue all other medications as before, and refills have been done if requested. Please keep your appointments with your specialists as you have planned - cardiology  Please return in 2 months, or sooner if needed

## 2013-07-02 NOTE — Assessment & Plan Note (Signed)
Gave samples nexium, new rx,  to f/u any worsening symptoms or concerns

## 2013-07-02 NOTE — Assessment & Plan Note (Signed)
stable overall by history and exam, recent data reviewed with pt, and pt to continue medical treatment as before,  to f/u any worsening symptoms or concerns SpO2 Readings from Last 3 Encounters:  07/02/13 95%  06/28/13 91%  01/03/13 93%

## 2013-07-02 NOTE — Assessment & Plan Note (Signed)
stable overall by history and exam, recent data reviewed with pt, and pt to continue medical treatment as before,  to f/u any worsening symptoms or concerns BP Readings from Last 3 Encounters:  07/02/13 98/62  06/28/13 133/63  01/08/13 148/50

## 2013-07-02 NOTE — Progress Notes (Addendum)
Subjective:    [name], male    DOB: [birth year], 78 y.o.   MRN: [id number]  HPI  Here to f/u, did well for one day only it seems post d/c, then intermittent nausea, belching and reflux not taking his PPI the last 2-3 days, better this AM after taking a nexium the wife did find, has been out recently since d/c.  Feels fatigued and generally poorly but hard to be o/w specific.  Pt denies chest pain, increased sob or doe, wheezing, orthopnea, PND, increased LE swelling, palpitations, dizziness or syncope.  Pt denies new neurological symptoms such as new headache, or facial or extremity weakness or numbness  Past Medical History  Diagnosis Date  . CEREBROVASCULAR ACCIDENT, HX OF 03/02/2007  . CHEST PAIN 01/31/2010  . CHRONIC OBSTRUCTIVE PULMONARY DISEASE, ACUTE EXACERBATION 02/18/2009  . COPD 03/02/2007  . CORONARY ARTERY DISEASE 03/02/2007  . DEPRESSION 09/23/2007    anxiety  . DISEASE, PERIPHERAL VASCULAR NEC 03/02/2007  . DIVERTICULOSIS, COLON 09/23/2007  . FATIGUE 01/22/2009  . GERD 03/02/2007  . GLUCOSE INTOLERANCE 09/23/2007  . HEMORRHOIDS 10/09/2007  . HIATAL HERNIA 10/09/2007  . HYPERCHOLESTEROLEMIA 10/09/2007  . HYPERTENSION 09/23/2007  . HYPOKALEMIA 10/09/2007  . MESENTERIC VASCULAR INSUFFICIENCY 07/06/2008  . MITRAL REGURGITATION 11/27/2008  . MI 10/09/2007  . Other specified forms of hearing loss 08/23/2010  . PERIPHERAL VASCULAR DISEASE 09/23/2007  . PUD 10/09/2007  . RENAL ARTERY STENOSIS 03/02/2007  . TOBACCO ABUSE 04/20/2010  . 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 Unspecified Iron Deficiency Anemia 09/23/2007  . URINARY RETENTION 01/22/2009  . VITAMIN B12 DEFICIENCY 01/03/2010  . WRIST PAIN, RIGHT 06/06/2010  . Impaired glucose tolerance 01/29/2011  . Hearing loss in left ear 01/30/2011  . Blindness of both eyes 01/30/2011  . Hyperlipidemia   . Anemia, iron deficiency   . Atrial flutter   . HTN (hypertension) 03/21/2011  . Peripheral arterial disease    Past Surgical  History  Procedure Laterality Date  . Coronary artery bypass graft  1995    LIMA-LAD, SVG-OM, SVG-D, SVG-PDA  . Renal artery stenting  2004    bilateral  . S/p multiple le vascular bypass      includine fem-pop x 2 LLE  . S/p bilateral cea    . Coronary angioplasty with stent placement  2010    DES x 2 SVG-OM, cutting balloon PTCA SVG-PDA for ISR  . Appendectomy    . S/p lumbar disc surgury      x2  . S/p rle fem bypass feb 2011    . Cardiac catheterization    . Pr vein bypass graft,aorto-fem-pop    . Carotid endarterectomy    . Coronary angioplasty with stent placement  2011    DES to SVG-DIAG, SVG-PDA  . Esophagogastroduodenoscopy    . Colonoscopy      reports that he has been smoking Cigarettes.  He has a 60 pack-year smoking history. He has never used smokeless tobacco. He reports that he does not drink alcohol or use illicit drugs. family history includes Cancer in his father; Colon cancer (age of onset: 69) in his mother; Heart disease in his father and sister; Prostate cancer in his brother; Stroke in his sister. No Known Allergies Current Outpatient Prescriptions on File Prior to Visit  Medication Sig Dispense Refill  . amiodarone (PACERONE) 400 MG tablet Take 0.5 tablets (200 mg total) by mouth daily.  14 tablet  0  . amLODipine (NORVASC) 10 MG tablet Take  10 mg by mouth daily.      Marland Kitchen aspirin 81 MG EC tablet Take 81 mg by mouth daily.        Marland Kitchen atorvastatin (LIPITOR) 40 MG tablet Take 40 mg by mouth daily.      . carvedilol (COREG) 3.125 MG tablet Take 1 tablet (3.125 mg total) by mouth 2 (two) times daily with a meal.      . cilostazol (PLETAL) 100 MG tablet Take 100 mg by mouth 2 (two) times daily.      . clopidogrel (PLAVIX) 75 MG tablet Take 75 mg by mouth daily.      Marland Kitchen esomeprazole (NEXIUM) 40 MG capsule Take 1 capsule (40 mg total) by mouth daily.  90 capsule  3  . ferrous sulfate 325 (65 FE) MG tablet Take 325 mg by mouth 3 (three) times daily.        .  fexofenadine (ALLEGRA) 180 MG tablet Take 180 mg by mouth daily.      . furosemide (LASIX) 20 MG tablet Take 2 tablets (40 mg total) by mouth daily.  30 tablet  0  . isosorbide mononitrate (IMDUR) 60 MG 24 hr tablet Take 60 mg by mouth daily.      Marland Kitchen lisinopril (PRINIVIL,ZESTRIL) 10 MG tablet Take 10 mg by mouth daily.      . nitroGLYCERIN (NITROSTAT) 0.4 MG SL tablet Place 1 tablet (0.4 mg total) under the tongue every 5 (five) minutes as needed for chest pain.  30 tablet  0  . ranitidine (ZANTAC) 150 MG tablet Take 1 tablet (150 mg total) by mouth at bedtime.  90 tablet  3  . risperiDONE (RISPERDAL) 0.5 MG tablet Take 1 tablet (0.5 mg total) by mouth 2 (two) times daily.  90 tablet  1  . sertraline (ZOLOFT) 100 MG tablet Take 1 tablet (100 mg total) by mouth daily.  90 tablet  3   No current facility-administered medications on file prior to visit.   Review of Systems  Constitutional: Negative for or unusual diaphoresis  HENT: Negative for tinnitus.   Eyes: Negative for photophobia and visual disturbance.  Respiratory: Negative for choking and stridor.   Gastrointestinal: Negative for vomiting and blood in stool.  Genitourinary: Negative for hematuria and decreased urine volume.  Musculoskeletal: Negative for acute joint swelling Skin: Negative for color change and wound.  Neurological: Negative for tremors and numbness other than noted  Psychiatric/Behavioral: Negative for decreased concentration or  hyperactivity.       Objective:   Physical Exam BP 98/62  Pulse 109  Temp(Src) 97.4 F (36.3 C) (Oral)  Wt 151 lb (68.493 kg)  SpO2 95% VS noted, pale, fatigued Constitutional: Pt appears well-developed and well-nourished.  HENT: Head: NCAT.  Right Ear: External ear normal.  Left Ear: External ear normal.  Eyes: Conjunctivae and EOM are normal. Pupils are equal, round, and reactive to light.  Neck: Normal range of motion. Neck supple.  Cardiovascular: Normal rate and regular  rhythm.   Pulmonary/Chest: Effort normal and breath sounds decreased bilat, but no rales or wheezing  Abd:  Soft, NT, non-distended, + BS Neurological: Pt is alert. Not confused  Skin: Skin is warm. No erythema.  Psychiatric: Pt behavior is normal. Thought content normal.     Assessment & Plan:  Quality Measures addressed:  CAD  - drug therapy for lower cholesterol: pt declines further medication

## 2013-07-02 NOTE — Progress Notes (Signed)
Pre-visit discussion using our clinic review tool. No additional management support is needed unless otherwise documented below in the visit note.  

## 2013-07-03 ENCOUNTER — Ambulatory Visit: Payer: Medicare Other | Admitting: Internal Medicine

## 2013-07-03 ENCOUNTER — Encounter: Payer: Self-pay | Admitting: Internal Medicine

## 2013-07-04 ENCOUNTER — Telehealth: Payer: Self-pay | Admitting: Nurse Practitioner

## 2013-07-04 MED ORDER — ONDANSETRON HCL 4 MG PO TABS: 4.0000 mg | ORAL_TABLET | Freq: Three times a day (TID) | ORAL | Status: DC | PRN

## 2013-07-04 NOTE — Telephone Encounter (Signed)
Pts dtr called stating that since pts d/c recently, he has been experiencing intermittent nausea, poor appetite, and intermittent lower abdominal discomfort.  He saw his PCP and he was placed on Nexium, which hasn't made much of a difference.  Dtr asks if any of his new medications might be causing his Ss.  I advised that amiodarone sometimes causes nausea/anorexia.  He has been taking 200mg  bid and is due to reduce to 200mg  daily in a few days.  I rec that he reduce his dose to 200mg  daily today in order to see if this changes his Ss any.  If not, I rec that he f/u with primary care as his Ss may be more organic in nature.  She verbalized understanding.

## 2013-07-04 NOTE — Telephone Encounter (Signed)
Done per emr 

## 2013-07-06 ENCOUNTER — Encounter (HOSPITAL_COMMUNITY): Payer: Self-pay | Admitting: Emergency Medicine

## 2013-07-06 ENCOUNTER — Emergency Department (HOSPITAL_COMMUNITY): Payer: Medicare Other

## 2013-07-06 ENCOUNTER — Inpatient Hospital Stay (HOSPITAL_COMMUNITY)
Admission: EM | Admit: 2013-07-06 | Discharge: 2013-07-12 | DRG: 392 | Disposition: A | Discharge status: Home Health | Payer: Medicare Other | Attending: Internal Medicine | Admitting: Internal Medicine

## 2013-07-06 DIAGNOSIS — I472 Ventricular tachycardia, unspecified: Secondary | ICD-10-CM | POA: Diagnosis present

## 2013-07-06 DIAGNOSIS — I252 Old myocardial infarction: Secondary | ICD-10-CM

## 2013-07-06 DIAGNOSIS — K259 Gastric ulcer, unspecified as acute or chronic, without hemorrhage or perforation: Secondary | ICD-10-CM | POA: Diagnosis present

## 2013-07-06 DIAGNOSIS — Z79899 Other long term (current) drug therapy: Secondary | ICD-10-CM

## 2013-07-06 DIAGNOSIS — K2980 Duodenitis without bleeding: Secondary | ICD-10-CM | POA: Diagnosis present

## 2013-07-06 DIAGNOSIS — Z860100 Personal history of colon polyps, unspecified: Secondary | ICD-10-CM

## 2013-07-06 DIAGNOSIS — Z8673 Personal history of transient ischemic attack (TIA), and cerebral infarction without residual deficits: Secondary | ICD-10-CM

## 2013-07-06 DIAGNOSIS — L8990 Pressure ulcer of unspecified site, unspecified stage: Secondary | ICD-10-CM | POA: Diagnosis present

## 2013-07-06 DIAGNOSIS — K219 Gastro-esophageal reflux disease without esophagitis: Secondary | ICD-10-CM | POA: Diagnosis present

## 2013-07-06 DIAGNOSIS — Z9089 Acquired absence of other organs: Secondary | ICD-10-CM

## 2013-07-06 DIAGNOSIS — I5022 Chronic systolic (congestive) heart failure: Secondary | ICD-10-CM | POA: Diagnosis present

## 2013-07-06 DIAGNOSIS — Z8042 Family history of malignant neoplasm of prostate: Secondary | ICD-10-CM

## 2013-07-06 DIAGNOSIS — I4729 Other ventricular tachycardia: Secondary | ICD-10-CM | POA: Diagnosis present

## 2013-07-06 DIAGNOSIS — E538 Deficiency of other specified B group vitamins: Secondary | ICD-10-CM | POA: Diagnosis present

## 2013-07-06 DIAGNOSIS — H9190 Unspecified hearing loss, unspecified ear: Secondary | ICD-10-CM | POA: Diagnosis present

## 2013-07-06 DIAGNOSIS — F329 Major depressive disorder, single episode, unspecified: Secondary | ICD-10-CM | POA: Diagnosis present

## 2013-07-06 DIAGNOSIS — J449 Chronic obstructive pulmonary disease, unspecified: Secondary | ICD-10-CM | POA: Diagnosis present

## 2013-07-06 DIAGNOSIS — Z823 Family history of stroke: Secondary | ICD-10-CM

## 2013-07-06 DIAGNOSIS — E860 Dehydration: Secondary | ICD-10-CM | POA: Diagnosis present

## 2013-07-06 DIAGNOSIS — I959 Hypotension, unspecified: Secondary | ICD-10-CM | POA: Diagnosis present

## 2013-07-06 DIAGNOSIS — F17200 Nicotine dependence, unspecified, uncomplicated: Secondary | ICD-10-CM | POA: Diagnosis present

## 2013-07-06 DIAGNOSIS — Z8249 Family history of ischemic heart disease and other diseases of the circulatory system: Secondary | ICD-10-CM

## 2013-07-06 DIAGNOSIS — N179 Acute kidney failure, unspecified: Secondary | ICD-10-CM | POA: Diagnosis present

## 2013-07-06 DIAGNOSIS — E876 Hypokalemia: Secondary | ICD-10-CM | POA: Diagnosis present

## 2013-07-06 DIAGNOSIS — Z951 Presence of aortocoronary bypass graft: Secondary | ICD-10-CM

## 2013-07-06 DIAGNOSIS — R1013 Epigastric pain: Secondary | ICD-10-CM

## 2013-07-06 DIAGNOSIS — E785 Hyperlipidemia, unspecified: Secondary | ICD-10-CM | POA: Diagnosis present

## 2013-07-06 DIAGNOSIS — I4891 Unspecified atrial fibrillation: Secondary | ICD-10-CM

## 2013-07-06 DIAGNOSIS — J4489 Other specified chronic obstructive pulmonary disease: Secondary | ICD-10-CM | POA: Diagnosis present

## 2013-07-06 DIAGNOSIS — Z9861 Coronary angioplasty status: Secondary | ICD-10-CM

## 2013-07-06 DIAGNOSIS — L89109 Pressure ulcer of unspecified part of back, unspecified stage: Secondary | ICD-10-CM | POA: Diagnosis present

## 2013-07-06 DIAGNOSIS — K8020 Calculus of gallbladder without cholecystitis without obstruction: Secondary | ICD-10-CM | POA: Diagnosis present

## 2013-07-06 DIAGNOSIS — R112 Nausea with vomiting, unspecified: Secondary | ICD-10-CM

## 2013-07-06 DIAGNOSIS — I2510 Atherosclerotic heart disease of native coronary artery without angina pectoris: Secondary | ICD-10-CM | POA: Diagnosis present

## 2013-07-06 DIAGNOSIS — I509 Heart failure, unspecified: Secondary | ICD-10-CM | POA: Diagnosis present

## 2013-07-06 DIAGNOSIS — I10 Essential (primary) hypertension: Secondary | ICD-10-CM | POA: Diagnosis present

## 2013-07-06 DIAGNOSIS — A0811 Acute gastroenteropathy due to Norwalk agent: Principal | ICD-10-CM | POA: Diagnosis present

## 2013-07-06 DIAGNOSIS — Z800 Family history of malignant neoplasm of digestive organs: Secondary | ICD-10-CM

## 2013-07-06 DIAGNOSIS — D62 Acute posthemorrhagic anemia: Secondary | ICD-10-CM | POA: Diagnosis present

## 2013-07-06 DIAGNOSIS — H543 Unqualified visual loss, both eyes: Secondary | ICD-10-CM | POA: Diagnosis present

## 2013-07-06 DIAGNOSIS — F3289 Other specified depressive episodes: Secondary | ICD-10-CM | POA: Diagnosis present

## 2013-07-06 HISTORY — DX: Peptic ulcer, site unspecified, unspecified as acute or chronic, without hemorrhage or perforation: K27.9

## 2013-07-06 HISTORY — DX: Arteriovenous malformation, site unspecified: Q27.30

## 2013-07-06 LAB — CBC WITH DIFFERENTIAL/PLATELET
Basophils Absolute: 0 10*3/uL (ref 0.0–0.1)
Eosinophils Relative: 1 % (ref 0–5)
HCT: 29.1 % — ABNORMAL LOW (ref 39.0–52.0)
Hemoglobin: 9.8 g/dL — ABNORMAL LOW (ref 13.0–17.0)
Lymphocytes Relative: 12 % (ref 12–46)
MCHC: 33.7 g/dL (ref 30.0–36.0)
MCV: 93.9 fL (ref 78.0–100.0)
Monocytes Absolute: 0.8 10*3/uL (ref 0.1–1.0)
Monocytes Relative: 7 % (ref 3–12)
Neutro Abs: 8.9 10*3/uL — ABNORMAL HIGH (ref 1.7–7.7)
RDW: 14.9 % (ref 11.5–15.5)

## 2013-07-06 LAB — LACTIC ACID, PLASMA: Lactic Acid, Venous: 0.9 mmol/L (ref 0.5–2.2)

## 2013-07-06 LAB — COMPREHENSIVE METABOLIC PANEL
ALT: 11 U/L (ref 0–53)
AST: 13 U/L (ref 0–37)
BUN: 35 mg/dL — ABNORMAL HIGH (ref 6–23)
CO2: 22 mEq/L (ref 19–32)
Calcium: 8.7 mg/dL (ref 8.4–10.5)
Chloride: 100 mEq/L (ref 96–112)
Creatinine, Ser: 2.41 mg/dL — ABNORMAL HIGH (ref 0.50–1.35)
GFR calc Af Amer: 28 mL/min — ABNORMAL LOW (ref >90)
GFR calc non Af Amer: 25 mL/min — ABNORMAL LOW (ref >90)
Sodium: 133 mEq/L — ABNORMAL LOW (ref 135–145)
Total Bilirubin: 0.2 mg/dL — ABNORMAL LOW (ref 0.3–1.2)

## 2013-07-06 LAB — URINALYSIS, ROUTINE W REFLEX MICROSCOPIC
Glucose, UA: NEGATIVE mg/dL
Ketones, ur: NEGATIVE mg/dL
Leukocytes, UA: NEGATIVE
Nitrite: NEGATIVE
Protein, ur: NEGATIVE mg/dL
Urobilinogen, UA: 0.2 mg/dL (ref 0.0–1.0)

## 2013-07-06 MED ORDER — ONDANSETRON HCL 4 MG PO TABS
4.0000 mg | ORAL_TABLET | Freq: Four times a day (QID) | ORAL | Status: DC | PRN
  Administered 2013-07-07: 4 mg via ORAL
  Filled 2013-07-06: qty 1

## 2013-07-06 MED ORDER — ALBUTEROL SULFATE (5 MG/ML) 0.5% IN NEBU: 2.5000 mg | INHALATION_SOLUTION | RESPIRATORY_TRACT | Status: DC | PRN

## 2013-07-06 MED ORDER — ONDANSETRON HCL 4 MG/2ML IJ SOLN: 4.0000 mg | Freq: Four times a day (QID) | INTRAMUSCULAR | Status: DC | PRN

## 2013-07-06 MED ORDER — FERROUS SULFATE 325 (65 FE) MG PO TABS
325.0000 mg | ORAL_TABLET | Freq: Three times a day (TID) | ORAL | Status: DC
  Administered 2013-07-06 – 2013-07-07 (×4): 325 mg via ORAL
  Filled 2013-07-06 (×7): qty 1

## 2013-07-06 MED ORDER — ENOXAPARIN SODIUM 30 MG/0.3ML ~~LOC~~ SOLN
30.0000 mg | SUBCUTANEOUS | Status: DC
  Administered 2013-07-07: 30 mg via SUBCUTANEOUS
  Filled 2013-07-06: qty 0.3

## 2013-07-06 MED ORDER — FAMOTIDINE 20 MG PO TABS
20.0000 mg | ORAL_TABLET | Freq: Every day | ORAL | Status: DC
  Administered 2013-07-06 – 2013-07-11 (×6): 20 mg via ORAL
  Filled 2013-07-06 (×8): qty 1

## 2013-07-06 MED ORDER — CILOSTAZOL 100 MG PO TABS
100.0000 mg | ORAL_TABLET | Freq: Two times a day (BID) | ORAL | Status: DC
  Administered 2013-07-06 – 2013-07-12 (×12): 100 mg via ORAL
  Filled 2013-07-06 (×14): qty 1

## 2013-07-06 MED ORDER — RISPERIDONE 0.5 MG PO TABS
0.5000 mg | ORAL_TABLET | Freq: Two times a day (BID) | ORAL | Status: DC
  Administered 2013-07-06 – 2013-07-12 (×12): 0.5 mg via ORAL
  Filled 2013-07-06 (×14): qty 1

## 2013-07-06 MED ORDER — PANTOPRAZOLE SODIUM 40 MG PO TBEC
40.0000 mg | DELAYED_RELEASE_TABLET | Freq: Every day | ORAL | Status: DC
  Administered 2013-07-07 – 2013-07-09 (×3): 40 mg via ORAL
  Filled 2013-07-06 (×3): qty 1

## 2013-07-06 MED ORDER — CARVEDILOL 3.125 MG PO TABS
3.1250 mg | ORAL_TABLET | Freq: Two times a day (BID) | ORAL | Status: DC
  Administered 2013-07-07 – 2013-07-12 (×9): 3.125 mg via ORAL
  Filled 2013-07-06 (×15): qty 1

## 2013-07-06 MED ORDER — ACETAMINOPHEN 650 MG RE SUPP: 650.0000 mg | Freq: Four times a day (QID) | RECTAL | Status: DC | PRN

## 2013-07-06 MED ORDER — ATORVASTATIN CALCIUM 40 MG PO TABS
40.0000 mg | ORAL_TABLET | Freq: Every day | ORAL | Status: DC
  Administered 2013-07-06 – 2013-07-11 (×6): 40 mg via ORAL
  Filled 2013-07-06 (×8): qty 1

## 2013-07-06 MED ORDER — HYDROCODONE-ACETAMINOPHEN 5-325 MG PO TABS
1.0000 | ORAL_TABLET | ORAL | Status: AC | PRN
  Administered 2013-07-07: 1 via ORAL
  Filled 2013-07-06: qty 1

## 2013-07-06 MED ORDER — ACETAMINOPHEN 325 MG PO TABS
650.0000 mg | ORAL_TABLET | Freq: Four times a day (QID) | ORAL | Status: DC | PRN
  Administered 2013-07-07: 650 mg via ORAL
  Filled 2013-07-06: qty 2

## 2013-07-06 MED ORDER — AMIODARONE HCL 200 MG PO TABS
200.0000 mg | ORAL_TABLET | Freq: Every day | ORAL | Status: DC
  Administered 2013-07-07 – 2013-07-12 (×6): 200 mg via ORAL
  Filled 2013-07-06 (×6): qty 1

## 2013-07-06 MED ORDER — SODIUM CHLORIDE 0.9 % IV SOLN
INTRAVENOUS | Status: AC
  Administered 2013-07-06: 22:00:00 via INTRAVENOUS

## 2013-07-06 MED ORDER — ASPIRIN EC 81 MG PO TBEC
81.0000 mg | DELAYED_RELEASE_TABLET | Freq: Every day | ORAL | Status: DC
  Administered 2013-07-07 – 2013-07-08 (×2): 81 mg via ORAL
  Filled 2013-07-06 (×3): qty 1

## 2013-07-06 MED ORDER — POTASSIUM CHLORIDE 10 MEQ/100ML IV SOLN
10.0000 meq | INTRAVENOUS | Status: AC
  Administered 2013-07-06 – 2013-07-07 (×2): 10 meq via INTRAVENOUS
  Filled 2013-07-06 (×2): qty 100

## 2013-07-06 MED ORDER — SERTRALINE HCL 100 MG PO TABS
100.0000 mg | ORAL_TABLET | Freq: Every day | ORAL | Status: DC
  Administered 2013-07-07 – 2013-07-12 (×6): 100 mg via ORAL
  Filled 2013-07-06 (×6): qty 1

## 2013-07-06 MED ORDER — SODIUM CHLORIDE 0.9 % IJ SOLN
3.0000 mL | Freq: Two times a day (BID) | INTRAMUSCULAR | Status: DC
  Administered 2013-07-06 – 2013-07-12 (×10): 3 mL via INTRAVENOUS

## 2013-07-06 MED ORDER — SODIUM CHLORIDE 0.9 % IV BOLUS (SEPSIS)
1000.0000 mL | Freq: Once | INTRAVENOUS | Status: AC
  Administered 2013-07-06: 1000 mL via INTRAVENOUS

## 2013-07-06 MED ORDER — CLOPIDOGREL BISULFATE 75 MG PO TABS
75.0000 mg | ORAL_TABLET | Freq: Every day | ORAL | Status: DC
  Administered 2013-07-07 – 2013-07-08 (×2): 75 mg via ORAL
  Filled 2013-07-06 (×3): qty 1

## 2013-07-06 NOTE — H&P (Signed)
PCP: [name], MD  Cardiology: Romilda Garret GI: [facility]   Chief Complaint:  Nausea and poor PO  HPI: [name] is a 78 y.o. male   has a past medical history of CEREBROVASCULAR ACCIDENT, HX OF (03/02/2007); CHEST PAIN (01/31/2010); CHRONIC OBSTRUCTIVE PULMONARY DISEASE, ACUTE EXACERBATION (02/18/2009); COPD (03/02/2007); CORONARY ARTERY DISEASE (03/02/2007); DEPRESSION (09/23/2007); DISEASE, PERIPHERAL VASCULAR NEC (03/02/2007); DIVERTICULOSIS, COLON (09/23/2007); FATIGUE (01/22/2009); GERD (03/02/2007); GLUCOSE INTOLERANCE (09/23/2007); HEMORRHOIDS (10/09/2007); HIATAL HERNIA (10/09/2007); HYPERCHOLESTEROLEMIA (10/09/2007); HYPERTENSION (09/23/2007); HYPOKALEMIA (10/09/2007); MESENTERIC VASCULAR INSUFFICIENCY (07/06/2008); MITRAL REGURGITATION (11/27/2008); MI (10/09/2007); Other specified forms of hearing loss (08/23/2010); PERIPHERAL VASCULAR DISEASE (09/23/2007); PUD (10/09/2007); RENAL ARTERY STENOSIS (03/02/2007); TOBACCO ABUSE (04/20/2010); TUBULOVILLOUS ADENOMA, COLON, HX OF (02/07/2010); Unspecified Iron Deficiency Anemia (09/23/2007); URINARY RETENTION (01/22/2009); VITAMIN B12 DEFICIENCY (01/03/2010); WRIST PAIN, RIGHT (06/06/2010); Impaired glucose tolerance (01/29/2011); Hearing loss in left ear (01/30/2011); Blindness of both eyes (01/30/2011); Hyperlipidemia; Anemia, iron deficiency; Atrial flutter; HTN (hypertension) (03/21/2011); and Peripheral arterial disease.   Presented with  Last month patient was admitted with Enteritis though to be due to Norvirus. HE became severely dehydrated and went into VT and cardiac arrest. Patient REQUIRED INTUBATION and spend time in ICU . He was discharged to home last week but continued to have nausea occasional vomiting and at least one loose BM a day. Denies any fever or chills.  He reported some abdominal pain but no chest pain. Family reports sacral decubitus. Patient has severe fatigue.  Family declined PT/OT at home stating patient is too sick.   Review of  Systems:    Pertinent positives include:, fatigue, indigestion, abdominal pain, nausea, vomiting, diarrhea  Constitutional:  No weight loss, night sweats, Fevers, chills weight loss  HEENT:  No headaches, Difficulty swallowing,Tooth/dental problems,Sore throat,  No sneezing, itching, ear ache, nasal congestion, post nasal drip,  Cardio-vascular:  No chest pain, Orthopnea, PND, anasarca, dizziness, palpitations.no Bilateral lower extremity swelling  GI:  No heartburn,, change in bowel habits, loss of appetite, melena, blood in stool, hematemesis Resp:  no shortness of breath at rest. No dyspnea on exertion, No excess mucus, no productive cough, No non-productive cough, No coughing up of blood.No change in color of mucus.No wheezing. Skin:  no rash or lesions. No jaundice GU:  no dysuria, change in color of urine, no urgency or frequency. No straining to urinate.  No flank pain.  Musculoskeletal:  No joint pain or no joint swelling. No decreased range of motion. No back pain.  Psych:  No change in mood or affect. No depression or anxiety. No memory loss.  Neuro: no localizing neurological complaints, no tingling, no weakness, no double vision, no gait abnormality, no slurred speech, no confusion  Otherwise ROS are negative except for above, 10 systems were reviewed  Past Medical History: Past Medical History  Diagnosis Date  . CEREBROVASCULAR ACCIDENT, HX OF 03/02/2007  . CHEST PAIN 01/31/2010  . CHRONIC OBSTRUCTIVE PULMONARY DISEASE, ACUTE EXACERBATION 02/18/2009  . COPD 03/02/2007  . CORONARY ARTERY DISEASE 03/02/2007  . DEPRESSION 09/23/2007    anxiety  . DISEASE, PERIPHERAL VASCULAR NEC 03/02/2007  . DIVERTICULOSIS, COLON 09/23/2007  . FATIGUE 01/22/2009  . GERD 03/02/2007  . GLUCOSE INTOLERANCE 09/23/2007  . HEMORRHOIDS 10/09/2007  . HIATAL HERNIA 10/09/2007  . HYPERCHOLESTEROLEMIA 10/09/2007  . HYPERTENSION 09/23/2007  . HYPOKALEMIA 10/09/2007  . MESENTERIC VASCULAR INSUFFICIENCY  07/06/2008  . MITRAL REGURGITATION 11/27/2008  . MI 10/09/2007  . Other specified forms of hearing loss 08/23/2010  . PERIPHERAL VASCULAR DISEASE 09/23/2007  .  PUD 10/09/2007  . RENAL ARTERY STENOSIS 03/02/2007  . TOBACCO ABUSE 04/20/2010  . 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 Unspecified Iron Deficiency Anemia 09/23/2007  . URINARY RETENTION 01/22/2009  . VITAMIN B12 DEFICIENCY 01/03/2010  . WRIST PAIN, RIGHT 06/06/2010  . Impaired glucose tolerance 01/29/2011  . Hearing loss in left ear 01/30/2011  . Blindness of both eyes 01/30/2011  . Hyperlipidemia   . Anemia, iron deficiency   . Atrial flutter   . HTN (hypertension) 03/21/2011  . Peripheral arterial disease    Past Surgical History  Procedure Laterality Date  . Coronary artery bypass graft  1995    LIMA-LAD, SVG-OM, SVG-D, SVG-PDA  . Renal artery stenting  2004    bilateral  . S/p multiple le vascular bypass      includine fem-pop x 2 LLE  . S/p bilateral cea    . Coronary angioplasty with stent placement  2010    DES x 2 SVG-OM, cutting balloon PTCA SVG-PDA for ISR  . Appendectomy    . S/p lumbar disc surgury      x2  . S/p rle fem bypass feb 2011    . Cardiac catheterization    . Pr vein bypass graft,aorto-fem-pop    . Carotid endarterectomy    . Coronary angioplasty with stent placement  2011    DES to SVG-DIAG, SVG-PDA  . Esophagogastroduodenoscopy    . Colonoscopy       Medications: Prior to Admission medications   Medication Sig Start Date End Date Taking? Authorizing Provider  acetaminophen (TYLENOL) 500 MG tablet Take 500 mg by mouth every 6 (six) hours as needed.   Yes Historical Provider, MD  amiodarone (PACERONE) 400 MG tablet Take 0.5 tablets (200 mg total) by mouth daily. 06/28/13 07/06/13 Yes Marinda Elk, MD  amLODipine (NORVASC) 10 MG tablet Take 10 mg by mouth daily.   Yes Historical Provider, MD  aspirin 81 MG EC tablet Take 81 mg by mouth daily.     Yes Historical Provider, MD   atorvastatin (LIPITOR) 40 MG tablet Take 40 mg by mouth daily.   Yes Historical Provider, MD  carvedilol (COREG) 3.125 MG tablet Take 1 tablet (3.125 mg total) by mouth 2 (two) times daily with a meal. 06/28/13  Yes Marinda Elk, MD  cilostazol (PLETAL) 100 MG tablet Take 100 mg by mouth 2 (two) times daily.   Yes Historical Provider, MD  clopidogrel (PLAVIX) 75 MG tablet Take 75 mg by mouth daily.   Yes Historical Provider, MD  esomeprazole (NEXIUM) 40 MG capsule Take 1 capsule (40 mg total) by mouth daily. 12/20/12  Yes Corwin Levins, MD  ferrous sulfate 325 (65 FE) MG tablet Take 325 mg by mouth 3 (three) times daily.     Yes Historical Provider, MD  furosemide (LASIX) 20 MG tablet Take 2 tablets (40 mg total) by mouth daily. 06/28/13  Yes Marinda Elk, MD  isosorbide mononitrate (IMDUR) 60 MG 24 hr tablet Take 60 mg by mouth daily.   Yes Historical Provider, MD  lisinopril (PRINIVIL,ZESTRIL) 10 MG tablet Take 10 mg by mouth daily.   Yes Historical Provider, MD  ondansetron (ZOFRAN) 4 MG tablet Take 1 tablet (4 mg total) by mouth every 8 (eight) hours as needed for nausea or vomiting. 07/04/13  Yes Corwin Levins, MD  ranitidine (ZANTAC) 150 MG tablet Take 1 tablet (150 mg total) by mouth at bedtime. 01/03/13  Yes Corwin Levins, MD  risperiDONE (RISPERDAL)  0.5 MG tablet Take 1 tablet (0.5 mg total) by mouth 2 (two) times daily. 06/10/13  Yes Corwin Levins, MD  sertraline (ZOLOFT) 100 MG tablet Take 1 tablet (100 mg total) by mouth daily. 01/03/13  Yes Corwin Levins, MD  nitroGLYCERIN (NITROSTAT) 0.4 MG SL tablet Place 1 tablet (0.4 mg total) under the tongue every 5 (five) minutes as needed for chest pain. 12/17/12   Rhetta Mura, MD    Allergies:  No Known Allergies  Social History:  Ambulatory with help Lives at  Home with family   reports that he has been smoking Cigarettes.  He has a 60 pack-year smoking history. He has never used smokeless tobacco. He reports that he does  not drink alcohol or use illicit drugs.   Family History: family history includes Cancer in his father; Colon cancer (age of onset: 66) in his mother; Heart disease in his father and sister; Prostate cancer in his brother; Stroke in his sister.    Physical Exam: Patient Vitals for the past 24 hrs:  BP Temp Temp src Pulse Resp SpO2  07/06/13 1955 - - - - - 95 %  07/06/13 1915 92/37 mmHg - - 83 24 91 %  07/06/13 1905 - - - 79 29 95 %  07/06/13 1904 100/38 mmHg - - - - -  07/06/13 1800 106/40 mmHg - - 82 24 94 %  07/06/13 1710 113/38 mmHg 97.6 F (36.4 C) Oral 78 20 97 %    1. General:  in No Acute distress, thin and frail 2. Psychological: Alert and  Oriented 3. Head/ENT:     Dry Mucous Membranes                          Head Non traumatic, neck supple                           Poor Dentition 4. SKIN:   decreased Skin turgor,  Skin clean Dry and intact no rash 5. Heart: Regular rate and rhythm no Murmur, Rub or gallop 6. Lungs:   no wheezes or crackles  But distant 7. Abdomen: Soft, non-tender, Non distended, thin 8. Lower extremities: no clubbing, cyanosis, or edema 9. Neurologically Grossly intact, moving all 4 extremities equally 10. MSK: Normal range of motion  body mass index is unknown because there is no weight on file.   Labs on Admission:   Recent Labs  07/06/13 1800  NA 133*  K 3.2*  CL 100  CO2 22  GLUCOSE 111*  BUN 35*  CREATININE 2.41*  CALCIUM 8.7    Recent Labs  07/06/13 1800  AST 13  ALT 11  ALKPHOS 176*  BILITOT 0.2*  PROT 5.9*  ALBUMIN 2.5*   No results found for this basename: LIPASE, AMYLASE,  in the last 72 hours  Recent Labs  07/06/13 1800  WBC 11.1*  NEUTROABS 8.9*  HGB 9.8*  HCT 29.1*  MCV 93.9  PLT 423*   No results found for this basename: CKTOTAL, CKMB, CKMBINDEX, TROPONINI,  in the last 72 hours No results found for this basename: TSH, T4TOTAL, FREET3, T3FREE, THYROIDAB,  in the last 72 hours No results found for this  basename: VITAMINB12, FOLATE, FERRITIN, TIBC, IRON, RETICCTPCT,  in the last 72 hours Lab Results  Component Value Date   HGBA1C 6.1 05/14/2012    The CrCl is unknown because both a height and weight (  above a minimum accepted value) are required for this calculation. ABG    Component Value Date/Time   PHART 7.260* 06/19/2013 1140   HCO3 19.1* 06/19/2013 1140   TCO2 27 06/21/2013 0401   ACIDBASEDEF 8.0* 06/19/2013 1140   O2SAT 100.0 06/19/2013 1140     Lab Results  Component Value Date   DDIMER 2.04* 06/18/2013     Other results:  I have pearsonaly reviewed this: ECG REPORT  Rate: 82  Rhythm: RBBB ST&T Change: no change  UA no evidence of infection Cultures:    Component Value Date/Time   SDES SPUTUM 06/21/2013 1300   SDES SPUTUM 06/21/2013 1300   SPECREQUEST Normal 06/21/2013 1300   SPECREQUEST NONE 06/21/2013 1300   CULT  Value: NORMAL OROPHARYNGEAL FLORA Performed at [facility] 06/21/2013 1300   REPTSTATUS 06/21/2013 FINAL 06/21/2013 1300   REPTSTATUS 06/24/2013 FINAL 06/21/2013 1300       Radiological Exams on Admission: Dg Chest 2 View  07/06/2013   CLINICAL DATA:  Epigastric pain for 1 week, low blood pressure  EXAM: CHEST  2 VIEW  COMPARISON:  06/25/2013  FINDINGS: Heart size mildly enlarged. Heavy aortic arch calcification. Vascular pattern normal. Mild diffuse interstitial change most prominent in the right middle lobe and left base. Unchanged appearance when compared to prior study, except for mild decrease in the overall prominence of interstitial change. Right internal jugular catheter has been removed.  IMPRESSION: Decreased interstitial conspicuity suggesting resolving interstitial pulmonary edema.   Electronically Signed   By: Esperanza Heir M.D.   On: 07/06/2013 18:50    Chart has been reviewed  Assessment/Plan  78 yo M with hx of recent bout of enteritis here with persistent diarrhea and dehydartion  Present on Admission:  . Dehydration -  administer IVF hold laisx . Ventricular tachycardia, paroxysmal - continue coreg and monitor on tele . Hypotension - improving with IVF likely due to dehydration, lactic acid WNL . Enteritis due to Norovirus - persistent diarrhea although improved. Will send for stool culture and c.diff if negative would try to start imodium or lomotil . CORONARY ARTERY DISEASE - no chest pain but given hypotension will cycle CE. Marland Kitchen Atrial fibrillation with RVR - continue amiodarone . Acute renal failure - secondary to dehydration, give IVF, will check renal US.     Prophylaxis: Lovenox, Protonix  CODE STATUS: FULL CODE  Other plan as per orders.  I have spent a total of 55 min on this admission  Jemma Rasp 07/06/2013, 8:48 PM

## 2013-07-06 NOTE — ED Notes (Signed)
Per EMS, pt coming from home c/o abd pain. Pt spouse reports he was d/c from hospital x 1 week ago with Norovirus. Pt c/o continued N/V/D, reports he took 2 prescribed Zofran, last dose at 1300. EMS reports hypotension, initial BP 88/20, placed in trendelenburg BP increased to 100/46. Upon arrival, BP 102/47. Pt reports recent MI during hospital stay x 1 week ago, pt went into cardiac arrest. Currently VSS, NAD.

## 2013-07-06 NOTE — ED Provider Notes (Signed)
78 year old male comes in with vomiting and diarrhea. He had been admitted to the hospital recently with similar symptoms that would bar virus and suffered a cardiac arrest. This episode is not as severe. He denies fever or chills but feels very weak. On exam, he is ill-appearing but vital signs are normal. Lungs are clear and heart has regular rate and rhythm. Abdomen is soft and nontender bowel sounds decreased. Laboratory workup does show a bump up in BUN and creatinine suggesting some degree of dehydration and acute kidney injury. He will need to be admitted for ongoing hydration and observation.  I saw and evaluated the patient, reviewed the resident's note and I agree with the findings and plan.  EKG Interpretation    Date/Time:  Sunday July 06 2013 17:00:53 EST Ventricular Rate:  82 PR Interval:  109 QRS Duration: 184 QT Interval:  574 QTC Calculation: 671 R Axis:   -87 Text Interpretation:  Sinus rhythm Short PR interval RBBB and LAFB Probable left ventricular hypertrophy When compared with ECG of 06/21/2013, No significant change was found Confirmed by Preston Fleeting  MD, Clydia Nieves ([id number]) on 07/06/2013 5:09:58 PM              Dione Booze, MD 07/06/13 2337

## 2013-07-06 NOTE — ED Provider Notes (Cosign Needed)
CSN: [id number]     Arrival date & time 07/06/13  1652 History   First MD Initiated Contact with Patient 07/06/13 1737     Chief Complaint  Patient presents with  . Abdominal Pain  . Hypotension   (Consider location/radiation/quality/duration/timing/severity/associated sxs/prior Treatment) HPI Roberto MAIRENA is a 78 y.o. male with history of CAD, atrial fibrillation, and VTwho presents to the emergency department for concern of vomiting and diarrhea.  Patient was admitted on 11/4 with similar symptoms.  At that point in time he was dehydrated to the point to cause ARF and significant acidosis.  Patient admitted to floor and rapidly deteriorated to CPR in next day.  Ultimately got back and diagnosed with Norovirus.  Patient discharged one week ago and has continued N/V/D.  Then today, patient feeling weaker.  No fevers.  Acknowledges decreased urination.  No cough.  No other symptoms.  Decided to come in for further evaluation.  Upon EMS arrival, patient hypotensive and frail appearing.  No interventions PTA.   Past Medical History  Diagnosis Date  . CEREBROVASCULAR ACCIDENT, HX OF 03/02/2007  . CHEST PAIN 01/31/2010  . CHRONIC OBSTRUCTIVE PULMONARY DISEASE, ACUTE EXACERBATION 02/18/2009  . COPD 03/02/2007  . CORONARY ARTERY DISEASE 03/02/2007  . DEPRESSION 09/23/2007    anxiety  . DISEASE, PERIPHERAL VASCULAR NEC 03/02/2007  . DIVERTICULOSIS, COLON 09/23/2007  . FATIGUE 01/22/2009  . GERD 03/02/2007  . GLUCOSE INTOLERANCE 09/23/2007  . HEMORRHOIDS 10/09/2007  . HIATAL HERNIA 10/09/2007  . HYPERCHOLESTEROLEMIA 10/09/2007  . HYPERTENSION 09/23/2007  . HYPOKALEMIA 10/09/2007  . MESENTERIC VASCULAR INSUFFICIENCY 07/06/2008  . MITRAL REGURGITATION 11/27/2008  . MI 10/09/2007  . Other specified forms of hearing loss 08/23/2010  . PERIPHERAL VASCULAR DISEASE 09/23/2007  . PUD 10/09/2007  . RENAL ARTERY STENOSIS 03/02/2007  . TOBACCO ABUSE 04/20/2010  . 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 Unspecified  Iron Deficiency Anemia 09/23/2007  . URINARY RETENTION 01/22/2009  . VITAMIN B12 DEFICIENCY 01/03/2010  . WRIST PAIN, RIGHT 06/06/2010  . Impaired glucose tolerance 01/29/2011  . Hearing loss in left ear 01/30/2011  . Blindness of both eyes 01/30/2011  . Hyperlipidemia   . Anemia, iron deficiency   . Atrial flutter   . HTN (hypertension) 03/21/2011  . Peripheral arterial disease    Past Surgical History  Procedure Laterality Date  . Coronary artery bypass graft  1995    LIMA-LAD, SVG-OM, SVG-D, SVG-PDA  . Renal artery stenting  2004    bilateral  . S/p multiple le vascular bypass      includine fem-pop x 2 LLE  . S/p bilateral cea    . Coronary angioplasty with stent placement  2010    DES x 2 SVG-OM, cutting balloon PTCA SVG-PDA for ISR  . Appendectomy    . S/p lumbar disc surgury      x2  . S/p rle fem bypass feb 2011    . Cardiac catheterization    . Pr vein bypass graft,aorto-fem-pop    . Carotid endarterectomy    . Coronary angioplasty with stent placement  2011    DES to SVG-DIAG, SVG-PDA  . Esophagogastroduodenoscopy    . Colonoscopy     Family History  Problem Relation Age of Onset  . Colon cancer Mother 77  . Heart disease Father   . Cancer Father   . Heart disease Sister   . Prostate cancer Brother     prostate cancer  . Stroke Sister    History  Substance  Use Topics  . Smoking status: Current Every Day Smoker -- 1.00 packs/day for 60 years    Types: Cigarettes  . Smokeless tobacco: Never Used     Comment: 1 ppd  . Alcohol Use: No    Review of Systems  Constitutional: Positive for fatigue. Negative for fever and chills.  HENT: Positive for congestion and sore throat.   Respiratory: Positive for cough.   Gastrointestinal: Negative for nausea, vomiting, abdominal pain, diarrhea and constipation.  Endocrine: Negative for polyuria.  Genitourinary: Negative for dysuria and hematuria.  Musculoskeletal: Negative for neck pain.  Skin: Negative for rash.   Neurological: Negative for headaches.  Psychiatric/Behavioral: Negative.   All other systems reviewed and are negative.    Allergies  Review of patient's allergies indicates no known allergies.  Home Medications   Current Outpatient Rx  Name  Route  Sig  Dispense  Refill  . EXPIRED: amiodarone (PACERONE) 400 MG tablet   Oral   Take 0.5 tablets (200 mg total) by mouth daily.   14 tablet   0   . amLODipine (NORVASC) 10 MG tablet   Oral   Take 10 mg by mouth daily.         Marland Kitchen aspirin 81 MG EC tablet   Oral   Take 81 mg by mouth daily.           Marland Kitchen atorvastatin (LIPITOR) 40 MG tablet   Oral   Take 40 mg by mouth daily.         . carvedilol (COREG) 3.125 MG tablet   Oral   Take 1 tablet (3.125 mg total) by mouth 2 (two) times daily with a meal.         . cilostazol (PLETAL) 100 MG tablet   Oral   Take 100 mg by mouth 2 (two) times daily.         . clopidogrel (PLAVIX) 75 MG tablet   Oral   Take 75 mg by mouth daily.         Marland Kitchen esomeprazole (NEXIUM) 40 MG capsule   Oral   Take 1 capsule (40 mg total) by mouth daily.   90 capsule   3   . ferrous sulfate 325 (65 FE) MG tablet   Oral   Take 325 mg by mouth 3 (three) times daily.           . fexofenadine (ALLEGRA) 180 MG tablet   Oral   Take 180 mg by mouth daily.         . furosemide (LASIX) 20 MG tablet   Oral   Take 2 tablets (40 mg total) by mouth daily.   30 tablet   0   . isosorbide mononitrate (IMDUR) 60 MG 24 hr tablet   Oral   Take 60 mg by mouth daily.         Marland Kitchen lisinopril (PRINIVIL,ZESTRIL) 10 MG tablet   Oral   Take 10 mg by mouth daily.         . nitroGLYCERIN (NITROSTAT) 0.4 MG SL tablet   Sublingual   Place 1 tablet (0.4 mg total) under the tongue every 5 (five) minutes as needed for chest pain.   30 tablet   0   . ondansetron (ZOFRAN) 4 MG tablet   Oral   Take 1 tablet (4 mg total) by mouth every 8 (eight) hours as needed for nausea or vomiting.   40 tablet    1   . ranitidine (ZANTAC) 150 MG tablet  Oral   Take 1 tablet (150 mg total) by mouth at bedtime.   90 tablet   3   . risperiDONE (RISPERDAL) 0.5 MG tablet   Oral   Take 1 tablet (0.5 mg total) by mouth 2 (two) times daily.   90 tablet   1   . sertraline (ZOLOFT) 100 MG tablet   Oral   Take 1 tablet (100 mg total) by mouth daily.   90 tablet   3    BP 113/38  Pulse 78  Temp(Src) 97.6 F (36.4 C) (Oral)  Resp 20  SpO2 97% Physical Exam  Nursing note and vitals reviewed. Constitutional: He is oriented to person, place, and time. He appears well-developed and well-nourished. No distress.  HENT:  Head: Normocephalic and atraumatic.  Right Ear: External ear normal.  Left Ear: External ear normal.  Mouth/Throat: Oropharynx is clear and moist. No oropharyngeal exudate.  Eyes: Conjunctivae are normal. Pupils are equal, round, and reactive to light. Right eye exhibits no discharge.  Neck: Normal range of motion. Neck supple. No tracheal deviation present.  Cardiovascular: Normal rate, regular rhythm and intact distal pulses.   Pulmonary/Chest: Effort normal. No respiratory distress. He has no wheezes. He has no rales.  Abdominal: Soft. He exhibits no distension. There is no tenderness. There is no rebound and no guarding.  Musculoskeletal: Normal range of motion.  Neurological: He is alert and oriented to person, place, and time.  Skin: Skin is warm and dry. No rash noted. He is not diaphoretic.  Psychiatric: He has a normal mood and affect.    ED Course  Procedures (including critical care time) Labs Review Labs Reviewed  CBC WITH DIFFERENTIAL - Abnormal; Notable for the following:    WBC 11.1 (*)    RBC 3.10 (*)    Hemoglobin 9.8 (*)    HCT 29.1 (*)    Platelets 423 (*)    Neutrophils Relative % 80 (*)    Neutro Abs 8.9 (*)    All other components within normal limits  COMPREHENSIVE METABOLIC PANEL - Abnormal; Notable for the following:    Sodium 133 (*)     Potassium 3.2 (*)    Glucose, Bld 111 (*)    BUN 35 (*)    Creatinine, Ser 2.41 (*)    Total Protein 5.9 (*)    Albumin 2.5 (*)    Alkaline Phosphatase 176 (*)    Total Bilirubin 0.2 (*)    GFR calc non Af Amer 25 (*)    GFR calc Af Amer 28 (*)    All other components within normal limits  URINALYSIS, ROUTINE W REFLEX MICROSCOPIC - Abnormal; Notable for the following:    Bilirubin Urine SMALL (*)    All other components within normal limits  MRSA PCR SCREENING  CLOSTRIDIUM DIFFICILE BY PCR  STOOL CULTURE  LACTIC ACID, PLASMA  MAGNESIUM  PHOSPHORUS  TSH  COMPREHENSIVE METABOLIC PANEL  CBC  TROPONIN I  TROPONIN I  SODIUM, URINE, RANDOM  CREATININE, URINE, RANDOM   Imaging Review No results found.  EKG Interpretation    Date/Time:  Sunday July 06 2013 17:00:53 EST Ventricular Rate:  82 PR Interval:  109 QRS Duration: 184 QT Interval:  574 QTC Calculation: 671 R Axis:   -87 Text Interpretation:  Sinus rhythm Short PR interval RBBB and LAFB Probable left ventricular hypertrophy When compared with ECG of 06/21/2013, No significant change was found Confirmed by [facility]  MD, DAVID ([id number]) on 07/06/2013 5:09:58 PM  MDM   1. Dehydration   2. Acute renal failure   3. Atrial fibrillation   4. Hypotension     [name] is a 78 y.o. male with multiple heart problems and recent admission for norovirus that involved CPR who presents to the emergency department with continued N/V/D.  On exam, no abdominal pain noted, but patient puny appearance.  NS bolus administered.  Basic workup initiated and patient found to be dehydrated and with ARF.  Patient will require admission.  Not meeting SIRS criteria.  No indication for antibiotics at this time.  Patient with normal vital signs after 1 L of fluid.  Hospitalists consulted for admission.  Patient admitted.    Arloa Koh, MD 07/07/13 [phone]

## 2013-07-06 NOTE — ED Notes (Signed)
Patient transported to X-ray 

## 2013-07-07 ENCOUNTER — Inpatient Hospital Stay (HOSPITAL_COMMUNITY): Payer: Medicare Other

## 2013-07-07 LAB — COMPREHENSIVE METABOLIC PANEL
ALT: 11 U/L (ref 0–53)
AST: 14 U/L (ref 0–37)
Alkaline Phosphatase: 184 U/L — ABNORMAL HIGH (ref 39–117)
BUN: 29 mg/dL — ABNORMAL HIGH (ref 6–23)
CO2: 19 mEq/L (ref 19–32)
Calcium: 8.6 mg/dL (ref 8.4–10.5)
Chloride: 102 mEq/L (ref 96–112)
GFR calc non Af Amer: 35 mL/min — ABNORMAL LOW (ref >90)
Potassium: 3.4 mEq/L — ABNORMAL LOW (ref 3.5–5.1)
Sodium: 133 mEq/L — ABNORMAL LOW (ref 135–145)
Total Bilirubin: 0.2 mg/dL — ABNORMAL LOW (ref 0.3–1.2)
Total Protein: 5.7 g/dL — ABNORMAL LOW (ref 6.0–8.3)

## 2013-07-07 LAB — MRSA PCR SCREENING: MRSA by PCR: NEGATIVE

## 2013-07-07 LAB — CBC
HCT: 29.4 % — ABNORMAL LOW (ref 39.0–52.0)
Hemoglobin: 9.7 g/dL — ABNORMAL LOW (ref 13.0–17.0)
MCH: 31 pg (ref 26.0–34.0)
MCHC: 33 g/dL (ref 30.0–36.0)
Platelets: 399 10*3/uL (ref 150–400)

## 2013-07-07 LAB — CREATININE, URINE, RANDOM: Creatinine, Urine: 54.24 mg/dL

## 2013-07-07 LAB — TSH: TSH: 3.918 u[IU]/mL (ref 0.350–4.500)

## 2013-07-07 MED ORDER — ENOXAPARIN SODIUM 40 MG/0.4ML ~~LOC~~ SOLN
40.0000 mg | Freq: Every day | SUBCUTANEOUS | Status: DC
  Administered 2013-07-08: 40 mg via SUBCUTANEOUS
  Filled 2013-07-07 (×2): qty 0.4

## 2013-07-07 MED ORDER — BOOST / RESOURCE BREEZE PO LIQD
1.0000 | Freq: Two times a day (BID) | ORAL | Status: DC
  Administered 2013-07-07 – 2013-07-12 (×4): 1 via ORAL

## 2013-07-07 NOTE — Progress Notes (Addendum)
INITIAL NUTRITION ASSESSMENT  DOCUMENTATION CODES Per approved criteria  -Severe malnutrition in the context of chronic illness   INTERVENTION:  Resource Breeze twice daily (250 kcals, 9 gm protein per 8 fl oz carton) RD to follow for nutrition care plan  NUTRITION DIAGNOSIS: Inadequate oral intake related to poor appetite, N/V as evidenced by patient report  Goal: Pt to meet >/= 90% of their estimated nutrition needs   Monitor:  PO & supplemental intake, weight, labs, I/O's  Reason for Assessment: Malnutrition Screening Tool Report  78 y.o. male  Admitting Dx: nausea & poor PO intake  ASSESSMENT: Patient with PMH of COPD, CVA and CAD; previous admitted with enteritis (thought to be Norovirus); presented again with ongoing nausea, occasional vomiting and one loose BM per day; + severe fatigue and abdominal pain.  Patient followed per Nutritional Management with previous hospital admission beginning of November 2014; reports ongoing poor appetite with nausea and vomiting; patient with visible wasting & fat loss to upper body; DG Abdomen negative for significant obstruction or ileus; will order Resource Breeze supplements to maximize oral intake.  Nutrition Focused Physical Exam:   Subcutaneous Fat:  Orbital Region: moderate depletion  Upper Arm Region: severe depletion  Thoracic and Lumbar Region: n/a   Muscle:  Temple Region: moderate depletion  Clavicle Bone Region: severe depletion  Clavicle and Acromion Bone Region: severe depletion  Scapular Bone Region: moderate depletion  Dorsal Hand: n/a  Patellar Region: n/a  Anterior Thigh Region: n/a  Posterior Calf Region: n/a  Patient meets criteria for severe malnutrition in the context of chronic illness as evidenced by < 75% intake of estimated energy requirement for > 1 month and severe subcutaneous fat and muscle loss.  Height: Ht Readings from Last 1 Encounters:  07/06/13 6' (1.829 m)    Weight: Wt Readings  from Last 1 Encounters:  07/06/13 161 lb 6 oz (73.2 kg)    Ideal Body Weight: 178 lb  % Ideal Body Weight: 90%  Wt Readings from Last 10 Encounters:  07/06/13 161 lb 6 oz (73.2 kg)  07/02/13 151 lb (68.493 kg)  06/28/13 159 lb 9.8 oz (72.4 kg)  01/08/13 173 lb 12.8 oz (78.835 kg)  01/03/13 170 lb 8 oz (77.338 kg)  12/24/12 170 lb 8 oz (77.338 kg)  12/20/12 170 lb 8 oz (77.338 kg)  12/16/12 170 lb 9.6 oz (77.384 kg)  12/16/12 170 lb 9.6 oz (77.384 kg)  05/29/12 170 lb 9.6 oz (77.384 kg)    Usual Body Weight: 170 lb  % Usual Body Weight: 95%  BMI:  Body mass index is 21.88 kg/(m^2).  Estimated Nutritional Needs: Kcal: 1800-2000 Protein: 90-100 gm Fluid: 1.8-2.0 L  Skin: healing pressure ulcer to sacrum  Diet Order: Cardiac  EDUCATION NEEDS: -No education needs identified at this time   Intake/Output Summary (Last 24 hours) at 07/07/13 1216 Last data filed at 07/07/13 1000  Gross per 24 hour  Intake     12 ml  Output    400 ml  Net   -388 ml    Labs:   Recent Labs Lab 07/06/13 1800 07/07/13 0050  NA 133* 133*  K 3.2* 3.4*  CL 100 102  CO2 22 19  BUN 35* 29*  CREATININE 2.41* 1.80*  CALCIUM 8.7 8.6  MG  --  1.8  PHOS  --  4.1  GLUCOSE 111* 90    Scheduled Meds: . amiodarone  200 mg Oral Daily  . aspirin EC  81 mg Oral  Daily  . atorvastatin  40 mg Oral Daily  . carvedilol  3.125 mg Oral BID WC  . cilostazol  100 mg Oral BID  . clopidogrel  75 mg Oral Q breakfast  . enoxaparin (LOVENOX) injection  30 mg Subcutaneous Q24H  . famotidine  20 mg Oral QHS  . ferrous sulfate  325 mg Oral TID  . pantoprazole  40 mg Oral Daily  . risperiDONE  0.5 mg Oral BID  . sertraline  100 mg Oral Daily  . sodium chloride  3 mL Intravenous Q12H    Continuous Infusions:   Past Medical History  Diagnosis Date  . CEREBROVASCULAR ACCIDENT, HX OF 03/02/2007  . CHEST PAIN 01/31/2010  . CHRONIC OBSTRUCTIVE PULMONARY DISEASE, ACUTE EXACERBATION 02/18/2009  . COPD  03/02/2007  . CORONARY ARTERY DISEASE 03/02/2007  . DEPRESSION 09/23/2007    anxiety  . DISEASE, PERIPHERAL VASCULAR NEC 03/02/2007  . DIVERTICULOSIS, COLON 09/23/2007  . FATIGUE 01/22/2009  . GERD 03/02/2007  . GLUCOSE INTOLERANCE 09/23/2007  . HEMORRHOIDS 10/09/2007  . HIATAL HERNIA 10/09/2007  . HYPERCHOLESTEROLEMIA 10/09/2007  . HYPERTENSION 09/23/2007  . HYPOKALEMIA 10/09/2007  . MESENTERIC VASCULAR INSUFFICIENCY 07/06/2008  . MITRAL REGURGITATION 11/27/2008  . MI 10/09/2007  . Other specified forms of hearing loss 08/23/2010  . PERIPHERAL VASCULAR DISEASE 09/23/2007  . PUD 10/09/2007  . RENAL ARTERY STENOSIS 03/02/2007  . TOBACCO ABUSE 04/20/2010  . 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 Unspecified Iron Deficiency Anemia 09/23/2007  . URINARY RETENTION 01/22/2009  . VITAMIN B12 DEFICIENCY 01/03/2010  . WRIST PAIN, RIGHT 06/06/2010  . Impaired glucose tolerance 01/29/2011  . Hearing loss in left ear 01/30/2011  . Blindness of both eyes 01/30/2011  . Hyperlipidemia   . Anemia, iron deficiency   . Atrial flutter   . HTN (hypertension) 03/21/2011  . Peripheral arterial disease     Past Surgical History  Procedure Laterality Date  . Coronary artery bypass graft  1995    LIMA-LAD, SVG-OM, SVG-D, SVG-PDA  . Renal artery stenting  2004    bilateral  . S/p multiple le vascular bypass      includine fem-pop x 2 LLE  . S/p bilateral cea    . Coronary angioplasty with stent placement  2010    DES x 2 SVG-OM, cutting balloon PTCA SVG-PDA for ISR  . Appendectomy    . S/p lumbar disc surgury      x2  . S/p rle fem bypass feb 2011    . Cardiac catheterization    . Pr vein bypass graft,aorto-fem-pop    . Carotid endarterectomy    . Coronary angioplasty with stent placement  2011    DES to SVG-DIAG, SVG-PDA  . Esophagogastroduodenoscopy    . Colonoscopy      Maureen Chatters, RD, LDN Pager #: [phone] After-Hours Pager #: [phone]

## 2013-07-07 NOTE — Progress Notes (Signed)
[facility] PROGRESS NOTE      [name] ZOX:[id number] DOB: 1935/02/05 DOA: 07/06/2013 PCP: [name], MD  HPI: Last month patient was admitted with Enteritis though to be due to Norvirus. HE became severely dehydrated and went into VT and cardiac arrest. Patient REQUIRED INTUBATION and spend time in ICU . He was discharged to home last week but continued to have nausea occasional vomiting and at least one loose BM a day. Denies any fever or chills. He reported some abdominal pain but no chest pain. Family reports sacral decubitus. Patient has severe fatigue. Family declined PT/OT at home stating patient is too sick.   Assessment/Plan: Acute kidney injury - secondary to dehydration, give IVF, improving - [place] normal Dehydration - administer IVF hold laisx Ventricular tachycardia, paroxysmal - continue coreg and monitor on tele Hypotension - improving with IVF likely due to dehydration, lactic acid WNL Enteritis due to Norovirus - persistent diarrhea although improved. Will send for stool culture and c.diff if negative would try to start imodium or lomotil CORONARY ARTERY DISEASE - no chest pain but given hypotension will cycle CE. Atrial fibrillation with RVR - continue amiodarone Chronic systolic heart failure - daily weights.  Diet: clears Fluids: NS at 50 DVT Prophylaxis: Lovenox  Code Status: Full Family Communication: niece bedside  Disposition Plan: inpatient  Consultants:  none  Procedures:  none   Antibiotics  Anti-infectives   None     Antibiotics Given (last 72 hours)   None      HPI/Subjective: - feeling a bit better.  Objective: Filed Vitals:   07/07/13 0645 07/07/13 0804 07/07/13 0917 07/07/13 1228  BP: 93/30 104/38 117/46 118/39  Pulse: 88 86 85 86  Temp:  97.9 F (36.6 C)  97.8 F (36.6 C)  TempSrc:  Oral  Axillary  Resp: 12 21  21   Height:      Weight:      SpO2: 94% 95%  93%    Intake/Output Summary (Last 24 hours) at  07/07/13 1353 Last data filed at 07/07/13 1228  Gross per 24 hour  Intake     15 ml  Output    700 ml  Net   -685 ml   Filed Weights   07/06/13 2215  Weight: 73.2 kg (161 lb 6 oz)    Exam:   General:  NAD  Cardiovascular: regular rate and rhythm, without MRG  Respiratory: good air movement, clear to auscultation throughout, no wheezing, ronchi or rales  Abdomen: soft, not tender to palpation, positive bowel sounds  MSK: no peripheral edema  Neuro: CN 2-12 grossly intact, MS 5/5 in all 4  Data Reviewed: Basic Metabolic Panel:  Recent Labs Lab 07/06/13 1800 07/07/13 0050  NA 133* 133*  K 3.2* 3.4*  CL 100 102  CO2 22 19  GLUCOSE 111* 90  BUN 35* 29*  CREATININE 2.41* 1.80*  CALCIUM 8.7 8.6  MG  --  1.8  PHOS  --  4.1   Liver Function Tests:  Recent Labs Lab 07/06/13 1800 07/07/13 0050  AST 13 14  ALT 11 11  ALKPHOS 176* 184*  BILITOT 0.2* 0.2*  PROT 5.9* 5.7*  ALBUMIN 2.5* 2.4*    Recent Labs Lab 07/07/13 1200  LIPASE 21   No results found for this basename: AMMONIA,  in the last 168 hours CBC:  Recent Labs Lab 07/06/13 1800 07/07/13 0050  WBC 11.1* 12.4*  NEUTROABS 8.9*  --   HGB 9.8* 9.7*  HCT 29.1* 29.4*  MCV 93.9 93.9  PLT 423* 399   Cardiac Enzymes:  Recent Labs Lab 07/07/13 0050 07/07/13 0547 07/07/13 1200  TROPONINI <0.30 <0.30 <0.30   BNP (last 3 results)  Recent Labs  12/23/12 2045  PROBNP 1188.0*   CBG: No results found for this basename: GLUCAP,  in the last 168 hours  Recent Results (from the past 240 hour(s))  MRSA PCR SCREENING     Status: None   Collection Time    07/06/13 10:25 PM      Result Value Range Status   MRSA by PCR NEGATIVE  NEGATIVE Final   Comment:            The GeneXpert MRSA Assay (FDA     approved for NASAL specimens     only), is one component of a     comprehensive MRSA colonization     surveillance program. It is not     intended to diagnose MRSA     infection nor to guide or      monitor treatment for     MRSA infections.     Studies: Dg Chest 2 View  07/06/2013   CLINICAL DATA:  Epigastric pain for 1 week, low blood pressure  EXAM: CHEST  2 VIEW  COMPARISON:  06/25/2013  FINDINGS: Heart size mildly enlarged. Heavy aortic arch calcification. Vascular pattern normal. Mild diffuse interstitial change most prominent in the right middle lobe and left base. Unchanged appearance when compared to prior study, except for mild decrease in the overall prominence of interstitial change. Right internal jugular catheter has been removed.  IMPRESSION: Decreased interstitial conspicuity suggesting resolving interstitial pulmonary edema.   Electronically Signed   By: Esperanza Heir M.D.   On: 07/06/2013 18:50   Dg Abd 1 View  07/07/2013   CLINICAL DATA:  Increased abdominal pain, evaluate for ileus  EXAM: ABDOMEN - 1 VIEW  COMPARISON:  06/17/2013  FINDINGS: Mild nonspecific gas distention of the bowel. Negative for significant obstruction or ileus. Extensive aortoiliac calcifications noted. Bilateral renal and common iliac stents present. No acute osseous finding.  IMPRESSION: Negative for significant obstruction or ileus.   Electronically Signed   By: Ruel Favors M.D.   On: 07/07/2013 08:19   US Renal  07/07/2013   CLINICAL DATA:  Renal failure  EXAM: RENAL/URINARY TRACT ULTRASOUND COMPLETE  COMPARISON:  CT abdomen and pelvis 06/17/2013  FINDINGS: Right Kidney  Length: 10.3 cm. Normal cortical thickness and echogenicity for age. . 3 mm echogenic non shadowing focus mid right kidney corresponding the calculus on prior CT. No mass or hydronephrosis.  Left Kidney  Length: 11.5 cm. Normal cortical thickness and echogenicity for age. No mass, hydronephrosis or shadowing calcification.  Bladder  Appears normal for degree of bladder distention.  IMPRESSION: 3 mm nonobstructing mid right renal calculus.  Otherwise negative exam.   Electronically Signed   By: Ulyses Southward M.D.   On: 07/07/2013  08:48    Scheduled Meds: . amiodarone  200 mg Oral Daily  . aspirin EC  81 mg Oral Daily  . atorvastatin  40 mg Oral Daily  . carvedilol  3.125 mg Oral BID WC  . cilostazol  100 mg Oral BID  . clopidogrel  75 mg Oral Q breakfast  . [START ON 07/08/2013] enoxaparin (LOVENOX) injection  40 mg Subcutaneous Daily  . famotidine  20 mg Oral QHS  . feeding supplement (RESOURCE BREEZE)  1 Container Oral BID BM  . ferrous sulfate  325 mg Oral  TID  . pantoprazole  40 mg Oral Daily  . risperiDONE  0.5 mg Oral BID  . sertraline  100 mg Oral Daily  . sodium chloride  3 mL Intravenous Q12H   Continuous Infusions:   Active Problems:   CORONARY ARTERY DISEASE   Acute renal failure   Hypotension   Atrial fibrillation with RVR   Enteritis due to Norovirus   Ventricular tachycardia, paroxysmal   Dehydration   Time spent: 35  Pamella Pert, MD [facility] Pager [phone]. If 7 PM - 7 AM, please contact night-coverage at [email or web address], password [facility] 07/07/2013, 1:53 PM  LOS: 1 day

## 2013-07-08 ENCOUNTER — Inpatient Hospital Stay (HOSPITAL_COMMUNITY): Payer: Medicare Other

## 2013-07-08 ENCOUNTER — Encounter: Payer: Medicare Other | Admitting: Nurse Practitioner

## 2013-07-08 ENCOUNTER — Encounter (HOSPITAL_COMMUNITY): Payer: Self-pay | Admitting: Physician Assistant

## 2013-07-08 DIAGNOSIS — R1013 Epigastric pain: Secondary | ICD-10-CM

## 2013-07-08 DIAGNOSIS — K8020 Calculus of gallbladder without cholecystitis without obstruction: Secondary | ICD-10-CM

## 2013-07-08 DIAGNOSIS — R112 Nausea with vomiting, unspecified: Secondary | ICD-10-CM

## 2013-07-08 LAB — BASIC METABOLIC PANEL
BUN: 24 mg/dL — ABNORMAL HIGH (ref 6–23)
CO2: 22 mEq/L (ref 19–32)
Chloride: 102 mEq/L (ref 96–112)
Creatinine, Ser: 1.14 mg/dL (ref 0.50–1.35)
GFR calc non Af Amer: 61 mL/min — ABNORMAL LOW (ref >90)
Potassium: 3.7 mEq/L (ref 3.5–5.1)
Sodium: 134 mEq/L — ABNORMAL LOW (ref 135–145)

## 2013-07-08 LAB — CBC
HCT: 27.2 % — ABNORMAL LOW (ref 39.0–52.0)
MCV: 95.1 fL (ref 78.0–100.0)
Platelets: 373 10*3/uL (ref 150–400)
RBC: 2.86 MIL/uL — ABNORMAL LOW (ref 4.22–5.81)
RDW: 14.9 % (ref 11.5–15.5)
WBC: 13.2 10*3/uL — ABNORMAL HIGH (ref 4.0–10.5)

## 2013-07-08 LAB — OCCULT BLOOD X 1 CARD TO LAB, STOOL: Fecal Occult Bld: POSITIVE — AB

## 2013-07-08 MED ORDER — SODIUM CHLORIDE 0.9 % IV SOLN
INTRAVENOUS | Status: AC
  Administered 2013-07-08: 09:00:00 via INTRAVENOUS

## 2013-07-08 NOTE — Consult Note (Addendum)
[facility] Consult: 12:57 PM 07/08/2013  LOS: 2 days    Referring Provider: Dr Elvera Lennox. Primary Care Physician:  [name], MD Primary Gastroenterologist:  Dr. Leone Payor    Reason for Consultation:  Nausea and vomiting.    HPI: [name] is a 78 y.o. male.  Multiple medical problems. Significant vascular pathology and vascular stenting history.   Admission 11/3 - 06/28/13 with diarrhea from Norvirus (GI pathogen panel + for this). Treated for resp failure/volume overload, ARF, metabolic acidosis, electrolyte depletion.    Oral magnesium had recently been added and was discontinued.  Transaminases elevated during admission.   He was tolerating limited pos near discharge but nausea has been recurrent at home if he tries to eat solids.  He vomits and is unable to keep down solids.  No bloody or dark emesis. Takes chronic daily Nexium and HS Zantac. Also on chronic po Iron, Plavix, 81 ASA, pletal. Before recent illness was eating well and had no GI issues, no dysphagia or dypepsia.  Stools down from up to 8 daily, now one per day which is soft to loose and unformed.  Mild epigastric abdominal pain.    On CT scan of 06/17/13 had stones in GB. No signs of cholecystitis. Also has extensive vascular calcifications of aorta.     Past Medical History  Diagnosis Date  . CEREBROVASCULAR ACCIDENT, HX OF 03/02/2007  . CHEST PAIN 01/31/2010  . CHRONIC OBSTRUCTIVE PULMONARY DISEASE, ACUTE EXACERBATION 02/18/2009  . COPD 03/02/2007  . CORONARY ARTERY DISEASE 03/02/2007  . DEPRESSION 09/23/2007    anxiety  . DISEASE, PERIPHERAL VASCULAR NEC 03/02/2007  . DIVERTICULOSIS, COLON 09/23/2007  . FATIGUE 01/22/2009  . GERD 03/02/2007  . GLUCOSE INTOLERANCE 09/23/2007  . HEMORRHOIDS 10/09/2007  . HIATAL HERNIA 10/09/2007  .  HYPERCHOLESTEROLEMIA 10/09/2007  . HYPERTENSION 09/23/2007  . HYPOKALEMIA 10/09/2007  . MESENTERIC VASCULAR INSUFFICIENCY 07/06/2008  . MITRAL REGURGITATION 11/27/2008  . MI 10/09/2007  . PERIPHERAL VASCULAR DISEASE 09/23/2007  . PUD 10/09/2007  . RENAL ARTERY STENOSIS 03/02/2007  . TOBACCO ABUSE 04/20/2010  . 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 URINARY RETENTION 01/22/2009  . VITAMIN B12 DEFICIENCY 01/03/2010  . WRIST PAIN, RIGHT 06/06/2010  . Impaired glucose tolerance 01/29/2011  . Hearing loss in left ear 01/30/2011  . Blindness of both eyes 01/30/2011  . Hyperlipidemia   . Anemia, iron deficiency   . Atrial flutter   . HTN (hypertension) 03/21/2011  . AVM (arteriovenous malformation) 09/2010    stomach and duodenum  . PUD (peptic ulcer disease) 09/2010    pyloric ulcer    Past Surgical History  Procedure Laterality Date  . Coronary artery bypass graft  1995    LIMA-LAD, SVG-OM, SVG-D, SVG-PDA  . Renal artery stenting  2004    bilateral  . S/p multiple le vascular bypass      includine fem-pop x 2 LLE  . S/p bilateral cea    . Coronary angioplasty with stent placement  2010    DES x 2 SVG-OM, cutting balloon PTCA SVG-PDA for ISR  .  Appendectomy    . S/p lumbar disc surgury      x2  . S/p rle fem bypass feb 2011    . Cardiac catheterization    . Pr vein bypass graft,aorto-fem-pop    . Carotid endarterectomy    . Coronary angioplasty with stent placement  2011    DES to SVG-DIAG, SVG-PDA  . Esophagogastroduodenoscopy    . Colonoscopy      Prior to Admission medications   Medication Sig Start Date End Date Taking? Authorizing Provider  acetaminophen (TYLENOL) 500 MG tablet Take 500 mg by mouth every 6 (six) hours as needed.   Yes Historical Provider, MD  amiodarone (PACERONE) 400 MG tablet Take 0.5 tablets (200 mg total) by mouth daily. 06/28/13 07/06/13 Yes Marinda Elk, MD  amLODipine (NORVASC) 10 MG tablet Take 10 mg by mouth daily.   Yes Historical Provider,  MD  aspirin 81 MG EC tablet Take 81 mg by mouth daily.     Yes Historical Provider, MD  atorvastatin (LIPITOR) 40 MG tablet Take 40 mg by mouth daily.   Yes Historical Provider, MD  carvedilol (COREG) 3.125 MG tablet Take 1 tablet (3.125 mg total) by mouth 2 (two) times daily with a meal. 06/28/13  Yes Marinda Elk, MD  cilostazol (PLETAL) 100 MG tablet Take 100 mg by mouth 2 (two) times daily.   Yes Historical Provider, MD  clopidogrel (PLAVIX) 75 MG tablet Take 75 mg by mouth daily.   Yes Historical Provider, MD  esomeprazole (NEXIUM) 40 MG capsule Take 1 capsule (40 mg total) by mouth daily. 12/20/12  Yes Corwin Levins, MD  ferrous sulfate 325 (65 FE) MG tablet Take 325 mg by mouth 3 (three) times daily.     Yes Historical Provider, MD  furosemide (LASIX) 20 MG tablet Take 2 tablets (40 mg total) by mouth daily. 06/28/13  Yes Marinda Elk, MD  isosorbide mononitrate (IMDUR) 60 MG 24 hr tablet Take 60 mg by mouth daily.   Yes Historical Provider, MD  lisinopril (PRINIVIL,ZESTRIL) 10 MG tablet Take 10 mg by mouth daily.   Yes Historical Provider, MD  ondansetron (ZOFRAN) 4 MG tablet Take 1 tablet (4 mg total) by mouth every 8 (eight) hours as needed for nausea or vomiting. 07/04/13  Yes Corwin Levins, MD  ranitidine (ZANTAC) 150 MG tablet Take 1 tablet (150 mg total) by mouth at bedtime. 01/03/13  Yes Corwin Levins, MD  risperiDONE (RISPERDAL) 0.5 MG tablet Take 1 tablet (0.5 mg total) by mouth 2 (two) times daily. 06/10/13  Yes Corwin Levins, MD  sertraline (ZOLOFT) 100 MG tablet Take 1 tablet (100 mg total) by mouth daily. 01/03/13  Yes Corwin Levins, MD  nitroGLYCERIN (NITROSTAT) 0.4 MG SL tablet Place 1 tablet (0.4 mg total) under the tongue every 5 (five) minutes as needed for chest pain. 12/17/12   Rhetta Mura, MD    Scheduled Meds: . amiodarone  200 mg Oral Daily  . aspirin EC  81 mg Oral Daily  . atorvastatin  40 mg Oral Daily  . carvedilol  3.125 mg Oral BID WC  .  cilostazol  100 mg Oral BID  . clopidogrel  75 mg Oral Q breakfast  . enoxaparin (LOVENOX) injection  40 mg Subcutaneous Daily  . famotidine  20 mg Oral QHS  . feeding supplement (RESOURCE BREEZE)  1 Container Oral BID BM  . ferrous sulfate  325 mg Oral TID  . pantoprazole  40 mg Oral  Daily  . risperiDONE  0.5 mg Oral BID  . sertraline  100 mg Oral Daily  . sodium chloride  3 mL Intravenous Q12H   Infusions: . sodium chloride 50 mL/hr at 07/08/13 0916   PRN Meds: acetaminophen, acetaminophen, albuterol, HYDROcodone-acetaminophen, ondansetron (ZOFRAN) IV, ondansetron   Allergies as of 07/06/2013  . (No Known Allergies)    Family History  Problem Relation Age of Onset  . Colon cancer Mother 38  . Heart disease Father   . Cancer Father   . Heart disease Sister   . Prostate cancer Brother     prostate cancer  . Stroke Sister     History   Social History  . Marital Status: Married    Spouse Name: N/A    Number of Children: 2  . Years of Education: N/A   Occupational History  . 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 Smoking status: Current Every Day Smoker -- 1.00 packs/day for 60 years    Types: Cigarettes  . Smokeless tobacco: Never Used     Comment: 1 ppd  . Alcohol Use: No  . Drug Use: No  . Sexual Activity: Not on file   Other Topics Concern  . Not on file   Social History Narrative   Daily caffeine use 1/2 decaf 1/2 caffeine coffee a day    REVIEW OF SYSTEMS: Constitutional:  No falls, + weakness ENT:  No nose bleeds Pulm:  No new SOB.  + non-productive cough CV:  No palpitation, edema or chest pain.  GU:  No dysuria, no hematuria, no oliguria GI:  Per HPI Heme:  On chronic Iron.    Transfusions:  In past Neuro:  No headaches, not dizzy, no falls.  No gait problems Derm:  No rash or sores, no itching Endocrine:  No sweats, no chills, no polydipsia Immunization:  Up to date on vaccinces Travel:   None.    PHYSICAL EXAM: Vital signs in last 24 hours: Filed Vitals:   07/08/13 1136  BP: 101/37  Pulse: 96  Temp: 101 F (38.3 C)  Resp: 17   Wt Readings from Last 3 Encounters:  07/08/13 73.2 kg (161 lb 6 oz)  07/02/13 68.493 kg (151 lb)  06/28/13 72.4 kg (159 lb 9.8 oz)   General: frail, thin, alert and comfortable aged WM.  Looks old for age Head:  No trauma, no swelling  Eyes:  No icterus or pallor Ears:  Not HOH  Nose:  No discharge or bleeding Mouth:  Moist, clear, edentulous Neck:  No masses Lungs:  Clear bil.  No dyspnea Heart: RRR with 1/6 murmer.  Abdomen:  Soft, ND, mild epigastric tenderness.  No mass, no HSM.   Rectal: not done   Musc/Skeltl: no joint swelling Extremities:  No CCE  Neurologic:  Oriented x 3.  No tremor.  No gross deficits Skin:  No telangectasia Tattoos:  none Nodes:  No cervical adenopathy.    Psych:  Cooperative, relaxed.   Intake/Output from previous day: 11/24 0701 - 11/25 0700 In: 15 [P.O.:12; I.V.:3] Out: 1400 [Urine:1400] Intake/Output this shift: Total I/O In: -  Out: 550 [Urine:550]  LAB RESULTS:  Recent Labs  07/06/13 1800 07/07/13 0050 07/08/13 0400  WBC 11.1* 12.4* 13.2*  HGB 9.8* 9.7* 8.8*  HCT 29.1* 29.4* 27.2*  PLT 423* 399 373   BMET Lab Results  Component Value Date   NA 134* 07/08/2013   NA 133*  07/07/2013   NA 133* 07/06/2013   K 3.7 07/08/2013   K 3.4* 07/07/2013   K 3.2* 07/06/2013   CL 102 07/08/2013   CL 102 07/07/2013   CL 100 07/06/2013   CO2 22 07/08/2013   CO2 19 07/07/2013   CO2 22 07/06/2013   GLUCOSE 69* 07/08/2013   GLUCOSE 90 07/07/2013   GLUCOSE 111* 07/06/2013   BUN 24* 07/08/2013   BUN 29* 07/07/2013   BUN 35* 07/06/2013   CREATININE 1.14 07/08/2013   CREATININE 1.80* 07/07/2013   CREATININE 2.41* 07/06/2013   CALCIUM 8.5 07/08/2013   CALCIUM 8.6 07/07/2013   CALCIUM 8.7 07/06/2013   LFT  Recent Labs  07/06/13 1800 07/07/13 0050  PROT 5.9* 5.7*  ALBUMIN 2.5*  2.4*  AST 13 14  ALT 11 11  ALKPHOS 176* 184*  BILITOT 0.2* 0.2*   PT/INR Lab Results  Component Value Date   INR 0.89 12/16/2012   INR 0.90 09/23/2010   INR 1.01 12/30/2009   Hepatitis Panel No results found for this basename: HEPBSAG, HCVAB, HEPAIGM, HEPBIGM,  in the last 72 hours C-Diff No components found with this basename: cdiff   Lipase     Component Value Date/Time   LIPASE 21 07/07/2013 1200    RADIOLOGY STUDIES: Dg Chest 2 View  07/06/2013   CLINICAL DATA:  Epigastric pain for 1 week, low blood pressure  EXAM: CHEST  2 VIEW  COMPARISON:  06/25/2013  FINDINGS: Heart size mildly enlarged. Heavy aortic arch calcification. Vascular pattern normal. Mild diffuse interstitial change most prominent in the right middle lobe and left base. Unchanged appearance when compared to prior study, except for mild decrease in the overall prominence of interstitial change. Right internal jugular catheter has been removed.  IMPRESSION: Decreased interstitial conspicuity suggesting resolving interstitial pulmonary edema.   Electronically Signed   By: Esperanza Heir M.D.   On: 07/06/2013 18:50   Dg Abd 1 View  07/07/2013   CLINICAL DATA:  Increased abdominal pain, evaluate for ileus  EXAM: ABDOMEN - 1 VIEW  COMPARISON:  06/17/2013  FINDINGS: Mild nonspecific gas distention of the bowel. Negative for significant obstruction or ileus. Extensive aortoiliac calcifications noted. Bilateral renal and common iliac stents present. No acute osseous finding.  IMPRESSION: Negative for significant obstruction or ileus.   Electronically Signed   By: Ruel Favors M.D.   On: 07/07/2013 08:19   US Renal  07/07/2013   CLINICAL DATA:  Renal failure  EXAM: RENAL/URINARY TRACT ULTRASOUND COMPLETE  COMPARISON:  CT abdomen and pelvis 06/17/2013  FINDINGS: Right Kidney  Length: 10.3 cm. Normal cortical thickness and echogenicity for age. . 3 mm echogenic non shadowing focus mid right kidney corresponding the calculus  on prior CT. No mass or hydronephrosis.  Left Kidney  Length: 11.5 cm. Normal cortical thickness and echogenicity for age. No mass, hydronephrosis or shadowing calcification.  Bladder  Appears normal for degree of bladder distention.  IMPRESSION: 3 mm nonobstructing mid right renal calculus.  Otherwise negative exam.   Electronically Signed   By: Ulyses Southward M.D.   On: 07/07/2013 08:48    ENDOSCOPIC STUDIES: 09/2010  EGD for gI bleeding  Dr Caro Hight ablation of gastric and D2 AVMs.  Small pyloric ulcer.  Atrophic gastric mucosa  02/2010  EGD  For iron def anemia and B 12 deficiency  Dr Leone Payor ENDOSCOPIC IMPRESSION:  1) Moderate gastritis in the body and the antrum of the stomach  - biopsied  2) Otherwise normal examination  RECOMMENDATIONS:  1) Await biopsy results  colonoscopy is next  Colonoscopy  02/2010 ENDOSCOPIC IMPRESSION:  1) Two small polyps removed  2) Moderate diverticulosis in the sigmoid colon  3) Internal hemorrhoids  4) Otherwise normal examination, good prep  5) Personal history of 4 adenomas (2 tubulovillous and 1 large  with high-grade dysplasia) removal in 2008  RECOMMENDATIONS:  resume Plavix and continue other medications  Keep follow-up with Dr. Jonny Ruiz re: anemia   Pathology 02/2010 1. STOMACH, BIOPSY, : - CHRONIC GASTRITIS WITH MILD INCREASE IN EOSINOPHILS. - THERE IS NO EVIDENCE OF HELICOBACTER PYLORI, INTESTINAL METAPLASIA, DYSPLASIA, OR MALIGNANCY. - SEE COMMENT. 2. COLON, POLYP(S), TRANSVERSE, RECTUM : - TUBULAR ADENOMA. - HIGH GRADE DYSPLASIA IS NOT IDENTIFIED.  EGD and colonoscopy  in 2008   IMPRESSION:   *  N/V with solid foods in pt with recent norvirus and history PUD.  Diarrheal aspect of infection is much improved but not yet returmed to baseline.   *  Hx PUD  *  Hx gastric and duodenal AVMs associated with blood loss and anemia.  *  Gallstones without complications on CT scan 3 weeks ago. Minor elevation of AST, Alk phos then, now just  has elevated alk phos.  No abdominal pain.   *  Anemia.  Hgb baseline around 12.0. FOB positive on 06/18/2011. On chronic po Iron.   *  Extensive vascular disease, s/p stentings.  On Pletal and Plavix.   *  Adenomatous colon polyp in 2011   PLAN:     *  EGD tomorrow *  HIDA today   *  Dr Russella Dar to see pt *  Note stool C diff and cultures orderd.    Jennye Moccasin  07/08/2013, 12:57 PM Pager: [phone]     Attending physician's note   I have taken a history, examined the patient and reviewed the chart. I agree with the Advanced Practitioner's note, impression and recommendations. Refratory N/V and mild epigastric pain. R/O ulcer, gastritis, esophagitis, symptomatic cholelithiasis and post infectious gastroparesis. Hold Fe. HIDA today. EGD tomorrow. Clear liquids as tolerated for now.   Meryl Dare, MD [facility]

## 2013-07-08 NOTE — Progress Notes (Signed)
[facility] PROGRESS NOTE      [name] YQM:[id number] DOB: 21-Sep-1934 DOA: 07/06/2013 PCP: [name], MD  HPI: Last month patient was admitted with Enteritis though to be due to Norvirus. HE became severely dehydrated and went into VT and cardiac arrest. Patient REQUIRED INTUBATION and spend time in ICU . He was discharged to home last week but continued to have nausea occasional vomiting and at least one loose BM a day. Denies any fever or chills. He reported some abdominal pain but no chest pain. Family reports sacral decubitus. Patient has severe fatigue. Family declined PT/OT at home stating patient is too sick.   Assessment/Plan: Acute kidney injury - secondary to dehydration, give IVF, improving. His IV fluids were discontinued last night, and his diet was advanced heart healthy, however he was unable to tolerate any by mouth intake. He endorses a mild episode of vomiting last. - [place] normal Dehydration - additional 500 cc of normal saline today over 10 hours given poor by mouth intake Ventricular tachycardia, paroxysmal - continue coreg and monitor on tele Hypotension - improving with IVF likely due to dehydration, lactic acid WNL - Additional 500 cc bolus today. Enteritis due to Norovirus - persistent diarrhea although improved. Will send for stool culture and c.diff if negative would try to start imodium or lomotil - Patient with persistent symptoms today, consulted to gastroenterology, appreciate input. - Patient with prior history of AVMs, last endoscopy was in 2012, FOBT positive during last hospitalization however that was in the setting of enteritis. Repeat FOBT here. Hemoglobin this morning 8.8 from 9.7 yesterday, however patient got hydration. - C. difficile pending. CORONARY ARTERY DISEASE - no chest pain, cardiac enzymes negative Atrial fibrillation with RVR - continue amiodarone Chronic systolic heart failure - daily weights. Weight stable.  Diet:  clears Fluids: NS at 50 for 10 hours DVT Prophylaxis: Lovenox  Code Status: Full Family Communication: niece bedside  Disposition Plan: inpatient  Consultants:  GI  Procedures:  none   Antibiotics  Anti-infectives   None     Antibiotics Given (last 72 hours)   None      HPI/Subjective: - continues to have nausea/vomiting. 1 diarrheal episode last night, did not save for the nurse.  Objective: Filed Vitals:   07/08/13 0000 07/08/13 0400 07/08/13 0415 07/08/13 0838  BP: 99/40 87/43 95/45  117/35  Pulse: 81 84  95  Temp: 97.6 F (36.4 C) 97.9 F (36.6 C)  98.2 F (36.8 C)  TempSrc: Oral Oral  Oral  Resp:  13  17  Height:      Weight:  73.2 kg (161 lb 6 oz)    SpO2: 96% 92%  99%    Intake/Output Summary (Last 24 hours) at 07/08/13 0948 Last data filed at 07/08/13 0414  Gross per 24 hour  Intake     15 ml  Output   1000 ml  Net   -985 ml   Filed Weights   07/06/13 2215 07/08/13 0400  Weight: 73.2 kg (161 lb 6 oz) 73.2 kg (161 lb 6 oz)    Exam:   General:  NAD  Cardiovascular: regular rate and rhythm, without MRG  Respiratory: good air movement, clear to auscultation throughout, no wheezing, ronchi or rales  Abdomen: soft, not tender to palpation, positive bowel sounds  MSK: no peripheral edema  Neuro: CN 2-12 grossly intact, MS 5/5 in all 4  Data Reviewed: Basic Metabolic Panel:  Recent Labs Lab 07/06/13 1800 07/07/13 0050 07/08/13 0400  NA 133* 133* 134*  K 3.2* 3.4* 3.7  CL 100 102 102  CO2 22 19 22   GLUCOSE 111* 90 69*  BUN 35* 29* 24*  CREATININE 2.41* 1.80* 1.14  CALCIUM 8.7 8.6 8.5  MG  --  1.8  --   PHOS  --  4.1  --    Liver Function Tests:  Recent Labs Lab 07/06/13 1800 07/07/13 0050  AST 13 14  ALT 11 11  ALKPHOS 176* 184*  BILITOT 0.2* 0.2*  PROT 5.9* 5.7*  ALBUMIN 2.5* 2.4*    Recent Labs Lab 07/07/13 1200  LIPASE 21   No results found for this basename: AMMONIA,  in the last 168  hours CBC:  Recent Labs Lab 07/06/13 1800 07/07/13 0050 07/08/13 0400  WBC 11.1* 12.4* 13.2*  NEUTROABS 8.9*  --   --   HGB 9.8* 9.7* 8.8*  HCT 29.1* 29.4* 27.2*  MCV 93.9 93.9 95.1  PLT 423* 399 373   Cardiac Enzymes:  Recent Labs Lab 07/07/13 0050 07/07/13 0547 07/07/13 1200  TROPONINI <0.30 <0.30 <0.30   BNP (last 3 results)  Recent Labs  12/23/12 2045  PROBNP 1188.0*   CBG: No results found for this basename: GLUCAP,  in the last 168 hours  Recent Results (from the past 240 hour(s))  MRSA PCR SCREENING     Status: None   Collection Time    07/06/13 10:25 PM      Result Value Range Status   MRSA by PCR NEGATIVE  NEGATIVE Final   Comment:            The GeneXpert MRSA Assay (FDA     approved for NASAL specimens     only), is one component of a     comprehensive MRSA colonization     surveillance program. It is not     intended to diagnose MRSA     infection nor to guide or     monitor treatment for     MRSA infections.     Studies: Dg Chest 2 View  07/06/2013   CLINICAL DATA:  Epigastric pain for 1 week, low blood pressure  EXAM: CHEST  2 VIEW  COMPARISON:  06/25/2013  FINDINGS: Heart size mildly enlarged. Heavy aortic arch calcification. Vascular pattern normal. Mild diffuse interstitial change most prominent in the right middle lobe and left base. Unchanged appearance when compared to prior study, except for mild decrease in the overall prominence of interstitial change. Right internal jugular catheter has been removed.  IMPRESSION: Decreased interstitial conspicuity suggesting resolving interstitial pulmonary edema.   Electronically Signed   By: Esperanza Heir M.D.   On: 07/06/2013 18:50   Dg Abd 1 View  07/07/2013   CLINICAL DATA:  Increased abdominal pain, evaluate for ileus  EXAM: ABDOMEN - 1 VIEW  COMPARISON:  06/17/2013  FINDINGS: Mild nonspecific gas distention of the bowel. Negative for significant obstruction or ileus. Extensive aortoiliac  calcifications noted. Bilateral renal and common iliac stents present. No acute osseous finding.  IMPRESSION: Negative for significant obstruction or ileus.   Electronically Signed   By: Ruel Favors M.D.   On: 07/07/2013 08:19   US Renal  07/07/2013   CLINICAL DATA:  Renal failure  EXAM: RENAL/URINARY TRACT ULTRASOUND COMPLETE  COMPARISON:  CT abdomen and pelvis 06/17/2013  FINDINGS: Right Kidney  Length: 10.3 cm. Normal cortical thickness and echogenicity for age. . 3 mm echogenic non shadowing focus mid right kidney corresponding the calculus on prior CT. No  mass or hydronephrosis.  Left Kidney  Length: 11.5 cm. Normal cortical thickness and echogenicity for age. No mass, hydronephrosis or shadowing calcification.  Bladder  Appears normal for degree of bladder distention.  IMPRESSION: 3 mm nonobstructing mid right renal calculus.  Otherwise negative exam.   Electronically Signed   By: Ulyses Southward M.D.   On: 07/07/2013 08:48    Scheduled Meds: . amiodarone  200 mg Oral Daily  . aspirin EC  81 mg Oral Daily  . atorvastatin  40 mg Oral Daily  . carvedilol  3.125 mg Oral BID WC  . cilostazol  100 mg Oral BID  . clopidogrel  75 mg Oral Q breakfast  . enoxaparin (LOVENOX) injection  40 mg Subcutaneous Daily  . famotidine  20 mg Oral QHS  . feeding supplement (RESOURCE BREEZE)  1 Container Oral BID BM  . ferrous sulfate  325 mg Oral TID  . pantoprazole  40 mg Oral Daily  . risperiDONE  0.5 mg Oral BID  . sertraline  100 mg Oral Daily  . sodium chloride  3 mL Intravenous Q12H   Continuous Infusions: . sodium chloride 50 mL/hr at 07/08/13 1610    Active Problems:   CORONARY ARTERY DISEASE   Acute renal failure   Hypotension   Atrial fibrillation with RVR   Enteritis due to Norovirus   Ventricular tachycardia, paroxysmal   Dehydration  Time spent: 25  Pamella Pert, MD [facility] Pager [phone]. If 7 PM - 7 AM, please contact night-coverage at [email or web address], password  [facility] Cl[facility] 07/08/2013, 9:48 AM  LOS: 2 days

## 2013-07-08 NOTE — Evaluation (Signed)
Physical Therapy Evaluation Patient Details Name: [name] MRN: [id number] DOB: October 12, 1934 Today's Date: 07/08/2013 Time: 2956-2130 PT Time Calculation (min): 18 min  PT Assessment / Plan / Recommendation History of Present Illness    Last month patient was admitted with Enteritis though to be due to Norvirus. HE became severely dehydrated and went into VT and cardiac arrest. Patient REQUIRED INTUBATION and spend time in ICU . He was discharged to home last week but continued to have nausea occasional vomiting and at least one loose BM a day. Denies any fever or chills. He reported some abdominal pain but no chest pain. Family reports sacral decubitus. Patient has severe fatigue. Family declined PT/OT at home stating patient is too sick.      Clinical Impression  Pt admitted with above. Pt currently with functional limitations due to the deficits listed below (see PT Problem List). Pt will have 24 hour care at home per granddaughter.   Pt will benefit from skilled PT to increase their independence and safety with mobility to allow discharge to the venue listed below.     PT Assessment  Patient needs continued PT services    Follow Up Recommendations  Home health PT;Supervision/Assistance - 24 hour                Equipment Recommendations  None recommended by PT         Frequency Min 3X/week    Precautions / Restrictions Precautions Precautions: Fall Restrictions Weight Bearing Restrictions: No   Pertinent Vitals/Pain VSS with O2 sats >93% throughout, No pain      Mobility  Bed Mobility Bed Mobility: Supine to Sit;Sitting - Scoot to Edge of Bed;Sit to Supine Rolling Right: 5: Supervision Right Sidelying to Sit: 4: Min assist;HOB flat;With rails Supine to Sit: Not tested (comment) Sitting - Scoot to Edge of Bed: 4: Min guard Sit to Supine: Not Tested (comment) Scooting to [facility]: Not tested (comment) Details for Bed Mobility Assistance: cueing for safety and  sequence Transfers Transfers: Sit to Stand;Stand to Sit Sit to Stand: 4: Min assist;With upper extremity assist;From bed Stand to Sit: 4: Min assist;With upper extremity assist;To bed Details for Transfer Assistance: Assist to return to sitting safely due to fatigue Ambulation/Gait Ambulation/Gait Assistance: 1: +2 Total assist Ambulation/Gait: Patient Percentage: 80% Ambulation Distance (Feet): 175 Feet Assistive device: 2 person hand held assist Ambulation/Gait Assistance Details: cues for posture only.  Needed HHA for safety.  O2 sats 93% and greater throughout.   Gait Pattern: Step-through pattern;Decreased stride length;Trunk flexed Gait velocity: decreased Stairs: No Wheelchair Mobility Wheelchair Mobility: No    Exercises General Exercises - Lower Extremity Ankle Circles/Pumps: AROM;Seated;Both;20 reps Long Arc Quad: AROM;Seated;Both;15 reps Hip ABduction/ADduction: AROM;Seated;Both;15 reps Hip Flexion/Marching: AROM;Seated;Both;15 reps   PT Diagnosis: Difficulty walking;Generalized weakness  PT Problem List: Decreased strength;Decreased activity tolerance;Decreased balance;Decreased mobility;Decreased knowledge of use of DME;Cardiopulmonary status limiting activity PT Treatment Interventions: DME instruction;Gait training;Functional mobility training;Therapeutic activities;Therapeutic exercise;Balance training;Patient/family education;Stair training     PT Goals(Current goals can be found in the care plan section) Acute Rehab PT Goals Patient Stated Goal: to go home today. PT Goal Formulation: With patient Time For Goal Achievement: 07/15/13 Potential to Achieve Goals: Good  Visit Information  Last PT Received On: 07/08/13 Assistance Needed: +1       Prior Functioning  Home Living Family/patient expects to be discharged to:: Private residence Living Arrangements: Spouse/significant other Available Help at Discharge: Family;Available 24 hours/day Type of Home:  House Home Access: Stairs  to enter Entrance Stairs-Number of Steps: 1 Entrance Stairs-Rails: None Home Layout: One level Home Equipment: Walker - 2 wheels;Wheelchair - power;Tub bench Prior Function Level of Independence: Independent with assistive device(s) Comments: Dtr reports that pt with limited activity.  He performed self care activities, but only walked from bedroom to kitchen to bathroom Communication Communication: No difficulties Dominant Hand: Right    Cognition  Cognition Arousal/Alertness: Awake/alert Behavior During Therapy: WFL for tasks assessed/performed Overall Cognitive Status: Within Functional Limits for tasks assessed    Extremity/Trunk Assessment Upper Extremity Assessment Upper Extremity Assessment: Defer to OT evaluation Lower Extremity Assessment Lower Extremity Assessment: Generalized weakness Cervical / Trunk Assessment Cervical / Trunk Assessment: Normal   Balance Static Sitting Balance Static Sitting - Balance Support: No upper extremity supported;Feet supported Static Sitting - Level of Assistance: 5: Stand by assistance Static Sitting - Comment/# of Minutes: 3  End of Session PT - End of Session Equipment Utilized During Treatment: Gait belt;Oxygen Activity Tolerance: Patient limited by fatigue Patient left: in chair;with call bell/phone within reach;with family/visitor present Nurse Communication: Mobility status       INGOLD,Aubryana Vittorio 07/08/2013, 11:55 AM [facility] [phone] (pager)

## 2013-07-09 ENCOUNTER — Encounter (HOSPITAL_COMMUNITY): Payer: Self-pay

## 2013-07-09 ENCOUNTER — Encounter (HOSPITAL_COMMUNITY)
Admission: EM | Disposition: A | Discharge status: Home Health | Payer: Self-pay | Source: Home / Self Care | Attending: Internal Medicine

## 2013-07-09 DIAGNOSIS — R1013 Epigastric pain: Secondary | ICD-10-CM

## 2013-07-09 DIAGNOSIS — K259 Gastric ulcer, unspecified as acute or chronic, without hemorrhage or perforation: Secondary | ICD-10-CM

## 2013-07-09 HISTORY — PX: ESOPHAGOGASTRODUODENOSCOPY: SHX5428

## 2013-07-09 LAB — BASIC METABOLIC PANEL
CO2: 23 mEq/L (ref 19–32)
Creatinine, Ser: 1.14 mg/dL (ref 0.50–1.35)
Glucose, Bld: 76 mg/dL (ref 70–99)
Potassium: 3.6 mEq/L (ref 3.5–5.1)
Sodium: 135 mEq/L (ref 135–145)

## 2013-07-09 LAB — HEPATIC FUNCTION PANEL
ALT: 9 U/L (ref 0–53)
AST: 14 U/L (ref 0–37)
Albumin: 2.3 g/dL — ABNORMAL LOW (ref 3.5–5.2)
Alkaline Phosphatase: 175 U/L — ABNORMAL HIGH (ref 39–117)
Total Protein: 5.4 g/dL — ABNORMAL LOW (ref 6.0–8.3)

## 2013-07-09 LAB — FECAL LACTOFERRIN, QUANT

## 2013-07-09 LAB — CBC
HCT: 24.8 % — ABNORMAL LOW (ref 39.0–52.0)
Hemoglobin: 8.2 g/dL — ABNORMAL LOW (ref 13.0–17.0)
RBC: 2.61 MIL/uL — ABNORMAL LOW (ref 4.22–5.81)

## 2013-07-09 SURGERY — EGD (ESOPHAGOGASTRODUODENOSCOPY)
Anesthesia: Moderate Sedation

## 2013-07-09 MED ORDER — DIPHENHYDRAMINE HCL 50 MG/ML IJ SOLN
INTRAMUSCULAR | Status: AC
  Filled 2013-07-09: qty 1

## 2013-07-09 MED ORDER — MIDAZOLAM HCL 5 MG/ML IJ SOLN
INTRAMUSCULAR | Status: AC
  Filled 2013-07-09: qty 2

## 2013-07-09 MED ORDER — ONDANSETRON HCL 4 MG PO TABS
8.0000 mg | ORAL_TABLET | Freq: Four times a day (QID) | ORAL | Status: DC | PRN
  Filled 2013-07-09: qty 1

## 2013-07-09 MED ORDER — PANTOPRAZOLE SODIUM 40 MG PO TBEC
40.0000 mg | DELAYED_RELEASE_TABLET | Freq: Two times a day (BID) | ORAL | Status: DC
  Administered 2013-07-09 – 2013-07-12 (×6): 40 mg via ORAL
  Filled 2013-07-09 (×8): qty 1

## 2013-07-09 MED ORDER — SODIUM CHLORIDE 0.9 % IV SOLN: INTRAVENOUS | Status: DC

## 2013-07-09 MED ORDER — BUTAMBEN-TETRACAINE-BENZOCAINE 2-2-14 % EX AERO
INHALATION_SPRAY | CUTANEOUS | Status: DC | PRN
  Administered 2013-07-09: 2 via TOPICAL

## 2013-07-09 MED ORDER — FENTANYL CITRATE 0.05 MG/ML IJ SOLN
INTRAMUSCULAR | Status: AC
  Filled 2013-07-09: qty 2

## 2013-07-09 MED ORDER — FENTANYL CITRATE 0.05 MG/ML IJ SOLN
INTRAMUSCULAR | Status: DC | PRN
  Administered 2013-07-09 (×2): 25 ug via INTRAVENOUS

## 2013-07-09 MED ORDER — MIDAZOLAM HCL 10 MG/2ML IJ SOLN
INTRAMUSCULAR | Status: DC | PRN
  Administered 2013-07-09 (×2): 2 mg via INTRAVENOUS

## 2013-07-09 MED ORDER — ONDANSETRON HCL 4 MG/2ML IJ SOLN: 4.0000 mg | Freq: Four times a day (QID) | INTRAMUSCULAR | Status: DC | PRN

## 2013-07-09 NOTE — H&P (View-Only) (Signed)
                                                                           [facility] Consult: 12:57 PM 07/08/2013  LOS: 2 days    Referring Provider: Dr Gherghe. Primary Care Physician:  James John, MD Primary Gastroenterologist:  Dr. Gessner    Reason for Consultation:  Nausea and vomiting.    HPI: [name] is a 78 y.o. male.  Multiple medical problems. Significant vascular pathology and vascular stenting history.   Admission 11/3 - 06/28/13 with diarrhea from Norvirus (GI pathogen panel + for this). Treated for resp failure/volume overload, ARF, metabolic acidosis, electrolyte depletion.    Oral magnesium had recently been added and was discontinued.  Transaminases elevated during admission.   He was tolerating limited pos near discharge but nausea has been recurrent at home if he tries to eat solids.  He vomits and is unable to keep down solids.  No bloody or dark emesis. Takes chronic daily Nexium and HS Zantac. Also on chronic po Iron, Plavix, 81 ASA, pletal. Before recent illness was eating well and had no GI issues, no dysphagia or dypepsia.  Stools down from up to 8 daily, now one per day which is soft to loose and unformed.  Mild epigastric abdominal pain.    On CT scan of 06/17/13 had stones in GB. No signs of cholecystitis. Also has extensive vascular calcifications of aorta.     Past Medical History  Diagnosis Date  . CEREBROVASCULAR ACCIDENT, HX OF 03/02/2007  . CHEST PAIN 01/31/2010  . CHRONIC OBSTRUCTIVE PULMONARY DISEASE, ACUTE EXACERBATION 02/18/2009  . COPD 03/02/2007  . CORONARY ARTERY DISEASE 03/02/2007  . DEPRESSION 09/23/2007    anxiety  . DISEASE, PERIPHERAL VASCULAR NEC 03/02/2007  . DIVERTICULOSIS, COLON 09/23/2007  . FATIGUE 01/22/2009  . GERD 03/02/2007  . GLUCOSE INTOLERANCE 09/23/2007  . HEMORRHOIDS 10/09/2007  . HIATAL HERNIA 10/09/2007  .  HYPERCHOLESTEROLEMIA 10/09/2007  . HYPERTENSION 09/23/2007  . HYPOKALEMIA 10/09/2007  . MESENTERIC VASCULAR INSUFFICIENCY 07/06/2008  . MITRAL REGURGITATION 11/27/2008  . MI 10/09/2007  . PERIPHERAL VASCULAR DISEASE 09/23/2007  . PUD 10/09/2007  . RENAL ARTERY STENOSIS 03/02/2007  . TOBACCO ABUSE 04/20/2010  . 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 URINARY RETENTION 01/22/2009  . VITAMIN B12 DEFICIENCY 01/03/2010  . WRIST PAIN, RIGHT 06/06/2010  . Impaired glucose tolerance 01/29/2011  . Hearing loss in left ear 01/30/2011  . Blindness of both eyes 01/30/2011  . Hyperlipidemia   . Anemia, iron deficiency   . Atrial flutter   . HTN (hypertension) 03/21/2011  . AVM (arteriovenous malformation) 09/2010    stomach and duodenum  . PUD (peptic ulcer disease) 09/2010    pyloric ulcer    Past Surgical History  Procedure Laterality Date  . Coronary artery bypass graft  1995    LIMA-LAD, SVG-OM, SVG-D, SVG-PDA  . Renal artery stenting  2004    bilateral  . S/p multiple le vascular bypass      includine fem-pop x 2 LLE  . S/p bilateral cea    . Coronary angioplasty with stent placement  2010    DES x 2 SVG-OM, cutting balloon PTCA SVG-PDA for ISR  .   Appendectomy    . S/p lumbar disc surgury      x2  . S/p rle fem bypass feb 2011    . Cardiac catheterization    . Pr vein bypass graft,aorto-fem-pop    . Carotid endarterectomy    . Coronary angioplasty with stent placement  2011    DES to SVG-DIAG, SVG-PDA  . Esophagogastroduodenoscopy    . Colonoscopy      Prior to Admission medications   Medication Sig Start Date End Date Taking? Authorizing Provider  acetaminophen (TYLENOL) 500 MG tablet Take 500 mg by mouth every 6 (six) hours as needed.   Yes Historical Provider, MD  amiodarone (PACERONE) 400 MG tablet Take 0.5 tablets (200 mg total) by mouth daily. 06/28/13 07/06/13 Yes Abraham Feliz Ortiz, MD  amLODipine (NORVASC) 10 MG tablet Take 10 mg by mouth daily.   Yes Historical Provider,  MD  aspirin 81 MG EC tablet Take 81 mg by mouth daily.     Yes Historical Provider, MD  atorvastatin (LIPITOR) 40 MG tablet Take 40 mg by mouth daily.   Yes Historical Provider, MD  carvedilol (COREG) 3.125 MG tablet Take 1 tablet (3.125 mg total) by mouth 2 (two) times daily with a meal. 06/28/13  Yes Abraham Feliz Ortiz, MD  cilostazol (PLETAL) 100 MG tablet Take 100 mg by mouth 2 (two) times daily.   Yes Historical Provider, MD  clopidogrel (PLAVIX) 75 MG tablet Take 75 mg by mouth daily.   Yes Historical Provider, MD  esomeprazole (NEXIUM) 40 MG capsule Take 1 capsule (40 mg total) by mouth daily. 12/20/12  Yes James W John, MD  ferrous sulfate 325 (65 FE) MG tablet Take 325 mg by mouth 3 (three) times daily.     Yes Historical Provider, MD  furosemide (LASIX) 20 MG tablet Take 2 tablets (40 mg total) by mouth daily. 06/28/13  Yes Abraham Feliz Ortiz, MD  isosorbide mononitrate (IMDUR) 60 MG 24 hr tablet Take 60 mg by mouth daily.   Yes Historical Provider, MD  lisinopril (PRINIVIL,ZESTRIL) 10 MG tablet Take 10 mg by mouth daily.   Yes Historical Provider, MD  ondansetron (ZOFRAN) 4 MG tablet Take 1 tablet (4 mg total) by mouth every 8 (eight) hours as needed for nausea or vomiting. 07/04/13  Yes James W John, MD  ranitidine (ZANTAC) 150 MG tablet Take 1 tablet (150 mg total) by mouth at bedtime. 01/03/13  Yes James W John, MD  risperiDONE (RISPERDAL) 0.5 MG tablet Take 1 tablet (0.5 mg total) by mouth 2 (two) times daily. 06/10/13  Yes James W John, MD  sertraline (ZOLOFT) 100 MG tablet Take 1 tablet (100 mg total) by mouth daily. 01/03/13  Yes James W John, MD  nitroGLYCERIN (NITROSTAT) 0.4 MG SL tablet Place 1 tablet (0.4 mg total) under the tongue every 5 (five) minutes as needed for chest pain. 12/17/12   Jai-Gurmukh Samtani, MD    Scheduled Meds: . amiodarone  200 mg Oral Daily  . aspirin EC  81 mg Oral Daily  . atorvastatin  40 mg Oral Daily  . carvedilol  3.125 mg Oral BID WC  .  cilostazol  100 mg Oral BID  . clopidogrel  75 mg Oral Q breakfast  . enoxaparin (LOVENOX) injection  40 mg Subcutaneous Daily  . famotidine  20 mg Oral QHS  . feeding supplement (RESOURCE BREEZE)  1 Container Oral BID BM  . ferrous sulfate  325 mg Oral TID  . pantoprazole  40 mg Oral   Daily  . risperiDONE  0.5 mg Oral BID  . sertraline  100 mg Oral Daily  . sodium chloride  3 mL Intravenous Q12H   Infusions: . sodium chloride 50 mL/hr at 07/08/13 0916   PRN Meds: acetaminophen, acetaminophen, albuterol, HYDROcodone-acetaminophen, ondansetron (ZOFRAN) IV, ondansetron   Allergies as of 07/06/2013  . (No Known Allergies)    Family History  Problem Relation Age of Onset  . Colon cancer Mother 42  . Heart disease Father   . Cancer Father   . Heart disease Sister   . Prostate cancer Brother     prostate cancer  . Stroke Sister     History   Social History  . Marital Status: Married    Spouse Name: N/A    Number of Children: 2  . Years of Education: N/A   Occupational History  . 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 Smoking status: Current Every Day Smoker -- 1.00 packs/day for 60 years    Types: Cigarettes  . Smokeless tobacco: Never Used     Comment: 1 ppd  . Alcohol Use: No  . Drug Use: No  . Sexual Activity: Not on file   Other Topics Concern  . Not on file   Social History Narrative   Daily caffeine use 1/2 decaf 1/2 caffeine coffee a day    REVIEW OF SYSTEMS: Constitutional:  No falls, + weakness ENT:  No nose bleeds Pulm:  No new SOB.  + non-productive cough CV:  No palpitation, edema or chest pain.  GU:  No dysuria, no hematuria, no oliguria GI:  Per HPI Heme:  On chronic Iron.    Transfusions:  In past Neuro:  No headaches, not dizzy, no falls.  No gait problems Derm:  No rash or sores, no itching Endocrine:  No sweats, no chills, no polydipsia Immunization:  Up to date on vaccinces Travel:   None.    PHYSICAL EXAM: Vital signs in last 24 hours: Filed Vitals:   07/08/13 1136  BP: 101/37  Pulse: 96  Temp: 101 F (38.3 C)  Resp: 17   Wt Readings from Last 3 Encounters:  07/08/13 73.2 kg (161 lb 6 oz)  07/02/13 68.493 kg (151 lb)  06/28/13 72.4 kg (159 lb 9.8 oz)   General: frail, thin, alert and comfortable aged WM.  Looks old for age Head:  No trauma, no swelling  Eyes:  No icterus or pallor Ears:  Not HOH  Nose:  No discharge or bleeding Mouth:  Moist, clear, edentulous Neck:  No masses Lungs:  Clear bil.  No dyspnea Heart: RRR with 1/6 murmer.  Abdomen:  Soft, ND, mild epigastric tenderness.  No mass, no HSM.   Rectal: not done   Musc/Skeltl: no joint swelling Extremities:  No CCE  Neurologic:  Oriented x 3.  No tremor.  No gross deficits Skin:  No telangectasia Tattoos:  none Nodes:  No cervical adenopathy.    Psych:  Cooperative, relaxed.   Intake/Output from previous day: 11/24 0701 - 11/25 0700 In: 15 [P.O.:12; I.V.:3] Out: 1400 [Urine:1400] Intake/Output this shift: Total I/O In: -  Out: 550 [Urine:550]  LAB RESULTS:  Recent Labs  07/06/13 1800 07/07/13 0050 07/08/13 0400  WBC 11.1* 12.4* 13.2*  HGB 9.8* 9.7* 8.8*  HCT 29.1* 29.4* 27.2*  PLT 423* 399 373   BMET Lab Results  Component Value Date   NA 134* 07/08/2013   NA 133*   07/07/2013   NA 133* 07/06/2013   K 3.7 07/08/2013   K 3.4* 07/07/2013   K 3.2* 07/06/2013   CL 102 07/08/2013   CL 102 07/07/2013   CL 100 07/06/2013   CO2 22 07/08/2013   CO2 19 07/07/2013   CO2 22 07/06/2013   GLUCOSE 69* 07/08/2013   GLUCOSE 90 07/07/2013   GLUCOSE 111* 07/06/2013   BUN 24* 07/08/2013   BUN 29* 07/07/2013   BUN 35* 07/06/2013   CREATININE 1.14 07/08/2013   CREATININE 1.80* 07/07/2013   CREATININE 2.41* 07/06/2013   CALCIUM 8.5 07/08/2013   CALCIUM 8.6 07/07/2013   CALCIUM 8.7 07/06/2013   LFT  Recent Labs  07/06/13 1800 07/07/13 0050  PROT 5.9* 5.7*  ALBUMIN 2.5*  2.4*  AST 13 14  ALT 11 11  ALKPHOS 176* 184*  BILITOT 0.2* 0.2*   PT/INR Lab Results  Component Value Date   INR 0.89 12/16/2012   INR 0.90 09/23/2010   INR 1.01 12/30/2009   Hepatitis Panel No results found for this basename: HEPBSAG, HCVAB, HEPAIGM, HEPBIGM,  in the last 72 hours C-Diff No components found with this basename: cdiff   Lipase     Component Value Date/Time   LIPASE 21 07/07/2013 1200    RADIOLOGY STUDIES: Dg Chest 2 View  07/06/2013   CLINICAL DATA:  Epigastric pain for 1 week, low blood pressure  EXAM: CHEST  2 VIEW  COMPARISON:  06/25/2013  FINDINGS: Heart size mildly enlarged. Heavy aortic arch calcification. Vascular pattern normal. Mild diffuse interstitial change most prominent in the right middle lobe and left base. Unchanged appearance when compared to prior study, except for mild decrease in the overall prominence of interstitial change. Right internal jugular catheter has been removed.  IMPRESSION: Decreased interstitial conspicuity suggesting resolving interstitial pulmonary edema.   Electronically Signed   By: Raymond  Rubner M.D.   On: 07/06/2013 18:50   Dg Abd 1 View  07/07/2013   CLINICAL DATA:  Increased abdominal pain, evaluate for ileus  EXAM: ABDOMEN - 1 VIEW  COMPARISON:  06/17/2013  FINDINGS: Mild nonspecific gas distention of the bowel. Negative for significant obstruction or ileus. Extensive aortoiliac calcifications noted. Bilateral renal and common iliac stents present. No acute osseous finding.  IMPRESSION: Negative for significant obstruction or ileus.   Electronically Signed   By: Trevor  Shick M.D.   On: 07/07/2013 08:19   Us Renal  07/07/2013   CLINICAL DATA:  Renal failure  EXAM: RENAL/URINARY TRACT ULTRASOUND COMPLETE  COMPARISON:  CT abdomen and pelvis 06/17/2013  FINDINGS: Right Kidney  Length: 10.3 cm. Normal cortical thickness and echogenicity for age. . 3 mm echogenic non shadowing focus mid right kidney corresponding the calculus  on prior CT. No mass or hydronephrosis.  Left Kidney  Length: 11.5 cm. Normal cortical thickness and echogenicity for age. No mass, hydronephrosis or shadowing calcification.  Bladder  Appears normal for degree of bladder distention.  IMPRESSION: 3 mm nonobstructing mid right renal calculus.  Otherwise negative exam.   Electronically Signed   By: Mark  Boles M.D.   On: 07/07/2013 08:48    ENDOSCOPIC STUDIES: 09/2010  EGD for gI bleeding  Dr Perry APC ablation of gastric and D2 AVMs.  Small pyloric ulcer.  Atrophic gastric mucosa  02/2010  EGD  For iron def anemia and B 12 deficiency  Dr Gessner ENDOSCOPIC IMPRESSION:  1) Moderate gastritis in the body and the antrum of the stomach  - biopsied  2) Otherwise normal examination    RECOMMENDATIONS:  1) Await biopsy results  colonoscopy is next  Colonoscopy  02/2010 ENDOSCOPIC IMPRESSION:  1) Two small polyps removed  2) Moderate diverticulosis in the sigmoid colon  3) Internal hemorrhoids  4) Otherwise normal examination, good prep  5) Personal history of 4 adenomas (2 tubulovillous and 1 large  with high-grade dysplasia) removal in 2008  RECOMMENDATIONS:  resume Plavix and continue other medications  Keep follow-up with Dr. John re: anemia   Pathology 02/2010 1. STOMACH, BIOPSY, : - CHRONIC GASTRITIS WITH MILD INCREASE IN EOSINOPHILS. - THERE IS NO EVIDENCE OF HELICOBACTER PYLORI, INTESTINAL METAPLASIA, DYSPLASIA, OR MALIGNANCY. - SEE COMMENT. 2. COLON, POLYP(S), TRANSVERSE, RECTUM : - TUBULAR ADENOMA. - HIGH GRADE DYSPLASIA IS NOT IDENTIFIED.  EGD and colonoscopy  in 2008   IMPRESSION:   *  N/V with solid foods in pt with recent norvirus and history PUD.  Diarrheal aspect of infection is much improved but not yet returmed to baseline.   *  Hx PUD  *  Hx gastric and duodenal AVMs associated with blood loss and anemia.  *  Gallstones without complications on CT scan 3 weeks ago. Minor elevation of AST, Alk phos then, now just  has elevated alk phos.  No abdominal pain.   *  Anemia.  Hgb baseline around 12.0. FOB positive on 06/18/2011. On chronic po Iron.   *  Extensive vascular disease, s/p stentings.  On Pletal and Plavix.   *  Adenomatous colon polyp in 2011   PLAN:     *  EGD tomorrow *  HIDA today   *  Dr Stark to see pt *  Note stool C diff and cultures orderd.    Sarah Gribbin  07/08/2013, 12:57 PM Pager: [phone]     Attending physician's note   I have taken a history, examined the patient and reviewed the chart. I agree with the Advanced Practitioner's note, impression and recommendations. Refratory N/V and mild epigastric pain. R/O ulcer, gastritis, esophagitis, symptomatic cholelithiasis and post infectious gastroparesis. Hold Fe. HIDA today. EGD tomorrow. Clear liquids as tolerated for now.   Malcolm T Stark, MD FACG      

## 2013-07-09 NOTE — Interval H&P Note (Signed)
History and Physical Interval Note:  07/09/2013 11:18 AM  [name]  has presented today for surgery, with the diagnosis of nausea and vomiting  The various methods of treatment have been discussed with the patient and family. After consideration of risks, benefits and other options for treatment, the patient has consented to  Procedure(s): ESOPHAGOGASTRODUODENOSCOPY (EGD) (N/A) as a surgical intervention .  The patient's history has been reviewed, patient examined, no change in status, stable for surgery.  I have reviewed the patient's chart and labs.  Questions were answered to the patient's satisfaction.     Venita Lick. Russella Dar MD [facility]

## 2013-07-09 NOTE — Progress Notes (Signed)
07/09/2013, 8:35 AM  LOS: 3 days   SUBJECTIVE:       Not seen  OBJECTIVE:         Vital signs in last 24 hours:    Temp:  [97.8 F (36.6 C)-98.3 F (36.8 C)] 97.9 F (36.6 C) (11/26 0408) Pulse Rate:  [73-96] 79 (11/26 0408) Resp:  [15-30] 15 (11/26 0408) BP: (73-119)/(35-59) 119/43 mmHg (11/26 0408) SpO2:  [93 %-99 %] 95 % (11/26 0408) Weight:  [72.9 kg (160 lb 11.5 oz)] 72.9 kg (160 lb 11.5 oz) (11/26 0408) Last BM Date: 07/08/13  Not reexamined   Intake/Output from previous day: 11/25 0701 - 11/26 0700 In: 436.7 [I.V.:436.7] Out: 950 [Urine:950]  Intake/Output this shift: Total I/O In: 15 [I.V.:15] Out: -   Lab Results:  Recent Labs  07/07/13 0050 07/08/13 0400 07/09/13 0537  WBC 12.4* 13.2* 12.2*  HGB 9.7* 8.8* 8.2*  HCT 29.4* 27.2* 24.8*  PLT 399 373 343   BMET  Recent Labs  07/07/13 0050 07/08/13 0400 07/09/13 0537  NA 133* 134* 135  K 3.4* 3.7 3.6  CL 102 102 102  CO2 19 22 23   GLUCOSE 90 69* 76  BUN 29* 24* 24*  CREATININE 1.80* 1.14 1.14  CALCIUM 8.6 8.5 8.6   LFT  Recent Labs  07/06/13 1800 07/07/13 0050  PROT 5.9* 5.7*  ALBUMIN 2.5* 2.4*  AST 13 14  ALT 11 11  ALKPHOS 176* 184*  BILITOT 0.2* 0.2*   PT/INR No results found for this basename: LABPROT, INR,  in the last 72 hours Hepatitis Panel No results found for this basename: HEPBSAG, HCVAB, HEPAIGM, HEPBIGM,  in the last 72 hours  Studies/Results: US Abdomen Complete  07/08/2013   CLINICAL DATA:  Nausea.  Abdominal pain.  EXAM: ULTRASOUND ABDOMEN COMPLETE  COMPARISON:  None.  FINDINGS: Gallbladder  Several small less than 1 cm gallstones noted as well as minimal amount of sludge. No evidence of gallbladder wall thickening. No sonographic Murphy sign noted.  Common bile duct  Diameter: 4 mm  Liver  No focal lesion identified. Within normal limits in parenchymal echogenicity.  IVC  No abnormality visualized.   Pancreas  Visualized portion unremarkable.  Spleen  Size and appearance within normal limits.  Right Kidney  Length: 11.0 cm. Echogenicity within normal limits. No mass or hydronephrosis visualized.  Left Kidney  Length: 12.4 cm. Echogenicity within normal limits. No mass or hydronephrosis visualized.  Abdominal aorta  No aneurysm visualized.  IMPRESSION: Cholelithiasis. No sonographic signs of cholecystitis, biliary dilatation, or other acute findings.   Electronically Signed   By: Myles Rosenthal M.D.   On: 07/08/2013 20:26   US Renal  07/07/2013   CLINICAL DATA:  Renal failure  EXAM: RENAL/URINARY TRACT ULTRASOUND COMPLETE  COMPARISON:  CT abdomen and pelvis 06/17/2013  FINDINGS: Right Kidney  Length: 10.3 cm. Normal cortical thickness and echogenicity for age. . 3 mm echogenic non shadowing focus mid right kidney corresponding the calculus on prior CT. No mass or hydronephrosis.  Left Kidney  Length: 11.5 cm. Normal cortical thickness and echogenicity for age. No mass, hydronephrosis or shadowing calcification.  Bladder  Appears normal for degree of bladder distention.  IMPRESSION: 3 mm nonobstructing mid right renal calculus.  Otherwise negative exam.   Electronically Signed   By: Ulyses Southward M.D.   On: 07/07/2013 08:48    ASSESMENT:   *  Nausea, vomiting and epigastric pain in pt with recent 12 day admission with  norvirus.   Uncomplicated cholelithiasis on ultrasound.  HIDA planned for 11/28 (soonest possible day due to EGD today and holiday tomorrow. ALK phos persistently elevation  *  On plavix for ASPVD and multiple stents.    PLAN   *  EGD today   Jennye Moccasin  07/09/2013, 8:35 AM Pager: [phone]    Attending physician's note   I have taken an interval history, reviewed the chart and examined the patient. I agree with the Advanced Practitioner's note, impression and recommendations.   Venita Lick. Russella Dar, MD [facility]

## 2013-07-09 NOTE — Op Note (Signed)
[facility] [address] [place], [address]   ENDOSCOPY PROCEDURE REPORT PATIENT: [name], [name]  MR#: [id number] BIRTHDATE: Mar 11, 1935 , 78  yrs. old GENDER: Male ENDOSCOPIST: Meryl Dare, MD, [facility] REFERRED BY:  [facility] PROCEDURE DATE:  07/09/2013 PROCEDURE:  EGD w/ biopsy ASA CLASS:     Class III INDICATIONS:  Epigastric pain.   Nausea.   Vomiting. MEDICATIONS: medications were titrated to patient response per physician's verbal order, Fentanyl 50 mcg IV, and Versed 4 mg IV TOPICAL ANESTHETIC: Cetacaine Spray DESCRIPTION OF PROCEDURE: After the risks benefits and alternatives of the procedure were thoroughly explained, informed consent was obtained.  The Pentax Gastroscope [id number] endoscope was introduced through the mouth and advanced to the second portion of the duodenum  limited by retained gastric solids and liquids.  The instrument was slowly withdrawn as the mucosa was fully examined.  ESOPHAGUS: The mucosa of the esophagus appeared normal. STOMACH: Three medium sized non-bleeding irregular shaped ulcers with a pigmented spot were found in the gastric antrum.  Biopsies were taken around the ulcers.  A single non-bleeding shallow and clean-based ulcer, measuring  20 x 20mm in size, was found in the gastric fundus. Biopsies taken around the ulcer.  A small angiodysplastic lesion was found in the gastric antrum.   Retained solids and liquids in the body and fundus limited the visualization. DUODENUM: Mild duodenal inflammation was found in the duodenal bulb. The duodenal mucosa showed no abnormalities in the 2nd part of the duodenum.  Retroflexed views revealed a Mallory-Weiss tear. The scope was then withdrawn from the patient and the procedure completed. COMPLICATIONS: There were no complications. ENDOSCOPIC IMPRESSION: 1.   Three medium sized gastric antral ulcers 2.   Single, large, gastric fundus ulcer 3.   Angiodysplastic  lesion in the gastric antrum 4.   Duodenitis  RECOMMENDATIONS: 1.  Avoid ASA/NSAIDS for 2 months 2.  Await pathology results 3.  PPI bid 4.  Endoscopy in 2-3 months with Dr. Leone Payor to document ulcer healing  eSigned:  Meryl Dare, MD, [facility] 07/09/2013 1:29 PM

## 2013-07-09 NOTE — Progress Notes (Signed)
PT Cancellation Note  Patient Details Name: DOMINQUE MARLIN MRN: [id number] DOB: Jan 02, 1935   Cancelled Treatment:    Reason Eval/Treat Not Completed: Patient at procedure or test/unavailable.  Pt too lethargic.  Nursing asked for HOLD PT today.   INGOLD,Daivik Overley 07/09/2013, 1:35 PM [facility] [phone] (pager)

## 2013-07-09 NOTE — Progress Notes (Signed)
[facility] PROGRESS NOTE  [name] JXB:[id number] DOB: 1934-10-15 DOA: 07/06/2013 PCP: [name], MD  Assessment/Plan: Last month patient was admitted with Enteritis thought to be due to Norvirus. HE became severely dehydrated and went into VT and cardiac arrest. Patient REQUIRED INTUBATION and spend time in ICU . He was discharged to home last week but continued to have nausea occasional vomiting and at least one loose BM a day. Denies any fever or chills. He reported some abdominal pain but no chest pain. Family reports sacral decubitus. Patient has severe fatigue. Family declined PT/OT at home stating patient is too sick.   Assessment/Plan:  1-Acute kidney injury - secondary to dehydration. Improved with IV fluids. Cr peak to 2.4 on admission. Cr has decrease to 1.1. - [place] normal   2-Ventricular tachycardia, paroxysmal - continue coreg.  3-Hypotension - respond to IV fluids. Holding lasix.  lactic acid WNL. Repeat BP this am 100 range. Holder parameters for coreg.   4-Enteritis due to Norovirus - persistent diarrhea although improved. c diff negative. Stool culture pending.   5-Persistent Nausea and vomiting:  - Patient with prior history of AVMs, last endoscopy was in 2012, FOBT positive.  -Hemoglobin this morning 8.2. yesterday 8.8. -Endoscopy today. Holding plavix and aspirin. Depending on endoscopy result will consider resume plavix. - C. difficile pending.  -Increase Alk phosphatase. For HIDA scan 11-28. [place]:  Cholelithiasis. No sonographic signs of cholecystitis, biliary  dilatation, or other acute findings.   CORONARY ARTERY DISEASE - no chest pain, cardiac enzymes negative. Holding plavix and aspirin in setting occult blood positive. Resume after procedure if ok with GI.   Atrial fibrillation with RVR - continue amiodarone  Chronic systolic heart failure - daily weights. Weight stable. Holding lasix.    Code Status: Full Code.  Family Communication: Care discussed  with patient, step granddaughter.  Disposition Plan: remain in the step down unit.    Consultants:  Dr Russella Dar.   Procedures:  Endoscopy 11-26 pending.   Antibiotics:  None  HPI/Subjective: Feeling well. No BM today. Had 2 loose stool yesterday. No abdominal pain. No vomiting.   Objective: Filed Vitals:   07/09/13 0834  BP: 76/40  Pulse: 76  Temp: 98.1 F (36.7 C)  Resp: 12    Intake/Output Summary (Last 24 hours) at 07/09/13 0921 Last data filed at 07/09/13 0800  Gross per 24 hour  Intake 451.67 ml  Output    650 ml  Net -198.33 ml   Filed Weights   07/06/13 2215 07/08/13 0400 07/09/13 0408  Weight: 73.2 kg (161 lb 6 oz) 73.2 kg (161 lb 6 oz) 72.9 kg (160 lb 11.5 oz)    Exam:   General: No distress, alert, following command.   Cardiovascular: S 1, S 2 RRR  Respiratory: CTA  Abdomen: BS present, soft, nt  Musculoskeletal: no edema.   Data Reviewed: Basic Metabolic Panel:  Recent Labs Lab 07/06/13 1800 07/07/13 0050 07/08/13 0400 07/09/13 0537  NA 133* 133* 134* 135  K 3.2* 3.4* 3.7 3.6  CL 100 102 102 102  CO2 22 19 22 23   GLUCOSE 111* 90 69* 76  BUN 35* 29* 24* 24*  CREATININE 2.41* 1.80* 1.14 1.14  CALCIUM 8.7 8.6 8.5 8.6  MG  --  1.8  --   --   PHOS  --  4.1  --   --    Liver Function Tests:  Recent Labs Lab 07/06/13 1800 07/07/13 0050  AST 13 14  ALT 11  11  ALKPHOS 176* 184*  BILITOT 0.2* 0.2*  PROT 5.9* 5.7*  ALBUMIN 2.5* 2.4*    Recent Labs Lab 07/07/13 1200  LIPASE 21   No results found for this basename: AMMONIA,  in the last 168 hours CBC:  Recent Labs Lab 07/06/13 1800 07/07/13 0050 07/08/13 0400 07/09/13 0537  WBC 11.1* 12.4* 13.2* 12.2*  NEUTROABS 8.9*  --   --   --   HGB 9.8* 9.7* 8.8* 8.2*  HCT 29.1* 29.4* 27.2* 24.8*  MCV 93.9 93.9 95.1 95.0  PLT 423* 399 373 343   Cardiac Enzymes:  Recent Labs Lab 07/07/13 0050 07/07/13 0547 07/07/13 1200  TROPONINI <0.30 <0.30 <0.30   BNP (last 3  results)  Recent Labs  12/23/12 2045  PROBNP 1188.0*   CBG: No results found for this basename: GLUCAP,  in the last 168 hours  Recent Results (from the past 240 hour(s))  MRSA PCR SCREENING     Status: None   Collection Time    07/06/13 10:25 PM      Result Value Range Status   MRSA by PCR NEGATIVE  NEGATIVE Final   Comment:            The GeneXpert MRSA Assay (FDA     approved for NASAL specimens     only), is one component of a     comprehensive MRSA colonization     surveillance program. It is not     intended to diagnose MRSA     infection nor to guide or     monitor treatment for     MRSA infections.  STOOL CULTURE     Status: None   Collection Time    07/08/13  4:55 PM      Result Value Range Status   Specimen Description STOOL   Final   Special Requests Normal   Final   Culture     Final   Value: Culture reincubated for better growth     Performed at [facility]   Report Status PENDING   Incomplete     Studies: US Abdomen Complete  07/08/2013   CLINICAL DATA:  Nausea.  Abdominal pain.  EXAM: ULTRASOUND ABDOMEN COMPLETE  COMPARISON:  None.  FINDINGS: Gallbladder  Several small less than 1 cm gallstones noted as well as minimal amount of sludge. No evidence of gallbladder wall thickening. No sonographic Murphy sign noted.  Common bile duct  Diameter: 4 mm  Liver  No focal lesion identified. Within normal limits in parenchymal echogenicity.  IVC  No abnormality visualized.  Pancreas  Visualized portion unremarkable.  Spleen  Size and appearance within normal limits.  Right Kidney  Length: 11.0 cm. Echogenicity within normal limits. No mass or hydronephrosis visualized.  Left Kidney  Length: 12.4 cm. Echogenicity within normal limits. No mass or hydronephrosis visualized.  Abdominal aorta  No aneurysm visualized.  IMPRESSION: Cholelithiasis. No sonographic signs of cholecystitis, biliary dilatation, or other acute findings.   Electronically Signed   By: Myles Rosenthal M.D.   On: 07/08/2013 20:26    Scheduled Meds: . amiodarone  200 mg Oral Daily  . aspirin EC  81 mg Oral Daily  . atorvastatin  40 mg Oral Daily  . carvedilol  3.125 mg Oral BID WC  . cilostazol  100 mg Oral BID  . clopidogrel  75 mg Oral Q breakfast  . enoxaparin (LOVENOX) injection  40 mg Subcutaneous Daily  . famotidine  20 mg Oral QHS  .  feeding supplement (RESOURCE BREEZE)  1 Container Oral BID BM  . pantoprazole  40 mg Oral Daily  . risperiDONE  0.5 mg Oral BID  . sertraline  100 mg Oral Daily  . sodium chloride  3 mL Intravenous Q12H   Continuous Infusions: . sodium chloride 20 mL/hr at 07/09/13 0715    Active Problems:   CORONARY ARTERY DISEASE   Acute renal failure   Hypotension   Atrial fibrillation with RVR   Enteritis due to Norovirus   Ventricular tachycardia, paroxysmal   Dehydration   Abdominal pain, epigastric   Nausea with vomiting   Calculus of gallbladder without mention of cholecystitis or obstruction    Time spent: 35 minutes.     REGALADO,BELKYS  [facility] Pager [phone]. If 7PM-7AM, please contact night-coverage at [email or web address], password [email or web address] 07/09/2013, 9:21 AM  LOS: 3 days

## 2013-07-10 DIAGNOSIS — K259 Gastric ulcer, unspecified as acute or chronic, without hemorrhage or perforation: Secondary | ICD-10-CM

## 2013-07-10 LAB — COMPREHENSIVE METABOLIC PANEL
Albumin: 2.1 g/dL — ABNORMAL LOW (ref 3.5–5.2)
BUN: 17 mg/dL (ref 6–23)
CO2: 24 mEq/L (ref 19–32)
Chloride: 103 mEq/L (ref 96–112)
Creatinine, Ser: 0.99 mg/dL (ref 0.50–1.35)
GFR calc Af Amer: 90 mL/min — ABNORMAL LOW (ref >90)
Glucose, Bld: 86 mg/dL (ref 70–99)
Potassium: 3.1 mEq/L — ABNORMAL LOW (ref 3.5–5.1)
Total Bilirubin: 0.2 mg/dL — ABNORMAL LOW (ref 0.3–1.2)
Total Protein: 4.9 g/dL — ABNORMAL LOW (ref 6.0–8.3)

## 2013-07-10 LAB — CBC
HCT: 23.6 % — ABNORMAL LOW (ref 39.0–52.0)
MCH: 30.7 pg (ref 26.0–34.0)
MCHC: 32.6 g/dL (ref 30.0–36.0)
MCV: 94 fL (ref 78.0–100.0)
RBC: 2.51 MIL/uL — ABNORMAL LOW (ref 4.22–5.81)
RDW: 14.9 % (ref 11.5–15.5)

## 2013-07-10 MED ORDER — FUROSEMIDE 10 MG/ML IJ SOLN
20.0000 mg | Freq: Once | INTRAMUSCULAR | Status: AC
  Administered 2013-07-10: 20 mg via INTRAVENOUS

## 2013-07-10 MED ORDER — POTASSIUM CHLORIDE CRYS ER 20 MEQ PO TBCR
40.0000 meq | EXTENDED_RELEASE_TABLET | Freq: Once | ORAL | Status: AC
  Administered 2013-07-10: 40 meq via ORAL
  Filled 2013-07-10: qty 2

## 2013-07-10 MED ORDER — FUROSEMIDE 10 MG/ML IJ SOLN
INTRAMUSCULAR | Status: AC
  Filled 2013-07-10: qty 4

## 2013-07-10 NOTE — Progress Notes (Signed)
One unit of RBC successfully transfused without difficulty.  20 mg. Lasix given as well.

## 2013-07-10 NOTE — Progress Notes (Signed)
[facility] PROGRESS NOTE  [name] ZOX:[id number] DOB: November 24, 1934 DOA: 07/06/2013 PCP: [name], MD  Assessment/Plan: Last month patient was admitted with Enteritis thought to be due to Norvirus. HE became severely dehydrated and went into VT and cardiac arrest. Patient REQUIRED INTUBATION and spend time in ICU . He was discharged to home last week but continued to have nausea occasional vomiting and at least one loose BM a day. Denies any fever or chills. He reported some abdominal pain but no chest pain. Family reports sacral decubitus. Patient has severe fatigue. Family declined PT/OT at home stating patient is too sick.   Assessment/Plan:  1-Acute kidney injury - secondary to dehydration. Improved with IV fluids. Cr peak to 2.4 on admission. Cr has decrease to 0.9. - [place] normal   2-Ventricular tachycardia, paroxysmal - continue coreg.  3-Hypotension - respond to IV fluids. Holding lasix.  lactic acid WNL. Repeat BP this am 100 range. Holder parameters for coreg.   4-Enteritis due to Norovirus - persistent diarrhea although improved. c diff negative. Stool culture pending.   5-Persistent Nausea and vomiting:  - Patient with prior history of AVMs, last endoscopy was in 2012, FOBT positive.  -Hemoglobin this morning 7.7. Will give one unit PRBC. -Endoscopy 11-26: Three medium sized gastric antral ulcers. 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  Duodenitis. -Will Hold plavix and aspirin for 1 week, discussed with Dr Russella Dar.- C. difficile negative.  -Increase Alk phosphatase. For HIDA scan 11-28. [place]: Cholelithiasis. No sonographic signs of cholecystitis, biliary  dilatation, or other acute findings.   6-Acute blood loss anemia: in setting of GI bleed. Medium size gastric ulcer.  7-Transaminases; LFT trending down. HIDA scan tomorrow.   CORONARY ARTERY DISEASE - no chest pain, cardiac enzymes negative. Holding plavix and aspirin in  setting of GI bleed. .   Atrial fibrillation with RVR - continue amiodarone  Chronic systolic heart failure - daily weights. Weight stable. Holding lasix.    Code Status: Full Code.  Family Communication: Care discussed with patient.  Disposition Plan: remain in the step down unit.    Consultants:  Dr Russella Dar.   Procedures:  Endoscopy 11-26 pending.   Antibiotics:  None  HPI/Subjective: Feeling well. Had black stool last night. Wants to try regular diet.   Objective: Filed Vitals:   07/10/13 0742  BP: 105/41  Pulse: 75  Temp: 98.3 F (36.8 C)  Resp: 23    Intake/Output Summary (Last 24 hours) at 07/10/13 0841 Last data filed at 07/10/13 0400  Gross per 24 hour  Intake 894.67 ml  Output    701 ml  Net 193.67 ml   Filed Weights   07/08/13 0400 07/09/13 0408 07/10/13 0500  Weight: 73.2 kg (161 lb 6 oz) 72.9 kg (160 lb 11.5 oz) 74.2 kg (163 lb 9.3 oz)    Exam:   General: No distress, alert, following command.   Cardiovascular: S 1, S 2 RRR  Respiratory: CTA  Abdomen: BS present, soft, nt  Musculoskeletal: no edema.   Data Reviewed: Basic Metabolic Panel:  Recent Labs Lab 07/06/13 1800 07/07/13 0050 07/08/13 0400 07/09/13 0537 07/10/13 0430  NA 133* 133* 134* 135 135  K 3.2* 3.4* 3.7 3.6 3.1*  CL 100 102 102 102 103  CO2 22 19 22 23 24   GLUCOSE 111* 90 69* 76 86  BUN 35* 29* 24* 24* 17  CREATININE 2.41* 1.80* 1.14 1.14 0.99  CALCIUM 8.7 8.6 8.5 8.6 8.3*  MG  --  1.8  --   --   --   PHOS  --  4.1  --   --   --    Liver Function Tests:  Recent Labs Lab 07/06/13 1800 07/07/13 0050 07/09/13 1000 07/10/13 0430  AST 13 14 14 14   ALT 11 11 9 8   ALKPHOS 176* 184* 175* 152*  BILITOT 0.2* 0.2* 0.2* 0.2*  PROT 5.9* 5.7* 5.4* 4.9*  ALBUMIN 2.5* 2.4* 2.3* 2.1*    Recent Labs Lab 07/07/13 1200  LIPASE 21   No results found for this basename: AMMONIA,  in the last 168 hours CBC:  Recent Labs Lab 07/06/13 1800 07/07/13 0050  07/08/13 0400 07/09/13 0537 07/10/13 0430  WBC 11.1* 12.4* 13.2* 12.2* 8.5  NEUTROABS 8.9*  --   --   --   --   HGB 9.8* 9.7* 8.8* 8.2* 7.7*  HCT 29.1* 29.4* 27.2* 24.8* 23.6*  MCV 93.9 93.9 95.1 95.0 94.0  PLT 423* 399 373 343 324   Cardiac Enzymes:  Recent Labs Lab 07/07/13 0050 07/07/13 0547 07/07/13 1200  TROPONINI <0.30 <0.30 <0.30   BNP (last 3 results)  Recent Labs  12/23/12 2045  PROBNP 1188.0*   CBG: No results found for this basename: GLUCAP,  in the last 168 hours  Recent Results (from the past 240 hour(s))  MRSA PCR SCREENING     Status: None   Collection Time    07/06/13 10:25 PM      Result Value Range Status   MRSA by PCR NEGATIVE  NEGATIVE Final   Comment:            The GeneXpert MRSA Assay (FDA     approved for NASAL specimens     only), is one component of a     comprehensive MRSA colonization     surveillance program. It is not     intended to diagnose MRSA     infection nor to guide or     monitor treatment for     MRSA infections.  CLOSTRIDIUM DIFFICILE BY PCR     Status: None   Collection Time    07/08/13  4:55 PM      Result Value Range Status   C difficile by pcr NEGATIVE  NEGATIVE Final  STOOL CULTURE     Status: None   Collection Time    07/08/13  4:55 PM      Result Value Range Status   Specimen Description STOOL   Final   Special Requests Normal   Final   Culture     Final   Value: Culture reincubated for better growth     Performed at [facility]   Report Status PENDING   Incomplete     Studies: US Abdomen Complete  07/08/2013   CLINICAL DATA:  Nausea.  Abdominal pain.  EXAM: ULTRASOUND ABDOMEN COMPLETE  COMPARISON:  None.  FINDINGS: Gallbladder  Several small less than 1 cm gallstones noted as well as minimal amount of sludge. No evidence of gallbladder wall thickening. No sonographic Murphy sign noted.  Common bile duct  Diameter: 4 mm  Liver  No focal lesion identified. Within normal limits in parenchymal  echogenicity.  IVC  No abnormality visualized.  Pancreas  Visualized portion unremarkable.  Spleen  Size and appearance within normal limits.  Right Kidney  Length: 11.0 cm. Echogenicity within normal limits. No mass or hydronephrosis visualized.  Left Kidney  Length: 12.4 cm. Echogenicity within normal limits. No mass or hydronephrosis  visualized.  Abdominal aorta  No aneurysm visualized.  IMPRESSION: Cholelithiasis. No sonographic signs of cholecystitis, biliary dilatation, or other acute findings.   Electronically Signed   By: Myles Rosenthal M.D.   On: 07/08/2013 20:26    Scheduled Meds: . amiodarone  200 mg Oral Daily  . atorvastatin  40 mg Oral Daily  . carvedilol  3.125 mg Oral BID WC  . cilostazol  100 mg Oral BID  . famotidine  20 mg Oral QHS  . feeding supplement (RESOURCE BREEZE)  1 Container Oral BID BM  . pantoprazole  40 mg Oral BID AC  . potassium chloride  40 mEq Oral Once  . risperiDONE  0.5 mg Oral BID  . sertraline  100 mg Oral Daily  . sodium chloride  3 mL Intravenous Q12H   Continuous Infusions: . sodium chloride 20 mL/hr at 07/09/13 0715    Active Problems:   CORONARY ARTERY DISEASE   Acute renal failure   Hypotension   Atrial fibrillation with RVR   Enteritis due to Norovirus   Ventricular tachycardia, paroxysmal   Dehydration   Abdominal pain, epigastric   Nausea with vomiting   Calculus of gallbladder without mention of cholecystitis or obstruction   Gastric ulcer    Time spent: 35 minutes.     REGALADO,BELKYS  [facility] Pager [phone]. If 7PM-7AM, please contact night-coverage at [email or web address], password [facility] 07/10/2013, 8:41 AM  LOS: 4 days

## 2013-07-10 NOTE — Progress Notes (Signed)
[facility] Progress Note  Subjective:  Feels ok.  No abdominal pain.  Ate breakfast without any issues with nausea or vomiting.  Had two dark BM's according to his nurse.  Objective:  Vital signs in last 24 hours: Temp:  [97.5 F (36.4 C)-98.3 F (36.8 C)] 98.3 F (36.8 C) (11/27 0742) Pulse Rate:  [65-82] 74 (11/27 0800) Resp:  [10-30] 20 (11/27 0800) BP: (67-129)/(30-58) 110/40 mmHg (11/27 0800) SpO2:  [90 %-100 %] 99 % (11/27 0800) Weight:  [163 lb 9.3 oz (74.2 kg)] 163 lb 9.3 oz (74.2 kg) (11/27 0500) Last BM Date: 07/09/13 General:  Alert, chronically ill-appearing, in NAD Heart:  Regular rate and rhythm Pulm:  CTAB.  No W/R/R. Abdomen:  Soft, non-distended.  BS present.  Non-tender. Extremities:  Without edema. Neurologic:  Alert and  oriented x4;  grossly normal neurologically. Psych:  Alert and cooperative. Normal mood and affect.  Intake/Output from previous day: 11/26 0701 - 11/27 0700 In: 969.7 [P.O.:240; I.V.:729.7] Out: 701 [Urine:700; Stool:1] Intake/Output this shift: Total I/O In: 260 [P.O.:240; I.V.:20] Out: -   Lab Results:  Recent Labs  07/08/13 0400 07/09/13 0537 07/10/13 0430  WBC 13.2* 12.2* 8.5  HGB 8.8* 8.2* 7.7*  HCT 27.2* 24.8* 23.6*  PLT 373 343 324   BMET  Recent Labs  07/08/13 0400 07/09/13 0537 07/10/13 0430  NA 134* 135 135  K 3.7 3.6 3.1*  CL 102 102 103  CO2 22 23 24   GLUCOSE 69* 76 86  BUN 24* 24* 17  CREATININE 1.14 1.14 0.99  CALCIUM 8.5 8.6 8.3*   LFT  Recent Labs  07/09/13 1000 07/10/13 0430  PROT 5.4* 4.9*  ALBUMIN 2.3* 2.1*  AST 14 14  ALT 9 8  ALKPHOS 175* 152*  BILITOT 0.2* 0.2*  BILIDIR <0.1  --   IBILI NOT CALCULATED  --    US Abdomen Complete  07/08/2013   CLINICAL DATA:  Nausea.  Abdominal pain.  EXAM: ULTRASOUND ABDOMEN COMPLETE  COMPARISON:  None.  FINDINGS: Gallbladder  Several small less than 1 cm gallstones noted as well as minimal amount of sludge. No evidence of gallbladder  wall thickening. No sonographic Murphy sign noted.  Common bile duct  Diameter: 4 mm  Liver  No focal lesion identified. Within normal limits in parenchymal echogenicity.  IVC  No abnormality visualized.  Pancreas  Visualized portion unremarkable.  Spleen  Size and appearance within normal limits.  Right Kidney  Length: 11.0 cm. Echogenicity within normal limits. No mass or hydronephrosis visualized.  Left Kidney  Length: 12.4 cm. Echogenicity within normal limits. No mass or hydronephrosis visualized.  Abdominal aorta  No aneurysm visualized.  IMPRESSION: Cholelithiasis. No sonographic signs of cholecystitis, biliary dilatation, or other acute findings.   Electronically Signed   By: Myles Rosenthal M.D.   On: 07/08/2013 20:26    Assessment / Plan: *3 medium sized gastric antral ulcers and one large gastric fundus ulcer along with gastric AVM and duodenitis seen on EGD 11/26. * Nausea, vomiting and epigastric pain:  Possibly secondary to the above listed ulcer disease. *Uncomplicated cholelithiasis on ultrasound. HIDA planned for 11/28 (soonest possible day due to EGD today and holiday tomorrow).  ALK phos persistently elevated.  *On plavix for AS/PVD and multiple stents.  *Hypokalemia:  Will be corrected by primary service. *Normocytic anemia with decreasing Hgb.  Going to receive transfusion today.  Had two dark BM's which could have been blood from ulcers.  -Continue BID PPI.   -Avoid  NSAID's. Hold Plavix/ASA for 1 week if possible. -Await biopsy results from EGD. -Monitor Hgb. -Await results of HIDA as well. -Follow-up EGD with Dr. Leone Payor in 2-3 months to document healed gastric ulcers.    LOS: 4 days   ZEHR, JESSICA D.  07/10/2013, 10:40 AM  Pager number [phone]     Attending physician's note   I have taken an interval history, examined the patient and reviewed the chart. I agree with the Advanced Practitioner's note, impression and recommendations. 4 gastric ulcers. Monitor Hb and  for GI bleeding. HIDA scan tomorrow to complete the biliary evaluation.  Meryl Dare, MD [facility]

## 2013-07-11 ENCOUNTER — Inpatient Hospital Stay (HOSPITAL_COMMUNITY): Payer: Medicare Other

## 2013-07-11 LAB — BASIC METABOLIC PANEL
CO2: 22 mEq/L (ref 19–32)
Calcium: 8.5 mg/dL (ref 8.4–10.5)
Glucose, Bld: 80 mg/dL (ref 70–99)
Potassium: 3.5 mEq/L (ref 3.5–5.1)
Sodium: 136 mEq/L (ref 135–145)

## 2013-07-11 LAB — CBC
Hemoglobin: 8.9 g/dL — ABNORMAL LOW (ref 13.0–17.0)
MCH: 30.9 pg (ref 26.0–34.0)
MCV: 91.7 fL (ref 78.0–100.0)
RBC: 2.88 MIL/uL — ABNORMAL LOW (ref 4.22–5.81)

## 2013-07-11 LAB — TYPE AND SCREEN
ABO/RH(D): B POS
Antibody Screen: POSITIVE
DAT, IgG: NEGATIVE

## 2013-07-11 MED ORDER — TECHNETIUM TC 99M MEBROFENIN IV KIT: 5.0000 | PACK | Freq: Once | INTRAVENOUS | Status: AC | PRN

## 2013-07-11 MED ORDER — STERILE WATER FOR INJECTION IJ SOLN
INTRAMUSCULAR | Status: AC
  Filled 2013-07-11: qty 10

## 2013-07-11 MED ORDER — SINCALIDE 5 MCG IJ SOLR
INTRAMUSCULAR | Status: AC
  Administered 2013-07-11: 1.67 ug
  Filled 2013-07-11: qty 5

## 2013-07-11 NOTE — Progress Notes (Signed)
Pt transferred to room [address] 19 via wheelchair accompanied by RNs and grandson. Pt settled in bed. Pt alert&orientedx4. Placed on the monitor. VSS. Oriented pt to room, name of care givers and call bell. Report received from Sharlee Blew RN. Will continue to monitor.

## 2013-07-11 NOTE — Progress Notes (Signed)
HIDA scan negative for gallbladder issues.  Anticipate discharge in near future.  Will sign off from GI standpoint; call if needed.

## 2013-07-11 NOTE — Progress Notes (Signed)
Physical Therapy Treatment Patient Details Name: [name] MRN: [id number] DOB: 1934-10-26 Today's Date: 07/11/2013 Time: 9147-8295 PT Time Calculation (min): 24 min  PT Assessment / Plan / Recommendation  History of Present Illness [name] is a 78 y.o. male was brought to the ER the patient was found to be very weak. As per the family patient has been experiencing diarrhea over the last one week but has not had any nausea vomiting. 2 days ago patient had periumbilical pain which at this time patient states has resolved. The patient developed V-tach arrest on 11/6 and was resuscitated (see notes) intubated and transferred to the ICU; extubated 11/7.  Had another episode V-tach 11/8.     PT Comments   Pt admitted with above. Pt currently with functional limitations due to balance and endurance deficits.  Pt will benefit from skilled PT to increase their independence and safety with mobility to allow discharge to the venue listed below.   Follow Up Recommendations  Home health PT;Supervision/Assistance - 24 hour                 Equipment Recommendations  None recommended by PT        Frequency Min 3X/week   Progress towards PT Goals Progress towards PT goals: Progressing toward goals  Plan Current plan remains appropriate    Precautions / Restrictions Precautions Precautions: Fall Precaution Comments: monitor 02 sats Restrictions Weight Bearing Restrictions: No   Pertinent Vitals/Pain VSS, No pain    Mobility  Bed Mobility Bed Mobility: Supine to Sit;Sitting - Scoot to Edge of Bed;Sit to Supine Rolling Right: Not tested (comment) Right Sidelying to Sit: Not tested (comment) Supine to Sit: 5: Supervision Sitting - Scoot to Edge of Bed: 5: Supervision Details for Bed Mobility Assistance: cueing for safety and sequence Transfers Transfers: Sit to Stand;Stand to Sit Sit to Stand: With upper extremity assist;From bed;4: Min guard Stand to Sit: 4: Min guard;With upper  extremity assist;To chair/3-in-1;With armrests Details for Transfer Assistance: Assist to return to sitting safely due to fatigue Ambulation/Gait Ambulation/Gait Assistance: 4: Min assist Ambulation Distance (Feet): 250 Feet Assistive device: None Ambulation/Gait Assistance Details: Pt occasionally losing balance needing min asssit to catch self.  Cueing for posture and breathing technique.  Sats >90% throughout ambulation on RA.   Gait Pattern: Step-through pattern;Decreased stride length;Trunk flexed Gait velocity: decreased Stairs: No Wheelchair Mobility Wheelchair Mobility: No    Exercises General Exercises - Lower Extremity Ankle Circles/Pumps: AROM;Seated;Both;20 reps Long Arc Quad: AROM;Seated;Both;15 reps Hip ABduction/ADduction: AROM;Seated;Both;15 reps Hip Flexion/Marching: AROM;Seated;Both;15 reps   PT Goals (current goals can now be found in the care plan section)    Visit Information  Last PT Received On: 07/11/13 Assistance Needed: +1 History of Present Illness: [name] is a 78 y.o. male was brought to the ER the patient was found to be very weak. As per the family patient has been experiencing diarrhea over the last one week but has not had any nausea vomiting. 2 days ago patient had periumbilical pain which at this time patient states has resolved. The patient developed V-tach arrest on 11/6 and was resuscitated (see notes) intubated and transferred to the ICU; extubated 11/7.  Had another episode V-tach 11/8.      Subjective Data  Subjective: "I feel so weak."   Cognition  Cognition Arousal/Alertness: Awake/alert Behavior During Therapy: WFL for tasks assessed/performed Overall Cognitive Status: Within Functional Limits for tasks assessed    Balance  Balance Balance Assessed: Yes  Static Sitting Balance Static Sitting - Balance Support: No upper extremity supported;Feet supported Static Sitting - Level of Assistance: 5: Stand by assistance Static Sitting  - Comment/# of Minutes: 2  End of Session PT - End of Session Equipment Utilized During Treatment: Gait belt;Oxygen Activity Tolerance: Patient limited by fatigue Patient left: in chair;with call bell/phone within reach;with family/visitor present Nurse Communication: Mobility status       INGOLD,Adonica Fukushima 07/11/2013, 2:54 PM [facility] [phone] (pager)

## 2013-07-11 NOTE — Progress Notes (Addendum)
          Daily Rounding Note  07/11/2013, 8:23 AM  LOS: 5 days   SUBJECTIVE:  Feels well.  Ready to get out of the hospital.  Tolerating diet without nausea, vomiting, or abdominal pain.  OBJECTIVE:         Vital signs in last 24 hours:    Temp:  [97.6 F (36.4 C)-98.4 F (36.9 C)] 98.4 F (36.9 C) (11/28 0449) Pulse Rate:  [65-76] 74 (11/28 0449) Resp:  [15-21] 15 (11/28 0449) BP: (90-135)/(39-71) 90/39 mmHg (11/28 0449) SpO2:  [92 %-99 %] 92 % (11/28 0449) Weight:  [83.5 kg (184 lb 1.4 oz)] 83.5 kg (184 lb 1.4 oz) (11/28 0449) Last BM Date: 07/10/13 General: Alert, chronically ill-appearing, in NAD  Heart: Regular rate and rhythm.  Murmur noted.  Pulm: CTAB. No W/R/R.  Abdomen: Soft, non-distended. BS present. Non-tender.  Extremities: Without edema.  Neurologic: Alert and oriented x4; grossly normal neurologically.  Psych: Alert and cooperative. Normal mood and affect.   Intake/Output from previous day: 11/27 0701 - 11/28 0700 In: 976.5 [P.O.:240; I.V.:420; Blood:316.5] Out: 900 [Urine:900]  Lab Results:  Recent Labs  07/09/13 0537 07/10/13 0430 07/11/13 0435  WBC 12.2* 8.5 9.3  HGB 8.2* 7.7* 8.9*  HCT 24.8* 23.6* 26.4*  PLT 343 324 268   BMET  Recent Labs  07/09/13 0537 07/10/13 0430 07/11/13 0435  NA 135 135 136  K 3.6 3.1* 3.5  CL 102 103 104  CO2 23 24 22   GLUCOSE 76 86 80  BUN 24* 17 12  CREATININE 1.14 0.99 1.00  CALCIUM 8.6 8.3* 8.5   LFT  Recent Labs  07/09/13 1000 07/10/13 0430  PROT 5.4* 4.9*  ALBUMIN 2.3* 2.1*  AST 14 14  ALT 9 8  ALKPHOS 175* 152*  BILITOT 0.2* 0.2*  BILIDIR <0.1  --   IBILI NOT CALCULATED  --    Assessment / Plan:  *3 medium sized gastric antral ulcers and one large gastric fundus ulcer along with gastric AVM and duodenitis seen on EGD 11/26.  *Nausea, vomiting and epigastric pain: Possibly secondary to the above listed ulcer disease.  Symptoms resolved  at this point.  *Uncomplicated cholelithiasis on ultrasound. HIDA planned for today. ALK phos persistently elevated.  *On plavix for AS/PVD and multiple stents.  *Normocytic anemia:  Hgb improved today after a unit of PRBC's.   -Continue BID PPI long term.  -Avoid NSAID's. Hold Plavix/ASA for 1 week if possible.  -Await biopsy results from EGD.  -Monitor Hgb.  -Await results of HIDA as well.  -Follow-up EGD with Dr. Leone Payor in 2-3 months to document healed gastric ulcers.   Doug Sou, [facility]     Attending physician's note   I have taken an interval history, reviewed the chart and examined the patient. I agree with the Advanced Practitioner's note, impression and recommendations. If HIDA is negative he would be ready for discharge from GI standpoint. Gastric biopsies show chronic active gastritis, ulceration, reactive gastropathy and no H. pylori. PPI BID long term. ASA 81 mg daily ok beginning in 1 week but would strictly avoid any additional ASA/NSAIDs for the long term. OP GI follow up with Dr. Jacquiline Doe T. Russella Dar, MD [facility]

## 2013-07-11 NOTE — Progress Notes (Signed)
Pt had orders to transfer to 4 east Rm 19. Pt and pt's family notified. Pt's grandson following nurses to 4 east with pt. Pt in no distress at this time. Nurse called 4 Public affairs consultant and gave report. Pt's vitals within normal limits. Pt had hilar scan this am.

## 2013-07-11 NOTE — Progress Notes (Signed)
[facility] PROGRESS NOTE  GAETAN SPIEKER ZOX:[id number] DOB: 1935-04-12 DOA: 07/06/2013 PCP: [name], MD  Assessment/Plan: Last month patient was admitted with Enteritis thought to be due to Norvirus. HE became severely dehydrated and went into VT and cardiac arrest. Patient REQUIRED INTUBATION and spend time in ICU . He was discharged to home last week but continued to have nausea occasional vomiting and at least one loose BM a day. Denies any fever or chills. He reported some abdominal pain but no chest pain. Family reports sacral decubitus. Patient has severe fatigue. Family declined PT/OT at home stating patient is too sick.   Assessment/Plan:  1-Acute kidney injury - secondary to dehydration. Improved with IV fluids. Cr peak to 2.4 on admission. Cr has decrease to 0.9. - [place] normal   2-Ventricular tachycardia, paroxysmal - continue coreg.  3-Hypotension - respond to IV fluids. Holding lasix.  lactic acid WNL. Repeat BP this am 100 range. Holder parameters for coreg.   4-Enteritis due to Norovirus - persistent diarrhea although improved. c diff negative. Stool culture pending.   5-Persistent Nausea and vomiting:  -Patient with prior history of AVMs, last endoscopy was in 2012, FOBT positive.  -Hemoglobin this morning 8.9 after one unit PRBC.  -Endoscopy 11-26: Three medium sized gastric antral ulcers. 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  Duodenitis. -Will Hold plavix and aspirin for 1 week, discussed with Dr Russella Dar.- C. difficile negative.  -Increase Alk phosphatase. For HIDA scan 11-28. [place]: Cholelithiasis. No sonographic signs of cholecystitis, biliary  dilatation, or other acute findings.   6-Acute blood loss anemia: in setting of GI bleed. Medium size gastric ulcer.  7-Transaminases; LFT trending down. HIDA today.   CORONARY ARTERY DISEASE - no chest pain, cardiac enzymes negative. Holding plavix and aspirin in setting of GI  bleed. .  Atrial fibrillation with RVR - continue amiodarone.  Chronic systolic heart failure - daily weights. Weight stable. Holding lasix.    Code Status: Full Code.  Family Communication: Care discussed with patient.  Disposition Plan: transfer to regular bed.    Consultants:  Dr Russella Dar.   Procedures:  Endoscopy 11-26  Antibiotics:  None  HPI/Subjective: Tolerating regular diet  Objective: Filed Vitals:   07/11/13 0449  BP: 90/39  Pulse: 74  Temp: 98.4 F (36.9 C)  Resp: 15    Intake/Output Summary (Last 24 hours) at 07/11/13 0816 Last data filed at 07/11/13 0451  Gross per 24 hour  Intake  716.5 ml  Output    900 ml  Net -183.5 ml   Filed Weights   07/09/13 0408 07/10/13 0500 07/11/13 0449  Weight: 72.9 kg (160 lb 11.5 oz) 74.2 kg (163 lb 9.3 oz) 83.5 kg (184 lb 1.4 oz)    Exam:   General: No distress, alert, following command.   Cardiovascular: S 1, S 2 RRR  Respiratory: CTA  Abdomen: BS present, soft, nt  Musculoskeletal: no edema.   Data Reviewed: Basic Metabolic Panel:  Recent Labs Lab 07/07/13 0050 07/08/13 0400 07/09/13 0537 07/10/13 0430 07/11/13 0435  NA 133* 134* 135 135 136  K 3.4* 3.7 3.6 3.1* 3.5  CL 102 102 102 103 104  CO2 19 22 23 24 22   GLUCOSE 90 69* 76 86 80  BUN 29* 24* 24* 17 12  CREATININE 1.80* 1.14 1.14 0.99 1.00  CALCIUM 8.6 8.5 8.6 8.3* 8.5  MG 1.8  --   --   --   --   PHOS 4.1  --   --   --   --  Liver Function Tests:  Recent Labs Lab 07/06/13 1800 07/07/13 0050 07/09/13 1000 07/10/13 0430  AST 13 14 14 14   ALT 11 11 9 8   ALKPHOS 176* 184* 175* 152*  BILITOT 0.2* 0.2* 0.2* 0.2*  PROT 5.9* 5.7* 5.4* 4.9*  ALBUMIN 2.5* 2.4* 2.3* 2.1*    Recent Labs Lab 07/07/13 1200  LIPASE 21   No results found for this basename: AMMONIA,  in the last 168 hours CBC:  Recent Labs Lab 07/06/13 1800 07/07/13 0050 07/08/13 0400 07/09/13 0537 07/10/13 0430 07/11/13 0435  WBC 11.1* 12.4* 13.2*  12.2* 8.5 9.3  NEUTROABS 8.9*  --   --   --   --   --   HGB 9.8* 9.7* 8.8* 8.2* 7.7* 8.9*  HCT 29.1* 29.4* 27.2* 24.8* 23.6* 26.4*  MCV 93.9 93.9 95.1 95.0 94.0 91.7  PLT 423* 399 373 343 324 268   Cardiac Enzymes:  Recent Labs Lab 07/07/13 0050 07/07/13 0547 07/07/13 1200  TROPONINI <0.30 <0.30 <0.30   BNP (last 3 results)  Recent Labs  12/23/12 2045  PROBNP 1188.0*   CBG: No results found for this basename: GLUCAP,  in the last 168 hours  Recent Results (from the past 240 hour(s))  MRSA PCR SCREENING     Status: None   Collection Time    07/06/13 10:25 PM      Result Value Range Status   MRSA by PCR NEGATIVE  NEGATIVE Final   Comment:            The GeneXpert MRSA Assay (FDA     approved for NASAL specimens     only), is one component of a     comprehensive MRSA colonization     surveillance program. It is not     intended to diagnose MRSA     infection nor to guide or     monitor treatment for     MRSA infections.  CLOSTRIDIUM DIFFICILE BY PCR     Status: None   Collection Time    07/08/13  4:55 PM      Result Value Range Status   C difficile by pcr NEGATIVE  NEGATIVE Final  STOOL CULTURE     Status: None   Collection Time    07/08/13  4:55 PM      Result Value Range Status   Specimen Description STOOL   Final   Special Requests Normal   Final   Culture     Final   Value: Culture reincubated for better growth     Performed at [facility]   Report Status PENDING   Incomplete     Studies: No results found.  Scheduled Meds: . amiodarone  200 mg Oral Daily  . atorvastatin  40 mg Oral Daily  . carvedilol  3.125 mg Oral BID WC  . cilostazol  100 mg Oral BID  . famotidine  20 mg Oral QHS  . feeding supplement (RESOURCE BREEZE)  1 Container Oral BID BM  . pantoprazole  40 mg Oral BID AC  . risperiDONE  0.5 mg Oral BID  . sertraline  100 mg Oral Daily  . sodium chloride  3 mL Intravenous Q12H   Continuous Infusions: . sodium chloride 20  mL/hr at 07/09/13 0715    Active Problems:   CORONARY ARTERY DISEASE   Acute renal failure   Hypotension   Atrial fibrillation with RVR   Enteritis due to Norovirus   Ventricular tachycardia, paroxysmal   Dehydration  Abdominal pain, epigastric   Nausea with vomiting   Calculus of gallbladder without mention of cholecystitis or obstruction   Gastric ulcer    Time spent: 25 minutes.     Bakary Bramer  [facility] Pager [phone]. If 7PM-7AM, please contact night-coverage at [email or web address], password [facility] 07/11/2013, 8:16 AM  LOS: 5 days

## 2013-07-12 LAB — STOOL CULTURE: Special Requests: NORMAL

## 2013-07-12 LAB — CBC
HCT: 28.3 % — ABNORMAL LOW (ref 39.0–52.0)
Hemoglobin: 9.7 g/dL — ABNORMAL LOW (ref 13.0–17.0)
MCH: 31.2 pg (ref 26.0–34.0)
MCHC: 34.3 g/dL (ref 30.0–36.0)
MCV: 91 fL (ref 78.0–100.0)
Platelets: 272 10*3/uL (ref 150–400)
RBC: 3.11 MIL/uL — ABNORMAL LOW (ref 4.22–5.81)

## 2013-07-12 MED ORDER — FUROSEMIDE 20 MG PO TABS: 20.0000 mg | ORAL_TABLET | Freq: Every day | ORAL | Status: DC

## 2013-07-12 MED ORDER — AMIODARONE HCL 200 MG PO TABS: 200.0000 mg | ORAL_TABLET | Freq: Every day | ORAL | Status: DC

## 2013-07-12 MED ORDER — POTASSIUM CHLORIDE ER 20 MEQ PO TBCR: 20.0000 meq | EXTENDED_RELEASE_TABLET | Freq: Every day | ORAL | Status: DC

## 2013-07-12 MED ORDER — PANTOPRAZOLE SODIUM 40 MG PO TBEC: 40.0000 mg | DELAYED_RELEASE_TABLET | Freq: Two times a day (BID) | ORAL | Status: DC

## 2013-07-12 MED ORDER — BOOST / RESOURCE BREEZE PO LIQD: 1.0000 | Freq: Two times a day (BID) | ORAL | Status: DC

## 2013-07-12 NOTE — Progress Notes (Addendum)
Pt. And wife given discharge instructions. Questions answered IV and tele removed. Verbalized understanding. Taken out via wheelchair.

## 2013-07-12 NOTE — Progress Notes (Signed)
   CARE MANAGEMENT NOTE 07/12/2013  Patient:  ELIODORO, GULLETT   Account Number:  [id number]  Date Initiated:  07/11/2013  Documentation initiated by:  Junius Creamer  Subjective/Objective Assessment:   adm w v tach     Action/Plan:   lives w spouse, cp dr [name]   Anticipated DC Date:  07/12/2013   Anticipated DC Plan:  HOME W HOME HEALTH SERVICES      DC Planning Services  CM consult      [facility] Choice  HOME HEALTH   Choice offered to / List presented to:  C-1 Patient        HH arranged  HH-2 PT      [facility] agency  [facility]   Status of service:  Completed, signed off Medicare Important Message given?   (If response is "NO", the following Medicare IM given date fields will be blank) Date Medicare IM given:   Date Additional Medicare IM given:    Discharge Disposition:  HOME W HOME HEALTH SERVICES  Per UR Regulation:  Reviewed for med. necessity/level of care/duration of stay  If discussed at Long Length of Stay Meetings, dates discussed:    Comments:  07/12/13 17:10 CM spoke with pt to offer choice for HHPT. Pt chooses [facility].  Address and contact information verified. Referral faxed to [facility].  No other CM needs were communicated.  Freddy Jaksch, BSN, CM [phone].

## 2013-07-12 NOTE — Discharge Summary (Signed)
Physician Discharge Summary  [name] ZOX:[id number] DOB: [birth year] DOA: 07/06/2013  PCP: [name], MD  Admit date: 07/06/2013 Discharge date: 07/12/2013  Time spent: 35 minutes  Recommendations for Outpatient Follow-up:  1. Need follow up with PCP for further titration of lasix, and BP medications.  2. Needs cbc and bmet  Discharge Diagnoses:   Gastric ulcer   CORONARY ARTERY DISEASE   Acute renal failure   Hypotension   Atrial fibrillation with RVR   Enteritis due to Norovirus   Ventricular tachycardia, paroxysmal   Dehydration   Abdominal pain, epigastric   Nausea with vomiting   Calculus of gallbladder without mention of cholecystitis or obstruction     Discharge Condition: stable  Diet recommendation: Heart Healthy  Filed Weights   07/11/13 0449 07/11/13 1421 07/12/13 0737  Weight: 83.5 kg (184 lb 1.4 oz) 71.26 kg (157 lb 1.6 oz) 71.578 kg (157 lb 12.8 oz)    History of present illness:  Presented with  Last month patient was admitted with Enteritis though to be due to Norvirus. HE became severely dehydrated and went into VT and cardiac arrest. Patient REQUIRED INTUBATION and spend time in ICU . He was discharged to home last week but continued to have nausea occasional vomiting and at least one loose BM a day. Denies any fever or chills.  He reported some abdominal pain but no chest pain. Family reports sacral decubitus. Patient has severe fatigue.  Family declined PT/OT at home stating patient is too sick.    Hospital Course:  1-Acute kidney injury - secondary to dehydration. Improved with IV fluids. Cr peak to 2.4 on admission. Cr has decrease to 0.9.  - [place] normal. Resume lasix , lower dose at discharge.  2-Ventricular tachycardia, paroxysmal - continue coreg.  3-Hypotension - respond to IV fluids. Holding lasix. lactic acid WNL. Repeat BP this am 100 range. Holder parameters for coreg.  4-Enteritis due to Norovirus - persistent diarrhea although  improved. c diff negative. Stool culture pending.  5-Persistent Nausea and vomiting: resolved.  -Patient with prior history of AVMs, last endoscopy was in 2012, FOBT positive.  -Hemoglobin this morning 8.9 after one unit PRBC.  -Endoscopy 11-26: Three medium sized gastric antral ulcers. 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 Duodenitis.  -Will Hold plavix and aspirin for 1 week, discussed with Dr Russella Dar.- C. difficile negative.  -Increase Alk phosphatase. For HIDA scan 11-28. [place]: Cholelithiasis. No sonographic signs of cholecystitis, biliary  dilatation, or other acute findings.  6-Acute blood loss anemia: in setting of GI bleed. Medium size gastric ulcer. Hb stable 7-Transaminases; LFT trending down. HIDA today.  CORONARY ARTERY DISEASE - no chest pain, cardiac enzymes negative. Holding plavix and aspirin in setting of GI bleed. .  Atrial fibrillation with RVR - continue amiodarone.  Chronic systolic heart failure - daily weights. Weight stable. Resume lasix at discharge. Lower dose due to mild persistent diarrhea.    Procedures: Endoscopy 11-26;  1. Three medium sized gastric antral ulcers  2. Single, large, gastric fundus ulcer  3. 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 Duodenitis    Consultations:  Dr Russella Dar  Discharge Exam: Filed Vitals:   07/12/13 0928  BP: 109/48  Pulse: 92  Temp:   Resp:     General: no distress.  Cardiovascular: S 1, S 2 RRR Respiratory: CTA  Discharge Instructions      Discharge Orders   Future Appointments Provider Department Dept Phone   09/02/2013  10:30 AM Corwin Levins, MD [facility] [phone]   Future Orders Complete By Expires   Diet - low sodium heart healthy  As directed    Increase activity slowly  As directed        Medication List    STOP taking these medications       amLODipine 10 MG tablet  Commonly known as:  NORVASC     aspirin 81 MG  EC tablet     clopidogrel 75 MG tablet  Commonly known as:  PLAVIX     esomeprazole 40 MG capsule  Commonly known as:  NEXIUM  Replaced by:  pantoprazole 40 MG tablet     isosorbide mononitrate 60 MG 24 hr tablet  Commonly known as:  IMDUR     lisinopril 10 MG tablet  Commonly known as:  PRINIVIL,ZESTRIL      TAKE these medications       acetaminophen 500 MG tablet  Commonly known as:  TYLENOL  Take 500 mg by mouth every 6 (six) hours as needed.     amiodarone 400 MG tablet  Commonly known as:  PACERONE  Take 0.5 tablets (200 mg total) by mouth daily.     atorvastatin 40 MG tablet  Commonly known as:  LIPITOR  Take 40 mg by mouth daily.     carvedilol 3.125 MG tablet  Commonly known as:  COREG  Take 1 tablet (3.125 mg total) by mouth 2 (two) times daily with a meal.     cilostazol 100 MG tablet  Commonly known as:  PLETAL  Take 100 mg by mouth 2 (two) times daily.     feeding supplement (RESOURCE BREEZE) Liqd  Take 1 Container by mouth 2 (two) times daily between meals.     ferrous sulfate 325 (65 FE) MG tablet  Take 325 mg by mouth 3 (three) times daily.     furosemide 20 MG tablet  Commonly known as:  LASIX  Take 1 tablet (20 mg total) by mouth daily.     nitroGLYCERIN 0.4 MG SL tablet  Commonly known as:  NITROSTAT  Place 1 tablet (0.4 mg total) under the tongue every 5 (five) minutes as needed for chest pain.     ondansetron 4 MG tablet  Commonly known as:  ZOFRAN  Take 1 tablet (4 mg total) by mouth every 8 (eight) hours as needed for nausea or vomiting.     pantoprazole 40 MG tablet  Commonly known as:  PROTONIX  Take 1 tablet (40 mg total) by mouth 2 (two) times daily before a meal.     Potassium Chloride ER 20 MEQ Tbcr  Take 20 mEq by mouth daily.     ranitidine 150 MG tablet  Commonly known as:  ZANTAC  Take 1 tablet (150 mg total) by mouth at bedtime.     risperiDONE 0.5 MG tablet  Commonly known as:  RISPERDAL  Take 1 tablet (0.5 mg  total) by mouth 2 (two) times daily.     sertraline 100 MG tablet  Commonly known as:  ZOLOFT  Take 1 tablet (100 mg total) by mouth daily.       No Known Allergies    The results of significant diagnostics from this hospitalization (including imaging, microbiology, ancillary and laboratory) are listed below for reference.    Significant Diagnostic Studies: Ct Abdomen Pelvis Wo Contrast  06/17/2013   CLINICAL DATA:  Abdominal pain, weakness and confusion. Diarrhea.  EXAM: CT ABDOMEN AND  PELVIS WITHOUT CONTRAST  TECHNIQUE: Multidetector CT imaging of the abdomen and pelvis was performed following the standard protocol without intravenous contrast.  COMPARISON:  CT of the abdomen and pelvis July 23, 2008.  FINDINGS: Limited view of the lung bases demonstrated dependent atelectasis, minimal pleural thickening. Dense mitral annular calcifications, status post median sternotomy with cardiac lead in place.  CONTRAST within the included thoracic esophagus and stomach. Multiple loops of prominent small bowel in the measure up to 3.7 cm, without discrete transition point. Fluid filled cecum, large bowel is normal in course and caliber. No pericolonic inflammatory changes. Sigmoid diverticulosis  Liver, spleen, right adrenal gland are unremarkable. Relatively fatty replaced pancreas is otherwise unremarkable. Punctate layering gallstones within the gallbladder. 24 x 19 mm left adrenal nodule, 5 Hounsfield units, consistent with benign adenoma. No intraperitoneal free fluid nor free air.  Kidneys are well located. 3 mm right interpolar calculus was present previously, bilateral vascular calcifications. No new hydronephrosis. Limited assessment for renal masses on this noncontrast examination.  Severe calcific atherosclerosis aorta, with apparent bilateral renal artery stents. Bilateral common iliac artery stents. Urinary bladder is well distended unremarkable. Prostate is nonsuspicious. Patient is  osteopenic. Severe L4-5 degenerative disc disease with minimal neural foraminal narrowing.  IMPRESSION: Multiple loops of mildly prominent small bowel throughout the abdomen, with no discrete transition point, this could reflect enteritis with fluid filled cecum. No bowel obstruction. Sigmoid diverticulosis without acute diverticulitis.  Cholelithiasis without CT findings of acute cholecystitis.  Severe calcific atherosclerosis of the aorta with bilateral renal and common iliac artery stents.   Electronically Signed   By: Awilda Metro   On: 06/17/2013 06:48   Dg Chest 2 View  07/06/2013   CLINICAL DATA:  Epigastric pain for 1 week, low blood pressure  EXAM: CHEST  2 VIEW  COMPARISON:  06/25/2013  FINDINGS: Heart size mildly enlarged. Heavy aortic arch calcification. Vascular pattern normal. Mild diffuse interstitial change most prominent in the right middle lobe and left base. Unchanged appearance when compared to prior study, except for mild decrease in the overall prominence of interstitial change. Right internal jugular catheter has been removed.  IMPRESSION: Decreased interstitial conspicuity suggesting resolving interstitial pulmonary edema.   Electronically Signed   By: Esperanza Heir M.D.   On: 07/06/2013 18:50   Dg Abd 1 View  07/07/2013   CLINICAL DATA:  Increased abdominal pain, evaluate for ileus  EXAM: ABDOMEN - 1 VIEW  COMPARISON:  06/17/2013  FINDINGS: Mild nonspecific gas distention of the bowel. Negative for significant obstruction or ileus. Extensive aortoiliac calcifications noted. Bilateral renal and common iliac stents present. No acute osseous finding.  IMPRESSION: Negative for significant obstruction or ileus.   Electronically Signed   By: Ruel Favors M.D.   On: 07/07/2013 08:19   US Abdomen Complete  07/08/2013   CLINICAL DATA:  Nausea.  Abdominal pain.  EXAM: ULTRASOUND ABDOMEN COMPLETE  COMPARISON:  None.  FINDINGS: Gallbladder  Several small less than 1 cm gallstones  noted as well as minimal amount of sludge. No evidence of gallbladder wall thickening. No sonographic Murphy sign noted.  Common bile duct  Diameter: 4 mm  Liver  No focal lesion identified. Within normal limits in parenchymal echogenicity.  IVC  No abnormality visualized.  Pancreas  Visualized portion unremarkable.  Spleen  Size and appearance within normal limits.  Right Kidney  Length: 11.0 cm. Echogenicity within normal limits. No mass or hydronephrosis visualized.  Left Kidney  Length: 12.4 cm. Echogenicity within normal  limits. No mass or hydronephrosis visualized.  Abdominal aorta  No aneurysm visualized.  IMPRESSION: Cholelithiasis. No sonographic signs of cholecystitis, biliary dilatation, or other acute findings.   Electronically Signed   By: Myles Rosenthal M.D.   On: 07/08/2013 20:26   US Renal  07/07/2013   CLINICAL DATA:  Renal failure  EXAM: RENAL/URINARY TRACT ULTRASOUND COMPLETE  COMPARISON:  CT abdomen and pelvis 06/17/2013  FINDINGS: Right Kidney  Length: 10.3 cm. Normal cortical thickness and echogenicity for age. . 3 mm echogenic non shadowing focus mid right kidney corresponding the calculus on prior CT. No mass or hydronephrosis.  Left Kidney  Length: 11.5 cm. Normal cortical thickness and echogenicity for age. No mass, hydronephrosis or shadowing calcification.  Bladder  Appears normal for degree of bladder distention.  IMPRESSION: 3 mm nonobstructing mid right renal calculus.  Otherwise negative exam.   Electronically Signed   By: Ulyses Southward M.D.   On: 07/07/2013 08:48   Nm Hepato W/eject Fract  07/11/2013   CLINICAL DATA:  Pain.  Nausea and vomiting.  EXAM: NUCLEAR MEDICINE HEPATOBILIARY IMAGING WITH GALLBLADDER EF  TECHNIQUE: Sequential images of the abdomen were obtained out to 60 minutes following intravenous administration of radiopharmaceutical. After slow intravenous infusion of 1.67 micrograms Cholecystokinin, gallbladder ejection fraction was determined.  COMPARISON:   Ultrasound 07/08/2013.  RADIOPHARMACEUTICALS:  48mCiTc-15m Choletec  FINDINGS: Liver, gallbladder, biliary system, and bowel appear normal. At 30 min, normal ejection fraction is greater than 30%.  The patient did not experience symptoms during CCK infusion.  IMPRESSION: Normal exam.   Electronically Signed   By: Maisie Fus  Register   On: 07/11/2013 12:50   Dg Chest Port 1 View  06/25/2013   CLINICAL DATA:  CHF.  EXAM: PORTABLE CHEST - 1 VIEW  COMPARISON:  June 22, 2013.  FINDINGS: The lungs are well expanded. The interstitial markings remain diffusely increased. The left hemidiaphragm is slightly better demonstrated today. Persistent fibrotic type density is present in the lower 1/3 of the right lung. The cardiac silhouette remains top-normal in size. The pulmonary vascularity is mildly prominent centrally. The patient has undergone previous CABG. There is calcification in the wall of the tortuous descending thoracic aorta. The right internal jugular venous catheter tip lies in the region of the distal portion of the internal jugular vein and appears to been withdrawn somewhat since the previous study.  IMPRESSION: 1. There has not been significant interval change in the appearance of the pulmonary interstitium or cardiac silhouette since the previous study. The left hemidiaphragm is slightly better demonstrated today and hazy density in the right mid and lower lung is somewhat less conspicuous which may reflect some resolving atelectasis or interstitial edema. 2. The right internal jugular venous catheter appears to brisk been withdrawn somewhat such that its tip now arise in the region of the distal portion of the right internal jugular vein.   Electronically Signed   By: David  [place]   On: 06/25/2013 08:38   Dg Chest Port 1 View  06/22/2013   CLINICAL DATA:  Congestive heart failure  EXAM: PORTABLE CHEST - 1 VIEW  COMPARISON:  06/20/2013  FINDINGS: Cardiomegaly again noted. Status post median sternotomy.  Central mild vascular congestion and mild interstitial prominence suspicious for mild interstitial edema. Worsening atelectasis or fluid in minor fissure right midlung. Stable right IJ central line position. Atherosclerotic calcifications of thoracic aorta again noted. Mild basilar atelectasis.  IMPRESSION: Central mild vascular congestion and mild interstitial prominence suspicious for mild interstitial edema.  Worsening atelectasis or fluid in minor fissure right midlung. Stable right IJ central line position. Atherosclerotic calcifications of thoracic aorta again noted. Mild basilar atelectasis.   Electronically Signed   By: Natasha Mead M.D.   On: 06/22/2013 09:38   Portable Chest Xray In Am  06/20/2013   CLINICAL DATA:  Atelectasis  EXAM: PORTABLE CHEST - 1 VIEW  COMPARISON:  Prior radiograph from 06/19/2013  FINDINGS: Tip of a right IJ central venous catheter projects over the mid SVC. Median sternotomy wires are unchanged. Cardiomegaly is stable. Coarse atherosclerotic calcifications again noted within the aortic arch. Defibrillator pad overlies the lower left chest.  Lung volumes are within normal limits. Bilateral regular interstitial thickening is similar as compared to the prior exam. More linear opacities within the bilateral lung bases, right greater than left are most consistent with atelectasis, slightly increased as compared to the prior exam. No pneumothorax.  Osseous structures are unchanged.  IMPRESSION: Bibasilar atelectasis, right greater than left, slightly increased from prior study from 06/19/2013. Otherwise no significant interval change in appearance of the heart and lungs.   Electronically Signed   By: Rise Mu M.D.   On: 06/20/2013 06:00   Dg Chest Port 1 View  06/19/2013   CLINICAL DATA:  Status post central line placement.  EXAM: PORTABLE CHEST - 1 VIEW  COMPARISON:  06/19/2013  FINDINGS: New right internal jugular central venous line has its tip in the upper aspect of  the superior vena cava. No pneumothorax.  Endotracheal tube and nasogastric to are stable in well positioned.  Bilateral irregular interstitial thickening with right mid and lower lung zone hazy airspace opacity is stable. Changes from cardiac surgery are stable.  IMPRESSION: Right internal jugular central venous line has its tip in the upper aspect of the superior vena cava. No pneumothorax.  No other change from the earlier exam.   Electronically Signed   By: Amie Portland M.D.   On: 06/19/2013 13:24   Portable Chest Xray  06/19/2013   CLINICAL DATA:  Status post intubation of the trachea  EXAM: PORTABLE CHEST - 1 VIEW  COMPARISON:  Dec 23, 2012.  FINDINGS: The endotracheal tube tip lies approximately 2 cm below the inferior margin of the clavicular heads and approximately 4 cm above the carina. There is an esophagogastric tube present whose tip is not clearly below the hemidiaphragms. It may have coiled upon itself. Given that there is a subtle linear density projecting over the lower heart border.  The left lung appears mildly hyperinflated. On the right. Inflation is not quite is grade in their coarse lung markings suggesting fibrosis or atelectasis. The cardiac silhouette is not enlarged. The pulmonary vascularity is not engorged. There is calcification in the wall of the tortuous ascending and descending thoracic aorta.  IMPRESSION: 1. The endotracheal tube tip appears to be in reasonable position approximately 4 cm above the Carina.  2. Positioning of the esophagogastric tube is questionable. A lower thoracic, upper abdominal film would be useful to assure that the tip and proximal port of the nasogastric tube lie below the level of the GE junction.  3. Increased interstitial density at the right lung base is in part chronic but superimposed acute atelectasis or early interstitial pneumonia is not excluded.  4.  There is no evidence of CHF.   Electronically Signed   By: David  [place]   On: 06/19/2013  10:24    Microbiology: Recent Results (from the past 240 hour(s))  MRSA PCR SCREENING  Status: None   Collection Time    07/06/13 10:25 PM      Result Value Range Status   MRSA by PCR NEGATIVE  NEGATIVE Final   Comment:            The GeneXpert MRSA Assay (FDA     approved for NASAL specimens     only), is one component of a     comprehensive MRSA colonization     surveillance program. It is not     intended to diagnose MRSA     infection nor to guide or     monitor treatment for     MRSA infections.  CLOSTRIDIUM DIFFICILE BY PCR     Status: None   Collection Time    07/08/13  4:55 PM      Result Value Range Status   C difficile by pcr NEGATIVE  NEGATIVE Final  STOOL CULTURE     Status: None   Collection Time    07/08/13  4:55 PM      Result Value Range Status   Specimen Description STOOL   Final   Special Requests Normal   Final   Culture     Final   Value: Culture reincubated for better growth     Performed at [facility]   Report Status PENDING   Incomplete     Labs: Basic Metabolic Panel:  Recent Labs Lab 07/07/13 0050 07/08/13 0400 07/09/13 0537 07/10/13 0430 07/11/13 0435  NA 133* 134* 135 135 136  K 3.4* 3.7 3.6 3.1* 3.5  CL 102 102 102 103 104  CO2 19 22 23 24 22   GLUCOSE 90 69* 76 86 80  BUN 29* 24* 24* 17 12  CREATININE 1.80* 1.14 1.14 0.99 1.00  CALCIUM 8.6 8.5 8.6 8.3* 8.5  MG 1.8  --   --   --   --   PHOS 4.1  --   --   --   --    Liver Function Tests:  Recent Labs Lab 07/06/13 1800 07/07/13 0050 07/09/13 1000 07/10/13 0430  AST 13 14 14 14   ALT 11 11 9 8   ALKPHOS 176* 184* 175* 152*  BILITOT 0.2* 0.2* 0.2* 0.2*  PROT 5.9* 5.7* 5.4* 4.9*  ALBUMIN 2.5* 2.4* 2.3* 2.1*    Recent Labs Lab 07/07/13 1200  LIPASE 21   No results found for this basename: AMMONIA,  in the last 168 hours CBC:  Recent Labs Lab 07/06/13 1800 07/07/13 0050 07/08/13 0400 07/09/13 0537 07/10/13 0430 07/11/13 0435  WBC 11.1* 12.4*  13.2* 12.2* 8.5 9.3  NEUTROABS 8.9*  --   --   --   --   --   HGB 9.8* 9.7* 8.8* 8.2* 7.7* 8.9*  HCT 29.1* 29.4* 27.2* 24.8* 23.6* 26.4*  MCV 93.9 93.9 95.1 95.0 94.0 91.7  PLT 423* 399 373 343 324 268   Cardiac Enzymes:  Recent Labs Lab 07/07/13 0050 07/07/13 0547 07/07/13 1200  TROPONINI <0.30 <0.30 <0.30   BNP: BNP (last 3 results)  Recent Labs  12/23/12 2045  PROBNP 1188.0*   CBG: No results found for this basename: GLUCAP,  in the last 168 hours     Signed:  Harlie Ragle  [facility] 07/12/2013, 9:42 AM

## 2013-07-12 NOTE — Progress Notes (Signed)
Called md with hgb results. Ok to discharge.

## 2013-07-14 ENCOUNTER — Encounter (HOSPITAL_COMMUNITY): Payer: Self-pay | Admitting: Gastroenterology

## 2013-07-16 ENCOUNTER — Encounter: Payer: Self-pay | Admitting: Internal Medicine

## 2013-07-16 ENCOUNTER — Ambulatory Visit (INDEPENDENT_AMBULATORY_CARE_PROVIDER_SITE_OTHER): Payer: Medicare Other | Admitting: Internal Medicine

## 2013-07-16 VITALS — BP 98/62 | HR 110 | Temp 97.6°F | Wt 157.0 lb

## 2013-07-16 DIAGNOSIS — R443 Hallucinations, unspecified: Secondary | ICD-10-CM

## 2013-07-16 DIAGNOSIS — F411 Generalized anxiety disorder: Secondary | ICD-10-CM

## 2013-07-16 DIAGNOSIS — R112 Nausea with vomiting, unspecified: Secondary | ICD-10-CM

## 2013-07-16 MED ORDER — ALPRAZOLAM 0.25 MG PO TABS: 0.2500 mg | ORAL_TABLET | Freq: Two times a day (BID) | ORAL | Status: DC | PRN

## 2013-07-16 MED ORDER — PROMETHAZINE HCL 25 MG PO TABS: 25.0000 mg | ORAL_TABLET | Freq: Three times a day (TID) | ORAL | Status: DC | PRN

## 2013-07-16 MED ORDER — SERTRALINE HCL 100 MG PO TABS: 100.0000 mg | ORAL_TABLET | Freq: Every day | ORAL | Status: DC

## 2013-07-16 NOTE — Assessment & Plan Note (Signed)
Ok for trial off the risperdal,  to f/u any worsening symptoms or concerns

## 2013-07-16 NOTE — Progress Notes (Signed)
Subjective:    [name], male    DOB: [birth year], 78 y.o.   MRN: [id number]  HPI  Here to f/u with several family members for support, prior to planned fu next wk as family concerned about possible dehydration; since now with recurrent symptoms last 5 days post recent hosp d/c with decresed appetite, nausea, upper abd discomfort, occas vomiting, general weakness, s/p EGD with PUD, gastritis, no obstruction.  S/p 1 unit PRBC due to anemia, now for planned off asa/plavix for 1 wk.  Pt denies chest pain, increased sob or doe, wheezing, orthopnea, PND, increased LE swelling, palpitations, dizziness or syncope.  Pt denies new neurological symptoms such as new headache, or facial or extremity weakness or numbness   Pt denies polydipsia, polyuria. Hallucinations seemed to have resolved, wants to try off risperdal now, has had some anxiety recently espec with trying to eat.  Family unable to afford the [employer] recommended for nutrition.  Zofran not working as well as during hospn - requests change.  Taking the protonix now instead of nexium. Also off several meds incluidng amlodipine, isosorbide , and lisinopril. HIDA scan neg Past Medical History  Diagnosis Date  . CEREBROVASCULAR ACCIDENT, HX OF 03/02/2007  . CHEST PAIN 01/31/2010  . CHRONIC OBSTRUCTIVE PULMONARY DISEASE, ACUTE EXACERBATION 02/18/2009  . COPD 03/02/2007  . CORONARY ARTERY DISEASE 03/02/2007  . DEPRESSION 09/23/2007    anxiety  . DISEASE, PERIPHERAL VASCULAR NEC 03/02/2007  . DIVERTICULOSIS, COLON 09/23/2007  . FATIGUE 01/22/2009  . GERD 03/02/2007  . GLUCOSE INTOLERANCE 09/23/2007  . HEMORRHOIDS 10/09/2007  . HIATAL HERNIA 10/09/2007  . HYPERCHOLESTEROLEMIA 10/09/2007  . HYPERTENSION 09/23/2007  . HYPOKALEMIA 10/09/2007  . MESENTERIC VASCULAR INSUFFICIENCY 07/06/2008  . MITRAL REGURGITATION 11/27/2008  . MI 10/09/2007  . PERIPHERAL VASCULAR DISEASE 09/23/2007  . PUD 10/09/2007  . RENAL ARTERY STENOSIS 03/02/2007  . TOBACCO  ABUSE 04/20/2010  . 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 URINARY RETENTION 01/22/2009  . VITAMIN B12 DEFICIENCY 01/03/2010  . WRIST PAIN, RIGHT 06/06/2010  . Impaired glucose tolerance 01/29/2011  . Hearing loss in left ear 01/30/2011  . Blindness of both eyes 01/30/2011  . Hyperlipidemia   . Anemia, iron deficiency   . Atrial flutter   . HTN (hypertension) 03/21/2011  . AVM (arteriovenous malformation) 09/2010    stomach and duodenum  . PUD (peptic ulcer disease) 09/2010    pyloric ulcer   Past Surgical History  Procedure Laterality Date  . Coronary artery bypass graft  1995    LIMA-LAD, SVG-OM, SVG-D, SVG-PDA  . Renal artery stenting  2004    bilateral  . S/p multiple le vascular bypass      includine fem-pop x 2 LLE  . S/p bilateral cea    . Coronary angioplasty with stent placement  2010    DES x 2 SVG-OM, cutting balloon PTCA SVG-PDA for ISR  . Appendectomy    . S/p lumbar disc surgury      x2  . S/p rle fem bypass feb 2011    . Cardiac catheterization    . Pr vein bypass graft,aorto-fem-pop    . Carotid endarterectomy    . Coronary angioplasty with stent placement  2011    DES to SVG-DIAG, SVG-PDA  . Esophagogastroduodenoscopy    . Colonoscopy    . Esophagogastroduodenoscopy N/A 07/09/2013    Procedure: ESOPHAGOGASTRODUODENOSCOPY (EGD);  Surgeon: Meryl Dare, MD;  Location: [facility] ENDOSCOPY;  Service: Endoscopy;  Laterality: N/A;  reports that he has been smoking Cigarettes.  He has a 60 pack-year smoking history. He has never used smokeless tobacco. He reports that he does not drink alcohol or use illicit drugs. family history includes Cancer in his father; Colon cancer (age of onset: 31) in his mother; Heart disease in his father and sister; Prostate cancer in his brother; Stroke in his sister. No Known Allergies Current Outpatient Prescriptions on File Prior to Visit  Medication Sig Dispense Refill  . acetaminophen (TYLENOL) 500 MG tablet Take 500 mg by  mouth every 6 (six) hours as needed.      Marland Kitchen atorvastatin (LIPITOR) 40 MG tablet Take 40 mg by mouth daily.      . carvedilol (COREG) 3.125 MG tablet Take 1 tablet (3.125 mg total) by mouth 2 (two) times daily with a meal.      . cilostazol (PLETAL) 100 MG tablet Take 100 mg by mouth 2 (two) times daily.      . feeding supplement, RESOURCE BREEZE, (RESOURCE BREEZE) LIQD Take 1 Container by mouth 2 (two) times daily between meals.  30 Container  0  . ferrous sulfate 325 (65 FE) MG tablet Take 325 mg by mouth 3 (three) times daily.        . furosemide (LASIX) 20 MG tablet Take 1 tablet (20 mg total) by mouth daily.  30 tablet  0  . nitroGLYCERIN (NITROSTAT) 0.4 MG SL tablet Place 1 tablet (0.4 mg total) under the tongue every 5 (five) minutes as needed for chest pain.  30 tablet  0  . ondansetron (ZOFRAN) 4 MG tablet Take 1 tablet (4 mg total) by mouth every 8 (eight) hours as needed for nausea or vomiting.  40 tablet  1  . pantoprazole (PROTONIX) 40 MG tablet Take 1 tablet (40 mg total) by mouth 2 (two) times daily before a meal.  60 tablet  0  . potassium chloride 20 MEQ TBCR Take 20 mEq by mouth daily.  20 tablet  0  . ranitidine (ZANTAC) 150 MG tablet Take 1 tablet (150 mg total) by mouth at bedtime.  90 tablet  3  . risperiDONE (RISPERDAL) 0.5 MG tablet Take 1 tablet (0.5 mg total) by mouth 2 (two) times daily.  90 tablet  1  . amiodarone (PACERONE) 400 MG tablet Take 0.5 tablets (200 mg total) by mouth daily.  14 tablet  0   No current facility-administered medications on file prior to visit.    Review of Systems  Constitutional: Negative for unexpected weight change, or unusual diaphoresis  HENT: Negative for tinnitus.   Eyes: Negative for photophobia and visual disturbance.  Respiratory: Negative for choking and stridor.   Gastrointestinal: Negative  blood in stool.  Genitourinary: Negative for hematuria .  Musculoskeletal: Negative for acute joint swelling Skin: Negative for color  change and wound.  Neurological: Negative for tremors and numbness other than noted  Psychiatric/Behavioral: Negative for decreased concentration or  hyperactivity.       Objective:   Physical Exam BP 98/62  Pulse 110  Temp(Src) 97.6 F (36.4 C) (Oral)  Wt 157 lb (71.215 kg)  SpO2 93% VS noted, fatigued, but pharynx not dry, has oral secretions Constitutional: Pt appears well-developed and well-nourished.  HENT: Head: NCAT.  Right Ear: External ear normal.  Left Ear: External ear normal.  Eyes: Conjunctivae and EOM are normal. Pupils are equal, round, and reactive to light.  Neck: Normal range of motion. Neck supple.  Cardiovascular: Normal rate and regular rhythm.  Pulmonary/Chest: Effort normal and breath sounds normal.  Abd:  Soft,  non-distended, + BS, mild epigastric tender Neurological: Pt is alert. Not confused  Skin: Skin is warm. No erythema.  Psychiatric: Pt behavior is normal. Thought content normal. 1+ nervous, ? Depressed affect    Assessment & Plan:

## 2013-07-16 NOTE — Assessment & Plan Note (Signed)
Ok for xanax low dose prn,  to f/u any worsening symptoms or concerns

## 2013-07-16 NOTE — Progress Notes (Signed)
Pre-visit discussion using our clinic review tool. No additional management support is needed unless otherwise documented below in the visit note.  

## 2013-07-16 NOTE — Assessment & Plan Note (Addendum)
No obstruction, HIDA scan neg, likely due to PUD/gastritis, at risk for further dehydration, will cont PPI - protonic, try phenergan prn, sips fluids as able, resource breeze as able for nutrition  Note:  Total time for pt hx, exam, review of record with pt in the room, determination of diagnoses and plan for further eval and tx is > 40 min, with over 50% spent in coordination and counseling of patient

## 2013-07-16 NOTE — Patient Instructions (Addendum)
Ok to try holding off on the Risperdal to see if his symptoms remain stable Please take all new medication as prescribed - the xanax as needed OK to stop the zofran, and try phenergan for nausea Please continue all other medications as before, and refills have been done if requested. Please have the pharmacy call with any other refills you may need.  Please remember to sign up for My Chart if you have not done so, as this will be important to you in the future with finding out test results, communicating by private email, and scheduling acute appointments online when needed.

## 2013-07-17 ENCOUNTER — Other Ambulatory Visit: Payer: Self-pay | Admitting: Internal Medicine

## 2013-07-18 ENCOUNTER — Encounter (HOSPITAL_COMMUNITY): Payer: Self-pay | Admitting: Emergency Medicine

## 2013-07-18 ENCOUNTER — Inpatient Hospital Stay (HOSPITAL_COMMUNITY)
Admission: EM | Admit: 2013-07-18 | Discharge: 2013-08-13 | DRG: 853 | Disposition: A | Discharge status: Other Acute Inpatient Hospital | Payer: Medicare Other | Attending: Internal Medicine | Admitting: Internal Medicine

## 2013-07-18 ENCOUNTER — Emergency Department (HOSPITAL_COMMUNITY): Payer: Medicare Other

## 2013-07-18 ENCOUNTER — Telehealth: Payer: Self-pay

## 2013-07-18 DIAGNOSIS — Z8673 Personal history of transient ischemic attack (TIA), and cerebral infarction without residual deficits: Secondary | ICD-10-CM

## 2013-07-18 DIAGNOSIS — R627 Adult failure to thrive: Secondary | ICD-10-CM | POA: Diagnosis present

## 2013-07-18 DIAGNOSIS — E785 Hyperlipidemia, unspecified: Secondary | ICD-10-CM | POA: Diagnosis present

## 2013-07-18 DIAGNOSIS — Z48812 Encounter for surgical aftercare following surgery on the circulatory system: Secondary | ICD-10-CM

## 2013-07-18 DIAGNOSIS — E43 Unspecified severe protein-calorie malnutrition: Secondary | ICD-10-CM

## 2013-07-18 DIAGNOSIS — R079 Chest pain, unspecified: Secondary | ICD-10-CM

## 2013-07-18 DIAGNOSIS — Z681 Body mass index (BMI) 19 or less, adult: Secondary | ICD-10-CM

## 2013-07-18 DIAGNOSIS — H918X9 Other specified hearing loss, unspecified ear: Secondary | ICD-10-CM

## 2013-07-18 DIAGNOSIS — Z79899 Other long term (current) drug therapy: Secondary | ICD-10-CM

## 2013-07-18 DIAGNOSIS — K5289 Other specified noninfective gastroenteritis and colitis: Secondary | ICD-10-CM | POA: Diagnosis present

## 2013-07-18 DIAGNOSIS — I5043 Acute on chronic combined systolic (congestive) and diastolic (congestive) heart failure: Secondary | ICD-10-CM

## 2013-07-18 DIAGNOSIS — Z0000 Encounter for general adult medical examination without abnormal findings: Secondary | ICD-10-CM

## 2013-07-18 DIAGNOSIS — I252 Old myocardial infarction: Secondary | ICD-10-CM

## 2013-07-18 DIAGNOSIS — I2589 Other forms of chronic ischemic heart disease: Secondary | ICD-10-CM | POA: Diagnosis present

## 2013-07-18 DIAGNOSIS — R5381 Other malaise: Secondary | ICD-10-CM | POA: Diagnosis present

## 2013-07-18 DIAGNOSIS — K912 Postsurgical malabsorption, not elsewhere classified: Secondary | ICD-10-CM | POA: Diagnosis not present

## 2013-07-18 DIAGNOSIS — R578 Other shock: Secondary | ICD-10-CM | POA: Diagnosis present

## 2013-07-18 DIAGNOSIS — I219 Acute myocardial infarction, unspecified: Secondary | ICD-10-CM

## 2013-07-18 DIAGNOSIS — I739 Peripheral vascular disease, unspecified: Secondary | ICD-10-CM

## 2013-07-18 DIAGNOSIS — I743 Embolism and thrombosis of arteries of the lower extremities: Secondary | ICD-10-CM | POA: Diagnosis present

## 2013-07-18 DIAGNOSIS — I4891 Unspecified atrial fibrillation: Secondary | ICD-10-CM

## 2013-07-18 DIAGNOSIS — E874 Mixed disorder of acid-base balance: Secondary | ICD-10-CM | POA: Diagnosis not present

## 2013-07-18 DIAGNOSIS — K659 Peritonitis, unspecified: Secondary | ICD-10-CM | POA: Diagnosis present

## 2013-07-18 DIAGNOSIS — Z860100 Personal history of colon polyps, unspecified: Secondary | ICD-10-CM

## 2013-07-18 DIAGNOSIS — Z66 Do not resuscitate: Secondary | ICD-10-CM | POA: Diagnosis not present

## 2013-07-18 DIAGNOSIS — E876 Hypokalemia: Secondary | ICD-10-CM

## 2013-07-18 DIAGNOSIS — K2980 Duodenitis without bleeding: Secondary | ICD-10-CM | POA: Diagnosis present

## 2013-07-18 DIAGNOSIS — E875 Hyperkalemia: Secondary | ICD-10-CM | POA: Diagnosis not present

## 2013-07-18 DIAGNOSIS — I708 Atherosclerosis of other arteries: Secondary | ICD-10-CM | POA: Diagnosis present

## 2013-07-18 DIAGNOSIS — K259 Gastric ulcer, unspecified as acute or chronic, without hemorrhage or perforation: Secondary | ICD-10-CM

## 2013-07-18 DIAGNOSIS — K2960 Other gastritis without bleeding: Secondary | ICD-10-CM

## 2013-07-18 DIAGNOSIS — J4489 Other specified chronic obstructive pulmonary disease: Secondary | ICD-10-CM

## 2013-07-18 DIAGNOSIS — K55059 Acute (reversible) ischemia of intestine, part and extent unspecified: Secondary | ICD-10-CM

## 2013-07-18 DIAGNOSIS — E538 Deficiency of other specified B group vitamins: Secondary | ICD-10-CM

## 2013-07-18 DIAGNOSIS — R7401 Elevation of levels of liver transaminase levels: Secondary | ICD-10-CM

## 2013-07-18 DIAGNOSIS — A419 Sepsis, unspecified organism: Principal | ICD-10-CM

## 2013-07-18 DIAGNOSIS — K649 Unspecified hemorrhoids: Secondary | ICD-10-CM

## 2013-07-18 DIAGNOSIS — R531 Weakness: Secondary | ICD-10-CM

## 2013-07-18 DIAGNOSIS — N179 Acute kidney failure, unspecified: Secondary | ICD-10-CM

## 2013-07-18 DIAGNOSIS — Z951 Presence of aortocoronary bypass graft: Secondary | ICD-10-CM

## 2013-07-18 DIAGNOSIS — J449 Chronic obstructive pulmonary disease, unspecified: Secondary | ICD-10-CM

## 2013-07-18 DIAGNOSIS — K5730 Diverticulosis of large intestine without perforation or abscess without bleeding: Secondary | ICD-10-CM

## 2013-07-18 DIAGNOSIS — H9192 Unspecified hearing loss, left ear: Secondary | ICD-10-CM

## 2013-07-18 DIAGNOSIS — F411 Generalized anxiety disorder: Secondary | ICD-10-CM

## 2013-07-18 DIAGNOSIS — G629 Polyneuropathy, unspecified: Secondary | ICD-10-CM

## 2013-07-18 DIAGNOSIS — I472 Ventricular tachycardia, unspecified: Secondary | ICD-10-CM

## 2013-07-18 DIAGNOSIS — R112 Nausea with vomiting, unspecified: Secondary | ICD-10-CM

## 2013-07-18 DIAGNOSIS — F329 Major depressive disorder, single episode, unspecified: Secondary | ICD-10-CM | POA: Diagnosis present

## 2013-07-18 DIAGNOSIS — E872 Acidosis, unspecified: Secondary | ICD-10-CM

## 2013-07-18 DIAGNOSIS — I080 Rheumatic disorders of both mitral and aortic valves: Secondary | ICD-10-CM

## 2013-07-18 DIAGNOSIS — E7800 Pure hypercholesterolemia, unspecified: Secondary | ICD-10-CM

## 2013-07-18 DIAGNOSIS — R7302 Impaired glucose tolerance (oral): Secondary | ICD-10-CM

## 2013-07-18 DIAGNOSIS — I5020 Unspecified systolic (congestive) heart failure: Secondary | ICD-10-CM | POA: Diagnosis present

## 2013-07-18 DIAGNOSIS — I701 Atherosclerosis of renal artery: Secondary | ICD-10-CM

## 2013-07-18 DIAGNOSIS — A0811 Acute gastroenteropathy due to Norwalk agent: Secondary | ICD-10-CM

## 2013-07-18 DIAGNOSIS — K8020 Calculus of gallbladder without cholecystitis without obstruction: Secondary | ICD-10-CM

## 2013-07-18 DIAGNOSIS — J9600 Acute respiratory failure, unspecified whether with hypoxia or hypercapnia: Secondary | ICD-10-CM

## 2013-07-18 DIAGNOSIS — K559 Vascular disorder of intestine, unspecified: Secondary | ICD-10-CM

## 2013-07-18 DIAGNOSIS — J309 Allergic rhinitis, unspecified: Secondary | ICD-10-CM

## 2013-07-18 DIAGNOSIS — D65 Disseminated intravascular coagulation [defibrination syndrome]: Secondary | ICD-10-CM | POA: Diagnosis not present

## 2013-07-18 DIAGNOSIS — I5022 Chronic systolic (congestive) heart failure: Secondary | ICD-10-CM | POA: Diagnosis present

## 2013-07-18 DIAGNOSIS — I059 Rheumatic mitral valve disease, unspecified: Secondary | ICD-10-CM | POA: Diagnosis present

## 2013-07-18 DIAGNOSIS — K560 Paralytic ileus: Secondary | ICD-10-CM | POA: Diagnosis not present

## 2013-07-18 DIAGNOSIS — K279 Peptic ulcer, site unspecified, unspecified as acute or chronic, without hemorrhage or perforation: Secondary | ICD-10-CM

## 2013-07-18 DIAGNOSIS — F17200 Nicotine dependence, unspecified, uncomplicated: Secondary | ICD-10-CM

## 2013-07-18 DIAGNOSIS — R933 Abnormal findings on diagnostic imaging of other parts of digestive tract: Secondary | ICD-10-CM

## 2013-07-18 DIAGNOSIS — E860 Dehydration: Secondary | ICD-10-CM

## 2013-07-18 DIAGNOSIS — I4510 Unspecified right bundle-branch block: Secondary | ICD-10-CM

## 2013-07-18 DIAGNOSIS — D509 Iron deficiency anemia, unspecified: Secondary | ICD-10-CM

## 2013-07-18 DIAGNOSIS — R109 Unspecified abdominal pain: Secondary | ICD-10-CM

## 2013-07-18 DIAGNOSIS — E162 Hypoglycemia, unspecified: Secondary | ICD-10-CM | POA: Diagnosis not present

## 2013-07-18 DIAGNOSIS — K3184 Gastroparesis: Secondary | ICD-10-CM

## 2013-07-18 DIAGNOSIS — R571 Hypovolemic shock: Secondary | ICD-10-CM | POA: Diagnosis present

## 2013-07-18 DIAGNOSIS — J155 Pneumonia due to Escherichia coli: Secondary | ICD-10-CM

## 2013-07-18 DIAGNOSIS — K7200 Acute and subacute hepatic failure without coma: Secondary | ICD-10-CM | POA: Diagnosis present

## 2013-07-18 DIAGNOSIS — I469 Cardiac arrest, cause unspecified: Secondary | ICD-10-CM

## 2013-07-18 DIAGNOSIS — I214 Non-ST elevation (NSTEMI) myocardial infarction: Secondary | ICD-10-CM | POA: Diagnosis not present

## 2013-07-18 DIAGNOSIS — Z8679 Personal history of other diseases of the circulatory system: Secondary | ICD-10-CM

## 2013-07-18 DIAGNOSIS — F3289 Other specified depressive episodes: Secondary | ICD-10-CM

## 2013-07-18 DIAGNOSIS — R443 Hallucinations, unspecified: Secondary | ICD-10-CM

## 2013-07-18 DIAGNOSIS — N320 Bladder-neck obstruction: Secondary | ICD-10-CM

## 2013-07-18 DIAGNOSIS — J9601 Acute respiratory failure with hypoxia: Secondary | ICD-10-CM

## 2013-07-18 DIAGNOSIS — K551 Chronic vascular disorders of intestine: Secondary | ICD-10-CM

## 2013-07-18 DIAGNOSIS — D696 Thrombocytopenia, unspecified: Secondary | ICD-10-CM | POA: Diagnosis not present

## 2013-07-18 DIAGNOSIS — M25539 Pain in unspecified wrist: Secondary | ICD-10-CM

## 2013-07-18 DIAGNOSIS — K311 Adult hypertrophic pyloric stenosis: Secondary | ICD-10-CM

## 2013-07-18 DIAGNOSIS — K449 Diaphragmatic hernia without obstruction or gangrene: Secondary | ICD-10-CM

## 2013-07-18 DIAGNOSIS — I959 Hypotension, unspecified: Secondary | ICD-10-CM

## 2013-07-18 DIAGNOSIS — Z8711 Personal history of peptic ulcer disease: Secondary | ICD-10-CM

## 2013-07-18 DIAGNOSIS — R1013 Epigastric pain: Secondary | ICD-10-CM

## 2013-07-18 DIAGNOSIS — Z515 Encounter for palliative care: Secondary | ICD-10-CM

## 2013-07-18 DIAGNOSIS — K315 Obstruction of duodenum: Secondary | ICD-10-CM | POA: Diagnosis present

## 2013-07-18 DIAGNOSIS — I10 Essential (primary) hypertension: Secondary | ICD-10-CM

## 2013-07-18 DIAGNOSIS — H543 Unqualified visual loss, both eyes: Secondary | ICD-10-CM

## 2013-07-18 DIAGNOSIS — R791 Abnormal coagulation profile: Secondary | ICD-10-CM | POA: Diagnosis not present

## 2013-07-18 DIAGNOSIS — I2510 Atherosclerotic heart disease of native coronary artery without angina pectoris: Secondary | ICD-10-CM

## 2013-07-18 DIAGNOSIS — K219 Gastro-esophageal reflux disease without esophagitis: Secondary | ICD-10-CM

## 2013-07-18 DIAGNOSIS — I7389 Other specified peripheral vascular diseases: Secondary | ICD-10-CM

## 2013-07-18 DIAGNOSIS — I509 Heart failure, unspecified: Secondary | ICD-10-CM | POA: Diagnosis present

## 2013-07-18 DIAGNOSIS — M542 Cervicalgia: Secondary | ICD-10-CM

## 2013-07-18 LAB — COMPREHENSIVE METABOLIC PANEL
ALT: 11 U/L (ref 0–53)
Alkaline Phosphatase: 182 U/L — ABNORMAL HIGH (ref 39–117)
CO2: 24 mEq/L (ref 19–32)
Calcium: 8.7 mg/dL (ref 8.4–10.5)
Creatinine, Ser: 0.93 mg/dL (ref 0.50–1.35)
GFR calc Af Amer: >90 mL/min (ref >90)
GFR calc non Af Amer: 80 mL/min — ABNORMAL LOW (ref >90)
Glucose, Bld: 90 mg/dL (ref 70–99)
Sodium: 136 mEq/L (ref 135–145)

## 2013-07-18 LAB — CBC
HCT: 31.4 % — ABNORMAL LOW (ref 39.0–52.0)
Hemoglobin: 10.5 g/dL — ABNORMAL LOW (ref 13.0–17.0)
MCH: 30.8 pg (ref 26.0–34.0)
MCHC: 33.4 g/dL (ref 30.0–36.0)
RBC: 3.41 MIL/uL — ABNORMAL LOW (ref 4.22–5.81)

## 2013-07-18 LAB — URINALYSIS, DIPSTICK ONLY
Nitrite: NEGATIVE
Protein, ur: 30 mg/dL — AB
Specific Gravity, Urine: 1.043 — ABNORMAL HIGH (ref 1.005–1.030)
Urobilinogen, UA: 0.2 mg/dL (ref 0.0–1.0)

## 2013-07-18 LAB — POCT I-STAT 3, VENOUS BLOOD GAS (G3P V)
Bicarbonate: 25 mEq/L — ABNORMAL HIGH (ref 20.0–24.0)
O2 Saturation: 85 %
pO2, Ven: 51 mmHg — ABNORMAL HIGH (ref 30.0–45.0)

## 2013-07-18 LAB — POCT I-STAT TROPONIN I

## 2013-07-18 LAB — POCT I-STAT, CHEM 8
Calcium, Ion: 1.21 mmol/L (ref 1.13–1.30)
Glucose, Bld: 98 mg/dL (ref 70–99)
HCT: 32 % — ABNORMAL LOW (ref 39.0–52.0)
Hemoglobin: 10.9 g/dL — ABNORMAL LOW (ref 13.0–17.0)
Potassium: 3.4 mEq/L — ABNORMAL LOW (ref 3.5–5.1)
Sodium: 138 mEq/L (ref 135–145)
TCO2: 24 mmol/L (ref 0–100)

## 2013-07-18 LAB — CG4 I-STAT (LACTIC ACID): Lactic Acid, Venous: 1.36 mmol/L (ref 0.5–2.2)

## 2013-07-18 LAB — LACTIC ACID, PLASMA: Lactic Acid, Venous: 1.3 mmol/L (ref 0.5–2.2)

## 2013-07-18 MED ORDER — IOHEXOL 300 MG/ML  SOLN
25.0000 mL | INTRAMUSCULAR | Status: AC
  Administered 2013-07-18: 25 mL via ORAL

## 2013-07-18 MED ORDER — IOHEXOL 300 MG/ML  SOLN
100.0000 mL | Freq: Once | INTRAMUSCULAR | Status: AC | PRN
  Administered 2013-07-18: 100 mL via INTRAVENOUS

## 2013-07-18 MED ORDER — ONDANSETRON HCL 4 MG/2ML IJ SOLN
4.0000 mg | Freq: Four times a day (QID) | INTRAMUSCULAR | Status: DC | PRN
  Administered 2013-07-19 – 2013-07-20 (×2): 4 mg via INTRAVENOUS
  Filled 2013-07-18 (×3): qty 2

## 2013-07-18 MED ORDER — ONDANSETRON HCL 4 MG/2ML IJ SOLN
4.0000 mg | Freq: Once | INTRAMUSCULAR | Status: AC
  Administered 2013-07-18: 4 mg via INTRAVENOUS
  Filled 2013-07-18: qty 2

## 2013-07-18 NOTE — ED Notes (Signed)
Pt finished drinking contrast Ct notified

## 2013-07-18 NOTE — ED Notes (Signed)
Pt given ice chips per verbal okay of Dr Adela Glimpse.

## 2013-07-18 NOTE — ED Notes (Signed)
Per EMS- pt woke with nausea and 2 episodes of vomitting today. Pt was recently admitted for ulcers and dc on Saturday. Pt denies pain. Cardiac hx. Pt took phenegran at home with minimal relief.

## 2013-07-18 NOTE — ED Provider Notes (Signed)
Patient seen and examined, has no significant abdominal tenderness on my exam however he has had persistent abdominal pain, vomiting and a CT scan consistent with a possible proximal small bowel stricture related to inflammatory or other changes. Discussed with hospitalist who will admit.  Vida Roller, MD 07/18/13 2040

## 2013-07-18 NOTE — ED Provider Notes (Signed)
CSN: [id number]     Arrival date & time 07/18/13  1353 History   First MD Initiated Contact with Patient 07/18/13 1436     Chief Complaint  Patient presents with  . Emesis   (Consider location/radiation/quality/duration/timing/severity/associated sxs/prior Treatment) HPI Comments: 78 yo wm with h/o COPD, Stroke, CAD, diverticulitis, fatigue, Mesenteric ischemia, PVD, RAS, HTN, Aflutter, PUD.  Pt was diagnosed with Norovirus approx 1 month ago --> multiple visits associated with this. Hospital stay for 2 weeks.    Most recent visit for dehydration.  Recently admitted and d/c'ed 6 days ago.  Since d/c pt's symptoms have worsened.    Pt's code status is full code.    CT from November: IMPRESSION: Multiple loops of mildly prominent small bowel throughout the abdomen, with no discrete transition point, this could reflect enteritis with fluid filled cecum. No bowel obstruction. Sigmoid diverticulosis without acute diverticulitis.   Cholelithiasis without CT findings of acute cholecystitis.  Severe calcific atherosclerosis of the aorta with bilateral renal and common iliac artery stents.   Electronically Signed   By: Awilda Metro   On: 06/17/2013 06:48    Patient is a 78 y.o. male presenting with vomiting. The history is provided by the patient.  Emesis Severity:  Moderate Duration:  6 days Timing:  Constant Number of daily episodes:  Multiple episodes of vomiting.   Quality:  Bilious material Feeding tolerance: vomiting after every meal. How soon after eating does vomiting occur:  1 hour Progression:  Worsening Chronicity:  Recurrent Ineffective treatments: zofran for 3 days replaced by phenergan on Wednesday.   Associated symptoms: abdominal pain   Associated symptoms: no arthralgias, no chills, no cough, no diarrhea, no fever, no headaches, no myalgias, no sore throat and no URI   Associated symptoms comment:  Loose stools reported.     Past Medical History   Diagnosis Date  . CEREBROVASCULAR ACCIDENT, HX OF 03/02/2007  . CHEST PAIN 01/31/2010  . CHRONIC OBSTRUCTIVE PULMONARY DISEASE, ACUTE EXACERBATION 02/18/2009  . COPD 03/02/2007  . CORONARY ARTERY DISEASE 03/02/2007  . DEPRESSION 09/23/2007    anxiety  . DISEASE, PERIPHERAL VASCULAR NEC 03/02/2007  . DIVERTICULOSIS, COLON 09/23/2007  . FATIGUE 01/22/2009  . GERD 03/02/2007  . GLUCOSE INTOLERANCE 09/23/2007  . HEMORRHOIDS 10/09/2007  . HIATAL HERNIA 10/09/2007  . HYPERCHOLESTEROLEMIA 10/09/2007  . HYPERTENSION 09/23/2007  . HYPOKALEMIA 10/09/2007  . MESENTERIC VASCULAR INSUFFICIENCY 07/06/2008  . MITRAL REGURGITATION 11/27/2008  . MI 10/09/2007  . PERIPHERAL VASCULAR DISEASE 09/23/2007  . PUD 10/09/2007  . RENAL ARTERY STENOSIS 03/02/2007  . TOBACCO ABUSE 04/20/2010  . 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 URINARY RETENTION 01/22/2009  . VITAMIN B12 DEFICIENCY 01/03/2010  . WRIST PAIN, RIGHT 06/06/2010  . Impaired glucose tolerance 01/29/2011  . Hearing loss in left ear 01/30/2011  . Blindness of both eyes 01/30/2011  . Hyperlipidemia   . Anemia, iron deficiency   . Atrial flutter   . HTN (hypertension) 03/21/2011  . AVM (arteriovenous malformation) 09/2010    stomach and duodenum  . PUD (peptic ulcer disease) 09/2010    pyloric ulcer   Past Surgical History  Procedure Laterality Date  . Coronary artery bypass graft  1995    LIMA-LAD, SVG-OM, SVG-D, SVG-PDA  . Renal artery stenting  2004    bilateral  . S/p multiple le vascular bypass      includine fem-pop x 2 LLE  . S/p bilateral cea    . Coronary angioplasty with stent placement  2010  DES x 2 SVG-OM, cutting balloon PTCA SVG-PDA for ISR  . Appendectomy    . S/p lumbar disc surgury      x2  . S/p rle fem bypass feb 2011    . Cardiac catheterization    . Pr vein bypass graft,aorto-fem-pop    . Carotid endarterectomy    . Coronary angioplasty with stent placement  2011    DES to SVG-DIAG, SVG-PDA  . Esophagogastroduodenoscopy     . Colonoscopy    . Esophagogastroduodenoscopy N/A 07/09/2013    Procedure: ESOPHAGOGASTRODUODENOSCOPY (EGD);  Surgeon: Meryl Dare, MD;  Location: [facility] ENDOSCOPY;  Service: Endoscopy;  Laterality: N/A;   Family History  Problem Relation Age of Onset  . Colon cancer Mother 69  . Heart disease Father   . Cancer Father   . Heart disease Sister   . Prostate cancer Brother     prostate cancer  . Stroke Sister    History  Substance Use Topics  . Smoking status: Current Every Day Smoker -- 1.00 packs/day for 60 years    Types: Cigarettes  . Smokeless tobacco: Never Used     Comment: 1 ppd  . Alcohol Use: No    Review of Systems  Constitutional: Positive for fatigue. Negative for fever and chills.  HENT: Negative.  Negative for sore throat.   Eyes: Negative.   Respiratory: Negative.   Cardiovascular: Negative.   Gastrointestinal: Positive for nausea, vomiting and abdominal pain. Negative for diarrhea, constipation, blood in stool, abdominal distention and anal bleeding.  Genitourinary: Negative.   Musculoskeletal: Negative for arthralgias and myalgias.  Skin: Negative.   Neurological: Positive for weakness. Negative for tremors, seizures, syncope, speech difficulty, light-headedness, numbness and headaches.    Allergies  Review of patient's allergies indicates no known allergies.  Home Medications   Current Outpatient Rx  Name  Route  Sig  Dispense  Refill  . acetaminophen (TYLENOL) 500 MG tablet   Oral   Take 500 mg by mouth every 6 (six) hours as needed for moderate pain.          Marland Kitchen ALPRAZolam (XANAX) 0.25 MG tablet   Oral   Take 1 tablet (0.25 mg total) by mouth 2 (two) times daily as needed for anxiety.   60 tablet   0   . amiodarone (PACERONE) 400 MG tablet   Oral   Take 200 mg by mouth daily.         Marland Kitchen atorvastatin (LIPITOR) 40 MG tablet   Oral   Take 40 mg by mouth daily.         . carvedilol (COREG) 3.125 MG tablet   Oral   Take 1 tablet (3.125  mg total) by mouth 2 (two) times daily with a meal.         . cilostazol (PLETAL) 100 MG tablet   Oral   Take 100 mg by mouth 2 (two) times daily.         . ferrous sulfate 325 (65 FE) MG tablet   Oral   Take 325 mg by mouth 2 (two) times daily with a meal.          . furosemide (LASIX) 20 MG tablet   Oral   Take 1 tablet (20 mg total) by mouth daily.   30 tablet   0   . nitroGLYCERIN (NITROSTAT) 0.4 MG SL tablet   Sublingual   Place 1 tablet (0.4 mg total) under the tongue every 5 (five) minutes as needed for chest  pain.   30 tablet   0   . pantoprazole (PROTONIX) 40 MG tablet   Oral   Take 1 tablet (40 mg total) by mouth 2 (two) times daily before a meal.   60 tablet   0   . potassium chloride 20 MEQ TBCR   Oral   Take 20 mEq by mouth daily.   20 tablet   0   . promethazine (PHENERGAN) 25 MG tablet   Oral   Take 1 tablet (25 mg total) by mouth every 8 (eight) hours as needed for nausea or vomiting.   30 tablet   2   . ranitidine (ZANTAC) 150 MG tablet   Oral   Take 1 tablet (150 mg total) by mouth at bedtime.   90 tablet   3   . sertraline (ZOLOFT) 100 MG tablet   Oral   Take 1 tablet (100 mg total) by mouth daily.   90 tablet   3   . feeding supplement, RESOURCE BREEZE, (RESOURCE BREEZE) LIQD   Oral   Take 1 Container by mouth 2 (two) times daily between meals.   30 Container   0    BP 145/63  Pulse 105  Temp(Src) 97.7 F (36.5 C) (Oral)  Resp 24  Ht 6' (1.829 m)  Wt 157 lb (71.215 kg)  BMI 21.29 kg/m2  SpO2 97% Physical Exam  Nursing note and vitals reviewed. Constitutional: He is oriented to person, place, and time. He appears cachectic. He has a sickly appearance. He appears ill. No distress.  HENT:  Head: Normocephalic and atraumatic.  Eyes: Conjunctivae are normal. Right eye exhibits no discharge. Left eye exhibits no discharge.  Neck: Normal range of motion. Neck supple.  Cardiovascular: Normal rate and regular rhythm.  Exam  reveals no friction rub.   No murmur heard. Pulmonary/Chest: Effort normal and breath sounds normal. He has no wheezes. He has no rales.  Abdominal: Soft. He exhibits no mass. There is no splenomegaly or hepatomegaly. There is generalized tenderness. There is no rigidity, no rebound, no guarding, no CVA tenderness, no tenderness at McBurney's point and negative Murphy's sign. No hernia.  Genitourinary: Testes normal and penis normal. Cremasteric reflex is present. Right testis shows no mass and no tenderness. Left testis shows no mass and no swelling. Circumcised.  Musculoskeletal: Normal range of motion. He exhibits no edema and no tenderness.  Neurological: He is alert and oriented to person, place, and time.  Skin: Skin is warm and dry. He is not diaphoretic.    ED Course  Procedures (including critical care time) Labs Review Labs Reviewed  CBC - Abnormal; Notable for the following:    WBC 17.4 (*)    RBC 3.41 (*)    Hemoglobin 10.5 (*)    HCT 31.4 (*)    Platelets 143 (*)    All other components within normal limits  PRO B NATRIURETIC PEPTIDE - Abnormal; Notable for the following:    Pro B Natriuretic peptide (BNP) 4170.0 (*)    All other components within normal limits  COMPREHENSIVE METABOLIC PANEL - Abnormal; Notable for the following:    Potassium 3.3 (*)    Albumin 2.2 (*)    Alkaline Phosphatase 182 (*)    GFR calc non Af Amer 80 (*)    All other components within normal limits  POCT I-STAT, CHEM 8 - Abnormal; Notable for the following:    Potassium 3.4 (*)    Hemoglobin 10.9 (*)    HCT 32.0 (*)  All other components within normal limits  POCT I-STAT 3, BLOOD GAS (G3P V) - Abnormal; Notable for the following:    pH, Ven 7.383 (*)    pCO2, Ven 41.9 (*)    pO2, Ven 51.0 (*)    Bicarbonate 25.0 (*)    All other components within normal limits  LIPASE, BLOOD  LACTIC ACID, PLASMA  URINALYSIS, DIPSTICK ONLY  BLOOD GAS, VENOUS  CG4 I-STAT (LACTIC ACID)  POCT I-STAT  TROPONIN I   Imaging Review Dg Chest Port 1 View  07/18/2013   CLINICAL DATA:  Vomiting.  Smoker.  EXAM: PORTABLE CHEST - 1 VIEW  COMPARISON:  07/06/2013.  FINDINGS: Normal sized heart. Post CABG changes. Decreased inspiration with minimal bibasilar atelectasis. Atheromatous arterial calcifications. Stable right basilar scarring. Stable prominence of the interstitial markings.  IMPRESSION: 1. Poor inspiration with minimal bibasilar atelectasis. 2. Stable changes of COPD with right basilar scarring.   Electronically Signed   By: Gordan Payment M.D.   On: 07/18/2013 14:28    EKG Interpretation   None      Results for orders placed during the hospital encounter of 07/18/13  CBC      Result Value Range   WBC 17.4 (*) 4.0 - 10.5 K/uL   RBC 3.41 (*) 4.22 - 5.81 MIL/uL   Hemoglobin 10.5 (*) 13.0 - 17.0 g/dL   HCT 81.1 (*) 91.4 - 78.2 %   MCV 92.1  78.0 - 100.0 fL   MCH 30.8  26.0 - 34.0 pg   MCHC 33.4  30.0 - 36.0 g/dL   RDW 95.6  21.3 - 08.6 %   Platelets 143 (*) 150 - 400 K/uL  PRO B NATRIURETIC PEPTIDE      Result Value Range   Pro B Natriuretic peptide (BNP) 4170.0 (*) 0 - 450 pg/mL  COMPREHENSIVE METABOLIC PANEL      Result Value Range   Sodium 136  135 - 145 mEq/L   Potassium 3.3 (*) 3.5 - 5.1 mEq/L   Chloride 99  96 - 112 mEq/L   CO2 24  19 - 32 mEq/L   Glucose, Bld 90  70 - 99 mg/dL   BUN 20  6 - 23 mg/dL   Creatinine, Ser 5.78  0.50 - 1.35 mg/dL   Calcium 8.7  8.4 - 46.9 mg/dL   Total Protein 6.0  6.0 - 8.3 g/dL   Albumin 2.2 (*) 3.5 - 5.2 g/dL   AST 18  0 - 37 U/L   ALT 11  0 - 53 U/L   Alkaline Phosphatase 182 (*) 39 - 117 U/L   Total Bilirubin 0.4  0.3 - 1.2 mg/dL   GFR calc non Af Amer 80 (*) >90 mL/min   GFR calc Af Amer >90  >90 mL/min  POCT I-STAT, CHEM 8      Result Value Range   Sodium 138  135 - 145 mEq/L   Potassium 3.4 (*) 3.5 - 5.1 mEq/L   Chloride 99  96 - 112 mEq/L   BUN 19  6 - 23 mg/dL   Creatinine, Ser 6.29  0.50 - 1.35 mg/dL   Glucose, Bld 98  70  - 99 mg/dL   Calcium, Ion 5.28  4.13 - 1.30 mmol/L   TCO2 24  0 - 100 mmol/L   Hemoglobin 10.9 (*) 13.0 - 17.0 g/dL   HCT 24.4 (*) 01.0 - 27.2 %  CG4 I-STAT (LACTIC ACID)      Result Value Range   Lactic  Acid, Venous 1.36  0.5 - 2.2 mmol/L  POCT I-STAT TROPONIN I      Result Value Range   Troponin i, poc 0.03  0.00 - 0.08 ng/mL   Comment 3           POCT I-STAT 3, BLOOD GAS (G3P V)      Result Value Range   pH, Ven 7.383 (*) 7.250 - 7.300   pCO2, Ven 41.9 (*) 45.0 - 50.0 mmHg   pO2, Ven 51.0 (*) 30.0 - 45.0 mmHg   Bicarbonate 25.0 (*) 20.0 - 24.0 mEq/L   TCO2 26  0 - 100 mmol/L   O2 Saturation 85.0     Sample type VENOUS      Date: 07/18/2013  Rate: 98  Rhythm: atrial flutter  QRS Axis: normal  Intervals: normal  ST/T Wave abnormalities: nonspecific ST changes  Conduction Disutrbances:right bundle branch block and left anterior fascicular block  Narrative Interpretation:   Old EKG Reviewed: none available   MDM   1. Nausea with vomiting   2. Enteritis due to Norovirus   3. Abdominal pain    78 year old white with multiple medical problems presents emergency department chief complaint of nausea and vomiting and ABD pain. Patient has a significant medical history and was recently admitted for similar symptoms twice over the past month. He was diagnosed with Norovirus.  History is obtained from the patient, his granddaughter, and his wife. Based on the recurrence of his symptoms and his comorbidities a thorough evaluation will be performed.  ER course: ABG with pH of 7.38 PCO2 of 41 bicarbonate 25. I-STAT lactic acid is 1.3, troponin negative, CBC significant for leukocytosis of 17, H&H of 10 and 31, platelets 143. CMP with mild hypokalemia but otherwise unremarkable  Plan to order CT abdomen/pelvis to further differentiate patient's abdominal pain and nausea.  Patient turned over to Dr. Hyacinth Meeker at approximately 1600 pending CT and further evaluation.    Darlys Gales,  MD 07/18/13 Mikle Bosworth

## 2013-07-18 NOTE — ED Notes (Signed)
Denies nausea  

## 2013-07-18 NOTE — ED Notes (Signed)
Admitting doctor at the bedside 

## 2013-07-18 NOTE — Telephone Encounter (Signed)
Ok for verbal 

## 2013-07-18 NOTE — Telephone Encounter (Signed)
HHRN informed 

## 2013-07-18 NOTE — H&P (Addendum)
PCP: [name], MD    Chief Complaint:  Nausea vomiting  HPI: MILFERD ANSELL is a 78 y.o. male   has a past medical history of CEREBROVASCULAR ACCIDENT, HX OF (03/02/2007); CHEST PAIN (01/31/2010); CHRONIC OBSTRUCTIVE PULMONARY DISEASE, ACUTE EXACERBATION (02/18/2009); COPD (03/02/2007); CORONARY ARTERY DISEASE (03/02/2007); DEPRESSION (09/23/2007); DISEASE, PERIPHERAL VASCULAR NEC (03/02/2007); DIVERTICULOSIS, COLON (09/23/2007); FATIGUE (01/22/2009); GERD (03/02/2007); GLUCOSE INTOLERANCE (09/23/2007); HEMORRHOIDS (10/09/2007); HIATAL HERNIA (10/09/2007); HYPERCHOLESTEROLEMIA (10/09/2007); HYPERTENSION (09/23/2007); HYPOKALEMIA (10/09/2007); MESENTERIC VASCULAR INSUFFICIENCY (07/06/2008); MITRAL REGURGITATION (11/27/2008); MI (10/09/2007); PERIPHERAL VASCULAR DISEASE (09/23/2007); PUD (10/09/2007); RENAL ARTERY STENOSIS (03/02/2007); TOBACCO ABUSE (04/20/2010); TUBULOVILLOUS ADENOMA, COLON, 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   Presented with patient have had recurrent episodes of nausea vomiting and diarrhea with a number of recent hospitalizations in the beginning he was diagnosed with no virus. His symptoms would improve but then would recur again. Patient was just discharged a week ago  for presumed gastroenteritis and was found to have gastric ulcers by EGD he was discharged to home and at first was doing better but soon developed nausea and vomiting again. At this point he could not even tolerate water. Family brought him back and repeat CT showed now duodenal thickening resulting in luminal narrowing. This is new from prior CT scan and beginning of November and a  recent EGD. He denies any fever or chills. He continues to have some loose stools and reports epigastric discomfort.  Family feels that he slightly dehydrated. Hospitalist was called for admission patient denies any chest pain shortness of breath reports that he has almost stopped smoking. When he was here last time patient was discharged home off oxygen but he did require few days while in hospital.  Review of Systems:    Pertinent positives include: abdominal pain, nausea, vomiting, diarrhea  Constitutional:  No weight loss, night sweats, Fevers, chills, fatigue, weight loss  HEENT:  No headaches, Difficulty swallowing,Tooth/dental problems,Sore throat,  No sneezing, itching, ear ache, nasal congestion, post nasal drip,  Cardio-vascular:  No chest pain, Orthopnea, PND, anasarca, dizziness, palpitations.no Bilateral lower extremity swelling  GI:  No heartburn, indigestion, , change in bowel habits, loss of appetite, melena, blood in stool, hematemesis Resp:  no shortness of breath at rest. No dyspnea on exertion, No excess mucus, no productive cough, No non-productive cough, No coughing up of blood.No change in color of mucus.No wheezing. Skin:  no rash or lesions. No jaundice GU:  no dysuria, change in color of urine, no urgency or frequency. No straining to urinate.  No flank pain.  Musculoskeletal:  No joint pain or no joint swelling. No decreased range of motion. No back pain.  Psych:  No change in mood or affect. No depression or anxiety. No memory loss.  Neuro: no localizing neurological complaints, no tingling, no weakness, no double vision, no gait abnormality, no slurred speech, no confusion  Otherwise ROS are negative except for above, 10 systems were reviewed  Past Medical History: Past Medical History  Diagnosis Date  . CEREBROVASCULAR ACCIDENT, HX OF 03/02/2007  . CHEST PAIN 01/31/2010  . CHRONIC OBSTRUCTIVE PULMONARY DISEASE, ACUTE EXACERBATION 02/18/2009  . COPD  03/02/2007  . CORONARY ARTERY DISEASE 03/02/2007  . DEPRESSION 09/23/2007    anxiety  . DISEASE, PERIPHERAL VASCULAR NEC 03/02/2007  .  DIVERTICULOSIS, COLON 09/23/2007  . FATIGUE 01/22/2009  . GERD 03/02/2007  . GLUCOSE INTOLERANCE 09/23/2007  . HEMORRHOIDS 10/09/2007  . HIATAL HERNIA 10/09/2007  . HYPERCHOLESTEROLEMIA 10/09/2007  . HYPERTENSION 09/23/2007  . HYPOKALEMIA 10/09/2007  . MESENTERIC VASCULAR INSUFFICIENCY 07/06/2008  . MITRAL REGURGITATION 11/27/2008  . MI 10/09/2007  . PERIPHERAL VASCULAR DISEASE 09/23/2007  . PUD 10/09/2007  . RENAL ARTERY STENOSIS 03/02/2007  . TOBACCO ABUSE 04/20/2010  . 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 URINARY RETENTION 01/22/2009  . VITAMIN B12 DEFICIENCY 01/03/2010  . WRIST PAIN, RIGHT 06/06/2010  . Impaired glucose tolerance 01/29/2011  . Hearing loss in left ear 01/30/2011  . Blindness of both eyes 01/30/2011  . Hyperlipidemia   . Anemia, iron deficiency   . Atrial flutter   . HTN (hypertension) 03/21/2011  . AVM (arteriovenous malformation) 09/2010    stomach and duodenum  . PUD (peptic ulcer disease) 09/2010    pyloric ulcer   Past Surgical History  Procedure Laterality Date  . Coronary artery bypass graft  1995    LIMA-LAD, SVG-OM, SVG-D, SVG-PDA  . Renal artery stenting  2004    bilateral  . S/p multiple le vascular bypass      includine fem-pop x 2 LLE  . S/p bilateral cea    . Coronary angioplasty with stent placement  2010    DES x 2 SVG-OM, cutting balloon PTCA SVG-PDA for ISR  . Appendectomy    . S/p lumbar disc surgury      x2  . S/p rle fem bypass feb 2011    . Cardiac catheterization    . Pr vein bypass graft,aorto-fem-pop    . Carotid endarterectomy    . Coronary angioplasty with stent placement  2011    DES to SVG-DIAG, SVG-PDA  . Esophagogastroduodenoscopy    . Colonoscopy    . Esophagogastroduodenoscopy N/A 07/09/2013    Procedure: ESOPHAGOGASTRODUODENOSCOPY (EGD);  Surgeon: Meryl Dare, MD;  Location: [facility] ENDOSCOPY;   Service: Endoscopy;  Laterality: N/A;     Medications: Prior to Admission medications   Medication Sig Start Date End Date Taking? Authorizing Provider  acetaminophen (TYLENOL) 500 MG tablet Take 500 mg by mouth every 6 (six) hours as needed for moderate pain.    Yes Historical Provider, MD  ALPRAZolam (XANAX) 0.25 MG tablet Take 1 tablet (0.25 mg total) by mouth 2 (two) times daily as needed for anxiety. 07/16/13  Yes Corwin Levins, MD  amiodarone (PACERONE) 400 MG tablet Take 200 mg by mouth daily.   Yes Historical Provider, MD  atorvastatin (LIPITOR) 40 MG tablet Take 40 mg by mouth daily.   Yes Historical Provider, MD  carvedilol (COREG) 3.125 MG tablet Take 1 tablet (3.125 mg total) by mouth 2 (two) times daily with a meal. 06/28/13  Yes Marinda Elk, MD  cilostazol (PLETAL) 100 MG tablet Take 100 mg by mouth 2 (two) times daily.   Yes Historical Provider, MD  ferrous sulfate 325 (65 FE) MG tablet Take 325 mg by mouth 2 (two) times daily with a meal.    Yes Historical Provider, MD  furosemide (LASIX) 20 MG tablet Take 1 tablet (20 mg total) by mouth daily. 07/12/13  Yes Belkys A Regalado, MD  nitroGLYCERIN (NITROSTAT) 0.4 MG SL tablet Place 1 tablet (0.4 mg total) under the tongue every 5 (five) minutes as needed for chest pain. 12/17/12  Yes Rhetta Mura, MD  pantoprazole (PROTONIX) 40 MG tablet Take 1 tablet (40 mg total) by mouth  2 (two) times daily before a meal. 07/12/13  Yes Belkys A Regalado, MD  potassium chloride 20 MEQ TBCR Take 20 mEq by mouth daily. 07/12/13  Yes Belkys A Regalado, MD  promethazine (PHENERGAN) 25 MG tablet Take 1 tablet (25 mg total) by mouth every 8 (eight) hours as needed for nausea or vomiting. 07/16/13  Yes Corwin Levins, MD  ranitidine (ZANTAC) 150 MG tablet Take 1 tablet (150 mg total) by mouth at bedtime. 01/03/13  Yes Corwin Levins, MD  sertraline (ZOLOFT) 100 MG tablet Take 1 tablet (100 mg total) by mouth daily. 07/16/13  Yes Corwin Levins, MD   feeding supplement, RESOURCE BREEZE, (RESOURCE BREEZE) LIQD Take 1 Container by mouth 2 (two) times daily between meals. 07/12/13   Belkys A Regalado, MD    Allergies:  No Known Allergies  Social History:  Ambulatory  walker   Lives at   home   reports that he has been smoking Cigarettes.  He has a 60 pack-year smoking history. He has never used smokeless tobacco. He reports that he does not drink alcohol or use illicit drugs.   Family History: family history includes Cancer in his father; Colon cancer (age of onset: 40) in his mother; Heart disease in his father and sister; Prostate cancer in his brother; Stroke in his sister.    Physical Exam: Patient Vitals for the past 24 hrs:  BP Temp Temp src Pulse Resp SpO2 Height Weight  07/18/13 2043 106/45 mmHg - - - 18 95 % - -  07/18/13 1910 - - - - - 96 % - -  07/18/13 1837 119/58 mmHg - - 107 15 96 % - -  07/18/13 1717 137/60 mmHg - - 104 18 97 % - -  07/18/13 1700 137/60 mmHg - - 106 18 96 % - -  07/18/13 1630 143/64 mmHg - - 105 24 94 % - -  07/18/13 1600 142/64 mmHg - - 104 18 96 % - -  07/18/13 1530 141/62 mmHg - - 105 26 98 % - -  07/18/13 1515 - - - - - - 6' (1.829 m) 71.215 kg (157 lb)  07/18/13 1500 145/63 mmHg - - 105 24 97 % - -  07/18/13 1430 142/58 mmHg - - 100 12 96 % - -  07/18/13 1418 - - - - - 95 % - -  07/18/13 1418 - - - - - 89 % - -  07/18/13 1402 125/50 mmHg 97.7 F (36.5 C) Oral 100 22 92 % - -    1. General:  in No Acute distress 2. Psychological: Alert and  Oriented 3. Head/ENT:    Dry Mucous Membranes                          Head Non traumatic, neck supple                          Normal  Dentition 4. SKIN:   decreased Skin turgor,  Skin clean Dry and intact no rash 5. Heart: Regular rate and rhythm no Murmur, Rub or gallop 6. Lungs: No wheezes but distant breath sounds bilaterally poor air movement 7. Abdomen: Soft, mildly tender left low quadrant, Non distended 8. Lower extremities: no clubbing,  cyanosis, or edema 9. Neurologically Grossly intact, moving all 4 extremities equally 10. MSK: Normal range of motion  body mass index is 21.29 kg/(m^2).   Labs  on Admission:   Recent Labs  07/18/13 1424 07/18/13 1447  NA 136 138  K 3.3* 3.4*  CL 99 99  CO2 24  --   GLUCOSE 90 98  BUN 20 19  CREATININE 0.93 1.10  CALCIUM 8.7  --     Recent Labs  07/18/13 1424  AST 18  ALT 11  ALKPHOS 182*  BILITOT 0.4  PROT 6.0  ALBUMIN 2.2*    Recent Labs  07/18/13 1512  LIPASE 11    Recent Labs  07/18/13 1424 07/18/13 1447  WBC 17.4*  --   HGB 10.5* 10.9*  HCT 31.4* 32.0*  MCV 92.1  --   PLT 143*  --    No results found for this basename: CKTOTAL, CKMB, CKMBINDEX, TROPONINI,  in the last 72 hours No results found for this basename: TSH, T4TOTAL, FREET3, T3FREE, THYROIDAB,  in the last 72 hours No results found for this basename: VITAMINB12, FOLATE, FERRITIN, TIBC, IRON, RETICCTPCT,  in the last 72 hours Lab Results  Component Value Date   HGBA1C 6.1 05/14/2012    Estimated Creatinine Clearance: 57.5 ml/min (by C-G formula based on Cr of 1.1). ABG    Component Value Date/Time   PHART 7.260* 06/19/2013 1140   HCO3 25.0* 07/18/2013 1526   TCO2 26 07/18/2013 1526   ACIDBASEDEF 8.0* 06/19/2013 1140   O2SAT 85.0 07/18/2013 1526     Lab Results  Component Value Date   DDIMER 2.04* 06/18/2013     Other results:  I have pearsonaly reviewed this: ECG REPORT  Rate: 98  Rhythm: Right bundle branch block a flatter possible ST&T Change: No ischemic changes  UA no evidence of infection   Cultures:    Component Value Date/Time   SDES STOOL 07/08/2013 1655   SDES STOOL 07/08/2013 1655   SPECREQUEST Normal 07/08/2013 1655   SPECREQUEST NONE 07/08/2013 1655   CULT  Value: NO SALMONELLA, SHIGELLA, CAMPYLOBACTER, YERSINIA, OR E.COLI 0157:H7 ISOLATED Performed at [facility] 07/08/2013 1655   REPTSTATUS 07/12/2013 FINAL 07/08/2013 1655   REPTSTATUS  07/09/2013 FINAL 07/08/2013 1655       Radiological Exams on Admission: Ct Abdomen Pelvis W Contrast  07/18/2013   CLINICAL DATA:  Vomiting. Recently discharged after being admitted for peptic ulcers.  EXAM: CT ABDOMEN AND PELVIS WITH CONTRAST  TECHNIQUE: Multidetector CT imaging of the abdomen and pelvis was performed using the standard protocol following bolus administration of intravenous contrast.  CONTRAST:  OMNIPAQUE IOHEXOL 300 MG/ML  SOLN  COMPARISON:  06/17/2013.  FINDINGS: Multiple sigmoid colon diverticula are again demonstrated without evidence of diverticulitis. Multiple normal caliber fluid-filled loops of small bowel. Diffuse low density wall thickening in the duodenal bulb, measuring 1.2 cm in thickness on image number 26. This is producing moderate luminal narrowing. Tiny gallstones in the gallbladder. The largest measures 4 mm in maximum diameter. No gallbladder wall thickening or pericholecystic fluid.  Inhomogeneous enhancement of the liver and spleen with no discrete liver mass seen. 2.6 x 2.1 cm left adrenal mass with low density components. This was previously shown to measure 5 Hounsfield units without intravenous contrast, compatible with a benign adenoma. Small left renal cysts. Unremarkable right kidney, urinary bladder and prostate gland. No enlarged lymph nodes. No evidence of appendicitis. Dense atheromatous arterial calcifications. Bilateral renal artery stents. The no significant change in mild bibasilar atelectasis/scarring. Mild lumbar lower thoracic spine degenerative changes.  IMPRESSION: 1. Do tonight is involving the duodenal bulb with moderate luminal narrowing. 2. Normal caliber  fluid-filled small bowel loops. This can be seen with gastroenteritis. 3. Cholelithiasis without evidence of cholecystitis. 4. Extensive arterial calcifications with bilateral renal artery stents. 5. Sigmoid diverticulosis. 6. Stable left adrenal adenoma.   Electronically Signed   By: Gordan Payment M.D.   On: 07/18/2013 18:27   Dg Chest Port 1 View  07/18/2013   CLINICAL DATA:  Vomiting.  Smoker.  EXAM: PORTABLE CHEST - 1 VIEW  COMPARISON:  07/06/2013.  FINDINGS: Normal sized heart. Post CABG changes. Decreased inspiration with minimal bibasilar atelectasis. Atheromatous arterial calcifications. Stable right basilar scarring. Stable prominence of the interstitial markings.  IMPRESSION: 1. Poor inspiration with minimal bibasilar atelectasis. 2. Stable changes of COPD with right basilar scarring.   Electronically Signed   By: Gordan Payment M.D.   On: 07/18/2013 14:28    Chart has been reviewed  Assessment/Plan  This is a 78 year old gentleman with recurrent nausea vomiting and diarrhea after suffering nor virus episode. Now presents with duodenal swelling with partial luminal obstruction.  Present on Admission:  . Nausea and vomiting in adult - likely due to partial duodenal obstruction. This could be inflammatory in nature. Would recommend GI consult in a.m. for further recommendations for it now bowel rest.continue Protonix  . Gastric ulcer - continue Protonix and SCDs  . Dehydration - hold Lasix gentle IV fluids  . Hypokalemia - gently replete  . Partial gastric outlet obstruction will need a GI consult in a.m. conservative management for now. Patient is comfortable will hold off on NG tube if this does not resolve rapidly he may need alternative methods of feeding with tube placement. This is most likely inflammatory in etiology since this developed rapidly Peripheral vascular disease - continue Pletal Hypertension continue coreg Coronary artery disease monitor on telemetry given history of A. fib currently asymptomatic Atrial fibrillation continue milligram monitor on telemetry not a candidate for anticoagulation given history of recent peptic ulcer disease and possible need for procedures History and norvirus will put on contact the cautions Prophylaxis: SCD, Protonix  CODE  STATUS: FULL CODE  Other plan as per orders.  I have spent a total of 55 min on this admission  Ida [name] 07/18/2013, 9:25 PM

## 2013-07-18 NOTE — ED Notes (Signed)
Pt finished drinking oral contrast, CT notified.  

## 2013-07-18 NOTE — ED Notes (Signed)
Report received, assumed care.  

## 2013-07-18 NOTE — Telephone Encounter (Signed)
[facility] needs verbal ok for RN consult for nutrition and medication call back number [phone]

## 2013-07-19 DIAGNOSIS — R109 Unspecified abdominal pain: Secondary | ICD-10-CM

## 2013-07-19 DIAGNOSIS — E43 Unspecified severe protein-calorie malnutrition: Secondary | ICD-10-CM | POA: Diagnosis present

## 2013-07-19 DIAGNOSIS — R112 Nausea with vomiting, unspecified: Secondary | ICD-10-CM

## 2013-07-19 LAB — PHOSPHORUS: Phosphorus: 2.7 mg/dL (ref 2.3–4.6)

## 2013-07-19 LAB — CBC
MCH: 30.3 pg (ref 26.0–34.0)
MCHC: 32.9 g/dL (ref 30.0–36.0)
MCV: 92.3 fL (ref 78.0–100.0)
Platelets: 148 10*3/uL — ABNORMAL LOW (ref 150–400)
RDW: 15.3 % (ref 11.5–15.5)

## 2013-07-19 LAB — COMPREHENSIVE METABOLIC PANEL
ALT: 9 U/L (ref 0–53)
AST: 15 U/L (ref 0–37)
Albumin: 2 g/dL — ABNORMAL LOW (ref 3.5–5.2)
Alkaline Phosphatase: 177 U/L — ABNORMAL HIGH (ref 39–117)
CO2: 25 mEq/L (ref 19–32)
Calcium: 8.6 mg/dL (ref 8.4–10.5)
Chloride: 98 mEq/L (ref 96–112)
GFR calc non Af Amer: 82 mL/min — ABNORMAL LOW (ref >90)
Potassium: 3.4 mEq/L — ABNORMAL LOW (ref 3.5–5.1)
Sodium: 135 mEq/L (ref 135–145)
Total Bilirubin: 0.3 mg/dL (ref 0.3–1.2)

## 2013-07-19 LAB — MAGNESIUM: Magnesium: 1.7 mg/dL (ref 1.5–2.5)

## 2013-07-19 LAB — OCCULT BLOOD, POC DEVICE: Fecal Occult Bld: POSITIVE — AB

## 2013-07-19 MED ORDER — ACETAMINOPHEN 325 MG PO TABS
650.0000 mg | ORAL_TABLET | Freq: Four times a day (QID) | ORAL | Status: DC | PRN
  Administered 2013-07-19 (×3): 650 mg via ORAL
  Filled 2013-07-19 (×4): qty 2

## 2013-07-19 MED ORDER — POTASSIUM CHLORIDE 10 MEQ/100ML IV SOLN
10.0000 meq | INTRAVENOUS | Status: AC
  Administered 2013-07-19 (×2): 10 meq via INTRAVENOUS
  Filled 2013-07-19 (×2): qty 100

## 2013-07-19 MED ORDER — LEVALBUTEROL HCL 0.63 MG/3ML IN NEBU
0.6300 mg | INHALATION_SOLUTION | Freq: Four times a day (QID) | RESPIRATORY_TRACT | Status: DC | PRN
  Administered 2013-07-27: 0.63 mg via RESPIRATORY_TRACT
  Filled 2013-07-19 (×2): qty 3

## 2013-07-19 MED ORDER — GUAIFENESIN ER 600 MG PO TB12
600.0000 mg | ORAL_TABLET | Freq: Two times a day (BID) | ORAL | Status: DC
  Administered 2013-07-19 – 2013-07-20 (×4): 600 mg via ORAL
  Filled 2013-07-19 (×7): qty 1

## 2013-07-19 MED ORDER — ONDANSETRON HCL 4 MG/2ML IJ SOLN: 4.0000 mg | Freq: Three times a day (TID) | INTRAMUSCULAR | Status: AC | PRN

## 2013-07-19 MED ORDER — GUAIFENESIN-DM 100-10 MG/5ML PO SYRP: 5.0000 mL | ORAL_SOLUTION | ORAL | Status: DC | PRN

## 2013-07-19 MED ORDER — AMIODARONE HCL 200 MG PO TABS
200.0000 mg | ORAL_TABLET | Freq: Every day | ORAL | Status: DC
  Administered 2013-07-19 – 2013-08-03 (×14): 200 mg via ORAL
  Filled 2013-07-19 (×18): qty 1

## 2013-07-19 MED ORDER — CILOSTAZOL 100 MG PO TABS
100.0000 mg | ORAL_TABLET | Freq: Two times a day (BID) | ORAL | Status: DC
  Administered 2013-07-19 – 2013-07-24 (×11): 100 mg via ORAL
  Filled 2013-07-19 (×16): qty 1

## 2013-07-19 MED ORDER — ONDANSETRON HCL 4 MG/2ML IJ SOLN
4.0000 mg | Freq: Four times a day (QID) | INTRAMUSCULAR | Status: DC | PRN
  Administered 2013-07-19: 4 mg via INTRAVENOUS

## 2013-07-19 MED ORDER — CARVEDILOL 3.125 MG PO TABS
3.1250 mg | ORAL_TABLET | Freq: Two times a day (BID) | ORAL | Status: DC
  Administered 2013-07-19 – 2013-07-21 (×5): 3.125 mg via ORAL
  Filled 2013-07-19 (×7): qty 1

## 2013-07-19 MED ORDER — ONDANSETRON HCL 4 MG PO TABS: 4.0000 mg | ORAL_TABLET | Freq: Four times a day (QID) | ORAL | Status: DC | PRN

## 2013-07-19 MED ORDER — PANTOPRAZOLE SODIUM 40 MG IV SOLR
40.0000 mg | Freq: Two times a day (BID) | INTRAVENOUS | Status: DC
  Administered 2013-07-19 – 2013-07-31 (×25): 40 mg via INTRAVENOUS
  Filled 2013-07-19 (×36): qty 40

## 2013-07-19 MED ORDER — SODIUM CHLORIDE 0.9 % IV SOLN
INTRAVENOUS | Status: DC
  Administered 2013-07-19: 02:00:00 via INTRAVENOUS

## 2013-07-19 MED ORDER — POTASSIUM CHLORIDE IN NACL 40-0.9 MEQ/L-% IV SOLN
INTRAVENOUS | Status: AC
  Administered 2013-07-19 – 2013-07-20 (×2): via INTRAVENOUS
  Filled 2013-07-19 (×2): qty 1000

## 2013-07-19 MED ORDER — SODIUM CHLORIDE 0.9 % IJ SOLN
3.0000 mL | Freq: Two times a day (BID) | INTRAMUSCULAR | Status: DC
  Administered 2013-07-19 – 2013-07-24 (×3): 3 mL via INTRAVENOUS

## 2013-07-19 MED ORDER — MORPHINE SULFATE 2 MG/ML IJ SOLN
1.0000 mg | INTRAMUSCULAR | Status: DC | PRN
  Administered 2013-07-20 (×2): 1 mg via INTRAVENOUS
  Filled 2013-07-19 (×4): qty 1

## 2013-07-19 MED ORDER — NICOTINE 14 MG/24HR TD PT24
14.0000 mg | MEDICATED_PATCH | Freq: Every day | TRANSDERMAL | Status: DC
  Filled 2013-07-19 (×3): qty 1

## 2013-07-19 MED ORDER — HYDROCODONE-ACETAMINOPHEN 5-325 MG PO TABS
1.0000 | ORAL_TABLET | ORAL | Status: DC | PRN
  Administered 2013-07-20: 1 via ORAL
  Filled 2013-07-19: qty 1

## 2013-07-19 MED ORDER — ALPRAZOLAM 0.25 MG PO TABS
0.2500 mg | ORAL_TABLET | Freq: Two times a day (BID) | ORAL | Status: DC | PRN
  Administered 2013-07-20 (×2): 0.25 mg via ORAL
  Filled 2013-07-19 (×2): qty 1

## 2013-07-19 MED ORDER — ACETAMINOPHEN 650 MG RE SUPP: 650.0000 mg | Freq: Four times a day (QID) | RECTAL | Status: DC | PRN

## 2013-07-19 NOTE — Progress Notes (Signed)
[facility]                                                                                Patient Demographics  [name], is a 78 y.o. male, DOB - May 04, 1935, ZOX:[id number]  Admit date - 07/18/2013   Admitting Physician Roberto Doyne, MD  Outpatient Primary MD for the patient is Roberto Barre, MD  LOS - 1   Chief Complaint  Patient presents with  . Emesis        Assessment & Plan    1. Nausea and vomiting in adult with evidence of duodenitis on CT scan causing likely due to partial duodenal obstruction (Endoscopy 11-26: Three medium sized gastric antral ulcers. 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 Duodenitis) . This could be inflammatory in nature. Continue PPI, he is somewhat better, GI has been consulted. We'll continue bowel rest with gentle IV fluids. He does have history of Norovirus in the past. Stool PCR has been reordered.     2. Dehydration with mild hyperkalemia secondary to nausea and vomiting. Supportive care as above, he is feeling better, pain has improved and so as his nausea, IV fluids with potassium to continue. Monitor BMP. . Gastric ulcer - continue Protonix and SCDs      3. Hypertension continue coreg      4. History of atrial fibrillation and Coronary artery disease - able on telemetry, symptom-free, continue beta blocker goal will be rate controlled, not a candidate for anticoagulation due to recent history of upper GI bleed and active gastritis and duodenitis. Antiplatelet medications on hold due to ongoing active GI issues.      5. Recent history and norvirus will put on contact precautions, check GI pathogen panel.       Code Status:  Full  Family Communication: wife  Disposition Plan: Home   Procedures CT Abd Pelvis   Consults  GI   Medications  Scheduled Meds: . sodium chloride   Intravenous STAT  . amiodarone  200 mg Oral Daily  . carvedilol  3.125 mg Oral BID WC  .  cilostazol  100 mg Oral BID  . guaiFENesin  600 mg Oral BID  . nicotine  14 mg Transdermal Daily  . pantoprazole (PROTONIX) IV  40 mg Intravenous Q12H  . sodium chloride  3 mL Intravenous Q12H   Continuous Infusions:  PRN Meds:.acetaminophen, acetaminophen, ALPRAZolam, guaiFENesin-dextromethorphan, HYDROcodone-acetaminophen, levalbuterol, morphine injection, ondansetron (ZOFRAN) IV, ondansetron (ZOFRAN) IV, ondansetron (ZOFRAN) IV, ondansetron  DVT Prophylaxis  SCDs   Lab Results  Component Value Date   PLT 148* 07/19/2013    Antibiotics   Anti-infectives   None        Subjective:   [name] today has, No headache, No chest pain, improved abdominal pain & Nausea, No new weakness tingling or numbness, No Cough - SOB.  Objective:   Filed Vitals:   07/19/13 0128 07/19/13 0500 07/19/13 1010 07/19/13 1011  BP: 125/55 111/50 109/56 95/54  Pulse: 100 98 97 99  Temp: 97.8 F (36.6 C) 98.3 F (36.8 C)    TempSrc: Oral Oral    Resp: 17 18    Height:  Weight: 68.402 kg (150 lb 12.8 oz)     SpO2: 94% 93% 91%     Wt Readings from Last 3 Encounters:  07/19/13 68.402 kg (150 lb 12.8 oz)  07/16/13 71.215 kg (157 lb)  07/12/13 71.578 kg (157 lb 12.8 oz)     Intake/Output Summary (Last 24 hours) at 07/19/13 1018 Last data filed at 07/18/13 1639  Gross per 24 hour  Intake      0 ml  Output    500 ml  Net   -500 ml    Exam Awake Alert, Oriented X 3, No new F.N deficits, Normal affect [name].AT,PERRAL Supple Neck,No JVD, No cervical lymphadenopathy appriciated.  Symmetrical Chest wall movement, Good air movement bilaterally, CTAB RRR,No Gallops,Rubs or new Murmurs, No Parasternal Heave +ve B.Sounds, Abd Soft, mild preiumblical tenderness, No organomegaly appriciated, No rebound - guarding or rigidity. No Cyanosis, Clubbing or edema, No new Rash or bruise      Data Review   Micro Results No results found for this or any previous visit (from the past 240  hour(s)).  Radiology Reports   Ct Abdomen Pelvis W Contrast  07/18/2013   CLINICAL DATA:  Vomiting. Recently discharged after being admitted for peptic ulcers.  EXAM: CT ABDOMEN AND PELVIS WITH CONTRAST  TECHNIQUE: Multidetector CT imaging of the abdomen and pelvis was performed using the standard protocol following bolus administration of intravenous contrast.  CONTRAST:  OMNIPAQUE IOHEXOL 300 MG/ML  SOLN  COMPARISON:  06/17/2013.  FINDINGS: Multiple sigmoid colon diverticula are again demonstrated without evidence of diverticulitis. Multiple normal caliber fluid-filled loops of small bowel. Diffuse low density wall thickening in the duodenal bulb, measuring 1.2 cm in thickness on image number 26. This is producing moderate luminal narrowing. Tiny gallstones in the gallbladder. The largest measures 4 mm in maximum diameter. No gallbladder wall thickening or pericholecystic fluid.  Inhomogeneous enhancement of the liver and spleen with no discrete liver mass seen. 2.6 x 2.1 cm left adrenal mass with low density components. This was previously shown to measure 5 Hounsfield units without intravenous contrast, compatible with a benign adenoma. Small left renal cysts. Unremarkable right kidney, urinary bladder and prostate gland. No enlarged lymph nodes. No evidence of appendicitis. Dense atheromatous arterial calcifications. Bilateral renal artery stents. The no significant change in mild bibasilar atelectasis/scarring. Mild lumbar lower thoracic spine degenerative changes.  IMPRESSION: 1. Do tonight is involving the duodenal bulb with moderate luminal narrowing. 2. Normal caliber fluid-filled small bowel loops. This can be seen with gastroenteritis. 3. Cholelithiasis without evidence of cholecystitis. 4. Extensive arterial calcifications with bilateral renal artery stents. 5. Sigmoid diverticulosis. 6. Stable left adrenal adenoma.   Electronically Signed   By: Gordan Payment M.D.   On: 07/18/2013 18:27       Dg Chest Port 1 View  07/18/2013   CLINICAL DATA:  Vomiting.  Smoker.  EXAM: PORTABLE CHEST - 1 VIEW  COMPARISON:  07/06/2013.  FINDINGS: Normal sized heart. Post CABG changes. Decreased inspiration with minimal bibasilar atelectasis. Atheromatous arterial calcifications. Stable right basilar scarring. Stable prominence of the interstitial markings.  IMPRESSION: 1. Poor inspiration with minimal bibasilar atelectasis. 2. Stable changes of COPD with right basilar scarring.   Electronically Signed   By: Gordan Payment M.D.   On: 07/18/2013 14:28      CBC  Recent Labs Lab 07/12/13 1120 07/18/13 1424 07/18/13 1447 07/19/13 0400  WBC 10.2 17.4*  --  14.5*  HGB 9.7* 10.5* 10.9* 9.4*  HCT 28.3* 31.4*  32.0* 28.6*  PLT 272 143*  --  148*  MCV 91.0 92.1  --  92.3  MCH 31.2 30.8  --  30.3  MCHC 34.3 33.4  --  32.9  RDW 15.4 15.2  --  15.3    Chemistries   Recent Labs Lab 07/18/13 1424 07/18/13 1447 07/19/13 0400  NA 136 138 135  K 3.3* 3.4* 3.4*  CL 99 99 98  CO2 24  --  25  GLUCOSE 90 98 81  BUN 20 19 21   CREATININE 0.93 1.10 0.87  CALCIUM 8.7  --  8.6  MG  --   --  1.7  AST 18  --  15  ALT 11  --  9  ALKPHOS 182*  --  177*  BILITOT 0.4  --  0.3   ------------------------------------------------------------------------------------------------------------------ estimated creatinine clearance is 69.9 ml/min (by C-G formula based on Cr of 0.87). ------------------------------------------------------------------------------------------------------------------ No results found for this basename: HGBA1C,  in the last 72 hours ------------------------------------------------------------------------------------------------------------------ No results found for this basename: CHOL, HDL, LDLCALC, TRIG, CHOLHDL, LDLDIRECT,  in the last 72 hours ------------------------------------------------------------------------------------------------------------------ No results found for this  basename: TSH, T4TOTAL, FREET3, T3FREE, THYROIDAB,  in the last 72 hours ------------------------------------------------------------------------------------------------------------------ No results found for this basename: VITAMINB12, FOLATE, FERRITIN, TIBC, IRON, RETICCTPCT,  in the last 72 hours  Coagulation profile No results found for this basename: INR, PROTIME,  in the last 168 hours  No results found for this basename: DDIMER,  in the last 72 hours  Cardiac Enzymes No results found for this basename: CK, CKMB, TROPONINI, MYOGLOBIN,  in the last 168 hours ------------------------------------------------------------------------------------------------------------------ No components found with this basename: POCBNP,      Time Spent in minutes   35   SINGH,PRASHANT K M.D on 07/19/2013 at 10:18 AM  Between 7am to 7pm - Pager - [phone]  After 7pm go to [email or web address] - password [email or web address]  And look for the night coverage person covering for me after hours  [facility] Office  [phone]

## 2013-07-19 NOTE — Consult Note (Signed)
Consultation  Referring Provider:  [facility]   Primary Care Physician:  [name], MD Primary Gastroenterologist: Stan Head, MD        Reason for Consultation:  Nausea, vomiting, abnormal CTscan          HPI:   [name] is a 78 y.o. male with multiple medical problems including, but not limited to CAD, significant PVD, atrial fibrillation, and chronic systlic heart failure.  He was admitted for several days early last month with Norovirus, respiratory failure, ARF. He was readmitted a couple of weeks later with nausea and vomiting. Patient has known history of gallstones. LFTs were mildly abnormal last admission. HIDA obtained and negative.  Patient then underwent EGD revealing 3 medium sized gastric antral ulcers, one large gastric fundus ulcer, an antral AVM and duodenitis.  Gastric biopsies c/w chronic, active gastritis / reactive gastropathy. No h.pylori. BID PPI recommended. ASA resumed but patient advised to avoid all other NSAIDs. He improved and was discharged 07/12/13. Following discharge patient did well at home for approximately two days. During that time he had solids and had no abdominal pain. Over last few days he has developed recurrent abdominal pain, nausea and vomiting. He is unable to hold down water. Denies constipation. Bowels chronically loose but are at baseline for him. No fevers. Pain is mid abdomen, between umbilicus and epigastrium. CT scan shows duodenal bulb wall thickening resulting in moderate luminal narrowing. He hasn't been taking any NSAIDS and has been on BID PPI since last discharge  Past Medical History  Diagnosis Date  . CEREBROVASCULAR ACCIDENT, HX OF 03/02/2007  . CHRONIC OBSTRUCTIVE PULMONARY DISEASE, ACUTE EXACERBATION 02/18/2009  . COPD 03/02/2007  . CORONARY ARTERY DISEASE 03/02/2007  . DEPRESSION 09/23/2007    anxiety  . DISEASE, PERIPHERAL VASCULAR NEC 03/02/2007  . DIVERTICULOSIS, COLON 09/23/2007  . GERD 03/02/2007  . GLUCOSE  INTOLERANCE 09/23/2007  . HEMORRHOIDS 10/09/2007  . HIATAL HERNIA 10/09/2007  . HYPERCHOLESTEROLEMIA 10/09/2007  . HYPERTENSION 09/23/2007  . HYPOKALEMIA 10/09/2007  . MESENTERIC VASCULAR INSUFFICIENCY 07/06/2008  . MITRAL REGURGITATION 11/27/2008  . MI 10/09/2007  . PERIPHERAL VASCULAR DISEASE 09/23/2007  . RENAL ARTERY STENOSIS 03/02/2007  . TOBACCO ABUSE 04/20/2010  . 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 URINARY RETENTION 01/22/2009  . VITAMIN B12 DEFICIENCY 01/03/2010  . WRIST PAIN, RIGHT 06/06/2010  . Impaired glucose tolerance 01/29/2011  . Hearing loss in left ear 01/30/2011  . Blindness of both eyes 01/30/2011  . Hyperlipidemia   . Anemia, iron deficiency   . Atrial flutter   . HTN (hypertension) 03/21/2011  . AVM (arteriovenous malformation) 09/2010    stomach and duodenum  . PUD (peptic ulcer disease) 09/2010    pyloric ulcer    Past Surgical History  Procedure Laterality Date  . Coronary artery bypass graft  1995    LIMA-LAD, SVG-OM, SVG-D, SVG-PDA  . Renal artery stenting  2004    bilateral  . S/p multiple le vascular bypass      includine fem-pop x 2 LLE  . S/p bilateral cea    . Coronary angioplasty with stent placement  2010    DES x 2 SVG-OM, cutting balloon PTCA SVG-PDA for ISR  . Appendectomy    . S/p lumbar disc surgury      x2  . S/p rle fem bypass feb 2011    . Cardiac catheterization    . Pr vein bypass graft,aorto-fem-pop    . Carotid endarterectomy    .  Coronary angioplasty with stent placement  2011    DES to SVG-DIAG, SVG-PDA  . Esophagogastroduodenoscopy    . Colonoscopy    . Esophagogastroduodenoscopy N/A 07/09/2013    Procedure: ESOPHAGOGASTRODUODENOSCOPY (EGD);  Surgeon: Meryl Dare, MD;  Location: [facility] ENDOSCOPY;  Service: Endoscopy;  Laterality: N/A;    Family History  Problem Relation Age of Onset  . Colon cancer Mother 62  . Heart disease Father   . Cancer Father   . Heart disease Sister   . Prostate cancer Brother     prostate  cancer  . Stroke Sister      History  Substance Use Topics  . Smoking status: Current Every Day Smoker -- 1.00 packs/day for 60 years    Types: Cigarettes  . Smokeless tobacco: Never Used     Comment: 1 ppd  . Alcohol Use: No    Prior to Admission medications   Medication Sig Start Date End Date Taking? Authorizing Provider  acetaminophen (TYLENOL) 500 MG tablet Take 500 mg by mouth every 6 (six) hours as needed for moderate pain.    Yes Historical Provider, MD  ALPRAZolam (XANAX) 0.25 MG tablet Take 1 tablet (0.25 mg total) by mouth 2 (two) times daily as needed for anxiety. 07/16/13  Yes Corwin Levins, MD  amiodarone (PACERONE) 400 MG tablet Take 200 mg by mouth daily.   Yes Historical Provider, MD  atorvastatin (LIPITOR) 40 MG tablet Take 40 mg by mouth daily.   Yes Historical Provider, MD  carvedilol (COREG) 3.125 MG tablet Take 1 tablet (3.125 mg total) by mouth 2 (two) times daily with a meal. 06/28/13  Yes Marinda Elk, MD  cilostazol (PLETAL) 100 MG tablet Take 100 mg by mouth 2 (two) times daily.   Yes Historical Provider, MD  ferrous sulfate 325 (65 FE) MG tablet Take 325 mg by mouth 2 (two) times daily with a meal.    Yes Historical Provider, MD  furosemide (LASIX) 20 MG tablet Take 1 tablet (20 mg total) by mouth daily. 07/12/13  Yes Belkys A Regalado, MD  nitroGLYCERIN (NITROSTAT) 0.4 MG SL tablet Place 1 tablet (0.4 mg total) under the tongue every 5 (five) minutes as needed for chest pain. 12/17/12  Yes Rhetta Mura, MD  pantoprazole (PROTONIX) 40 MG tablet Take 1 tablet (40 mg total) by mouth 2 (two) times daily before a meal. 07/12/13  Yes Belkys A Regalado, MD  potassium chloride 20 MEQ TBCR Take 20 mEq by mouth daily. 07/12/13  Yes Belkys A Regalado, MD  promethazine (PHENERGAN) 25 MG tablet Take 1 tablet (25 mg total) by mouth every 8 (eight) hours as needed for nausea or vomiting. 07/16/13  Yes Corwin Levins, MD  ranitidine (ZANTAC) 150 MG tablet Take 1 tablet  (150 mg total) by mouth at bedtime. 01/03/13  Yes Corwin Levins, MD  sertraline (ZOLOFT) 100 MG tablet Take 1 tablet (100 mg total) by mouth daily. 07/16/13  Yes Corwin Levins, MD  feeding supplement, RESOURCE BREEZE, (RESOURCE BREEZE) LIQD Take 1 Container by mouth 2 (two) times daily between meals. 07/12/13   Alba Cory, MD    Current Facility-Administered Medications  Medication Dose Route Frequency Provider Last Rate Last Dose  . 0.9 %  sodium chloride infusion   Intravenous STAT Therisa Doyne, MD 75 mL/hr at 07/19/13 0213    . acetaminophen (TYLENOL) tablet 650 mg  650 mg Oral Q6H PRN Therisa Doyne, MD   650 mg at 07/19/13 [phone]  Or  . acetaminophen (TYLENOL) suppository 650 mg  650 mg Rectal Q6H PRN Therisa Doyne, MD      . ALPRAZolam Prudy Feeler) tablet 0.25 mg  0.25 mg Oral BID PRN Therisa Doyne, MD      . amiodarone (PACERONE) tablet 200 mg  200 mg Oral Daily Therisa Doyne, MD      . carvedilol (COREG) tablet 3.125 mg  3.125 mg Oral BID WC Therisa Doyne, MD   3.125 mg at 07/19/13 0851  . cilostazol (PLETAL) tablet 100 mg  100 mg Oral BID Therisa Doyne, MD   100 mg at 07/19/13 0226  . guaiFENesin (MUCINEX) 12 hr tablet 600 mg  600 mg Oral BID Therisa Doyne, MD   600 mg at 07/19/13 0226  . guaiFENesin-dextromethorphan (ROBITUSSIN DM) 100-10 MG/5ML syrup 5 mL  5 mL Oral Q4H PRN Therisa Doyne, MD      . HYDROcodone-acetaminophen (NORCO/VICODIN) 5-325 MG per tablet 1-2 tablet  1-2 tablet Oral Q4H PRN Therisa Doyne, MD      . levalbuterol (XOPENEX) nebulizer solution 0.63 mg  0.63 mg Nebulization Q6H PRN Therisa Doyne, MD      . morphine 2 MG/ML injection 1 mg  1 mg Intravenous Q4H PRN Therisa Doyne, MD      . nicotine (NICODERM CQ - dosed in mg/24 hours) patch 14 mg  14 mg Transdermal Daily Therisa Doyne, MD      . ondansetron (ZOFRAN) injection 4 mg  4 mg Intravenous Q6H PRN Vida Roller, MD      . ondansetron [facility])  injection 4 mg  4 mg Intravenous Q8H PRN Vida Roller, MD      . ondansetron [facility]) tablet 4 mg  4 mg Oral Q6H PRN Therisa Doyne, MD       Or  . ondansetron (ZOFRAN) injection 4 mg  4 mg Intravenous Q6H PRN Therisa Doyne, MD      . pantoprazole (PROTONIX) injection 40 mg  40 mg Intravenous Q12H Therisa Doyne, MD   40 mg at 07/19/13 0226  . sodium chloride 0.9 % injection 3 mL  3 mL Intravenous Q12H Therisa Doyne, MD   3 mL at 07/19/13 0214    Allergies as of 07/18/2013  . (No Known Allergies)    Review of Systems:    All systems reviewed and negative except where noted in HPI.   Physical Exam:  Vital signs in last 24 hours: Temp:  [97.7 F (36.5 C)-98.3 F (36.8 C)] 98.3 F (36.8 C) (12/06 0500) Pulse Rate:  [95-108] 98 (12/06 0500) Resp:  [12-26] 18 (12/06 0500) BP: (106-145)/(45-64) 111/50 mmHg (12/06 0500) SpO2:  [89 %-98 %] 93 % (12/06 0500) Weight:  [150 lb 12.8 oz (68.402 kg)-157 lb (71.215 kg)] 150 lb 12.8 oz (68.402 kg) (12/06 0128) Last BM Date: 07/18/13 General:   Pleasant, elderly white male in NAD Head:  Normocephalic and atraumatic. Eyes:   No icterus.   Conjunctiva pink. Ears:  Normal auditory acuity. Neck:  Supple; no masses felt Lungs:  Respirations even and unlabored. Coarse breath sounds throughout upper and lower lung fields.  No wheezes or crackles  Heart:  Regular rate and rhythm. Abdomen:  Soft, nondistended, mild to moderate mid upper tenderness.  No appreciable masses or hepatomegaly.  Rectal:  Not performed.  Msk:  Symmetrical without gross deformities.  Extremities:  Without edema. Neurologic:  Alert and  oriented x4;  grossly normal neurologically. Skin:  Intact without significant lesions or rashes. Cervical Nodes:  No significant  cervical adenopathy. Psych:  Alert and cooperative. Normal affect.  LAB RESULTS:  Recent Labs  07/18/13 1424 07/18/13 1447 07/19/13 0400  WBC 17.4*  --  14.5*  HGB 10.5* 10.9* 9.4*  HCT  31.4* 32.0* 28.6*  PLT 143*  --  148*   BMET  Recent Labs  07/18/13 1424 07/18/13 1447 07/19/13 0400  NA 136 138 135  K 3.3* 3.4* 3.4*  CL 99 99 98  CO2 24  --  25  GLUCOSE 90 98 81  BUN 20 19 21   CREATININE 0.93 1.10 0.87  CALCIUM 8.7  --  8.6   LFT  Recent Labs  07/19/13 0400  PROT 5.4*  ALBUMIN 2.0*  AST 15  ALT 9  ALKPHOS 177*  BILITOT 0.3    STUDIES: Ct Abdomen Pelvis W Contrast  07/18/2013   CLINICAL DATA:  Vomiting. Recently discharged after being admitted for peptic ulcers.  EXAM: CT ABDOMEN AND PELVIS WITH CONTRAST  TECHNIQUE: Multidetector CT imaging of the abdomen and pelvis was performed using the standard protocol following bolus administration of intravenous contrast.  CONTRAST:  OMNIPAQUE IOHEXOL 300 MG/ML  SOLN  COMPARISON:  06/17/2013.  FINDINGS: Multiple sigmoid colon diverticula are again demonstrated without evidence of diverticulitis. Multiple normal caliber fluid-filled loops of small bowel. Diffuse low density wall thickening in the duodenal bulb, measuring 1.2 cm in thickness on image number 26. This is producing moderate luminal narrowing. Tiny gallstones in the gallbladder. The largest measures 4 mm in maximum diameter. No gallbladder wall thickening or pericholecystic fluid.  Inhomogeneous enhancement of the liver and spleen with no discrete liver mass seen. 2.6 x 2.1 cm left adrenal mass with low density components. This was previously shown to measure 5 Hounsfield units without intravenous contrast, compatible with a benign adenoma. Small left renal cysts. Unremarkable right kidney, urinary bladder and prostate gland. No enlarged lymph nodes. No evidence of appendicitis. Dense atheromatous arterial calcifications. Bilateral renal artery stents. The no significant change in mild bibasilar atelectasis/scarring. Mild lumbar lower thoracic spine degenerative changes.  IMPRESSION: 1. Do tonight is involving the duodenal bulb with moderate luminal  narrowing. 2. Normal caliber fluid-filled small bowel loops. This can be seen with gastroenteritis. 3. Cholelithiasis without evidence of cholecystitis. 4. Extensive arterial calcifications with bilateral renal artery stents. 5. Sigmoid diverticulosis. 6. Stable left adrenal adenoma.   Electronically Signed   By: Gordan Payment M.D.   On: 07/18/2013 18:27   Dg Chest Port 1 View  07/18/2013   CLINICAL DATA:  Vomiting.  Smoker.  EXAM: PORTABLE CHEST - 1 VIEW  COMPARISON:  07/06/2013.  FINDINGS: Normal sized heart. Post CABG changes. Decreased inspiration with minimal bibasilar atelectasis. Atheromatous arterial calcifications. Stable right basilar scarring. Stable prominence of the interstitial markings.  IMPRESSION: 1. Poor inspiration with minimal bibasilar atelectasis. 2. Stable changes of COPD with right basilar scarring.   Electronically Signed   By: Gordan Payment M.D.   On: 07/18/2013 14:28    PREVIOUS ENDOSCOPIES:      EGD 07/11/13  Dr. Russella Dar ENDOSCOPIC IMPRESSION:  1. Three medium sized gastric antral ulcers  2. Single, large, gastric fundus ulcer  3. 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 Duodenitis  RECOMMENDATIONS:  1. Avoid ASA/NSAIDS for 2 months  2. Await pathology results  3. PPI bid  4. Endoscopy in 2-3 months with Dr. Leone Payor to document ulcer  healing          09/2010 EGD for gI bleeding Dr Caro Hight ablation  of gastric and D2 AVMs. Small pyloric ulcer. Atrophic gastric mucosa  02/2010 EGD For iron def anemia and B 12 deficiency Dr Leone Payor  ENDOSCOPIC IMPRESSION:  1) Moderate gastritis in the body and the antrum of the stomach  - biopsied  2) Otherwise normal examination  RECOMMENDATIONS:  1) Await biopsy results  colonoscopy is next  Colonoscopy 02/2010  ENDOSCOPIC IMPRESSION:  1) Two small polyps removed  2) Moderate diverticulosis in the sigmoid colon  3) Internal hemorrhoids  4) Otherwise normal examination, good prep  5) Personal history of 4 adenomas (2  tubulovillous and 1 large  with high-grade dysplasia) removal in 2008  RECOMMENDATIONS:  resume Plavix and continue other medications  Keep follow-up with Dr. Jonny Ruiz re: anemia  Pathology 02/2010  1. STOMACH, BIOPSY, : - CHRONIC GASTRITIS WITH MILD INCREASE IN EOSINOPHILS. - THERE IS NO EVIDENCE OF HELICOBACTER PYLORI, INTESTINAL METAPLASIA, DYSPLASIA, OR MALIGNANCY. - SEE COMMENT. 2. COLON, POLYP(S), TRANSVERSE, RECTUM : - TUBULAR ADENOMA. - HIGH GRADE DYSPLASIA IS NOT IDENTIFIED.  EGD and colonoscopy in 2008    Impression / Plan:   54. 78 year old male with recurrent nausea / vomiting / abdominal pain, this time in setting of duodenal bulb thickening with moderate luminal stenosis. Patient had duodenitis on EGD late last month. CT findings may represent progression of duodenal inflammation though not clear why as patient has been on BID PPI since discharge. He will need repeat EGD for further evaluation. Keep NPO for now.   2. PUD. Continue BID IV PPI.  2.  Asymptomatic cholelithiasis.   3.  Significant vascular disease / history of vascular stents, on Pletal.  4.  Probable malnutrition, albumin low at 2.0.   5. Chronic normocytic anemia. Hgb at baseline  5.  Multiple medical problems as listed above. He has chronic heart failure. BNAT 4170.   Thanks   LOS: 1 day   Willette Cluster  07/19/2013, 9:41 AM Attending MD note:   I have taken a history, examined the patient, and reviewed the chart. I agree with the Advanced Practitioner's impression and recommendations. Will proceed with Enteroscopy to access for possible descending duodenum obstruction. Intestinal angina is a possibility since he has extensive mesenteric calcifications. If all negative, may need to evaluate for central nausea/ depression. Possible GES and CT angiogram to look for critical lesion. Gall stone probably asymptomatic but biliary pancreatitis is a posibility  [facility] Pager #  [phone]

## 2013-07-19 NOTE — Progress Notes (Signed)
INITIAL NUTRITION ASSESSMENT  DOCUMENTATION CODES Per approved criteria  -Severe malnutrition in the context of chronic illness   INTERVENTION: 1.  Modify diet; resume PO diet once medically appropriate per MD discretion. 2.  Enteral nutrition; pt meets criteria for severe malnutrition and has been unable to maintain PO intake at home consistently for several weeks.  Consider early nutrition support if pt unable to advance diet with tolerance within 3 days.   NUTRITION DIAGNOSIS: Inadequate oral intake related to poor appetite, N/V as evidenced by patient report  Goal: Pt to meet >/= 90% of their estimated nutrition needs   Monitor:  PO & supplemental intake, weight, labs, I/O's  Reason for Assessment: Malnutrition Screening Tool Report  78 y.o. male  Admitting Dx: nausea & poor PO intake  ASSESSMENT: Patient with PMH of COPD, CVA and CAD; previous admitted with enteritis (thought to be Norovirus); presented again with ongoing nausea, occasional vomiting and one loose BM per day; + severe fatigue and abdominal pain.  Patient known to clinical nutrition services due recent admissions with decreased PO intake.  Pt has rallied with complete resolve of GI symptoms only for them to return 1-2 days after d/c.  Pt with poor PO since Monday of this week.   Per MD note, pt with possible obstruction r/t duodenitis.    Pt recently found to meet criteria for severe malnutrition of chronic illness.  Pt continues to meet criteria with ongoing wt loss from 170 to 150 lbs and ongoing difficulty in establishing adequate intake sufficient to meet 75% of estimated need.   Nutrition Focused Physical Exam: Subcutaneous Fat:  Orbital Region: moderate wasting Upper Arm Region: moderate wasting Thoracic and Lumbar Region: moderate wasting  Muscle:  Temple Region: moderate wasting Clavicle Bone Region: severe wasting Clavicle and Acromion Bone Region: moderate wasting Scapular Bone Region:  moderate wasting Dorsal Hand: moderate wasting Patellar Region: not assessed Anterior Thigh Region: not assessed Posterior Calf Region: not assessed  Edema: none present  Height: Ht Readings from Last 1 Encounters:  07/18/13 6' (1.829 m)    Weight: Wt Readings from Last 1 Encounters:  07/19/13 150 lb 12.8 oz (68.402 kg)    Ideal Body Weight: 80.9 kg  % Ideal Body Weight: 90%  Wt Readings from Last 10 Encounters:  07/19/13 150 lb 12.8 oz (68.402 kg)  07/16/13 157 lb (71.215 kg)  07/12/13 157 lb 12.8 oz (71.578 kg)  07/12/13 157 lb 12.8 oz (71.578 kg)  07/02/13 151 lb (68.493 kg)  06/28/13 159 lb 9.8 oz (72.4 kg)  01/08/13 173 lb 12.8 oz (78.835 kg)  01/03/13 170 lb 8 oz (77.338 kg)  12/24/12 170 lb 8 oz (77.338 kg)  12/20/12 170 lb 8 oz (77.338 kg)    Usual Body Weight: 77 kg  % Usual Body Weight: 88%  BMI:  Body mass index is 20.45 kg/(m^2).  Estimated Nutritional Needs: Kcal: 1800-2000 Protein: 90-100g Fluid: 1.8-2.0 L  Skin: intact Recent Stage 2 pressure ulcer to sacrum  Diet Order: NPO  EDUCATION NEEDS: -No education needs identified at this time   Intake/Output Summary (Last 24 hours) at 07/19/13 0733 Last data filed at 07/18/13 1639  Gross per 24 hour  Intake      0 ml  Output    500 ml  Net   -500 ml    Labs:   Recent Labs Lab 07/18/13 1424 07/18/13 1447 07/19/13 0400  NA 136 138 135  K 3.3* 3.4* 3.4*  CL 99 99 98  CO2 24  --  25  BUN 20 19 21   CREATININE 0.93 1.10 0.87  CALCIUM 8.7  --  8.6  MG  --   --  1.7  PHOS  --   --  2.7  GLUCOSE 90 98 81    Scheduled Meds: . sodium chloride   Intravenous STAT  . amiodarone  200 mg Oral Daily  . carvedilol  3.125 mg Oral BID WC  . cilostazol  100 mg Oral BID  . guaiFENesin  600 mg Oral BID  . nicotine  14 mg Transdermal Daily  . pantoprazole (PROTONIX) IV  40 mg Intravenous Q12H  . sodium chloride  3 mL Intravenous Q12H    Continuous Infusions:   Past Medical History   Diagnosis Date  . CEREBROVASCULAR ACCIDENT, HX OF 03/02/2007  . CHEST PAIN 01/31/2010  . CHRONIC OBSTRUCTIVE PULMONARY DISEASE, ACUTE EXACERBATION 02/18/2009  . COPD 03/02/2007  . CORONARY ARTERY DISEASE 03/02/2007  . DEPRESSION 09/23/2007    anxiety  . DISEASE, PERIPHERAL VASCULAR NEC 03/02/2007  . DIVERTICULOSIS, COLON 09/23/2007  . FATIGUE 01/22/2009  . GERD 03/02/2007  . GLUCOSE INTOLERANCE 09/23/2007  . HEMORRHOIDS 10/09/2007  . HIATAL HERNIA 10/09/2007  . HYPERCHOLESTEROLEMIA 10/09/2007  . HYPERTENSION 09/23/2007  . HYPOKALEMIA 10/09/2007  . MESENTERIC VASCULAR INSUFFICIENCY 07/06/2008  . MITRAL REGURGITATION 11/27/2008  . MI 10/09/2007  . PERIPHERAL VASCULAR DISEASE 09/23/2007  . PUD 10/09/2007  . RENAL ARTERY STENOSIS 03/02/2007  . TOBACCO ABUSE 04/20/2010  . 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 URINARY RETENTION 01/22/2009  . VITAMIN B12 DEFICIENCY 01/03/2010  . WRIST PAIN, RIGHT 06/06/2010  . Impaired glucose tolerance 01/29/2011  . Hearing loss in left ear 01/30/2011  . Blindness of both eyes 01/30/2011  . Hyperlipidemia   . Anemia, iron deficiency   . Atrial flutter   . HTN (hypertension) 03/21/2011  . AVM (arteriovenous malformation) 09/2010    stomach and duodenum  . PUD (peptic ulcer disease) 09/2010    pyloric ulcer    Past Surgical History  Procedure Laterality Date  . Coronary artery bypass graft  1995    LIMA-LAD, SVG-OM, SVG-D, SVG-PDA  . Renal artery stenting  2004    bilateral  . S/p multiple le vascular bypass      includine fem-pop x 2 LLE  . S/p bilateral cea    . Coronary angioplasty with stent placement  2010    DES x 2 SVG-OM, cutting balloon PTCA SVG-PDA for ISR  . Appendectomy    . S/p lumbar disc surgury      x2  . S/p rle fem bypass feb 2011    . Cardiac catheterization    . Pr vein bypass graft,aorto-fem-pop    . Carotid endarterectomy    . Coronary angioplasty with stent placement  2011    DES to SVG-DIAG, SVG-PDA  . Esophagogastroduodenoscopy     . Colonoscopy    . Esophagogastroduodenoscopy N/A 07/09/2013    Procedure: ESOPHAGOGASTRODUODENOSCOPY (EGD);  Surgeon: Meryl Dare, MD;  Location: [facility] ENDOSCOPY;  Service: Endoscopy;  Laterality: N/A;    Loyce Dys, MS RD LDN Clinical Inpatient Dietitian Pager: [phone] Weekend/After hours pager: [phone]

## 2013-07-20 ENCOUNTER — Encounter (HOSPITAL_COMMUNITY): Payer: Self-pay

## 2013-07-20 ENCOUNTER — Encounter (HOSPITAL_COMMUNITY)
Admission: EM | Disposition: A | Discharge status: Other Acute Inpatient Hospital | Payer: Self-pay | Source: Home / Self Care | Attending: Internal Medicine

## 2013-07-20 DIAGNOSIS — K2960 Other gastritis without bleeding: Secondary | ICD-10-CM | POA: Diagnosis present

## 2013-07-20 DIAGNOSIS — K3184 Gastroparesis: Secondary | ICD-10-CM | POA: Diagnosis present

## 2013-07-20 DIAGNOSIS — K259 Gastric ulcer, unspecified as acute or chronic, without hemorrhage or perforation: Secondary | ICD-10-CM

## 2013-07-20 DIAGNOSIS — R1013 Epigastric pain: Secondary | ICD-10-CM

## 2013-07-20 HISTORY — PX: ESOPHAGOGASTRODUODENOSCOPY: SHX5428

## 2013-07-20 LAB — COMPREHENSIVE METABOLIC PANEL
ALT: 11 U/L (ref 0–53)
Alkaline Phosphatase: 152 U/L — ABNORMAL HIGH (ref 39–117)
GFR calc Af Amer: >90 mL/min (ref >90)
GFR calc non Af Amer: 82 mL/min — ABNORMAL LOW (ref >90)
Glucose, Bld: 60 mg/dL — ABNORMAL LOW (ref 70–99)
Potassium: 3.8 mEq/L (ref 3.5–5.1)
Sodium: 134 mEq/L — ABNORMAL LOW (ref 135–145)
Total Protein: 5.4 g/dL — ABNORMAL LOW (ref 6.0–8.3)

## 2013-07-20 LAB — CBC
HCT: 26.8 % — ABNORMAL LOW (ref 39.0–52.0)
MCH: 30.6 pg (ref 26.0–34.0)
MCHC: 32.8 g/dL (ref 30.0–36.0)
MCV: 93.1 fL (ref 78.0–100.0)
Platelets: 150 10*3/uL (ref 150–400)
RBC: 2.88 MIL/uL — ABNORMAL LOW (ref 4.22–5.81)
RDW: 15.3 % (ref 11.5–15.5)

## 2013-07-20 SURGERY — EGD (ESOPHAGOGASTRODUODENOSCOPY)
Anesthesia: Moderate Sedation

## 2013-07-20 MED ORDER — MIDAZOLAM HCL 5 MG/ML IJ SOLN
INTRAMUSCULAR | Status: AC
  Filled 2013-07-20: qty 2

## 2013-07-20 MED ORDER — FENTANYL CITRATE 0.05 MG/ML IJ SOLN
INTRAMUSCULAR | Status: AC
  Filled 2013-07-20: qty 2

## 2013-07-20 MED ORDER — BUTAMBEN-TETRACAINE-BENZOCAINE 2-2-14 % EX AERO
INHALATION_SPRAY | CUTANEOUS | Status: DC | PRN
  Administered 2013-07-20: 2 via TOPICAL

## 2013-07-20 MED ORDER — POTASSIUM CHLORIDE IN NACL 40-0.9 MEQ/L-% IV SOLN
INTRAVENOUS | Status: DC
  Administered 2013-07-20: 20:00:00 via INTRAVENOUS
  Filled 2013-07-20 (×2): qty 1000

## 2013-07-20 MED ORDER — FENTANYL CITRATE 0.05 MG/ML IJ SOLN
INTRAMUSCULAR | Status: DC | PRN
  Administered 2013-07-20: 25 ug via INTRAVENOUS

## 2013-07-20 MED ORDER — MIDAZOLAM HCL 10 MG/2ML IJ SOLN
INTRAMUSCULAR | Status: DC | PRN
  Administered 2013-07-20 (×2): 2 mg via INTRAVENOUS

## 2013-07-20 NOTE — Progress Notes (Signed)
Pt refuses bath and linen change today due to pain even though wife insists pt has both. Pt states he is not going to do either tonight and will bathe in the am. Pt given pain medicine, see MAR. Pt resting in bed with wife at bedside. Will continue to monitor.

## 2013-07-20 NOTE — Op Note (Signed)
[name] [address] [place], [address]   ENDOSCOPY PROCEDURE REPORT  PATIENT: [name], [name]  MR#: [id number] BIRTHDATE: Aug 22, 1934 , 78  yrs. old GENDER: Male ENDOSCOPIST: Hart Carwin, MD REFERRED BY:  Dr Demetrius Charity.Singh PROCEDURE DATE:  07/20/2013 PROCEDURE:  enteroscopy with biopsy ASA CLASS:     Class III INDICATIONS:  Epigastric pain.   Nausea.   Vomiting.   weight loss nausea and vomiting.  A recent upper endoscopy confirmed gastric ulcers.  CT scan shows possible obstruction of the descending duodenum and mesenteric calcifications in. MEDICATIONS: Versed 4 mg IV and Fentanyl 25 mcg IV TOPICAL ANESTHETIC: Cetacaine Spray  DESCRIPTION OF PROCEDURE: After the risks benefits and alternatives of the procedure were thoroughly explained, informed consent was obtained.  The EC-[id number] ([id number]) endoscope was introduced through the mouth and advanced to the second portion of the duodenum. Without limitations.  The instrument was slowly withdrawn as the mucosa was fully examined.      Esophagus: esophageal mucosa appeared normal through the proximal distal and midesophagus. There was no stricture. Stomach: There wasa large amount of retained material in the stomach which was thick and bilious and malodorous. 300 cc was aspirated. There were  multiple superficial erosions and ulcerations throughout the body and the gastric antrum, as well as  edema and friability of the mucosa;  there was no active bleeding. Multiple biopsies were obtained. Stomach was horizontal in position and it was difficult to reach the pylorus which appeared edematous but I was able to traverse into the duodenum with only mild resistance Duodenum:  duodenal bulb and descending duodenum as well as jejunal low-level of 120 cm showed superficial ulcerations throughout but no retained fluid or bleeding. Mucosa was edematous with patchy erythema and exudate. Multiple biopsies were  obtained[         The scope was then withdrawn from the patient and the procedure completed. endoscope was then brought back into the stomach retroflexed and confirmed presence of gastritis in fundus and cardia  COMPLICATIONS: There were no complications. ENDOSCOPIC IMPRESSION: 1 gastric retention of 300 cc of bilious material 2 severe erosive and ulcerative gastritis status post biopsies rule out ischemic 3 duodenitis and jejunitis. Rule out ischemic. Status post biopsies 4 no evidence for anatomic obstruction as suggested on CT scan  RECOMMENDATIONS: Await pathology results continue liquid diet CT angiogram to look for vascular obstruction Nasal oxygen 2 L. Patient's oxygen saturations were 87% before sedation on room air Assess nutritional status, possible TNA  REPEAT EXAM: no  eSigned:  Hart Carwin, MD 07/20/2013 10:07 AM   CC:  PATIENT NAME:  [name], [name] MR#: [id number]

## 2013-07-21 ENCOUNTER — Inpatient Hospital Stay (HOSPITAL_COMMUNITY): Payer: Medicare Other

## 2013-07-21 ENCOUNTER — Encounter (HOSPITAL_COMMUNITY): Payer: Self-pay | Admitting: Radiology

## 2013-07-21 ENCOUNTER — Inpatient Hospital Stay (HOSPITAL_COMMUNITY): Payer: Medicare Other | Admitting: Anesthesiology

## 2013-07-21 ENCOUNTER — Encounter (HOSPITAL_COMMUNITY): Payer: Medicare Other | Admitting: Anesthesiology

## 2013-07-21 ENCOUNTER — Encounter (HOSPITAL_COMMUNITY)
Admission: EM | Disposition: A | Discharge status: Other Acute Inpatient Hospital | Payer: Self-pay | Source: Home / Self Care | Attending: Internal Medicine

## 2013-07-21 DIAGNOSIS — N179 Acute kidney failure, unspecified: Secondary | ICD-10-CM

## 2013-07-21 DIAGNOSIS — J9600 Acute respiratory failure, unspecified whether with hypoxia or hypercapnia: Secondary | ICD-10-CM

## 2013-07-21 DIAGNOSIS — K551 Chronic vascular disorders of intestine: Secondary | ICD-10-CM

## 2013-07-21 DIAGNOSIS — R109 Unspecified abdominal pain: Secondary | ICD-10-CM

## 2013-07-21 DIAGNOSIS — R571 Hypovolemic shock: Secondary | ICD-10-CM | POA: Diagnosis present

## 2013-07-21 DIAGNOSIS — R933 Abnormal findings on diagnostic imaging of other parts of digestive tract: Secondary | ICD-10-CM

## 2013-07-21 DIAGNOSIS — K55059 Acute (reversible) ischemia of intestine, part and extent unspecified: Secondary | ICD-10-CM

## 2013-07-21 HISTORY — PX: LAPAROTOMY: SHX154

## 2013-07-21 LAB — POCT I-STAT 3, ART BLOOD GAS (G3+)
Acid-base deficit: 13 mmol/L — ABNORMAL HIGH (ref 0.0–2.0)
Bicarbonate: 11.9 mEq/L — ABNORMAL LOW (ref 20.0–24.0)
Bicarbonate: 16.2 mEq/L — ABNORMAL LOW (ref 20.0–24.0)
O2 Saturation: 97 %
TCO2: 17 mmol/L (ref 0–100)
pCO2 arterial: 24.9 mmHg — ABNORMAL LOW (ref 35.0–45.0)
pCO2 arterial: 25.4 mmHg — ABNORMAL LOW (ref 35.0–45.0)
pH, Arterial: 7.289 — ABNORMAL LOW (ref 7.350–7.450)
pO2, Arterial: 104 mmHg — ABNORMAL HIGH (ref 80.0–100.0)
pO2, Arterial: 82 mmHg (ref 80.0–100.0)

## 2013-07-21 LAB — COMPREHENSIVE METABOLIC PANEL
ALT: 338 U/L — ABNORMAL HIGH (ref 0–53)
ALT: 1089 U/L — ABNORMAL HIGH (ref 0–53)
AST: 2871 U/L — ABNORMAL HIGH (ref 0–37)
Albumin: 2.1 g/dL — ABNORMAL LOW (ref 3.5–5.2)
Albumin: 1.6 g/dL — ABNORMAL LOW (ref 3.5–5.2)
Alkaline Phosphatase: 179 U/L — ABNORMAL HIGH (ref 39–117)
BUN: 24 mg/dL — ABNORMAL HIGH (ref 6–23)
CO2: 16 mEq/L — ABNORMAL LOW (ref 19–32)
Calcium: 8.6 mg/dL (ref 8.4–10.5)
Chloride: 102 mEq/L (ref 96–112)
Creatinine, Ser: 1.05 mg/dL (ref 0.50–1.35)
Creatinine, Ser: 1.08 mg/dL (ref 0.50–1.35)
GFR calc non Af Amer: 65 mL/min — ABNORMAL LOW (ref >90)
Potassium: 5.8 mEq/L — ABNORMAL HIGH (ref 3.5–5.1)
Potassium: 5.4 mEq/L — ABNORMAL HIGH (ref 3.5–5.1)
Sodium: 134 mEq/L — ABNORMAL LOW (ref 135–145)
Sodium: 138 mEq/L (ref 135–145)
Total Protein: 6 g/dL (ref 6.0–8.3)

## 2013-07-21 LAB — LIPASE, BLOOD: Lipase: 11 U/L (ref 11–59)

## 2013-07-21 LAB — CBC
HCT: 32.7 % — ABNORMAL LOW (ref 39.0–52.0)
MCH: 30 pg (ref 26.0–34.0)
MCHC: 33 g/dL (ref 30.0–36.0)
MCHC: 32.5 g/dL (ref 30.0–36.0)
MCV: 91.6 fL (ref 78.0–100.0)
MCV: 92.3 fL (ref 78.0–100.0)
Platelets: 123 10*3/uL — ABNORMAL LOW (ref 150–400)
RBC: 2.87 MIL/uL — ABNORMAL LOW (ref 4.22–5.81)
RDW: 15.5 % (ref 11.5–15.5)
RDW: 15.6 % — ABNORMAL HIGH (ref 11.5–15.5)
WBC: 6.3 10*3/uL (ref 4.0–10.5)

## 2013-07-21 LAB — HIV ANTIBODY (ROUTINE TESTING W REFLEX): HIV: NONREACTIVE

## 2013-07-21 LAB — POCT I-STAT 7, (LYTES, BLD GAS, ICA,H+H)
Acid-base deficit: 8 mmol/L — ABNORMAL HIGH (ref 0.0–2.0)
Bicarbonate: 17.7 mEq/L — ABNORMAL LOW (ref 20.0–24.0)
Calcium, Ion: 1.12 mmol/L — ABNORMAL LOW (ref 1.13–1.30)
HCT: 20 % — ABNORMAL LOW (ref 39.0–52.0)
O2 Saturation: 99 %
Patient temperature: 35.9
pCO2 arterial: 33.4 mmHg — ABNORMAL LOW (ref 35.0–45.0)
pO2, Arterial: 145 mmHg — ABNORMAL HIGH (ref 80.0–100.0)

## 2013-07-21 LAB — GLUCOSE, CAPILLARY
Glucose-Capillary: 84 mg/dL (ref 70–99)
Glucose-Capillary: 128 mg/dL — ABNORMAL HIGH (ref 70–99)

## 2013-07-21 LAB — APTT: aPTT: 40 seconds — ABNORMAL HIGH (ref 24–37)

## 2013-07-21 LAB — PREALBUMIN: Prealbumin: 4.5 mg/dL — ABNORMAL LOW (ref 17.0–34.0)

## 2013-07-21 LAB — AMYLASE: Amylase: 46 U/L (ref 0–105)

## 2013-07-21 LAB — HEMOGLOBIN AND HEMATOCRIT, BLOOD
HCT: 22.9 % — ABNORMAL LOW (ref 39.0–52.0)
Hemoglobin: 7.8 g/dL — ABNORMAL LOW (ref 13.0–17.0)

## 2013-07-21 LAB — CORTISOL: Cortisol, Plasma: >150 ug/dL

## 2013-07-21 LAB — TROPONIN I: Troponin I: 1.42 ng/mL (ref <0.30)

## 2013-07-21 LAB — PROTIME-INR: INR: 2.49 — ABNORMAL HIGH (ref 0.00–1.49)

## 2013-07-21 SURGERY — LAPAROTOMY, EXPLORATORY
Anesthesia: General | Site: Abdomen

## 2013-07-21 MED ORDER — PIPERACILLIN-TAZOBACTAM 3.375 G IVPB
3.3750 g | Freq: Three times a day (TID) | INTRAVENOUS | Status: DC
  Administered 2013-07-21 – 2013-08-04 (×43): 3.375 g via INTRAVENOUS
  Filled 2013-07-21 (×48): qty 50

## 2013-07-21 MED ORDER — SODIUM CHLORIDE 0.9 % IV SOLN
1.0000 g | Freq: Once | INTRAVENOUS | Status: DC
  Filled 2013-07-21: qty 10

## 2013-07-21 MED ORDER — SODIUM BICARBONATE 8.4 % IV SOLN
INTRAVENOUS | Status: DC | PRN
  Administered 2013-07-21 (×2): 50 meq via INTRAVENOUS

## 2013-07-21 MED ORDER — LACTATED RINGERS IV SOLN
INTRAVENOUS | Status: DC | PRN
  Administered 2013-07-21: 14:00:00 via INTRAVENOUS

## 2013-07-21 MED ORDER — PROMETHAZINE HCL 25 MG/ML IJ SOLN
12.5000 mg | Freq: Once | INTRAMUSCULAR | Status: AC
  Administered 2013-07-21: 12.5 mg via INTRAVENOUS

## 2013-07-21 MED ORDER — CHLORHEXIDINE GLUCONATE 0.12 % MT SOLN
15.0000 mL | Freq: Two times a day (BID) | OROMUCOSAL | Status: DC
  Administered 2013-07-21 – 2013-08-05 (×29): 15 mL via OROMUCOSAL
  Filled 2013-07-21 (×27): qty 15

## 2013-07-21 MED ORDER — FENTANYL CITRATE 0.05 MG/ML IJ SOLN
INTRAMUSCULAR | Status: AC
  Filled 2013-07-21: qty 2

## 2013-07-21 MED ORDER — FENTANYL BOLUS VIA INFUSION
50.0000 ug | INTRAVENOUS | Status: DC | PRN
  Filled 2013-07-21 (×2): qty 100

## 2013-07-21 MED ORDER — NOREPINEPHRINE BITARTRATE 1 MG/ML IJ SOLN: 2.0000 ug/min | INTRAVENOUS | Status: DC

## 2013-07-21 MED ORDER — FENTANYL CITRATE 0.05 MG/ML IJ SOLN
0.0000 ug/h | INTRAMUSCULAR | Status: DC
  Administered 2013-07-22 – 2013-07-25 (×3): 50 ug/h via INTRAVENOUS
  Filled 2013-07-21 (×4): qty 50

## 2013-07-21 MED ORDER — 0.9 % SODIUM CHLORIDE (POUR BTL) OPTIME
TOPICAL | Status: DC | PRN
  Administered 2013-07-21 (×3): 1000 mL

## 2013-07-21 MED ORDER — ETOMIDATE 2 MG/ML IV SOLN
20.0000 mg | Freq: Once | INTRAVENOUS | Status: AC
  Administered 2013-07-21: 20 mg via INTRAVENOUS

## 2013-07-21 MED ORDER — SODIUM CHLORIDE 0.9 % IJ SOLN
3.0000 mL | Freq: Two times a day (BID) | INTRAMUSCULAR | Status: DC
  Administered 2013-07-22 – 2013-07-24 (×4): 3 mL via INTRAVENOUS

## 2013-07-21 MED ORDER — SODIUM CHLORIDE 0.9 % IV SOLN
1.0000 g | Freq: Once | INTRAVENOUS | Status: DC
  Administered 2013-07-21: 1 g via INTRAVENOUS
  Filled 2013-07-21: qty 10

## 2013-07-21 MED ORDER — METOPROLOL TARTRATE 1 MG/ML IV SOLN: 5.0000 mg | INTRAVENOUS | Status: DC | PRN

## 2013-07-21 MED ORDER — PROMETHAZINE HCL 25 MG/ML IJ SOLN: 12.5000 mg | Freq: Four times a day (QID) | INTRAMUSCULAR | Status: DC | PRN

## 2013-07-21 MED ORDER — METRONIDAZOLE IN NACL 5-0.79 MG/ML-% IV SOLN
500.0000 mg | Freq: Three times a day (TID) | INTRAVENOUS | Status: DC
  Administered 2013-07-21 – 2013-07-25 (×13): 500 mg via INTRAVENOUS
  Filled 2013-07-21 (×15): qty 100

## 2013-07-21 MED ORDER — PROMETHAZINE HCL 25 MG/ML IJ SOLN
INTRAMUSCULAR | Status: AC
  Filled 2013-07-21: qty 1

## 2013-07-21 MED ORDER — FENTANYL CITRATE 0.05 MG/ML IJ SOLN
INTRAMUSCULAR | Status: DC | PRN
  Administered 2013-07-21 (×2): 250 ug via INTRAVENOUS

## 2013-07-21 MED ORDER — SODIUM BICARBONATE 8.4 % IV SOLN
INTRAVENOUS | Status: DC
  Administered 2013-07-21 (×2): via INTRAVENOUS
  Filled 2013-07-21 (×4): qty 150

## 2013-07-21 MED ORDER — ALBUMIN HUMAN 5 % IV SOLN
INTRAVENOUS | Status: DC | PRN
  Administered 2013-07-21 (×2): via INTRAVENOUS

## 2013-07-21 MED ORDER — VANCOMYCIN HCL IN DEXTROSE 1-5 GM/200ML-% IV SOLN
1000.0000 mg | Freq: Two times a day (BID) | INTRAVENOUS | Status: DC
  Administered 2013-07-21 – 2013-07-22 (×3): 1000 mg via INTRAVENOUS
  Filled 2013-07-21 (×5): qty 200

## 2013-07-21 MED ORDER — CALCIUM CHLORIDE 10 % IV SOLN
1.0000 g | Freq: Once | INTRAVENOUS | Status: DC
  Filled 2013-07-21: qty 10

## 2013-07-21 MED ORDER — SODIUM BICARBONATE 8.4 % IV SOLN
INTRAVENOUS | Status: AC
  Administered 2013-07-21: 100 meq
  Filled 2013-07-21: qty 100

## 2013-07-21 MED ORDER — SODIUM CHLORIDE 0.9 % IV SOLN
INTRAVENOUS | Status: AC
  Administered 2013-07-21 (×2): 125 mL/h via INTRAVENOUS
  Administered 2013-07-21: 21:00:00 via INTRAVENOUS
  Administered 2013-07-21: 125 mL/h via INTRAVENOUS
  Administered 2013-07-22: 07:00:00 via INTRAVENOUS

## 2013-07-21 MED ORDER — MIDAZOLAM HCL 5 MG/5ML IJ SOLN
INTRAMUSCULAR | Status: DC | PRN
  Administered 2013-07-21 (×2): 2 mg via INTRAVENOUS

## 2013-07-21 MED ORDER — ROCURONIUM BROMIDE 100 MG/10ML IV SOLN
INTRAVENOUS | Status: DC | PRN
  Administered 2013-07-21: 20 mg via INTRAVENOUS
  Administered 2013-07-21: 50 mg via INTRAVENOUS

## 2013-07-21 MED ORDER — DEXTROSE 5 % IV SOLN
0.0000 ug/min | INTRAVENOUS | Status: DC
  Administered 2013-07-21: 15 ug/min via INTRAVENOUS
  Administered 2013-07-22: 2 ug/min via INTRAVENOUS
  Administered 2013-07-25: 3 ug/min via INTRAVENOUS
  Filled 2013-07-21 (×6): qty 4

## 2013-07-21 MED ORDER — FENTANYL CITRATE 0.05 MG/ML IJ SOLN
INTRAMUSCULAR | Status: AC
  Administered 2013-07-21: 100 ug
  Filled 2013-07-21: qty 2

## 2013-07-21 MED ORDER — MORPHINE SULFATE 2 MG/ML IJ SOLN
1.0000 mg | INTRAMUSCULAR | Status: DC | PRN
  Administered 2013-07-21 (×2): 1 mg via INTRAVENOUS
  Filled 2013-07-21: qty 1

## 2013-07-21 MED ORDER — IOHEXOL 350 MG/ML SOLN
100.0000 mL | Freq: Once | INTRAVENOUS | Status: AC | PRN
  Administered 2013-07-21: 100 mL via INTRAVENOUS

## 2013-07-21 MED ORDER — MIDAZOLAM HCL 2 MG/2ML IJ SOLN
INTRAMUSCULAR | Status: AC
  Administered 2013-07-21: 4 mg
  Filled 2013-07-21: qty 4

## 2013-07-21 MED ORDER — HYDROCORTISONE SOD SUCCINATE 100 MG IJ SOLR
50.0000 mg | Freq: Four times a day (QID) | INTRAMUSCULAR | Status: DC
  Administered 2013-07-21 – 2013-07-22 (×5): 50 mg via INTRAVENOUS
  Filled 2013-07-21 (×8): qty 1

## 2013-07-21 MED ORDER — FLUCONAZOLE IN SODIUM CHLORIDE 200-0.9 MG/100ML-% IV SOLN
200.0000 mg | INTRAVENOUS | Status: DC
  Administered 2013-07-21 – 2013-08-03 (×14): 200 mg via INTRAVENOUS
  Filled 2013-07-21 (×15): qty 100

## 2013-07-21 MED ORDER — FENTANYL CITRATE 0.05 MG/ML IJ SOLN
50.0000 ug | Freq: Once | INTRAMUSCULAR | Status: AC
  Administered 2013-07-21: 50 ug via INTRAVENOUS
  Filled 2013-07-21: qty 2

## 2013-07-21 MED ORDER — BIOTENE DRY MOUTH MT LIQD
15.0000 mL | Freq: Four times a day (QID) | OROMUCOSAL | Status: DC
  Administered 2013-07-22 – 2013-08-05 (×58): 15 mL via OROMUCOSAL

## 2013-07-21 MED ORDER — INSULIN ASPART 100 UNIT/ML ~~LOC~~ SOLN
0.0000 [IU] | SUBCUTANEOUS | Status: DC
Start: 2013-07-21 — End: 2013-08-01
  Administered 2013-07-21 (×2): 1 [IU] via SUBCUTANEOUS
  Administered 2013-07-24: 2 [IU] via SUBCUTANEOUS
  Administered 2013-07-25 – 2013-07-28 (×13): 1 [IU] via SUBCUTANEOUS
  Administered 2013-07-28: 2 [IU] via SUBCUTANEOUS
  Administered 2013-07-28 – 2013-07-30 (×10): 1 [IU] via SUBCUTANEOUS
  Administered 2013-07-30: 2 [IU] via SUBCUTANEOUS
  Administered 2013-07-30 (×2): 1 [IU] via SUBCUTANEOUS
  Administered 2013-07-30: 2 [IU] via SUBCUTANEOUS
  Administered 2013-07-31 – 2013-08-01 (×8): 1 [IU] via SUBCUTANEOUS

## 2013-07-21 MED ORDER — DEXTROSE 5 % IV SOLN
1.0000 g | Freq: Three times a day (TID) | INTRAVENOUS | Status: DC
  Administered 2013-07-21: 1 g via INTRAVENOUS
  Filled 2013-07-21 (×3): qty 1

## 2013-07-21 MED ORDER — VITAMIN K1 10 MG/ML IJ SOLN
5.0000 mg | Freq: Once | INTRAVENOUS | Status: AC
  Administered 2013-07-21: 5 mg via INTRAVENOUS
  Filled 2013-07-21: qty 0.5

## 2013-07-21 MED ORDER — SODIUM CHLORIDE 0.9 % IJ SOLN: 3.0000 mL | INTRAMUSCULAR | Status: DC | PRN

## 2013-07-21 MED ORDER — SODIUM CHLORIDE 0.9 % IJ SOLN
10.0000 mL | Freq: Two times a day (BID) | INTRAMUSCULAR | Status: DC
  Administered 2013-07-21 – 2013-08-06 (×28): 10 mL via INTRAVENOUS

## 2013-07-21 MED ORDER — SODIUM CHLORIDE 0.9 % IJ SOLN
10.0000 mL | INTRAMUSCULAR | Status: DC | PRN
  Administered 2013-07-21 – 2013-07-29 (×4): 10 mL via INTRAVENOUS
  Administered 2013-08-06: 19:00:00 20 mL via INTRAVENOUS
  Administered 2013-08-07 – 2013-08-08 (×3): 10 mL via INTRAVENOUS

## 2013-07-21 SURGICAL SUPPLY — 51 items
BLADE SURG ROTATE 9660 (MISCELLANEOUS) IMPLANT
CANISTER SUCTION 2500CC (MISCELLANEOUS) ×3 IMPLANT
CHLORAPREP W/TINT 26ML (MISCELLANEOUS) ×3 IMPLANT
COVER MAYO STAND STRL (DRAPES) IMPLANT
COVER SURGICAL LIGHT HANDLE (MISCELLANEOUS) ×3 IMPLANT
DRAPE LAPAROSCOPIC ABDOMINAL (DRAPES) ×3 IMPLANT
DRAPE PROXIMA HALF (DRAPES) IMPLANT
DRAPE UTILITY 15X26 W/TAPE STR (DRAPE) ×6 IMPLANT
DRAPE WARM FLUID 44X44 (DRAPE) ×3 IMPLANT
DRSG OPSITE POSTOP 4X10 (GAUZE/BANDAGES/DRESSINGS) IMPLANT
DRSG OPSITE POSTOP 4X8 (GAUZE/BANDAGES/DRESSINGS) IMPLANT
ELECT BLADE 6.5 EXT (BLADE) IMPLANT
ELECT CAUTERY BLADE 6.4 (BLADE) ×2 IMPLANT
ELECT REM PT RETURN 9FT ADLT (ELECTROSURGICAL) ×3
ELECTRODE REM PT RTRN 9FT ADLT (ELECTROSURGICAL) ×2 IMPLANT
GLOVE BIO SURGEON STRL SZ7.5 (GLOVE) ×4 IMPLANT
GLOVE BIOGEL PI IND STRL 7.5 (GLOVE) ×1 IMPLANT
GLOVE BIOGEL PI INDICATOR 7.5 (GLOVE) ×1
GLOVE SS BIOGEL STRL SZ 7.5 (GLOVE) ×2 IMPLANT
GLOVE SUPERSENSE BIOGEL SZ 7.5 (GLOVE) ×2
GLOVE SURG ORTHO 8.0 STRL STRW (GLOVE) ×3 IMPLANT
GOWN STRL NON-REIN LRG LVL3 (GOWN DISPOSABLE) ×6 IMPLANT
GOWN STRL REIN XL XLG (GOWN DISPOSABLE) ×3 IMPLANT
KIT BASIN OR (CUSTOM PROCEDURE TRAY) ×3 IMPLANT
KIT ROOM TURNOVER OR (KITS) ×3 IMPLANT
LIGASURE IMPACT 36 18CM CVD LR (INSTRUMENTS) ×2 IMPLANT
NS IRRIG 1000ML POUR BTL (IV SOLUTION) ×6 IMPLANT
PACK GENERAL/GYN (CUSTOM PROCEDURE TRAY) ×3 IMPLANT
PAD ARMBOARD 7.5X6 YLW CONV (MISCELLANEOUS) ×3 IMPLANT
PENCIL BUTTON HOLSTER BLD 10FT (ELECTRODE) IMPLANT
RELOAD PROXIMATE 75MM BLUE (ENDOMECHANICALS) ×3 IMPLANT
RELOAD STAPLE 75 3.8 BLU REG (ENDOMECHANICALS) IMPLANT
SPECIMEN JAR LARGE (MISCELLANEOUS) IMPLANT
SPONGE GAUZE 4X4 12PLY (GAUZE/BANDAGES/DRESSINGS) ×2 IMPLANT
SPONGE LAP 18X18 X RAY DECT (DISPOSABLE) IMPLANT
STAPLER PROXIMATE 75MM BLUE (STAPLE) ×2 IMPLANT
STAPLER VISISTAT 35W (STAPLE) ×3 IMPLANT
SUCTION POOLE TIP (SUCTIONS) ×1 IMPLANT
SUT PDS AB 1 TP1 96 (SUTURE) ×6 IMPLANT
SUT SILK 2 0 SH CR/8 (SUTURE) ×3 IMPLANT
SUT SILK 2 0 TIES 10X30 (SUTURE) ×3 IMPLANT
SUT SILK 3 0 SH CR/8 (SUTURE) ×3 IMPLANT
SUT SILK 3 0 TIES 10X30 (SUTURE) ×3 IMPLANT
SUT VIC AB 3-0 SH 18 (SUTURE) IMPLANT
TAPE CLOTH SURG 6X10 WHT LF (GAUZE/BANDAGES/DRESSINGS) ×2 IMPLANT
TOWEL OR 17X24 6PK STRL BLUE (TOWEL DISPOSABLE) ×1 IMPLANT
TOWEL OR 17X26 10 PK STRL BLUE (TOWEL DISPOSABLE) ×3 IMPLANT
TOWEL OR NON WOVEN STRL DISP B (DISPOSABLE) ×2 IMPLANT
TRAY FOLEY CATH 16FRSI W/METER (SET/KITS/TRAYS/PACK) ×1 IMPLANT
TUBE CONNECTING 12X1/4 (SUCTIONS) IMPLANT
YANKAUER SUCT BULB TIP NO VENT (SUCTIONS) IMPLANT

## 2013-07-21 NOTE — Progress Notes (Signed)
ANTIBIOTIC CONSULT NOTE - Follow Up  Pharmacy Consult for vancomycin - adding Zosyn and Fluconazole Indication: rule out pneumonia, bowel obstruction  No Known Allergies  Patient Measurements: Height: 6' (182.9 cm) Weight: 157 lb 12.8 oz (71.578 kg) IBW/kg (Calculated) : 77.6  Vital Signs: Temp: 97.4 F (36.3 C) (12/08 0458) Temp src: Axillary (12/08 0458) BP: 103/66 mmHg (12/08 0458) Pulse Rate: 116 (12/08 0458) Intake/Output from previous day: 12/07 0701 - 12/08 0700 In: 750 [P.O.:100; I.V.:600; IV Piggyback:50] Out: 750 [Urine:550; Emesis/NG output:200] Intake/Output from this shift:  Labs:  Recent Labs  07/19/13 0400 07/20/13 0440 07/21/13 0720  WBC 14.5* 10.4 11.2*  HGB 9.4* 8.8* 10.8*  PLT 148* 150 174  CREATININE 0.87 0.87 1.05   Estimated Creatinine Clearance: 60.6 ml/min (by C-G formula based on Cr of 1.05).  Microbiology: Recent Results (from the past 720 hour(s))  CULTURE, EXPECTORATED SPUTUM-ASSESSMENT     Status: None   Collection Time    06/21/13  1:00 PM      Result Value Range Status   Specimen Description SPUTUM   Final   Special Requests Normal   Final   Sputum evaluation     Final   Value: THIS SPECIMEN IS ACCEPTABLE. RESPIRATORY CULTURE REPORT TO FOLLOW.   Report Status 06/21/2013 FINAL   Final  CULTURE, RESPIRATORY (NON-EXPECTORATED)     Status: None   Collection Time    06/21/13  1:00 PM      Result Value Range Status   Specimen Description SPUTUM   Final   Special Requests NONE   Final   Gram Stain     Final   Value: NO WBC SEEN     NO SQUAMOUS EPITHELIAL CELLS SEEN     FEW GRAM POSITIVE COCCI     IN PAIRS     Performed at [facility]   Culture     Final   Value: NORMAL OROPHARYNGEAL FLORA     Performed at [facility]   Report Status 06/24/2013 FINAL   Final  MRSA PCR SCREENING     Status: None   Collection Time    07/06/13 10:25 PM      Result Value Range Status   MRSA by PCR NEGATIVE  NEGATIVE Final   Comment:            The GeneXpert MRSA Assay (FDA     approved for NASAL specimens     only), is one component of a     comprehensive MRSA colonization     surveillance program. It is not     intended to diagnose MRSA     infection nor to guide or     monitor treatment for     MRSA infections.  CLOSTRIDIUM DIFFICILE BY PCR     Status: None   Collection Time    07/08/13  4:55 PM      Result Value Range Status   C difficile by pcr NEGATIVE  NEGATIVE Final  STOOL CULTURE     Status: None   Collection Time    07/08/13  4:55 PM      Result Value Range Status   Specimen Description STOOL   Final   Special Requests Normal   Final   Culture     Final   Value: NO SALMONELLA, SHIGELLA, CAMPYLOBACTER, YERSINIA, OR E.COLI 0157:H7 ISOLATED     Performed at [facility]   Report Status 07/12/2013 FINAL   Final   Medical History: Past  Medical History  Diagnosis Date  . CEREBROVASCULAR ACCIDENT, HX OF 03/02/2007  . CHEST PAIN 01/31/2010  . CHRONIC OBSTRUCTIVE PULMONARY DISEASE, ACUTE EXACERBATION 02/18/2009  . COPD 03/02/2007  . CORONARY ARTERY DISEASE 03/02/2007  . DEPRESSION 09/23/2007    anxiety  . DISEASE, PERIPHERAL VASCULAR NEC 03/02/2007  . DIVERTICULOSIS, COLON 09/23/2007  . FATIGUE 01/22/2009  . GERD 03/02/2007  . GLUCOSE INTOLERANCE 09/23/2007  . HEMORRHOIDS 10/09/2007  . HIATAL HERNIA 10/09/2007  . HYPERCHOLESTEROLEMIA 10/09/2007  . HYPERTENSION 09/23/2007  . HYPOKALEMIA 10/09/2007  . MESENTERIC VASCULAR INSUFFICIENCY 07/06/2008  . MITRAL REGURGITATION 11/27/2008  . MI 10/09/2007  . PERIPHERAL VASCULAR DISEASE 09/23/2007  . PUD 10/09/2007  . RENAL ARTERY STENOSIS 03/02/2007  . TOBACCO ABUSE 04/20/2010  . 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 URINARY RETENTION 01/22/2009  . VITAMIN B12 DEFICIENCY 01/03/2010  . WRIST PAIN, RIGHT 06/06/2010  . Impaired glucose tolerance 01/29/2011  . Hearing loss in left ear 01/30/2011  . Blindness of both eyes 01/30/2011  . Hyperlipidemia   .  Anemia, iron deficiency   . Atrial flutter   . HTN (hypertension) 03/21/2011  . AVM (arteriovenous malformation) 09/2010    stomach and duodenum  . PUD (peptic ulcer disease) 09/2010    pyloric ulcer   Medications:  Scheduled: (Antibiotics)  . fluconazole (DIFLUCAN) IV  200 mg Intravenous Q24H  . metronidazole  500 mg Intravenous Q8H  . piperacillin-tazobactam (ZOSYN)  IV  3.375 g Intravenous Q8H  . vancomycin  1,000 mg Intravenous Q12H   Assessment: 78yo male admitted 12/5 admitted for likely d/t partial duodenal obstruction, tonight vomited x2, to begin IV ABX.  He now presents with diffuse gut ischemia and plans to broaden his antibiotic coverage with Zosyn and Fluconazole.  He is currently receiving Amiodarone which has the potential to cause QTc prolongation with Fluconazole.  Risk evaluated and due to suspicion of necrosis, will continue with IV antibiotic plans.  He has good renal function with an estimated crcl of 28ml/min.    Goal of Therapy:  Vancomycin trough level 15-20 mcg/ml  Plan:  Continue IV vancomycin as previously ordered Add Zosyn 3.375 gm every 8 hours Add Fluconazole 200mg  IV every 24 hours Monitor renal function, cultures and response to therapy.  [name], PharmD., MS Clinical Pharmacist Pager:  [phone] Thank you for allowing pharmacy to be part of this patients care team. 07/21/2013,9:45 AM

## 2013-07-21 NOTE — Progress Notes (Signed)
Patient vomited x 2 tonight. clear fl. C/O of abdo feels gasie. Not passing gas in a while patient stated. abdo is soft tender and sl distended. Has dec. Bile sounds. Dr Ricki Miller notified of the above and that patient has had zofran 4 mg I.. Morphine 1 mg I.V. And xanax 0.25 mg at 2100 tonight. See orders for Phenergan 12.5mg  I.V.

## 2013-07-21 NOTE — Progress Notes (Addendum)
H.R. In the 120's did 12-lead EKG confirms At. Fib. Patient has hx of At fib. Patient having abdo. Pain 9/10 R.N. Aware and T Callahan N.P. Page and aware order to give a.m. Dose of  Amiodarone and Coreg now . And can inc. Morphine to 2 mg I.V.  Also aware of ct scan results.

## 2013-07-21 NOTE — Procedures (Addendum)
Central Venous Catheter Insertion Procedure Note [name] [id number] Sep 21, 1934  Procedure: Insertion of Central Venous Catheter Indications: Assessment of intravascular volume, Drug and/or fluid administration and Frequent blood sampling  Procedure Details Consent: Risks of procedure as well as the alternatives and risks of each were explained to the (patient/caregiver).  Consent for procedure obtained. Time Out: Verified patient identification, verified procedure, site/side was marked, verified correct patient position, special equipment/implants available, medications/allergies/relevent history reviewed, required imaging and test results available.  Performed  Maximum sterile technique was used including antiseptics, cap, gloves, gown, hand hygiene, mask and sheet. Skin prep: Chlorhexidine; local anesthetic administered A antimicrobial bonded/coated triple lumen catheter was placed in the left internal jugular vein using the Seldinger technique. Ultrasound guidance used.yes Catheter placed to 20 cm. Blood aspirated via all 3 ports and then flushed x 3. Line sutured x 2 and dressing applied.  Evaluation Blood flow good Complications: No apparent complications Patient did tolerate procedure well. Chest X-ray ordered to verify placement.  CXR: normal.   US guidance    Mcarthur Rossetti. Tyson Alias, MD, FACP Pgr: [phone] [facility]

## 2013-07-21 NOTE — Consult Note (Signed)
PULMONARY  / CRITICAL CARE MEDICINE  Name: [name] MRN: [id number] DOB: [birth year]    ADMISSION DATE:  07/18/2013 CONSULTATION DATE: 12-8  REFERRING MD :  [facility] PRIMARY SERVICE: PCCM  CHIEF COMPLAINT:  ARF  BRIEF PATIENT DESCRIPTION:  78 yo male with with a plethora of health issues admitted with abdominal pin and CT abd demonstrates extensive ischemia changes, small bowel distension and was found to be in shock and respiratory failure and was urgently intubated and transferred to ICU. Family was uncertain concerning code status at this time therefore he will be a full code until further meeting with family.   SIGNIFICANT EVENTS / STUDIES:  12-8 intuabted, shock, surgical evaluation 12-8 DNR established   LINES / TUBES: 12- 8 OTT>> 12-8 Rt fem cvl>>12-8 12-8 Lt IJ cvl>>  CULTURES: 12-8 bc x 2>> 12-8 sputum>> 12-8 uc>>  ANTIBIOTICS: 12-8 vanc>> 12-8 diflucan>> 12-8 pip tazo>>  HISTORY OF PRESENT ILLNESS:  78 yr old presented with Nausea and vomiting in adult with evidence of duodenitis on CT scan causing likely due to partial duodenal obstruction on CT scan ( recent Endoscopy 11-26: Three medium sized gastric antral ulcers. Single, large, gastric fundus ulcer , Angiodysplastic lesion in the gastric antrum & Duodenitis) . He had recently been admitted twice in the last 1 month once for atrial fibrillation and norvirus antritis and one week later for antritis and peptic ulcer disease.  Initial CT scan suggested mechanical duodenal obstruction versus duodenal inflammation. Was treated conservatively with bowel rest, PPI, GI was consulted. Underwent EGD on 07/20/2013 with changes were suggestive of severe erosive ulcerative gastritis, duodenitis and jujinitis - subsequently CT angiogram was ordered on 07/20/2013, suggesting that patient had diffuse calcifications of his mesenteric circulation in that his CT scan look worse for small bowel inflammation/ was suspiciou necrosis now.  Found with shock and respiratory failure and was urgently intubated and transferred to ICU. Family was uncertain concerning code status at this time therefore he will be a full code until further meeting with family.   PAST MEDICAL HISTORY :  Past Medical History  Diagnosis Date  . CEREBROVASCULAR ACCIDENT, HX OF 03/02/2007  . CHEST PAIN 01/31/2010  . CHRONIC OBSTRUCTIVE PULMONARY DISEASE, ACUTE EXACERBATION 02/18/2009  . COPD 03/02/2007  . CORONARY ARTERY DISEASE 03/02/2007  . DEPRESSION 09/23/2007    anxiety  . DISEASE, PERIPHERAL VASCULAR NEC 03/02/2007  . DIVERTICULOSIS, COLON 09/23/2007  . FATIGUE 01/22/2009  . GERD 03/02/2007  . GLUCOSE INTOLERANCE 09/23/2007  . HEMORRHOIDS 10/09/2007  . HIATAL HERNIA 10/09/2007  . HYPERCHOLESTEROLEMIA 10/09/2007  . HYPERTENSION 09/23/2007  . HYPOKALEMIA 10/09/2007  . MESENTERIC VASCULAR INSUFFICIENCY 07/06/2008  . MITRAL REGURGITATION 11/27/2008  . MI 10/09/2007  . PERIPHERAL VASCULAR DISEASE 09/23/2007  . PUD 10/09/2007  . RENAL ARTERY STENOSIS 03/02/2007  . TOBACCO ABUSE 04/20/2010  . 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 URINARY RETENTION 01/22/2009  . VITAMIN B12 DEFICIENCY 01/03/2010  . WRIST PAIN, RIGHT 06/06/2010  . Impaired glucose tolerance 01/29/2011  . Hearing loss in left ear 01/30/2011  . Blindness of both eyes 01/30/2011  . Hyperlipidemia   . Anemia, iron deficiency   . Atrial flutter   . HTN (hypertension) 03/21/2011  . AVM (arteriovenous malformation) 09/2010    stomach and duodenum  . PUD (peptic ulcer disease) 09/2010    pyloric ulcer   Past Surgical History  Procedure Laterality Date  . Coronary artery bypass graft  1995    LIMA-LAD, SVG-OM, SVG-D, SVG-PDA  . Renal  artery stenting  2004    bilateral  . S/p multiple le vascular bypass      includine fem-pop x 2 LLE  . S/p bilateral cea    . Coronary angioplasty with stent placement  2010    DES x 2 SVG-OM, cutting balloon PTCA SVG-PDA for ISR  . Appendectomy    . S/p lumbar  disc surgury      x2  . S/p rle fem bypass feb 2011    . Cardiac catheterization    . Pr vein bypass graft,aorto-fem-pop    . Carotid endarterectomy    . Coronary angioplasty with stent placement  2011    DES to SVG-DIAG, SVG-PDA  . Esophagogastroduodenoscopy    . Colonoscopy    . Esophagogastroduodenoscopy N/A 07/09/2013    Procedure: ESOPHAGOGASTRODUODENOSCOPY (EGD);  Surgeon: Meryl Dare, MD;  Location: [facility] ENDOSCOPY;  Service: Endoscopy;  Laterality: N/A;  . Esophagogastroduodenoscopy N/A 07/20/2013    Procedure: ESOPHAGOGASTRODUODENOSCOPY (EGD);  Surgeon: Hart Carwin, MD;  Location: [facility] ENDOSCOPY;  Service: Endoscopy;  Laterality: N/A;   Prior to Admission medications   Medication Sig Start Date End Date Taking? Authorizing Provider  acetaminophen (TYLENOL) 500 MG tablet Take 500 mg by mouth every 6 (six) hours as needed for moderate pain.    Yes Historical Provider, MD  ALPRAZolam (XANAX) 0.25 MG tablet Take 1 tablet (0.25 mg total) by mouth 2 (two) times daily as needed for anxiety. 07/16/13  Yes Corwin Levins, MD  amiodarone (PACERONE) 400 MG tablet Take 200 mg by mouth daily.   Yes Historical Provider, MD  atorvastatin (LIPITOR) 40 MG tablet Take 40 mg by mouth daily.   Yes Historical Provider, MD  carvedilol (COREG) 3.125 MG tablet Take 1 tablet (3.125 mg total) by mouth 2 (two) times daily with a meal. 06/28/13  Yes Marinda Elk, MD  cilostazol (PLETAL) 100 MG tablet Take 100 mg by mouth 2 (two) times daily.   Yes Historical Provider, MD  ferrous sulfate 325 (65 FE) MG tablet Take 325 mg by mouth 2 (two) times daily with a meal.    Yes Historical Provider, MD  furosemide (LASIX) 20 MG tablet Take 1 tablet (20 mg total) by mouth daily. 07/12/13  Yes Belkys A Regalado, MD  nitroGLYCERIN (NITROSTAT) 0.4 MG SL tablet Place 1 tablet (0.4 mg total) under the tongue every 5 (five) minutes as needed for chest pain. 12/17/12  Yes Rhetta Mura, MD  pantoprazole (PROTONIX) 40  MG tablet Take 1 tablet (40 mg total) by mouth 2 (two) times daily before a meal. 07/12/13  Yes Belkys A Regalado, MD  potassium chloride 20 MEQ TBCR Take 20 mEq by mouth daily. 07/12/13  Yes Belkys A Regalado, MD  promethazine (PHENERGAN) 25 MG tablet Take 1 tablet (25 mg total) by mouth every 8 (eight) hours as needed for nausea or vomiting. 07/16/13  Yes Corwin Levins, MD  ranitidine (ZANTAC) 150 MG tablet Take 1 tablet (150 mg total) by mouth at bedtime. 01/03/13  Yes Corwin Levins, MD  sertraline (ZOLOFT) 100 MG tablet Take 1 tablet (100 mg total) by mouth daily. 07/16/13  Yes Corwin Levins, MD  feeding supplement, RESOURCE BREEZE, (RESOURCE BREEZE) LIQD Take 1 Container by mouth 2 (two) times daily between meals. 07/12/13   Belkys A Regalado, MD   No Known Allergies  FAMILY HISTORY:  Family History  Problem Relation Age of Onset  . Colon cancer Mother 27  . Heart disease Father   .  Cancer Father   . Heart disease Sister   . Prostate cancer Brother     prostate cancer  . Stroke Sister    SOCIAL HISTORY:  reports that he has been smoking Cigarettes.  He has a 60 pack-year smoking history. He has never used smokeless tobacco. He reports that he does not drink alcohol or use illicit drugs.  REVIEW OF SYSTEMS:  NA  SUBJECTIVE:   VITAL SIGNS: Temp:  [97.4 F (36.3 C)-98.1 F (36.7 C)] 97.4 F (36.3 C) (12/08 0458) Pulse Rate:  [91-117] 116 (12/08 0458) Resp:  [18-40] 18 (12/08 0458) BP: (103-146)/(52-75) 103/66 mmHg (12/08 0458) SpO2:  [87 %-97 %] 94 % (12/08 0458) Weight:  [157 lb 12.8 oz (71.578 kg)] 157 lb 12.8 oz (71.578 kg) (12/08 0458) HEMODYNAMICS:   VENTILATOR SETTINGS:   INTAKE / OUTPUT: Intake/Output     12/07 0701 - 12/08 0700 12/08 0701 - 12/09 0700   P.O. 100    I.V. (mL/kg) 600 (8.4)    IV Piggyback 50    Total Intake(mL/kg) 750 (10.5)    Urine (mL/kg/hr) 550 (0.3)    Emesis/NG output 200 (0.1)    Total Output 750     Net 0          Urine Occurrence 3 x       PHYSICAL EXAMINATION: General:  Elderly WM in acute distress and intubated, near arrest Neuro: Followed commands prior to intubation, confused, moaning HEENT: Edentulous, No JVD/LAN  Cardiovascular:  HSR RRR Lungs:  Rhonchi mild diffuse Abdomen:  Tense/Tender, OGT with bilious drainage Musculoskeletal:  Wasted musculature Skin:  Rt lower ext  Mottled mild upper fem region  LABS:  CBC  Recent Labs Lab 07/19/13 0400 07/20/13 0440 07/21/13 0720  WBC 14.5* 10.4 11.2*  HGB 9.4* 8.8* 10.8*  HCT 28.6* 26.8* 32.7*  PLT 148* 150 174   Coag's No results found for this basename: APTT, INR,  in the last 168 hours BMET  Recent Labs Lab 07/19/13 0400 07/20/13 0440 07/21/13 0720  NA 135 134* 134*  K 3.4* 3.8 5.8*  CL 98 98 100  CO2 25 21 15*  BUN 21 23 24*  CREATININE 0.87 0.87 1.05  GLUCOSE 81 60* 98   Electrolytes  Recent Labs Lab 07/19/13 0400 07/20/13 0440 07/21/13 0720  CALCIUM 8.6 8.5 8.9  MG 1.7  --   --   PHOS 2.7  --   --    Sepsis Markers  Recent Labs Lab 07/18/13 1447 07/18/13 1512  LATICACIDVEN 1.36 1.3   ABG No results found for this basename: PHART, PCO2ART, PO2ART,  in the last 168 hours Liver Enzymes  Recent Labs Lab 07/19/13 0400 07/20/13 0440 07/21/13 0720  AST 15 20 802*  ALT 9 11 338*  ALKPHOS 177* 152* 179*  BILITOT 0.3 0.3 0.6  ALBUMIN 2.0* 2.1* 2.1*   Cardiac Enzymes  Recent Labs Lab 07/18/13 1424  PROBNP 4170.0*   Glucose No results found for this basename: GLUCAP,  in the last 168 hours  Imaging Ct Angio Abd/pel W/ And/or W/o  07/21/2013   CLINICAL DATA:  Epigastric abdominal pain, nausea, vomiting and weight loss. Endoscopy on 12/07 demonstrates severe erosive ulcerative gastritis as well as duodenitis and jejunitis.  EXAM: CT ANGIOGRAPHY ABDOMEN AND PELVIS WITH CONTRAST AND WITHOUT CONTRAST  TECHNIQUE: Multidetector CT imaging of the abdomen and pelvis was performed using the standard protocol during bolus  administration of intravenous contrast. Multiplanar reconstructed images including MIPs were obtained and reviewed to  evaluate the vascular anatomy.  CONTRAST:  OMNIPAQUE IOHEXOL 350 MG/ML SOLN  COMPARISON:  Standard CT of the abdomen and pelvis with contrast on 07/18/2013.  FINDINGS: The aorta and major visceral branches are heavily calcified with diffuse atherosclerosis present. The origin of the celiac axis shows critical narrowing and likely near subtotal occlusive disease. Distal branches are opacified and the celiac trunk is likely not completely occluded.  The proximal superior mesenteric artery shows heavily calcified plaque extending for a long distance down the trunk of the SMA. The proximal SMA trunk is completely occluded with reconstitution more distally by jejunal branches related to collateral reconstitution from celiac and IMA supplied. The inferior mesenteric artery origin is heavily calcified but open.  Bilateral renal artery stents are identified which are open. Bilateral common iliac artery stents are also present which are open. The external iliac arteries are diffusely diseased with heavily calcified plaque.  There is likely critical stenosis and near subtotal occlusion at the level of the mid right external iliac artery. Moderate narrowing of the distal external iliac artery is also present just above the inguinal ligament approaching 70-75% narrowing. At the level of the right groin, postsurgical changes are seen likely related to prior femoral bypass with an occluded proximal bypass graft present. The native SFA is also occluded at its origin.  Diffuse disease of the left external iliac artery present without significant stenosis. The common femoral artery shows irregular plaque causing approximately 50% narrowing just below the inguinal ligament. Postsurgical changes are seen in the left groin with an open proximal bypass graft identified in the anterior thigh. The native SFA is  occluded at its origin.  Nonvascular evaluation shows significant interval distension of small bowel loops which are diffusely dilated and fluid-filled. There may be some pneumatosis in the transverse duodenum and proximal jejunum as well, concerning for potential evolution of bowel ischemia to necrosis. The colon is completely decompressed. There is no evidence of free intraperitoneal air or focal abscess. The gallbladder shows some increased distention since the prior study. There is no evidence of overt biliary obstruction, however. Stable left adrenal mass identified. This is fairly low density and likely an incidental adenoma.  Review of the MIP images confirms the above findings.  IMPRESSION: 1. 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 2. 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 3. Heavily calcified but open inferior mesenteric artery. 4. 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 Postsurgical changes are evident in the right groin with occlusion of a previously placed proximal bypass graft. 5. Open left femoral bypass graft. 6. Significant interval distension of small bowel loops which are diffusely dilated and filled with fluid. There also may be a pneumatosis in the duodenum and proximal jejunum concerning for severe ischemia/necrosis. Surgical consultation would be recommended. Critical Value/emergent results were called by telephone at the time of interpretation on 07/21/2013 at 8:00 AM to Dr.Prashant Thedore Mins, who verbally acknowledged these results.   Electronically Signed   By: Irish Lack M.D.   On: 07/21/2013 08:35     CXR: see above  ASSESSMENT / PLAN:  PULMONARY A:Acute resp failure     COPD with tobacco abuse hx Uncompensated met acidosi P:   Vent bundle BD's Increase RR to compensate for   Acidosis. Follow abg Peep to 8 , goal to 50% fio2 pcxr now and in  am   CARDIOVASCULAR A: Shock presumed sepsis, SIRS response      CAD      Vasculopath  P:  Sepsis protocol 12 lead Trop x 1 Vascular surgery following Levophed to map goal, max at 25 mics Lactic acid cvp to 12 as goal Cortisol tsh Avoid vaso as higher risk mesenteric ischemia  RENAL A:  Acidosis, hyperkalemia, mild ARF, h/o ras P:   Vent rate increased Follow up ABG Bicarb/Ca++ lacutc acid repeat chem stat Avoid acei  GASTROINTESTINAL A:  SBO/Ischemia P:   CCS following ? Surgical intervention No vasopressin  lft repeat Amy, lip, ldh  HEMATOLOGIC A: DVT prevention P: may have an issue with scd, follow pulses closely , especially rt lower coags   INFECTIOUS A:  R/o bactereial transloaction, at risk perf P:   Dc cefepime, add zosyn Continue vanc Add diflucan BC  ENDOCRINE A:  Hypoglycemia, high risk rel AI P:   SSI, may need to dc Add stress roids after cortisol sent  NEUROLOGIC A:  Lethargy most likely secondary to metabolic state P:   Daily WUA fent  TODAY'S SUMMARY:  ETT, shock, may not survive, may go for ex lap if stablize DNR established, await cvp   Brett Canales Minor ACNP [facility] P[facility] Pager [phone] till 3 pm If no answer page [phone] 07/21/2013, 9:17 AM  I have fully examined this patient and agree with above findings.    And edited infull  Ccm time independent 85 min   Mcarthur Rossetti. Tyson Alias, MD, FACP Pgr: [phone] [place] [facility]

## 2013-07-21 NOTE — Progress Notes (Signed)
Patient appears very pale. Patient is oriented x3 but very lethargic. "I want to talk to my doctor. I feel like I am dying." BP 85/54 HR 97 O2 sat 77% on room air. Dr. Thedore Mins notified and at bedside. Non-rebreather applied. NS bolus given. Rapid response to bedside as well as GI surgeons. Family present and notified of situation. Mamie Levers

## 2013-07-21 NOTE — Consult Note (Signed)
General Surgery [facility]  Patient seen and evaluated with critical care team and vascular surgery at the bedside on the floor and in the ICU this morning.  Agree with note attached by Barnetta Chapel.  Discussed findings and plan of care with family.  Patient now somewhat more stable after resuscitation by CCM.  Will proceed with exploratory laparotomy today if family agrees.  May require bowel resection, possible colostomy, possible open abdomen for management.  May find extensive ischemia / infarction which is not amenable to surgical resection and will represent a terminal event.  Family aware.  Correcting INR with plasma and Vit K.  Will ask anesthesia to evaluate.  Velora Heckler, MD, [facility] Office: [phone]

## 2013-07-21 NOTE — Significant Event (Signed)
Rapid Response Event Note  Overview: Time Called: 0841 Arrival Time: 1610 Event Type: Hypotension;Neurologic  Initial Focused Assessment: Patient with BP 65/30, decrease LOC, pt gray.  MD at bedside.  Family at bedside.     Interventions: 1L NS bolus via PIV Dr Brooks Sailors and Brett Canales NP at bedside 100 mcg Fentanly, 4mg  Versed and 20 Etomidate given IVP Patient intubated OG tube placed Right Femoral line placed 1L NS bolus via central line Patient transported to 2S11 via bed with O2 via Ambu bag, and zoll heart monitor  Event Summary: Name of Physician Notified: Dr Thedore Mins MD, and Barnetta Chapel  PA at bedside at    Name of Consulting Physician Notified: Dr Tyson Alias at [phone]  Outcome: Transferred (Comment) (2S11)  Event End Time: 5409  Marcellina Millin

## 2013-07-21 NOTE — Preoperative (Addendum)
Beta Blockers   Reason not to administer Beta Blockers:Hold beta blocker due to hypotension, Hold beta blocker due to hypovolemia, Concurrent use of intravenous inotropic medications during the perioperative

## 2013-07-21 NOTE — Procedures (Signed)
Central Venous Catheter Insertion Procedure Note QUALYN OYERVIDES [id number] 1935/06/22  Procedure: Insertion of Central Venous Catheter Indications: Assessment of intravascular volume, Drug and/or fluid administration and Frequent blood sampling  Procedure Details Consent: Risks of procedure as well as the alternatives and risks of each were explained to the (patient/caregiver).  Consent for procedure obtained. Time Out: Verified patient identification, verified procedure, site/side was marked, verified correct patient position, special equipment/implants available, medications/allergies/relevent history reviewed, required imaging and test results available.  Performed  Maximum sterile technique was used including antiseptics, cap, gloves, gown, hand hygiene, mask and sheet. Skin prep: Chlorhexidine; local anesthetic administered A antimicrobial bonded/coated triple lumen catheter was placed in the right femoral vein due to emergent situation using the Seldinger technique. Ultrasound guidance used.yes Catheter placed to 20 cm. Blood aspirated via all 3 ports and then flushed x 3. Line sutured x 2 and dressing applied.  Evaluation Blood flow good Complications: No apparent complications Patient did tolerate procedure well. Chest X-ray ordered to verify placement.  CXR: pending. Performed per Dr. Tyson Alias urgently prior to impending code.  emregent sterile  Mcarthur Rossetti. Tyson Alias, MD, FACP Pgr: [phone] [place] [facility]

## 2013-07-21 NOTE — Progress Notes (Signed)
General Surgery [facility]  Agree with attached.  Plan to proceed with laparotomy urgently.  Vascular surgery (Dr. Arbie Cookey) to be present in OR and assist.  Velora Heckler, MD, [facility] Office: [phone]

## 2013-07-21 NOTE — Progress Notes (Signed)
I have had extensive discussions with family , including wife. We discussed patients current circumstances and organ failures. We also discussed patient's prior wishes under circumstances such as this. Family has decided to NOT perform resuscitation if arrest but to continue current medical support for now. Mcarthur Rossetti. Tyson Alias, MD, FACP Pgr: [phone] [place] [facility]

## 2013-07-21 NOTE — Progress Notes (Signed)
[facility]                                                                                Patient Demographics  [name], is a 78 y.o. male, DOB - [birth year], ZOX:[id number]  Admit date - 07/18/2013   Admitting Physician Therisa Doyne, MD  Outpatient Primary MD for the patient is [name], MD  LOS - 3   Chief Complaint  Patient presents with  . Emesis        Assessment & Plan    1. Nausea and vomiting in adult with evidence of duodenitis on CT scan causing likely due to partial duodenal obstruction on CT scan ( recent Endoscopy 11-26: Three medium sized gastric antral ulcers. Single, large, gastric fundus ulcer , Angiodysplastic lesion in the gastric antrum & Duodenitis) . He had recently been admitted twice in the last 1 month once for atrial fibrillation and norvirus antritis and one week later for antritis and peptic ulcer disease.  Initial CT scan suggested mechanical duodenal obstruction versus duodenal inflammation. Was treated conservatively with bowel rest, PPI, GI was consulted. Underwent EGD on 07/20/2013 with changes were suggestive of severe erosive ulcerative gastritis, duodenitis and jujinitis - subsequently CT angiogram was ordered on 07/20/2013, I was called by interventional radiologist Dr. Fredia Sorrow on 07/21/2013 8:30 AM suggesting that patient had diffuse calcifications of his mesenteric circulation in that his CT scan look worse for small bowel inflammation/ was suspiciou necrosis now. Patient in the meantime was also clinically looking worse, he had developed tachycardia last night and last night his abdominal pain had worsened. After evaluating patient this morning general surgery and vascular surgery were consulted immediately. Patient soon thereafter started to become confused and dropped his blood pressure. Pulmonary critical care was consulted. He was intubated, central line was placed. Extensive discussion was made with the family and family was clearly  told that his prognosis was extremely poor in the light of possible diffuse gut ischemia with systemic insult. General surgery and vascular surgery also discussed the same with the family. Patient for now will be transferred to pulmonary critical care for initial resuscitation. He will possibly go to the OR for laparotomy and bowel evaluation. Prognosis is poor. Maxipime and Flagyl for now. Blood cultures obtained.      2. Suspicion of bibasilar infiltrate and H. Pneumonia on CT angiogram - could have aspirated during his initial nausea vomiting prior to admission, on Vanco Maxipime will add Flagyl which will help with #1 above.      3. Dehydration with mild hypokalemia secondary to nausea and vomiting. Supportive care as above, he is feeling better, pain has improved and so as his nausea, IV fluids.  Potassium is slightly on the higher side today. Remove potassium supplementation from IV fluids and monitor closely.       3. Hypertension continue coreg if BP allows.     4. History of atrial fibrillation and Coronary artery disease - able on telemetry, symptom-free, continue beta blocker goal will be rate controlled, not a candidate for anticoagulation due to recent history of upper GI bleed and active gastritis and duodenitis. Antiplatelet medications on hold due to ongoing active GI issues.  Also during his recent admission few weeks ago admission family had refused Coumadin or any other form of anticoagulation per discharge summary.      5. Recent history and norvirus will put on contact precautions, check GI pathogen panel.       Code Status:  Full  Family Communication: wife  Disposition Plan: Home   Procedures CT Abd Pelvis   Consults  GI, vascular surgery, general surgery, critical care   Medications  Scheduled Meds: . amiodarone  200 mg Oral Daily  . calcium chloride  IV  1 g Intravenous Once  . cilostazol  100 mg Oral BID  . fentaNYL      . fentaNYL       . fentaNYL  50 mcg Intravenous Once  . insulin aspart  0-9 Units Subcutaneous Q4H  . metronidazole  500 mg Intravenous Q8H  . midazolam      . pantoprazole (PROTONIX) IV  40 mg Intravenous Q12H  . sodium bicarbonate      . sodium chloride  3 mL Intravenous Q12H  . vancomycin  1,000 mg Intravenous Q12H   Continuous Infusions: . sodium chloride    . fentaNYL infusion INTRAVENOUS    . norepinephrine (LEVOPHED) Adult infusion    .  sodium bicarbonate  infusion 1000 mL     PRN Meds:.fentaNYL, levalbuterol, ondansetron (ZOFRAN) IV, ondansetron (ZOFRAN) IV  DVT Prophylaxis  SCDs   Lab Results  Component Value Date   PLT 174 07/21/2013    Antibiotics   Anti-infectives   Start     Dose/Rate Route Frequency Ordered Stop   07/21/13 0945  metroNIDAZOLE (FLAGYL) IVPB 500 mg     500 mg 100 mL/hr over 60 Minutes Intravenous Every 8 hours 07/21/13 0933     07/21/13 0800  vancomycin (VANCOCIN) IVPB 1000 mg/200 mL premix     1,000 mg 200 mL/hr over 60 Minutes Intravenous Every 12 hours 07/21/13 0626     07/21/13 0615  ceFEPIme (MAXIPIME) 1 g in dextrose 5 % 50 mL IVPB  Status:  Discontinued     1 g 100 mL/hr over 30 Minutes Intravenous 3 times per day 07/21/13 4098 07/21/13 0919        Subjective:   Shakur Lembo today is worse, feels generally weak, abdominal pain today is worse, denies any chest pain. Not short of breath.  Objective:   Filed Vitals:   07/21/13 0102 07/21/13 0236 07/21/13 0430 07/21/13 0458  BP:    103/66  Pulse:  115 114 92  Temp:    97.4 F (36.3 C)  TempSrc:    Axillary  Resp:    18  Height:      Weight:    71.578 kg (157 lb 12.8 oz)  SpO2: 93% 92% 93% 94%    Wt Readings from Last 3 Encounters:  07/21/13 71.578 kg (157 lb 12.8 oz)  07/21/13 71.578 kg (157 lb 12.8 oz)  07/16/13 71.215 kg (157 lb)     Intake/Output Summary (Last 24 hours) at 07/21/13 0934 Last data filed at 07/21/13 0700  Gross per 24 hour  Intake    750 ml  Output    750 ml  Net       0 ml    Exam Awake, appears pale and tired and, No new F.N deficits,   [place].AT,PERRAL Supple Neck,No JVD, No cervical lymphadenopathy appriciated.  Symmetrical Chest wall movement, Good air movement bilaterally, CTAB RRR,No Gallops,Rubs or new Murmurs, No Parasternal Heave +ve B.Sounds, Abd Soft, ++  preiumblical tenderness, No organomegaly appreciated . No Cyanosis, Clubbing or edema, No new Rash or bruise      Data Review   Micro Results No results found for this or any previous visit (from the past 240 hour(s)).  Radiology Reports   Ct Abdomen Pelvis W Contrast  07/18/2013   CLINICAL DATA:  Vomiting. Recently discharged after being admitted for peptic ulcers.  EXAM: CT ABDOMEN AND PELVIS WITH CONTRAST  TECHNIQUE: Multidetector CT imaging of the abdomen and pelvis was performed using the standard protocol following bolus administration of intravenous contrast.  CONTRAST:  OMNIPAQUE IOHEXOL 300 MG/ML  SOLN  COMPARISON:  06/17/2013.  FINDINGS: Multiple sigmoid colon diverticula are again demonstrated without evidence of diverticulitis. Multiple normal caliber fluid-filled loops of small bowel. Diffuse low density wall thickening in the duodenal bulb, measuring 1.2 cm in thickness on image number 26. This is producing moderate luminal narrowing. Tiny gallstones in the gallbladder. The largest measures 4 mm in maximum diameter. No gallbladder wall thickening or pericholecystic fluid.  Inhomogeneous enhancement of the liver and spleen with no discrete liver mass seen. 2.6 x 2.1 cm left adrenal mass with low density components. This was previously shown to measure 5 Hounsfield units without intravenous contrast, compatible with a benign adenoma. Small left renal cysts. Unremarkable right kidney, urinary bladder and prostate gland. No enlarged lymph nodes. No evidence of appendicitis. Dense atheromatous arterial calcifications. Bilateral renal artery stents. The no significant change in mild  bibasilar atelectasis/scarring. Mild lumbar lower thoracic spine degenerative changes.  IMPRESSION: 1. Do tonight is involving the duodenal bulb with moderate luminal narrowing. 2. Normal caliber fluid-filled small bowel loops. This can be seen with gastroenteritis. 3. Cholelithiasis without evidence of cholecystitis. 4. Extensive arterial calcifications with bilateral renal artery stents. 5. Sigmoid diverticulosis. 6. Stable left adrenal adenoma.   Electronically Signed   By: Gordan Payment M.D.   On: 07/18/2013 18:27      Dg Chest Port 1 View  07/18/2013   CLINICAL DATA:  Vomiting.  Smoker.  EXAM: PORTABLE CHEST - 1 VIEW  COMPARISON:  07/06/2013.  FINDINGS: Normal sized heart. Post CABG changes. Decreased inspiration with minimal bibasilar atelectasis. Atheromatous arterial calcifications. Stable right basilar scarring. Stable prominence of the interstitial markings.  IMPRESSION: 1. Poor inspiration with minimal bibasilar atelectasis. 2. Stable changes of COPD with right basilar scarring.   Electronically Signed   By: Gordan Payment M.D.   On: 07/18/2013 14:28      CBC  Recent Labs Lab 07/18/13 1424 07/18/13 1447 07/19/13 0400 07/20/13 0440 07/21/13 0720  WBC 17.4*  --  14.5* 10.4 11.2*  HGB 10.5* 10.9* 9.4* 8.8* 10.8*  HCT 31.4* 32.0* 28.6* 26.8* 32.7*  PLT 143*  --  148* 150 174  MCV 92.1  --  92.3 93.1 91.6  MCH 30.8  --  30.3 30.6 30.3  MCHC 33.4  --  32.9 32.8 33.0  RDW 15.2  --  15.3 15.3 15.5    Chemistries   Recent Labs Lab 07/18/13 1424 07/18/13 1447 07/19/13 0400 07/20/13 0440 07/21/13 0720  NA 136 138 135 134* 134*  K 3.3* 3.4* 3.4* 3.8 5.8*  CL 99 99 98 98 100  CO2 24  --  25 21 15*  GLUCOSE 90 98 81 60* 98  BUN 20 19 21 23  24*  CREATININE 0.93 1.10 0.87 0.87 1.05  CALCIUM 8.7  --  8.6 8.5 8.9  MG  --   --  1.7  --   --  AST 18  --  15 20 802*  ALT 11  --  9 11 338*  ALKPHOS 182*  --  177* 152* 179*  BILITOT 0.4  --  0.3 0.3 0.6    ------------------------------------------------------------------------------------------------------------------ estimated creatinine clearance is 60.6 ml/min (by C-G formula based on Cr of 1.05). ------------------------------------------------------------------------------------------------------------------ No results found for this basename: HGBA1C,  in the last 72 hours ------------------------------------------------------------------------------------------------------------------ No results found for this basename: CHOL, HDL, LDLCALC, TRIG, CHOLHDL, LDLDIRECT,  in the last 72 hours ------------------------------------------------------------------------------------------------------------------ No results found for this basename: TSH, T4TOTAL, FREET3, T3FREE, THYROIDAB,  in the last 72 hours ------------------------------------------------------------------------------------------------------------------ No results found for this basename: VITAMINB12, FOLATE, FERRITIN, TIBC, IRON, RETICCTPCT,  in the last 72 hours  Coagulation profile No results found for this basename: INR, PROTIME,  in the last 168 hours  No results found for this basename: DDIMER,  in the last 72 hours  Cardiac Enzymes No results found for this basename: CK, CKMB, TROPONINI, MYOGLOBIN,  in the last 168 hours ------------------------------------------------------------------------------------------------------------------ No components found with this basename: POCBNP,      Time Spent in minutes  65   Edgar Reisz K M.D on 07/21/2013 at 9:34 AM  Between 7am to 7pm - Pager - [phone]  After 7pm go to [email or web address] - password [email or web address]  And look for the night coverage person covering for me after hours  [facility] Office  [phone]

## 2013-07-21 NOTE — Anesthesia Postprocedure Evaluation (Signed)
  Anesthesia Post-op Note  Patient: [name]  Procedure(s) Performed: Procedure(s): EXPLORATORY LAPAROTOMY POSSIBLE BOWEL RESECTION (N/A)  Patient Location: SICU  Anesthesia Type:General  Level of Consciousness: sedated and Patient remains intubated per anesthesia plan  Airway and Oxygen Therapy: Patient remains intubated per anesthesia plan and Patient placed on Ventilator (see vital sign flow sheet for setting)  Post-op Pain: none  Post-op Assessment: Post-op Vital signs reviewed, Patient's Cardiovascular Status Stable, Respiratory Function Stable, Patent Airway, No signs of Nausea or vomiting and Pain level controlled  Post-op Vital Signs: Reviewed and stable  Complications: No apparent anesthesia complications

## 2013-07-21 NOTE — Significant Event (Signed)
Hb 6.8 after surgery.  Blood pressure acceptable, and weaning off low dose of pressor agent.  Will repeat Hb in 2 hours.  Coralyn Helling, MD 07/21/2013, 5:03 PM

## 2013-07-21 NOTE — Consult Note (Signed)
Vascular Consult Note    Patient name: [name] MRN: [id number] DOB: Aug 14, 1935 Sex: male     Reason for referral: Mesenteric ischemia  HISTORY OF PRESENT ILLNESS: Asked to see this critically ill gentleman for concern regarding mesenteric ischemia and possible intra-abdominal catastrophe. His chart was reviewed his films were reviewed and I did examine the patient discussed this at length with the patient's family this morning. He had presented with abdominal discomfort and had a CT scan with suggestion of possible mesenteric ischemia. Repeat CT scan at 3 AM this morning revealed pneumatosis in his duodenum and proximal jejunum was continued distention of the small bowel. He also underwent urgent mesenteric arteriogram this morning and I reviewed these films as well. The patient is just had acute worsening with respiratory collapse and also hypotension. He has been intubated in the unit 2 W. and is being transferred to the intensive care unit. He is unresponsive currently but apparently had severe abdominal pain prior to this. He does have extensive past surgical history with coronary artery bypass grafting, bilateral carotid endarterectomies. Multiple infra inguinal bypasses. Also has a prior renal stent.  Past Medical History  Diagnosis Date  . CEREBROVASCULAR ACCIDENT, HX OF 03/02/2007  . CHEST PAIN 01/31/2010  . CHRONIC OBSTRUCTIVE PULMONARY DISEASE, ACUTE EXACERBATION 02/18/2009  . COPD 03/02/2007  . CORONARY ARTERY DISEASE 03/02/2007  . DEPRESSION 09/23/2007    anxiety  . DISEASE, PERIPHERAL VASCULAR NEC 03/02/2007  . DIVERTICULOSIS, COLON 09/23/2007  . FATIGUE 01/22/2009  . GERD 03/02/2007  . GLUCOSE INTOLERANCE 09/23/2007  . HEMORRHOIDS 10/09/2007  . HIATAL HERNIA 10/09/2007  . HYPERCHOLESTEROLEMIA 10/09/2007  . HYPERTENSION 09/23/2007  . HYPOKALEMIA 10/09/2007  . MESENTERIC VASCULAR INSUFFICIENCY 07/06/2008  . MITRAL REGURGITATION 11/27/2008  . MI 10/09/2007  . PERIPHERAL VASCULAR  DISEASE 09/23/2007  . PUD 10/09/2007  . RENAL ARTERY STENOSIS 03/02/2007  . TOBACCO ABUSE 04/20/2010  . 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 URINARY RETENTION 01/22/2009  . VITAMIN B12 DEFICIENCY 01/03/2010  . WRIST PAIN, RIGHT 06/06/2010  . Impaired glucose tolerance 01/29/2011  . Hearing loss in left ear 01/30/2011  . Blindness of both eyes 01/30/2011  . Hyperlipidemia   . Anemia, iron deficiency   . Atrial flutter   . HTN (hypertension) 03/21/2011  . AVM (arteriovenous malformation) 09/2010    stomach and duodenum  . PUD (peptic ulcer disease) 09/2010    pyloric ulcer    Past Surgical History  Procedure Laterality Date  . Coronary artery bypass graft  1995    LIMA-LAD, SVG-OM, SVG-D, SVG-PDA  . Renal artery stenting  2004    bilateral  . S/p multiple le vascular bypass      includine fem-pop x 2 LLE  . S/p bilateral cea    . Coronary angioplasty with stent placement  2010    DES x 2 SVG-OM, cutting balloon PTCA SVG-PDA for ISR  . Appendectomy    . S/p lumbar disc surgury      x2  . S/p rle fem bypass feb 2011    . Cardiac catheterization    . Pr vein bypass graft,aorto-fem-pop    . Carotid endarterectomy    . Coronary angioplasty with stent placement  2011    DES to SVG-DIAG, SVG-PDA  . Esophagogastroduodenoscopy    . Colonoscopy    . Esophagogastroduodenoscopy N/A 07/09/2013    Procedure: ESOPHAGOGASTRODUODENOSCOPY (EGD);  Surgeon: Meryl Dare, MD;  Location: [facility] ENDOSCOPY;  Service: Endoscopy;  Laterality:  N/A;  . Esophagogastroduodenoscopy N/A 07/20/2013    Procedure: ESOPHAGOGASTRODUODENOSCOPY (EGD);  Surgeon: Hart Carwin, MD;  Location: [facility] ENDOSCOPY;  Service: Endoscopy;  Laterality: N/A;    History   Social History  . Marital Status: Married    Spouse Name: N/A    Number of Children: 2  . Years of Education: N/A   Occupational History  . 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 Smoking status: Current  Every Day Smoker -- 1.00 packs/day for 60 years    Types: Cigarettes  . Smokeless tobacco: Never Used     Comment: 1 ppd  . Alcohol Use: No  . Drug Use: No  . Sexual Activity: Not on file   Other Topics Concern  . Not on file   Social History Narrative   Daily caffeine use 1/2 decaf 1/2 caffeine coffee a day    Family History  Problem Relation Age of Onset  . Colon cancer Mother 23  . Heart disease Father   . Cancer Father   . Heart disease Sister   . Prostate cancer Brother     prostate cancer  . Stroke Sister     No Known Allergies  Prior to Admission medications   Medication Sig Start Date End Date Taking? Authorizing Provider  acetaminophen (TYLENOL) 500 MG tablet Take 500 mg by mouth every 6 (six) hours as needed for moderate pain.    Yes Historical Provider, MD  ALPRAZolam (XANAX) 0.25 MG tablet Take 1 tablet (0.25 mg total) by mouth 2 (two) times daily as needed for anxiety. 07/16/13  Yes Corwin Levins, MD  amiodarone (PACERONE) 400 MG tablet Take 200 mg by mouth daily.   Yes Historical Provider, MD  atorvastatin (LIPITOR) 40 MG tablet Take 40 mg by mouth daily.   Yes Historical Provider, MD  carvedilol (COREG) 3.125 MG tablet Take 1 tablet (3.125 mg total) by mouth 2 (two) times daily with a meal. 06/28/13  Yes Marinda Elk, MD  cilostazol (PLETAL) 100 MG tablet Take 100 mg by mouth 2 (two) times daily.   Yes Historical Provider, MD  ferrous sulfate 325 (65 FE) MG tablet Take 325 mg by mouth 2 (two) times daily with a meal.    Yes Historical Provider, MD  furosemide (LASIX) 20 MG tablet Take 1 tablet (20 mg total) by mouth daily. 07/12/13  Yes Belkys A Regalado, MD  nitroGLYCERIN (NITROSTAT) 0.4 MG SL tablet Place 1 tablet (0.4 mg total) under the tongue every 5 (five) minutes as needed for chest pain. 12/17/12  Yes Rhetta Mura, MD  pantoprazole (PROTONIX) 40 MG tablet Take 1 tablet (40 mg total) by mouth 2 (two) times daily before a meal. 07/12/13  Yes  Belkys A Regalado, MD  potassium chloride 20 MEQ TBCR Take 20 mEq by mouth daily. 07/12/13  Yes Belkys A Regalado, MD  promethazine (PHENERGAN) 25 MG tablet Take 1 tablet (25 mg total) by mouth every 8 (eight) hours as needed for nausea or vomiting. 07/16/13  Yes Corwin Levins, MD  ranitidine (ZANTAC) 150 MG tablet Take 1 tablet (150 mg total) by mouth at bedtime. 01/03/13  Yes Corwin Levins, MD  sertraline (ZOLOFT) 100 MG tablet Take 1 tablet (100 mg total) by mouth daily. 07/16/13  Yes Corwin Levins, MD  feeding supplement, RESOURCE BREEZE, (RESOURCE BREEZE) LIQD Take 1 Container by mouth 2 (two) times daily between meals. 07/12/13   Belkys A  Regalado, MD     REVIEW OF SYSTEMS: Reviewed in his history and physical with nothing to add  PHYSICAL EXAMINATION:  Filed Vitals:   07/21/13 0458  BP: 103/66  Pulse: 116  Temp: 97.4 F (36.3 C)  Resp: 18    General: The patient appears their stated age. Pulmonary: Just intubated being bagged Abdomen: Distended. Musculoskeletal: There are no major deformities.   Neurologic: Unresponsive, just intubated Skin: There are no ulcer or rashes noted. He does have incisions in both groins with diminished pulses currently. He has mottling of his entire right leg. Cardiovascular: There is a regular rate and rhythm without significant murmur appreciated.  Diagnostic Studies: CT angiogram reveals extensive calcification of his entire aorta celiac artery and superior mesenteric artery. The calcification extends out into the distal branches of his mesentery.    Medication Changes: None  Assessment:  Critical ill patient with probable intra-abdominal catastrophe which is probably not correctable. Discuss this at length with the family. Also discussed this with Dr.Gerkin and Tyson Alias who are present. With the CT finding suspect that he has diffuse bowel infarction. CT was extensive calcification it is very doubtful that he would be to revascularize  ablation candidate. I suspect this is a terminal event and did discuss this with the family present as well  Plan: Resuscitation and a consideration of emergent exploratory laparotomy if the patient appears to have any chance for successful outcome.  EARLY, TODD 12/8/20149:41 AM

## 2013-07-21 NOTE — Progress Notes (Signed)
ANTIBIOTIC CONSULT NOTE - INITIAL  Pharmacy Consult for vancomycin Indication: rule out pneumonia  No Known Allergies  Patient Measurements: Height: 6' (182.9 cm) Weight: 157 lb 12.8 oz (71.578 kg) IBW/kg (Calculated) : 77.6  Vital Signs: Temp: 97.4 F (36.3 C) (12/08 0458) Temp src: Axillary (12/08 0458) BP: 103/66 mmHg (12/08 0458) Pulse Rate: 116 (12/08 0458) Intake/Output from previous day: 12/07 0701 - 12/08 0700 In: 100 [P.O.:100] Out: 750 [Urine:550; Emesis/NG output:200] Intake/Output from this shift: Total I/O In: -  Out: 450 [Urine:250; Emesis/NG output:200]  Labs:  Recent Labs  07/18/13 1424 07/18/13 1447 07/19/13 0400 07/20/13 0440  WBC 17.4*  --  14.5* 10.4  HGB 10.5* 10.9* 9.4* 8.8*  PLT 143*  --  148* 150  CREATININE 0.93 1.10 0.87 0.87   Estimated Creatinine Clearance: 73.2 ml/min (by C-G formula based on Cr of 0.87).  Microbiology: Recent Results (from the past 720 hour(s))  CULTURE, EXPECTORATED SPUTUM-ASSESSMENT     Status: None   Collection Time    06/21/13  1:00 PM      Result Value Range Status   Specimen Description SPUTUM   Final   Special Requests Normal   Final   Sputum evaluation     Final   Value: THIS SPECIMEN IS ACCEPTABLE. RESPIRATORY CULTURE REPORT TO FOLLOW.   Report Status 06/21/2013 FINAL   Final  CULTURE, RESPIRATORY (NON-EXPECTORATED)     Status: None   Collection Time    06/21/13  1:00 PM      Result Value Range Status   Specimen Description SPUTUM   Final   Special Requests NONE   Final   Gram Stain     Final   Value: NO WBC SEEN     NO SQUAMOUS EPITHELIAL CELLS SEEN     FEW GRAM POSITIVE COCCI     IN PAIRS     Performed at [facility]   Culture     Final   Value: NORMAL OROPHARYNGEAL FLORA     Performed at [facility]   Report Status 06/24/2013 FINAL   Final  MRSA PCR SCREENING     Status: None   Collection Time    07/06/13 10:25 PM      Result Value Range Status   MRSA by PCR NEGATIVE   NEGATIVE Final   Comment:            The GeneXpert MRSA Assay (FDA     approved for NASAL specimens     only), is one component of a     comprehensive MRSA colonization     surveillance program. It is not     intended to diagnose MRSA     infection nor to guide or     monitor treatment for     MRSA infections.  CLOSTRIDIUM DIFFICILE BY PCR     Status: None   Collection Time    07/08/13  4:55 PM      Result Value Range Status   C difficile by pcr NEGATIVE  NEGATIVE Final  STOOL CULTURE     Status: None   Collection Time    07/08/13  4:55 PM      Result Value Range Status   Specimen Description STOOL   Final   Special Requests Normal   Final   Culture     Final   Value: NO SALMONELLA, SHIGELLA, CAMPYLOBACTER, YERSINIA, OR E.COLI 0157:H7 ISOLATED     Performed at [facility]   Report Status  07/12/2013 FINAL   Final    Medical History: Past Medical History  Diagnosis Date  . CEREBROVASCULAR ACCIDENT, HX OF 03/02/2007  . CHEST PAIN 01/31/2010  . CHRONIC OBSTRUCTIVE PULMONARY DISEASE, ACUTE EXACERBATION 02/18/2009  . COPD 03/02/2007  . CORONARY ARTERY DISEASE 03/02/2007  . DEPRESSION 09/23/2007    anxiety  . DISEASE, PERIPHERAL VASCULAR NEC 03/02/2007  . DIVERTICULOSIS, COLON 09/23/2007  . FATIGUE 01/22/2009  . GERD 03/02/2007  . GLUCOSE INTOLERANCE 09/23/2007  . HEMORRHOIDS 10/09/2007  . HIATAL HERNIA 10/09/2007  . HYPERCHOLESTEROLEMIA 10/09/2007  . HYPERTENSION 09/23/2007  . HYPOKALEMIA 10/09/2007  . MESENTERIC VASCULAR INSUFFICIENCY 07/06/2008  . MITRAL REGURGITATION 11/27/2008  . MI 10/09/2007  . PERIPHERAL VASCULAR DISEASE 09/23/2007  . PUD 10/09/2007  . RENAL ARTERY STENOSIS 03/02/2007  . TOBACCO ABUSE 04/20/2010  . 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 URINARY RETENTION 01/22/2009  . VITAMIN B12 DEFICIENCY 01/03/2010  . WRIST PAIN, RIGHT 06/06/2010  . Impaired glucose tolerance 01/29/2011  . Hearing loss in left ear 01/30/2011  . Blindness of both eyes 01/30/2011  .  Hyperlipidemia   . Anemia, iron deficiency   . Atrial flutter   . HTN (hypertension) 03/21/2011  . AVM (arteriovenous malformation) 09/2010    stomach and duodenum  . PUD (peptic ulcer disease) 09/2010    pyloric ulcer    Medications:  Prescriptions prior to admission  Medication Sig Dispense Refill  . acetaminophen (TYLENOL) 500 MG tablet Take 500 mg by mouth every 6 (six) hours as needed for moderate pain.       Marland Kitchen ALPRAZolam (XANAX) 0.25 MG tablet Take 1 tablet (0.25 mg total) by mouth 2 (two) times daily as needed for anxiety.  60 tablet  0  . amiodarone (PACERONE) 400 MG tablet Take 200 mg by mouth daily.      Marland Kitchen atorvastatin (LIPITOR) 40 MG tablet Take 40 mg by mouth daily.      . carvedilol (COREG) 3.125 MG tablet Take 1 tablet (3.125 mg total) by mouth 2 (two) times daily with a meal.      . cilostazol (PLETAL) 100 MG tablet Take 100 mg by mouth 2 (two) times daily.      . ferrous sulfate 325 (65 FE) MG tablet Take 325 mg by mouth 2 (two) times daily with a meal.       . furosemide (LASIX) 20 MG tablet Take 1 tablet (20 mg total) by mouth daily.  30 tablet  0  . nitroGLYCERIN (NITROSTAT) 0.4 MG SL tablet Place 1 tablet (0.4 mg total) under the tongue every 5 (five) minutes as needed for chest pain.  30 tablet  0  . pantoprazole (PROTONIX) 40 MG tablet Take 1 tablet (40 mg total) by mouth 2 (two) times daily before a meal.  60 tablet  0  . potassium chloride 20 MEQ TBCR Take 20 mEq by mouth daily.  20 tablet  0  . promethazine (PHENERGAN) 25 MG tablet Take 1 tablet (25 mg total) by mouth every 8 (eight) hours as needed for nausea or vomiting.  30 tablet  2  . ranitidine (ZANTAC) 150 MG tablet Take 1 tablet (150 mg total) by mouth at bedtime.  90 tablet  3  . sertraline (ZOLOFT) 100 MG tablet Take 1 tablet (100 mg total) by mouth daily.  90 tablet  3  . feeding supplement, RESOURCE BREEZE, (RESOURCE BREEZE) LIQD Take 1 Container by mouth 2 (two) times daily between meals.  30 Container  0  Scheduled:  . amiodarone  200 mg Oral Daily  . carvedilol  3.125 mg Oral BID WC  . ceFEPime (MAXIPIME) IV  1 g Intravenous Q8H  . cilostazol  100 mg Oral BID  . guaiFENesin  600 mg Oral BID  . nicotine  14 mg Transdermal Daily  . pantoprazole (PROTONIX) IV  40 mg Intravenous Q12H  . sodium chloride  3 mL Intravenous Q12H   Infusions:  . 0.9 % NaCl with KCl 40 mEq / L 50 mL/hr at 07/20/13 1952    Assessment: 78yo male admitted 12/5 admitted for likely d/t partial duodenal obstruction, tonight vomited x2, to begin IV ABX.  Goal of Therapy:  Vancomycin trough level 15-20 mcg/ml  Plan:  Will begin vancomycin 1000mg  IV Q12H and monitor CBC, Cx, levels prn.  Vernard Gambles, PharmD, BCPS  07/21/2013,6:20 AM

## 2013-07-21 NOTE — Brief Op Note (Signed)
07/18/2013 - 07/21/2013  3:56 PM  PATIENT:  [name]  78 y.o. male  PRE-OPERATIVE DIAGNOSIS:  ischemic bowel   POST-OPERATIVE DIAGNOSIS:  Ischemic and infarcted small bowel   PROCEDURE:  Exploratory laparotomy with entrectomy (majority of jejunum and proximal ileum)  SURGEON:  Surgeon(s) and Role:    * Velora Heckler, MD - Primary    * Larina Earthly, MD - Assisting  ANESTHESIA:   general  EBL:  Total I/O In: 4525.2 [I.V.:3525.2; IV Piggyback:1000] Out: 1960 [Urine:360; Emesis/NG output:1000; Other:600]  BLOOD ADMINISTERED:none  DRAINS: none   LOCAL MEDICATIONS USED:  NONE  SPECIMEN:  Excision  DISPOSITION OF SPECIMEN:  PATHOLOGY  COUNTS:  YES  TOURNIQUET:  * No tourniquets in log *  DICTATION: .Other Dictation: Dictation Number [id number]  PLAN OF CARE: Admit to inpatient   PATIENT DISPOSITION:  ICU - intubated and critically ill.   Delay start of Pharmacological VTE agent (>24hrs) due to surgical blood loss or risk of bleeding: yes  Velora Heckler, MD, [facility] Office: [phone]

## 2013-07-21 NOTE — Progress Notes (Signed)
[name], male   DOB: October 01, 1934, 78 y.o.   MRN: [id number] The patient has somewhat stabilized in the ICU.  He is maintaining his BP right now on of Levophed.  Multiple lab results have returned showing significant signs of multiple system organ failure along with concern for ischemia of his small bowel.  I have outlined this in detail with the patient's wife and their kids and other family members.  I have expressed that I feel the patient has a catastrophic event in his abdomen and that he very likely will not survive no matter what we do.  I did give them the option of proceeding with surgery to examine his intestines to see if there is anything we can do to try and save him, but once again reinforced that I didn't feel this would change his outcome.  They all understood this, but felt that they had to give him a "chance."  They would like to proceed with surgical intervention.  His INR is 2.5.  I will give him 2 units of FFP and 5mg  of Vit K stat.  We will plan for emergent surgical intervention.  Marsean Elkhatib E 12:19 PM 07/21/2013

## 2013-07-21 NOTE — Transfer of Care (Signed)
Immediate Anesthesia Transfer of Care Note  Patient: [name]  Procedure(s) Performed: Procedure(s): EXPLORATORY LAPAROTOMY POSSIBLE BOWEL RESECTION (N/A)  Patient Location: SICU  Anesthesia Type:General  Level of Consciousness: sedated, unresponsive and Patient remains intubated per anesthesia plan  Airway & Oxygen Therapy: Patient remains intubated per anesthesia plan and Patient placed on Ventilator (see vital sign flow sheet for setting)  Post-op Assessment: Report given to PACU RN and Post -op Vital signs reviewed and stable  Post vital signs: Reviewed and stable  Complications: No apparent anesthesia complications

## 2013-07-21 NOTE — Progress Notes (Signed)
Rapid Response April was called earlier in the night and aware of patient cond. and came to see patient.

## 2013-07-21 NOTE — Procedures (Signed)
Intubation Procedure Note KYLAR LEONHARDT [id number] 09-24-1934  Procedure: Intubation Indications: Respiratory insufficiency  Procedure Details Consent: Risks of procedure as well as the alternatives and risks of each were explained to the (patient/caregiver).  Consent for procedure obtained. Time Out: Verified patient identification, verified procedure, site/side was marked, verified correct patient position, special equipment/implants available, medications/allergies/relevent history reviewed, required imaging and test results available.  Performed  Maximum sterile technique was used including gloves, gown, hand hygiene and mask.  MAC and 4    Evaluation Hemodynamic Status: BP stable throughout; O2 sats: stable throughout Patient's Current Condition: unstable Complications: No apparent complications Patient did tolerate procedure well. Chest X-ray ordered to verify placement.  CXR: pending.   Nelda Bucks 07/21/2013

## 2013-07-21 NOTE — Progress Notes (Signed)
[name], male   DOB: Dec 14, 1934, 78 y.o.   MRN: [id number] I assisted Dr. Gerrit Friends with the patient's small bowel resection and exploratory laparotomy. Discussed the findings with him and the patient's family following surgery. I agree that this is a very complex problem. His small bowel mesentery was completely calcified. His iliac vessels were completely calcified as well. He does have mottling of his right leg from his knee distally. Initially this was his total left leg but this has resolved and his thigh. He does have palpable femoral pulses bilaterally and has an easily palpable left femoral to anterior tibial graft pulse. We discussed with the family that he is a very high chance of mortality with the extensive bowel necrosis. His only chance for some level appears to be a removing the frankly necrotic bowel will allow him to versus multisystem organ failure. His celiac SMA and IMA were all patent but with diffuse disease in this may be worsening exacerbation duet to multisystem failure and not from new occlusive disease. Continue with supportive care

## 2013-07-21 NOTE — Progress Notes (Signed)
Daryl R.N. Into assess patient and only heard B.S. In left upper quad of abdo. Lafonda Mosses R.N. Heard no B.S. Paged T. Claiborne Billings at 01:55 and 0205 call return and he came and saw patient at 02:15. Will send patient for C.T. ASAP

## 2013-07-21 NOTE — Progress Notes (Signed)
Check V.S. Pre Phenergan B.P. 117/72 H.R. 117, O2 3 l /m N.C. 88% inc. O2 to 4  l/m N.C.Enc. CDB and O2  sats up to 93% cont. To monitor patient and V.S. R.N. aware

## 2013-07-21 NOTE — Consult Note (Signed)
CORDARRELL SANE [birth year]  [id number].   Requesting MD: Dr. Susa Raring Chief Complaint/Reason for Consult: ischemic bowel HPI: This is a 78 yo white male who upon my arrival was unstable.  CCM was emergently called to place central lines and intubate the patient.  His wife and other family member who was present provided some history.  Apparently, the patient had norovirus in October of this year.  He was in and out of the hospital a couple of times with nausea and vomiting.  He did have an endoscopy during this first admission due to a GI bleed.  He was found to have 3 antral ulcers.  His coumadin was stopped and he was kept on just ASA.  Apparently, he got readmitted for recurrent nausea and vomiting and more bleeding.  His ASA was then held.  Once again, he got readmitted this time with nausea and vomiting.  He had a CT scan on Saturday completed which revealed some thickening of the duodenum, but otherwise fluid filled normal caliber small bowel loops c/w gastroenteritis.  He also had extensive arterial calcifications with B renal artery stents.  GI saw the patient and did an upper endo yesterday and saw severe erosive and ulcerative gastritis with duodenitis and jejunitis.  Biopsies were taken to rule out ischemia.  A CTA was then ordered early this morning due to worsening pain.  This revealed extensive stenosis and obstruction of his major abdominal vessels, such as SMA, IMA, and celiac.  The patient also had distention of his small bowel with possible pneumatosis in the duodenum and proximal jejunum concerning for ischemia/necrosis.  We were then consulted for further recommendations.  ROS: Unable to obtain due to multiple procedures, including intubation being done.  Patient does admit to severe abdominal pain  Family History  Problem Relation Age of Onset  . Colon cancer Mother 62  . Heart disease Father   . Cancer Father   . Heart disease Sister   . Prostate cancer Brother     prostate  cancer  . Stroke Sister     Past Medical History  Diagnosis Date  . CEREBROVASCULAR ACCIDENT, HX OF 03/02/2007  . CHEST PAIN 01/31/2010  . CHRONIC OBSTRUCTIVE PULMONARY DISEASE, ACUTE EXACERBATION 02/18/2009  . COPD 03/02/2007  . CORONARY ARTERY DISEASE 03/02/2007  . DEPRESSION 09/23/2007    anxiety  . DISEASE, PERIPHERAL VASCULAR NEC 03/02/2007  . DIVERTICULOSIS, COLON 09/23/2007  . FATIGUE 01/22/2009  . GERD 03/02/2007  . GLUCOSE INTOLERANCE 09/23/2007  . HEMORRHOIDS 10/09/2007  . HIATAL HERNIA 10/09/2007  . HYPERCHOLESTEROLEMIA 10/09/2007  . HYPERTENSION 09/23/2007  . HYPOKALEMIA 10/09/2007  . MESENTERIC VASCULAR INSUFFICIENCY 07/06/2008  . MITRAL REGURGITATION 11/27/2008  . MI 10/09/2007  . PERIPHERAL VASCULAR DISEASE 09/23/2007  . PUD 10/09/2007  . RENAL ARTERY STENOSIS 03/02/2007  . TOBACCO ABUSE 04/20/2010  . 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 URINARY RETENTION 01/22/2009  . VITAMIN B12 DEFICIENCY 01/03/2010  . WRIST PAIN, RIGHT 06/06/2010  . Impaired glucose tolerance 01/29/2011  . Hearing loss in left ear 01/30/2011  . Blindness of both eyes 01/30/2011  . Hyperlipidemia   . Anemia, iron deficiency   . Atrial flutter   . HTN (hypertension) 03/21/2011  . AVM (arteriovenous malformation) 09/2010    stomach and duodenum  . PUD (peptic ulcer disease) 09/2010    pyloric ulcer    Past Surgical History  Procedure Laterality Date  . Coronary artery bypass graft  1995    LIMA-LAD, SVG-OM, SVG-D, SVG-PDA  .  Renal artery stenting  2004    bilateral  . S/p multiple le vascular bypass      includine fem-pop x 2 LLE  . S/p bilateral cea    . Coronary angioplasty with stent placement  2010    DES x 2 SVG-OM, cutting balloon PTCA SVG-PDA for ISR  . Appendectomy    . S/p lumbar disc surgury      x2  . S/p rle fem bypass feb 2011    . Cardiac catheterization    . Pr vein bypass graft,aorto-fem-pop    . Carotid endarterectomy    . Coronary angioplasty with stent placement  2011    DES  to SVG-DIAG, SVG-PDA  . Esophagogastroduodenoscopy    . Colonoscopy    . Esophagogastroduodenoscopy N/A 07/09/2013    Procedure: ESOPHAGOGASTRODUODENOSCOPY (EGD);  Surgeon: Meryl Dare, MD;  Location[facility] ENDOSCOPY;  Service: Endoscopy;  Laterality: N/A;  . Esophagogastroduodenoscopy N/A 07/20/2013    Procedure: ESOPHAGOGASTRODUODENOSCOPY (EGD);  Surgeon: Hart Carwin, MD;  Location: [facility] ENDOSCOPY;  Service: Endoscopy;  Laterality: N/A;    Social History:  reports that he has been smoking Cigarettes.  He has a 60 pack-year smoking history. He has never used smokeless tobacco. He reports that he does not drink alcohol or use illicit drugs.  Allergies: No Known Allergies  Medications Prior to Admission  Medication Sig Dispense Refill  . acetaminophen (TYLENOL) 500 MG tablet Take 500 mg by mouth every 6 (six) hours as needed for moderate pain.       Marland Kitchen ALPRAZolam (XANAX) 0.25 MG tablet Take 1 tablet (0.25 mg total) by mouth 2 (two) times daily as needed for anxiety.  60 tablet  0  . amiodarone (PACERONE) 400 MG tablet Take 200 mg by mouth daily.      Marland Kitchen atorvastatin (LIPITOR) 40 MG tablet Take 40 mg by mouth daily.      . carvedilol (COREG) 3.125 MG tablet Take 1 tablet (3.125 mg total) by mouth 2 (two) times daily with a meal.      . cilostazol (PLETAL) 100 MG tablet Take 100 mg by mouth 2 (two) times daily.      . ferrous sulfate 325 (65 FE) MG tablet Take 325 mg by mouth 2 (two) times daily with a meal.       . furosemide (LASIX) 20 MG tablet Take 1 tablet (20 mg total) by mouth daily.  30 tablet  0  . nitroGLYCERIN (NITROSTAT) 0.4 MG SL tablet Place 1 tablet (0.4 mg total) under the tongue every 5 (five) minutes as needed for chest pain.  30 tablet  0  . pantoprazole (PROTONIX) 40 MG tablet Take 1 tablet (40 mg total) by mouth 2 (two) times daily before a meal.  60 tablet  0  . potassium chloride 20 MEQ TBCR Take 20 mEq by mouth daily.  20 tablet  0  . promethazine (PHENERGAN) 25 MG tablet  Take 1 tablet (25 mg total) by mouth every 8 (eight) hours as needed for nausea or vomiting.  30 tablet  2  . ranitidine (ZANTAC) 150 MG tablet Take 1 tablet (150 mg total) by mouth at bedtime.  90 tablet  3  . sertraline (ZOLOFT) 100 MG tablet Take 1 tablet (100 mg total) by mouth daily.  90 tablet  3  . feeding supplement, RESOURCE BREEZE, (RESOURCE BREEZE) LIQD Take 1 Container by mouth 2 (two) times daily between meals.  30 Container  0    Blood pressure 103/66, pulse  116, temperature 97.4 F (36.3 C), temperature source Axillary, resp. rate 18, height 6' (1.829 m), weight 157 lb 12.8 oz (71.578 kg), SpO2 94.00%. Physical Exam: General: very ill-appearing white male who is laying in bed in obvious distress secondary to abdominal pain. HEENT: head is normocephalic, atraumatic.  Sclera are noninjected.  PERRL.  Ears and nose without any masses or lesions.  Heart: regular, rate, and rhythm.  Normal s1,s2. No obvious murmurs, gallops, or rubs noted.  Palpable femoral pulses, unable to palpate radial pulses at this time Lungs: diffuse rhonchi, no wheezes, or rales noted.  Respiratory effort labored and preparing for intubation Abd: soft, but very tender with peritoneal signs, some distention, absent BS, no masses, hernias, or organomegaly noted Skin: warm and dry with no masses, lesions, or rashes, but quite pale Psych: unable to fully assess due to resuscitative efforts    Results for orders placed during the hospital encounter of 07/18/13 (from the past 48 hour(s))  CBC     Status: Abnormal   Collection Time    07/20/13  4:40 AM      Result Value Range   WBC 10.4  4.0 - 10.5 K/uL   RBC 2.88 (*) 4.22 - 5.81 MIL/uL   Hemoglobin 8.8 (*) 13.0 - 17.0 g/dL   HCT 16.1 (*) 09.6 - 04.5 %   MCV 93.1  78.0 - 100.0 fL   MCH 30.6  26.0 - 34.0 pg   MCHC 32.8  30.0 - 36.0 g/dL   RDW 40.9  81.1 - 91.4 %   Platelets 150  150 - 400 K/uL  COMPREHENSIVE METABOLIC PANEL     Status: Abnormal    Collection Time    07/20/13  4:40 AM      Result Value Range   Sodium 134 (*) 135 - 145 mEq/L   Potassium 3.8  3.5 - 5.1 mEq/L   Chloride 98  96 - 112 mEq/L   CO2 21  19 - 32 mEq/L   Glucose, Bld 60 (*) 70 - 99 mg/dL   BUN 23  6 - 23 mg/dL   Creatinine, Ser 7.82  0.50 - 1.35 mg/dL   Calcium 8.5  8.4 - 95.6 mg/dL   Total Protein 5.4 (*) 6.0 - 8.3 g/dL   Albumin 2.1 (*) 3.5 - 5.2 g/dL   AST 20  0 - 37 U/L   ALT 11  0 - 53 U/L   Alkaline Phosphatase 152 (*) 39 - 117 U/L   Total Bilirubin 0.3  0.3 - 1.2 mg/dL   GFR calc non Af Amer 82 (*) >90 mL/min   GFR calc Af Amer >90  >90 mL/min   Comment: (NOTE)     The eGFR has been calculated using the CKD EPI equation.     This calculation has not been validated in all clinical situations.     eGFR's persistently <90 mL/min signify possible Chronic Kidney     Disease.  STREP PNEUMONIAE URINARY ANTIGEN     Status: Abnormal   Collection Time    07/21/13  6:50 AM      Result Value Range   Strep Pneumo Urinary Antigen POSITIVE (*) NEGATIVE  CBC     Status: Abnormal   Collection Time    07/21/13  7:20 AM      Result Value Range   WBC 11.2 (*) 4.0 - 10.5 K/uL   RBC 3.57 (*) 4.22 - 5.81 MIL/uL   Hemoglobin 10.8 (*) 13.0 - 17.0 g/dL  Comment: REPEATED TO VERIFY   HCT 32.7 (*) 39.0 - 52.0 %   MCV 91.6  78.0 - 100.0 fL   MCH 30.3  26.0 - 34.0 pg   MCHC 33.0  30.0 - 36.0 g/dL   RDW 16.1  09.6 - 04.5 %   Platelets 174  150 - 400 K/uL  COMPREHENSIVE METABOLIC PANEL     Status: Abnormal   Collection Time    07/21/13  7:20 AM      Result Value Range   Sodium 134 (*) 135 - 145 mEq/L   Potassium 5.8 (*) 3.5 - 5.1 mEq/L   Comment: NO VISIBLE HEMOLYSIS   Chloride 100  96 - 112 mEq/L   CO2 15 (*) 19 - 32 mEq/L   Glucose, Bld 98  70 - 99 mg/dL   BUN 24 (*) 6 - 23 mg/dL   Creatinine, Ser 4.09  0.50 - 1.35 mg/dL   Calcium 8.9  8.4 - 81.1 mg/dL   Total Protein 6.0  6.0 - 8.3 g/dL   Albumin 2.1 (*) 3.5 - 5.2 g/dL   AST 914 (*) 0 - 37 U/L   ALT  338 (*) 0 - 53 U/L   Alkaline Phosphatase 179 (*) 39 - 117 U/L   Total Bilirubin 0.6  0.3 - 1.2 mg/dL   GFR calc non Af Amer 67 (*) >90 mL/min   GFR calc Af Amer 78 (*) >90 mL/min   Comment: (NOTE)     The eGFR has been calculated using the CKD EPI equation.     This calculation has not been validated in all clinical situations.     eGFR's persistently <90 mL/min signify possible Chronic Kidney     Disease.   Ct Angio Abd/pel W/ And/or W/o  07/21/2013   CLINICAL DATA:  Epigastric abdominal pain, nausea, vomiting and weight loss. Endoscopy on 12/07 demonstrates severe erosive ulcerative gastritis as well as duodenitis and jejunitis.  EXAM: CT ANGIOGRAPHY ABDOMEN AND PELVIS WITH CONTRAST AND WITHOUT CONTRAST  TECHNIQUE: Multidetector CT imaging of the abdomen and pelvis was performed using the standard protocol during bolus administration of intravenous contrast. Multiplanar reconstructed images including MIPs were obtained and reviewed to evaluate the vascular anatomy.  CONTRAST:  OMNIPAQUE IOHEXOL 350 MG/ML SOLN  COMPARISON:  Standard CT of the abdomen and pelvis with contrast on 07/18/2013.  FINDINGS: The aorta and major visceral branches are heavily calcified with diffuse atherosclerosis present. The origin of the celiac axis shows critical narrowing and likely near subtotal occlusive disease. Distal branches are opacified and the celiac trunk is likely not completely occluded.  The proximal superior mesenteric artery shows heavily calcified plaque extending for a long distance down the trunk of the SMA. The proximal SMA trunk is completely occluded with reconstitution more distally by jejunal branches related to collateral reconstitution from celiac and IMA supplied. The inferior mesenteric artery origin is heavily calcified but open.  Bilateral renal artery stents are identified which are open. Bilateral common iliac artery stents are also present which are open. The external iliac arteries are  diffusely diseased with heavily calcified plaque.  There is likely critical stenosis and near subtotal occlusion at the level of the mid right external iliac artery. Moderate narrowing of the distal external iliac artery is also present just above the inguinal ligament approaching 70-75% narrowing. At the level of the right groin, postsurgical changes are seen likely related to prior femoral bypass with an occluded proximal bypass graft present. The native SFA is  also occluded at its origin.  Diffuse disease of the left external iliac artery present without significant stenosis. The common femoral artery shows irregular plaque causing approximately 50% narrowing just below the inguinal ligament. Postsurgical changes are seen in the left groin with an open proximal bypass graft identified in the anterior thigh. The native SFA is occluded at its origin.  Nonvascular evaluation shows significant interval distension of small bowel loops which are diffusely dilated and fluid-filled. There may be some pneumatosis in the transverse duodenum and proximal jejunum as well, concerning for potential evolution of bowel ischemia to necrosis. The colon is completely decompressed. There is no evidence of free intraperitoneal air or focal abscess. The gallbladder shows some increased distention since the prior study. There is no evidence of overt biliary obstruction, however. Stable left adrenal mass identified. This is fairly low density and likely an incidental adenoma.  Review of the MIP images confirms the above findings.  IMPRESSION: 1. 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 2. 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 3. Heavily calcified but open inferior mesenteric artery. 4. 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 Postsurgical  changes are evident in the right groin with occlusion of a previously placed proximal bypass graft. 5. Open left femoral bypass graft. 6. Significant interval distension of small bowel loops which are diffusely dilated and filled with fluid. There also may be a pneumatosis in the duodenum and proximal jejunum concerning for severe ischemia/necrosis. Surgical consultation would be recommended. Critical Value/emergent results were called by telephone at the time of interpretation on 07/21/2013 at 8:00 AM to Dr.Prashant Thedore Mins, who verbally acknowledged these results.   Electronically Signed   By: Irish Lack M.D.   On: 07/21/2013 08:35       Assessment/Plan 1. Septic shock, secondary to small bowel ischemia 2. Presumed Small bowel ischemia, secondary to stenosed and occluded intra-abdominal vascular 3. Shock liver 4. VDRF 5. Hyperkalemia Patient Active Problem List   Diagnosis Date Noted  . Acute respiratory failure 07/21/2013  . Hypovolemic shock 07/21/2013  . Gastroparesis 07/20/2013  . Other specified gastritis without mention of hemorrhage 07/20/2013  . Protein-calorie malnutrition, severe 07/19/2013  . Nausea and vomiting in adult 07/18/2013  . Hypokalemia 07/18/2013  . Partial gastric outlet obstruction 07/18/2013  . Gastric ulcer 07/09/2013  . Abdominal pain, epigastric 07/08/2013  . Nausea with vomiting 07/08/2013  . Calculus of gallbladder without mention of cholecystitis or obstruction 07/08/2013  . Dehydration 07/06/2013  . Right bundle branch block 06/28/2013  . Acute on chronic systolic and diastolic heart failure, NYHA class 3 06/24/2013  . Enteritis due to Norovirus 06/21/2013  . Ventricular tachycardia, paroxysmal 06/21/2013  . Systolic CHF 06/21/2013  . Cardiac arrest 06/19/2013  . Acute respiratory failure with hypoxia 06/19/2013  . Atrial fibrillation with RVR 06/19/2013  . Acute renal failure 06/17/2013  . Metabolic acidosis 06/17/2013  . Hypotension 06/17/2013   . Transaminitis 06/17/2013  . Hallucinations 06/10/2013  . S/P CABG (coronary artery bypass graft) 12/24/2012  . Neck pain 12/20/2012  . Peripheral vascular disease, unspecified 05/29/2012  . Chest pain 05/14/2012  . PVD (peripheral vascular disease) with claudication 11/08/2011  . Aftercare following surgery of the circulatory system, NEC 05/10/2011  . Peripheral neuropathy 03/21/2011  . HTN (hypertension) 03/21/2011  . Hearing loss in left ear 01/30/2011  . Eustachian tube dysfunction 01/30/2011  .  Allergic rhinitis, cause unspecified 01/30/2011  . Encounter for long-term (current) use of high-risk medication 01/30/2011  . Bladder neck obstruction 01/30/2011  . Decreased vision in both eyes 01/30/2011  . Impaired glucose tolerance 01/29/2011  . Preventative health care 01/29/2011  . Other specified forms of hearing loss 08/23/2010  . WRIST PAIN, RIGHT 06/06/2010  . TOBACCO ABUSE 04/20/2010  . 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 VITAMIN B12 DEFICIENCY 01/03/2010  . MITRAL REGURGITATION 11/27/2008  . MESENTERIC VASCULAR INSUFFICIENCY 07/06/2008  . HYPERCHOLESTEROLEMIA 10/09/2007  . MI 10/09/2007  . HEMORRHOIDS 10/09/2007  . PUD 10/09/2007  . HIATAL HERNIA 10/09/2007  . Unspecified Iron Deficiency Anemia 09/23/2007  . ANXIETY 09/23/2007  . DEPRESSION 09/23/2007  . DIVERTICULOSIS, COLON 09/23/2007  . CORONARY ARTERY DISEASE 03/02/2007  . RENAL ARTERY STENOSIS 03/02/2007  . DISEASE, PERIPHERAL VASCULAR NEC 03/02/2007  . COPD 03/02/2007  . GERD 03/02/2007  . CEREBROVASCULAR ACCIDENT, HX OF 03/02/2007   Plan: 1. This patient has multiple pre-existing medical conditions.  Unfortunately, he has significant vascular diease and now appears to likely have small bowel ischemia secondary to this.  The patient is critically ill and in multiple system organ failure.  He has been made a DNR, but would otherwise like everything done except CPR.  If the patient can be  resuscitated and felt to be stable enough to go to the OR, we along side of vascular, will attempt laparotomy.  It has been discussed extensively with the family, that this is likely a catastrophic event and he will not likely survive.  We will await resuscitative efforts to see if the patient stabilizes enough to go to the OR and then see how to proceed.  Of note, we will check an INR.  The patient appears to be in shock liver.  If his INR is elevated and he does stabilize out for OR, if his INR is elevated he may need some FFP.  Will follow closely.   Bayan Hedstrom E 07/21/2013, 9:03 AM Pager: [phone]

## 2013-07-21 NOTE — Progress Notes (Signed)
Daily Rounding Note  07/21/2013, 8:42 AM  LOS: 3 days   SUBJECTIVE:       Code in progress for vascular collapse, sats in 70s on NRB mask.  Atrial fib this AM.   OBJECTIVE:         Vital signs in last 24 hours:    Temp:  [97.4 F (36.3 C)-98.1 F (36.7 C)] 97.4 F (36.3 C) (12/08 0458) Pulse Rate:  [91-117] 116 (12/08 0458) Resp:  [18-40] 18 (12/08 0458) BP: (103-146)/(52-75) 103/66 mmHg (12/08 0458) SpO2:  [87 %-97 %] 94 % (12/08 0458) Weight:  [71.578 kg (157 lb 12.8 oz)] 71.578 kg (157 lb 12.8 oz) (12/08 0458) Last BM Date: 07/18/13 General: Did not reexamine, code in progress.      Intake/Output from previous day: 12/07 0701 - 12/08 0700 In: 750 [P.O.:100; I.V.:600; IV Piggyback:50] Out: 750 [Urine:550; Emesis/NG output:200]  Intake/Output this shift:    Lab Results:  Recent Labs  07/19/13 0400 07/20/13 0440 07/21/13 0720  WBC 14.5* 10.4 11.2*  HGB 9.4* 8.8* 10.8*  HCT 28.6* 26.8* 32.7*  PLT 148* 150 174   BMET  Recent Labs  07/19/13 0400 07/20/13 0440 07/21/13 0720  NA 135 134* 134*  K 3.4* 3.8 5.8*  CL 98 98 100  CO2 25 21 15*  GLUCOSE 81 60* 98  BUN 21 23 24*  CREATININE 0.87 0.87 1.05  CALCIUM 8.6 8.5 8.9   LFT  Recent Labs  07/19/13 0400 07/20/13 0440 07/21/13 0720  PROT 5.4* 5.4* 6.0  ALBUMIN 2.0* 2.1* 2.1*  AST 15 20 PENDING  ALT 9 11 338*  ALKPHOS 177* 152* 179*  BILITOT 0.3 0.3 0.6   PT/INR No results found for this basename: LABPROT, INR,  in the last 72 hours Hepatitis Panel No results found for this basename: HEPBSAG, HCVAB, HEPAIGM, HEPBIGM,  in the last 72 hours  Studies/Results: Ct Angio Abd/pel W/ And/or W/o 07/21/2013  COMPARISON:  Standard CT of the abdomen and pelvis with contrast on 07/18/2013.  FINDINGS: The aorta and major visceral branches are heavily calcified with diffuse atherosclerosis present. The origin of the celiac axis shows critical  narrowing and likely near subtotal occlusive disease. Distal branches are opacified and the celiac trunk is likely not completely occluded.  The proximal superior mesenteric artery shows heavily calcified plaque extending for a long distance down the trunk of the SMA. The proximal SMA trunk is completely occluded with reconstitution more distally by jejunal branches related to collateral reconstitution from celiac and IMA supplied. The inferior mesenteric artery origin is heavily calcified but open.  Bilateral renal artery stents are identified which are open. Bilateral common iliac artery stents are also present which are open. The external iliac arteries are diffusely diseased with heavily calcified plaque.  There is likely critical stenosis and near subtotal occlusion at the level of the mid right external iliac artery. Moderate narrowing of the distal external iliac artery is also present just above the inguinal ligament approaching 70-75% narrowing. At the level of the right groin, postsurgical changes are seen likely related to prior femoral bypass with an occluded proximal bypass graft present. The native SFA is also occluded at its origin.  Diffuse disease of the left external iliac artery present without significant stenosis. The common femoral artery shows irregular plaque causing approximately 50% narrowing just below the inguinal ligament. Postsurgical changes are seen in the left groin with an open proximal bypass graft identified in  the anterior thigh. The native SFA is occluded at its origin.  Nonvascular evaluation shows significant interval distension of small bowel loops which are diffusely dilated and fluid-filled. There may be some pneumatosis in the transverse duodenum and proximal jejunum as well, concerning for potential evolution of bowel ischemia to necrosis. The colon is completely decompressed. There is no evidence of free intraperitoneal air or focal abscess. The gallbladder shows some  increased distention since the prior study. There is no evidence of overt biliary obstruction, however. Stable left adrenal mass identified. This is fairly low density and likely an incidental adenoma.  Review of the MIP images confirms the above findings.  IMPRESSION: 1. 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 2. 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 3. Heavily calcified but open inferior mesenteric artery. 4. 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 Postsurgical changes are evident in the right groin with occlusion of a previously placed proximal bypass graft. 5. Open left femoral bypass graft. 6. Significant interval distension of small bowel loops which are diffusely dilated and filled with fluid. There also may be a pneumatosis in the duodenum and proximal jejunum concerning for severe ischemia/necrosis. Surgical consultation would be recommended. Critical Value/emergent results were called by telephone at the time of interpretation on 07/21/2013 at 8:00 AM to Dr.Prashant Thedore Mins, who verbally acknowledged these results.   Electronically Signed   By: Irish Lack M.D.   On: 07/21/2013 08:35    ASSESMENT:   *  Ischemic upper GI tract ulcer disease.  Mesenteric stenosis and probable SB infarct confirmed on CT angiogram.  N/V/epigastric pain  *  Shock, vascular collapse   *  Widespread vascular disease.   PLAN   *  General surgery seeing pt now.  Will consider surgery if he can be stableized.   *  Transfer to ICU.    Jennye Moccasin  07/21/2013, 8:42 AM Pager: [phone]  GI ATTENDING  Patient seen and examined. Case discussed this am in morning report with GI colleagues. Patient with recurrent pain w/ N/V secondary to mesenteric ischemia. GSU and Vascular surgery involved. Nothing further to add  from GI medicine. Prognosis poor. Will sign off  Lawerence Dery N. Eda Keys., M.D. [facility] of Gastroenterology

## 2013-07-21 NOTE — Anesthesia Preprocedure Evaluation (Signed)
Anesthesia Evaluation   Patient awake    Reviewed: Allergy & Precautions, H&P , NPO status   Airway      Comment: Intubated  Dental   Pulmonary COPDCurrent Smoker,  Intubated ventilated   + decreased breath sounds      Cardiovascular hypertension, + CAD, + Past MI and + Peripheral Vascular Disease + dysrhythmias Rhythm:Regular Rate:Tachycardia     Neuro/Psych    GI/Hepatic PUD, GERD-  ,  Endo/Other    Renal/GU      Musculoskeletal   Abdominal   Peds  Hematology  (+) anemia ,   Anesthesia Other Findings   Reproductive/Obstetrics                           Anesthesia Physical Anesthesia Plan  ASA: V  Anesthesia Plan: General   Post-op Pain Management:    Induction: Inhalational  Airway Management Planned: Oral ETT  Additional Equipment: CVP  Intra-op Plan:   Post-operative Plan: Post-operative intubation/ventilation  Informed Consent: I have reviewed the patients History and Physical, chart, labs and discussed the procedure including the risks, benefits and alternatives for the proposed anesthesia with the patient or authorized representative who has indicated his/her understanding and acceptance.   Dental advisory given  Plan Discussed with: CRNA and Surgeon  Anesthesia Plan Comments:         Anesthesia Quick Evaluation

## 2013-07-22 ENCOUNTER — Encounter (HOSPITAL_COMMUNITY): Payer: Medicare Other | Admitting: Anesthesiology

## 2013-07-22 ENCOUNTER — Ambulatory Visit: Payer: Medicare Other | Admitting: Internal Medicine

## 2013-07-22 ENCOUNTER — Inpatient Hospital Stay (HOSPITAL_COMMUNITY): Payer: Medicare Other | Admitting: Anesthesiology

## 2013-07-22 ENCOUNTER — Encounter (HOSPITAL_COMMUNITY)
Admission: EM | Disposition: A | Discharge status: Other Acute Inpatient Hospital | Payer: Self-pay | Source: Home / Self Care | Attending: Internal Medicine

## 2013-07-22 ENCOUNTER — Encounter (HOSPITAL_COMMUNITY): Payer: Self-pay | Admitting: Certified Registered"

## 2013-07-22 ENCOUNTER — Inpatient Hospital Stay (HOSPITAL_COMMUNITY): Payer: Medicare Other

## 2013-07-22 DIAGNOSIS — A419 Sepsis, unspecified organism: Secondary | ICD-10-CM

## 2013-07-22 HISTORY — PX: LAPAROTOMY: SHX154

## 2013-07-22 LAB — POCT I-STAT 3, ART BLOOD GAS (G3+)
Acid-Base Excess: 8 mmol/L — ABNORMAL HIGH (ref 0.0–2.0)
Acid-Base Excess: 6 mmol/L — ABNORMAL HIGH (ref 0.0–2.0)
Bicarbonate: 28.3 mEq/L — ABNORMAL HIGH (ref 20.0–24.0)
Bicarbonate: 29.9 mEq/L — ABNORMAL HIGH (ref 20.0–24.0)
Bicarbonate: 29.3 mEq/L — ABNORMAL HIGH (ref 20.0–24.0)
O2 Saturation: 96 %
O2 Saturation: 99 %
O2 Saturation: 99 %
Patient temperature: 97.4
TCO2: 31 mmol/L (ref 0–100)
TCO2: 30 mmol/L (ref 0–100)
pCO2 arterial: 22.9 mmHg — ABNORMAL LOW (ref 35.0–45.0)
pCO2 arterial: 30.3 mmHg — ABNORMAL LOW (ref 35.0–45.0)
pH, Arterial: 7.556 — ABNORMAL HIGH (ref 7.350–7.450)
pO2, Arterial: 60 mmHg — ABNORMAL LOW (ref 80.0–100.0)
pO2, Arterial: 102 mmHg — ABNORMAL HIGH (ref 80.0–100.0)

## 2013-07-22 LAB — LEGIONELLA ANTIGEN, URINE: Legionella Antigen, Urine: NEGATIVE

## 2013-07-22 LAB — CBC WITH DIFFERENTIAL/PLATELET
Eosinophils Relative: 0 % (ref 0–5)
Hemoglobin: 8 g/dL — ABNORMAL LOW (ref 13.0–17.0)
Lymphocytes Relative: 9 % — ABNORMAL LOW (ref 12–46)
MCH: 30.3 pg (ref 26.0–34.0)
Monocytes Absolute: 0.3 10*3/uL (ref 0.1–1.0)
Monocytes Relative: 4 % (ref 3–12)
Neutrophils Relative %: 87 % — ABNORMAL HIGH (ref 43–77)
Platelets: 91 10*3/uL — ABNORMAL LOW (ref 150–400)
RBC: 2.64 MIL/uL — ABNORMAL LOW (ref 4.22–5.81)
RDW: 14.9 % (ref 11.5–15.5)
WBC Morphology: INCREASED
WBC: 7.6 10*3/uL (ref 4.0–10.5)

## 2013-07-22 LAB — PREPARE RBC (CROSSMATCH)

## 2013-07-22 LAB — COMPREHENSIVE METABOLIC PANEL
AST: 6090 U/L — ABNORMAL HIGH (ref 0–37)
Alkaline Phosphatase: 124 U/L — ABNORMAL HIGH (ref 39–117)
BUN: 25 mg/dL — ABNORMAL HIGH (ref 6–23)
CO2: 23 mEq/L (ref 19–32)
Calcium: 7.6 mg/dL — ABNORMAL LOW (ref 8.4–10.5)
Chloride: 102 mEq/L (ref 96–112)
Creatinine, Ser: 1.07 mg/dL (ref 0.50–1.35)
GFR calc Af Amer: 76 mL/min — ABNORMAL LOW (ref >90)
GFR calc non Af Amer: 65 mL/min — ABNORMAL LOW (ref >90)
Total Bilirubin: 1.3 mg/dL — ABNORMAL HIGH (ref 0.3–1.2)

## 2013-07-22 LAB — PREPARE FRESH FROZEN PLASMA: Unit division: 0

## 2013-07-22 LAB — GLUCOSE, CAPILLARY
Glucose-Capillary: 115 mg/dL — ABNORMAL HIGH (ref 70–99)
Glucose-Capillary: 93 mg/dL (ref 70–99)
Glucose-Capillary: 94 mg/dL (ref 70–99)
Glucose-Capillary: 97 mg/dL (ref 70–99)

## 2013-07-22 LAB — POCT I-STAT 7, (LYTES, BLD GAS, ICA,H+H)
Calcium, Ion: 1.02 mmol/L — ABNORMAL LOW (ref 1.13–1.30)
HCT: 21 % — ABNORMAL LOW (ref 39.0–52.0)
Hemoglobin: 7.1 g/dL — ABNORMAL LOW (ref 13.0–17.0)
O2 Saturation: 97 %
Patient temperature: 35.9
Potassium: 3.4 mEq/L — ABNORMAL LOW (ref 3.5–5.1)
Sodium: 142 mEq/L (ref 135–145)
pCO2 arterial: 29.3 mmHg — ABNORMAL LOW (ref 35.0–45.0)
pH, Arterial: 7.592 — ABNORMAL HIGH (ref 7.350–7.450)

## 2013-07-22 LAB — PROTIME-INR: Prothrombin Time: 24.5 seconds — ABNORMAL HIGH (ref 11.6–15.2)

## 2013-07-22 LAB — VANCOMYCIN, TROUGH: Vancomycin Tr: 18 ug/mL (ref 10.0–20.0)

## 2013-07-22 SURGERY — LAPAROTOMY, EXPLORATORY
Anesthesia: General | Site: Abdomen

## 2013-07-22 MED ORDER — VANCOMYCIN HCL IN DEXTROSE 1-5 GM/200ML-% IV SOLN
1000.0000 mg | Freq: Two times a day (BID) | INTRAVENOUS | Status: DC
  Administered 2013-07-22 – 2013-07-28 (×13): 1000 mg via INTRAVENOUS
  Filled 2013-07-22 (×15): qty 200

## 2013-07-22 MED ORDER — SODIUM CHLORIDE 0.9 % IV SOLN
INTRAVENOUS | Status: AC
  Administered 2013-07-22 – 2013-07-25 (×8): via INTRAVENOUS

## 2013-07-22 MED ORDER — LACTATED RINGERS IV SOLN
INTRAVENOUS | Status: DC | PRN
  Administered 2013-07-22: 14:00:00 via INTRAVENOUS

## 2013-07-22 MED ORDER — ROCURONIUM BROMIDE 100 MG/10ML IV SOLN
INTRAVENOUS | Status: DC | PRN
  Administered 2013-07-22: 30 mg via INTRAVENOUS
  Administered 2013-07-22: 50 mg via INTRAVENOUS
  Administered 2013-07-22: 20 mg via INTRAVENOUS

## 2013-07-22 MED ORDER — 0.9 % SODIUM CHLORIDE (POUR BTL) OPTIME
TOPICAL | Status: DC | PRN
  Administered 2013-07-22 (×2): 1000 mL

## 2013-07-22 MED ORDER — VITAMIN K1 10 MG/ML IJ SOLN
10.0000 mg | Freq: Once | INTRAVENOUS | Status: AC
  Administered 2013-07-22: 10 mg via INTRAVENOUS
  Filled 2013-07-22: qty 1

## 2013-07-22 MED ORDER — ARTIFICIAL TEARS OP OINT
TOPICAL_OINTMENT | OPHTHALMIC | Status: DC | PRN
  Administered 2013-07-22: 1 via OPHTHALMIC

## 2013-07-22 SURGICAL SUPPLY — 58 items
BLADE SURG ROTATE 9660 (MISCELLANEOUS) IMPLANT
CANISTER SUCTION 2500CC (MISCELLANEOUS) ×2 IMPLANT
CHLORAPREP W/TINT 26ML (MISCELLANEOUS) ×1 IMPLANT
COVER MAYO STAND STRL (DRAPES) IMPLANT
COVER SURGICAL LIGHT HANDLE (MISCELLANEOUS) ×2 IMPLANT
DRAPE LAPAROSCOPIC ABDOMINAL (DRAPES) ×2 IMPLANT
DRAPE PROXIMA HALF (DRAPES) IMPLANT
DRAPE UTILITY 15X26 W/TAPE STR (DRAPE) ×4 IMPLANT
DRAPE WARM FLUID 44X44 (DRAPE) ×2 IMPLANT
DRSG OPSITE POSTOP 4X10 (GAUZE/BANDAGES/DRESSINGS) IMPLANT
DRSG OPSITE POSTOP 4X8 (GAUZE/BANDAGES/DRESSINGS) IMPLANT
ELECT BLADE 6.5 EXT (BLADE) IMPLANT
ELECT CAUTERY BLADE 6.4 (BLADE) ×4 IMPLANT
ELECT REM PT RETURN 9FT ADLT (ELECTROSURGICAL) ×2
ELECTRODE REM PT RTRN 9FT ADLT (ELECTROSURGICAL) ×1 IMPLANT
GLOVE BIO SURGEON STRL SZ7.5 (GLOVE) ×1 IMPLANT
GLOVE BIOGEL PI IND STRL 7.0 (GLOVE) IMPLANT
GLOVE BIOGEL PI IND STRL 7.5 (GLOVE) IMPLANT
GLOVE BIOGEL PI IND STRL 8 (GLOVE) IMPLANT
GLOVE BIOGEL PI INDICATOR 7.0 (GLOVE) ×1
GLOVE BIOGEL PI INDICATOR 7.5 (GLOVE) ×1
GLOVE BIOGEL PI INDICATOR 8 (GLOVE) ×1
GLOVE ECLIPSE 7.0 STRL STRAW (GLOVE) ×1 IMPLANT
GLOVE SURG ORTHO 8.0 STRL STRW (GLOVE) ×2 IMPLANT
GOWN STRL NON-REIN LRG LVL3 (GOWN DISPOSABLE) ×4 IMPLANT
GOWN STRL REIN XL XLG (GOWN DISPOSABLE) ×2 IMPLANT
KIT BASIN OR (CUSTOM PROCEDURE TRAY) ×2 IMPLANT
KIT ROOM TURNOVER OR (KITS) ×2 IMPLANT
LIGASURE IMPACT 36 18CM CVD LR (INSTRUMENTS) IMPLANT
NS IRRIG 1000ML POUR BTL (IV SOLUTION) ×4 IMPLANT
PACK GENERAL/GYN (CUSTOM PROCEDURE TRAY) ×2 IMPLANT
PAD ARMBOARD 7.5X6 YLW CONV (MISCELLANEOUS) ×2 IMPLANT
PENCIL BUTTON HOLSTER BLD 10FT (ELECTRODE) IMPLANT
RELOAD PROXIMATE 75MM BLUE (ENDOMECHANICALS) ×6 IMPLANT
RELOAD PROXIMATE TA60MM BLUE (ENDOMECHANICALS) ×2 IMPLANT
RELOAD STAPLE 60 BLU REG PROX (ENDOMECHANICALS) IMPLANT
RELOAD STAPLE 75 3.8 BLU REG (ENDOMECHANICALS) IMPLANT
SPECIMEN JAR LARGE (MISCELLANEOUS) ×1 IMPLANT
SPONGE GAUZE 4X4 12PLY (GAUZE/BANDAGES/DRESSINGS) ×1 IMPLANT
SPONGE LAP 18X18 X RAY DECT (DISPOSABLE) IMPLANT
STAPLER GUN LINEAR PROX 60 (STAPLE) ×1 IMPLANT
STAPLER PROXIMATE 75MM BLUE (STAPLE) ×1 IMPLANT
STAPLER VISISTAT 35W (STAPLE) ×2 IMPLANT
SUCTION POOLE TIP (SUCTIONS) ×2 IMPLANT
SUT NOVA 1 T20/GS 25DT (SUTURE) ×3 IMPLANT
SUT NOVA T20/GS 25 (SUTURE) ×1 IMPLANT
SUT PDS AB 1 TP1 96 (SUTURE) ×4 IMPLANT
SUT SILK 2 0 SH CR/8 (SUTURE) ×2 IMPLANT
SUT SILK 2 0 TIES 10X30 (SUTURE) ×2 IMPLANT
SUT SILK 3 0 SH CR/8 (SUTURE) ×2 IMPLANT
SUT SILK 3 0 TIES 10X30 (SUTURE) ×2 IMPLANT
SUT VIC AB 3-0 SH 18 (SUTURE) IMPLANT
TAPE CLOTH SURG 4X10 WHT LF (GAUZE/BANDAGES/DRESSINGS) ×1 IMPLANT
TOWEL OR 17X24 6PK STRL BLUE (TOWEL DISPOSABLE) ×2 IMPLANT
TOWEL OR 17X26 10 PK STRL BLUE (TOWEL DISPOSABLE) ×2 IMPLANT
TRAY FOLEY CATH 16FRSI W/METER (SET/KITS/TRAYS/PACK) ×1 IMPLANT
TUBE CONNECTING 12X1/4 (SUCTIONS) IMPLANT
YANKAUER SUCT BULB TIP NO VENT (SUCTIONS) IMPLANT

## 2013-07-22 NOTE — Progress Notes (Addendum)
NUTRITION FOLLOW UP  DOCUMENTATION CODES Per approved criteria  -Severe malnutrition in the context of chronic illness   Intervention:    Recommend nutrition support initiation within 24-48 hours RD to follow for nutrition care plan  Nutrition Dx:   Inadequate oral intake now related to inability to eat as evidenced by NPO status, ongoing  New Goal:   Initiate nutrition support within next 24-48 hours if prolonged intubation expected  Monitor:   Nutrition support initiation, respiratory status, weight, labs, I/O's  Assessment:   Patient with PMH of COPD, CVA and CAD; previous admitted with enteritis (thought to be Norovirus); presented again with ongoing nausea, occasional vomiting and one loose BM per day; + severe fatigue and abdominal pain  Patient s/p emergent procedure 12/8: EXPLORATORY LAPAROTOMY ENTRECTOMY (MAJORITY OF JEJUNUM & PROXIMAL ILEUM)  Patient is currently intubated on ventilator support -- OGT in place MV: 12.8 Temp (24hrs), Avg:97.7 F (36.5 C), Min:95.9 F (35.5 C), Max:99.3 F (37.4 C)   Prognosis guarded given extensive bowel ischemia.  Height: Ht Readings from Last 1 Encounters:  07/18/13 6' (1.829 m)    Weight Status:   Wt Readings from Last 1 Encounters:  07/22/13 161 lb 9.6 oz (73.3 kg)    Re-estimated needs:  Kcal: 1850-2000 Protein: 110-120 gm Fluid: per MD  Skin: Stage II pressure ulcer to sacrum  Diet Order: NPO   Intake/Output Summary (Last 24 hours) at 07/22/13 1016 Last data filed at 07/22/13 0900  Gross per 24 hour  Intake 8975.16 ml  Output   3270 ml  Net 5705.16 ml    Labs:   Recent Labs Lab 07/19/13 0400  07/21/13 0720 07/21/13 1010 07/21/13 1508 07/22/13 0400  NA 135  < > 134* 138 141 142  K 3.4*  < > 5.8* 5.4* 3.7 3.4*  CL 98  < > 100 102  --  102  CO2 25  < > 15* 16*  --  23  BUN 21  < > 24* 25*  --  25*  CREATININE 0.87  < > 1.05 1.08  --  1.07  CALCIUM 8.6  < > 8.9 8.6  --  7.6*  MG 1.7  --    --   --   --   --   PHOS 2.7  --   --   --   --   --   GLUCOSE 81  < > 98 93  --  92  < > = values in this interval not displayed.  CBG (last 3)   Recent Labs  07/22/13 0030 07/22/13 0357 07/22/13 0808  GLUCAP 111* 92 97    Scheduled Meds: . amiodarone  200 mg Oral Daily  . antiseptic oral rinse  15 mL Mouth Rinse QID  . calcium gluconate  1 g Intravenous Once  . chlorhexidine  15 mL Mouth Rinse BID  . cilostazol  100 mg Oral BID  . fluconazole (DIFLUCAN) IV  200 mg Intravenous Q24H  . hydrocortisone sodium succinate  50 mg Intravenous Q6H  . insulin aspart  0-9 Units Subcutaneous Q4H  . metronidazole  500 mg Intravenous Q8H  . pantoprazole (PROTONIX) IV  40 mg Intravenous Q12H  . piperacillin-tazobactam (ZOSYN)  IV  3.375 g Intravenous Q8H  . sodium chloride  10 mL Intravenous Q12H  . sodium chloride  3 mL Intravenous Q12H  . sodium chloride  3 mL Intravenous Q12H  . vancomycin  1,000 mg Intravenous Q12H    Continuous Infusions: . fentaNYL infusion  INTRAVENOUS 50 mcg/hr (07/22/13 0900)  . norepinephrine (LEVOPHED) Adult infusion 2 mcg/min (07/22/13 0900)    Maureen Chatters, RD, LDN Pager #: [phone] After-Hours Pager #: [phone]

## 2013-07-22 NOTE — Progress Notes (Signed)
PULMONARY  / CRITICAL CARE MEDICINE  Name: [name] MRN: [id number] DOB: 1934/11/19    ADMISSION DATE:  07/18/2013 CONSULTATION DATE: 12-8  REFERRING MD :  [facility] PRIMARY SERVICE: PCCM  CHIEF COMPLAINT:  ARF  BRIEF PATIENT DESCRIPTION:  78 yo male with with a plethora of health issues admitted with abdominal pain and CT abd demonstrates extensive ischemia changes, small bowel distension and was found to be in shock and respiratory failure and was urgently intubated and transferred to ICU.   SIGNIFICANT EVENTS / STUDIES:  12-8 intuabted, shock, surgical evaluation 12-8 DNR established  12/8 Exploratory laparotomy with entrectomy (majority of jejunum and proximal ileum)   LINES / TUBES: 12- 8 OTT>> 12-8 Rt fem cvl>>12-8 12-8 Lt IJ cvl>>  CULTURES: 12-8 bc x 2>> 12-8 sputum>> 12-8 uc>>  ANTIBIOTICS: 12-8 vanc>> 12-8 diflucan>> 12-8 pip tazo>>  HISTORY OF PRESENT ILLNESS:  78 yr old presented with Nausea and vomiting in adult with evidence of duodenitis on CT scan causing likely due to partial duodenal obstruction on CT scan ( recent Endoscopy 11-26: Three medium sized gastric antral ulcers. Single, large, gastric fundus ulcer , Angiodysplastic lesion in the gastric antrum & Duodenitis) . He had recently been admitted twice in the last 1 month once for atrial fibrillation and norvirus antritis and one week later for antritis and peptic ulcer disease.  Initial CT scan suggested mechanical duodenal obstruction versus duodenal inflammation. Was treated conservatively with bowel rest, PPI, GI was consulted. Underwent EGD on 07/20/2013 with changes were suggestive of severe erosive ulcerative gastritis, duodenitis and jujinitis - subsequently CT angiogram was ordered on 07/20/2013, suggesting that patient had diffuse calcifications of his mesenteric circulation in that his CT scan look worse for small bowel inflammation/ was suspiciou necrosis now. Found with shock and respiratory  failure and was urgently intubated and transferred to ICU.    SUBJECTIVE: sedated o fent gtt Afebrile High NG output  VITAL SIGNS: Temp:  [95.9 F (35.5 C)-99.3 F (37.4 C)] 97.7 F (36.5 C) (12/09 0817) Pulse Rate:  [81-100] 81 (12/09 1000) Resp:  [12-32] 20 (12/09 1000) BP: (86-130)/(36-72) 107/43 mmHg (12/09 1000) SpO2:  [90 %-100 %] 98 % (12/09 1000) FiO2 (%):  [50 %-60 %] 50 % (12/09 0747) Weight:  [73.3 kg (161 lb 9.6 oz)] 73.3 kg (161 lb 9.6 oz) (12/09 0500) HEMODYNAMICS: CVP:  [7 mmHg-23 mmHg] 7 mmHg VENTILATOR SETTINGS: Vent Mode:  [-] PRVC FiO2 (%):  [50 %-60 %] 50 % Set Rate:  [20 bmp-30 bmp] 20 bmp Vt Set:  [409 mL] 620 mL PEEP:  [8 cmH20] 8 cmH20 Plateau Pressure:  [20 cmH20-24 cmH20] 24 cmH20 INTAKE / OUTPUT: Intake/Output     12/08 0701 - 12/09 0700 12/09 0701 - 12/10 0700   P.O.     I.V. (mL/kg) 6610.2 (90.2) 275 (3.8)   Blood 450    NG/GT 90    IV Piggyback 1550 200   Total Intake(mL/kg) 8700.2 (118.7) 475 (6.5)   Urine (mL/kg/hr) 960 (0.5) 60 (0.3)   Emesis/NG output 1550 (0.9)    Other 600 (0.3)    Blood 100 (0.1)    Total Output 3210 60   Net +5490.2 +415          PHYSICAL EXAMINATION: General:  Elderly WM in nodistress and intubated,  Neuro: Followed commands prior to intubation, sedated now HEENT: Edentulous, No JVD/LAN  Cardiovascular:  HSR RRR Lungs:  Decreased BS BL Abdomen:  Tense/Tender, OGT with bilious drainage Musculoskeletal:  Wasted  musculature Skin:  Rt lower ext  Mottled mild upper fem region  LABS:  CBC  Recent Labs Lab 07/21/13 0720 07/21/13 1010 07/21/13 1508 07/21/13 1900 07/22/13 0400  WBC 11.2* 6.3  --   --  7.6  HGB 10.8* 8.6* 6.8* 7.8* 8.0*  HCT 32.7* 26.5* 20.0* 22.9* 23.1*  PLT 174 123*  --   --  91*   Coag's  Recent Labs Lab 07/21/13 1010 07/22/13 0800  APTT 40* 40*  INR 2.49* 2.29*   BMET  Recent Labs Lab 07/21/13 0720 07/21/13 1010 07/21/13 1508 07/22/13 0400  NA 134* 138 141 142  K  5.8* 5.4* 3.7 3.4*  CL 100 102  --  102  CO2 15* 16*  --  23  BUN 24* 25*  --  25*  CREATININE 1.05 1.08  --  1.07  GLUCOSE 98 93  --  92   Electrolytes  Recent Labs Lab 07/19/13 0400  07/21/13 0720 07/21/13 1010 07/22/13 0400  CALCIUM 8.6  < > 8.9 8.6 7.6*  MG 1.7  --   --   --   --   PHOS 2.7  --   --   --   --   < > = values in this interval not displayed. Sepsis Markers  Recent Labs Lab 07/18/13 1447 07/18/13 1512 07/21/13 1010  LATICACIDVEN 1.36 1.3 10.5*   ABG  Recent Labs Lab 07/21/13 1508 07/22/13 0400 07/22/13 0730  PHART 7.326* 7.699* 7.603*  PCO2ART 33.4* 22.9* 30.3*  PO2ART 145.0* 60.0* 102.0*   Liver Enzymes  Recent Labs Lab 07/21/13 0720 07/21/13 1010 07/22/13 0400  AST 802* 2871* 6090*  ALT 338* 1089* 1939*  ALKPHOS 179* 122* 124*  BILITOT 0.6 0.6 1.3*  ALBUMIN 2.1* 1.6* 1.9*   Cardiac Enzymes  Recent Labs Lab 07/18/13 1424 07/21/13 1010  TROPONINI  --  1.42*  PROBNP 4170.0*  --    Glucose  Recent Labs Lab 07/21/13 0922 07/21/13 1611 07/21/13 1943 07/22/13 0030 07/22/13 0357 07/22/13 0808  GLUCAP 84 126* 128* 111* 92 97    Imaging Dg Chest Port 1 View  07/21/2013   CLINICAL DATA:  Endotracheal tube and line placement  EXAM: PORTABLE CHEST - 1 VIEW  COMPARISON:  07/18/2013  FINDINGS: Endotracheal tube tip lies 1.5 cm above the carina. A left internal jugular central venous line tip lies in the mid superior vena cava. A nasogastric tube has also been placed. Its tip lies at the GE junction. It will need to be further inserted to allow the tip to fully into the stomach.  No pneumothorax evident on this semi-erect study.  Lung hyperexpansion, chronic interstitial thickening and areas of lung scarring and probable basilar subsegmental atelectasis are stable. No area of focal consolidation and no overt pulmonary edema.  IMPRESSION: 1. Endotracheal tube and left internal jugular central venous line are well positioned. No  pneumothorax. 2. Nasogastric tube tip lies at the GE junction. Recommend further inserting tube 10-15 cm to allow the tip to fully into the stomach. 3. No change in the appearance of the lungs when allowing for differences in technique and patient positioning.   Electronically Signed   By: Amie Portland M.D.   On: 07/21/2013 10:39   Ct Angio Abd/pel W/ And/or W/o  07/21/2013   CLINICAL DATA:  Epigastric abdominal pain, nausea, vomiting and weight loss. Endoscopy on 12/07 demonstrates severe erosive ulcerative gastritis as well as duodenitis and jejunitis.  EXAM: CT ANGIOGRAPHY ABDOMEN AND PELVIS WITH CONTRAST AND  WITHOUT CONTRAST  TECHNIQUE: Multidetector CT imaging of the abdomen and pelvis was performed using the standard protocol during bolus administration of intravenous contrast. Multiplanar reconstructed images including MIPs were obtained and reviewed to evaluate the vascular anatomy.  CONTRAST:  OMNIPAQUE IOHEXOL 350 MG/ML SOLN  COMPARISON:  Standard CT of the abdomen and pelvis with contrast on 07/18/2013.  FINDINGS: The aorta and major visceral branches are heavily calcified with diffuse atherosclerosis present. The origin of the celiac axis shows critical narrowing and likely near subtotal occlusive disease. Distal branches are opacified and the celiac trunk is likely not completely occluded.  The proximal superior mesenteric artery shows heavily calcified plaque extending for a long distance down the trunk of the SMA. The proximal SMA trunk is completely occluded with reconstitution more distally by jejunal branches related to collateral reconstitution from celiac and IMA supplied. The inferior mesenteric artery origin is heavily calcified but open.  Bilateral renal artery stents are identified which are open. Bilateral common iliac artery stents are also present which are open. The external iliac arteries are diffusely diseased with heavily calcified plaque.  There is likely critical stenosis  and near subtotal occlusion at the level of the mid right external iliac artery. Moderate narrowing of the distal external iliac artery is also present just above the inguinal ligament approaching 70-75% narrowing. At the level of the right groin, postsurgical changes are seen likely related to prior femoral bypass with an occluded proximal bypass graft present. The native SFA is also occluded at its origin.  Diffuse disease of the left external iliac artery present without significant stenosis. The common femoral artery shows irregular plaque causing approximately 50% narrowing just below the inguinal ligament. Postsurgical changes are seen in the left groin with an open proximal bypass graft identified in the anterior thigh. The native SFA is occluded at its origin.  Nonvascular evaluation shows significant interval distension of small bowel loops which are diffusely dilated and fluid-filled. There may be some pneumatosis in the transverse duodenum and proximal jejunum as well, concerning for potential evolution of bowel ischemia to necrosis. The colon is completely decompressed. There is no evidence of free intraperitoneal air or focal abscess. The gallbladder shows some increased distention since the prior study. There is no evidence of overt biliary obstruction, however. Stable left adrenal mass identified. This is fairly low density and likely an incidental adenoma.  Review of the MIP images confirms the above findings.  IMPRESSION: 1. 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 2. 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 3. Heavily calcified but open inferior mesenteric artery. 4. 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 Postsurgical changes are evident in the right groin with occlusion of a previously placed proximal  bypass graft. 5. Open left femoral bypass graft. 6. Significant interval distension of small bowel loops which are diffusely dilated and filled with fluid. There also may be a pneumatosis in the duodenum and proximal jejunum concerning for severe ischemia/necrosis. Surgical consultation would be recommended. Critical Value/emergent results were called by telephone at the time of interpretation on 07/21/2013 at 8:00 AM to Dr.Prashant Thedore Mins, who verbally acknowledged these results.   Electronically Signed   By: Irish Lack M.D.   On: 07/21/2013 08:35     CXR: see above  ASSESSMENT / PLAN:  PULMONARY A:Acute resp failure     COPD  with tobacco abuse hx Resp + metab alkalosis P:   Vent bundle BD's Decrease RR to 15 Lower PEEP to 5  CARDIOVASCULAR A: Shock presumed sepsis, SIRS response      CAD      Vasculopath       NSTEMI P:  Vascular surgery following Levophed to map goal, max at 25 mics cvp to 12 as goal Avoid vaso as higher risk mesenteric ischemia  RENAL A:  Acidosis, hyperkalemia, mild ARF, h/o ras P:   Dc bicarb gtt Avoid acei  GASTROINTESTINAL A:  SBO/Ischemia Shock liver P:   CCS following No vasopressin    HEMATOLOGIC A: DVT prevention Coagulopathy P:  scds  Vit K rpt dose   INFECTIOUS A:  R/o bactereial transloaction, at risk perf P:   Dc cefepime, add zosyn Continue vanc Add diflucan   ENDOCRINE A:  Hypoglycemia, cortisol > 150 P:   SSI, may need to dc Dc stress steroids  NEUROLOGIC A:  Lethargy most likely secondary to metabolic state P:   Daily WUA fent  TODAY'S SUMMARY: Guarded prognosis given extensive bowel ischemia DNR established,   Care during the described time interval was provided by me and/or other providers on the critical care team.  I have reviewed this patient's available data, including medical history, events of note, physical examination and test results as part of my evaluation  CC time x  40 m  Javarian Jakubiak  V. [phone]  07/22/2013, 10:17 AM

## 2013-07-22 NOTE — Progress Notes (Signed)
eLink Physician-Brief Progress Note Patient Name: [name] DOB: Dec 06, 1934 MRN: [id number]  Date of Service  07/22/2013   HPI/Events of Note  Resp and metabolic alkalosis with pH of 4.0/98/11/91.   eICU Interventions  Plan: Decrease RR to 20 D/C bicarb gtt Recheck ABG in 2 hours.   Intervention Category Major Interventions: Acid-Base disturbance - evaluation and management  Naeema Patlan 07/22/2013, 4:08 AM

## 2013-07-22 NOTE — Anesthesia Postprocedure Evaluation (Signed)
  Anesthesia Post-op Note  Patient: [name]  Procedure(s) Performed: Procedure(s): EXPLORATORY LAPAROTOMY, SMALL BOWEL RESECTION (N/A)  Patient Location: SICU  Anesthesia Type:General  Level of Consciousness: sedated and Patient remains intubated per anesthesia plan  Airway and Oxygen Therapy: Patient remains intubated per anesthesia plan and Patient placed on Ventilator (see vital sign flow sheet for setting)  Post-op Pain: none  Post-op Assessment: Post-op Vital signs reviewed, Patient's Cardiovascular Status Stable, Respiratory Function Stable, Patent Airway and Pain level controlled  Post-op Vital Signs: stable  Complications: No apparent anesthesia complications

## 2013-07-22 NOTE — Anesthesia Preprocedure Evaluation (Addendum)
Anesthesia Evaluation  Patient identified by MRN, date of birth, ID band Patient awake    Reviewed: Allergy & Precautions, NPO status , Patient's Chart, lab work & pertinent test results  Airway       Dental   Pulmonary COPDCurrent Smoker,  + rhonchi         Cardiovascular hypertension, Pt. on medications + CAD, + Past MI and + Peripheral Vascular Disease + dysrhythmias Atrial Fibrillation Rhythm:Regular Rate:Normal     Neuro/Psych PSYCHIATRIC DISORDERS Anxiety Depression  Neuromuscular disease    GI/Hepatic PUD, GERD-  ,  Endo/Other    Renal/GU Renal disease     Musculoskeletal   Abdominal   Peds  Hematology  (+) anemia ,   Anesthesia Other Findings   Reproductive/Obstetrics                          Anesthesia Physical Anesthesia Plan  ASA: V  Anesthesia Plan: General   Post-op Pain Management:    Induction: Inhalational  Airway Management Planned:   Additional Equipment:   Intra-op Plan:   Post-operative Plan: Post-operative intubation/ventilation  Informed Consent:   Plan Discussed with: CRNA and Anesthesiologist  Anesthesia Plan Comments:        Anesthesia Quick Evaluation

## 2013-07-22 NOTE — Anesthesia Postprocedure Evaluation (Signed)
  Anesthesia Post-op Note  Patient: [name]  Procedure(s) Performed: Procedure(s): EXPLORATORY LAPAROTOMY, SMALL BOWEL RESECTION (N/A)  Patient Location: SICU  Anesthesia Type:General  Level of Consciousness: Patient remains intubated per anesthesia plan  Airway and Oxygen Therapy: Patient remains intubated per anesthesia plan and Patient placed on Ventilator (see vital sign flow sheet for setting)  Post-op Pain: none  Post-op Assessment: Post-op Vital signs reviewed, Patient's Cardiovascular Status Stable and Respiratory Function Stable  Post-op Vital Signs: Reviewed and stable  Complications: No apparent anesthesia complications

## 2013-07-22 NOTE — Progress Notes (Signed)
General Surgery [facility]  Patient remains critically ill.  Preparing for return to OR for second look laparotomy and possible re-establishment of bowel continuity.  Velora Heckler, MD, [facility] Office: [phone]

## 2013-07-22 NOTE — Op Note (Signed)
NAMESTEVEN, BASSO NO.:  [id number]  MEDICAL RECORD NO.:  [id number]  LOCATION:  2S11C                        FACILITY:  [facility]  PHYSICIAN:  Velora Heckler, MD      DATE OF BIRTH:  1935-07-24  DATE OF PROCEDURE:  07/21/2013                              OPERATIVE REPORT   PREOPERATIVE DIAGNOSIS:  Intestinal ischemia.  POSTOPERATIVE DIAGNOSIS:  Intestinal ischemia and partial infarction of small bowel.  PROCEDURE: 1. Exploratory laparotomy. 2. Massive entrectomy (majority of jejunum and proximal ileum)  SURGEON:  Velora Heckler, MD, FACS  ASSISTANT:  Larina Earthly, MD  ANESTHESIA:  General per Dr. Sharee Holster.  ESTIMATED BLOOD LOSS:  Minimal.  PREPARATION:  ChloraPrep.  COMPLICATIONS:  None.  INDICATIONS:  The patient is a 78 year old male admitted with abdominal pain.  After workup by the [facility], the patient was noted to have intestinal ischemia.  Imaging studies revealed stenosis or occlusion of the mesenteric vasculature.  Signs of intestinal ischemia and infarction were identified.  The patient was transferred to the Critical Care Service and following resuscitation was prepared for the operating room for exploratory laparotomy.  DESCRIPTION OF PROCEDURE:  Procedures done in OR #1 at the [facility]  The patient was brought to the operating room, placed in supine position on the operating room table.  Following administration of general anesthesia, the patient was prepped and draped in the usual aseptic fashion.  After ascertaining an adequate level of anesthesia had been achieved, an upper midline abdominal incision is made with a #10 blade.  Dissection was carried through subcutaneous tissues.  Fascia was incised in the midline and the peritoneal cavity was entered cautiously.  Abdomen was explored.  There are multiple dilated loops of small bowel, which appear ischemic, dusky, and  there were multiple segments, where there was a full-thickness infarction with thinning of the bowel wall and dark blue to black discoloration.  The entire small bowel was mobilized and delivered through an extended midline incision.  The bowel was run from the ligament of Treitz to the ileocecal valve.  The proximal 20 cm of jejunum are preserved.  The remainder of the jejunum and the majority of the ileum are involved with ischemic changes or frank infarction.  The distal 150 cm of the ileum appears viable.  The colon appears viable.  The gallbladder is grossly normal.  The stomach appears viable.  Decision was made to proceed with antrectomy.  Therefore, the proximal jejunum is transected with a GIA stapler approximately 20 cm distal to the ligament of Treitz.  The ileum was transected with a GIA stapler approximately 150 cm proximal to the ileocecal valve.  The mesentery was then divided using the LigaSure and major vascular structures were suture ligated with 2-0 silk figure-of- eight sutures.  The small bowel was completely resected and submitted to pathology.  Abdomen was irrigated with warm saline.  Good hemostasis was noted. Mesentery was again inspected for hemostasis.  No attempt was made at re- establishing continuity of the small bowel at this point, as a second- look laparotomy is planned in 24 hours.  Midline abdominal incision was closed with a running #1 PDS sutures. Subcutaneous tissues irrigated.  Skin was closed with stainless steel staples.  Sterile dressings were applied.  The patient was transported, intubated, and sedated back to the intensive care unit.   Velora Heckler, MD, [facility] Office: [phone]    TMG/MEDQ  D:  07/21/2013  T:  07/22/2013  Job:  [id number]  cc:   Larina Earthly, M.D. Nelda Bucks, MD

## 2013-07-22 NOTE — Progress Notes (Signed)
[name], male   DOB: June 15, 1935, 78 y.o.   MRN: [id number] 1 Day Post-Op  Subjective: Pt responsive on vent.  Denies much pain.  Informed we are going back to the OR today.  Objective: Vital signs in last 24 hours: Temp:  [95.9 F (35.5 C)-99.3 F (37.4 C)] 97.7 F (36.5 C) (12/09 0817) Pulse Rate:  [81-100] 81 (12/09 1000) Resp:  [12-32] 20 (12/09 1000) BP: (86-130)/(36-72) 107/43 mmHg (12/09 1000) SpO2:  [90 %-100 %] 98 % (12/09 1000) FiO2 (%):  [50 %-60 %] 50 % (12/09 0747) Weight:  [161 lb 9.6 oz (73.3 kg)] 161 lb 9.6 oz (73.3 kg) (12/09 0500) Last BM Date: 07/18/13  Intake/Output from previous day: 12/08 0701 - 12/09 0700 In: 8700.2 [I.V.:6610.2; Blood:450; NG/GT:90; IV Piggyback:1550] Out: 3210 [Urine:960; Emesis/NG output:1550; Blood:100] Intake/Output this shift: Total I/O In: 475 [I.V.:275; IV Piggyback:200] Out: 60 [Urine:60]  PE: Abd: soft, still very tender and jumps with palpation, incision intact and covered  Lab Results:   Recent Labs  07/21/13 1010  07/21/13 1900 07/22/13 0400  WBC 6.3  --   --  7.6  HGB 8.6*  < > 7.8* 8.0*  HCT 26.5*  < > 22.9* 23.1*  PLT 123*  --   --  91*  < > = values in this interval not displayed. BMET  Recent Labs  07/21/13 1010 07/21/13 1508 07/22/13 0400  NA 138 141 142  K 5.4* 3.7 3.4*  CL 102  --  102  CO2 16*  --  23  GLUCOSE 93  --  92  BUN 25*  --  25*  CREATININE 1.08  --  1.07  CALCIUM 8.6  --  7.6*   PT/INR  Recent Labs  07/21/13 1010 07/22/13 0800  LABPROT 26.1* 24.5*  INR 2.49* 2.29*   CMP     Component Value Date/Time   NA 142 07/22/2013 0400   K 3.4* 07/22/2013 0400   CL 102 07/22/2013 0400   CO2 23 07/22/2013 0400   GLUCOSE 92 07/22/2013 0400   BUN 25* 07/22/2013 0400   CREATININE 1.07 07/22/2013 0400   CALCIUM 7.6* 07/22/2013 0400   PROT 4.3* 07/22/2013 0400   ALBUMIN 1.9* 07/22/2013 0400   AST 6090* 07/22/2013 0400   ALT 1939* 07/22/2013 0400   ALKPHOS 124* 07/22/2013 0400   BILITOT 1.3* 07/22/2013 0400   GFRNONAA 65* 07/22/2013 0400   GFRAA 76* 07/22/2013 0400   Lipase     Component Value Date/Time   LIPASE 11 07/21/2013 1010       Studies/Results: Dg Chest Port 1 View  07/22/2013   CLINICAL DATA:  Status post laparotomy and small bowel resection for bowel infarction.  EXAM: PORTABLE CHEST - 1 VIEW  COMPARISON:  07/21/2013.  FINDINGS: Endotracheal tube present with the tip approximately 5 cm above the carina. Central line tip lies in the upper SVC. Lungs show stable chronic disease without evidence of overt edema or airspace consolidation. The heart size is stable and within normal limits.  IMPRESSION: Stable chronic lung disease.   Electronically Signed   By: Irish Lack M.D.   On: 07/22/2013 10:41   Dg Chest Port 1 View  07/21/2013   CLINICAL DATA:  Endotracheal tube and line placement  EXAM: PORTABLE CHEST - 1 VIEW  COMPARISON:  07/18/2013  FINDINGS: Endotracheal tube tip lies 1.5 cm above the carina. A left internal jugular central venous line tip lies in the mid superior vena cava. A nasogastric  tube has also been placed. Its tip lies at the GE junction. It will need to be further inserted to allow the tip to fully into the stomach.  No pneumothorax evident on this semi-erect study.  Lung hyperexpansion, chronic interstitial thickening and areas of lung scarring and probable basilar subsegmental atelectasis are stable. No area of focal consolidation and no overt pulmonary edema.  IMPRESSION: 1. Endotracheal tube and left internal jugular central venous line are well positioned. No pneumothorax. 2. Nasogastric tube tip lies at the GE junction. Recommend further inserting tube 10-15 cm to allow the tip to fully into the stomach. 3. No change in the appearance of the lungs when allowing for differences in technique and patient positioning.   Electronically Signed   By: Amie Portland M.D.   On: 07/21/2013 10:39   Ct Angio Abd/pel W/ And/or W/o  07/21/2013    CLINICAL DATA:  Epigastric abdominal pain, nausea, vomiting and weight loss. Endoscopy on 12/07 demonstrates severe erosive ulcerative gastritis as well as duodenitis and jejunitis.  EXAM: CT ANGIOGRAPHY ABDOMEN AND PELVIS WITH CONTRAST AND WITHOUT CONTRAST  TECHNIQUE: Multidetector CT imaging of the abdomen and pelvis was performed using the standard protocol during bolus administration of intravenous contrast. Multiplanar reconstructed images including MIPs were obtained and reviewed to evaluate the vascular anatomy.  CONTRAST:  OMNIPAQUE IOHEXOL 350 MG/ML SOLN  COMPARISON:  Standard CT of the abdomen and pelvis with contrast on 07/18/2013.  FINDINGS: The aorta and major visceral branches are heavily calcified with diffuse atherosclerosis present. The origin of the celiac axis shows critical narrowing and likely near subtotal occlusive disease. Distal branches are opacified and the celiac trunk is likely not completely occluded.  The proximal superior mesenteric artery shows heavily calcified plaque extending for a long distance down the trunk of the SMA. The proximal SMA trunk is completely occluded with reconstitution more distally by jejunal branches related to collateral reconstitution from celiac and IMA supplied. The inferior mesenteric artery origin is heavily calcified but open.  Bilateral renal artery stents are identified which are open. Bilateral common iliac artery stents are also present which are open. The external iliac arteries are diffusely diseased with heavily calcified plaque.  There is likely critical stenosis and near subtotal occlusion at the level of the mid right external iliac artery. Moderate narrowing of the distal external iliac artery is also present just above the inguinal ligament approaching 70-75% narrowing. At the level of the right groin, postsurgical changes are seen likely related to prior femoral bypass with an occluded proximal bypass graft present. The native SFA is  also occluded at its origin.  Diffuse disease of the left external iliac artery present without significant stenosis. The common femoral artery shows irregular plaque causing approximately 50% narrowing just below the inguinal ligament. Postsurgical changes are seen in the left groin with an open proximal bypass graft identified in the anterior thigh. The native SFA is occluded at its origin.  Nonvascular evaluation shows significant interval distension of small bowel loops which are diffusely dilated and fluid-filled. There may be some pneumatosis in the transverse duodenum and proximal jejunum as well, concerning for potential evolution of bowel ischemia to necrosis. The colon is completely decompressed. There is no evidence of free intraperitoneal air or focal abscess. The gallbladder shows some increased distention since the prior study. There is no evidence of overt biliary obstruction, however. Stable left adrenal mass identified. This is fairly low density and likely an incidental adenoma.  Review of the  MIP images confirms the above findings.  IMPRESSION: 1. 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 2. 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 3. Heavily calcified but open inferior mesenteric artery. 4. 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 Postsurgical changes are evident in the right groin with occlusion of a previously placed proximal bypass graft. 5. Open left femoral bypass graft. 6. Significant interval distension of small bowel loops which are diffusely dilated and filled with fluid. There also may be a pneumatosis in the duodenum and proximal jejunum concerning for severe ischemia/necrosis. Surgical consultation would be recommended. Critical Value/emergent results were called by telephone at the time of  interpretation on 07/21/2013 at 8:00 AM to Dr.Prashant Thedore Mins, who verbally acknowledged these results.   Electronically Signed   By: Irish Lack M.D.   On: 07/21/2013 08:35    Anti-infectives: Anti-infectives   Start     Dose/Rate Route Frequency Ordered Stop   07/21/13 1000  fluconazole (DIFLUCAN) IVPB 200 mg     200 mg 100 mL/hr over 60 Minutes Intravenous Every 24 hours 07/21/13 0944     07/21/13 0945  metroNIDAZOLE (FLAGYL) IVPB 500 mg     500 mg 100 mL/hr over 60 Minutes Intravenous Every 8 hours 07/21/13 0933     07/21/13 0945  piperacillin-tazobactam (ZOSYN) IVPB 3.375 g     3.375 g 12.5 mL/hr over 240 Minutes Intravenous 3 times per day 07/21/13 0944     07/21/13 0800  vancomycin (VANCOCIN) IVPB 1000 mg/200 mL premix  Status:  Discontinued     1,000 mg 200 mL/hr over 60 Minutes Intravenous Every 12 hours 07/21/13 0626 07/22/13 1045   07/21/13 0615  ceFEPIme (MAXIPIME) 1 g in dextrose 5 % 50 mL IVPB  Status:  Discontinued     1 g 100 mL/hr over 30 Minutes Intravenous 3 times per day 07/21/13 0613 07/21/13 0919       Assessment/Plan  1. POD1, ex lap with SBR for ischemic bowel 2. Septic shock 3. MSOF  Plan: 1. Will give another unit of FFP today prior to OR in addition to 10mg  of Vit K given by CCM.  Patient is autoanticoagulated secondary to shock liver 2. Plan to return to OR today to re-evaluate SB.    LOS: 4 days    Kayleeann Huxford E 07/22/2013, 11:02 AM Pager: [phone]

## 2013-07-22 NOTE — Progress Notes (Signed)
ANTIBIOTIC CONSULT NOTE - FOLLOW UP  Pharmacy Consult for vancomycin Indication: pneumonia  No Known Allergies  Patient Measurements: Height: 6' (182.9 cm) Weight: 161 lb 9.6 oz (73.3 kg) IBW/kg (Calculated) : 77.6 Adjusted Body Weight:   Vital Signs: Temp: 97.2 F (36.2 C) (12/09 1800) Temp src: Oral (12/09 1800) BP: 111/52 mmHg (12/09 1900) Pulse Rate: 79 (12/09 1900) Intake/Output from previous day: 12/08 0701 - 12/09 0700 In: 8700.2 [I.V.:6610.2; Blood:450; NG/GT:90; IV Piggyback:1550] Out: 3210 [Urine:960; Emesis/NG output:1550; Blood:100] Intake/Output from this shift:    Labs:  Recent Labs  07/21/13 0720 07/21/13 1010  07/21/13 1900 07/22/13 0400 07/22/13 1424  WBC 11.2* 6.3  --   --  7.6  --   HGB 10.8* 8.6*  < > 7.8* 8.0* 7.1*  PLT 174 123*  --   --  91*  --   CREATININE 1.05 1.08  --   --  1.07  --   < > = values in this interval not displayed. Estimated Creatinine Clearance: 60.9 ml/min (by C-G formula based on Cr of 1.07). No results found for this basename: VANCOTROUGH, Leodis Binet, VANCORANDOM, GENTTROUGH, GENTPEAK, GENTRANDOM, TOBRATROUGH, TOBRAPEAK, TOBRARND, AMIKACINPEAK, AMIKACINTROU, AMIKACIN,  in the last 72 hours   Microbiology: Recent Results (from the past 720 hour(s))  MRSA PCR SCREENING     Status: None   Collection Time    07/06/13 10:25 PM      Result Value Range Status   MRSA by PCR NEGATIVE  NEGATIVE Final   Comment:            The GeneXpert MRSA Assay (FDA     approved for NASAL specimens     only), is one component of a     comprehensive MRSA colonization     surveillance program. It is not     intended to diagnose MRSA     infection nor to guide or     monitor treatment for     MRSA infections.  CLOSTRIDIUM DIFFICILE BY PCR     Status: None   Collection Time    07/08/13  4:55 PM      Result Value Range Status   C difficile by pcr NEGATIVE  NEGATIVE Final  STOOL CULTURE     Status: None   Collection Time    07/08/13  4:55  PM      Result Value Range Status   Specimen Description STOOL   Final   Special Requests Normal   Final   Culture     Final   Value: NO SALMONELLA, SHIGELLA, CAMPYLOBACTER, YERSINIA, OR E.COLI 0157:H7 ISOLATED     Performed at [facility]   Report Status 07/12/2013 FINAL   Final  CULTURE, BLOOD (ROUTINE X 2)     Status: None   Collection Time    07/21/13  7:45 AM      Result Value Range Status   Specimen Description BLOOD LEFT ARM   Final   Special Requests BOTTLES DRAWN AEROBIC ONLY 10CC   Final   Culture  Setup Time     Final   Value: 07/21/2013 13:39     Performed at [facility]   Culture     Final   Value:        BLOOD CULTURE RECEIVED NO GROWTH TO DATE CULTURE WILL BE HELD FOR 5 DAYS BEFORE ISSUING A FINAL NEGATIVE REPORT     Performed at [facility]   Report Status PENDING   Incomplete  CULTURE,  BLOOD (ROUTINE X 2)     Status: None   Collection Time    07/21/13  7:50 AM      Result Value Range Status   Specimen Description BLOOD LEFT ARM   Final   Special Requests BOTTLES DRAWN AEROBIC ONLY 6CC   Final   Culture  Setup Time     Final   Value: 07/21/2013 13:39     Performed at [facility]   Culture     Final   Value:        BLOOD CULTURE RECEIVED NO GROWTH TO DATE CULTURE WILL BE HELD FOR 5 DAYS BEFORE ISSUING A FINAL NEGATIVE REPORT     Performed at [facility]   Report Status PENDING   Incomplete  CULTURE, RESPIRATORY (NON-EXPECTORATED)     Status: None   Collection Time    07/21/13  3:10 PM      Result Value Range Status   Specimen Description ENDOTRACHEAL   Final   Special Requests NONE   Final   Gram Stain     Final   Value: FEW WBC PRESENT,BOTH PMN AND MONONUCLEAR     NO SQUAMOUS EPITHELIAL CELLS SEEN     FEW GRAM POSITIVE RODS     RARE GRAM NEGATIVE RODS     RARE GRAM POSITIVE COCCI   Culture PENDING   Incomplete   Report Status PENDING   Incomplete    Anti-infectives   Start     Dose/Rate Route Frequency  Ordered Stop   07/21/13 1000  fluconazole (DIFLUCAN) IVPB 200 mg     200 mg 100 mL/hr over 60 Minutes Intravenous Every 24 hours 07/21/13 0944     07/21/13 0945  metroNIDAZOLE (FLAGYL) IVPB 500 mg     500 mg 100 mL/hr over 60 Minutes Intravenous Every 8 hours 07/21/13 0933     07/21/13 0945  piperacillin-tazobactam (ZOSYN) IVPB 3.375 g     3.375 g 12.5 mL/hr over 240 Minutes Intravenous 3 times per day 07/21/13 0944     07/21/13 0800  vancomycin (VANCOCIN) IVPB 1000 mg/200 mL premix  Status:  Discontinued     1,000 mg 200 mL/hr over 60 Minutes Intravenous Every 12 hours 07/21/13 0626 07/22/13 1045   07/21/13 0615  ceFEPIme (MAXIPIME) 1 g in dextrose 5 % 50 mL IVPB  Status:  Discontinued     1 g 100 mL/hr over 30 Minutes Intravenous 3 times per day 07/21/13 2130 07/21/13 0919      Assessment: 78 yo male with PNA is currently on therapeutic vancomycin.  Vancomycin trough is 18.  UOP is 0.5 ml/kg/hr  Goal of Therapy:  Vancomycin trough level 15-20 mcg/ml  Plan:  1) Continue vancomycin 1g iv q12h 2) Monitor renal function closely.  Jermiah Soderman, Tsz-Yin 07/22/2013,7:06 PM

## 2013-07-22 NOTE — Preoperative (Signed)
Beta Blockers   Reason not to administer Beta Blockers:Not Applicable 

## 2013-07-22 NOTE — Progress Notes (Signed)
Subjective: Interval History: none.. unresponsive on vent  Objective: Vital signs in last 24 hours: Temp:  [95.9 F (35.5 C)-99.3 F (37.4 C)] 98.9 F (37.2 C) (12/09 0400) Pulse Rate:  [84-110] 85 (12/09 0747) Resp:  [12-32] 23 (12/09 0747) BP: (64-130)/(31-72) 117/42 mmHg (12/09 0747) SpO2:  [90 %-100 %] 100 % (12/09 0747) FiO2 (%):  [50 %-100 %] 50 % (12/09 0747) Weight:  [161 lb 9.6 oz (73.3 kg)] 161 lb 9.6 oz (73.3 kg) (12/09 0500)  Intake/Output from previous day: 12/08 0701 - 12/09 0700 In: 8570.2 [I.V.:6480.2; Blood:450; NG/GT:90; IV Piggyback:1550] Out: 3210 [Urine:960; Emesis/NG output:1550; Blood:100] Intake/Output this shift:    Abdomen soft tender. The patient grimaces with palpation. Topical femoral pulses bilaterally. Palpable lateral left anterior tibial graft pulse. No further mottling in the right leg. Still no audible flow and dorsalis pedis or posterior tibial on the right.  Lab Results:  Recent Labs  07/21/13 1010  07/21/13 1900 07/22/13 0400  WBC 6.3  --   --  7.6  HGB 8.6*  < > 7.8* 8.0*  HCT 26.5*  < > 22.9* 23.1*  PLT 123*  --   --  91*  < > = values in this interval not displayed. BMET  Recent Labs  07/21/13 0720 07/21/13 1010 07/21/13 1508  NA 134* 138 141  K 5.8* 5.4* 3.7  CL 100 102  --   CO2 15* 16*  --   GLUCOSE 98 93  --   BUN 24* 25*  --   CREATININE 1.05 1.08  --   CALCIUM 8.9 8.6  --     Studies/Results: Dg Chest 2 View  07/06/2013   CLINICAL DATA:  Epigastric pain for 1 week, low blood pressure  EXAM: CHEST  2 VIEW  COMPARISON:  06/25/2013  FINDINGS: Heart size mildly enlarged. Heavy aortic arch calcification. Vascular pattern normal. Mild diffuse interstitial change most prominent in the right middle lobe and left base. Unchanged appearance when compared to prior study, except for mild decrease in the overall prominence of interstitial change. Right internal jugular catheter has been removed.  IMPRESSION: Decreased  interstitial conspicuity suggesting resolving interstitial pulmonary edema.   Electronically Signed   By: Esperanza Heir M.D.   On: 07/06/2013 18:50   Dg Abd 1 View  07/07/2013   CLINICAL DATA:  Increased abdominal pain, evaluate for ileus  EXAM: ABDOMEN - 1 VIEW  COMPARISON:  06/17/2013  FINDINGS: Mild nonspecific gas distention of the bowel. Negative for significant obstruction or ileus. Extensive aortoiliac calcifications noted. Bilateral renal and common iliac stents present. No acute osseous finding.  IMPRESSION: Negative for significant obstruction or ileus.   Electronically Signed   By: Ruel Favors M.D.   On: 07/07/2013 08:19   US Abdomen Complete  07/08/2013   CLINICAL DATA:  Nausea.  Abdominal pain.  EXAM: ULTRASOUND ABDOMEN COMPLETE  COMPARISON:  None.  FINDINGS: Gallbladder  Several small less than 1 cm gallstones noted as well as minimal amount of sludge. No evidence of gallbladder wall thickening. No sonographic Murphy sign noted.  Common bile duct  Diameter: 4 mm  Liver  No focal lesion identified. Within normal limits in parenchymal echogenicity.  IVC  No abnormality visualized.  Pancreas  Visualized portion unremarkable.  Spleen  Size and appearance within normal limits.  Right Kidney  Length: 11.0 cm. Echogenicity within normal limits. No mass or hydronephrosis visualized.  Left Kidney  Length: 12.4 cm. Echogenicity within normal limits. No mass or hydronephrosis visualized.  Abdominal aorta  No aneurysm visualized.  IMPRESSION: Cholelithiasis. No sonographic signs of cholecystitis, biliary dilatation, or other acute findings.   Electronically Signed   By: Myles Rosenthal M.D.   On: 07/08/2013 20:26   Ct Abdomen Pelvis W Contrast  07/18/2013   CLINICAL DATA:  Vomiting. Recently discharged after being admitted for peptic ulcers.  EXAM: CT ABDOMEN AND PELVIS WITH CONTRAST  TECHNIQUE: Multidetector CT imaging of the abdomen and pelvis was performed using the standard protocol following bolus  administration of intravenous contrast.  CONTRAST:  OMNIPAQUE IOHEXOL 300 MG/ML  SOLN  COMPARISON:  06/17/2013.  FINDINGS: Multiple sigmoid colon diverticula are again demonstrated without evidence of diverticulitis. Multiple normal caliber fluid-filled loops of small bowel. Diffuse low density wall thickening in the duodenal bulb, measuring 1.2 cm in thickness on image number 26. This is producing moderate luminal narrowing. Tiny gallstones in the gallbladder. The largest measures 4 mm in maximum diameter. No gallbladder wall thickening or pericholecystic fluid.  Inhomogeneous enhancement of the liver and spleen with no discrete liver mass seen. 2.6 x 2.1 cm left adrenal mass with low density components. This was previously shown to measure 5 Hounsfield units without intravenous contrast, compatible with a benign adenoma. Small left renal cysts. Unremarkable right kidney, urinary bladder and prostate gland. No enlarged lymph nodes. No evidence of appendicitis. Dense atheromatous arterial calcifications. Bilateral renal artery stents. The no significant change in mild bibasilar atelectasis/scarring. Mild lumbar lower thoracic spine degenerative changes.  IMPRESSION: 1. Do tonight is involving the duodenal bulb with moderate luminal narrowing. 2. Normal caliber fluid-filled small bowel loops. This can be seen with gastroenteritis. 3. Cholelithiasis without evidence of cholecystitis. 4. Extensive arterial calcifications with bilateral renal artery stents. 5. Sigmoid diverticulosis. 6. Stable left adrenal adenoma.   Electronically Signed   By: Gordan Payment M.D.   On: 07/18/2013 18:27   US Renal  07/07/2013   CLINICAL DATA:  Renal failure  EXAM: RENAL/URINARY TRACT ULTRASOUND COMPLETE  COMPARISON:  CT abdomen and pelvis 06/17/2013  FINDINGS: Right Kidney  Length: 10.3 cm. Normal cortical thickness and echogenicity for age. . 3 mm echogenic non shadowing focus mid right kidney corresponding the calculus on prior  CT. No mass or hydronephrosis.  Left Kidney  Length: 11.5 cm. Normal cortical thickness and echogenicity for age. No mass, hydronephrosis or shadowing calcification.  Bladder  Appears normal for degree of bladder distention.  IMPRESSION: 3 mm nonobstructing mid right renal calculus.  Otherwise negative exam.   Electronically Signed   By: Ulyses Southward M.D.   On: 07/07/2013 08:48   Nm Hepato W/eject Fract  07/11/2013   CLINICAL DATA:  Pain.  Nausea and vomiting.  EXAM: NUCLEAR MEDICINE HEPATOBILIARY IMAGING WITH GALLBLADDER EF  TECHNIQUE: Sequential images of the abdomen were obtained out to 60 minutes following intravenous administration of radiopharmaceutical. After slow intravenous infusion of 1.67 micrograms Cholecystokinin, gallbladder ejection fraction was determined.  COMPARISON:  Ultrasound 07/08/2013.  RADIOPHARMACEUTICALS:  43mCiTc-56m Choletec  FINDINGS: Liver, gallbladder, biliary system, and bowel appear normal. At 30 min, normal ejection fraction is greater than 30%.  The patient did not experience symptoms during CCK infusion.  IMPRESSION: Normal exam.   Electronically Signed   By: Maisie Fus  Register   On: 07/11/2013 12:50   Dg Chest Port 1 View  07/21/2013   CLINICAL DATA:  Endotracheal tube and line placement  EXAM: PORTABLE CHEST - 1 VIEW  COMPARISON:  07/18/2013  FINDINGS: Endotracheal tube tip lies 1.5 cm above the carina.  A left internal jugular central venous line tip lies in the mid superior vena cava. A nasogastric tube has also been placed. Its tip lies at the GE junction. It will need to be further inserted to allow the tip to fully into the stomach.  No pneumothorax evident on this semi-erect study.  Lung hyperexpansion, chronic interstitial thickening and areas of lung scarring and probable basilar subsegmental atelectasis are stable. No area of focal consolidation and no overt pulmonary edema.  IMPRESSION: 1. Endotracheal tube and left internal jugular central venous line are well  positioned. No pneumothorax. 2. Nasogastric tube tip lies at the GE junction. Recommend further inserting tube 10-15 cm to allow the tip to fully into the stomach. 3. No change in the appearance of the lungs when allowing for differences in technique and patient positioning.   Electronically Signed   By: Amie Portland M.D.   On: 07/21/2013 10:39   Dg Chest Port 1 View  07/18/2013   CLINICAL DATA:  Vomiting.  Smoker.  EXAM: PORTABLE CHEST - 1 VIEW  COMPARISON:  07/06/2013.  FINDINGS: Normal sized heart. Post CABG changes. Decreased inspiration with minimal bibasilar atelectasis. Atheromatous arterial calcifications. Stable right basilar scarring. Stable prominence of the interstitial markings.  IMPRESSION: 1. Poor inspiration with minimal bibasilar atelectasis. 2. Stable changes of COPD with right basilar scarring.   Electronically Signed   By: Gordan Payment M.D.   On: 07/18/2013 14:28   Dg Chest Port 1 View  06/25/2013   CLINICAL DATA:  CHF.  EXAM: PORTABLE CHEST - 1 VIEW  COMPARISON:  June 22, 2013.  FINDINGS: The lungs are well expanded. The interstitial markings remain diffusely increased. The left hemidiaphragm is slightly better demonstrated today. Persistent fibrotic type density is present in the lower 1/3 of the right lung. The cardiac silhouette remains top-normal in size. The pulmonary vascularity is mildly prominent centrally. The patient has undergone previous CABG. There is calcification in the wall of the tortuous descending thoracic aorta. The right internal jugular venous catheter tip lies in the region of the distal portion of the internal jugular vein and appears to been withdrawn somewhat since the previous study.  IMPRESSION: 1. There has not been significant interval change in the appearance of the pulmonary interstitium or cardiac silhouette since the previous study. The left hemidiaphragm is slightly better demonstrated today and hazy density in the right mid and lower lung is somewhat  less conspicuous which may reflect some resolving atelectasis or interstitial edema. 2. The right internal jugular venous catheter appears to brisk been withdrawn somewhat such that its tip now arise in the region of the distal portion of the right internal jugular vein.   Electronically Signed   By: David  [place]   On: 06/25/2013 08:38   Dg Chest Port 1 View  06/22/2013   CLINICAL DATA:  Congestive heart failure  EXAM: PORTABLE CHEST - 1 VIEW  COMPARISON:  06/20/2013  FINDINGS: Cardiomegaly again noted. Status post median sternotomy. Central mild vascular congestion and mild interstitial prominence suspicious for mild interstitial edema. Worsening atelectasis or fluid in minor fissure right midlung. Stable right IJ central line position. Atherosclerotic calcifications of thoracic aorta again noted. Mild basilar atelectasis.  IMPRESSION: Central mild vascular congestion and mild interstitial prominence suspicious for mild interstitial edema. Worsening atelectasis or fluid in minor fissure right midlung. Stable right IJ central line position. Atherosclerotic calcifications of thoracic aorta again noted. Mild basilar atelectasis.   Electronically Signed   By: Lanette Hampshire.D.  On: 06/22/2013 09:38   Ct Angio Abd/pel W/ And/or W/o  07/21/2013   CLINICAL DATA:  Epigastric abdominal pain, nausea, vomiting and weight loss. Endoscopy on 12/07 demonstrates severe erosive ulcerative gastritis as well as duodenitis and jejunitis.  EXAM: CT ANGIOGRAPHY ABDOMEN AND PELVIS WITH CONTRAST AND WITHOUT CONTRAST  TECHNIQUE: Multidetector CT imaging of the abdomen and pelvis was performed using the standard protocol during bolus administration of intravenous contrast. Multiplanar reconstructed images including MIPs were obtained and reviewed to evaluate the vascular anatomy.  CONTRAST:  OMNIPAQUE IOHEXOL 350 MG/ML SOLN  COMPARISON:  Standard CT of the abdomen and pelvis with contrast on 07/18/2013.  FINDINGS: The aorta and  major visceral branches are heavily calcified with diffuse atherosclerosis present. The origin of the celiac axis shows critical narrowing and likely near subtotal occlusive disease. Distal branches are opacified and the celiac trunk is likely not completely occluded.  The proximal superior mesenteric artery shows heavily calcified plaque extending for a long distance down the trunk of the SMA. The proximal SMA trunk is completely occluded with reconstitution more distally by jejunal branches related to collateral reconstitution from celiac and IMA supplied. The inferior mesenteric artery origin is heavily calcified but open.  Bilateral renal artery stents are identified which are open. Bilateral common iliac artery stents are also present which are open. The external iliac arteries are diffusely diseased with heavily calcified plaque.  There is likely critical stenosis and near subtotal occlusion at the level of the mid right external iliac artery. Moderate narrowing of the distal external iliac artery is also present just above the inguinal ligament approaching 70-75% narrowing. At the level of the right groin, postsurgical changes are seen likely related to prior femoral bypass with an occluded proximal bypass graft present. The native SFA is also occluded at its origin.  Diffuse disease of the left external iliac artery present without significant stenosis. The common femoral artery shows irregular plaque causing approximately 50% narrowing just below the inguinal ligament. Postsurgical changes are seen in the left groin with an open proximal bypass graft identified in the anterior thigh. The native SFA is occluded at its origin.  Nonvascular evaluation shows significant interval distension of small bowel loops which are diffusely dilated and fluid-filled. There may be some pneumatosis in the transverse duodenum and proximal jejunum as well, concerning for potential evolution of bowel ischemia to necrosis. The  colon is completely decompressed. There is no evidence of free intraperitoneal air or focal abscess. The gallbladder shows some increased distention since the prior study. There is no evidence of overt biliary obstruction, however. Stable left adrenal mass identified. This is fairly low density and likely an incidental adenoma.  Review of the MIP images confirms the above findings.  IMPRESSION: 1. 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 2. 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 3. Heavily calcified but open inferior mesenteric artery. 4. 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 Postsurgical changes are evident in the right groin with occlusion of a previously placed proximal bypass graft. 5. Open left femoral bypass graft. 6. Significant interval distension of small bowel loops which are diffusely dilated and filled with fluid. There also may be a pneumatosis in the duodenum and proximal jejunum concerning for severe ischemia/necrosis. Surgical consultation would be recommended. Critical Value/emergent results were called by telephone at the time of interpretation  on 07/21/2013 at 8:00 AM to Dr.Prashant Thedore Mins, who verbally acknowledged these results.   Electronically Signed   By: Irish Lack M.D.   On: 07/21/2013 08:35   Anti-infectives: Anti-infectives   Start     Dose/Rate Route Frequency Ordered Stop   07/21/13 1000  fluconazole (DIFLUCAN) IVPB 200 mg     200 mg 100 mL/hr over 60 Minutes Intravenous Every 24 hours 07/21/13 0944     07/21/13 0945  metroNIDAZOLE (FLAGYL) IVPB 500 mg     500 mg 100 mL/hr over 60 Minutes Intravenous Every 8 hours 07/21/13 0933     07/21/13 0945  piperacillin-tazobactam (ZOSYN) IVPB 3.375 g     3.375 g 12.5 mL/hr over 240 Minutes Intravenous 3 times per day 07/21/13 0944      07/21/13 0800  vancomycin (VANCOCIN) IVPB 1000 mg/200 mL premix     1,000 mg 200 mL/hr over 60 Minutes Intravenous Every 12 hours 07/21/13 0626     07/21/13 0615  ceFEPIme (MAXIPIME) 1 g in dextrose 5 % 50 mL IVPB  Status:  Discontinued     1 g 100 mL/hr over 30 Minutes Intravenous 3 times per day 07/21/13 1324 07/21/13 0919      Assessment/Plan: s/p Procedure(s): EXPLORATORY LAPAROTOMY POSSIBLE BOWEL RESECTION (N/A) Remains critically ill. Requiring less liters 4 blood pressure. PH 7.6 who the night therefore bicarbonate drip was discontinued. 4 return to OR with Dr.Gerkin today for second look   LOS: 4 days   Setsuko Robins 07/22/2013, 8:02 AM

## 2013-07-22 NOTE — Anesthesia Postprocedure Evaluation (Signed)
  Anesthesia Post-op Note  Patient: [name]  Procedure(s) Performed: Procedure(s): EXPLORATORY LAPAROTOMY POSSIBLE BOWEL RESECTION (N/A)  Patient Location: ICU  Anesthesia Type:General  Level of Consciousness: Patient remains intubated per anesthesia plan  Airway and Oxygen Therapy: Patient remains intubated per anesthesia plan and Patient placed on Ventilator (see vital sign flow sheet for setting)  Post-op Pain: none  Post-op Assessment: Post-op Vital signs reviewed, Patient's Cardiovascular Status Stable, Respiratory Function Stable, Patent Airway and Pain level controlled  Post-op Vital Signs: Reviewed and stable  Complications: No apparent anesthesia complications

## 2013-07-22 NOTE — Brief Op Note (Signed)
07/18/2013 - 07/22/2013  2:54 PM  PATIENT:  [name]  78 y.o. male  PRE-OPERATIVE DIAGNOSIS:  ISCHEMIC BOWEL  POST-OPERATIVE DIAGNOSIS:  ISCHEMIC BOWEL  PROCEDURE:  1. Exploratory laparotomy  2. Segmental small bowel resection terminal ileum  3. Two anastomoses to re-establish intestinal continuity  SURGEON:  Surgeon(s) and Role:    * Velora Heckler, MD - Primary  PHYSICIAN ASSISTANT: Barnetta Chapel, PA-C  ANESTHESIA:   general  EBL:  Total I/O In: 1775 [I.V.:1062.5; Blood:312.5; IV Piggyback:400] Out: 205 [Urine:205]  BLOOD ADMINISTERED:  2 Units PRBC's ordered by anesthesia  DRAINS: none   LOCAL MEDICATIONS USED:  NONE  SPECIMEN:  Excision  DISPOSITION OF SPECIMEN:  PATHOLOGY  COUNTS:  YES  TOURNIQUET:  * No tourniquets in log *  DICTATION: .Other Dictation: Dictation Number [id number]  PLAN OF CARE: Admit to inpatient   PATIENT DISPOSITION:  ICU - intubated and critically ill.   Delay start of Pharmacological VTE agent (>24hrs) due to surgical blood loss or risk of bleeding: yes  Velora Heckler, MD, [facility] Office: [phone]

## 2013-07-22 NOTE — Transfer of Care (Signed)
Immediate Anesthesia Transfer of Care Note  Patient: [name]  Procedure(s) Performed: Procedure(s): EXPLORATORY LAPAROTOMY, SMALL BOWEL RESECTION (N/A)  Patient Location: SICU  Anesthesia Type:General  Level of Consciousness: Patient remains intubated per anesthesia plan  Airway & Oxygen Therapy: Patient remains intubated per anesthesia plan and Patient placed on Ventilator (see vital sign flow sheet for setting)  Post-op Assessment: Report given to PACU RN  Post vital signs: Reviewed and stable  Complications: No apparent anesthesia complications

## 2013-07-23 ENCOUNTER — Inpatient Hospital Stay (HOSPITAL_COMMUNITY): Payer: Medicare Other

## 2013-07-23 DIAGNOSIS — J9600 Acute respiratory failure, unspecified whether with hypoxia or hypercapnia: Secondary | ICD-10-CM

## 2013-07-23 DIAGNOSIS — R578 Other shock: Secondary | ICD-10-CM

## 2013-07-23 DIAGNOSIS — E43 Unspecified severe protein-calorie malnutrition: Secondary | ICD-10-CM

## 2013-07-23 LAB — POCT I-STAT 3, ART BLOOD GAS (G3+)
Acid-Base Excess: 4 mmol/L — ABNORMAL HIGH (ref 0.0–2.0)
Bicarbonate: 27.5 mEq/L — ABNORMAL HIGH (ref 20.0–24.0)
O2 Saturation: 95 %
Patient temperature: 97
pH, Arterial: 7.5 — ABNORMAL HIGH (ref 7.350–7.450)
pO2, Arterial: 66 mmHg — ABNORMAL LOW (ref 80.0–100.0)

## 2013-07-23 LAB — COMPREHENSIVE METABOLIC PANEL
Alkaline Phosphatase: 151 U/L — ABNORMAL HIGH (ref 39–117)
BUN: 30 mg/dL — ABNORMAL HIGH (ref 6–23)
Calcium: 7.3 mg/dL — ABNORMAL LOW (ref 8.4–10.5)
Chloride: 106 mEq/L (ref 96–112)
GFR calc Af Amer: 78 mL/min — ABNORMAL LOW (ref >90)
Glucose, Bld: 90 mg/dL (ref 70–99)
Potassium: 3.1 mEq/L — ABNORMAL LOW (ref 3.5–5.1)
Total Bilirubin: 2.2 mg/dL — ABNORMAL HIGH (ref 0.3–1.2)
Total Protein: 4.1 g/dL — ABNORMAL LOW (ref 6.0–8.3)

## 2013-07-23 LAB — PREPARE FRESH FROZEN PLASMA: Unit division: 0

## 2013-07-23 LAB — TYPE AND SCREEN
Donor AG Type: NEGATIVE
Donor AG Type: NEGATIVE
Unit division: 0

## 2013-07-23 LAB — GLUCOSE, CAPILLARY
Glucose-Capillary: 87 mg/dL (ref 70–99)
Glucose-Capillary: 79 mg/dL (ref 70–99)
Glucose-Capillary: 76 mg/dL (ref 70–99)
Glucose-Capillary: 80 mg/dL (ref 70–99)
Glucose-Capillary: 94 mg/dL (ref 70–99)

## 2013-07-23 LAB — CBC
HCT: 26.9 % — ABNORMAL LOW (ref 39.0–52.0)
Platelets: 54 10*3/uL — ABNORMAL LOW (ref 150–400)
RDW: 16.6 % — ABNORMAL HIGH (ref 11.5–15.5)
WBC: 12.5 10*3/uL — ABNORMAL HIGH (ref 4.0–10.5)

## 2013-07-23 MED ORDER — POTASSIUM CHLORIDE 10 MEQ/50ML IV SOLN
10.0000 meq | INTRAVENOUS | Status: AC
  Administered 2013-07-23 (×6): 10 meq via INTRAVENOUS
  Filled 2013-07-23 (×5): qty 50

## 2013-07-23 MED ORDER — TRACE MINERALS CR-CU-F-FE-I-MN-MO-SE-ZN IV SOLN
INTRAVENOUS | Status: AC
  Administered 2013-07-23: 17:00:00 via INTRAVENOUS
  Filled 2013-07-23: qty 1000

## 2013-07-23 MED ORDER — FAT EMULSION 20 % IV EMUL
240.0000 mL | INTRAVENOUS | Status: AC
  Administered 2013-07-23: 240 mL via INTRAVENOUS
  Filled 2013-07-23: qty 250

## 2013-07-23 MED ORDER — POTASSIUM CHLORIDE 10 MEQ/50ML IV SOLN
INTRAVENOUS | Status: AC
  Filled 2013-07-23: qty 50

## 2013-07-23 NOTE — Progress Notes (Signed)
PULMONARY  / CRITICAL CARE MEDICINE  Name: [name] MRN: [id number] DOB: 11-13-1934    ADMISSION DATE:  07/18/2013 CONSULTATION DATE: 12-8  REFERRING MD :  [facility] PRIMARY SERVICE: PCCM  CHIEF COMPLAINT:  ARF  BRIEF PATIENT DESCRIPTION:  78 yo male with with a plethora of health issues admitted with abdominal pain and CT abd demonstrates extensive ischemia changes, small bowel distension and was found to be in shock and respiratory failure and was urgently intubated and transferred to ICU.   SIGNIFICANT EVENTS / STUDIES:  12-8 intuabted, shock, surgical evaluation 12-8 DNR established  12/8 Exploratory laparotomy with entrectomy (majority of jejunum and proximal ileum) 12/9 Segmental small bowel resection terminal ileum  Two anastomoses to re-establish intestinal continuity    LINES / TUBES: 12- 8 OTT>> 12-8 Rt fem cvl>>12-8 12-8 Lt IJ cvl>>  CULTURES: 12-8 bc x 2>>ng 12-8 sputum>>GNR >> 12-8 uc>>  ANTIBIOTICS: 12-8 vanc>> 12-8 diflucan>> 12-8 pip tazo>>  HISTORY OF PRESENT ILLNESS:  78 yr old presented with Nausea and vomiting in adult with evidence of duodenitis on CT scan causing likely due to partial duodenal obstruction on CT scan ( recent Endoscopy 11-26: Three medium sized gastric antral ulcers. Single, large, gastric fundus ulcer , Angiodysplastic lesion in the gastric antrum & Duodenitis) . He had recently been admitted twice in the last 1 month once for atrial fibrillation and norvirus antritis and one week later for antritis and peptic ulcer disease.  Initial CT scan suggested mechanical duodenal obstruction versus duodenal inflammation. Was treated conservatively with bowel rest, PPI, GI was consulted. Underwent EGD on 07/20/2013 with changes were suggestive of severe erosive ulcerative gastritis, duodenitis and jujinitis - subsequently CT angiogram was ordered on 07/20/2013, suggesting that patient had diffuse calcifications of his mesenteric circulation in that  his CT scan look worse for small bowel inflammation/ was suspiciou necrosis now. Found with shock and respiratory failure and was urgently intubated and transferred to ICU.    SUBJECTIVE: sedated on fent gtt Afebrile NG output decreased  VITAL SIGNS: Temp:  [96.1 F (35.6 C)-97.8 F (36.6 C)] 97.4 F (36.3 C) (12/10 0759) Pulse Rate:  [65-83] 73 (12/10 0900) Resp:  [12-25] 15 (12/10 0900) BP: (102-124)/(36-54) 104/43 mmHg (12/10 0900) SpO2:  [93 %-100 %] 93 % (12/10 0900) FiO2 (%):  [40 %-50 %] 40 % (12/10 0818) Weight:  [76.6 kg (168 lb 14 oz)] 76.6 kg (168 lb 14 oz) (12/10 0500) HEMODYNAMICS: CVP:  [4 mmHg-29 mmHg] 8 mmHg VENTILATOR SETTINGS: Vent Mode:  [-] PRVC FiO2 (%):  [40 %-50 %] 40 % Set Rate:  [15 bmp] 15 bmp Vt Set:  [620 mL] 620 mL PEEP:  [5 cmH20] 5 cmH20 Plateau Pressure:  [14 cmH20-18 cmH20] 18 cmH20 INTAKE / OUTPUT: Intake/Output     12/09 0701 - 12/10 0700 12/10 0701 - 12/11 0700   I.V. (mL/kg) 3157.5 (41.2) 260 (3.4)   Blood 1000    NG/GT 30 30   IV Piggyback 962.5 187.5   Total Intake(mL/kg) 5150 (67.2) 477.5 (6.2)   Urine (mL/kg/hr) 695 (0.4) 45 (0.2)   Emesis/NG output 250 (0.1)    Other     Blood     Total Output 945 45   Net +4205 +432.5          PHYSICAL EXAMINATION: General:  Elderly WM in nodistress and intubated,  Neuro: Followed commands prior to intubation, sedated now HEENT: Edentulous, No JVD/LAN  Cardiovascular:  HSR RRR Lungs:  Decreased BS BL Abdomen:  Tense/Tender, OGT with bilious drainage Musculoskeletal:  Wasted musculature Skin:  Rt lower ext  Mottled mild upper fem region  LABS:  CBC  Recent Labs Lab 07/21/13 1010  07/22/13 0400 07/22/13 1424 07/23/13 0403  WBC 6.3  --  7.6  --  12.5*  HGB 8.6*  < > 8.0* 7.1* 9.0*  HCT 26.5*  < > 23.1* 21.0* 26.9*  PLT 123*  --  91*  --  54*  < > = values in this interval not displayed. Coag's  Recent Labs Lab 07/21/13 1010 07/22/13 0800  APTT 40* 40*  INR 2.49* 2.29*    BMET  Recent Labs Lab 07/21/13 1010  07/22/13 0400 07/22/13 1424 07/23/13 0403  NA 138  < > 142 142 143  K 5.4*  < > 3.4* 3.4* 3.1*  CL 102  --  102  --  106  CO2 16*  --  23  --  26  BUN 25*  --  25*  --  30*  CREATININE 1.08  --  1.07  --  1.05  GLUCOSE 93  --  92  --  90  < > = values in this interval not displayed. Electrolytes  Recent Labs Lab 07/19/13 0400  07/21/13 1010 07/22/13 0400 07/23/13 0403  CALCIUM 8.6  < > 8.6 7.6* 7.3*  MG 1.7  --   --   --   --   PHOS 2.7  --   --   --   --   < > = values in this interval not displayed. Sepsis Markers  Recent Labs Lab 07/18/13 1447 07/18/13 1512 07/21/13 1010  LATICACIDVEN 1.36 1.3 10.5*   ABG  Recent Labs Lab 07/22/13 1157 07/22/13 1424 07/23/13 0409  PHART 7.556* 7.592* 7.500*  PCO2ART 32.8* 29.3* 35.0  PO2ART 100.0 70.0* 66.0*   Liver Enzymes  Recent Labs Lab 07/21/13 1010 07/22/13 0400 07/23/13 0403  AST 2871* 6090* 2084*  ALT 1089* 1939* 1268*  ALKPHOS 122* 124* 151*  BILITOT 0.6 1.3* 2.2*  ALBUMIN 1.6* 1.9* 1.8*   Cardiac Enzymes  Recent Labs Lab 07/18/13 1424 07/21/13 1010  TROPONINI  --  1.42*  PROBNP 4170.0*  --    Glucose  Recent Labs Lab 07/22/13 1123 07/22/13 1544 07/22/13 2009 07/22/13 2330 07/23/13 0406 07/23/13 0757  GLUCAP 93 94 94 115* 87 79    Imaging Dg Chest Port 1 View  07/23/2013   CLINICAL DATA:  Ventilated patient.  History of abdominal surgery.  EXAM: PORTABLE CHEST - 1 VIEW  COMPARISON:  07/22/2013  FINDINGS: Endotracheal tube is 4.9 cm above the carina. Jugular central venous catheter in the upper SVC region. Nasogastric tube extends into the abdomen. Increased densities in the right lower chest could represent developing airspace disease or asymmetric edema. Slightly increased densities at the left lung base suggest atelectasis and suspect small pleural effusions. Heart size is stable. No evidence for a pneumothorax.  IMPRESSION: Increased densities  in the right lower chest are concerning for developing airspace disease or asymmetric edema.  Probable small pleural effusions.   Electronically Signed   By: Richarda Overlie M.D.   On: 07/23/2013 08:08   Dg Chest Port 1 View  07/22/2013   CLINICAL DATA:  Status post laparotomy and small bowel resection for bowel infarction.  EXAM: PORTABLE CHEST - 1 VIEW  COMPARISON:  07/21/2013.  FINDINGS: Endotracheal tube present with the tip approximately 5 cm above the carina. Central line tip lies in the upper SVC. Lungs show stable  chronic disease without evidence of overt edema or airspace consolidation. The heart size is stable and within normal limits.  IMPRESSION: Stable chronic lung disease.   Electronically Signed   By: Irish Lack M.D.   On: 07/22/2013 10:41     CXR: see above  ASSESSMENT / PLAN:  PULMONARY A:Acute resp failure     COPD with tobacco abuse hx Resp + metab alkalosis P:   Vent bundle BD's  SBTs with hope to extubate in 24h  CARDIOVASCULAR A: Shock presumed sepsis, SIRS response      CAD      Vasculopath       NSTEMI P:  Vascular surgery following Levophed off Avoid vaso as higher risk mesenteric ischemia  RENAL A:  Acidosis, hyperkalemia, mild ARF, h/o ras Now Hypokalemic P:   Dc bicarb gtt Avoid acei  GASTROINTESTINAL A:  SBO/Ischemia Shock liver H/o antral ulcers P:   CCS following No vasopressin Start TNA    HEMATOLOGIC A:  Thrombocytopenia Coagulopathy -corrected by vit K P:  scds  Will not anticoagulate - hold plavix for now   INFECTIOUS A:  R/o bactereial transloaction, at risk perf P:   ct zosyn Continue vanc Add diflucan   ENDOCRINE A:  Hypoglycemia, cortisol > 150 P:   SSI, may need to dc Dc stress steroids  NEUROLOGIC A:  Lethargy most likely secondary to metabolic state P:   Daily WUA fent  TODAY'S SUMMARY: Guarded prognosis given extensive bowel ischemia DNR established,   Care during the described time interval was  provided by me and/or other providers on the critical care team.  I have reviewed this patient's available data, including medical history, events of note, physical examination and test results as part of my evaluation  CC time x  35 m  Carder Yin V. [phone]  07/23/2013, 10:14 AM

## 2013-07-23 NOTE — Progress Notes (Signed)
PARENTERAL NUTRITION CONSULT NOTE - INITIAL  Pharmacy Consult for TPN Indication: massive bowel resection  No Known Allergies  Patient Measurements: Height: 6' (182.9 cm) Weight: 168 lb 14 oz (76.6 kg) IBW/kg (Calculated) : 77.6  Vital Signs: Temp: 97.4 F (36.3 C) (12/10 0759) Temp src: Axillary (12/10 0759) BP: 124/50 mmHg (12/10 0818) Pulse Rate: 72 (12/10 0818) Intake/Output from previous day: 12/09 0701 - 12/10 0700 In: 5057.5 [I.V.:3027.5; Blood:1000; NG/GT:30; IV Piggyback:1000] Out: 945 [Urine:695; Emesis/NG output:250] Intake/Output from this shift:    Labs:  Recent Labs  07/21/13 1010  07/22/13 0400 07/22/13 0800 07/22/13 1424 07/23/13 0403  WBC 6.3  --  7.6  --   --  12.5*  HGB 8.6*  < > 8.0*  --  7.1* 9.0*  HCT 26.5*  < > 23.1*  --  21.0* 26.9*  PLT 123*  --  91*  --   --  54*  APTT 40*  --   --  40*  --   --   INR 2.49*  --   --  2.29*  --   --   < > = values in this interval not displayed.   Recent Labs  07/21/13 0745 07/21/13 1010  07/22/13 0400 07/22/13 1424 07/23/13 0403  NA  --  138  < > 142 142 143  K  --  5.4*  < > 3.4* 3.4* 3.1*  CL  --  102  --  102  --  106  CO2  --  16*  --  23  --  26  GLUCOSE  --  93  --  92  --  90  BUN  --  25*  --  25*  --  30*  CREATININE  --  1.08  --  1.07  --  1.05  CALCIUM  --  8.6  --  7.6*  --  7.3*  PROT  --  4.5*  --  4.3*  --  4.1*  ALBUMIN  --  1.6*  --  1.9*  --  1.8*  AST  --  2871*  --  6090*  --  2084*  ALT  --  1089*  --  1939*  --  1268*  ALKPHOS  --  122*  --  124*  --  151*  BILITOT  --  0.6  --  1.3*  --  2.2*  PREALBUMIN 4.5*  --   --   --   --   --   < > = values in this interval not displayed. Estimated Creatinine Clearance: 64.8 ml/min (by C-G formula based on Cr of 1.05).    Recent Labs  07/22/13 2009 07/22/13 2330 07/23/13 0406  GLUCAP 94 115* 87    Insulin Requirements in the past 24 hours:  None; CBGs 79-115  Current Nutrition:  NPO  Nutritional Goals:   1850-2000 kCal, 110-120 grams of protein per day per RD recommendations 12/9  Admit: 42 YOM admitted with N/V, dehydration, abdominal pain and enteritis d/t norovirus. Found to have mesenteric ischemia and infarcted small bowel. Underwent small bowel resection-majority of jejunum and proximal ileum- currently has minimal amount of small bowel needed to survive. Prognosis guarded.  GI: 12/7 EGD showed changes suggestive of severe erosive ulcerative gastritis, duodenitis and jujinitis - 12/7 CT angiogram suggested diffuse calcifications of mesenteric circulation; abdomen is soft, more tender on the left than the right; has OG tube- bilious output- about in 24 hours  Endo: A1C 6.1% in Ocotober 2013, TSH  normal this admission; was on stress dose steroids- stopped yesterday. CBGs have been at goal  Lytes: Na 143, K 3.1- currently getting 6 runs per ELink protocol, Cl 106, Corrected Ca 9, Phos 2.7 (12/6), Mag 1.7 (12/6)  Renal: SCr 1.05, CrCL ~43mL/min; UOP ~0.70mL/kg/hr- per surgery note RN has stated UOP is starting to drop off  Pulm: acute resp failure on vent; 40% FiO2, PEEP 5  Cards: chronic vascular disease- more flow in left foot than right which is baseline. Shock from presumed sepsis, NSTEMI-  Was requiring Levophed, has not been charted since last night- BP 124/50, HR 60-90s. Hx AFib on amiodarone  Hepatobil: shocked liver- AST 2084, ALT 1268- improving, alk phos 151- increase from last 2 readings, Tbili 2.2, albumin 1.8, prealbumin 4.5 indicating malnutrition  Neuro: GCS 13, RASS -1 on fent drip  Heme: aPTT 40 seconds, PT 24.5, INR 2.29. Patient has received vitamin K and FFP as reversal before surgeries. Hgb 9, plts 54. Does have ulcers and anticoagulation has been held in the past d/t GI bleed  ID: high risk of perforation, r/o bacterial translocation. On vanc, Zosyn, fluconazole and metronidazole. WBC 12.5, currently afebrile. LA from 12/8 was 10.5  Best Practices: mouth care,  SCDs  TPN Access: left IJ- triple lumen (placed 12/8) TPN day#: 0 (starting 12/10 at 1800)  Plan:  1. Start Clinimix E 5/15 at 68mL/hr as patient is at risk for refeeding. Will utilize full multivitamin and trace elements. (If patient becomes jaundiced, will need to reduce to half trace elements.) Goal rate will be 51mL/hr- this will provide 100g protein and 2025 kcal daily- would meet 90% of protein needs and >100% of kcal needs. D/t premixed Clinimix solutions, unable to provide more protein without providing significantly more kcal than needed. GFR of goal rate would be 2.7mg /kg/min 2. Lipid emulsion 20% at 10mL/hr 3. Reduce NS to 46mL/hr at 1800 when TPN starts 4. Continue CBGs and sensitive SSI q4- may need to be increased if patient's CBGs increase with TPN 5. CMET, phosphorous, magnesium, triglycerides tomorrow morning. Other TPN labs as ordered 6. Follow along for clinical progression, prognosis, ability to tolerate TPN   Johnel Yielding D. Kelleen Stolze, PharmD, BCPS Clinical Pharmacist Pager: [phone] 07/23/2013 8:37 AM

## 2013-07-23 NOTE — Progress Notes (Signed)
Subjective: Interval History: none.Marland Kitchen awake on the vent. Following commands. Denies pain in his lower extremities and feet.  Objective: Vital signs in last 24 hours: Temp:  [96.1 F (35.6 C)-97.8 F (36.6 C)] 97.4 F (36.3 C) (12/10 0759) Pulse Rate:  [65-84] 72 (12/10 0818) Resp:  [12-25] 25 (12/10 0818) BP: (102-124)/(36-54) 124/50 mmHg (12/10 0818) SpO2:  [94 %-100 %] 96 % (12/10 0818) FiO2 (%):  [40 %-50 %] 40 % (12/10 0818) Weight:  [168 lb 14 oz (76.6 kg)] 168 lb 14 oz (76.6 kg) (12/10 0500)  Intake/Output from previous day: 12/09 0701 - 12/10 0700 In: 5057.5 [I.V.:3027.5; Blood:1000; NG/GT:30; IV Piggyback:1000] Out: 945 [Urine:695; Emesis/NG output:250] Intake/Output this shift:    Abdominal dressing intact. Mild tenderness. Right foot cool compared the left. Motor and sensory is intact.  Lab Results:  Recent Labs  07/22/13 0400 07/22/13 1424 07/23/13 0403  WBC 7.6  --  12.5*  HGB 8.0* 7.1* 9.0*  HCT 23.1* 21.0* 26.9*  PLT 91*  --  54*   BMET  Recent Labs  07/22/13 0400 07/22/13 1424 07/23/13 0403  NA 142 142 143  K 3.4* 3.4* 3.1*  CL 102  --  106  CO2 23  --  26  GLUCOSE 92  --  90  BUN 25*  --  30*  CREATININE 1.07  --  1.05  CALCIUM 7.6*  --  7.3*    Studies/Results: Dg Chest 2 View  07/06/2013   CLINICAL DATA:  Epigastric pain for 1 week, low blood pressure  EXAM: CHEST  2 VIEW  COMPARISON:  06/25/2013  FINDINGS: Heart size mildly enlarged. Heavy aortic arch calcification. Vascular pattern normal. Mild diffuse interstitial change most prominent in the right middle lobe and left base. Unchanged appearance when compared to prior study, except for mild decrease in the overall prominence of interstitial change. Right internal jugular catheter has been removed.  IMPRESSION: Decreased interstitial conspicuity suggesting resolving interstitial pulmonary edema.   Electronically Signed   By: Esperanza Heir M.D.   On: 07/06/2013 18:50   Dg Abd 1  View  07/07/2013   CLINICAL DATA:  Increased abdominal pain, evaluate for ileus  EXAM: ABDOMEN - 1 VIEW  COMPARISON:  06/17/2013  FINDINGS: Mild nonspecific gas distention of the bowel. Negative for significant obstruction or ileus. Extensive aortoiliac calcifications noted. Bilateral renal and common iliac stents present. No acute osseous finding.  IMPRESSION: Negative for significant obstruction or ileus.   Electronically Signed   By: Ruel Favors M.D.   On: 07/07/2013 08:19   US Abdomen Complete  07/08/2013   CLINICAL DATA:  Nausea.  Abdominal pain.  EXAM: ULTRASOUND ABDOMEN COMPLETE  COMPARISON:  None.  FINDINGS: Gallbladder  Several small less than 1 cm gallstones noted as well as minimal amount of sludge. No evidence of gallbladder wall thickening. No sonographic Murphy sign noted.  Common bile duct  Diameter: 4 mm  Liver  No focal lesion identified. Within normal limits in parenchymal echogenicity.  IVC  No abnormality visualized.  Pancreas  Visualized portion unremarkable.  Spleen  Size and appearance within normal limits.  Right Kidney  Length: 11.0 cm. Echogenicity within normal limits. No mass or hydronephrosis visualized.  Left Kidney  Length: 12.4 cm. Echogenicity within normal limits. No mass or hydronephrosis visualized.  Abdominal aorta  No aneurysm visualized.  IMPRESSION: Cholelithiasis. No sonographic signs of cholecystitis, biliary dilatation, or other acute findings.   Electronically Signed   By: Myles Rosenthal M.D.   On:  07/08/2013 20:26   Ct Abdomen Pelvis W Contrast  07/18/2013   CLINICAL DATA:  Vomiting. Recently discharged after being admitted for peptic ulcers.  EXAM: CT ABDOMEN AND PELVIS WITH CONTRAST  TECHNIQUE: Multidetector CT imaging of the abdomen and pelvis was performed using the standard protocol following bolus administration of intravenous contrast.  CONTRAST:  OMNIPAQUE IOHEXOL 300 MG/ML  SOLN  COMPARISON:  06/17/2013.  FINDINGS: Multiple sigmoid colon diverticula  are again demonstrated without evidence of diverticulitis. Multiple normal caliber fluid-filled loops of small bowel. Diffuse low density wall thickening in the duodenal bulb, measuring 1.2 cm in thickness on image number 26. This is producing moderate luminal narrowing. Tiny gallstones in the gallbladder. The largest measures 4 mm in maximum diameter. No gallbladder wall thickening or pericholecystic fluid.  Inhomogeneous enhancement of the liver and spleen with no discrete liver mass seen. 2.6 x 2.1 cm left adrenal mass with low density components. This was previously shown to measure 5 Hounsfield units without intravenous contrast, compatible with a benign adenoma. Small left renal cysts. Unremarkable right kidney, urinary bladder and prostate gland. No enlarged lymph nodes. No evidence of appendicitis. Dense atheromatous arterial calcifications. Bilateral renal artery stents. The no significant change in mild bibasilar atelectasis/scarring. Mild lumbar lower thoracic spine degenerative changes.  IMPRESSION: 1. Do tonight is involving the duodenal bulb with moderate luminal narrowing. 2. Normal caliber fluid-filled small bowel loops. This can be seen with gastroenteritis. 3. Cholelithiasis without evidence of cholecystitis. 4. Extensive arterial calcifications with bilateral renal artery stents. 5. Sigmoid diverticulosis. 6. Stable left adrenal adenoma.   Electronically Signed   By: Gordan Payment M.D.   On: 07/18/2013 18:27   US Renal  07/07/2013   CLINICAL DATA:  Renal failure  EXAM: RENAL/URINARY TRACT ULTRASOUND COMPLETE  COMPARISON:  CT abdomen and pelvis 06/17/2013  FINDINGS: Right Kidney  Length: 10.3 cm. Normal cortical thickness and echogenicity for age. . 3 mm echogenic non shadowing focus mid right kidney corresponding the calculus on prior CT. No mass or hydronephrosis.  Left Kidney  Length: 11.5 cm. Normal cortical thickness and echogenicity for age. No mass, hydronephrosis or shadowing  calcification.  Bladder  Appears normal for degree of bladder distention.  IMPRESSION: 3 mm nonobstructing mid right renal calculus.  Otherwise negative exam.   Electronically Signed   By: Ulyses Southward M.D.   On: 07/07/2013 08:48   Nm Hepato W/eject Fract  07/11/2013   CLINICAL DATA:  Pain.  Nausea and vomiting.  EXAM: NUCLEAR MEDICINE HEPATOBILIARY IMAGING WITH GALLBLADDER EF  TECHNIQUE: Sequential images of the abdomen were obtained out to 60 minutes following intravenous administration of radiopharmaceutical. After slow intravenous infusion of 1.67 micrograms Cholecystokinin, gallbladder ejection fraction was determined.  COMPARISON:  Ultrasound 07/08/2013.  RADIOPHARMACEUTICALS:  109mCiTc-60m Choletec  FINDINGS: Liver, gallbladder, biliary system, and bowel appear normal. At 30 min, normal ejection fraction is greater than 30%.  The patient did not experience symptoms during CCK infusion.  IMPRESSION: Normal exam.   Electronically Signed   By: Maisie Fus  Register   On: 07/11/2013 12:50   Dg Chest Port 1 View  07/23/2013   CLINICAL DATA:  Ventilated patient.  History of abdominal surgery.  EXAM: PORTABLE CHEST - 1 VIEW  COMPARISON:  07/22/2013  FINDINGS: Endotracheal tube is 4.9 cm above the carina. Jugular central venous catheter in the upper SVC region. Nasogastric tube extends into the abdomen. Increased densities in the right lower chest could represent developing airspace disease or asymmetric edema. Slightly increased densities  at the left lung base suggest atelectasis and suspect small pleural effusions. Heart size is stable. No evidence for a pneumothorax.  IMPRESSION: Increased densities in the right lower chest are concerning for developing airspace disease or asymmetric edema.  Probable small pleural effusions.   Electronically Signed   By: Richarda Overlie M.D.   On: 07/23/2013 08:08   Dg Chest Port 1 View  07/22/2013   CLINICAL DATA:  Status post laparotomy and small bowel resection for bowel  infarction.  EXAM: PORTABLE CHEST - 1 VIEW  COMPARISON:  07/21/2013.  FINDINGS: Endotracheal tube present with the tip approximately 5 cm above the carina. Central line tip lies in the upper SVC. Lungs show stable chronic disease without evidence of overt edema or airspace consolidation. The heart size is stable and within normal limits.  IMPRESSION: Stable chronic lung disease.   Electronically Signed   By: Irish Lack M.D.   On: 07/22/2013 10:41   Dg Chest Port 1 View  07/21/2013   CLINICAL DATA:  Endotracheal tube and line placement  EXAM: PORTABLE CHEST - 1 VIEW  COMPARISON:  07/18/2013  FINDINGS: Endotracheal tube tip lies 1.5 cm above the carina. A left internal jugular central venous line tip lies in the mid superior vena cava. A nasogastric tube has also been placed. Its tip lies at the GE junction. It will need to be further inserted to allow the tip to fully into the stomach.  No pneumothorax evident on this semi-erect study.  Lung hyperexpansion, chronic interstitial thickening and areas of lung scarring and probable basilar subsegmental atelectasis are stable. No area of focal consolidation and no overt pulmonary edema.  IMPRESSION: 1. Endotracheal tube and left internal jugular central venous line are well positioned. No pneumothorax. 2. Nasogastric tube tip lies at the GE junction. Recommend further inserting tube 10-15 cm to allow the tip to fully into the stomach. 3. No change in the appearance of the lungs when allowing for differences in technique and patient positioning.   Electronically Signed   By: Amie Portland M.D.   On: 07/21/2013 10:39   Dg Chest Port 1 View  07/18/2013   CLINICAL DATA:  Vomiting.  Smoker.  EXAM: PORTABLE CHEST - 1 VIEW  COMPARISON:  07/06/2013.  FINDINGS: Normal sized heart. Post CABG changes. Decreased inspiration with minimal bibasilar atelectasis. Atheromatous arterial calcifications. Stable right basilar scarring. Stable prominence of the interstitial  markings.  IMPRESSION: 1. Poor inspiration with minimal bibasilar atelectasis. 2. Stable changes of COPD with right basilar scarring.   Electronically Signed   By: Gordan Payment M.D.   On: 07/18/2013 14:28   Dg Chest Port 1 View  06/25/2013   CLINICAL DATA:  CHF.  EXAM: PORTABLE CHEST - 1 VIEW  COMPARISON:  June 22, 2013.  FINDINGS: The lungs are well expanded. The interstitial markings remain diffusely increased. The left hemidiaphragm is slightly better demonstrated today. Persistent fibrotic type density is present in the lower 1/3 of the right lung. The cardiac silhouette remains top-normal in size. The pulmonary vascularity is mildly prominent centrally. The patient has undergone previous CABG. There is calcification in the wall of the tortuous descending thoracic aorta. The right internal jugular venous catheter tip lies in the region of the distal portion of the internal jugular vein and appears to been withdrawn somewhat since the previous study.  IMPRESSION: 1. There has not been significant interval change in the appearance of the pulmonary interstitium or cardiac silhouette since the previous study. The left  hemidiaphragm is slightly better demonstrated today and hazy density in the right mid and lower lung is somewhat less conspicuous which may reflect some resolving atelectasis or interstitial edema. 2. The right internal jugular venous catheter appears to brisk been withdrawn somewhat such that its tip now arise in the region of the distal portion of the right internal jugular vein.   Electronically Signed   By: David  [place]   On: 06/25/2013 08:38   Ct Angio Abd/pel W/ And/or W/o  07/21/2013   CLINICAL DATA:  Epigastric abdominal pain, nausea, vomiting and weight loss. Endoscopy on 12/07 demonstrates severe erosive ulcerative gastritis as well as duodenitis and jejunitis.  EXAM: CT ANGIOGRAPHY ABDOMEN AND PELVIS WITH CONTRAST AND WITHOUT CONTRAST  TECHNIQUE: Multidetector CT imaging of the  abdomen and pelvis was performed using the standard protocol during bolus administration of intravenous contrast. Multiplanar reconstructed images including MIPs were obtained and reviewed to evaluate the vascular anatomy.  CONTRAST:  OMNIPAQUE IOHEXOL 350 MG/ML SOLN  COMPARISON:  Standard CT of the abdomen and pelvis with contrast on 07/18/2013.  FINDINGS: The aorta and major visceral branches are heavily calcified with diffuse atherosclerosis present. The origin of the celiac axis shows critical narrowing and likely near subtotal occlusive disease. Distal branches are opacified and the celiac trunk is likely not completely occluded.  The proximal superior mesenteric artery shows heavily calcified plaque extending for a long distance down the trunk of the SMA. The proximal SMA trunk is completely occluded with reconstitution more distally by jejunal branches related to collateral reconstitution from celiac and IMA supplied. The inferior mesenteric artery origin is heavily calcified but open.  Bilateral renal artery stents are identified which are open. Bilateral common iliac artery stents are also present which are open. The external iliac arteries are diffusely diseased with heavily calcified plaque.  There is likely critical stenosis and near subtotal occlusion at the level of the mid right external iliac artery. Moderate narrowing of the distal external iliac artery is also present just above the inguinal ligament approaching 70-75% narrowing. At the level of the right groin, postsurgical changes are seen likely related to prior femoral bypass with an occluded proximal bypass graft present. The native SFA is also occluded at its origin.  Diffuse disease of the left external iliac artery present without significant stenosis. The common femoral artery shows irregular plaque causing approximately 50% narrowing just below the inguinal ligament. Postsurgical changes are seen in the left groin with an open  proximal bypass graft identified in the anterior thigh. The native SFA is occluded at its origin.  Nonvascular evaluation shows significant interval distension of small bowel loops which are diffusely dilated and fluid-filled. There may be some pneumatosis in the transverse duodenum and proximal jejunum as well, concerning for potential evolution of bowel ischemia to necrosis. The colon is completely decompressed. There is no evidence of free intraperitoneal air or focal abscess. The gallbladder shows some increased distention since the prior study. There is no evidence of overt biliary obstruction, however. Stable left adrenal mass identified. This is fairly low density and likely an incidental adenoma.  Review of the MIP images confirms the above findings.  IMPRESSION: 1. 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 2. 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 3. Heavily calcified but open inferior mesenteric artery. 4. Significant stenoses of the right external iliac  artery. Postsurgical changes are evident in the right groin with occlusion of a previously placed proximal bypass graft. 5. Open left femoral bypass graft. 6. Significant interval distension of small bowel loops which are diffusely dilated and filled with fluid. There also may be a pneumatosis in the duodenum and proximal jejunum concerning for severe ischemia/necrosis. Surgical consultation would be recommended. Critical Value/emergent results were called by telephone at the time of interpretation on 07/21/2013 at 8:00 AM to Dr.Prashant Thedore Mins, who verbally acknowledged these results.   Electronically Signed   By: Irish Lack M.D.   On: 07/21/2013 08:35   Anti-infectives: Anti-infectives   Start     Dose/Rate Route Frequency Ordered Stop   07/22/13 2200  vancomycin  (VANCOCIN) IVPB 1000 mg/200 mL premix     1,000 mg 200 mL/hr over 60 Minutes Intravenous Every 12 hours 07/22/13 2056     07/21/13 1000  fluconazole (DIFLUCAN) IVPB 200 mg     200 mg 100 mL/hr over 60 Minutes Intravenous Every 24 hours 07/21/13 0944     07/21/13 0945  metroNIDAZOLE (FLAGYL) IVPB 500 mg     500 mg 100 mL/hr over 60 Minutes Intravenous Every 8 hours 07/21/13 0933     07/21/13 0945  piperacillin-tazobactam (ZOSYN) IVPB 3.375 g     3.375 g 12.5 mL/hr over 240 Minutes Intravenous 3 times per day 07/21/13 0944     07/21/13 0800  vancomycin (VANCOCIN) IVPB 1000 mg/200 mL premix  Status:  Discontinued     1,000 mg 200 mL/hr over 60 Minutes Intravenous Every 12 hours 07/21/13 0626 07/22/13 1045   07/21/13 0615  ceFEPIme (MAXIPIME) 1 g in dextrose 5 % 50 mL IVPB  Status:  Discontinued     1 g 100 mL/hr over 30 Minutes Intravenous 3 times per day 07/21/13 4098 07/21/13 0919      Assessment/Plan: s/p Procedure(s): EXPLORATORY LAPAROTOMY, SMALL BOWEL RESECTION (N/A) Remains critically ill but better. Less flow in his right foot and left which is his baseline. Left femoral anterior bypass is patent. Motor and sensory intact in his right foot. No need for immediate intervention.   LOS: 5 days   EARLY, TODD 07/23/2013, 8:45 AM

## 2013-07-23 NOTE — Progress Notes (Signed)
NUTRITION CONSULT/FOLLOW UP  DOCUMENTATION CODES Per approved criteria  -Severe malnutrition in the context of chronic illness   Intervention:    TPN per pharmacy  Monitor Mg, Phos, K+ levels RD to follow for nutrition care plan  Nutrition Dx:   Inadequate oral intake now related to inability to eat as evidenced by NPO status, ongoing  Goal:   TPN to meet > 90% of estimated nutrition needs, progressing  Monitor:   TPN presciption, respiratory status, weight, labs, I/O's  Assessment:   Patient with PMH of COPD, CVA and CAD; previous admitted with enteritis (thought to be Norovirus); presented again with ongoing nausea, occasional vomiting and one loose BM per day; + severe fatigue and abdominal pain  Patient s/p emergent procedure 12/8: EXPLORATORY LAPAROTOMY ENTRECTOMY (MAJORITY OF JEJUNUM & PROXIMAL ILEUM)  Patient is currently intubated on ventilator support -- OGT in place MV: 7.6 Temp (24hrs), Avg:97 F (36.1 C), Min:96.1 F (35.6 C), Max:98.2 F (36.8 C)   RD consulted for new TPN.  Patient is receiving TPN with Clinimix E 5/15 @ 30 ml/hr and lipids @ 10 ml/hr. Provides 991 kcal and 36 grams protein per day. Meets 60% minimum estimated energy needs and 33% minimum estimated protein needs.  Patient at risk for refeeding syndrome with TPN advancement given malnutrition.  Height: Ht Readings from Last 1 Encounters:  07/18/13 6' (1.829 m)    Weight Status:   Wt Readings from Last 1 Encounters:  07/23/13 168 lb 14 oz (76.6 kg)    Body mass index is 22.9 kg/(m^2).  Re-estimated needs:  Kcal: 1650-1800 Protein: 110-120 gm Fluid: per MD  Skin: Stage II pressure ulcer to sacrum  Diet Order: NPO   Intake/Output Summary (Last 24 hours) at 07/23/13 1309 Last data filed at 07/23/13 1200  Gross per 24 hour  Intake 4887.5 ml  Output    835 ml  Net 4052.5 ml    Labs:   Recent Labs Lab 07/19/13 0400  07/21/13 1010  07/22/13 0400 07/22/13 1424  07/23/13 0403  NA 135  < > 138  < > 142 142 143  K 3.4*  < > 5.4*  < > 3.4* 3.4* 3.1*  CL 98  < > 102  --  102  --  106  CO2 25  < > 16*  --  23  --  26  BUN 21  < > 25*  --  25*  --  30*  CREATININE 0.87  < > 1.08  --  1.07  --  1.05  CALCIUM 8.6  < > 8.6  --  7.6*  --  7.3*  MG 1.7  --   --   --   --   --   --   PHOS 2.7  --   --   --   --   --   --   GLUCOSE 81  < > 93  --  92  --  90  < > = values in this interval not displayed.  CBG (last 3)   Recent Labs  07/23/13 0406 07/23/13 0757 07/23/13 1157  GLUCAP 87 79 76    Scheduled Meds: . amiodarone  200 mg Oral Daily  . antiseptic oral rinse  15 mL Mouth Rinse QID  . calcium gluconate  1 g Intravenous Once  . chlorhexidine  15 mL Mouth Rinse BID  . cilostazol  100 mg Oral BID  . fluconazole (DIFLUCAN) IV  200 mg Intravenous Q24H  . insulin aspart  0-9 Units Subcutaneous Q4H  . metronidazole  500 mg Intravenous Q8H  . pantoprazole (PROTONIX) IV  40 mg Intravenous Q12H  . piperacillin-tazobactam (ZOSYN)  IV  3.375 g Intravenous Q8H  . sodium chloride  10 mL Intravenous Q12H  . sodium chloride  3 mL Intravenous Q12H  . sodium chloride  3 mL Intravenous Q12H  . vancomycin  1,000 mg Intravenous Q12H    Continuous Infusions: . sodium chloride 125 mL/hr at 07/23/13 1200  . Marland KitchenTPN (CLINIMIX-E) Adult     And  . fat emulsion    . fentaNYL infusion INTRAVENOUS 50 mcg/hr (07/23/13 1200)  . norepinephrine (LEVOPHED) Adult infusion Stopped (07/22/13 1800)    Maureen Chatters, RD, LDN Pager #: [phone] After-Hours Pager #: [phone]

## 2013-07-23 NOTE — Progress Notes (Addendum)
Late entry patient was seen around 9:30 AM on 07/20/2013, case was discussed with GI physician Dr. Juanda Chance , and the plan was to proceed for EGD later that day. Wife was bedside and patient was actually feeling better that day.      [facility]                                                                                Patient Demographics  [name], is a 78 y.o. male, DOB - Jan 01, 1935, ZOX:[id number]  Admit date - 07/18/2013   Admitting Physician Therisa Doyne, MD  Outpatient Primary MD for the patient is [name], MD  LOS - 5   Chief Complaint  Patient presents with  . Emesis        Assessment & Plan    1. Nausea and vomiting in adult with evidence of duodenitis on CT scan causing likely due to partial duodenal obstruction (Endoscopy 11-26: Three medium sized gastric antral ulcers. 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 Duodenitis) . This could be inflammatory in nature. Continue PPI, he is somewhat better further emesis, case discussed with GI physician Dr. Juanda Chance who plans to proceed with EGD later today. Bicarbonate on 07/20/2013 was 21 and stable.    2. Dehydration with mild hyperkalemia secondary to nausea and vomiting. Supportive care as above, he is feeling better, pain has improved and so as his nausea, IV fluids with potassium to continue. Monitor BMP.      3. Hypertension continue coreg      4. History of atrial fibrillation and Coronary artery disease - able on telemetry, symptom-free, continue beta blocker goal will be rate controlled, not a candidate for anticoagulation due to recent history of upper GI bleed and active gastritis and duodenitis. Antiplatelet medications on hold due to ongoing active GI issues. Patient family had refused for anticoagulation during previous admission few weeks ago.      5. Recent history and norvirus will put on contact precautions, check GI pathogen  panel.       Code Status:  Full  Family Communication: wife  Disposition Plan: Home   Procedures CT Abd Pelvis   Consults  GI   Medications  Scheduled Meds: . amiodarone  200 mg Oral Daily  . antiseptic oral rinse  15 mL Mouth Rinse QID  . calcium gluconate  1 g Intravenous Once  . chlorhexidine  15 mL Mouth Rinse BID  . cilostazol  100 mg Oral BID  . fluconazole (DIFLUCAN) IV  200 mg Intravenous Q24H  . insulin aspart  0-9 Units Subcutaneous Q4H  . metronidazole  500 mg Intravenous Q8H  . pantoprazole (PROTONIX) IV  40 mg Intravenous Q12H  . piperacillin-tazobactam (ZOSYN)  IV  3.375 g Intravenous Q8H  . sodium chloride  10 mL Intravenous Q12H  . sodium chloride  3 mL Intravenous Q12H  . sodium chloride  3 mL Intravenous Q12H  . vancomycin  1,000 mg Intravenous Q12H   Continuous Infusions: . sodium chloride 125 mL/hr at 07/23/13 1500  . Marland KitchenTPN (CLINIMIX-E) Adult     And  . fat emulsion    . fentaNYL infusion INTRAVENOUS  40 mcg/hr (07/23/13 1500)  . norepinephrine (LEVOPHED) Adult infusion Stopped (07/22/13 1800)   PRN Meds:.fentaNYL, levalbuterol, ondansetron (ZOFRAN) IV, ondansetron (ZOFRAN) IV, sodium chloride, sodium chloride  DVT Prophylaxis  SCDs   Lab Results  Component Value Date   PLT 54* 07/23/2013    Antibiotics   Anti-infectives   Start     Dose/Rate Route Frequency Ordered Stop   07/22/13 2200  vancomycin (VANCOCIN) IVPB 1000 mg/200 mL premix     1,000 mg 200 mL/hr over 60 Minutes Intravenous Every 12 hours 07/22/13 2056     07/21/13 1000  fluconazole (DIFLUCAN) IVPB 200 mg     200 mg 100 mL/hr over 60 Minutes Intravenous Every 24 hours 07/21/13 0944     07/21/13 0945  metroNIDAZOLE (FLAGYL) IVPB 500 mg     500 mg 100 mL/hr over 60 Minutes Intravenous Every 8 hours 07/21/13 0933     07/21/13 0945  piperacillin-tazobactam (ZOSYN) IVPB 3.375 g     3.375 g 12.5 mL/hr over 240 Minutes Intravenous 3 times per day 07/21/13 0944     07/21/13  0800  vancomycin (VANCOCIN) IVPB 1000 mg/200 mL premix  Status:  Discontinued     1,000 mg 200 mL/hr over 60 Minutes Intravenous Every 12 hours 07/21/13 0626 07/22/13 1045   07/21/13 0615  ceFEPIme (MAXIPIME) 1 g in dextrose 5 % 50 mL IVPB  Status:  Discontinued     1 g 100 mL/hr over 30 Minutes Intravenous 3 times per day 07/21/13 0613 07/21/13 0919        Subjective:   [name] today has, No headache, No chest pain, improved abdominal pain & Nausea, No new weakness tingling or numbness, No Cough - SOB.  Objective:   Filed Vitals:   07/23/13 1300 07/23/13 1400 07/23/13 1500 07/23/13 1549  BP: 112/47 112/49 108/44 112/42  Pulse: 84 76 70   Temp:      TempSrc:      Resp: 13 15 15    Height:      Weight:      SpO2: 95% 94% 95%     Wt Readings from Last 3 Encounters:  07/23/13 76.6 kg (168 lb 14 oz)  07/23/13 76.6 kg (168 lb 14 oz)  07/23/13 76.6 kg (168 lb 14 oz)     Intake/Output Summary (Last 24 hours) at 07/23/13 1552 Last data filed at 07/23/13 1500  Gross per 24 hour  Intake   4852 ml  Output    980 ml  Net   3872 ml    Exam Awake Alert, Oriented X 3, No new F.N deficits, Normal affect [place].AT,PERRAL Supple Neck,No JVD, No cervical lymphadenopathy appriciated.  Symmetrical Chest wall movement, Good air movement bilaterally, CTAB RRR,No Gallops,Rubs or new Murmurs, No Parasternal Heave +ve B.Sounds, Abd Soft, mild preiumblical tenderness, No organomegaly appriciated, No rebound - guarding or rigidity. No Cyanosis, Clubbing or edema, No new Rash or bruise      Data Review   Micro Results Recent Results (from the past 240 hour(s))  CULTURE, BLOOD (ROUTINE X 2)     Status: None   Collection Time    07/21/13  7:45 AM      Result Value Range Status   Specimen Description BLOOD LEFT ARM   Final   Special Requests BOTTLES DRAWN AEROBIC ONLY 10CC   Final   Culture  Setup Time     Final   Value: 07/21/2013 13:39     Performed at [facility]  Final   Value:        BLOOD CULTURE RECEIVED NO GROWTH TO DATE CULTURE WILL BE HELD FOR 5 DAYS BEFORE ISSUING A FINAL NEGATIVE REPORT     Performed at [facility]   Report Status PENDING   Incomplete  CULTURE, BLOOD (ROUTINE X 2)     Status: None   Collection Time    07/21/13  7:50 AM      Result Value Range Status   Specimen Description BLOOD LEFT ARM   Final   Special Requests BOTTLES DRAWN AEROBIC ONLY 6CC   Final   Culture  Setup Time     Final   Value: 07/21/2013 13:39     Performed at [facility]   Culture     Final   Value:        BLOOD CULTURE RECEIVED NO GROWTH TO DATE CULTURE WILL BE HELD FOR 5 DAYS BEFORE ISSUING A FINAL NEGATIVE REPORT     Performed at [facility]   Report Status PENDING   Incomplete  CULTURE, RESPIRATORY (NON-EXPECTORATED)     Status: None   Collection Time    07/21/13  3:10 PM      Result Value Range Status   Specimen Description ENDOTRACHEAL   Final   Special Requests NONE   Final   Gram Stain     Final   Value: FEW WBC PRESENT,BOTH PMN AND MONONUCLEAR     NO SQUAMOUS EPITHELIAL CELLS SEEN     FEW GRAM POSITIVE RODS     RARE GRAM NEGATIVE RODS     RARE GRAM POSITIVE COCCI   Culture     Final   Value: MODERATE GRAM NEGATIVE RODS     Performed at [facility]   Report Status PENDING   Incomplete    Radiology Reports   Ct Abdomen Pelvis W Contrast  07/18/2013   CLINICAL DATA:  Vomiting. Recently discharged after being admitted for peptic ulcers.  EXAM: CT ABDOMEN AND PELVIS WITH CONTRAST  TECHNIQUE: Multidetector CT imaging of the abdomen and pelvis was performed using the standard protocol following bolus administration of intravenous contrast.  CONTRAST:  OMNIPAQUE IOHEXOL 300 MG/ML  SOLN  COMPARISON:  06/17/2013.  FINDINGS: Multiple sigmoid colon diverticula are again demonstrated without evidence of diverticulitis. Multiple normal caliber fluid-filled loops of small bowel. Diffuse low density wall  thickening in the duodenal bulb, measuring 1.2 cm in thickness on image number 26. This is producing moderate luminal narrowing. Tiny gallstones in the gallbladder. The largest measures 4 mm in maximum diameter. No gallbladder wall thickening or pericholecystic fluid.  Inhomogeneous enhancement of the liver and spleen with no discrete liver mass seen. 2.6 x 2.1 cm left adrenal mass with low density components. This was previously shown to measure 5 Hounsfield units without intravenous contrast, compatible with a benign adenoma. Small left renal cysts. Unremarkable right kidney, urinary bladder and prostate gland. No enlarged lymph nodes. No evidence of appendicitis. Dense atheromatous arterial calcifications. Bilateral renal artery stents. The no significant change in mild bibasilar atelectasis/scarring. Mild lumbar lower thoracic spine degenerative changes.  IMPRESSION: 1. Do tonight is involving the duodenal bulb with moderate luminal narrowing. 2. Normal caliber fluid-filled small bowel loops. This can be seen with gastroenteritis. 3. Cholelithiasis without evidence of cholecystitis. 4. Extensive arterial calcifications with bilateral renal artery stents. 5. Sigmoid diverticulosis. 6. Stable left adrenal adenoma.   Electronically Signed   By: Gordan Payment M.D.   On: 07/18/2013 18:27  Dg Chest Port 1 View  07/18/2013   CLINICAL DATA:  Vomiting.  Smoker.  EXAM: PORTABLE CHEST - 1 VIEW  COMPARISON:  07/06/2013.  FINDINGS: Normal sized heart. Post CABG changes. Decreased inspiration with minimal bibasilar atelectasis. Atheromatous arterial calcifications. Stable right basilar scarring. Stable prominence of the interstitial markings.  IMPRESSION: 1. Poor inspiration with minimal bibasilar atelectasis. 2. Stable changes of COPD with right basilar scarring.   Electronically Signed   By: Gordan Payment M.D.   On: 07/18/2013 14:28      CBC  Recent Labs Lab 07/20/13 0440 07/21/13 0720 07/21/13 1010  07/21/13 1508 07/21/13 1900 07/22/13 0400 07/22/13 1424 07/23/13 0403  WBC 10.4 11.2* 6.3  --   --  7.6  --  12.5*  HGB 8.8* 10.8* 8.6* 6.8* 7.8* 8.0* 7.1* 9.0*  HCT 26.8* 32.7* 26.5* 20.0* 22.9* 23.1* 21.0* 26.9*  PLT 150 174 123*  --   --  91*  --  54*  MCV 93.1 91.6 92.3  --   --  87.5  --  87.3  MCH 30.6 30.3 30.0  --   --  30.3  --  29.2  MCHC 32.8 33.0 32.5  --   --  34.6  --  33.5  RDW 15.3 15.5 15.6*  --   --  14.9  --  16.6*  LYMPHSABS  --   --   --   --   --  0.7  --   --   MONOABS  --   --   --   --   --  0.3  --   --   EOSABS  --   --   --   --   --  0.0  --   --   BASOSABS  --   --   --   --   --  0.0  --   --     Chemistries   Recent Labs Lab 07/19/13 0400 07/20/13 0440 07/21/13 0720 07/21/13 1010 07/21/13 1508 07/22/13 0400 07/22/13 1424 07/23/13 0403  NA 135 134* 134* 138 141 142 142 143  K 3.4* 3.8 5.8* 5.4* 3.7 3.4* 3.4* 3.1*  CL 98 98 100 102  --  102  --  106  CO2 25 21 15* 16*  --  23  --  26  GLUCOSE 81 60* 98 93  --  92  --  90  BUN 21 23 24* 25*  --  25*  --  30*  CREATININE 0.87 0.87 1.05 1.08  --  1.07  --  1.05  CALCIUM 8.6 8.5 8.9 8.6  --  7.6*  --  7.3*  MG 1.7  --   --   --   --   --   --   --   AST 15 20 802* 2871*  --  6090*  --  2084*  ALT 9 11 338* 1089*  --  1939*  --  1268*  ALKPHOS 177* 152* 179* 122*  --  124*  --  151*  BILITOT 0.3 0.3 0.6 0.6  --  1.3*  --  2.2*   ------------------------------------------------------------------------------------------------------------------ estimated creatinine clearance is 64.8 ml/min (by C-G formula based on Cr of 1.05). ------------------------------------------------------------------------------------------------------------------ No results found for this basename: HGBA1C,  in the last 72 hours ------------------------------------------------------------------------------------------------------------------ No results found for this basename: CHOL, HDL, LDLCALC, TRIG, CHOLHDL,  LDLDIRECT,  in the last 72 hours ------------------------------------------------------------------------------------------------------------------  Recent Labs  07/21/13 1100  TSH 3.028   ------------------------------------------------------------------------------------------------------------------ No  results found for this basename: VITAMINB12, FOLATE, FERRITIN, TIBC, IRON, RETICCTPCT,  in the last 72 hours  Coagulation profile  Recent Labs Lab 07/21/13 1010 07/22/13 0800  INR 2.49* 2.29*    No results found for this basename: DDIMER,  in the last 72 hours  Cardiac Enzymes  Recent Labs Lab 07/21/13 1010  TROPONINI 1.42*   ------------------------------------------------------------------------------------------------------------------ No components found with this basename: POCBNP,      Time Spent in minutes   35   SINGH,PRASHANT K M.D on 07/23/2013 at 3:52 PM  Between 7am to 7pm - Pager - [phone]  After 7pm go to [email or web address] - password [email or web address]  And look for the night coverage person covering for me after hours  [facility] Office  [phone]

## 2013-07-23 NOTE — Progress Notes (Signed)
eLink Physician-Brief Progress Note Patient Name: [name] DOB: [birth year] MRN: [id number]  Date of Service  07/23/2013   HPI/Events of Note  Hypokalemia   eICU Interventions  Potassium replaced   Intervention Category Intermediate Interventions: Electrolyte abnormality - evaluation and management  [name] 07/23/2013, 6:10 AM

## 2013-07-23 NOTE — Progress Notes (Signed)
[name], male   DOB: Apr 11, 1935, 78 y.o.   MRN: [id number] 1 Day Post-Op  Subjective: Pt awake on vent.  Admits to very little pain right now.  RN states that UOP between 20-30cc/hr but dropping off some and becoming more concentrate.  Objective: Vital signs in last 24 hours: Temp:  [96.1 F (35.6 C)-97.8 F (36.6 C)] 97.4 F (36.3 C) (12/10 0759) Pulse Rate:  [65-84] 68 (12/10 0700) Resp:  [12-20] 15 (12/10 0700) BP: (102-121)/(36-54) 107/47 mmHg (12/10 0700) SpO2:  [94 %-100 %] 96 % (12/10 0700) FiO2 (%):  [50 %] 50 % (12/10 0400) Weight:  [168 lb 14 oz (76.6 kg)] 168 lb 14 oz (76.6 kg) (12/10 0500) Last BM Date: 07/18/13  Intake/Output from previous day: 12/09 0701 - 12/10 0700 In: 5057.5 [I.V.:3027.5; Blood:1000; NG/GT:30; IV Piggyback:1000] Out: 945 [Urine:695; Emesis/NG output:250] Intake/Output this shift:    PE: Abd: soft, more tender on left side than right, absent BS, ND, incision c/d/i with staples.  OG tube with bilious output.  Lab Results:   Recent Labs  07/22/13 0400 07/22/13 1424 07/23/13 0403  WBC 7.6  --  12.5*  HGB 8.0* 7.1* 9.0*  HCT 23.1* 21.0* 26.9*  PLT 91*  --  54*   BMET  Recent Labs  07/22/13 0400 07/22/13 1424 07/23/13 0403  NA 142 142 143  K 3.4* 3.4* 3.1*  CL 102  --  106  CO2 23  --  26  GLUCOSE 92  --  90  BUN 25*  --  30*  CREATININE 1.07  --  1.05  CALCIUM 7.6*  --  7.3*   PT/INR  Recent Labs  07/21/13 1010 07/22/13 0800  LABPROT 26.1* 24.5*  INR 2.49* 2.29*   CMP     Component Value Date/Time   NA 143 07/23/2013 0403   K 3.1* 07/23/2013 0403   CL 106 07/23/2013 0403   CO2 26 07/23/2013 0403   GLUCOSE 90 07/23/2013 0403   BUN 30* 07/23/2013 0403   CREATININE 1.05 07/23/2013 0403   CALCIUM 7.3* 07/23/2013 0403   PROT 4.1* 07/23/2013 0403   ALBUMIN 1.8* 07/23/2013 0403   AST 2084* 07/23/2013 0403   ALT 1268* 07/23/2013 0403   ALKPHOS 151* 07/23/2013 0403   BILITOT 2.2* 07/23/2013 0403    GFRNONAA 67* 07/23/2013 0403   GFRAA 78* 07/23/2013 0403   Lipase     Component Value Date/Time   LIPASE 11 07/21/2013 1010       Studies/Results: Dg Chest Port 1 View  07/22/2013   CLINICAL DATA:  Status post laparotomy and small bowel resection for bowel infarction.  EXAM: PORTABLE CHEST - 1 VIEW  COMPARISON:  07/21/2013.  FINDINGS: Endotracheal tube present with the tip approximately 5 cm above the carina. Central line tip lies in the upper SVC. Lungs show stable chronic disease without evidence of overt edema or airspace consolidation. The heart size is stable and within normal limits.  IMPRESSION: Stable chronic lung disease.   Electronically Signed   By: Irish Lack M.D.   On: 07/22/2013 10:41   Dg Chest Port 1 View  07/21/2013   CLINICAL DATA:  Endotracheal tube and line placement  EXAM: PORTABLE CHEST - 1 VIEW  COMPARISON:  07/18/2013  FINDINGS: Endotracheal tube tip lies 1.5 cm above the carina. A left internal jugular central venous line tip lies in the mid superior vena cava. A nasogastric tube has also been placed. Its tip lies at the GE junction.  It will need to be further inserted to allow the tip to fully into the stomach.  No pneumothorax evident on this semi-erect study.  Lung hyperexpansion, chronic interstitial thickening and areas of lung scarring and probable basilar subsegmental atelectasis are stable. No area of focal consolidation and no overt pulmonary edema.  IMPRESSION: 1. Endotracheal tube and left internal jugular central venous line are well positioned. No pneumothorax. 2. Nasogastric tube tip lies at the GE junction. Recommend further inserting tube 10-15 cm to allow the tip to fully into the stomach. 3. No change in the appearance of the lungs when allowing for differences in technique and patient positioning.   Electronically Signed   By: Amie Portland M.D.   On: 07/21/2013 10:39    Anti-infectives: Anti-infectives   Start     Dose/Rate Route Frequency  Ordered Stop   07/22/13 2200  vancomycin (VANCOCIN) IVPB 1000 mg/200 mL premix     1,000 mg 200 mL/hr over 60 Minutes Intravenous Every 12 hours 07/22/13 2056     07/21/13 1000  fluconazole (DIFLUCAN) IVPB 200 mg     200 mg 100 mL/hr over 60 Minutes Intravenous Every 24 hours 07/21/13 0944     07/21/13 0945  metroNIDAZOLE (FLAGYL) IVPB 500 mg     500 mg 100 mL/hr over 60 Minutes Intravenous Every 8 hours 07/21/13 0933     07/21/13 0945  piperacillin-tazobactam (ZOSYN) IVPB 3.375 g     3.375 g 12.5 mL/hr over 240 Minutes Intravenous 3 times per day 07/21/13 0944     07/21/13 0800  vancomycin (VANCOCIN) IVPB 1000 mg/200 mL premix  Status:  Discontinued     1,000 mg 200 mL/hr over 60 Minutes Intravenous Every 12 hours 07/21/13 0626 07/22/13 1045   07/21/13 0615  ceFEPIme (MAXIPIME) 1 g in dextrose 5 % 50 mL IVPB  Status:  Discontinued     1 g 100 mL/hr over 30 Minutes Intravenous 3 times per day 07/21/13 0613 07/21/13 0919       Assessment/Plan  1. POD 2, 1 ex lap with SBR x2 due to ischemic bowel 2. VDRF, on vent 3. Shock liver, labs improved, but TB up mildly 4. Horrible vascular disease 5. SPCM 6. Thrombocytopenia  Plan: 1. Remarkably, the patient looks pretty well today; however, I think his overall prognosis is still quite grim given his multiple comorbidities and severe short gut syndrome. 2. Patient will need NGT instead of OGT if he is able to extubate as he will likely have an ileus given all he has had done. 3. Suspect TNA would be appropriate in this patient given Short gut and SPCM with a prealbumin of 4.5.  4. The patient has the bare minimum amount of small bowel someone may be able to survive.  If he has any other issues with leaking or ischemia, there is nothing further we can do surgically.  This has been relayed to the family as well. 5. Cont to follow. 6. Family asked yesterday about placing the patient back on his blood thinners to possible help with blood flow.   The patient had been taken off of his blood thinners due to multiple GI bleeds in the last month requiring hospitalizations.  He has 3 large antral ulcers.  Not sure if his blood thinners are indicated due to this risk for GI bleeding.  The patient has additionally, required blood transfusions after both surgeries due to low hgbs.  Will defer this decision to CCM.    LOS: 5 days  Maisen Klingler E 07/23/2013, 8:08 AM Pager: [phone]

## 2013-07-23 NOTE — Progress Notes (Signed)
[facility] ADULT ICU REPLACEMENT PROTOCOL FOR AM LAB REPLACEMENT ONLY  The patient does not apply for the [facility] Adult ICU Electrolyte Replacment Protocol based on the criteria listed below:   1. Is GFR >/= 40 ml/min? yes  Patient's GFR today is 67 2. Is urine output >/= 0.5 ml/kg/hr for the last 6 hours? no Patient's UOP is 0.36 ml/kg/hr 3. Is BUN < 60 mg/dL? yes  Patient's BUN today is 30 4. Abnormal electrolyte(s): K 3.1 5. Ordered repletion with: NA 6. If a panic level lab has been reported, has the CCM MD in charge been notified? yes.   Physician:  Dr Pedro Earls, Kylina Vultaggio A 07/23/2013 5:15 AM

## 2013-07-23 NOTE — Progress Notes (Signed)
General Surgery [facility]  Patient seen and examined in ICU.  Appropriately responsive to voice.  Abdomen soft without distension, dressing dry, diffusely tender to palpation.  Overall clinically improving.  As noted by Barnetta Chapel, we have resected the maximal amount of ischemic and infarcted bowel, leaving slight more than 100 cm of jejunum and ileum.  No further operative interventions on the abdomen are planned.  If anticoagulation is desired, it would be safe to do so from surgical standpoint at this time.  Will follow with you.  Management per CCM and medical service.  Velora Heckler, MD, [facility] Office: [phone]

## 2013-07-23 NOTE — Op Note (Signed)
NAMEVENCENT, [name] NO.:  [id number]  MEDICAL RECORD NO.:  [id number]  LOCATION:  2S11C                        FACILITY:  [facility]  PHYSICIAN:  Velora Heckler, MD      DATE OF BIRTH:  1934/11/05  DATE OF PROCEDURE:  07/22/2013                              OPERATIVE REPORT   PREOPERATIVE DIAGNOSIS:  Intestinal ischemia and infarction.  POSTOPERATIVE DIAGNOSIS:  Intestinal ischemia and infarction.  PROCEDURE: 1. Exploratory laparotomy. 2. Segmental small bowel resection terminal ileum. 3. Two primary anastomoses to reestablish intestinal continuity.  SURGEON:  Velora Heckler, MD, FACS  ASSISTANT:  Letha Cape, [place]  ANESTHESIA:  General per Dr. Kipp Brood.  ESTIMATED BLOOD LOSS:  Minimal.  PREPARATION:  Betadine.  COMPLICATIONS:  None.  INDICATIONS:  The patient is a 78 year old male who underwent exploratory laparotomy on July 21, 2013, for intestinal ischemia and infarction.  He underwent a massive antrectomy leaving a short segment of proximal jejunum and a distal segment of mid and terminal ileum. This is a scheduled second-look laparotomy to evaluate the remaining intestine and to reestablish intestinal continuity.  BODY OF REPORT:  Procedure was done in OR #1 at the [facility].  The patient was brought to the operating room, placed in supine position on the operating room table.  Following administration of general anesthesia, the patient was positioned and then prepped and draped in the usual aseptic fashion.  After ascertaining that an adequate level of anesthesia had been achieved, the surgical clips were removed from the midline incision.  Suture material was extracted.  Peritoneal cavity was entered cautiously.  There was approximately 300 mL of yellow ascitic fluid within the abdomen which was evacuated.  The stomach appeared viable.  Duodenum appeared viable. Proximal jejunum was somewhat dusky but not infarcted.   Colon appeared viable.  Distal small bowel appeared viable with the exception of a short 10-cm segment in the distal ileum which showed transmural necrosis.  A decision was made to proceed with a short segmental resection and then 2 anastomoses in order to reestablish intestinal continuity.  Therefore, the infarcted segment in the distal ileum was mobilized.  The bowel was transected proximal and distal to the area of infarction with a GIA stapler.  Mesentery was divided between [facility] clamps, and ligated with 2-0 silk ties. A side-to-side anastomosis was created using a GIA stapler.  Staple line was inspected for hemostasis.  Enterotomy was closed with a TA 60 stapler.  The anastomosis appeared widely patent.  A second anastomosis was then created between the mid ileum and the proximal jejunum.  This was performed as a side-to-side anastomosis using the GIA stapler.  Staple lines were inspected for hemostasis. Enterotomy was closed with a TA 60 stapler.  Anastomosis appeared to be widely patent.  Good hemostasis was noted.  Abdomen was irrigated with warm saline which was evacuated.  Good hemostasis was noted throughout.  Midline incision was closed with interrupted #1 Novafil simple sutures. Skin was closed with stainless steel staples.  The patient was transferred intubated from the operating room back to the intensive care unit in critical condition.  The patient tolerated the procedure without adverse event.   Velora Heckler, MD, [facility] Office: [phone]    TMG/MEDQ  D:  07/22/2013  T:  07/23/2013  Job:  [id number]  cc:   Larina Earthly, M.D.

## 2013-07-24 ENCOUNTER — Encounter (HOSPITAL_COMMUNITY): Payer: Self-pay | Admitting: Surgery

## 2013-07-24 ENCOUNTER — Inpatient Hospital Stay (HOSPITAL_COMMUNITY): Payer: Medicare Other

## 2013-07-24 DIAGNOSIS — K55059 Acute (reversible) ischemia of intestine, part and extent unspecified: Secondary | ICD-10-CM

## 2013-07-24 DIAGNOSIS — I5020 Unspecified systolic (congestive) heart failure: Secondary | ICD-10-CM

## 2013-07-24 LAB — DIFFERENTIAL
Basophils Absolute: 0.3 10*3/uL — ABNORMAL HIGH (ref 0.0–0.1)
Basophils Relative: 3 % — ABNORMAL HIGH (ref 0–1)
Lymphocytes Relative: 16 % (ref 12–46)
Monocytes Relative: 9 % (ref 3–12)
Neutro Abs: 7 10*3/uL (ref 1.7–7.7)
Neutrophils Relative %: 71 % (ref 43–77)

## 2013-07-24 LAB — TROPONIN I: Troponin I: 1.54 ng/mL (ref <0.30)

## 2013-07-24 LAB — PHOSPHORUS: Phosphorus: 2.1 mg/dL — ABNORMAL LOW (ref 2.3–4.6)

## 2013-07-24 LAB — POCT I-STAT 3, ART BLOOD GAS (G3+)
Acid-Base Excess: 1 mmol/L (ref 0.0–2.0)
Bicarbonate: 24.9 mEq/L — ABNORMAL HIGH (ref 20.0–24.0)
O2 Saturation: 88 %
Patient temperature: 98
TCO2: 26 mmol/L (ref 0–100)
TCO2: 26 mmol/L (ref 0–100)
pCO2 arterial: 34.8 mmHg — ABNORMAL LOW (ref 35.0–45.0)
pCO2 arterial: 42 mmHg (ref 35.0–45.0)
pH, Arterial: 7.383 (ref 7.350–7.450)
pO2, Arterial: 51 mmHg — ABNORMAL LOW (ref 80.0–100.0)
pO2, Arterial: 169 mmHg — ABNORMAL HIGH (ref 80.0–100.0)

## 2013-07-24 LAB — GLUCOSE, CAPILLARY
Glucose-Capillary: 115 mg/dL — ABNORMAL HIGH (ref 70–99)
Glucose-Capillary: 120 mg/dL — ABNORMAL HIGH (ref 70–99)
Glucose-Capillary: 171 mg/dL — ABNORMAL HIGH (ref 70–99)

## 2013-07-24 LAB — CBC
HCT: 26 % — ABNORMAL LOW (ref 39.0–52.0)
Hemoglobin: 8.8 g/dL — ABNORMAL LOW (ref 13.0–17.0)
RDW: 17.6 % — ABNORMAL HIGH (ref 11.5–15.5)
WBC: 9.9 10*3/uL (ref 4.0–10.5)

## 2013-07-24 LAB — CULTURE, RESPIRATORY

## 2013-07-24 LAB — COMPREHENSIVE METABOLIC PANEL
ALT: 903 U/L — ABNORMAL HIGH (ref 0–53)
AST: 818 U/L — ABNORMAL HIGH (ref 0–37)
Albumin: 1.5 g/dL — ABNORMAL LOW (ref 3.5–5.2)
CO2: 25 mEq/L (ref 19–32)
Chloride: 110 mEq/L (ref 96–112)
GFR calc non Af Amer: 65 mL/min — ABNORMAL LOW (ref >90)
Potassium: 3.2 mEq/L — ABNORMAL LOW (ref 3.5–5.1)
Sodium: 143 mEq/L (ref 135–145)
Total Bilirubin: 3.3 mg/dL — ABNORMAL HIGH (ref 0.3–1.2)

## 2013-07-24 LAB — PREALBUMIN: Prealbumin: <3 mg/dL — ABNORMAL LOW (ref 17.0–34.0)

## 2013-07-24 LAB — CULTURE, RESPIRATORY W GRAM STAIN

## 2013-07-24 LAB — TRIGLYCERIDES: Triglycerides: 210 mg/dL — ABNORMAL HIGH (ref <150)

## 2013-07-24 MED ORDER — FAT EMULSION 20 % IV EMUL
240.0000 mL | INTRAVENOUS | Status: AC
  Administered 2013-07-24: 240 mL via INTRAVENOUS
  Filled 2013-07-24: qty 250

## 2013-07-24 MED ORDER — TRACE MINERALS CR-CU-F-FE-I-MN-MO-SE-ZN IV SOLN
INTRAVENOUS | Status: AC
  Administered 2013-07-24: 18:00:00 via INTRAVENOUS
  Filled 2013-07-24: qty 1000

## 2013-07-24 MED ORDER — POTASSIUM PHOSPHATE DIBASIC 3 MMOLE/ML IV SOLN
30.0000 mmol | Freq: Once | INTRAVENOUS | Status: AC
  Administered 2013-07-24: 30 mmol via INTRAVENOUS
  Filled 2013-07-24: qty 10

## 2013-07-24 NOTE — Progress Notes (Signed)
eLink Physician-Brief Progress Note Patient Name: FESTUS PURSEL DOB: Feb 28, 1935 MRN: [id number]  Date of Service  07/24/2013   HPI/Events of Note  Hypophosphatemia and hypokalemia   eICU Interventions  Potassium and phos replaced   Intervention Category Intermediate Interventions: Electrolyte abnormality - evaluation and management  Isham Smitherman 07/24/2013, 4:44 AM

## 2013-07-24 NOTE — Progress Notes (Signed)
ET tube pulled back 2cm per MD order. ET tube is secured at 24, bbs heard. RT will continue to monitor.

## 2013-07-24 NOTE — Progress Notes (Signed)
Troponin-2.22 called to MD Alva, Lactic acid-1.5, Levophed restarted-Aline BP 83/50s, map-52-57. 12Lead EKG ordered. Troponin ordered for am. Will cont to monitor pt.  Lashauna Arpin L

## 2013-07-24 NOTE — Progress Notes (Signed)
Patient noted to have increasing bilateral rhonchi with copious thick white secretions and oxygen saturations in the 80's.  Stat chest x-ray and ABG obtained, E-link MD notified, and orders received for vent changes.  Patient has subsequently required recruitment maneuversx2 and frequent suctioning to maintain adequate oxygen saturations.

## 2013-07-24 NOTE — Progress Notes (Signed)
Subjective: Interval History: none.[name] awake on the vent. Denies any pain. When asked specifically about his right foot he says he does have some discomfort in his right foot  Objective: Vital signs in last 24 hours: Temp:  [97.4 F (36.3 C)-98.5 F (36.9 C)] 98.2 F (36.8 C) (12/11 0400) Pulse Rate:  [63-85] 77 (12/11 0700) Resp:  [9-27] 9 (12/11 0700) BP: (89-126)/(39-73) 92/45 mmHg (12/11 0700) SpO2:  [88 %-97 %] 97 % (12/11 0700) FiO2 (%):  [40 %-80 %] 80 % (12/11 0606) Weight:  [173 lb 1 oz (78.5 kg)] 173 lb 1 oz (78.5 kg) (12/11 0500)  Intake/Output from previous day: 12/10 0701 - 12/11 0700 In: 5399.8 [I.V.:2989; NG/GT:120; IV Piggyback:1747.5; TPN:543.3] Out: 1115 [Urine:715; Emesis/NG output:400] Intake/Output this shift:    Abdomen soft, I do not palpate a right femoral pulse. Does have palpable left femoral pulse. Right foot is cooler than left. He does have motor function in his right foot and the marked end diminished sensory function in his right foot  Lab Results:  Recent Labs  07/23/13 0403 07/24/13 0346  WBC 12.5* 9.9  HGB 9.0* 8.8*  HCT 26.9* 26.0*  PLT 54* 42*   BMET  Recent Labs  07/23/13 0403 07/24/13 0346  NA 143 143  K 3.1* 3.2*  CL 106 110  CO2 26 25  GLUCOSE 90 125*  BUN 30* 33*  CREATININE 1.05 1.08  CALCIUM 7.3* 7.2*    Studies/Results: Dg Chest 2 View  07/06/2013   CLINICAL DATA:  Epigastric pain for 1 week, low blood pressure  EXAM: CHEST  2 VIEW  COMPARISON:  06/25/2013  FINDINGS: Heart size mildly enlarged. Heavy aortic arch calcification. Vascular pattern normal. Mild diffuse interstitial change most prominent in the right middle lobe and left base. Unchanged appearance when compared to prior study, except for mild decrease in the overall prominence of interstitial change. Right internal jugular catheter has been removed.  IMPRESSION: Decreased interstitial conspicuity suggesting resolving interstitial pulmonary edema.    Electronically Signed   By: Esperanza Heir M.D.   On: 07/06/2013 18:50   Dg Abd 1 View  07/07/2013   CLINICAL DATA:  Increased abdominal pain, evaluate for ileus  EXAM: ABDOMEN - 1 VIEW  COMPARISON:  06/17/2013  FINDINGS: Mild nonspecific gas distention of the bowel. Negative for significant obstruction or ileus. Extensive aortoiliac calcifications noted. Bilateral renal and common iliac stents present. No acute osseous finding.  IMPRESSION: Negative for significant obstruction or ileus.   Electronically Signed   By: Ruel Favors M.D.   On: 07/07/2013 08:19   US Abdomen Complete  07/08/2013   CLINICAL DATA:  Nausea.  Abdominal pain.  EXAM: ULTRASOUND ABDOMEN COMPLETE  COMPARISON:  None.  FINDINGS: Gallbladder  Several small less than 1 cm gallstones noted as well as minimal amount of sludge. No evidence of gallbladder wall thickening. No sonographic Murphy sign noted.  Common bile duct  Diameter: 4 mm  Liver  No focal lesion identified. Within normal limits in parenchymal echogenicity.  IVC  No abnormality visualized.  Pancreas  Visualized portion unremarkable.  Spleen  Size and appearance within normal limits.  Right Kidney  Length: 11.0 cm. Echogenicity within normal limits. No mass or hydronephrosis visualized.  Left Kidney  Length: 12.4 cm. Echogenicity within normal limits. No mass or hydronephrosis visualized.  Abdominal aorta  No aneurysm visualized.  IMPRESSION: Cholelithiasis. No sonographic signs of cholecystitis, biliary dilatation, or other acute findings.   Electronically Signed   By: Jonny Ruiz  Eppie Gibson M.D.   On: 07/08/2013 20:26   Ct Abdomen Pelvis W Contrast  07/18/2013   CLINICAL DATA:  Vomiting. Recently discharged after being admitted for peptic ulcers.  EXAM: CT ABDOMEN AND PELVIS WITH CONTRAST  TECHNIQUE: Multidetector CT imaging of the abdomen and pelvis was performed using the standard protocol following bolus administration of intravenous contrast.  CONTRAST:  OMNIPAQUE IOHEXOL  300 MG/ML  SOLN  COMPARISON:  06/17/2013.  FINDINGS: Multiple sigmoid colon diverticula are again demonstrated without evidence of diverticulitis. Multiple normal caliber fluid-filled loops of small bowel. Diffuse low density wall thickening in the duodenal bulb, measuring 1.2 cm in thickness on image number 26. This is producing moderate luminal narrowing. Tiny gallstones in the gallbladder. The largest measures 4 mm in maximum diameter. No gallbladder wall thickening or pericholecystic fluid.  Inhomogeneous enhancement of the liver and spleen with no discrete liver mass seen. 2.6 x 2.1 cm left adrenal mass with low density components. This was previously shown to measure 5 Hounsfield units without intravenous contrast, compatible with a benign adenoma. Small left renal cysts. Unremarkable right kidney, urinary bladder and prostate gland. No enlarged lymph nodes. No evidence of appendicitis. Dense atheromatous arterial calcifications. Bilateral renal artery stents. The no significant change in mild bibasilar atelectasis/scarring. Mild lumbar lower thoracic spine degenerative changes.  IMPRESSION: 1. Do tonight is involving the duodenal bulb with moderate luminal narrowing. 2. Normal caliber fluid-filled small bowel loops. This can be seen with gastroenteritis. 3. Cholelithiasis without evidence of cholecystitis. 4. Extensive arterial calcifications with bilateral renal artery stents. 5. Sigmoid diverticulosis. 6. Stable left adrenal adenoma.   Electronically Signed   By: Gordan Payment M.D.   On: 07/18/2013 18:27   US Renal  07/07/2013   CLINICAL DATA:  Renal failure  EXAM: RENAL/URINARY TRACT ULTRASOUND COMPLETE  COMPARISON:  CT abdomen and pelvis 06/17/2013  FINDINGS: Right Kidney  Length: 10.3 cm. Normal cortical thickness and echogenicity for age. . 3 mm echogenic non shadowing focus mid right kidney corresponding the calculus on prior CT. No mass or hydronephrosis.  Left Kidney  Length: 11.5 cm. Normal  cortical thickness and echogenicity for age. No mass, hydronephrosis or shadowing calcification.  Bladder  Appears normal for degree of bladder distention.  IMPRESSION: 3 mm nonobstructing mid right renal calculus.  Otherwise negative exam.   Electronically Signed   By: Ulyses Southward M.D.   On: 07/07/2013 08:48   Nm Hepato W/eject Fract  07/11/2013   CLINICAL DATA:  Pain.  Nausea and vomiting.  EXAM: NUCLEAR MEDICINE HEPATOBILIARY IMAGING WITH GALLBLADDER EF  TECHNIQUE: Sequential images of the abdomen were obtained out to 60 minutes following intravenous administration of radiopharmaceutical. After slow intravenous infusion of 1.67 micrograms Cholecystokinin, gallbladder ejection fraction was determined.  COMPARISON:  Ultrasound 07/08/2013.  RADIOPHARMACEUTICALS:  98mCiTc-57m Choletec  FINDINGS: Liver, gallbladder, biliary system, and bowel appear normal. At 30 min, normal ejection fraction is greater than 30%.  The patient did not experience symptoms during CCK infusion.  IMPRESSION: Normal exam.   Electronically Signed   By: Maisie Fus  Register   On: 07/11/2013 12:50   Dg Chest Port 1 View  07/24/2013   CLINICAL DATA:  Endotracheal tube position. Decreased oxygen saturations.  EXAM: PORTABLE CHEST - 1 VIEW  COMPARISON:  07/23/2013  FINDINGS: Endotracheal tube is in place with tip likely just above the carina. Consider withdrawing endotracheal tube approximately 2 cm. Nasogastric tube is in place with tip off the film but beyond the lower esophagus. Left IJ central  line tip overlies the level of the superior vena cava. Bilateral lower lobe infiltrates/effusions again noted.  IMPRESSION: 1. Endotracheal tube tip just above the carina. 2. Bilateral lower lobe opacities, similar in appearance to prior study. Findings are consistent with infiltrates and effusions.   Electronically Signed   By: Rosalie Gums M.D.   On: 07/24/2013 01:57   Dg Chest Port 1 View  07/23/2013   CLINICAL DATA:  Ventilated patient.   History of abdominal surgery.  EXAM: PORTABLE CHEST - 1 VIEW  COMPARISON:  07/22/2013  FINDINGS: Endotracheal tube is 4.9 cm above the carina. Jugular central venous catheter in the upper SVC region. Nasogastric tube extends into the abdomen. Increased densities in the right lower chest could represent developing airspace disease or asymmetric edema. Slightly increased densities at the left lung base suggest atelectasis and suspect small pleural effusions. Heart size is stable. No evidence for a pneumothorax.  IMPRESSION: Increased densities in the right lower chest are concerning for developing airspace disease or asymmetric edema.  Probable small pleural effusions.   Electronically Signed   By: Richarda Overlie M.D.   On: 07/23/2013 08:08   Dg Chest Port 1 View  07/22/2013   CLINICAL DATA:  Status post laparotomy and small bowel resection for bowel infarction.  EXAM: PORTABLE CHEST - 1 VIEW  COMPARISON:  07/21/2013.  FINDINGS: Endotracheal tube present with the tip approximately 5 cm above the carina. Central line tip lies in the upper SVC. Lungs show stable chronic disease without evidence of overt edema or airspace consolidation. The heart size is stable and within normal limits.  IMPRESSION: Stable chronic lung disease.   Electronically Signed   By: Irish Lack M.D.   On: 07/22/2013 10:41   Dg Chest Port 1 View  07/21/2013   CLINICAL DATA:  Endotracheal tube and line placement  EXAM: PORTABLE CHEST - 1 VIEW  COMPARISON:  07/18/2013  FINDINGS: Endotracheal tube tip lies 1.5 cm above the carina. A left internal jugular central venous line tip lies in the mid superior vena cava. A nasogastric tube has also been placed. Its tip lies at the GE junction. It will need to be further inserted to allow the tip to fully into the stomach.  No pneumothorax evident on this semi-erect study.  Lung hyperexpansion, chronic interstitial thickening and areas of lung scarring and probable basilar subsegmental atelectasis are  stable. No area of focal consolidation and no overt pulmonary edema.  IMPRESSION: 1. Endotracheal tube and left internal jugular central venous line are well positioned. No pneumothorax. 2. Nasogastric tube tip lies at the GE junction. Recommend further inserting tube 10-15 cm to allow the tip to fully into the stomach. 3. No change in the appearance of the lungs when allowing for differences in technique and patient positioning.   Electronically Signed   By: Amie Portland M.D.   On: 07/21/2013 10:39   Dg Chest Port 1 View  07/18/2013   CLINICAL DATA:  Vomiting.  Smoker.  EXAM: PORTABLE CHEST - 1 VIEW  COMPARISON:  07/06/2013.  FINDINGS: Normal sized heart. Post CABG changes. Decreased inspiration with minimal bibasilar atelectasis. Atheromatous arterial calcifications. Stable right basilar scarring. Stable prominence of the interstitial markings.  IMPRESSION: 1. Poor inspiration with minimal bibasilar atelectasis. 2. Stable changes of COPD with right basilar scarring.   Electronically Signed   By: Gordan Payment M.D.   On: 07/18/2013 14:28   Dg Chest Port 1 View  06/25/2013   CLINICAL DATA:  CHF.  EXAM: PORTABLE CHEST - 1 VIEW  COMPARISON:  June 22, 2013.  FINDINGS: The lungs are well expanded. The interstitial markings remain diffusely increased. The left hemidiaphragm is slightly better demonstrated today. Persistent fibrotic type density is present in the lower 1/3 of the right lung. The cardiac silhouette remains top-normal in size. The pulmonary vascularity is mildly prominent centrally. The patient has undergone previous CABG. There is calcification in the wall of the tortuous descending thoracic aorta. The right internal jugular venous catheter tip lies in the region of the distal portion of the internal jugular vein and appears to been withdrawn somewhat since the previous study.  IMPRESSION: 1. There has not been significant interval change in the appearance of the pulmonary interstitium or cardiac  silhouette since the previous study. The left hemidiaphragm is slightly better demonstrated today and hazy density in the right mid and lower lung is somewhat less conspicuous which may reflect some resolving atelectasis or interstitial edema. 2. The right internal jugular venous catheter appears to brisk been withdrawn somewhat such that its tip now arise in the region of the distal portion of the right internal jugular vein.   Electronically Signed   By: David  [place]   On: 06/25/2013 08:38   Ct Angio Abd/pel W/ And/or W/o  07/21/2013   CLINICAL DATA:  Epigastric abdominal pain, nausea, vomiting and weight loss. Endoscopy on 12/07 demonstrates severe erosive ulcerative gastritis as well as duodenitis and jejunitis.  EXAM: CT ANGIOGRAPHY ABDOMEN AND PELVIS WITH CONTRAST AND WITHOUT CONTRAST  TECHNIQUE: Multidetector CT imaging of the abdomen and pelvis was performed using the standard protocol during bolus administration of intravenous contrast. Multiplanar reconstructed images including MIPs were obtained and reviewed to evaluate the vascular anatomy.  CONTRAST:  OMNIPAQUE IOHEXOL 350 MG/ML SOLN  COMPARISON:  Standard CT of the abdomen and pelvis with contrast on 07/18/2013.  FINDINGS: The aorta and major visceral branches are heavily calcified with diffuse atherosclerosis present. The origin of the celiac axis shows critical narrowing and likely near subtotal occlusive disease. Distal branches are opacified and the celiac trunk is likely not completely occluded.  The proximal superior mesenteric artery shows heavily calcified plaque extending for a long distance down the trunk of the SMA. The proximal SMA trunk is completely occluded with reconstitution more distally by jejunal branches related to collateral reconstitution from celiac and IMA supplied. The inferior mesenteric artery origin is heavily calcified but open.  Bilateral renal artery stents are identified which are open. Bilateral common iliac  artery stents are also present which are open. The external iliac arteries are diffusely diseased with heavily calcified plaque.  There is likely critical stenosis and near subtotal occlusion at the level of the mid right external iliac artery. Moderate narrowing of the distal external iliac artery is also present just above the inguinal ligament approaching 70-75% narrowing. At the level of the right groin, postsurgical changes are seen likely related to prior femoral bypass with an occluded proximal bypass graft present. The native SFA is also occluded at its origin.  Diffuse disease of the left external iliac artery present without significant stenosis. The common femoral artery shows irregular plaque causing approximately 50% narrowing just below the inguinal ligament. Postsurgical changes are seen in the left groin with an open proximal bypass graft identified in the anterior thigh. The native SFA is occluded at its origin.  Nonvascular evaluation shows significant interval distension of small bowel loops which are diffusely dilated and fluid-filled. There may be some pneumatosis  in the transverse duodenum and proximal jejunum as well, concerning for potential evolution of bowel ischemia to necrosis. The colon is completely decompressed. There is no evidence of free intraperitoneal air or focal abscess. The gallbladder shows some increased distention since the prior study. There is no evidence of overt biliary obstruction, however. Stable left adrenal mass identified. This is fairly low density and likely an incidental adenoma.  Review of the MIP images confirms the above findings.  IMPRESSION: 1. 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 2. 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 3. Heavily calcified but open inferior mesenteric  artery. 4. 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 Postsurgical changes are evident in the right groin with occlusion of a previously placed proximal bypass graft. 5. Open left femoral bypass graft. 6. Significant interval distension of small bowel loops which are diffusely dilated and filled with fluid. There also may be a pneumatosis in the duodenum and proximal jejunum concerning for severe ischemia/necrosis. Surgical consultation would be recommended. Critical Value/emergent results were called by telephone at the time of interpretation on 07/21/2013 at 8:00 AM to Dr.Prashant Thedore Mins, who verbally acknowledged these results.   Electronically Signed   By: Irish Lack M.D.   On: 07/21/2013 08:35   Anti-infectives: Anti-infectives   Start     Dose/Rate Route Frequency Ordered Stop   07/22/13 2200  vancomycin (VANCOCIN) IVPB 1000 mg/200 mL premix     1,000 mg 200 mL/hr over 60 Minutes Intravenous Every 12 hours 07/22/13 2056     07/21/13 1000  fluconazole (DIFLUCAN) IVPB 200 mg     200 mg 100 mL/hr over 60 Minutes Intravenous Every 24 hours 07/21/13 0944     07/21/13 0945  metroNIDAZOLE (FLAGYL) IVPB 500 mg     500 mg 100 mL/hr over 60 Minutes Intravenous Every 8 hours 07/21/13 0933     07/21/13 0945  piperacillin-tazobactam (ZOSYN) IVPB 3.375 g     3.375 g 12.5 mL/hr over 240 Minutes Intravenous 3 times per day 07/21/13 0944     07/21/13 0800  vancomycin (VANCOCIN) IVPB 1000 mg/200 mL premix  Status:  Discontinued     1,000 mg 200 mL/hr over 60 Minutes Intravenous Every 12 hours 07/21/13 0626 07/22/13 1045   07/21/13 0615  ceFEPIme (MAXIPIME) 1 g in dextrose 5 % 50 mL IVPB  Status:  Discontinued     1 g 100 mL/hr over 30 Minutes Intravenous 3 times per day 07/21/13 1478 07/21/13 0919      Assessment/Plan: s/p Procedure(s): EXPLORATORY LAPAROTOMY, SMALL BOWEL RESECTION (N/A) Remains critically ill. Currently stable from a GI standpoint. Continued ischemia with his right  foot. Suspect related to diagnostic arteriogram. Will discuss with family today. If remains stable would return to the operating room tomorrow for right groin exploration. Clearly not a candidate for bypass but may be able to improve flow to his foot via right femoral exploration and endarterectomy   LOS: 6 days   Debbera Wolken 07/24/2013, 7:46 AM

## 2013-07-24 NOTE — Progress Notes (Addendum)
PULMONARY  / CRITICAL CARE MEDICINE  Name: [name] MRN: [id number] DOB: 1935/07/21    ADMISSION DATE:  07/18/2013 CONSULTATION DATE: 12-8  REFERRING MD :  [facility] PRIMARY SERVICE: PCCM  CHIEF COMPLAINT:  ARF  BRIEF PATIENT DESCRIPTION:  78 yo male with with a plethora of health issues admitted with abdominal pain and CT abd demonstrates extensive ischemia changes, small bowel distension and was found to be in shock and respiratory failure and was urgently intubated and transferred to ICU.   SIGNIFICANT EVENTS / STUDIES:  12-8 intuabted, shock, surgical evaluation 12-8 DNR established  12/8 Exploratory laparotomy with entrectomy (majority of jejunum and proximal ileum) 12/9 Segmental small bowel resection terminal ileum  Two anastomoses to re-establish intestinal continuity 12/11 requires higher PEEP/FIO2    LINES / TUBES: 12- 8 OTT>> 12-8 Rt fem cvl>>12-8 12-8 Lt IJ cvl>>  CULTURES: 12-8 bc x 2>>ng 12-8 sputum>>e coli S to ceftx / zosyn 12-8 uc>>  ANTIBIOTICS: 12-8 vanc>> 12-8 diflucan>> 12-8 pip tazo>>  HISTORY OF PRESENT ILLNESS:  78 yr old presented with Nausea and vomiting in adult with evidence of duodenitis on CT scan causing likely due to partial duodenal obstruction on CT scan ( recent Endoscopy 11-26: Three medium sized gastric antral ulcers. Single, large, gastric fundus ulcer , Angiodysplastic lesion in the gastric antrum & Duodenitis) . He had recently been admitted twice in the last 1 month once for atrial fibrillation and norvirus antritis and one week later for antritis and peptic ulcer disease.  Initial CT scan suggested mechanical duodenal obstruction versus duodenal inflammation. Was treated conservatively with bowel rest, PPI, GI was consulted. Underwent EGD on 07/20/2013 with changes were suggestive of severe erosive ulcerative gastritis, duodenitis and jujinitis - subsequently CT angiogram was ordered on 07/20/2013, suggesting that patient had diffuse  calcifications of his mesenteric circulation in that his CT scan look worse for small bowel inflammation/ was suspiciou necrosis now. Found with shock and respiratory failure and was urgently intubated and transferred to ICU.    SUBJECTIVE: requires higher PEEP/FIO2 sedated on fent gtt but arouses easy Afebrile   VITAL SIGNS: Temp:  [97.6 F (36.4 C)-98.5 F (36.9 C)] 98.2 F (36.8 C) (12/11 0400) Pulse Rate:  [63-85] 85 (12/11 0740) Resp:  [9-27] 16 (12/11 0740) BP: (89-126)/(39-73) 92/45 mmHg (12/11 0740) SpO2:  [88 %-100 %] 100 % (12/11 0740) FiO2 (%):  [40 %-80 %] 60 % (12/11 0750) Weight:  [78.5 kg (173 lb 1 oz)] 78.5 kg (173 lb 1 oz) (12/11 0500) HEMODYNAMICS: CVP:  [4 mmHg] 4 mmHg VENTILATOR SETTINGS: Vent Mode:  [-] PRVC FiO2 (%):  [40 %-80 %] 60 % Set Rate:  [15 bmp] 15 bmp Vt Set:  [620 mL] 620 mL PEEP:  [5 cmH20-10 cmH20] 10 cmH20 Pressure Support:  [5 cmH20] 5 cmH20 Plateau Pressure:  [16 cmH20-21 cmH20] 21 cmH20 INTAKE / OUTPUT: Intake/Output     12/10 0701 - 12/11 0700 12/11 0701 - 12/12 0700   I.V. (mL/kg) 2989 (38.1)    Blood     NG/GT 120    IV Piggyback 1747.5    TPN 543.3    Total Intake(mL/kg) 5399.8 (68.8)    Urine (mL/kg/hr) 735 (0.4)    Emesis/NG output 400 (0.2)    Total Output 1135     Net +4264.8            PHYSICAL EXAMINATION: General:  Elderly WM in nodistress and intubated,  Neuro: Follows commands , RASS -1 HEENT: Edentulous, No JVD/LAN  Cardiovascular:  HSR RRR Lungs:  Decreased BS BL Abdomen:  Tense/Tender, OGT with bilious drainage Musculoskeletal:  Wasted musculature Skin:  Rt lower ext  Cool below shin  LABS:  CBC  Recent Labs Lab 07/22/13 0400 07/22/13 1424 07/23/13 0403 07/24/13 0346  WBC 7.6  --  12.5* 9.9  HGB 8.0* 7.1* 9.0* 8.8*  HCT 23.1* 21.0* 26.9* 26.0*  PLT 91*  --  54* 42*   Coag's  Recent Labs Lab 07/21/13 1010 07/22/13 0800 07/24/13 0346  APTT 40* 40*  --   INR 2.49* 2.29* 1.60*    BMET  Recent Labs Lab 07/22/13 0400 07/22/13 1424 07/23/13 0403 07/24/13 0346  NA 142 142 143 143  K 3.4* 3.4* 3.1* 3.2*  CL 102  --  106 110  CO2 23  --  26 25  BUN 25*  --  30* 33*  CREATININE 1.07  --  1.05 1.08  GLUCOSE 92  --  90 125*   Electrolytes  Recent Labs Lab 07/19/13 0400  07/22/13 0400 07/23/13 0403 07/24/13 0346  CALCIUM 8.6  < > 7.6* 7.3* 7.2*  MG 1.7  --   --   --  1.7  PHOS 2.7  --   --   --  2.1*  < > = values in this interval not displayed. Sepsis Markers  Recent Labs Lab 07/18/13 1447 07/18/13 1512 07/21/13 1010  LATICACIDVEN 1.36 1.3 10.5*   ABG  Recent Labs Lab 07/22/13 1424 07/23/13 0409 07/24/13 0129  PHART 7.592* 7.500* 7.461*  PCO2ART 29.3* 35.0 34.8*  PO2ART 70.0* 66.0* 51.0*   Liver Enzymes  Recent Labs Lab 07/22/13 0400 07/23/13 0403 07/24/13 0346  AST 6090* 2084* 818*  ALT 1939* 1268* 903*  ALKPHOS 124* 151* 152*  BILITOT 1.3* 2.2* 3.3*  ALBUMIN 1.9* 1.8* 1.5*   Cardiac Enzymes  Recent Labs Lab 07/18/13 1424 07/21/13 1010  TROPONINI  --  1.42*  PROBNP 4170.0*  --    Glucose  Recent Labs Lab 07/23/13 1157 07/23/13 1640 07/23/13 1946 07/24/13 0035 07/24/13 0342 07/24/13 0801  GLUCAP 76 80 94 120* 119* 115*    Imaging Dg Chest Port 1 View  07/24/2013   CLINICAL DATA:  Endotracheal tube position. Decreased oxygen saturations.  EXAM: PORTABLE CHEST - 1 VIEW  COMPARISON:  07/23/2013  FINDINGS: Endotracheal tube is in place with tip likely just above the carina. Consider withdrawing endotracheal tube approximately 2 cm. Nasogastric tube is in place with tip off the film but beyond the lower esophagus. Left IJ central line tip overlies the level of the superior vena cava. Bilateral lower lobe infiltrates/effusions again noted.  IMPRESSION: 1. Endotracheal tube tip just above the carina. 2. Bilateral lower lobe opacities, similar in appearance to prior study. Findings are consistent with infiltrates and  effusions.   Electronically Signed   By: Rosalie Gums M.D.   On: 07/24/2013 01:57   Dg Chest Port 1 View  07/23/2013   CLINICAL DATA:  Ventilated patient.  History of abdominal surgery.  EXAM: PORTABLE CHEST - 1 VIEW  COMPARISON:  07/22/2013  FINDINGS: Endotracheal tube is 4.9 cm above the carina. Jugular central venous catheter in the upper SVC region. Nasogastric tube extends into the abdomen. Increased densities in the right lower chest could represent developing airspace disease or asymmetric edema. Slightly increased densities at the left lung base suggest atelectasis and suspect small pleural effusions. Heart size is stable. No evidence for a pneumothorax.  IMPRESSION: Increased densities in the right lower chest  are concerning for developing airspace disease or asymmetric edema.  Probable small pleural effusions.   Electronically Signed   By: Richarda Overlie M.D.   On: 07/23/2013 08:08     CXR: 12/11 worse BL infx  ASSESSMENT / PLAN:  PULMONARY A:Acute resp failure- worsening hypoxia/ALI 12/11 G neg- aspn pneumonia COPD with tobacco abuse hx Resp + metab alkalosis P:   Vent bundle BD's Pull ETT back 2 cm Usse PEEP10/FIO2 decrease -Chk ABG now   CARDIOVASCULAR A: Shock presumed sepsis, SIRS response      CAD/ isch cardiomyopathy -EF 35%      Vasculopath -ischemic RLE      NSTEMI P:  Vascular surgery following - femoral endarterectomy being considered Levophed off Chk trop   RENAL A:  Acidosis, hyperkalemia, mild ARF, h/o ras Now Hypokalemic P:   Off bicarb gtt Avoid acei/ contrast  GASTROINTESTINAL A:  SBO/Ischemia s/p extensive SB resection Shock liver H/o antral ulcers P:   CCS following ct TNA  Chk lactate   HEMATOLOGIC A:  Thrombocytopenia Coagulopathy -corrected by vit K P:  scds  Will not anticoagulate - hold plavix for now   INFECTIOUS A:  R/o bactereial transloaction, at risk perf P:   ct zosyn Continue vanc/ diflucan   ENDOCRINE A:   Hypoglycemia, cortisol > 150 P:   SSI Dc stress steroids  NEUROLOGIC A:  Lethargy most likely secondary to metabolic state P:   Daily WUA fent  TODAY'S SUMMARY: Guarded prognosis given extensive bowel ischemia DNR established but ct full medical care   Care during the described time interval was provided by me and/or other providers on the critical care team.  I have reviewed this patient's available data, including medical history, events of note, physical examination and test results as part of my evaluation  CC time x  35 m  ALVA,RAKESH V. [phone]  07/24/2013, 8:33 AM

## 2013-07-24 NOTE — Progress Notes (Signed)
PARENTERAL NUTRITION CONSULT NOTE  Pharmacy Consult for TPN Indication: massive bowel resection  No Known Allergies  Patient Measurements: Height: 6' (182.9 cm) Weight: 173 lb 1 oz (78.5 kg) IBW/kg (Calculated) : 77.6  Vital Signs: Temp: 98.2 F (36.8 C) (12/11 0400) Temp src: Axillary (12/11 0400) BP: 95/44 mmHg (12/11 1000) Pulse Rate: 65 (12/11 1048) Intake/Output from previous day: 12/10 0701 - 12/11 0700 In: 5399.8 [I.V.:2989; NG/GT:120; IV Piggyback:1747.5; TPN:543.3] Out: 1135 [Urine:735; Emesis/NG output:400] Intake/Output from this shift: Total I/O In: 775 [I.V.:375; IV Piggyback:400] Out: 120 [Urine:120]  Labs:  Recent Labs  07/22/13 0400 07/22/13 0800 07/22/13 1424 07/23/13 0403 07/24/13 0346  WBC 7.6  --   --  12.5* 9.9  HGB 8.0*  --  7.1* 9.0* 8.8*  HCT 23.1*  --  21.0* 26.9* 26.0*  PLT 91*  --   --  54* 42*  APTT  --  40*  --   --   --   INR  --  2.29*  --   --  1.60*     Recent Labs  07/22/13 0400 07/22/13 1424 07/23/13 0403 07/24/13 0346  NA 142 142 143 143  K 3.4* 3.4* 3.1* 3.2*  CL 102  --  106 110  CO2 23  --  26 25  GLUCOSE 92  --  90 125*  BUN 25*  --  30* 33*  CREATININE 1.07  --  1.05 1.08  CALCIUM 7.6*  --  7.3* 7.2*  MG  --   --   --  1.7  PHOS  --   --   --  2.1*  PROT 4.3*  --  4.1* 3.6*  ALBUMIN 1.9*  --  1.8* 1.5*  AST 6090*  --  2084* 818*  ALT 1939*  --  1268* 903*  ALKPHOS 124*  --  151* 152*  BILITOT 1.3*  --  2.2* 3.3*  TRIG  --   --   --  210*   Estimated Creatinine Clearance: 63.9 ml/min (by C-G formula based on Cr of 1.08).    Recent Labs  07/24/13 0035 07/24/13 0342 07/24/13 0801  GLUCAP 120* 119* 115*    Insulin Requirements in the past 24 hours:  None; CBGs 76-171  Current Nutrition:  Clinimix E 5/15 at 7mL/hr and lipid 20% emulsion at 65mL/hr- this is providing 36g protein and 1008kcal  Nutritional Goals:  1650-1800 kCal, 110-120 grams of protein per day per RD recommendations  12/10  Admit: 25 YOM admitted with N/V, dehydration, abdominal pain and enteritis d/t norovirus. Found to have mesenteric ischemia and infarcted small bowel. Underwent small bowel resection-majority of jejunum and proximal ileum- currently has minimal amount of small bowel needed to survive. Patient is DNR but continuing full medical care.  GI: 12/7 EGD showed changes suggestive of severe erosive ulcerative gastritis, duodenitis and jujinitis - 12/7 CT angiogram suggested diffuse calcifications of mesenteric circulation; abdomen is soft, more tender on the left than the right; has OG tube- bilious output- about in 24 hours  Endo: A1C 6.1% in Ocotober 2013, TSH normal this admission; was on stress dose steroids- stopped 12/9. CBGs have been at goal  Lytes: Na 143, K 3.2, Phos 2.1- repleted with potassium phosphate this morning. That provided ~48mEq potassium as well. Cl 110, Corrected Ca 9, Mag 1.7  Renal: SCr 1.08, CrCL ~3mL/min; UOP ~0.100mL/kg/hr  Pulm: acute resp failure on vent; 60% FiO2, PEEP 10  Cards: chronic vascular disease- more flow in left foot than  right which is baseline. Shock from presumed sepsis, NSTEMI-  requiring Levophed, currently running at 42mcg/min. BP 95/44, HR 60-80s. Hx AFib on amiodarone  Hepatobil: shocked liver- AST 818, ALT 903- improving, alk phos 152, Tbili 3.3, albumin 1.5, prealbumin <3, triglycerides 210  Neuro: GCS 12, RASS -1 on fent drip  Heme: aPTT 40 seconds, PT 18.6, INR 1.6- improving. Patient has received vitamin K and FFP as reversal before surgeries. Hgb 8.8, plts 42. Does have ulcers and anticoagulation has been held in the past d/t GI bleed  ID: high risk of perforation, r/o bacterial translocation. On vanc, Zosyn, fluconazole and metronidazole. WBC 9.9, currently afebrile. LA 1.5 this morning.  Best Practices: mouth care, SCDs  TPN Access: left IJ- triple lumen (placed 12/8) TPN day#: 2  Plan:  1. Continue Clinimix E 5/15 at  66mL/hr as patient is exhibiting signs of refeeding. Will utilize full multivitamin and trace elements. (If patient becomes jaundiced, will need to reduce to half trace elements.) Goal rate will be 34mL/hr- this will provide 100g protein and 2025 kcal daily- would meet 90% of protein needs and >100% of kcal needs. D/t premixed Clinimix solutions, unable to provide more protein without providing significantly more kcal than needed. GFR of goal rate would be 2.7mg /kg/min 2. Lipid emulsion 20% at 23mL/hr 3. NS to continue at 57mL/hr 4. Continue CBGs and sensitive SSI q4- watching closely and will increase if CBGs rise 5. BMET, phosphorous and magnesium tomorrow morning. Other TPN labs as ordered. No other repletion needed at this time since patient's potassium and phosphorous were repleted this morning by [facility]. 6. Follow along for clinical progression, prognosis, ability to tolerate TPN   Malva Diesing D. Sonnie Bias, PharmD, BCPS Clinical Pharmacist Pager: [phone] 07/24/2013 11:10 AM

## 2013-07-24 NOTE — Progress Notes (Signed)
Patient ID: [name], male   DOB: [birth year], 77 y.o.   MRN: [id number]  General Surgery - [facility] - Progress Note  POD# 3 & 2  Subjective: Patient awakens to voice.  Answers yes and no questions.  No complaints.  Objective: Vital signs in last 24 hours: Temp:  [97.6 F (36.4 C)-98.5 F (36.9 C)] 98.2 F (36.8 C) (12/11 0400) Pulse Rate:  [63-85] 85 (12/11 0740) Resp:  [9-27] 16 (12/11 0740) BP: (89-126)/(39-73) 92/45 mmHg (12/11 0740) SpO2:  [88 %-100 %] 100 % (12/11 0740) FiO2 (%):  [40 %-80 %] 60 % (12/11 0750) Weight:  [173 lb 1 oz (78.5 kg)] 173 lb 1 oz (78.5 kg) (12/11 0500) Last BM Date: 07/18/13  Intake/Output from previous day: 12/10 0701 - 12/11 0700 In: 5399.8 [I.V.:2989; NG/GT:120; IV Piggyback:1747.5; TPN:543.3] Out: 1135 [Urine:735; Emesis/NG output:400]  Exam: HEENT - clear, not icteric Neck - soft Chest - coarse bilaterally Cor - RRR, rate 70's Abd - soft, mild distension; wound clear and dry and intact - dressing removed  Lab Results:   Recent Labs  07/23/13 0403 07/24/13 0346  WBC 12.5* 9.9  HGB 9.0* 8.8*  HCT 26.9* 26.0*  PLT 54* 42*     Recent Labs  07/23/13 0403 07/24/13 0346  NA 143 143  K 3.1* 3.2*  CL 106 110  CO2 26 25  GLUCOSE 90 125*  BUN 30* 33*  CREATININE 1.05 1.08  CALCIUM 7.3* 7.2*    Studies/Results: Dg Chest Port 1 View  07/24/2013   CLINICAL DATA:  Endotracheal tube position. Decreased oxygen saturations.  EXAM: PORTABLE CHEST - 1 VIEW  COMPARISON:  07/23/2013  FINDINGS: Endotracheal tube is in place with tip likely just above the carina. Consider withdrawing endotracheal tube approximately 2 cm. Nasogastric tube is in place with tip off the film but beyond the lower esophagus. Left IJ central line tip overlies the level of the superior vena cava. Bilateral lower lobe infiltrates/effusions again noted.  IMPRESSION: 1. Endotracheal tube tip just above the carina. 2. Bilateral lower lobe  opacities, similar in appearance to prior study. Findings are consistent with infiltrates and effusions.   Electronically Signed   By: Rosalie Gums M.D.   On: 07/24/2013 01:57   Dg Chest Port 1 View  07/23/2013   CLINICAL DATA:  Ventilated patient.  History of abdominal surgery.  EXAM: PORTABLE CHEST - 1 VIEW  COMPARISON:  07/22/2013  FINDINGS: Endotracheal tube is 4.9 cm above the carina. Jugular central venous catheter in the upper SVC region. Nasogastric tube extends into the abdomen. Increased densities in the right lower chest could represent developing airspace disease or asymmetric edema. Slightly increased densities at the left lung base suggest atelectasis and suspect small pleural effusions. Heart size is stable. No evidence for a pneumothorax.  IMPRESSION: Increased densities in the right lower chest are concerning for developing airspace disease or asymmetric edema.  Probable small pleural effusions.   Electronically Signed   By: Richarda Overlie M.D.   On: 07/23/2013 08:08    Assessment / Plan: 1.  Intestinal ischemia and infarction  Status post massive enterectomy with anastomosis  TNA for nutritional support  Per CCM and Vascular surgery.  Will continue to follow closely with you.  Velora Heckler, MD, [facility] Office: [phone]  07/24/2013

## 2013-07-25 ENCOUNTER — Inpatient Hospital Stay (HOSPITAL_COMMUNITY): Payer: Medicare Other | Admitting: Certified Registered"

## 2013-07-25 ENCOUNTER — Encounter (HOSPITAL_COMMUNITY): Payer: Self-pay | Admitting: Certified Registered"

## 2013-07-25 ENCOUNTER — Encounter (HOSPITAL_COMMUNITY): Payer: Medicare Other | Admitting: Certified Registered"

## 2013-07-25 ENCOUNTER — Inpatient Hospital Stay (HOSPITAL_COMMUNITY): Payer: Medicare Other

## 2013-07-25 ENCOUNTER — Encounter (HOSPITAL_COMMUNITY)
Admission: EM | Disposition: A | Discharge status: Other Acute Inpatient Hospital | Payer: Self-pay | Source: Home / Self Care | Attending: Internal Medicine

## 2013-07-25 DIAGNOSIS — I743 Embolism and thrombosis of arteries of the lower extremities: Secondary | ICD-10-CM

## 2013-07-25 DIAGNOSIS — I739 Peripheral vascular disease, unspecified: Secondary | ICD-10-CM

## 2013-07-25 HISTORY — PX: THROMBECTOMY FEMORAL ARTERY: SHX6406

## 2013-07-25 LAB — GLUCOSE, CAPILLARY
Glucose-Capillary: 116 mg/dL — ABNORMAL HIGH (ref 70–99)
Glucose-Capillary: 119 mg/dL — ABNORMAL HIGH (ref 70–99)
Glucose-Capillary: 119 mg/dL — ABNORMAL HIGH (ref 70–99)

## 2013-07-25 LAB — DIC (DISSEMINATED INTRAVASCULAR COAGULATION)PANEL
D-Dimer, Quant: >20 ug/mL-FEU — ABNORMAL HIGH (ref 0.00–0.48)
Fibrinogen: 160 mg/dL — ABNORMAL LOW (ref 204–475)
INR: 1.59 — ABNORMAL HIGH (ref 0.00–1.49)
INR: 1.58 — ABNORMAL HIGH (ref 0.00–1.49)
Platelets: 38 10*3/uL — ABNORMAL LOW (ref 150–400)
Prothrombin Time: 18.5 seconds — ABNORMAL HIGH (ref 11.6–15.2)
aPTT: 42 seconds — ABNORMAL HIGH (ref 24–37)

## 2013-07-25 LAB — CBC
HCT: 30.4 % — ABNORMAL LOW (ref 39.0–52.0)
MCH: 29.4 pg (ref 26.0–34.0)
MCHC: 32.6 g/dL (ref 30.0–36.0)
MCV: 90.2 fL (ref 78.0–100.0)
Platelets: 16 10*3/uL — CL (ref 150–400)
RDW: 18 % — ABNORMAL HIGH (ref 11.5–15.5)
WBC: 11.3 10*3/uL — ABNORMAL HIGH (ref 4.0–10.5)

## 2013-07-25 LAB — COMPREHENSIVE METABOLIC PANEL
ALT: 694 U/L — ABNORMAL HIGH (ref 0–53)
AST: 277 U/L — ABNORMAL HIGH (ref 0–37)
Albumin: 1.6 g/dL — ABNORMAL LOW (ref 3.5–5.2)
Alkaline Phosphatase: 178 U/L — ABNORMAL HIGH (ref 39–117)
BUN: 30 mg/dL — ABNORMAL HIGH (ref 6–23)
CO2: 26 mEq/L (ref 19–32)
Chloride: 111 mEq/L (ref 96–112)
GFR calc non Af Amer: 78 mL/min — ABNORMAL LOW (ref >90)
Glucose, Bld: 128 mg/dL — ABNORMAL HIGH (ref 70–99)
Potassium: 3.4 mEq/L — ABNORMAL LOW (ref 3.5–5.1)
Sodium: 142 mEq/L (ref 135–145)
Total Bilirubin: 3.5 mg/dL — ABNORMAL HIGH (ref 0.3–1.2)
Total Protein: 3.7 g/dL — ABNORMAL LOW (ref 6.0–8.3)

## 2013-07-25 LAB — DIC (DISSEMINATED INTRAVASCULAR COAGULATION) PANEL: Prothrombin Time: 18.4 seconds — ABNORMAL HIGH (ref 11.6–15.2)

## 2013-07-25 LAB — MAGNESIUM: Magnesium: 1.7 mg/dL (ref 1.5–2.5)

## 2013-07-25 LAB — LACTIC ACID, PLASMA: Lactic Acid, Venous: 1.1 mmol/L (ref 0.5–2.2)

## 2013-07-25 SURGERY — THROMBECTOMY, ARTERY, FEMORAL
Anesthesia: General | Site: Leg Upper | Laterality: Right

## 2013-07-25 MED ORDER — SODIUM CHLORIDE 0.9 % IV SOLN
INTRAVENOUS | Status: AC
  Administered 2013-07-25 – 2013-07-26 (×2): via INTRAVENOUS

## 2013-07-25 MED ORDER — FENTANYL CITRATE 0.05 MG/ML IJ SOLN
INTRAMUSCULAR | Status: DC | PRN
  Administered 2013-07-25: 50 ug via INTRAVENOUS

## 2013-07-25 MED ORDER — PROPOFOL 10 MG/ML IV BOLUS
INTRAVENOUS | Status: DC | PRN
  Administered 2013-07-25: 50 mg via INTRAVENOUS

## 2013-07-25 MED ORDER — VANCOMYCIN HCL IN DEXTROSE 1-5 GM/200ML-% IV SOLN
1000.0000 mg | INTRAVENOUS | Status: AC
  Administered 2013-07-25: 1000 mg via INTRAVENOUS
  Filled 2013-07-25 (×2): qty 200

## 2013-07-25 MED ORDER — NOREPINEPHRINE BITARTRATE 1 MG/ML IJ SOLN
4000.0000 ug | INTRAVENOUS | Status: DC | PRN
  Administered 2013-07-25: 4 ug/min via INTRAVENOUS

## 2013-07-25 MED ORDER — SODIUM CHLORIDE 0.9 % IV SOLN
2500.0000 ug | INTRAVENOUS | Status: DC | PRN
  Administered 2013-07-25: 100 ug/h via INTRAVENOUS

## 2013-07-25 MED ORDER — ROCURONIUM BROMIDE 100 MG/10ML IV SOLN
INTRAVENOUS | Status: DC | PRN
  Administered 2013-07-25: 20 mg via INTRAVENOUS

## 2013-07-25 MED ORDER — MAGNESIUM SULFATE 40 MG/ML IJ SOLN
2.0000 g | Freq: Once | INTRAMUSCULAR | Status: AC
  Administered 2013-07-25: 2 g via INTRAVENOUS
  Filled 2013-07-25: qty 50

## 2013-07-25 MED ORDER — TRACE MINERALS CR-CU-F-FE-I-MN-MO-SE-ZN IV SOLN
INTRAVENOUS | Status: AC
  Administered 2013-07-25: 17:00:00 via INTRAVENOUS
  Filled 2013-07-25: qty 2000

## 2013-07-25 MED ORDER — 0.9 % SODIUM CHLORIDE (POUR BTL) OPTIME
TOPICAL | Status: DC | PRN
  Administered 2013-07-25 (×2): 1000 mL

## 2013-07-25 MED ORDER — POTASSIUM CHLORIDE 10 MEQ/50ML IV SOLN
10.0000 meq | INTRAVENOUS | Status: AC
  Administered 2013-07-25 (×2): 10 meq via INTRAVENOUS
  Filled 2013-07-25 (×2): qty 50

## 2013-07-25 MED ORDER — FAT EMULSION 20 % IV EMUL
240.0000 mL | INTRAVENOUS | Status: AC
  Administered 2013-07-25: 240 mL via INTRAVENOUS
  Filled 2013-07-25: qty 250

## 2013-07-25 MED ORDER — SODIUM CHLORIDE 0.9 % IR SOLN
Status: DC | PRN
  Administered 2013-07-25: 10:00:00

## 2013-07-25 SURGICAL SUPPLY — 56 items
APL SKNCLS STERI-STRIP NONHPOA (GAUZE/BANDAGES/DRESSINGS) ×1
BANDAGE ESMARK 6X9 LF (GAUZE/BANDAGES/DRESSINGS) IMPLANT
BENZOIN TINCTURE PRP APPL 2/3 (GAUZE/BANDAGES/DRESSINGS) ×2 IMPLANT
BNDG CMPR 9X6 STRL LF SNTH (GAUZE/BANDAGES/DRESSINGS)
BNDG ESMARK 6X9 LF (GAUZE/BANDAGES/DRESSINGS)
CANISTER SUCTION 2500CC (MISCELLANEOUS) ×2 IMPLANT
CANNULA VESSEL 3MM 2 BLNT TIP (CANNULA) ×2 IMPLANT
CATH EMB 4FR 80CM (CATHETERS) ×1 IMPLANT
CLIP LIGATING EXTRA MED SLVR (CLIP) ×2 IMPLANT
CLIP LIGATING EXTRA SM BLUE (MISCELLANEOUS) ×2 IMPLANT
CLSR STERI-STRIP ANTIMIC 1/2X4 (GAUZE/BANDAGES/DRESSINGS) ×1 IMPLANT
CONT SPEC 4OZ CLIKSEAL STRL BL (MISCELLANEOUS) ×1 IMPLANT
COVER SURGICAL LIGHT HANDLE (MISCELLANEOUS) ×2 IMPLANT
CUFF TOURNIQUET SINGLE 34IN LL (TOURNIQUET CUFF) IMPLANT
CUFF TOURNIQUET SINGLE 44IN (TOURNIQUET CUFF) IMPLANT
DRAIN SNY 10X20 3/4 PERF (WOUND CARE) IMPLANT
DRAPE WARM FLUID 44X44 (DRAPE) ×2 IMPLANT
DRAPE X-RAY CASS 24X20 (DRAPES) IMPLANT
DRSG COVADERM 4X10 (GAUZE/BANDAGES/DRESSINGS) IMPLANT
DRSG COVADERM 4X8 (GAUZE/BANDAGES/DRESSINGS) IMPLANT
ELECT REM PT RETURN 9FT ADLT (ELECTROSURGICAL) ×2
ELECTRODE REM PT RTRN 9FT ADLT (ELECTROSURGICAL) ×1 IMPLANT
EVACUATOR SILICONE 100CC (DRAIN) IMPLANT
GLOVE BIO SURGEON STRL SZ 6.5 (GLOVE) ×2 IMPLANT
GLOVE ECLIPSE 6.5 STRL STRAW (GLOVE) ×1 IMPLANT
GLOVE SS BIOGEL STRL SZ 7.5 (GLOVE) ×1 IMPLANT
GLOVE SUPERSENSE BIOGEL SZ 7.5 (GLOVE) ×1
GOWN STRL NON-REIN LRG LVL3 (GOWN DISPOSABLE) ×6 IMPLANT
INSERT FOGARTY SM (MISCELLANEOUS) IMPLANT
KIT BASIN OR (CUSTOM PROCEDURE TRAY) ×2 IMPLANT
KIT ROOM TURNOVER OR (KITS) ×2 IMPLANT
NS IRRIG 1000ML POUR BTL (IV SOLUTION) ×4 IMPLANT
PACK PERIPHERAL VASCULAR (CUSTOM PROCEDURE TRAY) ×2 IMPLANT
PAD ARMBOARD 7.5X6 YLW CONV (MISCELLANEOUS) ×4 IMPLANT
PADDING CAST COTTON 6X4 STRL (CAST SUPPLIES) IMPLANT
SET COLLECT BLD 21X3/4 12 (NEEDLE) IMPLANT
SPONGE GAUZE 4X4 12PLY (GAUZE/BANDAGES/DRESSINGS) ×2 IMPLANT
STAPLER VISISTAT 35W (STAPLE) IMPLANT
STOPCOCK 4 WAY LG BORE MALE ST (IV SETS) IMPLANT
STRIP CLOSURE SKIN 1/2X4 (GAUZE/BANDAGES/DRESSINGS) ×2 IMPLANT
SUT ETHILON 3 0 PS 1 (SUTURE) IMPLANT
SUT PROLENE 5 0 C 1 24 (SUTURE) ×2 IMPLANT
SUT PROLENE 6 0 CC (SUTURE) ×2 IMPLANT
SUT SILK 2 0 SH (SUTURE) ×2 IMPLANT
SUT VIC AB 2-0 CTX 36 (SUTURE) ×4 IMPLANT
SUT VIC AB 3-0 SH 27 (SUTURE) ×4
SUT VIC AB 3-0 SH 27X BRD (SUTURE) ×2 IMPLANT
SYR 3ML LL SCALE MARK (SYRINGE) ×1 IMPLANT
SYR TB 1ML LUER SLIP (SYRINGE) ×1 IMPLANT
TAPE CLOTH SURG 4X10 WHT LF (GAUZE/BANDAGES/DRESSINGS) ×1 IMPLANT
TOWEL OR 17X24 6PK STRL BLUE (TOWEL DISPOSABLE) ×4 IMPLANT
TOWEL OR 17X26 10 PK STRL BLUE (TOWEL DISPOSABLE) ×4 IMPLANT
TRAY FOLEY CATH 16FRSI W/METER (SET/KITS/TRAYS/PACK) ×1 IMPLANT
TUBING EXTENTION W/L.L. (IV SETS) IMPLANT
UNDERPAD 30X30 INCONTINENT (UNDERPADS AND DIAPERS) ×2 IMPLANT
WATER STERILE IRR 1000ML POUR (IV SOLUTION) ×2 IMPLANT

## 2013-07-25 NOTE — Transfer of Care (Signed)
Immediate Anesthesia Transfer of Care Note  Patient: [name]  Procedure(s) Performed: Procedure(s): THROMBECTOMY OF  FEMORAL ARTERY (Right)  Patient Location: SICU  Anesthesia Type:General  Level of Consciousness: Patient remains intubated per anesthesia plan  Airway & Oxygen Therapy: Patient remains intubated per anesthesia plan  Post-op Assessment: Report given to PACU RN  Post vital signs: stable  Complications: No apparent anesthesia complications

## 2013-07-25 NOTE — Progress Notes (Signed)
ANTIBIOTIC CONSULT NOTE - FOLLOW UP  Pharmacy Consult for vancomycin/zosyn/fluconazole Indication: pneumonia  No Known Allergies  Patient Measurements: Height: 6' (182.9 cm) Weight: 183 lb 3.2 oz (83.1 kg) IBW/kg (Calculated) : 77.6  Vital Signs: Temp: 97.6 F (36.4 C) (12/12 0758) Temp src: Oral (12/12 0758) BP: 118/52 mmHg (12/12 0800) Pulse Rate: 67 (12/12 0800) Intake/Output from previous day: 12/11 0701 - 12/12 0700 In: 4856.5 [I.V.:2514; Blood:242.5; NG/GT:140; IV Piggyback:1000; TPN:960] Out: 1800 [Urine:1050; Emesis/NG output:750] Intake/Output from this shift: Total I/O In: 323.1 [I.V.:43.1; IV Piggyback:200; TPN:80] Out: 100 [Urine:100]  Labs:  Recent Labs  07/23/13 0403 07/24/13 0346 07/25/13 0330 07/25/13 0431  WBC 12.5* 9.9 11.3*  --   HGB 9.0* 8.8* 9.9*  --   PLT 54* 42* 16* 19*  CREATININE 1.05 1.08 0.97  --    Estimated Creatinine Clearance: 71.1 ml/min (by C-G formula based on Cr of 0.97).  Recent Labs  07/22/13 1930  VANCOTROUGH 18.0     Microbiology: Recent Results (from the past 720 hour(s))  MRSA PCR SCREENING     Status: None   Collection Time    07/06/13 10:25 PM      Result Value Range Status   MRSA by PCR NEGATIVE  NEGATIVE Final   Comment:            The GeneXpert MRSA Assay (FDA     approved for NASAL specimens     only), is one component of a     comprehensive MRSA colonization     surveillance program. It is not     intended to diagnose MRSA     infection nor to guide or     monitor treatment for     MRSA infections.  CLOSTRIDIUM DIFFICILE BY PCR     Status: None   Collection Time    07/08/13  4:55 PM      Result Value Range Status   C difficile by pcr NEGATIVE  NEGATIVE Final  STOOL CULTURE     Status: None   Collection Time    07/08/13  4:55 PM      Result Value Range Status   Specimen Description STOOL   Final   Special Requests Normal   Final   Culture     Final   Value: NO SALMONELLA, SHIGELLA,  CAMPYLOBACTER, YERSINIA, OR E.COLI 0157:H7 ISOLATED     Performed at [facility]   Report Status 07/12/2013 FINAL   Final  CULTURE, BLOOD (ROUTINE X 2)     Status: None   Collection Time    07/21/13  7:45 AM      Result Value Range Status   Specimen Description BLOOD LEFT ARM   Final   Special Requests BOTTLES DRAWN AEROBIC ONLY 10CC   Final   Culture  Setup Time     Final   Value: 07/21/2013 13:39     Performed at [facility]   Culture     Final   Value:        BLOOD CULTURE RECEIVED NO GROWTH TO DATE CULTURE WILL BE HELD FOR 5 DAYS BEFORE ISSUING A FINAL NEGATIVE REPORT     Performed at [facility]   Report Status PENDING   Incomplete  CULTURE, BLOOD (ROUTINE X 2)     Status: None   Collection Time    07/21/13  7:50 AM      Result Value Range Status   Specimen Description BLOOD LEFT ARM   Final  Special Requests BOTTLES DRAWN AEROBIC ONLY 6CC   Final   Culture  Setup Time     Final   Value: 07/21/2013 13:39     Performed at [facility]   Culture     Final   Value:        BLOOD CULTURE RECEIVED NO GROWTH TO DATE CULTURE WILL BE HELD FOR 5 DAYS BEFORE ISSUING A FINAL NEGATIVE REPORT     Performed at [facility]   Report Status PENDING   Incomplete  CULTURE, RESPIRATORY (NON-EXPECTORATED)     Status: None   Collection Time    07/21/13  3:10 PM      Result Value Range Status   Specimen Description ENDOTRACHEAL   Final   Special Requests NONE   Final   Gram Stain     Final   Value: FEW WBC PRESENT,BOTH PMN AND MONONUCLEAR     NO SQUAMOUS EPITHELIAL CELLS SEEN     FEW GRAM POSITIVE RODS     RARE GRAM NEGATIVE RODS     RARE GRAM POSITIVE COCCI   Culture     Final   Value: MODERATE ESCHERICHIA COLI     Performed at [facility]   Report Status 07/24/2013 FINAL   Final   Organism ID, Bacteria ESCHERICHIA COLI   Final    Anti-infectives   Start     Dose/Rate Route Frequency Ordered Stop   07/22/13 2200  vancomycin  (VANCOCIN) IVPB 1000 mg/200 mL premix     1,000 mg 200 mL/hr over 60 Minutes Intravenous Every 12 hours 07/22/13 2056     07/21/13 1000  fluconazole (DIFLUCAN) IVPB 200 mg     200 mg 100 mL/hr over 60 Minutes Intravenous Every 24 hours 07/21/13 0944     07/21/13 0945  metroNIDAZOLE (FLAGYL) IVPB 500 mg  Status:  Discontinued     500 mg 100 mL/hr over 60 Minutes Intravenous Every 8 hours 07/21/13 0933 07/25/13 0918   07/21/13 0945  piperacillin-tazobactam (ZOSYN) IVPB 3.375 g     3.375 g 12.5 mL/hr over 240 Minutes Intravenous 3 times per day 07/21/13 0944     07/21/13 0800  vancomycin (VANCOCIN) IVPB 1000 mg/200 mL premix  Status:  Discontinued     1,000 mg 200 mL/hr over 60 Minutes Intravenous Every 12 hours 07/21/13 0626 07/22/13 1045   07/21/13 0615  ceFEPIme (MAXIPIME) 1 g in dextrose 5 % 50 mL IVPB  Status:  Discontinued     1 g 100 mL/hr over 30 Minutes Intravenous 3 times per day 07/21/13 1610 07/21/13 0919      Assessment: Pt being treated for suspected asp. pneumonia, extensive bowel ischemia, bowel resection, rule out bacterial translocation. Pt is afebrile. Renal function has remained stable.  Last vanc trough 12/9, was therapeutic at 18.  Pt is currently receiving vanc, zosyn, fluconazole, all were initiated on 12/8.   Goal of Therapy:  Vancomycin trough level 15-20 mcg/ml  Plan:  1) Continue vancomycin 1g IV q12h  2) Continue zosyn 3.375 mg IV q8h 3) Continue fluconazole 200 mg IV daily 4) Consider vanc trough 12/15 5) Will follow patient's renal function, clinical progress  Agapito Games, PharmD, BCPS Clinical Pharmacist 07/25/2013 9:34 AM

## 2013-07-25 NOTE — Anesthesia Preprocedure Evaluation (Addendum)
Anesthesia Evaluation  Patient identified by MRN, date of birth, ID band Patient awake    Reviewed: Allergy & Precautions, NPO status , Patient's Chart, lab work & pertinent test results, reviewed documented beta blocker date and time   Airway       Dental   Pulmonary COPDCurrent Smoker,  + rhonchi         Cardiovascular hypertension, Pt. on medications + CAD, + Past MI and + Peripheral Vascular Disease + dysrhythmias Atrial Fibrillation Rhythm:Regular Rate:Normal     Neuro/Psych PSYCHIATRIC DISORDERS  Neuromuscular disease    GI/Hepatic PUD, GERD-  ,  Endo/Other    Renal/GU      Musculoskeletal   Abdominal   Peds  Hematology  (+) anemia ,   Anesthesia Other Findings   Reproductive/Obstetrics                          Anesthesia Physical Anesthesia Plan  ASA: IV  Anesthesia Plan: General   Post-op Pain Management:    Induction: Inhalational and Intravenous  Airway Management Planned: Oral ETT  Additional Equipment:   Intra-op Plan:   Post-operative Plan: Post-operative intubation/ventilation  Informed Consent: I have reviewed the patients History and Physical, chart, labs and discussed the procedure including the risks, benefits and alternatives for the proposed anesthesia with the patient or authorized representative who has indicated his/her understanding and acceptance.     Plan Discussed with:   Anesthesia Plan Comments:         Anesthesia Quick Evaluation

## 2013-07-25 NOTE — Progress Notes (Signed)
Subjective: Interval History: none.. still responsive on the vent. Reports pain in his right foot  Objective: Vital signs in last 24 hours: Temp:  [97.4 F (36.3 C)-99.3 F (37.4 C)] 97.6 F (36.4 C) (12/12 0758) Pulse Rate:  [61-85] 69 (12/12 0954) Resp:  [9-25] 16 (12/12 0954) BP: (91-138)/(41-55) 138/45 mmHg (12/12 0954) SpO2:  [93 %-100 %] 100 % (12/12 0954) Arterial Line BP: (101-144)/(41-85) 133/54 mmHg (12/12 0800) FiO2 (%):  [40 %-60 %] 40 % (12/12 0954) Weight:  [183 lb 3.2 oz (83.1 kg)] 183 lb 3.2 oz (83.1 kg) (12/12 0500)  Intake/Output from previous day: 12/11 0701 - 12/12 0700 In: 4856.5 [I.V.:2514; Blood:242.5; NG/GT:140; IV Piggyback:1000; TPN:960] Out: 1800 [Urine:1050; Emesis/NG output:750] Intake/Output this shift: Total I/O In: 323.1 [I.V.:43.1; IV Piggyback:200; TPN:80] Out: 100 [Urine:100]  Right groin with no femoral pulse. Does have motor function in his right foot. Reports pain in his right foot. His right foot is much more dusky that it was yesterday and is cool compared to the left.  Lab Results:  Recent Labs  07/24/13 0346 07/25/13 0330 07/25/13 0431  WBC 9.9 11.3*  --   HGB 8.8* 9.9*  --   HCT 26.0* 30.4*  --   PLT 42* 16* 19*   BMET  Recent Labs  07/24/13 0346 07/25/13 0330  NA 143 142  K 3.2* 3.4*  CL 110 111  CO2 25 26  GLUCOSE 125* 128*  BUN 33* 30*  CREATININE 1.08 0.97  CALCIUM 7.2* 7.1*    Studies/Results: Dg Chest 2 View  07/06/2013   CLINICAL DATA:  Epigastric pain for 1 week, low blood pressure  EXAM: CHEST  2 VIEW  COMPARISON:  06/25/2013  FINDINGS: Heart size mildly enlarged. Heavy aortic arch calcification. Vascular pattern normal. Mild diffuse interstitial change most prominent in the right middle lobe and left base. Unchanged appearance when compared to prior study, except for mild decrease in the overall prominence of interstitial change. Right internal jugular catheter has been removed.  IMPRESSION: Decreased  interstitial conspicuity suggesting resolving interstitial pulmonary edema.   Electronically Signed   By: Esperanza Heir M.D.   On: 07/06/2013 18:50   Dg Abd 1 View  07/07/2013   CLINICAL DATA:  Increased abdominal pain, evaluate for ileus  EXAM: ABDOMEN - 1 VIEW  COMPARISON:  06/17/2013  FINDINGS: Mild nonspecific gas distention of the bowel. Negative for significant obstruction or ileus. Extensive aortoiliac calcifications noted. Bilateral renal and common iliac stents present. No acute osseous finding.  IMPRESSION: Negative for significant obstruction or ileus.   Electronically Signed   By: Ruel Favors M.D.   On: 07/07/2013 08:19   US Abdomen Complete  07/08/2013   CLINICAL DATA:  Nausea.  Abdominal pain.  EXAM: ULTRASOUND ABDOMEN COMPLETE  COMPARISON:  None.  FINDINGS: Gallbladder  Several small less than 1 cm gallstones noted as well as minimal amount of sludge. No evidence of gallbladder wall thickening. No sonographic Murphy sign noted.  Common bile duct  Diameter: 4 mm  Liver  No focal lesion identified. Within normal limits in parenchymal echogenicity.  IVC  No abnormality visualized.  Pancreas  Visualized portion unremarkable.  Spleen  Size and appearance within normal limits.  Right Kidney  Length: 11.0 cm. Echogenicity within normal limits. No mass or hydronephrosis visualized.  Left Kidney  Length: 12.4 cm. Echogenicity within normal limits. No mass or hydronephrosis visualized.  Abdominal aorta  No aneurysm visualized.  IMPRESSION: Cholelithiasis. No sonographic signs of cholecystitis, biliary dilatation, or  other acute findings.   Electronically Signed   By: Myles Rosenthal M.D.   On: 07/08/2013 20:26   Ct Abdomen Pelvis W Contrast  07/18/2013   CLINICAL DATA:  Vomiting. Recently discharged after being admitted for peptic ulcers.  EXAM: CT ABDOMEN AND PELVIS WITH CONTRAST  TECHNIQUE: Multidetector CT imaging of the abdomen and pelvis was performed using the standard protocol following bolus  administration of intravenous contrast.  CONTRAST:  OMNIPAQUE IOHEXOL 300 MG/ML  SOLN  COMPARISON:  06/17/2013.  FINDINGS: Multiple sigmoid colon diverticula are again demonstrated without evidence of diverticulitis. Multiple normal caliber fluid-filled loops of small bowel. Diffuse low density wall thickening in the duodenal bulb, measuring 1.2 cm in thickness on image number 26. This is producing moderate luminal narrowing. Tiny gallstones in the gallbladder. The largest measures 4 mm in maximum diameter. No gallbladder wall thickening or pericholecystic fluid.  Inhomogeneous enhancement of the liver and spleen with no discrete liver mass seen. 2.6 x 2.1 cm left adrenal mass with low density components. This was previously shown to measure 5 Hounsfield units without intravenous contrast, compatible with a benign adenoma. Small left renal cysts. Unremarkable right kidney, urinary bladder and prostate gland. No enlarged lymph nodes. No evidence of appendicitis. Dense atheromatous arterial calcifications. Bilateral renal artery stents. The no significant change in mild bibasilar atelectasis/scarring. Mild lumbar lower thoracic spine degenerative changes.  IMPRESSION: 1. Do tonight is involving the duodenal bulb with moderate luminal narrowing. 2. Normal caliber fluid-filled small bowel loops. This can be seen with gastroenteritis. 3. Cholelithiasis without evidence of cholecystitis. 4. Extensive arterial calcifications with bilateral renal artery stents. 5. Sigmoid diverticulosis. 6. Stable left adrenal adenoma.   Electronically Signed   By: Gordan Payment M.D.   On: 07/18/2013 18:27   US Renal  07/07/2013   CLINICAL DATA:  Renal failure  EXAM: RENAL/URINARY TRACT ULTRASOUND COMPLETE  COMPARISON:  CT abdomen and pelvis 06/17/2013  FINDINGS: Right Kidney  Length: 10.3 cm. Normal cortical thickness and echogenicity for age. . 3 mm echogenic non shadowing focus mid right kidney corresponding the calculus on prior  CT. No mass or hydronephrosis.  Left Kidney  Length: 11.5 cm. Normal cortical thickness and echogenicity for age. No mass, hydronephrosis or shadowing calcification.  Bladder  Appears normal for degree of bladder distention.  IMPRESSION: 3 mm nonobstructing mid right renal calculus.  Otherwise negative exam.   Electronically Signed   By: Ulyses Southward M.D.   On: 07/07/2013 08:48   Nm Hepato W/eject Fract  07/11/2013   CLINICAL DATA:  Pain.  Nausea and vomiting.  EXAM: NUCLEAR MEDICINE HEPATOBILIARY IMAGING WITH GALLBLADDER EF  TECHNIQUE: Sequential images of the abdomen were obtained out to 60 minutes following intravenous administration of radiopharmaceutical. After slow intravenous infusion of 1.67 micrograms Cholecystokinin, gallbladder ejection fraction was determined.  COMPARISON:  Ultrasound 07/08/2013.  RADIOPHARMACEUTICALS:  41mCiTc-87m Choletec  FINDINGS: Liver, gallbladder, biliary system, and bowel appear normal. At 30 min, normal ejection fraction is greater than 30%.  The patient did not experience symptoms during CCK infusion.  IMPRESSION: Normal exam.   Electronically Signed   By: Maisie Fus  Register   On: 07/11/2013 12:50   Dg Chest Port 1 View  07/25/2013   CLINICAL DATA:  Respiratory failure and bowel resection.  EXAM: PORTABLE CHEST - 1 VIEW  COMPARISON:  07/24/2013  FINDINGS: Endotracheal tube tip approximately 4.5 cm above the carina. Central line positioning is stable. Lungs show improved aeration of both lower lobes. Underlying chronic lung disease  is stable.  IMPRESSION: Improved aeration of both lower lobes.   Electronically Signed   By: Irish Lack M.D.   On: 07/25/2013 07:00   Dg Chest Port 1 View  07/24/2013   CLINICAL DATA:  Endotracheal tube position. Decreased oxygen saturations.  EXAM: PORTABLE CHEST - 1 VIEW  COMPARISON:  07/23/2013  FINDINGS: Endotracheal tube is in place with tip likely just above the carina. Consider withdrawing endotracheal tube approximately 2 cm.  Nasogastric tube is in place with tip off the film but beyond the lower esophagus. Left IJ central line tip overlies the level of the superior vena cava. Bilateral lower lobe infiltrates/effusions again noted.  IMPRESSION: 1. Endotracheal tube tip just above the carina. 2. Bilateral lower lobe opacities, similar in appearance to prior study. Findings are consistent with infiltrates and effusions.   Electronically Signed   By: Rosalie Gums M.D.   On: 07/24/2013 01:57   Dg Chest Port 1 View  07/23/2013   CLINICAL DATA:  Ventilated patient.  History of abdominal surgery.  EXAM: PORTABLE CHEST - 1 VIEW  COMPARISON:  07/22/2013  FINDINGS: Endotracheal tube is 4.9 cm above the carina. Jugular central venous catheter in the upper SVC region. Nasogastric tube extends into the abdomen. Increased densities in the right lower chest could represent developing airspace disease or asymmetric edema. Slightly increased densities at the left lung base suggest atelectasis and suspect small pleural effusions. Heart size is stable. No evidence for a pneumothorax.  IMPRESSION: Increased densities in the right lower chest are concerning for developing airspace disease or asymmetric edema.  Probable small pleural effusions.   Electronically Signed   By: Richarda Overlie M.D.   On: 07/23/2013 08:08   Dg Chest Port 1 View  07/22/2013   CLINICAL DATA:  Status post laparotomy and small bowel resection for bowel infarction.  EXAM: PORTABLE CHEST - 1 VIEW  COMPARISON:  07/21/2013.  FINDINGS: Endotracheal tube present with the tip approximately 5 cm above the carina. Central line tip lies in the upper SVC. Lungs show stable chronic disease without evidence of overt edema or airspace consolidation. The heart size is stable and within normal limits.  IMPRESSION: Stable chronic lung disease.   Electronically Signed   By: Irish Lack M.D.   On: 07/22/2013 10:41   Dg Chest Port 1 View  07/21/2013   CLINICAL DATA:  Endotracheal tube and line  placement  EXAM: PORTABLE CHEST - 1 VIEW  COMPARISON:  07/18/2013  FINDINGS: Endotracheal tube tip lies 1.5 cm above the carina. A left internal jugular central venous line tip lies in the mid superior vena cava. A nasogastric tube has also been placed. Its tip lies at the GE junction. It will need to be further inserted to allow the tip to fully into the stomach.  No pneumothorax evident on this semi-erect study.  Lung hyperexpansion, chronic interstitial thickening and areas of lung scarring and probable basilar subsegmental atelectasis are stable. No area of focal consolidation and no overt pulmonary edema.  IMPRESSION: 1. Endotracheal tube and left internal jugular central venous line are well positioned. No pneumothorax. 2. Nasogastric tube tip lies at the GE junction. Recommend further inserting tube 10-15 cm to allow the tip to fully into the stomach. 3. No change in the appearance of the lungs when allowing for differences in technique and patient positioning.   Electronically Signed   By: Amie Portland M.D.   On: 07/21/2013 10:39   Dg Chest Port 1 View  07/18/2013  CLINICAL DATA:  Vomiting.  Smoker.  EXAM: PORTABLE CHEST - 1 VIEW  COMPARISON:  07/06/2013.  FINDINGS: Normal sized heart. Post CABG changes. Decreased inspiration with minimal bibasilar atelectasis. Atheromatous arterial calcifications. Stable right basilar scarring. Stable prominence of the interstitial markings.  IMPRESSION: 1. Poor inspiration with minimal bibasilar atelectasis. 2. Stable changes of COPD with right basilar scarring.   Electronically Signed   By: Gordan Payment M.D.   On: 07/18/2013 14:28   Ct Angio Abd/pel W/ And/or W/o  07/21/2013   CLINICAL DATA:  Epigastric abdominal pain, nausea, vomiting and weight loss. Endoscopy on 12/07 demonstrates severe erosive ulcerative gastritis as well as duodenitis and jejunitis.  EXAM: CT ANGIOGRAPHY ABDOMEN AND PELVIS WITH CONTRAST AND WITHOUT CONTRAST  TECHNIQUE: Multidetector CT  imaging of the abdomen and pelvis was performed using the standard protocol during bolus administration of intravenous contrast. Multiplanar reconstructed images including MIPs were obtained and reviewed to evaluate the vascular anatomy.  CONTRAST:  OMNIPAQUE IOHEXOL 350 MG/ML SOLN  COMPARISON:  Standard CT of the abdomen and pelvis with contrast on 07/18/2013.  FINDINGS: The aorta and major visceral branches are heavily calcified with diffuse atherosclerosis present. The origin of the celiac axis shows critical narrowing and likely near subtotal occlusive disease. Distal branches are opacified and the celiac trunk is likely not completely occluded.  The proximal superior mesenteric artery shows heavily calcified plaque extending for a long distance down the trunk of the SMA. The proximal SMA trunk is completely occluded with reconstitution more distally by jejunal branches related to collateral reconstitution from celiac and IMA supplied. The inferior mesenteric artery origin is heavily calcified but open.  Bilateral renal artery stents are identified which are open. Bilateral common iliac artery stents are also present which are open. The external iliac arteries are diffusely diseased with heavily calcified plaque.  There is likely critical stenosis and near subtotal occlusion at the level of the mid right external iliac artery. Moderate narrowing of the distal external iliac artery is also present just above the inguinal ligament approaching 70-75% narrowing. At the level of the right groin, postsurgical changes are seen likely related to prior femoral bypass with an occluded proximal bypass graft present. The native SFA is also occluded at its origin.  Diffuse disease of the left external iliac artery present without significant stenosis. The common femoral artery shows irregular plaque causing approximately 50% narrowing just below the inguinal ligament. Postsurgical changes are seen in the left groin  with an open proximal bypass graft identified in the anterior thigh. The native SFA is occluded at its origin.  Nonvascular evaluation shows significant interval distension of small bowel loops which are diffusely dilated and fluid-filled. There may be some pneumatosis in the transverse duodenum and proximal jejunum as well, concerning for potential evolution of bowel ischemia to necrosis. The colon is completely decompressed. There is no evidence of free intraperitoneal air or focal abscess. The gallbladder shows some increased distention since the prior study. There is no evidence of overt biliary obstruction, however. Stable left adrenal mass identified. This is fairly low density and likely an incidental adenoma.  Review of the MIP images confirms the above findings.  IMPRESSION: 1. 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 2. 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 3. Heavily calcified but open inferior mesenteric artery. 4. Significant stenoses of the right  external iliac artery. Postsurgical changes are evident in the right groin with occlusion of a previously placed proximal bypass graft. 5. Open left femoral bypass graft. 6. Significant interval distension of small bowel loops which are diffusely dilated and filled with fluid. There also may be a pneumatosis in the duodenum and proximal jejunum concerning for severe ischemia/necrosis. Surgical consultation would be recommended. Critical Value/emergent results were called by telephone at the time of interpretation on 07/21/2013 at 8:00 AM to Dr.Prashant Thedore Mins, who verbally acknowledged these results.   Electronically Signed   By: Irish Lack M.D.   On: 07/21/2013 08:35   Anti-infectives: Anti-infectives   Start     Dose/Rate Route Frequency Ordered Stop   07/22/13 2200   vancomycin (VANCOCIN) IVPB 1000 mg/200 mL premix     1,000 mg 200 mL/hr over 60 Minutes Intravenous Every 12 hours 07/22/13 2056     07/21/13 1000  fluconazole (DIFLUCAN) IVPB 200 mg     200 mg 100 mL/hr over 60 Minutes Intravenous Every 24 hours 07/21/13 0944     07/21/13 0945  metroNIDAZOLE (FLAGYL) IVPB 500 mg  Status:  Discontinued     500 mg 100 mL/hr over 60 Minutes Intravenous Every 8 hours 07/21/13 0933 07/25/13 0918   07/21/13 0945  piperacillin-tazobactam (ZOSYN) IVPB 3.375 g     3.375 g 12.5 mL/hr over 240 Minutes Intravenous 3 times per day 07/21/13 0944     07/21/13 0800  vancomycin (VANCOCIN) IVPB 1000 mg/200 mL premix  Status:  Discontinued     1,000 mg 200 mL/hr over 60 Minutes Intravenous Every 12 hours 07/21/13 0626 07/22/13 1045   07/21/13 0615  ceFEPIme (MAXIPIME) 1 g in dextrose 5 % 50 mL IVPB  Status:  Discontinued     1 g 100 mL/hr over 30 Minutes Intravenous 3 times per day 07/21/13 0613 07/21/13 0919      Assessment/Plan: s/p Procedure(s): EXPLORATORY LAPAROTOMY, SMALL BOWEL RESECTION (N/A) Progressive ischemic changes in his right foot. An extremely long discussion with the wife daughter and multiple family members. I have recommended right femoral exploration to determine if there is easy correction to improve flow into his profunda collaterals. I would not consider her extensive bypass. I do not feel that this will cause any worsening. Did explain the still very high morbidity related to his other difficulties. I explained that in all likelihood this was worsening of his severe disease related to his arteriogram on Monday morning. Didn't knowledge to the low platelets and that we would support him for coagulopathy as well. Ex-preemie other option would be to proceed with amputation and several days and also explained that he still may come to amputation even with attempted improvement. Since we are proceeding with a full aggressive support I feel it is appropriate  to receive with right groin exploration. The family understands and agrees.   LOS: 7 days   Raynesha Tiedt 07/25/2013, 10:15 AM

## 2013-07-25 NOTE — Progress Notes (Signed)
CRITICAL VALUE ALERT  Critical value received:  Platelets 16  Date of notification:  07/25/13  Time of notification:  0430  Critical value read back:yes  Nurse who received alert:  Viviano Simas  MD notified (1st page):  McQuaid, CCM  Time of first page:  0430  MD notified (2nd page):  Time of second page:  Responding MD:  Kendrick Fries  Time MD responded:  0430

## 2013-07-25 NOTE — Progress Notes (Signed)
[facility] ADULT ICU REPLACEMENT PROTOCOL FOR AM LAB REPLACEMENT ONLY  The patient does apply for the [facility] Adult ICU Electrolyte Replacment Protocol based on the criteria listed below:   1. Is GFR >/= 40 ml/min? yes  Patient's GFR today is 78 2. Is urine output >/= 0.5 ml/kg/hr for the last 6 hours? yes Patient's UOP is 0.6 ml/kg/hr 3. Is BUN < 60 mg/dL? yes  Patient's BUN today is 30 4. Abnormal electrolyte(s): K 3.4, Mag 1.7 5. Ordered repletion with: per protocol 6. If a panic level lab has been reported, has the CCM MD in charge been notified? no.   Physician:    Markus Daft A 07/25/2013 4:39 AM

## 2013-07-25 NOTE — Progress Notes (Signed)
Pt VS stable, transferred to procedural area with CRNA Campbell Lerner.

## 2013-07-25 NOTE — Progress Notes (Signed)
  General Surgery [facility] - Progress Note  POD# 4 / 3  Subjective: Patient in ICU on vent.  Awakens to voice.  Responds yes and no.  No complaint of pain.  Objective: Vital signs in last 24 hours: Temp:  [97.4 F (36.3 C)-99.3 F (37.4 C)] 97.6 F (36.4 C) (12/12 0758) Pulse Rate:  [61-85] 67 (12/12 0800) Resp:  [9-25] 15 (12/12 0800) BP: (91-138)/(41-55) 118/52 mmHg (12/12 0800) SpO2:  [93 %-100 %] 98 % (12/12 0800) Arterial Line BP: (101-144)/(41-85) 133/54 mmHg (12/12 0800) FiO2 (%):  [40 %-60 %] 40 % (12/12 0800) Weight:  [183 lb 3.2 oz (83.1 kg)] 183 lb 3.2 oz (83.1 kg) (12/12 0500) Last BM Date: 07/18/13  Intake/Output from previous day: 12/11 0701 - 12/12 0700 In: 4856.5 [I.V.:2514; Blood:242.5; NG/GT:140; IV Piggyback:1000; TPN:960] Out: 1800 [Urine:1050; Emesis/NG output:750]  Exam: HEENT - clear, not icteric Neck - soft Chest - coarse bilaterally Cor - rate 60's Abd - soft, mild distension; midline wound dry and intact  Lab Results:   Recent Labs  07/24/13 0346 07/25/13 0330 07/25/13 0431  WBC 9.9 11.3*  --   HGB 8.8* 9.9*  --   HCT 26.0* 30.4*  --   PLT 42* 16* 19*     Recent Labs  07/24/13 0346 07/25/13 0330  NA 143 142  K 3.2* 3.4*  CL 110 111  CO2 25 26  GLUCOSE 125* 128*  BUN 33* 30*  CREATININE 1.08 0.97  CALCIUM 7.2* 7.1*    Studies/Results: Dg Chest Port 1 View  07/25/2013   CLINICAL DATA:  Respiratory failure and bowel resection.  EXAM: PORTABLE CHEST - 1 VIEW  COMPARISON:  07/24/2013  FINDINGS: Endotracheal tube tip approximately 4.5 cm above the carina. Central line positioning is stable. Lungs show improved aeration of both lower lobes. Underlying chronic lung disease is stable.  IMPRESSION: Improved aeration of both lower lobes.   Electronically Signed   By: Irish Lack M.D.   On: 07/25/2013 07:00   Dg Chest Port 1 View  07/24/2013   CLINICAL DATA:  Endotracheal tube position. Decreased oxygen  saturations.  EXAM: PORTABLE CHEST - 1 VIEW  COMPARISON:  07/23/2013  FINDINGS: Endotracheal tube is in place with tip likely just above the carina. Consider withdrawing endotracheal tube approximately 2 cm. Nasogastric tube is in place with tip off the film but beyond the lower esophagus. Left IJ central line tip overlies the level of the superior vena cava. Bilateral lower lobe infiltrates/effusions again noted.  IMPRESSION: 1. Endotracheal tube tip just above the carina. 2. Bilateral lower lobe opacities, similar in appearance to prior study. Findings are consistent with infiltrates and effusions.   Electronically Signed   By: Rosalie Gums M.D.   On: 07/24/2013 01:57    Assessment / Plan: 1.  Status post massive small bowel resection for ischemia and infarction  NPO  TNA  Not acidotic, lactate normal, hypokalemic - no sign of further bowel ischemia at present  Per CCM and medical service.  Will follow with you.  Velora Heckler, MD, [facility] Office: [phone]  07/25/2013

## 2013-07-25 NOTE — Progress Notes (Signed)
PARENTERAL NUTRITION CONSULT NOTE  Pharmacy Consult for TPN Indication: massive bowel resection  No Known Allergies  Patient Measurements: Height: 6' (182.9 cm) Weight: 183 lb 3.2 oz (83.1 kg) IBW/kg (Calculated) : 77.6  Vital Signs: Temp: 97.6 F (36.4 C) (12/12 0758) Temp src: Oral (12/12 0758) BP: 118/52 mmHg (12/12 0800) Pulse Rate: 67 (12/12 0800) Intake/Output from previous day: 12/11 0701 - 12/12 0700 In: 4856.5 [I.V.:2514; Blood:242.5; NG/GT:140; IV Piggyback:1000; TPN:960] Out: 1800 [Urine:1050; Emesis/NG output:750] Intake/Output from this shift: Total I/O In: 323.1 [I.V.:43.1; IV Piggyback:200; TPN:80] Out: 100 [Urine:100]  Labs:  Recent Labs  07/23/13 0403 07/24/13 0346 07/25/13 0330 07/25/13 0431  WBC 12.5* 9.9 11.3*  --   HGB 9.0* 8.8* 9.9*  --   HCT 26.9* 26.0* 30.4*  --   PLT 54* 42* 16* 19*  APTT  --   --   --  44*  INR  --  1.60*  --  1.59*     Recent Labs  07/23/13 0403 07/24/13 0346 07/25/13 0330  NA 143 143 142  K 3.1* 3.2* 3.4*  CL 106 110 111  CO2 26 25 26   GLUCOSE 90 125* 128*  BUN 30* 33* 30*  CREATININE 1.05 1.08 0.97  CALCIUM 7.3* 7.2* 7.1*  MG  --  1.7 1.7  PHOS  --  2.1* 2.9  PROT 4.1* 3.6* 3.7*  ALBUMIN 1.8* 1.5* 1.6*  AST 2084* 818* 277*  ALT 1268* 903* 694*  ALKPHOS 151* 152* 178*  BILITOT 2.2* 3.3* 3.5*  PREALBUMIN  --  <3.0*  --   TRIG  --  210*  --    Estimated Creatinine Clearance: 71.1 ml/min (by C-G formula based on Cr of 0.97).    Recent Labs  07/25/13 0004 07/25/13 0431 07/25/13 0801  GLUCAP 119* 116* 124*    Insulin Requirements in the past 24 hours:  1 units SSI, CBGs all < 150  Current Nutrition:  Clinimix E 5/15 at 85mL/hr and lipid 20% emulsion at 68mL/hr- this is providing 36g protein and 1008kcal  Nutritional Goals:  1650-1800 kCal, 110-120 grams of protein per day per RD recommendations 12/10  Admit: [name] admitted with N/V, dehydration, abdominal pain and enteritis d/t norovirus.  Found to have mesenteric ischemia and infarcted small bowel. Underwent small bowel resection-majority of jejunum and proximal ileum- currently has minimal amount of small bowel needed to survive. Patient is DNR but continuing full medical care.  GI: 12/7 EGD showed changes suggestive of severe erosive ulcerative gastritis, duodenitis and jujinitis - 12/7 CT angiogram suggested diffuse calcifications of mesenteric circulation; 12/8 massive bowel resection - majority of jejunum and proximal ileum. 12/9 resection of terminal ileuim. Two anastomoses to re-establish intestinal continuity. has OG tube- bilious output- about 750 mL in 24 hours  Endo: CBGs all < 150.  Lytes: Na 142, K 3.4 after 45 meq K yesterday (via kphos), given 2 runs this am. , Phos 2.9 after potassium phosphate yesterday.  Corrected Ca 9, Mag 1.7 - RN to call e-link for mag replacement orders  Renal: SCr 0.97, UOP ~0.5 mL/kg/hr  Hepatobil: shocked liver- AST down to 277 from 818, ALT down to 694 from 903- improving, alk phos 178, Tbili 3.5 albumin 1.6, prealbumin <3, triglycerides 210  TPN Access: left IJ- triple lumen (placed 12/8)  TPN day#:3  Plan:  1. Increase Clinimix E 5/15 to 60 mL/hr.  This will provide 72 gm protein and 1502 kcals.  Will utilize full multivitamin and trace elements. (If patient  becomes jaundiced, will need to reduce to half trace elements.) Goal rate will be 103mL/hr- this will provide 100g protein and 2025 kcal daily- would meet 90% of protein needs and >100% of kcal needs. D/t premixed Clinimix solutions, unable to provide more protein without providing significantly more kcal than needed. GFR of goal rate would be 2.7mg /kg/min 2. Lipid emulsion 20% at 89mL/hr 3. NS to decrease to 52mL/hr 4. Continue CBGs and sensitive SSI q4 5. BMET, phosphorous and magnesium tomorrow morning. 6. K replaced by E-link. Will order magnesium 2 gm bolus for mag 1.7 7.  Follow along for clinical progression,  prognosis, ability to tolerate TPN [name], Pharm.D. [phone] 07/25/2013 9:15 AM

## 2013-07-25 NOTE — Op Note (Signed)
OPERATIVE REPORT  DATE OF SURGERY: 07/25/2013  PATIENT: [name], 78 y.o. male MRN: [id number]  DOB: June 10, 1935  PRE-OPERATIVE DIAGNOSIS: Ischemia right lower extremity  POST-OPERATIVE DIAGNOSIS:  Same  PROCEDURE: Right femoral thrombectomy of common femoral and profundus femoris artery  SURGEON:  Gretta Began, M.D.  PHYSICIAN ASSISTANT: Rhyne  ANESTHESIA:  Gen.  EBL: 100 ml  Total I/O In: 323.1 [I.V.:43.1; IV Piggyback:200; TPN:80] Out: 100 [Urine:100]  BLOOD ADMINISTERED: None  DRAINS: None  SPECIMEN: Femoral thrombus  COUNTS CORRECT:  YES  PLAN OF CARE: Surgical intensive care unit   PATIENT DISPOSITION:  PACU - hemodynamically stable  PROCEDURE DETAILS: Patient is a very ill gentleman who had floor for laparotomy and major bowel resection on Monday. As was 4 days ago. He had a CT angiogram showing severe mesenteric ischemia. He underwent arteriogram urgently that morning showing no evidence of endovascular options for treatment. He was taken emergency to the operating room after resuscitation. He mottling of his right leg at the time of surgery but was on inotropes or pressure. He does have known severe lower surety arterial insufficiency. He had stabilized over the subsequent several days. He does remain intubated. He did have motor function in his right foot but had a ongoing ischemic changes in his right foot. It was recommended he be taken to the operating room for exploration. This morning he had worsening clinical status regarding his foot with pain and cyanosis which was not present yesterday. I discussed this at length with the family and recommended groin expiration. I explained that it did not fill he was candidate for more invasive extensive procedure such as bypass. I felt that it was worthwhile to see if he had easily correctable problem and his groin.  The patient was taken to the operating room placed supine position where the area of the right groin  right leg prepped in sterile fashion. An incision was made through an old scar in the right groin and carried down to isolate the common superficial femoral and profundus femoris arteries. The patient had an old Gore-Tex femoropopliteal bypass as well. The patient was coagulopathic and therefore was not given any heparin. The common femoral and superficial femoral and function was arteries were occluded. The artery was opened through the prior hood of the existing Gore-Tex graft. The Gore-Tex graft was chronically occluded. A 4 Fogarty catheter was passed and there was nothing but old organized clot in the graft. There was thrombus in the common femoral artery as well.. There prior endarterectomy in the common femoral artery. There was inflow from the external iliac. For Port-A-Cath was passed up to the level of the aorta and withdrawn. The patient had extensive known iliac occlusive disease and no clot was removed from the proximal inflow. This was reoccluded. Thrombus in the common femoral artery was removed. The superficial femoral artery was chronically occluded. The cath was passed down the profundus femoris artery and subacute clot was removed with excellent back bleeding from the profundus femoris artery. After a negative pass this was reoccluded. The incision the common femoral artery was closed by reclosing the Gore-Tex material and common femoral artery with a running 5-0 Prolene suture. The usual flushing maneuvers were undertaken anastomosis was completed and flow restored flow to the profundus femoris artery. Excellent Doppler flow was noted in the profunda. There was no flow noted in the foot. The wounds were irrigated with saline. Hemostasis tablet cautery. Wounds closed with several layers of 2-0 Vicryl in  the subcutaneous tissue. The skin was closed with a 3-0 subcuticular Vicryl stitch. Sterile dressing was applied the patient was taken to the surgical intensive care unit in critical  condition   Gretta Began, M.D. 07/25/2013 11:59 AM

## 2013-07-25 NOTE — Progress Notes (Signed)
MD aware of second elevated troponin and cold right foot with no pulse found by doppler.

## 2013-07-25 NOTE — Progress Notes (Signed)
PULMONARY  / CRITICAL CARE MEDICINE  Name: [name] MRN: [id number] DOB: 1935/05/20    ADMISSION DATE:  07/18/2013 CONSULTATION DATE: 12-8  REFERRING MD :  [facility] PRIMARY SERVICE: PCCM  CHIEF COMPLAINT:  ARF  BRIEF PATIENT DESCRIPTION:  78 yo male with ischemic cardiomyopathy, severe PAD admitted with abdominal pain and CT abd demonstrates extensive ischemia changes, small bowel distension and was found to be in shock and respiratory failure and was urgently intubated and transferred to ICU.   SIGNIFICANT EVENTS / STUDIES:  12-8 intuabted, shock, surgical evaluation 12-8 DNR established  12/8 Exploratory laparotomy with entrectomy (majority of jejunum and proximal ileum) 12/9 Segmental small bowel resection terminal ileum  Two anastomoses to re-establish intestinal continuity 12/11 requires higher PEEP/FIO2    LINES / TUBES: 12- 8 OTT>> 12-8 Rt fem cvl>>12-8 12-8 Lt IJ cvl>>  CULTURES: 12-8 bc x 2>>ng 12-8 sputum>>e coli S to ceftx / zosyn  ANTIBIOTICS: 12-8 vanc>> 12-8 diflucan>> 12-8 pip tazo>>  HISTORY OF PRESENT ILLNESS:  78 yr old presented with Nausea and vomiting in adult with evidence of duodenitis on CT scan causing likely due to partial duodenal obstruction on CT scan ( recent Endoscopy 11-26: Three medium sized gastric antral ulcers. Single, large, gastric fundus ulcer , Angiodysplastic lesion in the gastric antrum & Duodenitis) . He had recently been admitted twice in the last 1 month once for atrial fibrillation and norvirus antritis and one week later for antritis and peptic ulcer disease.  Initial CT scan suggested mechanical duodenal obstruction versus duodenal inflammation. Was treated conservatively with bowel rest, PPI, GI was consulted. Underwent EGD on 07/20/2013 with changes were suggestive of severe erosive ulcerative gastritis, duodenitis and jujinitis - subsequently CT angiogram was ordered on 07/20/2013, suggesting that patient had diffuse  calcifications of his mesenteric circulation in that his CT scan look worse for small bowel inflammation/ was suspiciou necrosis now. Found with shock and respiratory failure and was urgently intubated and transferred to ICU.    SUBJECTIVE:  sedated on fent gtt but arouses easy Afebrile   VITAL SIGNS: Temp:  [97.4 F (36.3 C)-99.3 F (37.4 C)] 97.6 F (36.4 C) (12/12 0758) Pulse Rate:  [61-85] 69 (12/12 0954) Resp:  [9-25] 16 (12/12 0954) BP: (91-138)/(41-55) 138/45 mmHg (12/12 0954) SpO2:  [93 %-100 %] 100 % (12/12 0954) Arterial Line BP: (101-144)/(41-85) 133/54 mmHg (12/12 0800) FiO2 (%):  [40 %-60 %] 40 % (12/12 0954) Weight:  [83.1 kg (183 lb 3.2 oz)] 83.1 kg (183 lb 3.2 oz) (12/12 0500) HEMODYNAMICS: CVP:  [6 mmHg-9 mmHg] 7 mmHg VENTILATOR SETTINGS: Vent Mode:  [-] PSV FiO2 (%):  [40 %-60 %] 40 % Set Rate:  [15 bmp] 15 bmp Vt Set:  [620 mL] 620 mL PEEP:  [5 cmH20-10 cmH20] 5 cmH20 Pressure Support:  [8 cmH20] 8 cmH20 Plateau Pressure:  [21 cmH20-24 cmH20] 24 cmH20 INTAKE / OUTPUT: Intake/Output     12/11 0701 - 12/12 0700 12/12 0701 - 12/13 0700   I.V. (mL/kg) 2514 (30.3) 43.1 (0.5)   Blood 242.5    NG/GT 140    IV Piggyback 1000 200   TPN 960 80   Total Intake(mL/kg) 4856.5 (58.4) 323.1 (3.9)   Urine (mL/kg/hr) 1050 (0.5) 100 (0.4)   Emesis/NG output 750 (0.4)    Total Output 1800 100   Net +3056.5 +223.1          PHYSICAL EXAMINATION: General:  Elderly WM in no distress and intubated,  Neuro: Follows commands , RASS -  1 HEENT: Edentulous, No JVD/LAN  Cardiovascular:  HSR RRR Lungs:  Decreased BS BL Abdomen:  Tense/Tender, OGT with bilious drainage Musculoskeletal:  Wasted musculature Skin:  Rt lower ext  Cool below knee, dusky toes  LABS:  CBC  Recent Labs Lab 07/23/13 0403 07/24/13 0346 07/25/13 0330 07/25/13 0431  WBC 12.5* 9.9 11.3*  --   HGB 9.0* 8.8* 9.9*  --   HCT 26.9* 26.0* 30.4*  --   PLT 54* 42* 16* 19*   Coag's  Recent  Labs Lab 07/21/13 1010 07/22/13 0800 07/24/13 0346 07/25/13 0431  APTT 40* 40*  --  44*  INR 2.49* 2.29* 1.60* 1.59*   BMET  Recent Labs Lab 07/23/13 0403 07/24/13 0346 07/25/13 0330  NA 143 143 142  K 3.1* 3.2* 3.4*  CL 106 110 111  CO2 26 25 26   BUN 30* 33* 30*  CREATININE 1.05 1.08 0.97  GLUCOSE 90 125* 128*   Electrolytes  Recent Labs Lab 07/19/13 0400  07/23/13 0403 07/24/13 0346 07/25/13 0330  CALCIUM 8.6  < > 7.3* 7.2* 7.1*  MG 1.7  --   --  1.7 1.7  PHOS 2.7  --   --  2.1* 2.9  < > = values in this interval not displayed. Sepsis Markers  Recent Labs Lab 07/18/13 1512 07/21/13 1010 07/24/13 0900  LATICACIDVEN 1.3 10.5* 1.5   ABG  Recent Labs Lab 07/23/13 0409 07/24/13 0129 07/24/13 0905  PHART 7.500* 7.461* 7.383  PCO2ART 35.0 34.8* 42.0  PO2ART 66.0* 51.0* 169.0*   Liver Enzymes  Recent Labs Lab 07/23/13 0403 07/24/13 0346 07/25/13 0330  AST 2084* 818* 277*  ALT 1268* 903* 694*  ALKPHOS 151* 152* 178*  BILITOT 2.2* 3.3* 3.5*  ALBUMIN 1.8* 1.5* 1.6*   Cardiac Enzymes  Recent Labs Lab 07/18/13 1424  07/24/13 0900 07/24/13 1800 07/25/13 0330  TROPONINI  --   < > 2.22* 1.54* 1.22*  PROBNP 4170.0*  --   --   --   --   < > = values in this interval not displayed. Glucose  Recent Labs Lab 07/24/13 1150 07/24/13 1548 07/24/13 2025 07/25/13 0004 07/25/13 0431 07/25/13 0801  GLUCAP 171* 117* 112* 119* 116* 124*    Imaging Dg Chest Port 1 View  07/25/2013   CLINICAL DATA:  Respiratory failure and bowel resection.  EXAM: PORTABLE CHEST - 1 VIEW  COMPARISON:  07/24/2013  FINDINGS: Endotracheal tube tip approximately 4.5 cm above the carina. Central line positioning is stable. Lungs show improved aeration of both lower lobes. Underlying chronic lung disease is stable.  IMPRESSION: Improved aeration of both lower lobes.   Electronically Signed   By: Irish Lack M.D.   On: 07/25/2013 07:00   Dg Chest Port 1  View  07/24/2013   CLINICAL DATA:  Endotracheal tube position. Decreased oxygen saturations.  EXAM: PORTABLE CHEST - 1 VIEW  COMPARISON:  07/23/2013  FINDINGS: Endotracheal tube is in place with tip likely just above the carina. Consider withdrawing endotracheal tube approximately 2 cm. Nasogastric tube is in place with tip off the film but beyond the lower esophagus. Left IJ central line tip overlies the level of the superior vena cava. Bilateral lower lobe infiltrates/effusions again noted.  IMPRESSION: 1. Endotracheal tube tip just above the carina. 2. Bilateral lower lobe opacities, similar in appearance to prior study. Findings are consistent with infiltrates and effusions.   Electronically Signed   By: Rosalie Gums M.D.   On: 07/24/2013 01:57  CXR: 12/12 improved  BL infx  ASSESSMENT / PLAN:  PULMONARY A:Acute resp failure- worsening hypoxia/ALI 12/11 G neg- aspn pneumonia COPD with tobacco abuse hx Resp + metab alkalosis P:   Vent bundle BD's Hold SBTs     CARDIOVASCULAR A: Shock presumed sepsis, SIRS response      CAD/ isch cardiomyopathy -EF 35%      Vasculopath -ischemic RLE      NSTEMI P:   femoral endarterectomy planned, d/w Dr Arbie Cookey Plavix held for low plts & cannot use gut   RENAL A:  Acidosis, hyperkalemia, mild ARF, h/o ras Now Hypokalemic P:   Off bicarb gtt Avoid acei/ contrast  GASTROINTESTINAL A:  SBO/Ischemia s/p extensive SB resection Shock liver -LFTs improving H/o antral ulcers P:   CCS following ct TNA     HEMATOLOGIC A:  Thrombocytopenia Coagulopathy-  With low fibrinogen, concern for low grade DIC picture P:  scds  Transfuse cryo & plts in anticipation of surgery   INFECTIOUS A:  R/o bactereial translocation, at risk perf P:   ct zosyn Continue vanc/ diflucan Dc flagyl   ENDOCRINE A:  Hypoglycemia, cortisol > 150 P:   SSI Dc stress steroids  NEUROLOGIC A:  Lethargy most likely secondary to metabolic state P:   Daily  WUA fent  TODAY'S SUMMARY: Guarded prognosis given extensive bowel ischemia & RLE ischemia 12/12 Extensive discussion with family x 30 mins with vascular surgery present - plan femoral exploration as a limb salvage measure , guarded prognosis given, answered all questions DNR established but ct full medical care   Care during the described time interval was provided by me and/or other providers on the critical care team.  I have reviewed this patient's available data, including medical history, events of note, physical examination and test results as part of my evaluation  CC time x  60 m  Lilian Fuhs V. [phone]  07/25/2013, 10:22 AM

## 2013-07-25 NOTE — Progress Notes (Signed)
eLink Physician-Brief Progress Note Patient Name: [name] DOB: 1934/12/17 MRN: [id number]  Date of Service  07/25/2013   HPI/Events of Note   Thrombocytopenia, severe Likely not HIT On cilostazol  eICU Interventions  Hold cilostazol Transfuse 1 U PLT DIC panel, HIT panel   Intervention Category Intermediate Interventions: Thrombocytopenia - evaluation and management  MCQUAID, DOUGLAS 07/25/2013, 4:31 AM

## 2013-07-25 NOTE — Anesthesia Postprocedure Evaluation (Signed)
  Anesthesia Post-op Note  Patient: [name]  Procedure(s) Performed: Procedure(s): THROMBECTOMY OF  FEMORAL ARTERY (Right)  Patient Location: SICU  Anesthesia Type:General  Level of Consciousness: sedated and Patient remains intubated per anesthesia plan  Airway and Oxygen Therapy: Patient Spontanous Breathing  Post-op Pain: mild  Post-op Assessment: Post-op Vital signs reviewed  Post-op Vital Signs: stable  Complications: No apparent anesthesia complications

## 2013-07-25 NOTE — Progress Notes (Signed)
NUTRITION FOLLOW UP  DOCUMENTATION CODES Per approved criteria  -Severe malnutrition in the context of chronic illness   Intervention:    TPN per pharmacy RD to follow for nutrition care plan  Nutrition Dx:   Inadequate oral intake now related to inability to eat as evidenced by NPO status, ongoing  Goal:   TPN to meet > 90% of estimated nutrition needs, progressing  Monitor:   TPN presciption, respiratory status, weight, labs, I/O's  Assessment:   Patient with PMH of COPD, CVA and CAD; previous admitted with enteritis (thought to be Norovirus); presented again with ongoing nausea, occasional vomiting and one loose BM per day; + severe fatigue and abdominal pain  Patient s/p emergent procedure 12/8: EXPLORATORY LAPAROTOMY ENTRECTOMY (MAJORITY OF JEJUNUM & PROXIMAL ILEUM)  Patient is currently intubated on ventilator support -- OGT in place  MV: 10.4 Temp (24hrs), Avg:98.2 F (36.8 C), Min:97.4 F (36.3 C), Max:99.3 F (37.4 C)   Patient continues to receive TPN with Clinimix E 5/15 @ 30 ml/hr and lipids @ 10 ml/hr. Provides 991 kcal and 36 grams protein per day. Meets 60% minimum estimated energy needs and 33% minimum estimated protein needs.  Noted patient exhibiting signs of refeeding (12/11:  Phos at 2.1 mg/dL, 14/78:  Potassium 3.2 mEq/L)  Height: Ht Readings from Last 1 Encounters:  07/18/13 6' (1.829 m)    Weight Status:   Wt Readings from Last 1 Encounters:  07/25/13 183 lb 3.2 oz (83.1 kg)    Body mass index is 24.84 kg/(m^2).  Re-estimated needs:  Kcal: 1650-1800 Protein: 110-120 gm Fluid: per MD  Skin: Stage II pressure ulcer to sacrum  Diet Order: NPO   Intake/Output Summary (Last 24 hours) at 07/25/13 0858 Last data filed at 07/25/13 0815  Gross per 24 hour  Intake 4750.88 ml  Output   1820 ml  Net 2930.88 ml    Labs:   Recent Labs Lab 07/19/13 0400  07/23/13 0403 07/24/13 0346 07/25/13 0330  NA 135  < > 143 143 142  K 3.4*  <  > 3.1* 3.2* 3.4*  CL 98  < > 106 110 111  CO2 25  < > 26 25 26   BUN 21  < > 30* 33* 30*  CREATININE 0.87  < > 1.05 1.08 0.97  CALCIUM 8.6  < > 7.3* 7.2* 7.1*  MG 1.7  --   --  1.7 1.7  PHOS 2.7  --   --  2.1* 2.9  GLUCOSE 81  < > 90 125* 128*  < > = values in this interval not displayed.  CBG (last 3)   Recent Labs  07/25/13 0004 07/25/13 0431 07/25/13 0801  GLUCAP 119* 116* 124*    Scheduled Meds: . amiodarone  200 mg Oral Daily  . antiseptic oral rinse  15 mL Mouth Rinse QID  . chlorhexidine  15 mL Mouth Rinse BID  . fluconazole (DIFLUCAN) IV  200 mg Intravenous Q24H  . insulin aspart  0-9 Units Subcutaneous Q4H  . metronidazole  500 mg Intravenous Q8H  . pantoprazole (PROTONIX) IV  40 mg Intravenous Q12H  . piperacillin-tazobactam (ZOSYN)  IV  3.375 g Intravenous Q8H  . sodium chloride  10 mL Intravenous Q12H  . sodium chloride  3 mL Intravenous Q12H  . sodium chloride  3 mL Intravenous Q12H  . vancomycin  1,000 mg Intravenous Q12H    Continuous Infusions: . sodium chloride 90 mL/hr at 07/25/13 0600  . Marland KitchenTPN (CLINIMIX-E) Adult 30 mL/hr  at 07/25/13 0800   And  . fat emulsion 10 kcal (07/25/13 0800)  . fentaNYL infusion INTRAVENOUS 50 mcg/hr (07/25/13 0815)  . norepinephrine (LEVOPHED) Adult infusion 2 mcg/min (07/25/13 0900)    Maureen Chatters, RD, LDN Pager #: [phone] After-Hours Pager #: [phone]

## 2013-07-26 ENCOUNTER — Inpatient Hospital Stay (HOSPITAL_COMMUNITY): Payer: Medicare Other

## 2013-07-26 LAB — DIC (DISSEMINATED INTRAVASCULAR COAGULATION)PANEL
D-Dimer, Quant: >20 ug/mL-FEU — ABNORMAL HIGH (ref 0.00–0.48)
Fibrinogen: 194 mg/dL — ABNORMAL LOW (ref 204–475)
INR: 1.53 — ABNORMAL HIGH (ref 0.00–1.49)
Platelets: 32 10*3/uL — ABNORMAL LOW (ref 150–400)
Prothrombin Time: 18 seconds — ABNORMAL HIGH (ref 11.6–15.2)
aPTT: 45 seconds — ABNORMAL HIGH (ref 24–37)

## 2013-07-26 LAB — BLOOD GAS, ARTERIAL
Acid-Base Excess: 0.5 mmol/L (ref 0.0–2.0)
Bicarbonate: 24.4 mEq/L — ABNORMAL HIGH (ref 20.0–24.0)
MECHVT: 620 mL
O2 Saturation: 97.9 %
Patient temperature: 98.6
RATE: 15 resp/min
TCO2: 25.6 mmol/L (ref 0–100)
pH, Arterial: 7.421 (ref 7.350–7.450)

## 2013-07-26 LAB — COMPREHENSIVE METABOLIC PANEL
ALT: 393 U/L — ABNORMAL HIGH (ref 0–53)
Albumin: 1.5 g/dL — ABNORMAL LOW (ref 3.5–5.2)
Alkaline Phosphatase: 168 U/L — ABNORMAL HIGH (ref 39–117)
BUN: 22 mg/dL (ref 6–23)
CO2: 25 mEq/L (ref 19–32)
Chloride: 111 mEq/L (ref 96–112)
Creatinine, Ser: 0.79 mg/dL (ref 0.50–1.35)
GFR calc Af Amer: >90 mL/min (ref >90)
Glucose, Bld: 142 mg/dL — ABNORMAL HIGH (ref 70–99)
Potassium: 3.2 mEq/L — ABNORMAL LOW (ref 3.5–5.1)
Total Bilirubin: 2.8 mg/dL — ABNORMAL HIGH (ref 0.3–1.2)
Total Protein: 3.6 g/dL — ABNORMAL LOW (ref 6.0–8.3)

## 2013-07-26 LAB — GLUCOSE, CAPILLARY
Glucose-Capillary: 113 mg/dL — ABNORMAL HIGH (ref 70–99)
Glucose-Capillary: 127 mg/dL — ABNORMAL HIGH (ref 70–99)
Glucose-Capillary: 128 mg/dL — ABNORMAL HIGH (ref 70–99)
Glucose-Capillary: 136 mg/dL — ABNORMAL HIGH (ref 70–99)
Glucose-Capillary: 137 mg/dL — ABNORMAL HIGH (ref 70–99)

## 2013-07-26 LAB — PREPARE CRYOPRECIPITATE: Unit division: 0

## 2013-07-26 LAB — CBC
MCV: 89.9 fL (ref 78.0–100.0)
Platelets: 35 10*3/uL — ABNORMAL LOW (ref 150–400)
RBC: 3.08 MIL/uL — ABNORMAL LOW (ref 4.22–5.81)
RDW: 18 % — ABNORMAL HIGH (ref 11.5–15.5)
WBC: 11 10*3/uL — ABNORMAL HIGH (ref 4.0–10.5)

## 2013-07-26 LAB — MAGNESIUM: Magnesium: 1.9 mg/dL (ref 1.5–2.5)

## 2013-07-26 LAB — PREPARE PLATELET PHERESIS: Unit division: 0

## 2013-07-26 LAB — CK: Total CK: 639 U/L — ABNORMAL HIGH (ref 7–232)

## 2013-07-26 MED ORDER — TRACE MINERALS CR-CU-F-FE-I-MN-MO-SE-ZN IV SOLN
INTRAVENOUS | Status: AC
  Administered 2013-07-26: 18:00:00 via INTRAVENOUS
  Filled 2013-07-26: qty 2000

## 2013-07-26 MED ORDER — FAT EMULSION 20 % IV EMUL
240.0000 mL | INTRAVENOUS | Status: AC
  Administered 2013-07-26: 240 mL via INTRAVENOUS
  Filled 2013-07-26: qty 250

## 2013-07-26 MED ORDER — POTASSIUM PHOSPHATE DIBASIC 3 MMOLE/ML IV SOLN
30.0000 mmol | Freq: Once | INTRAVENOUS | Status: AC
  Administered 2013-07-26: 30 mmol via INTRAVENOUS
  Filled 2013-07-26: qty 10

## 2013-07-26 NOTE — Progress Notes (Addendum)
PARENTERAL NUTRITION CONSULT NOTE  Pharmacy Consult for TPN Indication: massive bowel resection  No Known Allergies  Patient Measurements: Height: 6' (182.9 cm) Weight: 189 lb 6 oz (85.9 kg) IBW/kg (Calculated) : 77.6  Vital Signs: Temp: 98.2 F (36.8 C) (12/13 0757) Temp src: Oral (12/13 0757) BP: 132/48 mmHg (12/13 0945) Pulse Rate: 69 (12/13 0945) Intake/Output from previous day: 12/12 0701 - 12/13 0700 In: 3865.6 [I.V.:1797.6; NG/GT:30; IV Piggyback:800; TPN:1238] Out: 1530 [Urine:1480; Emesis/NG output:50] Intake/Output from this shift: Total I/O In: 216.3 [I.V.:16.3; TPN:200] Out: 100 [Urine:100]  Labs:  Recent Labs  07/24/13 0346 07/25/13 0330 07/25/13 0431 07/25/13 1000 07/25/13 1100 07/26/13 0430  WBC 9.9 11.3*  --   --   --  11.0*  HGB 8.8* 9.9*  --   --   --  9.2*  HCT 26.0* 30.4*  --   --   --  27.7*  PLT 42* 16* 19* 43* 38* 35*  32*  APTT  --   --  44*  --  42* 45*  INR 1.60*  --  1.59*  --  1.58* 1.53*     Recent Labs  07/24/13 0346 07/25/13 0330 07/26/13 0430  NA 143 142 142  K 3.2* 3.4* 3.2*  CL 110 111 111  CO2 25 26 25   GLUCOSE 125* 128* 142*  BUN 33* 30* 22  CREATININE 1.08 0.97 0.79  CALCIUM 7.2* 7.1* 7.1*  MG 1.7 1.7 1.9  PHOS 2.1* 2.9 2.2*  PROT 3.6* 3.7* 3.6*  ALBUMIN 1.5* 1.6* 1.5*  AST 818* 277* 130*  ALT 903* 694* 393*  ALKPHOS 152* 178* 168*  BILITOT 3.3* 3.5* 2.8*  PREALBUMIN <3.0*  --   --   TRIG 210*  --   --    Estimated Creatinine Clearance: 86.2 ml/min (by C-G formula based on Cr of 0.79).    Recent Labs  07/26/13 0012 07/26/13 0346 07/26/13 0755  GLUCAP 136* 127* 128*    Insulin Requirements in the past 24 hours:  5 units SSI, CBGs all < 150  Current Nutrition:  Clinimix E 5/15 at 28mL/hr and lipid 20% emulsion at 7mL/hr- This is providing 72 gm protein and 1502 kcals.  Nutritional Goals:  1650-1800 kCal, 110-120 grams of protein per day per RD recommendations 12/12  Admit: 45 YOM admitted  with N/V, dehydration, abdominal pain and enteritis d/t norovirus. Found to have mesenteric ischemia and infarcted small bowel. Underwent small bowel resection-majority of jejunum and proximal ileum- currently has minimal amount of small bowel needed to survive. Patient is DNR but continuing full medical care.  GI: 12/7 EGD showed changes suggestive of severe erosive ulcerative gastritis, duodenitis and jujinitis - 12/7 CT angiogram suggested diffuse calcifications of mesenteric circulation; 12/8 massive bowel resection - majority of jejunum and proximal ileum. 12/9 resection of terminal ileuim. Two anastomoses to re-establish intestinal continuity. has OG tube-187mL of output charted in last 24 hours  Endo: CBGs all < 150.  Lytes: Na 142, K 3.2, Phos 2.2, Corrected Ca 9, Mag 1.9  Renal: SCr 0.79, UOP ~0.7 mL/kg/hr- picking up  Hepatobil: shocked liver- AST down to 130, ALT down to 393, alk phos 168, Tbili 2.8, albumin 1.5, prealbumin <3, triglycerides 210  Pulm: acute resp failure on vent; 40% FiO2, PEEP 5  Cards: chronic vascular disease- more flow in left foot than right which is baseline- s/p right femoral endarterectomy. Amputation may still be required at some point. Shock from presumed sepsis, NSTEMI- requiring Levophed, currently running at 65mcg/min. BP  132/48, HR 60-70s. Hx AFib on po amiodarone  Neuro: GCS 12, RASS -1 on fent drip   Heme: aPTT 45 seconds, PT 18, INR 1.53- improving. Patient has received vitamin K and FFP as reversal before surgeries. Hgb 9.2, plts 35. Does have ulcers and anticoagulation has been held in the past d/t GI bleed. Has low fibrinogen and high D-Dimer.   ID: high risk of perforation, r/o bacterial translocation. On vanc, Zosyn, fluconazole. WBC 11, currently afebrile. LA 1.1 yesterday  Best Practices: mouth care, SCDs  TPN Access: left IJ- triple lumen (placed 12/8)  TPN day#: 4  Plan:  1. Potassium phosphate to replete both phosphorous and K  (provides of potassium) 2. Continue Clinimix E 5/15 at 60 mL/hr and lipid 20% emulsion at 42mL/hr as patient is exhibiting signs of refeeding (decreased potassium and phosphorous). This will provide 72 gm protein and 1502 kcals.   3. Will utilize full multivitamin and trace elements. If patient becomes jaundiced, will need to reduce to half trace elements. 4. Goal rate of Clinimix E 5/15 will be 16mL/hr- this will provide 100g protein and 2025 kcal daily- would meet 90% of protein needs and >100% of kcal needs. D/t premixed Clinimix solutions, unable to provide more protein without providing significantly more kcal than needed. 5. Continue NS at 83mL/hr 6. BMET, magnesium and phosphorous tomorrow morning. Other TPN labs as ordered 7. Continue sensitive SSI and CBGs as ordered 8. Continue to follow for clinical progression, ability to tolerate TPN  Karie Skowron D. Zamariah Seaborn, PharmD, BCPS Clinical Pharmacist Pager: [phone]

## 2013-07-26 NOTE — Progress Notes (Addendum)
   Daily Progress Note  Assessment/Planning: POD #1 s/p R femoral endarterectomy, s/p subtotal bowel resection   No obvious perfusion to foot  Check CK/Myoglobin.  So far Cr ok but suspect some degree of muscle death.  Long-term survival more dependent on length of residual bowel than R foot  If long-term survival unlikely, no benefit to R AKA vs BKA.  Subjective  - 1 Day Post-Op  Nods to questions of pain R leg  Objective Filed Vitals:   07/26/13 0400 07/26/13 0500 07/26/13 0600 07/26/13 0700  BP: 121/42 127/45 120/42 123/43  Pulse: 63 66 63 64  Temp: 98.3 F (36.8 C)     TempSrc: Oral     Resp: 17 17 5 17   Height:      Weight:   189 lb 6 oz (85.9 kg)   SpO2: 98% 98% 97% 95%    Intake/Output Summary (Last 24 hours) at 07/26/13 0747 Last data filed at 07/26/13 0700  Gross per 24 hour  Intake 3642.38 ml  Output   1530 ml  Net 2112.38 ml   VASC  R foot mottled and cool, no dopplerable pedal signals, R groin c/d/i  Laboratory CBC    Component Value Date/Time   WBC 11.0* 07/26/2013 0430   HGB 9.2* 07/26/2013 0430   HCT 27.7* 07/26/2013 0430   PLT 35* 07/26/2013 0430   PLT 32* 07/26/2013 0430    BMET    Component Value Date/Time   NA 142 07/26/2013 0430   K 3.2* 07/26/2013 0430   CL 111 07/26/2013 0430   CO2 25 07/26/2013 0430   GLUCOSE 142* 07/26/2013 0430   BUN 22 07/26/2013 0430   CREATININE 0.79 07/26/2013 0430   CALCIUM 7.1* 07/26/2013 0430   GFRNONAA 85* 07/26/2013 0430   GFRAA >90 07/26/2013 0430    Leonides Sake, MD [facility] Office: [phone] Pager: [phone]  07/26/2013, 7:47 AM

## 2013-07-26 NOTE — Progress Notes (Signed)
PULMONARY  / CRITICAL CARE MEDICINE  Name: [name] MRN: [id number] DOB: 05/31/1935    ADMISSION DATE:  07/18/2013 CONSULTATION DATE: 12-8  REFERRING MD :  [facility] PRIMARY SERVICE: PCCM  CHIEF COMPLAINT:  ARF  BRIEF PATIENT DESCRIPTION:  78 yo male with ischemic cardiomyopathy, severe PAD admitted with abdominal pain and CT abd demonstrates extensive ischemia changes, small bowel distension and was found to be in shock and respiratory failure and was urgently intubated and transferred to ICU.   SIGNIFICANT EVENTS / STUDIES:  12-8 intuabted, shock, surgical evaluation 12-8 DNR established  12/8 Exploratory laparotomy with entrectomy (majority of jejunum and proximal ileum) 12/9 Segmental small bowel resection terminal ileum  Two anastomoses to re-establish intestinal continuity 12/11 requires higher PEEP/FIO2 12/12 s/p R LE endarterectomy (Dr Early)    LINES / TUBES: 12- 8 OTT>> 12-8 Rt fem cvl>>12-8 12-8 Lt IJ cvl>>  CULTURES: 12-8 bc x 2>>ng 12-8 sputum>>e coli S to ceftx / zosyn  ANTIBIOTICS: 12-8 vanc>> 12-8 diflucan>> 12-8 pip tazo>>  HISTORY OF PRESENT ILLNESS:  78 yr old presented with Nausea and vomiting in adult with evidence of duodenitis on CT scan causing likely due to partial duodenal obstruction on CT scan ( recent Endoscopy 11-26: Three medium sized gastric antral ulcers. Single, large, gastric fundus ulcer , Angiodysplastic lesion in the gastric antrum & Duodenitis) . He had recently been admitted twice in the last 1 month once for atrial fibrillation and norvirus antritis and one week later for antritis and peptic ulcer disease.  Initial CT scan suggested mechanical duodenal obstruction versus duodenal inflammation. Was treated conservatively with bowel rest, PPI, GI was consulted. Underwent EGD on 07/20/2013 with changes were suggestive of severe erosive ulcerative gastritis, duodenitis and jujinitis - subsequently CT angiogram was ordered on 07/20/2013,  suggesting that patient had diffuse calcifications of his mesenteric circulation in that his CT scan look worse for small bowel inflammation/ was suspiciou necrosis now. Found with shock and respiratory failure and was urgently intubated and transferred to ICU.    SUBJECTIVE:  sedated on fent gtt but arouses easy Afebrile   VITAL SIGNS: Temp:  [97.9 F (36.6 C)-98.3 F (36.8 C)] 98.2 F (36.8 C) (12/13 0757) Pulse Rate:  [61-73] 71 (12/13 0900) Resp:  [5-26] 25 (12/13 0900) BP: (113-145)/(34-53) 132/48 mmHg (12/13 0900) SpO2:  [93 %-100 %] 95 % (12/13 0900) Arterial Line BP: (116-155)/(29-61) 133/40 mmHg (12/13 0900) FiO2 (%):  [40 %] 40 % (12/13 0300) Weight:  [85.9 kg (189 lb 6 oz)] 85.9 kg (189 lb 6 oz) (12/13 0600) HEMODYNAMICS: CVP:  [5 mmHg-9 mmHg] 9 mmHg VENTILATOR SETTINGS: Vent Mode:  [-] PRVC FiO2 (%):  [40 %] 40 % Set Rate:  [15 bmp] 15 bmp Vt Set:  [620 mL] 620 mL PEEP:  [5 cmH20] 5 cmH20 Pressure Support:  [8 cmH20] 8 cmH20 Plateau Pressure:  [17 cmH20-20 cmH20] 18 cmH20 INTAKE / OUTPUT: Intake/Output     12/12 0701 - 12/13 0700 12/13 0701 - 12/14 0700   I.V. (mL/kg) 1797.6 (20.9) 16.3 (0.2)   Blood     NG/GT 30    IV Piggyback 800    TPN 1238 200   Total Intake(mL/kg) 3865.6 (45) 216.3 (2.5)   Urine (mL/kg/hr) 1480 (0.7) 100 (0.4)   Emesis/NG output 50 (0)    Total Output 1530 100   Net +2335.6 +116.3          PHYSICAL EXAMINATION: General:  Elderly WM in no distress and intubated,  Neuro:  Follows commands , RASS 0 HEENT: Edentulous, No JVD/LAN  Cardiovascular:  HSR RRR Lungs:  Decreased BS BL Abdomen:  Tense/Tender, OGT with bilious drainage Musculoskeletal:  Wasted musculature Skin:  Rt lower ext  Cool below knee, dusky toes starting to demarcate   LABS:  CBC  Recent Labs Lab 07/24/13 0346 07/25/13 0330  07/25/13 1000 07/25/13 1100 07/26/13 0430  WBC 9.9 11.3*  --   --   --  11.0*  HGB 8.8* 9.9*  --   --   --  9.2*  HCT 26.0* 30.4*   --   --   --  27.7*  PLT 42* 16*  < > 43* 38* 35*  32*  < > = values in this interval not displayed. Coag's  Recent Labs Lab 07/25/13 0431 07/25/13 1100 07/26/13 0430  APTT 44* 42* 45*  INR 1.59* 1.58* 1.53*   BMET  Recent Labs Lab 07/24/13 0346 07/25/13 0330 07/26/13 0430  NA 143 142 142  K 3.2* 3.4* 3.2*  CL 110 111 111  CO2 25 26 25   BUN 33* 30* 22  CREATININE 1.08 0.97 0.79  GLUCOSE 125* 128* 142*   Electrolytes  Recent Labs Lab 07/24/13 0346 07/25/13 0330 07/26/13 0430  CALCIUM 7.2* 7.1* 7.1*  MG 1.7 1.7 1.9  PHOS 2.1* 2.9 2.2*   Sepsis Markers  Recent Labs Lab 07/21/13 1010 07/24/13 0900 07/25/13 1000  LATICACIDVEN 10.5* 1.5 1.1   ABG  Recent Labs Lab 07/24/13 0129 07/24/13 0905 07/26/13 0313  PHART 7.461* 7.383 7.421  PCO2ART 34.8* 42.0 38.3  PO2ART 51.0* 169.0* 96.6   Liver Enzymes  Recent Labs Lab 07/24/13 0346 07/25/13 0330 07/26/13 0430  AST 818* 277* 130*  ALT 903* 694* 393*  ALKPHOS 152* 178* 168*  BILITOT 3.3* 3.5* 2.8*  ALBUMIN 1.5* 1.6* 1.5*   Cardiac Enzymes  Recent Labs Lab 07/24/13 1800 07/25/13 0330 07/26/13 0430  TROPONINI 1.54* 1.22* 0.74*   Glucose  Recent Labs Lab 07/25/13 0801 07/25/13 1538 07/25/13 1948 07/26/13 0012 07/26/13 0346 07/26/13 0755  GLUCAP 124* 119* 136* 136* 127* 128*    Imaging Dg Chest Port 1 View  07/26/2013   CLINICAL DATA:  Followup pneumonia  EXAM: PORTABLE CHEST - 1 VIEW  COMPARISON:  07/25/2013  FINDINGS: A left-sided jugular central venous line is again noted in the proximal superior vena cava. The endotracheal tube and nasogastric catheter are stable in appearance and within normal position. The cardiac shadow is stable. Postsurgical changes are again noted. Increasing effusion is noted in the right base. The lungs are otherwise stable.  IMPRESSION: Increasing right basilar effusion.   Electronically Signed   By: Alcide Clever M.D.   On: 07/26/2013 07:21   Dg Chest Port  1 View  07/25/2013   CLINICAL DATA:  Respiratory failure and bowel resection.  EXAM: PORTABLE CHEST - 1 VIEW  COMPARISON:  07/24/2013  FINDINGS: Endotracheal tube tip approximately 4.5 cm above the carina. Central line positioning is stable. Lungs show improved aeration of both lower lobes. Underlying chronic lung disease is stable.  IMPRESSION: Improved aeration of both lower lobes.   Electronically Signed   By: Irish Lack M.D.   On: 07/25/2013 07:00    ASSESSMENT / PLAN:  PULMONARY A:Acute resp failure - worsening hypoxia/ALI 12/11 G neg- aspn pneumonia COPD with tobacco abuse hx Resp + metab alkalosis P:   BD's  Start PSV, pain and narc needs may preclude extubation 12/13   CARDIOVASCULAR A: Shock presumed sepsis, SIRS response  CAD/ isch cardiomyopathy -EF 35%      Vasculopath -ischemic RLE > s/p endarterectomy 12/12      NSTEMI P:  femoral endarterectomy performed 12/12 Plavix held for low plts   RENAL A:  Acidosis, hyperkalemia, mild ARF, h/o ras Now Hypokalemic P:   Off bicarb gtt Avoid acei/ contrast  GASTROINTESTINAL A:  SBO/Ischemia s/p extensive SB resection Shock liver -LFTs improving H/o antral ulcers P:   CCS following ct TNA     HEMATOLOGIC A:  Thrombocytopenia Coagulopathy-  With low fibrinogen, low grade DIC picture P:  scds  Follow CBC   INFECTIOUS A:  R/o bactereial translocation, at risk perf P:   ct zosyn Continue vanc/ diflucan   ENDOCRINE A:  Hypoglycemia, cortisol > 150 P:   SSI Dc stress steroids  NEUROLOGIC A:  Sedation, MS improved P:   Daily WUA fent for pain  TODAY'S SUMMARY: Guarded prognosis given extensive bowel ischemia & RLE ischemia 12/12 Extensive discussion with family x 30 mins with vascular surgery present - plan femoral exploration as a limb salvage measure , guarded prognosis given, answered all questions DNR established but ct full medical care   Care during the described time interval was  provided by me and/or other providers on the critical care team.  I have reviewed this patient's available data, including medical history, events of note, physical examination and test results as part of my evaluation  CC time x  35 m  Levy Pupa, MD, PhD 07/26/2013, 9:55 AM [facility] [phone] or if no answer [phone]

## 2013-07-26 NOTE — Progress Notes (Signed)
[facility] Progress Note 1 Day Post-Op  Subjective: Patient arousable on the ventilator.  Still on one pressor.  FIO2 40%  Objective: Vital signs in last 24 hours: Temp:  [97.9 F (36.6 C)-98.3 F (36.8 C)] 98.2 F (36.8 C) (12/13 0757) Pulse Rate:  [61-73] 67 (12/13 0800) Resp:  [5-26] 18 (12/13 0800) BP: (104-145)/(34-53) 121/44 mmHg (12/13 0800) SpO2:  [93 %-100 %] 95 % (12/13 0800) Arterial Line BP: (116-155)/(29-61) 116/34 mmHg (12/13 0800) FiO2 (%):  [40 %] 40 % (12/13 0300) Weight:  [85.9 kg (189 lb 6 oz)] 85.9 kg (189 lb 6 oz) (12/13 0600) Last BM Date: 07/18/13  Intake/Output from previous day: 12/12 0701 - 12/13 0700 In: 3865.6 [I.V.:1797.6; NG/GT:30; IV Piggyback:800; TPN:1238] Out: 1530 [Urine:1480; Emesis/NG output:50] Intake/Output this shift: Total I/O In: 216.3 [I.V.:16.3; TPN:200] Out: 100 [Urine:100]  General: No acute distress, but wife states that he is having pain in his right foot  Lungs: Clear  Abd: Soft, few bowel sounds.  Incision is clean and dry.  Extremities: Ischemic and pale right foot without Doppler evidence of vascular flow.  Neuro: On fentanyl drip.  Somewhat responsive.  Lab Results:  @LABLAST2 (wbc:2,hgb:2,hct:2,plt:2) BMET  Recent Labs  07/25/13 0330 07/26/13 0430  NA 142 142  K 3.4* 3.2*  CL 111 111  CO2 26 25  GLUCOSE 128* 142*  BUN 30* 22  CREATININE 0.97 0.79  CALCIUM 7.1* 7.1*   PT/INR  Recent Labs  07/25/13 1100 07/26/13 0430  LABPROT 18.4* 18.0*  INR 1.58* 1.53*   ABG  Recent Labs  07/24/13 0905 07/26/13 0313  PHART 7.383 7.421  HCO3 25.1* 24.4*    Studies/Results: Dg Chest Port 1 View  07/26/2013   CLINICAL DATA:  Followup pneumonia  EXAM: PORTABLE CHEST - 1 VIEW  COMPARISON:  07/25/2013  FINDINGS: A left-sided jugular central venous line is again noted in the proximal superior vena cava. The endotracheal tube and nasogastric catheter are stable in appearance and within normal position. The  cardiac shadow is stable. Postsurgical changes are again noted. Increasing effusion is noted in the right base. The lungs are otherwise stable.  IMPRESSION: Increasing right basilar effusion.   Electronically Signed   By: Alcide Clever M.D.   On: 07/26/2013 07:21   Dg Chest Port 1 View  07/25/2013   CLINICAL DATA:  Respiratory failure and bowel resection.  EXAM: PORTABLE CHEST - 1 VIEW  COMPARISON:  07/24/2013  FINDINGS: Endotracheal tube tip approximately 4.5 cm above the carina. Central line positioning is stable. Lungs show improved aeration of both lower lobes. Underlying chronic lung disease is stable.  IMPRESSION: Improved aeration of both lower lobes.   Electronically Signed   By: Irish Lack M.D.   On: 07/25/2013 07:00    Anti-infectives: Anti-infectives   Start     Dose/Rate Route Frequency Ordered Stop   07/25/13 1115  vancomycin (VANCOCIN) IVPB 1000 mg/200 mL premix     1,000 mg 200 mL/hr over 60 Minutes Intravenous To Surgery 07/25/13 1108 07/25/13 1111   07/22/13 2200  vancomycin (VANCOCIN) IVPB 1000 mg/200 mL premix     1,000 mg 200 mL/hr over 60 Minutes Intravenous Every 12 hours 07/22/13 2056     07/21/13 1000  fluconazole (DIFLUCAN) IVPB 200 mg     200 mg 100 mL/hr over 60 Minutes Intravenous Every 24 hours 07/21/13 0944     07/21/13 0945  metroNIDAZOLE (FLAGYL) IVPB 500 mg  Status:  Discontinued     500 mg 100 mL/hr over  60 Minutes Intravenous Every 8 hours 07/21/13 0933 07/25/13 0918   07/21/13 0945  piperacillin-tazobactam (ZOSYN) IVPB 3.375 g     3.375 g 12.5 mL/hr over 240 Minutes Intravenous 3 times per day 07/21/13 0944     07/21/13 0800  vancomycin (VANCOCIN) IVPB 1000 mg/200 mL premix  Status:  Discontinued     1,000 mg 200 mL/hr over 60 Minutes Intravenous Every 12 hours 07/21/13 0626 07/22/13 1045   07/21/13 0615  ceFEPIme (MAXIPIME) 1 g in dextrose 5 % 50 mL IVPB  Status:  Discontinued     1 g 100 mL/hr over 30 Minutes Intravenous 3 times per day 07/21/13  1610 07/21/13 0919      Assessment/Plan: s/p Procedure(s): THROMBECTOMY OF  FEMORAL ARTERY No chan ges May be able to wean off the Levophed.  May be able to wean off the vent.  No plans to feed currently.  LOS: 8 days   Marta Lamas. Gae Bon, MD, FACS [phone] [facility] Surgery 07/26/2013

## 2013-07-26 NOTE — Progress Notes (Signed)
Ok,to come off contact precautions as per Cherri with infection prevention.  Also advised Dr. Lindie Spruce at bedside.

## 2013-07-27 ENCOUNTER — Inpatient Hospital Stay (HOSPITAL_COMMUNITY): Payer: Medicare Other

## 2013-07-27 DIAGNOSIS — K929 Disease of digestive system, unspecified: Secondary | ICD-10-CM

## 2013-07-27 LAB — GLUCOSE, CAPILLARY
Glucose-Capillary: 115 mg/dL — ABNORMAL HIGH (ref 70–99)
Glucose-Capillary: 132 mg/dL — ABNORMAL HIGH (ref 70–99)
Glucose-Capillary: 105 mg/dL — ABNORMAL HIGH (ref 70–99)
Glucose-Capillary: 135 mg/dL — ABNORMAL HIGH (ref 70–99)

## 2013-07-27 LAB — POCT I-STAT 3, ART BLOOD GAS (G3+)
Acid-base deficit: 2 mmol/L (ref 0.0–2.0)
O2 Saturation: 90 %
TCO2: 25 mmol/L (ref 0–100)
pCO2 arterial: 42.8 mmHg (ref 35.0–45.0)
pH, Arterial: 7.353 (ref 7.350–7.450)
pO2, Arterial: 61 mmHg — ABNORMAL LOW (ref 80.0–100.0)

## 2013-07-27 LAB — COMPREHENSIVE METABOLIC PANEL
AST: 106 U/L — ABNORMAL HIGH (ref 0–37)
Alkaline Phosphatase: 168 U/L — ABNORMAL HIGH (ref 39–117)
CO2: 23 mEq/L (ref 19–32)
Calcium: 7 mg/dL — ABNORMAL LOW (ref 8.4–10.5)
Chloride: 108 mEq/L (ref 96–112)
Creatinine, Ser: 0.73 mg/dL (ref 0.50–1.35)
GFR calc Af Amer: >90 mL/min (ref >90)
GFR calc non Af Amer: 88 mL/min — ABNORMAL LOW (ref >90)
Glucose, Bld: 124 mg/dL — ABNORMAL HIGH (ref 70–99)
Total Bilirubin: 2.6 mg/dL — ABNORMAL HIGH (ref 0.3–1.2)

## 2013-07-27 LAB — MAGNESIUM: Magnesium: 1.7 mg/dL (ref 1.5–2.5)

## 2013-07-27 LAB — CULTURE, BLOOD (ROUTINE X 2)
Culture: NO GROWTH
Culture: NO GROWTH

## 2013-07-27 LAB — PHOSPHORUS: Phosphorus: 2.7 mg/dL (ref 2.3–4.6)

## 2013-07-27 LAB — CBC
Hemoglobin: 9.5 g/dL — ABNORMAL LOW (ref 13.0–17.0)
MCH: 30 pg (ref 26.0–34.0)
MCHC: 33.3 g/dL (ref 30.0–36.0)
Platelets: 34 10*3/uL — ABNORMAL LOW (ref 150–400)
RBC: 3.17 MIL/uL — ABNORMAL LOW (ref 4.22–5.81)

## 2013-07-27 MED ORDER — SODIUM CHLORIDE 0.9 % IV SOLN
INTRAVENOUS | Status: DC
  Administered 2013-07-27 – 2013-07-31 (×3): via INTRAVENOUS
  Administered 2013-08-08: 20 mL/h via INTRAVENOUS

## 2013-07-27 MED ORDER — FAT EMULSION 20 % IV EMUL
120.0000 mL | INTRAVENOUS | Status: AC
  Administered 2013-07-27: 120 mL via INTRAVENOUS
  Filled 2013-07-27: qty 200

## 2013-07-27 MED ORDER — LEVALBUTEROL HCL 0.63 MG/3ML IN NEBU
0.6300 mg | INHALATION_SOLUTION | RESPIRATORY_TRACT | Status: DC
  Administered 2013-07-28 (×2): 0.63 mg via RESPIRATORY_TRACT
  Filled 2013-07-27 (×7): qty 3

## 2013-07-27 MED ORDER — POTASSIUM CHLORIDE 10 MEQ/50ML IV SOLN
10.0000 meq | INTRAVENOUS | Status: AC
  Administered 2013-07-27 (×3): 10 meq via INTRAVENOUS
  Filled 2013-07-27 (×3): qty 50

## 2013-07-27 MED ORDER — FENTANYL CITRATE 0.05 MG/ML IJ SOLN
25.0000 ug | INTRAMUSCULAR | Status: DC | PRN
  Administered 2013-07-28: 25 ug via INTRAVENOUS
  Filled 2013-07-27: qty 2

## 2013-07-27 MED ORDER — MAGNESIUM SULFATE IN D5W 10-5 MG/ML-% IV SOLN
1.0000 g | Freq: Once | INTRAVENOUS | Status: AC
  Administered 2013-07-27: 1 g via INTRAVENOUS
  Filled 2013-07-27: qty 100

## 2013-07-27 MED ORDER — TRACE MINERALS CR-CU-F-FE-I-MN-MO-SE-ZN IV SOLN
INTRAVENOUS | Status: AC
  Administered 2013-07-27: 18:00:00 via INTRAVENOUS
  Filled 2013-07-27: qty 2000

## 2013-07-27 NOTE — Progress Notes (Signed)
PULMONARY  / CRITICAL CARE MEDICINE  Name: [name] MRN: [id number] DOB: Apr 25, 1935    ADMISSION DATE:  07/18/2013 CONSULTATION DATE: 12-8  REFERRING MD :  [facility] PRIMARY SERVICE: PCCM  CHIEF COMPLAINT:  ARF  BRIEF PATIENT DESCRIPTION:  78 yo male with ischemic cardiomyopathy, severe PAD admitted with abdominal pain and CT abd demonstrates extensive ischemia changes, small bowel distension and was found to be in shock and respiratory failure and was urgently intubated and transferred to ICU.   SIGNIFICANT EVENTS / STUDIES:  12-8 intuabted, shock, surgical evaluation 12-8 DNR established  12/8 Exploratory laparotomy with entrectomy (majority of jejunum and proximal ileum) 12/9 Segmental small bowel resection terminal ileum  Two anastomoses to re-establish intestinal continuity 12/11 requires higher PEEP/FIO2 12/12 s/p R LE endarterectomy (Dr Early)    LINES / TUBES: 12- 8 OTT>> 12-8 Rt fem cvl>>12-8 12-8 Lt IJ cvl>>  CULTURES: 12-8 bc x 2>>ng 12-8 sputum>>e coli S to ceftx / zosyn  ANTIBIOTICS: 12-8 vanc>> 12-8 diflucan>> 12-8 pip tazo>>  HISTORY OF PRESENT ILLNESS:  78 yr old presented with Nausea and vomiting in adult with evidence of duodenitis on CT scan causing likely due to partial duodenal obstruction on CT scan ( recent Endoscopy 11-26: Three medium sized gastric antral ulcers. Single, large, gastric fundus ulcer , Angiodysplastic lesion in the gastric antrum & Duodenitis) . He had recently been admitted twice in the last 1 month once for atrial fibrillation and norvirus antritis and one week later for antritis and peptic ulcer disease.  Initial CT scan suggested mechanical duodenal obstruction versus duodenal inflammation. Was treated conservatively with bowel rest, PPI, GI was consulted. Underwent EGD on 07/20/2013 with changes were suggestive of severe erosive ulcerative gastritis, duodenitis and jujinitis - subsequently CT angiogram was ordered on 07/20/2013,  suggesting that patient had diffuse calcifications of his mesenteric circulation in that his CT scan look worse for small bowel inflammation/ was suspiciou necrosis now. Found with shock and respiratory failure and was urgently intubated and transferred to ICU.    SUBJECTIVE:  Awake, tolerating fentanyl gtt Performing well on PSV 5-10   VITAL SIGNS: Temp:  [97.8 F (36.6 C)-99.1 F (37.3 C)] 97.8 F (36.6 C) (12/14 0759) Pulse Rate:  [61-79] 69 (12/14 1000) Resp:  [0-28] 12 (12/14 1000) BP: (101-131)/(34-50) 111/40 mmHg (12/14 1000) SpO2:  [91 %-99 %] 96 % (12/14 1000) Arterial Line BP: (110-140)/(31-46) 124/37 mmHg (12/14 1000) FiO2 (%):  [40 %] 40 % (12/14 0814) Weight:  [89.5 kg (197 lb 5 oz)] 89.5 kg (197 lb 5 oz) (12/14 0200) HEMODYNAMICS: CVP:  [7 mmHg] 7 mmHg VENTILATOR SETTINGS: Vent Mode:  [-] CPAP;PSV FiO2 (%):  [40 %] 40 % Set Rate:  [15 bmp] 15 bmp Vt Set:  [620 mL] 620 mL PEEP:  [5 cmH20] 5 cmH20 Pressure Support:  [10 cmH20] 10 cmH20 Plateau Pressure:  [17 cmH20-21 cmH20] 21 cmH20 INTAKE / OUTPUT: Intake/Output     12/13 0701 - 12/14 0700 12/14 0701 - 12/15 0700   I.V. (mL/kg) 1427.6 (16) 195 (2.2)   NG/GT 60    IV Piggyback 1075 400   TPN 1810 210   Total Intake(mL/kg) 4372.6 (48.9) 805 (9)   Urine (mL/kg/hr) 1055 (0.5) 90 (0.3)   Emesis/NG output 30 (0)    Total Output 1085 90   Net +3287.6 +715          PHYSICAL EXAMINATION: General:  Elderly WM in no distress and intubated,  Neuro: Follows commands , RASS 0  HEENT: Edentulous, No JVD/LAN  Cardiovascular:  HSR RRR Lungs:  Decreased BS BL Abdomen:  Tense/Tender, OGT with bilious drainage Musculoskeletal:  Wasted musculature Skin:  Rt lower ext  Cool below knee, clear demarcation at proximal foot just below ankle   LABS:  CBC  Recent Labs Lab 07/25/13 0330  07/25/13 1100 07/26/13 0430 07/27/13 0400  WBC 11.3*  --   --  11.0* 9.9  HGB 9.9*  --   --  9.2* 9.5*  HCT 30.4*  --   --  27.7*  28.5*  PLT 16*  < > 38* 35*  32* 34*  < > = values in this interval not displayed. Coag's  Recent Labs Lab 07/25/13 0431 07/25/13 1100 07/26/13 0430  APTT 44* 42* 45*  INR 1.59* 1.58* 1.53*   BMET  Recent Labs Lab 07/25/13 0330 07/26/13 0430 07/27/13 0400  NA 142 142 138  K 3.4* 3.2* 3.5  CL 111 111 108  CO2 26 25 23   BUN 30* 22 20  CREATININE 0.97 0.79 0.73  GLUCOSE 128* 142* 124*   Electrolytes  Recent Labs Lab 07/25/13 0330 07/26/13 0430 07/27/13 0400  CALCIUM 7.1* 7.1* 7.0*  MG 1.7 1.9 1.7  PHOS 2.9 2.2* 2.7   Sepsis Markers  Recent Labs Lab 07/21/13 1010 07/24/13 0900 07/25/13 1000  LATICACIDVEN 10.5* 1.5 1.1   ABG  Recent Labs Lab 07/24/13 0129 07/24/13 0905 07/26/13 0313  PHART 7.461* 7.383 7.421  PCO2ART 34.8* 42.0 38.3  PO2ART 51.0* 169.0* 96.6   Liver Enzymes  Recent Labs Lab 07/25/13 0330 07/26/13 0430 07/27/13 0400  AST 277* 130* 106*  ALT 694* 393* 237*  ALKPHOS 178* 168* 168*  BILITOT 3.5* 2.8* 2.6*  ALBUMIN 1.6* 1.5* 1.3*   Cardiac Enzymes  Recent Labs Lab 07/24/13 1800 07/25/13 0330 07/26/13 0430  TROPONINI 1.54* 1.22* 0.74*   Glucose  Recent Labs Lab 07/26/13 1204 07/26/13 1601 07/26/13 2000 07/27/13 0013 07/27/13 0358 07/27/13 0757  GLUCAP 137* 123* 113* 140* 115* 132*    Imaging Dg Chest Port 1 View  07/27/2013   CLINICAL DATA:  Check endotracheal tube placement  EXAM: PORTABLE CHEST - 1 VIEW  COMPARISON:  07/26/2013  FINDINGS: Cardiac shadow is stable. A left central venous line is again noted in the mid superior vena cava. The endotracheal tube is noted 6.6 cm above the carinal. A nasogastric catheter is seen within the stomach. Stable changes are noted in the bases bilaterally. No new focal abnormality is seen.  IMPRESSION: No significant interval change from the prior exam.   Electronically Signed   By: Alcide Clever M.D.   On: 07/27/2013 08:09   Dg Chest Port 1 View  07/26/2013   CLINICAL  DATA:  Followup pneumonia  EXAM: PORTABLE CHEST - 1 VIEW  COMPARISON:  07/25/2013  FINDINGS: A left-sided jugular central venous line is again noted in the proximal superior vena cava. The endotracheal tube and nasogastric catheter are stable in appearance and within normal position. The cardiac shadow is stable. Postsurgical changes are again noted. Increasing effusion is noted in the right base. The lungs are otherwise stable.  IMPRESSION: Increasing right basilar effusion.   Electronically Signed   By: Alcide Clever M.D.   On: 07/26/2013 07:21    ASSESSMENT / PLAN:  PULMONARY A:Acute resp failure - worsening hypoxia/ALI 12/11 G neg- aspn pneumonia COPD with tobacco abuse hx Resp + metab alkalosis P:   BD's  Goal extubation 12/14   CARDIOVASCULAR A: Shock presumed sepsis,  SIRS response      CAD/ isch cardiomyopathy -EF 35%      Vasculopath -ischemic RLE > s/p endarterectomy 12/12      NSTEMI P:  femoral endarterectomy performed 12/12 Plavix held for low plts   RENAL A:  Acidosis, hyperkalemia, mild ARF, h/o ras Now Hypokalemic P:   Off bicarb gtt Avoid acei/ contrast  GASTROINTESTINAL A:  SBO/Ischemia s/p extensive SB resection Shock liver -LFTs improving H/o antral ulcers P:   CCS following ct TNA    HEMATOLOGIC A:  Thrombocytopenia Coagulopathy-  With low fibrinogen, low grade DIC picture P:  scds  Follow CBC   INFECTIOUS A:  R/o bactereial translocation, at risk perf P:   ct zosyn Continue vanc/ diflucan   ENDOCRINE A:  Hypoglycemia, cortisol > 150 P:   SSI Dc stress steroids  NEUROLOGIC A:  Sedation, MS improved P:   fent for pain  TODAY'S SUMMARY: Guarded prognosis given extensive bowel ischemia & RLE ischemia s/p endarterectomy. Limited code. Planning for extubation 12/14. May yet require R foot surgery    Care during the described time interval was provided by me and/or other providers on the critical care team.  I have reviewed this  patient's available data, including medical history, events of note, physical examination and test results as part of my evaluation  CC time x  35 m  Levy Pupa, MD, PhD 07/27/2013, 10:54 AM [facility] [phone] or if no answer [phone]

## 2013-07-27 NOTE — Progress Notes (Signed)
Pt desat to 83 on [place] 6lts increased work of breathing, RR 31, Breath sounds increasing rhonchi and now crackles bilaterally. Placed on Venti 50%, sats up to 89, then placed on 100%NRB and sats up to 93. Xopenex prn treatment being given. Call placed to elink, waiting for call back.

## 2013-07-27 NOTE — Progress Notes (Addendum)
   Daily Progress Note  Assessment/Planning: POD #2 s/p R femoral endarterectomy, s/p subtotal bowel resection   No obvious perfusion to foot  CK trending up along with +myoglobin: consistent with clinical evidence of muscle death in R foot, CK still < 1K and Cr still ok. The question of R AKA vs BKA will become a pressing one over the next day or two. Long-term survival more dependent on length of residual bowel than R foot  If long-term survival not anticipate, limited value to amputation.  Subjective  - 2 Days Post-Op  C/o R leg pain  Objective Filed Vitals:   07/27/13 0415 07/27/13 0500 07/27/13 0600 07/27/13 0700  BP: 131/34 130/50 118/40 120/42  Pulse: 61 73 62 66  Temp:      TempSrc:      Resp: 17 22 15  0  Height:      Weight:      SpO2: 97% 97% 96% 97%    Intake/Output Summary (Last 24 hours) at 07/27/13 0755 Last data filed at 07/27/13 0700  Gross per 24 hour  Intake 4372.61 ml  Output   1025 ml  Net 3347.61 ml   VASC  R groin c/d/i, R foot demarcating, mottled and cold, no pedal signals  Laboratory CBC    Component Value Date/Time   WBC 9.9 07/27/2013 0400   HGB 9.5* 07/27/2013 0400   HCT 28.5* 07/27/2013 0400   PLT 34* 07/27/2013 0400    BMET    Component Value Date/Time   NA 138 07/27/2013 0400   K 3.5 07/27/2013 0400   CL 108 07/27/2013 0400   CO2 23 07/27/2013 0400   GLUCOSE 124* 07/27/2013 0400   BUN 20 07/27/2013 0400   CREATININE 0.73 07/27/2013 0400   CALCIUM 7.0* 07/27/2013 0400   GFRNONAA 88* 07/27/2013 0400   GFRAA >90 07/27/2013 0400   CK  636 -> 973 Myoglobin 2461  Leonides Sake, MD [facility] Office: [phone] Pager: [phone]  07/27/2013, 7:55 AM

## 2013-07-27 NOTE — Procedures (Signed)
Extubation Procedure Note  Patient Details:   Name: WOODS GANGEMI DOB: 1934-12-21 MRN: [id number]   Airway Documentation:     Evaluation  O2 sats: stable throughout Complications: No apparent complications Patient did tolerate procedure well. Bilateral Breath Sounds: Diminished (slight coarse) Suctioning: Oral;Airway Yes pt able to vocalize.   Pt extubated at this time per MD order and tolerated well. Pt able to breathe around deflated cuff. VS stable. No stridor noted. RT will continue to monitor.    Loyal Jacobson [facility] 07/27/2013, 11:15 AM

## 2013-07-27 NOTE — Progress Notes (Signed)
eLink Physician-Brief Progress Note Patient Name: [name] DOB: 31-Aug-1934 MRN: [id number]  Date of Service  07/27/2013   HPI/Events of Note  Patient extubated today.  Since that time paitent's oxygen requirements have increased and is now on 100% NRB.  Patient check by camera shows no excessive use of accessory muscles but RR of 24 and BP of 128/45 (75), HR of 95.  ABG shows hypoxia and PCXR shows worsening of airspace in bilateral bases.   eICU Interventions  Plan: Minimize fluids Lasix 80 mg IV times one dose now BiPAP prn   Intervention Category Intermediate Interventions: Respiratory distress - evaluation and management  Aleyda Gindlesperger 07/27/2013, 11:35 PM

## 2013-07-27 NOTE — Progress Notes (Signed)
2 Days Post-Op  Subjective: POD #5 after SB resection/ anastomosis/ abdominal wall closure No bowel function yet Remains intubated - weaning off Levophed Awake, alert Indicates right lower extremity pain No abdominal pain  Objective: Vital signs in last 24 hours: Temp:  [97.8 F (36.6 C)-99.1 F (37.3 C)] 97.8 F (36.6 C) (12/14 0759) Pulse Rate:  [61-79] 66 (12/14 0700) Resp:  [0-28] 0 (12/14 0700) BP: (108-132)/(34-50) 120/42 mmHg (12/14 0700) SpO2:  [94 %-99 %] 97 % (12/14 0700) Arterial Line BP: (111-140)/(31-46) 116/40 mmHg (12/14 0700) FiO2 (%):  [40 %] 40 % (12/14 0700) Weight:  [197 lb 5 oz (89.5 kg)] 197 lb 5 oz (89.5 kg) (12/14 0200) Last BM Date: 07/18/13  Intake/Output from previous day: 12/13 0701 - 12/14 0700 In: 4372.6 [I.V.:1427.6; NG/GT:60; IV Piggyback:1075; TPN:1810] Out: 1025 [Urine:995; Emesis/NG output:30] Intake/Output this shift:    General appearance: alert, cooperative and intubated Resp: coarse bilateral rhonchi Cardio: regular rate and rhythm, S1, S2 normal, no murmur, click, rub or gallop GI: soft, non-tender; minimal bowel sounds Incision c/d/i  Lab Results:   Recent Labs  07/26/13 0430 07/27/13 0400  WBC 11.0* 9.9  HGB 9.2* 9.5*  HCT 27.7* 28.5*  PLT 35*  32* 34*   BMET  Recent Labs  07/26/13 0430 07/27/13 0400  NA 142 138  K 3.2* 3.5  CL 111 108  CO2 25 23  GLUCOSE 142* 124*  BUN 22 20  CREATININE 0.79 0.73  CALCIUM 7.1* 7.0*   PT/INR  Recent Labs  07/25/13 1100 07/26/13 0430  LABPROT 18.4* 18.0*  INR 1.58* 1.53*   ABG  Recent Labs  07/24/13 0905 07/26/13 0313  PHART 7.383 7.421  HCO3 25.1* 24.4*    Studies/Results: Dg Chest Port 1 View  07/26/2013   CLINICAL DATA:  Followup pneumonia  EXAM: PORTABLE CHEST - 1 VIEW  COMPARISON:  07/25/2013  FINDINGS: A left-sided jugular central venous line is again noted in the proximal superior vena cava. The endotracheal tube and nasogastric catheter are stable in  appearance and within normal position. The cardiac shadow is stable. Postsurgical changes are again noted. Increasing effusion is noted in the right base. The lungs are otherwise stable.  IMPRESSION: Increasing right basilar effusion.   Electronically Signed   By: Alcide Clever M.D.   On: 07/26/2013 07:21    Anti-infectives: Anti-infectives   Start     Dose/Rate Route Frequency Ordered Stop   07/25/13 1115  vancomycin (VANCOCIN) IVPB 1000 mg/200 mL premix     1,000 mg 200 mL/hr over 60 Minutes Intravenous To Surgery 07/25/13 1108 07/25/13 1111   07/22/13 2200  vancomycin (VANCOCIN) IVPB 1000 mg/200 mL premix     1,000 mg 200 mL/hr over 60 Minutes Intravenous Every 12 hours 07/22/13 2056     07/21/13 1000  fluconazole (DIFLUCAN) IVPB 200 mg     200 mg 100 mL/hr over 60 Minutes Intravenous Every 24 hours 07/21/13 0944     07/21/13 0945  metroNIDAZOLE (FLAGYL) IVPB 500 mg  Status:  Discontinued     500 mg 100 mL/hr over 60 Minutes Intravenous Every 8 hours 07/21/13 0933 07/25/13 0918   07/21/13 0945  piperacillin-tazobactam (ZOSYN) IVPB 3.375 g     3.375 g 12.5 mL/hr over 240 Minutes Intravenous 3 times per day 07/21/13 0944     07/21/13 0800  vancomycin (VANCOCIN) IVPB 1000 mg/200 mL premix  Status:  Discontinued     1,000 mg 200 mL/hr over 60 Minutes Intravenous Every 12 hours  07/21/13 0626 07/22/13 1045   07/21/13 0615  ceFEPIme (MAXIPIME) 1 g in dextrose 5 % 50 mL IVPB  Status:  Discontinued     1 g 100 mL/hr over 30 Minutes Intravenous 3 times per day 07/21/13 0613 07/21/13 0919      Assessment/Plan: s/p Procedure(s): THROMBECTOMY OF  FEMORAL ARTERY (Right) S/p small bowel resection of the majority of his small bowel secondary to ischemia - only about 120 cm remaining Patient will likely have difficulty with short bowel syndrome - may require long-term TNA Too early to remove staples No significant changes  LOS: 9 days    [name] Yearick K. 07/27/2013

## 2013-07-27 NOTE — Progress Notes (Signed)
PARENTERAL NUTRITION CONSULT NOTE  Pharmacy Consult for TPN Indication: massive bowel resection  No Known Allergies  Patient Measurements: Height: 6' (182.9 cm) Weight: 197 lb 5 oz (89.5 kg) IBW/kg (Calculated) : 77.6  Vital Signs: Temp: 97.8 F (36.6 C) (12/14 0759) Temp src: Oral (12/14 0759) BP: 124/39 mmHg (12/14 0814) Pulse Rate: 72 (12/14 0814) Intake/Output from previous day: 12/13 0701 - 12/14 0700 In: 4372.6 [I.V.:1427.6; NG/GT:60; IV Piggyback:1075; TPN:1810] Out: 1085 [Urine:1055; Emesis/NG output:30] Intake/Output from this shift: Total I/O In: 135 [I.V.:65; TPN:70] Out: 60 [Urine:60]  Labs:  Recent Labs  07/25/13 0330 07/25/13 0431  07/25/13 1100 07/26/13 0430 07/27/13 0400  WBC 11.3*  --   --   --  11.0* 9.9  HGB 9.9*  --   --   --  9.2* 9.5*  HCT 30.4*  --   --   --  27.7* 28.5*  PLT 16* 19*  < > 38* 35*  32* 34*  APTT  --  44*  --  42* 45*  --   INR  --  1.59*  --  1.58* 1.53*  --   < > = values in this interval not displayed.   Recent Labs  07/25/13 0330 07/26/13 0430 07/27/13 0400  NA 142 142 138  K 3.4* 3.2* 3.5  CL 111 111 108  CO2 26 25 23   GLUCOSE 128* 142* 124*  BUN 30* 22 20  CREATININE 0.97 0.79 0.73  CALCIUM 7.1* 7.1* 7.0*  MG 1.7 1.9 1.7  PHOS 2.9 2.2* 2.7  PROT 3.7* 3.6* 3.6*  ALBUMIN 1.6* 1.5* 1.3*  AST 277* 130* 106*  ALT 694* 393* 237*  ALKPHOS 178* 168* 168*  BILITOT 3.5* 2.8* 2.6*   Estimated Creatinine Clearance: 86.2 ml/min (by C-G formula based on Cr of 0.73).    Recent Labs  07/27/13 0013 07/27/13 0358 07/27/13 0757  GLUCAP 140* 115* 132*    Insulin Requirements in the past 24 hours:  5 units SSI, CBGs all < 150  Current Nutrition:  Clinimix E 5/15 at 69mL/hr and lipid 20% emulsion at 47mL/hr- This is providing 72 gm protein and 1502 kcals.  Nutritional Goals:  1650-1800 kCal, 110-120 grams of protein per day per RD recommendations 12/12  Admit: 8 YOM admitted with N/V, dehydration, abdominal  pain and enteritis d/t norovirus. Found to have mesenteric ischemia and infarcted small bowel. Underwent small bowel resection-majority of jejunum and proximal ileum- currently has minimal amount of small bowel needed to survive. Patient is DNR but continuing full medical care. May require long term TPN.  GI: 12/7 EGD showed changes suggestive of severe erosive ulcerative gastritis, duodenitis and jujinitis - 12/7 CT angiogram suggested diffuse calcifications of mesenteric circulation; 12/8 massive bowel resection - majority of jejunum and proximal ileum. 12/9 resection of terminal ileuim. Two anastomoses to re-establish intestinal continuity. has OG tube-84mL of output charted in last 24 hours. Last BM 12/5, minimal bowel sounds  Endo: CBGs all < 150; TSH nml on 07/21/2013  Lytes: Na 138, K 3.5, Phos 2.7, Corrected Ca 9.2, Mag 1.7  Renal: SCr 0.73, UOP ~0.5 mL/kg/hr  Hepatobil: shocked liver- AST down to 106, ALT down to 237, alk phos 168, Tbili 2.6, albumin 1.3, prealbumin <3, triglycerides 210  Pulm: acute resp failure on vent; 40% FiO2, PEEP 5  Cards: chronic vascular disease- more flow in left foot than right which is baseline- s/p right femoral endarterectomy. Amputation may still be required at some point. Shock from presumed sepsis, NSTEMI- now  off pressors. BP 124/39, HR 60-70s. Hx AFib on po amiodarone  Neuro: GCS 15, RASS (-1) - (+2) on fent drip   Heme: aPTT 45 seconds, PT 18, INR 1.53- improving. Patient has received vitamin K and FFP as reversal before surgeries. Hgb 9.5, plts 34. Does have ulcers and anticoagulation has been held in the past d/t GI bleed. Has low fibrinogen and high D-Dimer.   ID: high risk of perforation, r/o bacterial translocation. On vanc, Zosyn, fluconazole. WBC 9.9, currently afebrile. LA 1.1 (12/12)  Best Practices: mouth care, SCDs  TPN Access: left IJ- triple lumen (placed 12/8)  TPN day#: 5  Plan:  1. Increase Clinimix E 5/15 to goal rate of 83  mL/hr and decrease lipid 20% emulsion to 52mL/hr. This will provide 100g protein and ~1810kcals. 2. Will utilize full multivitamin and trace elements. If patient becomes jaundiced, will need to reduce to half trace elements. 3. Reduce NS to 64mL/hr at 1800 tonight when new TPN bag is hung- MD can decide if an increase or decrease in IVF is warranted  5. TPN labs as ordered 6. Continue sensitive SSI and CBGs as ordered 7. Continue to follow for clinical progression, ability to tolerate TPN 8. Replete magnesium with 1g IV x1 9. Replete potassium with IV x3  Jorden Mahl D. Clarann Helvey, PharmD, BCPS Clinical Pharmacist Pager: [phone] 07/27/2013 9:26 AM

## 2013-07-28 ENCOUNTER — Inpatient Hospital Stay (HOSPITAL_COMMUNITY): Payer: Medicare Other

## 2013-07-28 LAB — CBC
HCT: 29.7 % — ABNORMAL LOW (ref 39.0–52.0)
MCHC: 33 g/dL (ref 30.0–36.0)
MCV: 90.5 fL (ref 78.0–100.0)
Platelets: 41 10*3/uL — ABNORMAL LOW (ref 150–400)
RDW: 18.9 % — ABNORMAL HIGH (ref 11.5–15.5)

## 2013-07-28 LAB — BASIC METABOLIC PANEL
BUN: 19 mg/dL (ref 6–23)
BUN: 19 mg/dL (ref 6–23)
CO2: 25 mEq/L (ref 19–32)
CO2: 27 mEq/L (ref 19–32)
Calcium: 7.4 mg/dL — ABNORMAL LOW (ref 8.4–10.5)
Calcium: 7.3 mg/dL — ABNORMAL LOW (ref 8.4–10.5)
Creatinine, Ser: 0.73 mg/dL (ref 0.50–1.35)
Creatinine, Ser: 0.75 mg/dL (ref 0.50–1.35)
GFR calc Af Amer: >90 mL/min (ref >90)
GFR calc non Af Amer: 87 mL/min — ABNORMAL LOW (ref >90)
Glucose, Bld: 148 mg/dL — ABNORMAL HIGH (ref 70–99)
Glucose, Bld: 161 mg/dL — ABNORMAL HIGH (ref 70–99)
Potassium: 3.6 mEq/L (ref 3.5–5.1)
Potassium: 3.2 mEq/L — ABNORMAL LOW (ref 3.5–5.1)
Sodium: 135 mEq/L (ref 135–145)

## 2013-07-28 LAB — GLUCOSE, CAPILLARY
Glucose-Capillary: 140 mg/dL — ABNORMAL HIGH (ref 70–99)
Glucose-Capillary: 141 mg/dL — ABNORMAL HIGH (ref 70–99)
Glucose-Capillary: 170 mg/dL — ABNORMAL HIGH (ref 70–99)

## 2013-07-28 LAB — PHOSPHORUS: Phosphorus: 2.7 mg/dL (ref 2.3–4.6)

## 2013-07-28 LAB — DIFFERENTIAL
Basophils Absolute: 0 10*3/uL (ref 0.0–0.1)
Eosinophils Absolute: 0.1 10*3/uL (ref 0.0–0.7)
Eosinophils Relative: 1 % (ref 0–5)
Lymphocytes Relative: 9 % — ABNORMAL LOW (ref 12–46)
Lymphs Abs: 1.1 10*3/uL (ref 0.7–4.0)
Monocytes Absolute: 0.8 10*3/uL (ref 0.1–1.0)
Neutro Abs: 10.7 10*3/uL — ABNORMAL HIGH (ref 1.7–7.7)
Neutrophils Relative %: 84 % — ABNORMAL HIGH (ref 43–77)

## 2013-07-28 MED ORDER — FENTANYL 10 MCG/ML IV SOLN
INTRAVENOUS | Status: DC
  Administered 2013-07-29: 90 ug via INTRAVENOUS
  Administered 2013-07-29: 01:00:00 via INTRAVENOUS
  Administered 2013-07-29: 15 ug via INTRAVENOUS
  Filled 2013-07-28: qty 50

## 2013-07-28 MED ORDER — DIPHENHYDRAMINE HCL 50 MG/ML IJ SOLN: 12.5000 mg | Freq: Four times a day (QID) | INTRAMUSCULAR | Status: DC | PRN

## 2013-07-28 MED ORDER — LEVALBUTEROL HCL 0.63 MG/3ML IN NEBU
0.6300 mg | INHALATION_SOLUTION | Freq: Four times a day (QID) | RESPIRATORY_TRACT | Status: DC
  Administered 2013-07-28 – 2013-08-01 (×16): 0.63 mg via RESPIRATORY_TRACT
  Filled 2013-07-28 (×22): qty 3

## 2013-07-28 MED ORDER — POTASSIUM CHLORIDE 10 MEQ/50ML IV SOLN
10.0000 meq | Freq: Three times a day (TID) | INTRAVENOUS | Status: AC
  Administered 2013-07-28 (×2): 10 meq via INTRAVENOUS
  Filled 2013-07-28 (×3): qty 50

## 2013-07-28 MED ORDER — LEVALBUTEROL HCL 0.63 MG/3ML IN NEBU
0.6300 mg | INHALATION_SOLUTION | RESPIRATORY_TRACT | Status: DC | PRN
  Administered 2013-08-09: 0.63 mg via RESPIRATORY_TRACT
  Filled 2013-07-28: qty 3

## 2013-07-28 MED ORDER — SODIUM CHLORIDE 0.9 % IJ SOLN: 9.0000 mL | INTRAMUSCULAR | Status: DC | PRN

## 2013-07-28 MED ORDER — FUROSEMIDE 10 MG/ML IJ SOLN
40.0000 mg | Freq: Three times a day (TID) | INTRAMUSCULAR | Status: AC
  Administered 2013-07-28 (×2): 40 mg via INTRAVENOUS
  Filled 2013-07-28 (×2): qty 4

## 2013-07-28 MED ORDER — TRACE MINERALS CR-CU-F-FE-I-MN-MO-SE-ZN IV SOLN
INTRAVENOUS | Status: AC
  Administered 2013-07-28: 17:00:00 via INTRAVENOUS
  Filled 2013-07-28: qty 2000

## 2013-07-28 MED ORDER — FAT EMULSION 20 % IV EMUL
250.0000 mL | INTRAVENOUS | Status: AC
  Administered 2013-07-28: 250 mL via INTRAVENOUS
  Filled 2013-07-28: qty 250

## 2013-07-28 MED ORDER — NALOXONE HCL 0.4 MG/ML IJ SOLN: 0.4000 mg | INTRAMUSCULAR | Status: DC | PRN

## 2013-07-28 MED ORDER — DIPHENHYDRAMINE HCL 12.5 MG/5ML PO ELIX
12.5000 mg | ORAL_SOLUTION | Freq: Four times a day (QID) | ORAL | Status: DC | PRN
  Filled 2013-07-28: qty 5

## 2013-07-28 MED ORDER — FENTANYL CITRATE 0.05 MG/ML IJ SOLN
25.0000 ug | INTRAMUSCULAR | Status: DC | PRN
  Administered 2013-07-28 (×3): 50 ug via INTRAVENOUS
  Administered 2013-07-28 – 2013-07-29 (×2): 25 ug via INTRAVENOUS
  Administered 2013-07-29 – 2013-08-04 (×24): 50 ug via INTRAVENOUS
  Administered 2013-08-05 – 2013-08-06 (×3): 25 ug via INTRAVENOUS
  Administered 2013-08-07 – 2013-08-09 (×4): 50 ug via INTRAVENOUS
  Filled 2013-07-28 (×34): qty 2

## 2013-07-28 MED ORDER — MAGNESIUM SULFATE 40 MG/ML IJ SOLN
2.0000 g | Freq: Once | INTRAMUSCULAR | Status: AC
  Administered 2013-07-28: 2 g via INTRAVENOUS
  Filled 2013-07-28: qty 50

## 2013-07-28 MED ORDER — POTASSIUM CHLORIDE 10 MEQ/100ML IV SOLN: 10.0000 meq | Freq: Three times a day (TID) | INTRAVENOUS | Status: DC

## 2013-07-28 MED ORDER — POTASSIUM CHLORIDE 10 MEQ/50ML IV SOLN
10.0000 meq | INTRAVENOUS | Status: AC
  Administered 2013-07-28 (×2): 10 meq via INTRAVENOUS
  Filled 2013-07-28: qty 50

## 2013-07-28 MED ORDER — ONDANSETRON HCL 4 MG/2ML IJ SOLN: 4.0000 mg | Freq: Four times a day (QID) | INTRAMUSCULAR | Status: DC | PRN

## 2013-07-28 MED ORDER — FUROSEMIDE 10 MG/ML IJ SOLN
80.0000 mg | Freq: Once | INTRAMUSCULAR | Status: AC
  Administered 2013-07-28: 80 mg via INTRAVENOUS
  Filled 2013-07-28: qty 8

## 2013-07-28 NOTE — Significant Event (Addendum)
1335pm-responded to patient's spouse that he had pulled oxygen tube off. Noted patient had pulled NG tube completely out; patient stating "I didn't mean to". Spoke with Dr. Janee Morn to see if need to be replaced. Per MD, to replace NG. RN placed NG via left nare without any complications. Verified placement via ascultation with Donalee Citrin. KUB obtained per unit protocol. Will continue to monitor. Bibiana Gillean, Charity fundraiser.

## 2013-07-28 NOTE — Progress Notes (Signed)
PARENTERAL NUTRITION CONSULT NOTE  Pharmacy Consult for TPN Indication: massive bowel resection  No Known Allergies  Patient Measurements: Height: 6' (182.9 cm) Weight: 194 lb 7.1 oz (88.2 kg) IBW/kg (Calculated) : 77.6  Vital Signs: Temp: 97.6 F (36.4 C) (12/15 0400) Temp src: Oral (12/15 0400) BP: 118/42 mmHg (12/15 0700) Pulse Rate: 80 (12/15 0700) Intake/Output from previous day: 12/14 0701 - 12/15 0700 In: 3737.1 [I.V.:855; NG/GT:60; IV Piggyback:900; TPN:1922.1] Out: 4365 [Urine:4165; Emesis/NG output:200] Intake/Output from this shift:    Labs:  Recent Labs  07/25/13 1100 07/26/13 0430 07/27/13 0400 07/28/13 0405  WBC  --  11.0* 9.9 12.7*  HGB  --  9.2* 9.5* 9.8*  HCT  --  27.7* 28.5* 29.7*  PLT 38* 35*  32* 34* 41*  APTT 42* 45*  --   --   INR 1.58* 1.53*  --   --      Recent Labs  07/26/13 0430 07/27/13 0400 07/28/13 0405  NA 142 138 135  K 3.2* 3.5 3.6  CL 111 108 104  CO2 25 23 25   GLUCOSE 142* 124* 148*  BUN 22 20 19   CREATININE 0.79 0.73 0.73  CALCIUM 7.1* 7.0* 7.4*  MG 1.9 1.7 1.7  PHOS 2.2* 2.7 2.7  PROT 3.6* 3.6*  --   ALBUMIN 1.5* 1.3*  --   AST 130* 106*  --   ALT 393* 237*  --   ALKPHOS 168* 168*  --   BILITOT 2.8* 2.6*  --   TRIG  --   --  140   Estimated Creatinine Clearance: 86.2 ml/min (by C-G formula based on Cr of 0.73).    Recent Labs  07/27/13 1955 07/28/13 0010 07/28/13 0349  GLUCAP 135* 135* 140*    Insulin Requirements in the past 24 hours:  3 units SSI  Current Nutrition:  NPO  Clinimix E 5/15 at 83 mL/hr and lipid 20% emulsion at 5 mL/hr provides 1654 kcal and 99.6 grams protein per day * Goal rate of Clinimix E 5/15 at 83 ml/hr and lipid 20% emulsion at 10 ml/hr on 5x/wk (Mon-Fri) provides an average of 1756 kcal and 99.6 grams protein per day (100% kcal and 90% protein goal)  Nutritional Goals:  1650-1800 kCal, 110-120 grams of protein per day per RD recommendations 12/12  Admit: 42 YOM admitted  with N/V, dehydration, abdominal pain and enteritis d/t norovirus. Found to have mesenteric ischemia and infarcted small bowel. Underwent small bowel resection-majority of jejunum and proximal ileum- currently has minimal amount of small bowel needed to survive. Patient is DNR but continuing full medical care. May require long term TPN.  GI: 12/7 EGD showed changes suggestive of severe erosive ulcerative gastritis, duodenitis and jujinitis - 12/7 CT angiogram suggested diffuse calcifications of mesenteric circulation; 12/8 massive bowel resection - majority of jejunum and proximal ileum. 12/9 resection of terminal ileuim. Two anastomoses to re-establish intestinal continuity. has OG tube-66mL of output charted in last 24 hours. Last BM 12/5, minimal bowel sounds. TPN started 12/10 for nutritional support. NGO 30 cc/24h.  Endo: No hx. CBGs/12h: 125-140. On sensitive SSI Lytes: K 3.6 (KCl 10 mEq IV x 2 replaced per MD), Mg 1.7 (will replace), Na 135, Phos 2.7, CoCa~9.6 Renal: SCr 0.73, CrCl~80-90 ml/min. UOP 1.8 ml/kg/hr. I/O 3848/1150 = +2.7 L Pulm: Hx COPD/tobacco abuse. Extubated 12/14. 93% on 6L n.c.  Cards: Hx CAD(CABG '95), PCI '10)/DL/HTN/PVD/Aflutter. VVS on board for likely R-AKA/BKA this week d/t severe PVD. S/p shock -- pressors now weaned  off. BP soft-wnl, HR low-wnl (NSR/BBB). On amiodarone, lasix/kcl.  Hepatobil: Shocked liver - resolving. AST/ALT 107/237 << 130/393, Alk Phos 168 (stable), TBili 2.6 << 2.8. TG 140 (12/15) << 210 (12/11), pre-albumin <3 (12/11), alb 1.3 (12/14) Neuro: Hx CVA/depression. GCS 15 - no sedation.  Heme: aPTT 45 seconds, PT 18, INR 1.53- improving. Patient has received vitamin K and FFP as reversal before surgeries. Hgb 9.5, plts 34. Does have ulcers and anticoagulation has been held in the past d/t GI bleed. Has low fibrinogen and high D-Dimer.  ID:  Vanc + Zosyn + Fluconazole for high risk of perforation, r/o bacterial translocation. Afebrile, WBC 12.7 << 9.9.   Best Practices: MC, SCDs, PPI IV TPN Access: LIJ- triple lumen (placed 12/8) TPN day#: 5  (12/10 >> current)  Plan - Continue Clinimix E 5/15 at a rate of 83 ml/hr - Add MV and TE in TPN bag - Will plan to supplement 20% lipids at 10 ml/hr 5x/wk (Mon-Fri) to help meet nutritional goals  - Will reduce IVF to [facility] - Magnesium 2g IV x 1 dose today - Continue sensitive SSI - Will f/u TPN labs  Georgina Pillion, PharmD, BCPS Clinical Pharmacist Pager: [phone] 07/28/2013 11:53 AM

## 2013-07-28 NOTE — Progress Notes (Addendum)
[facility]  Subjective  - Sever night time pain in the right foot.  S/P R femoral endarterectomy, s/p subtotal bowel resection. The question of R AKA vs BKA will become a pressing one over the next day or two.     Objective 134/45 83 97.3 F (36.3 C) (Oral) 25 97%  Intake/Output Summary (Last 24 hours) at 07/28/13 0904 Last data filed at 07/28/13 0800  Gross per 24 hour  Intake 3555.1 ml  Output   4475 ml  Net -919.9 ml   Right groin incision is C/D without hematoma. Right foot cold,demarcating, and mottled No pulses, no doppler signal.      Assessment/Planning:  POD #3 s/p R femoral endarterectomy, s/p subtotal bowel resection  No obvious perfusion to foot  CK trending up along with +myoglobin: consistent with clinical evidence of muscle death in R foot, CK still < 1K and Cr still ok.  The question of R AKA vs BKA will become a pressing one over the next day or two.  Long-term survival more dependent on length of residual bowel than R foot  If long-term survival not anticipate, limited value to amputation.   Clinton Gallant [facility] 07/28/2013 9:04 AM --  Laboratory Lab Results:  Recent Labs  07/27/13 0400 07/28/13 0405  WBC 9.9 12.7*  HGB 9.5* 9.8*  HCT 28.5* 29.7*  PLT 34* 41*   BMET  Recent Labs  07/27/13 0400 07/28/13 0405  NA 138 135  K 3.5 3.6  CL 108 104  CO2 23 25  GLUCOSE 124* 148*  BUN 20 19  CREATININE 0.73 0.73  CALCIUM 7.0* 7.4*    COAG Lab Results  Component Value Date   INR 1.53* 07/26/2013   INR 1.58* 07/25/2013   INR 1.59* 07/25/2013   No results found for this basename: PTT   Addendum  I have independently interviewed and examined the patient, and I agree with the physician assistant's findings.  R foot is dying with bullae developing.   I discussed briefly with patient proceeding with R AKA.  The patient is going to consider such.  Dr. Arbie Cookey will be back tomorrow.  Leonides Sake,  MD [facility] [place] Office: [phone] Pager: [phone]  07/28/2013, 12:42 PM

## 2013-07-28 NOTE — Significant Event (Signed)
Patient has order for A Line removal placed this morning by CCM. Extremities are edematous and patient is a difficult arterial stick and is returning to OR possibly Wednesday per Dr. Imogene Burn. Called to black box and spoke with Dr. Molli Knock, received order to leave A-line today and reassess tomorrow. Derrick Orris, Charity fundraiser.

## 2013-07-28 NOTE — Care Management Note (Unsigned)
    Page 1 of 2   08/08/2013     12:21:02 PM   CARE MANAGEMENT NOTE 08/08/2013  Patient:  [name], [name]   Account Number:  [id number]  Date Initiated:  07/22/2013  Documentation initiated by:  [facility]  Subjective/Objective Assessment:   Readmitted with n/v - ileus.  Now with sepsis - ischemic bowel - to surgery. intubated.     Action/Plan:   Anticipated DC Date:  07/29/2013   Anticipated DC Plan:  SKILLED NURSING FACILITY      DC Planning Services  CM consult      Choice offered to / List presented to:             Status of service:  In process, will continue to follow Medicare Important Message given?   (If response is "NO", the following Medicare IM given date fields will be blank) Date Medicare IM given:   Date Additional Medicare IM given:    Discharge Disposition:    Per UR Regulation:  Reviewed for med. necessity/level of care/duration of stay  If discussed at Long Length of Stay Meetings, dates discussed:    Comments:  08/08/2013 Crystal Hutchinson RN, BSN, MSHL, CCM Request for LTAC evaluation sent to [facility]. Hx/o R AKA 07/29/2013 and TPN   ContactDin, Bookwalter Spouse [phone]                 Ahmed Prima   [phone]  07-30-13  9:30am Avie Arenas, RNBSN [phone] Post op R AKA 12-16 - remains on vent.  Weaning today.   07-28-13 8am Johny Shears - [phone] Femoral endarterectomy on 12-12 with no increase in perfusion noted.  Ext 12-14,  this am increased oxygen demands requiring 100% NRB.  07-23-13 9am Johny Shears - [phone] Remains on vent - awake - Short Gut syndrome - cannot survive with anymore taken out so hopefully no further complications as more bowel removal is not an option.

## 2013-07-28 NOTE — Progress Notes (Signed)
ANTIBIOTIC CONSULT NOTE - FOLLOW UP  Pharmacy Consult for Vanco/Zosyn/Fluconazole Indication: pneumonia, Bowel necrosis  No Known Allergies  Patient Measurements: Height: 6' (182.9 cm) Weight: 194 lb 7.1 oz (88.2 kg) IBW/kg (Calculated) : 77.6 Adjusted Body Weight:   Vital Signs: Temp: 97.2 F (36.2 C) (12/15 1150) Temp src: Oral (12/15 1150) BP: 118/41 mmHg (12/15 1100) Pulse Rate: 80 (12/15 1130) Intake/Output from previous day: 12/14 0701 - 12/15 0700 In: 3737.1 [I.V.:855; NG/GT:60; IV Piggyback:900; TPN:1922.1] Out: 4365 [Urine:4165; Emesis/NG output:200] Intake/Output from this shift: Total I/O In: 712 [NG/GT:60; IV Piggyback:300; TPN:352] Out: 800 [Urine:800]  Labs:  Recent Labs  07/26/13 0430 07/27/13 0400 07/28/13 0405  WBC 11.0* 9.9 12.7*  HGB 9.2* 9.5* 9.8*  PLT 35*  32* 34* 41*  CREATININE 0.79 0.73 0.73   Estimated Creatinine Clearance: 86.2 ml/min (by C-G formula based on Cr of 0.73). No results found for this basename: VANCOTROUGH, Leodis Binet, VANCORANDOM, GENTTROUGH, GENTPEAK, GENTRANDOM, TOBRATROUGH, TOBRAPEAK, TOBRARND, AMIKACINPEAK, AMIKACINTROU, AMIKACIN,  in the last 72 hours   Microbiology: Recent Results (from the past 720 hour(s))  MRSA PCR SCREENING     Status: None   Collection Time    07/06/13 10:25 PM      Result Value Range Status   MRSA by PCR NEGATIVE  NEGATIVE Final   Comment:            The GeneXpert MRSA Assay (FDA     approved for NASAL specimens     only), is one component of a     comprehensive MRSA colonization     surveillance program. It is not     intended to diagnose MRSA     infection nor to guide or     monitor treatment for     MRSA infections.  CLOSTRIDIUM DIFFICILE BY PCR     Status: None   Collection Time    07/08/13  4:55 PM      Result Value Range Status   C difficile by pcr NEGATIVE  NEGATIVE Final  STOOL CULTURE     Status: None   Collection Time    07/08/13  4:55 PM      Result Value Range Status    Specimen Description STOOL   Final   Special Requests Normal   Final   Culture     Final   Value: NO SALMONELLA, SHIGELLA, CAMPYLOBACTER, YERSINIA, OR E.COLI 0157:H7 ISOLATED     Performed at [facility]   Report Status 07/12/2013 FINAL   Final  CULTURE, BLOOD (ROUTINE X 2)     Status: None   Collection Time    07/21/13  7:45 AM      Result Value Range Status   Specimen Description BLOOD LEFT ARM   Final   Special Requests BOTTLES DRAWN AEROBIC ONLY 10CC   Final   Culture  Setup Time     Final   Value: 07/21/2013 13:39     Performed at [facility]   Culture     Final   Value: NO GROWTH 5 DAYS     Performed at [facility]   Report Status 07/27/2013 FINAL   Final  CULTURE, BLOOD (ROUTINE X 2)     Status: None   Collection Time    07/21/13  7:50 AM      Result Value Range Status   Specimen Description BLOOD LEFT ARM   Final   Special Requests BOTTLES DRAWN AEROBIC ONLY 6CC   Final   Culture  Setup Time     Final   Value: 07/21/2013 13:39     Performed at [facility]   Culture     Final   Value: NO GROWTH 5 DAYS     Performed at [facility]   Report Status 07/27/2013 FINAL   Final  CULTURE, RESPIRATORY (NON-EXPECTORATED)     Status: None   Collection Time    07/21/13  3:10 PM      Result Value Range Status   Specimen Description ENDOTRACHEAL   Final   Special Requests NONE   Final   Gram Stain     Final   Value: FEW WBC PRESENT,BOTH PMN AND MONONUCLEAR     NO SQUAMOUS EPITHELIAL CELLS SEEN     FEW GRAM POSITIVE RODS     RARE GRAM NEGATIVE RODS     RARE GRAM POSITIVE COCCI   Culture     Final   Value: MODERATE ESCHERICHIA COLI     Performed at [facility]   Report Status 07/24/2013 FINAL   Final   Organism ID, Bacteria ESCHERICHIA COLI   Final    Anti-infectives   Start     Dose/Rate Route Frequency Ordered Stop   07/25/13 1115  vancomycin (VANCOCIN) IVPB 1000 mg/200 mL premix     1,000 mg 200 mL/hr over 60  Minutes Intravenous To Surgery 07/25/13 1108 07/25/13 1111   07/22/13 2200  vancomycin (VANCOCIN) IVPB 1000 mg/200 mL premix     1,000 mg 200 mL/hr over 60 Minutes Intravenous Every 12 hours 07/22/13 2056     07/21/13 1000  fluconazole (DIFLUCAN) IVPB 200 mg     200 mg 100 mL/hr over 60 Minutes Intravenous Every 24 hours 07/21/13 0944     07/21/13 0945  metroNIDAZOLE (FLAGYL) IVPB 500 mg  Status:  Discontinued     500 mg 100 mL/hr over 60 Minutes Intravenous Every 8 hours 07/21/13 0933 07/25/13 0918   07/21/13 0945  piperacillin-tazobactam (ZOSYN) IVPB 3.375 g     3.375 g 12.5 mL/hr over 240 Minutes Intravenous 3 times per day 07/21/13 0944     07/21/13 0800  vancomycin (VANCOCIN) IVPB 1000 mg/200 mL premix  Status:  Discontinued     1,000 mg 200 mL/hr over 60 Minutes Intravenous Every 12 hours 07/21/13 0626 07/22/13 1045   07/21/13 0615  ceFEPIme (MAXIPIME) 1 g in dextrose 5 % 50 mL IVPB  Status:  Discontinued     1 g 100 mL/hr over 30 Minutes Intravenous 3 times per day 07/21/13 1610 07/21/13 0919      Assessment: 78 y/o male admitted w/Atrial fibrillation, N/V, Dehydration, Hypokalemia, abd pain, Enteritis due to Norovirus  Anticoagulation: none, has SCDs. Pt has been off due to several GIBs.   Infectious Disease: Strep Pneumo PNA & Ecoli (aspiration) on Vanc/Zosyn/Flucon - suspected PNA and extensive bowel ischemia - at risk for perforation. Afebrile, WBC 12.7 up, CrCl 86  12/8 > Vanc>>  12/9 VT = 18 on 1g q12 12/8 > Zosyn>> 12/8 > Fluconazole>> 12/6 > Metronidazole>> 12/12 12/6 > Cefepime >>12/8  12/8 + strep pneumo urinary antigen 12/8 BC x 2 >> Hegative 12/8 Resp>> Ecoli (Sens to CTX and Zosyn)  Cardiovascular: ICM, severe PAD. EF 35%. New NSTEMIVSS. Trop+ x4. Meds: Amio po, IV Lasix, (No ACEI due to h/o RAS) --12/12 s/p R LE endarterectomy (Dr Early) for ischemic RLE. Pt now considering R AKA vs BKA.  Endocrinology - No DM - CBG's <150 on SSI, TG 210.  Gastrointestinal / Nutrition - severe erosive and ulcerative gastritis/duodenitis/jejunitis - IV PPI q12. He has extensive bowel necrosis (s/p resection/anastomosis/abdominal wall closure), +TPN added. LFTs now trending down slowly (holding lipitor)(but still on amiodarone) --12/8 Exploratory laparotomy with entrectomy (majority of jejunum and proximal ileum)  --12/9 Segmental small bowel resection terminal ileum Two anastomoses to re-establish intestinal continuity  Neurology -  Nephrology - sCr 0.73 - stable, lytes ok but K runs and Mg2g ordered  Pulmonary - COPD with h/o tobacco. Xopenex nebs,   Hematology / Oncology -thrombocytopenia, Plts 41 - no heparin on board- holding plavix. FFP given on 12/9. INR 1.6. DIC panel prelim: abnormal.  Best Practices SCDs    Goal of Therapy:  Vancomycin trough level 15-20 mcg/ml  Plan:  -Vanco trough tomorrow AM on 1000mg /12h. -Zosyn 3.375gm IV q8hr -Fluconazole 200mg  q24   Emanii Bugbee S. Merilynn Finland, PharmD, BCPS Clinical Staff Pharmacist Pager [phone]  Misty Stanley Stillinger 07/28/2013,12:44 PM

## 2013-07-28 NOTE — Progress Notes (Signed)
PULMONARY  / CRITICAL CARE MEDICINE  Name: [name] MRN: [id number] DOB: [birth year]    ADMISSION DATE:  07/18/2013 CONSULTATION DATE: 12-8  REFERRING MD :  [facility] PRIMARY SERVICE: PCCM  CHIEF COMPLAINT:  ARF  BRIEF PATIENT DESCRIPTION:  78 yo male with ischemic cardiomyopathy, severe PAD admitted with abdominal pain and CT abd demonstrates extensive ischemia changes, small bowel distension and was found to be in shock and respiratory failure and was urgently intubated and transferred to ICU.   SIGNIFICANT EVENTS / STUDIES:  12-8 intubated, shock, surgical evaluation 12-8 DNR established  12/8 Exploratory laparotomy with entrectomy (majority of jejunum and proximal ileum) 12/9 Segmental small bowel resection terminal ileum  Two anastomoses to re-establish intestinal continuity 12/11 requires higher PEEP/FIO2 12/12 s/p R LE endarterectomy (Dr Early)    LINES / TUBES: 12- 8 OTT>>12/14 12-8 Rt fem cvl>>12-8 12-8 Lt IJ cvl>> R axillary aline 12/8>> 12/15  CULTURES: 12-8 bc x 2>>ng 12-8 sputum>>e coli S to ceftx / zosyn  ANTIBIOTICS: 12-8 vanc>> 12-8 diflucan>> 12-8 pip tazo>>  SUBJECTIVE:  Extubated yesterday.  Hypoxia overnight.  Remains on NRB.   VITAL SIGNS: Temp:  [97.2 F (36.2 C)-98.6 F (37 C)] 97.3 F (36.3 C) (12/15 0755) Pulse Rate:  [52-97] 83 (12/15 0900) Resp:  [12-29] 25 (12/15 0900) BP: (100-134)/(27-88) 134/45 mmHg (12/15 0900) SpO2:  [83 %-97 %] 97 % (12/15 0900) Arterial Line BP: (90-137)/(36-56) 137/56 mmHg (12/15 0900) FiO2 (%):  [100 %] 100 % (12/15 0900) Weight:  [194 lb 7.1 oz (88.2 kg)] 194 lb 7.1 oz (88.2 kg) (12/15 0453) HEMODYNAMICS: CVP:  [0 mmHg-15 mmHg] 8 mmHg VENTILATOR SETTINGS: Vent Mode:  [-]  FiO2 (%):  [100 %] 100 % INTAKE / OUTPUT: Intake/Output     12/14 0701 - 12/15 0700 12/15 0701 - 12/16 0700   I.V. (mL/kg) 855 (9.7)    NG/GT 60    IV Piggyback 900    TPN 1922.1 88   Total Intake(mL/kg) 3737.1 (42.4) 88 (1)    Urine (mL/kg/hr) 4165 (2) 200 (1.1)   Emesis/NG output 200 (0.1)    Total Output 4365 200   Net -627.9 -112          PHYSICAL EXAMINATION: General:  Elderly male, pleasant, in no distress   Neuro:   Awake, alert, appropriate, weak  HEENT: Edentulous, No JVD/LAN, NRB  Cardiovascular:  s1s2 rrr Lungs:  resps even non labored on NRB, coarse R>L, few scattered bibasilar crackles  Abdomen:  Tense/Tender, OGT with bilious drainage, midline incision  Musculoskeletal:  Wasted musculature Skin:  Rt lower ext  Cool below knee, clear demarcation at proximal foot just below ankle   LABS:  CBC  Recent Labs Lab 07/26/13 0430 07/27/13 0400 07/28/13 0405  WBC 11.0* 9.9 12.7*  HGB 9.2* 9.5* 9.8*  HCT 27.7* 28.5* 29.7*  PLT 35*  32* 34* 41*   Coag's  Recent Labs Lab 07/25/13 0431 07/25/13 1100 07/26/13 0430  APTT 44* 42* 45*  INR 1.59* 1.58* 1.53*   BMET  Recent Labs Lab 07/26/13 0430 07/27/13 0400 07/28/13 0405  NA 142 138 135  K 3.2* 3.5 3.6  CL 111 108 104  CO2 25 23 25   BUN 22 20 19   CREATININE 0.79 0.73 0.73  GLUCOSE 142* 124* 148*   Electrolytes  Recent Labs Lab 07/26/13 0430 07/27/13 0400 07/28/13 0405  CALCIUM 7.1* 7.0* 7.4*  MG 1.9 1.7 1.7  PHOS 2.2* 2.7 2.7   Sepsis Markers  Recent Labs Lab  07/21/13 1010 07/24/13 0900 07/25/13 1000  LATICACIDVEN 10.5* 1.5 1.1   ABG  Recent Labs Lab 07/24/13 0905 07/26/13 0313 07/27/13 2341  PHART 7.383 7.421 7.353  PCO2ART 42.0 38.3 42.8  PO2ART 169.0* 96.6 61.0*   Liver Enzymes  Recent Labs Lab 07/25/13 0330 07/26/13 0430 07/27/13 0400  AST 277* 130* 106*  ALT 694* 393* 237*  ALKPHOS 178* 168* 168*  BILITOT 3.5* 2.8* 2.6*  ALBUMIN 1.6* 1.5* 1.3*   Cardiac Enzymes  Recent Labs Lab 07/24/13 1800 07/25/13 0330 07/26/13 0430  TROPONINI 1.54* 1.22* 0.74*   Glucose  Recent Labs Lab 07/27/13 1203 07/27/13 1620 07/27/13 1955 07/28/13 0010 07/28/13 0349 07/28/13 0752  GLUCAP 125*  105* 135* 135* 140* 141*    Imaging Dg Chest Port 1 View  07/28/2013   CLINICAL DATA:  Respiratory distress, shortness of breath.  EXAM: PORTABLE CHEST - 1 VIEW  COMPARISON:  Chest radiograph July 27, 2013 at 6:24 a.m.  FINDINGS: Cardiac silhouette is scratch the upper limits of normal, status post median sternotomy for apparent Coronary artery bypass grafting. Increasing central pulmonary vasculature congestion, worsening interstitial prominence with new right lower lobe airspace opacity. Small to moderate right pleural effusion extending into the fissure. Small left pleural effusion.  Interval apparent extubation. Left internal jugular central venous catheter with distal tip projecting in proximal superior vena cava. Nasogastric tube in place with side port projecting at gastroesophageal junction. Right upper extremity catheter may reflect a PICC, distal tip appears to terminate in the right axilla and was present previously. Multiple EKG lines overlie the patient and may obscure subtle underlying pathology.  IMPRESSION: Worsening interstitial and alveolar airspace opacities may reflect fluid overload, with increasing small to moderate right, small left pleural effusions.  Apparent interval extubation without change in remaining life support lines.   Electronically Signed   By: Awilda Metro   On: 07/28/2013 00:13   Dg Chest Port 1 View  07/27/2013   CLINICAL DATA:  Check endotracheal tube placement  EXAM: PORTABLE CHEST - 1 VIEW  COMPARISON:  07/26/2013  FINDINGS: Cardiac shadow is stable. A left central venous line is again noted in the mid superior vena cava. The endotracheal tube is noted 6.6 cm above the carinal. A nasogastric catheter is seen within the stomach. Stable changes are noted in the bases bilaterally. No new focal abnormality is seen.  IMPRESSION: No significant interval change from the prior exam.   Electronically Signed   By: Alcide Clever M.D.   On: 07/27/2013 08:09     ASSESSMENT / PLAN:  PULMONARY A:Acute resp failure - worsening hypoxia/ALI 12/11 G neg- aspn pneumonia COPD with tobacco abuse hx Resp + metab alkalosis P:   BD's  Diurese as tol  Continue supplemental O2 - goal wean off NRB  Aggressive pulmonary hygiene    CARDIOVASCULAR A: Shock presumed sepsis, SIRS response - improved CAD/ isch cardiomyopathy -EF 35% Vasculopath -ischemic RLE > s/p endarterectomy 12/12 NSTEMI Volume overload/ pulm edema  P:  femoral endarterectomy performed 12/12 VVS following - ?? R AKA v BKA Plavix held for low plts F/u CK  Diurese as Scr/ BP tolerate    RENAL A:  Acidosis, hyperkalemia, mild ARF, h/o ras Now Hypokalemic P:   F/u chem  Avoid acei/ contrast  GASTROINTESTINAL A:  SBO/Ischemia s/p extensive SB resection Shock liver -LFTs improving H/o antral ulcers P:   CCS following continue TNA    HEMATOLOGIC A:  Thrombocytopenia Coagulopathy-  With low fibrinogen, low grade DIC  picture P:   scds  Follow CBC   INFECTIOUS A:  R/o bacterial translocation, at risk perf P:   Micro/abx as above  Cultures remain neg   ENDOCRINE A:  Hypoglycemia, cortisol > 150 P:   SSI   NEUROLOGIC A:  Sedation, MS improved P:   fent for pain   TODAY'S SUMMARY: Guarded prognosis given extensive bowel ischemia & RLE ischemia s/p endarterectomy. Limited code. Likely plan AKA v BKA in next day or so per VVS.     Danford Bad, NP 07/28/2013  9:07 AM Pager: [phone] or [phone]  Reviewed above, examined pt, and agree with assessment/plan.  Respiratory status stable.  Will d/c aline.  Will need to determine if he needs surgery for Rt leg and if he can tolerate surgery at this time.  Coralyn Helling, MD [facility] Pulmonary/Critical Care 07/28/2013, 11:21 AM Pager:  [phone] After 3pm call: [phone]

## 2013-07-28 NOTE — Progress Notes (Signed)
CCM notified of pt K+ 3.2 and increased ectopy. Pt sats in the upper 80's on 50% venti mask upon arrival, RT notified and breathing treatment is being administered at this time. Will continue to monitor and will reassess need for non-rebreather. Pt not complaining of pain at this time and states his right foot only hurts when touched. Pt safety maintained.

## 2013-07-28 NOTE — Progress Notes (Signed)
3 Days Post-Op  Subjective: Pulmonary edema last night, on NRB now  Objective: Vital signs in last 24 hours: Temp:  [97.2 F (36.2 C)-98.6 F (37 C)] 97.3 F (36.3 C) (12/15 0755) Pulse Rate:  [52-97] 52 (12/15 0800) Resp:  [12-29] 20 (12/15 0800) BP: (100-134)/(27-88) 133/27 mmHg (12/15 0800) SpO2:  [83 %-97 %] 97 % (12/15 0800) Arterial Line BP: (90-134)/(36-51) 133/51 mmHg (12/15 0800) FiO2 (%):  [100 %] 100 % (12/15 0800) Weight:  [194 lb 7.1 oz (88.2 kg)] 194 lb 7.1 oz (88.2 kg) (12/15 0453) Last BM Date: 07/18/13  Intake/Output from previous day: 12/14 0701 - 12/15 0700 In: 3737.1 [I.V.:855; NG/GT:60; IV Piggyback:900; TPN:1922.1] Out: 4365 [Urine:4165; Emesis/NG output:200] Intake/Output this shift: Total I/O In: 88 [TPN:88] Out: 200 [Urine:200]  General appearance: alert and cooperative Resp: some rales Cardio: regular rate and rhythm GI: soft, NT, incision CDI, +BS  Lab Results:   Recent Labs  07/27/13 0400 07/28/13 0405  WBC 9.9 12.7*  HGB 9.5* 9.8*  HCT 28.5* 29.7*  PLT 34* 41*   BMET  Recent Labs  07/27/13 0400 07/28/13 0405  NA 138 135  K 3.5 3.6  CL 108 104  CO2 23 25  GLUCOSE 124* 148*  BUN 20 19  CREATININE 0.73 0.73  CALCIUM 7.0* 7.4*   PT/INR  Recent Labs  07/25/13 1100 07/26/13 0430  LABPROT 18.4* 18.0*  INR 1.58* 1.53*   ABG  Recent Labs  07/26/13 0313 07/27/13 2341  PHART 7.421 7.353  HCO3 24.4* 23.9    Studies/Results: Dg Chest Port 1 View  07/28/2013   CLINICAL DATA:  Respiratory distress, shortness of breath.  EXAM: PORTABLE CHEST - 1 VIEW  COMPARISON:  Chest radiograph July 27, 2013 at 6:24 a.m.  FINDINGS: Cardiac silhouette is scratch the upper limits of normal, status post median sternotomy for apparent Coronary artery bypass grafting. Increasing central pulmonary vasculature congestion, worsening interstitial prominence with new right lower lobe airspace opacity. Small to moderate right pleural effusion  extending into the fissure. Small left pleural effusion.  Interval apparent extubation. Left internal jugular central venous catheter with distal tip projecting in proximal superior vena cava. Nasogastric tube in place with side port projecting at gastroesophageal junction. Right upper extremity catheter may reflect a PICC, distal tip appears to terminate in the right axilla and was present previously. Multiple EKG lines overlie the patient and may obscure subtle underlying pathology.  IMPRESSION: Worsening interstitial and alveolar airspace opacities may reflect fluid overload, with increasing small to moderate right, small left pleural effusions.  Apparent interval extubation without change in remaining life support lines.   Electronically Signed   By: Awilda Metro   On: 07/28/2013 00:13   Dg Chest Port 1 View  07/27/2013   CLINICAL DATA:  Check endotracheal tube placement  EXAM: PORTABLE CHEST - 1 VIEW  COMPARISON:  07/26/2013  FINDINGS: Cardiac shadow is stable. A left central venous line is again noted in the mid superior vena cava. The endotracheal tube is noted 6.6 cm above the carinal. A nasogastric catheter is seen within the stomach. Stable changes are noted in the bases bilaterally. No new focal abnormality is seen.  IMPRESSION: No significant interval change from the prior exam.   Electronically Signed   By: Alcide Clever M.D.   On: 07/27/2013 08:09    Anti-infectives: Anti-infectives   Start     Dose/Rate Route Frequency Ordered Stop   07/25/13 1115  vancomycin (VANCOCIN) IVPB 1000 mg/200 mL premix  1,000 mg 200 mL/hr over 60 Minutes Intravenous To Surgery 07/25/13 1108 07/25/13 1111   07/22/13 2200  vancomycin (VANCOCIN) IVPB 1000 mg/200 mL premix     1,000 mg 200 mL/hr over 60 Minutes Intravenous Every 12 hours 07/22/13 2056     07/21/13 1000  fluconazole (DIFLUCAN) IVPB 200 mg     200 mg 100 mL/hr over 60 Minutes Intravenous Every 24 hours 07/21/13 0944     07/21/13 0945   metroNIDAZOLE (FLAGYL) IVPB 500 mg  Status:  Discontinued     500 mg 100 mL/hr over 60 Minutes Intravenous Every 8 hours 07/21/13 0933 07/25/13 0918   07/21/13 0945  piperacillin-tazobactam (ZOSYN) IVPB 3.375 g     3.375 g 12.5 mL/hr over 240 Minutes Intravenous 3 times per day 07/21/13 0944     07/21/13 0800  vancomycin (VANCOCIN) IVPB 1000 mg/200 mL premix  Status:  Discontinued     1,000 mg 200 mL/hr over 60 Minutes Intravenous Every 12 hours 07/21/13 0626 07/22/13 1045   07/21/13 0615  ceFEPIme (MAXIPIME) 1 g in dextrose 5 % 50 mL IVPB  Status:  Discontinued     1 g 100 mL/hr over 30 Minutes Intravenous 3 times per day 07/21/13 1610 07/21/13 0919      Assessment/Plan: POD#6 S/P SB anastamosis and closure - await return of bowel function, continue TNA Pulm edema - per CCM S/P thrombectomy - per [facility] VTE - PLTs only 41k  LOS: 10 days    Amybeth Sieg E 07/28/2013

## 2013-07-28 NOTE — Significant Event (Signed)
NG tube advance per recommendations from [facility] obtained. No complication when advanced. Placement auscultated again. Renato Spellman, Charity fundraiser.

## 2013-07-28 NOTE — Progress Notes (Signed)
NUTRITION FOLLOW UP  DOCUMENTATION CODES Per approved criteria  -Severe malnutrition in the context of chronic illness   Intervention:    TPN per pharmacy; may need to increase calorie/protein provision over the next few days, to ensure adequate intake for healing, pending plans for surgery and re-estimated needs. RD to follow for nutrition care plan  Nutrition Dx:   Inadequate oral intake now related to inability to eat as evidenced by NPO status, ongoing  Goal:   TPN to meet > 90% of estimated nutrition needs, progressing  Monitor:   TPN presciption, respiratory status, weight, labs, I/O's  Assessment:   Patient with PMH of COPD, CVA and CAD; previous admitted with enteritis (thought to be Norovirus); presented again with ongoing nausea, occasional vomiting and one loose BM per day; + severe fatigue and abdominal pain  Patient s/p emergent procedure 12/8: EXPLORATORY LAPAROTOMY ENTRECTOMY (MAJORITY OF JEJUNUM & PROXIMAL ILEUM)  Patient was extubated on 12/14. Per MD notes, patient with a guarded prognosis given extensive bowel ischemia & RLE ischemia s/p endarterectomy. Likely plan AKA vs BKA in next day or so per [facility].   Patient continues to receive TPN with Clinimix E 5/15 @ 83 ml/hr and lipids @ 10 ml/hr. Provides 1894 kcal and 100 grams protein per day. Meets 95% minimum estimated energy needs and 91% minimum estimated protein needs.  Patient with some signs of refeeding syndrome last week with initiation of TPN; phosphorus, magnesium, and potassium are WNL today.  Height: Ht Readings from Last 1 Encounters:  07/18/13 6' (1.829 m)    Weight Status:   Wt Readings from Last 1 Encounters:  07/28/13 194 lb 7.1 oz (88.2 kg)    Body mass index is 26.37 kg/(m^2).  Re-estimated needs:  Kcal: 2000-2200 Protein: 110-120 gm Fluid: per MD  Skin: Stage II pressure ulcer to sacrum  Diet Order: NPO   Intake/Output Summary (Last 24 hours) at 07/28/13 1202 Last data filed  at 07/28/13 1100  Gross per 24 hour  Intake 3284.1 ml  Output   4935 ml  Net -1650.9 ml    Labs:   Recent Labs Lab 07/26/13 0430 07/27/13 0400 07/28/13 0405  NA 142 138 135  K 3.2* 3.5 3.6  CL 111 108 104  CO2 25 23 25   BUN 22 20 19   CREATININE 0.79 0.73 0.73  CALCIUM 7.1* 7.0* 7.4*  MG 1.9 1.7 1.7  PHOS 2.2* 2.7 2.7  GLUCOSE 142* 124* 148*    CBG (last 3)   Recent Labs  07/28/13 0010 07/28/13 0349 07/28/13 0752  GLUCAP 135* 140* 141*    Scheduled Meds: . amiodarone  200 mg Oral Daily  . antiseptic oral rinse  15 mL Mouth Rinse QID  . chlorhexidine  15 mL Mouth Rinse BID  . fluconazole (DIFLUCAN) IV  200 mg Intravenous Q24H  . furosemide  40 mg Intravenous Q8H  . insulin aspart  0-9 Units Subcutaneous Q4H  . levalbuterol  0.63 mg Nebulization Q6H WA  . magnesium sulfate 1 - 4 g bolus IVPB  2 g Intravenous Once  . pantoprazole (PROTONIX) IV  40 mg Intravenous Q12H  . piperacillin-tazobactam (ZOSYN)  IV  3.375 g Intravenous Q8H  . potassium chloride  10 mEq Intravenous TID  . sodium chloride  10 mL Intravenous Q12H  . vancomycin  1,000 mg Intravenous Q12H    Continuous Infusions: . sodium chloride 30 mL/hr at 07/27/13 2212  . Marland KitchenTPN (CLINIMIX-E) Adult 83 mL/hr at 07/28/13 1100   And  .  fat emulsion 120 mL (07/28/13 1100)  . Marland KitchenTPN (CLINIMIX-E) Adult     And  . fat emulsion      Joaquin Courts, RD, LDN, CNSC Pager [phone] After Hours Pager [phone]

## 2013-07-28 NOTE — Progress Notes (Signed)
eLink Physician-Brief Progress Note Patient Name: [name] DOB: 1934/11/04 MRN: [id number]  Date of Service  07/28/2013   HPI/Events of Note   Hypokalemia  eICU Interventions  KCl 20 meq.      Lethia Donlon 07/28/2013, 7:41 PM

## 2013-07-29 ENCOUNTER — Encounter (HOSPITAL_COMMUNITY): Payer: Medicare Other | Admitting: Anesthesiology

## 2013-07-29 ENCOUNTER — Inpatient Hospital Stay (HOSPITAL_COMMUNITY): Payer: Medicare Other

## 2013-07-29 ENCOUNTER — Encounter (HOSPITAL_COMMUNITY): Payer: Self-pay | Admitting: Vascular Surgery

## 2013-07-29 ENCOUNTER — Inpatient Hospital Stay (HOSPITAL_COMMUNITY): Payer: Medicare Other | Admitting: Anesthesiology

## 2013-07-29 ENCOUNTER — Encounter (HOSPITAL_COMMUNITY)
Admission: EM | Disposition: A | Discharge status: Other Acute Inpatient Hospital | Payer: Self-pay | Source: Home / Self Care | Attending: Internal Medicine

## 2013-07-29 DIAGNOSIS — I5043 Acute on chronic combined systolic (congestive) and diastolic (congestive) heart failure: Secondary | ICD-10-CM

## 2013-07-29 DIAGNOSIS — I70229 Atherosclerosis of native arteries of extremities with rest pain, unspecified extremity: Secondary | ICD-10-CM

## 2013-07-29 HISTORY — PX: AMPUTATION: SHX166

## 2013-07-29 LAB — GLUCOSE, CAPILLARY
Glucose-Capillary: 126 mg/dL — ABNORMAL HIGH (ref 70–99)
Glucose-Capillary: 127 mg/dL — ABNORMAL HIGH (ref 70–99)
Glucose-Capillary: 127 mg/dL — ABNORMAL HIGH (ref 70–99)
Glucose-Capillary: 128 mg/dL — ABNORMAL HIGH (ref 70–99)
Glucose-Capillary: 136 mg/dL — ABNORMAL HIGH (ref 70–99)

## 2013-07-29 LAB — HEPARIN INDUCED THROMBOCYTOPENIA PNL
Heparin Induced Plt Ab: NEGATIVE
Patient O.D.: 0.041
UFH High Dose UFH H: 16 % Release
UFH Low Dose 0.1 IU/mL: 14 % Release
UFH Low Dose 0.5 IU/mL: 8 % Release
UFH SRA Result: NEGATIVE

## 2013-07-29 LAB — BASIC METABOLIC PANEL
BUN: 20 mg/dL (ref 6–23)
BUN: 24 mg/dL — ABNORMAL HIGH (ref 6–23)
CO2: 29 mEq/L (ref 19–32)
CO2: 30 mEq/L (ref 19–32)
Calcium: 7.1 mg/dL — ABNORMAL LOW (ref 8.4–10.5)
Chloride: 102 mEq/L (ref 96–112)
Creatinine, Ser: 0.73 mg/dL (ref 0.50–1.35)
GFR calc non Af Amer: 88 mL/min — ABNORMAL LOW (ref >90)
Glucose, Bld: 131 mg/dL — ABNORMAL HIGH (ref 70–99)
Glucose, Bld: 145 mg/dL — ABNORMAL HIGH (ref 70–99)
Potassium: 3.4 mEq/L — ABNORMAL LOW (ref 3.5–5.1)
Sodium: 136 mEq/L (ref 135–145)
Sodium: 135 mEq/L (ref 135–145)

## 2013-07-29 LAB — CBC
HCT: 28 % — ABNORMAL LOW (ref 39.0–52.0)
HCT: 24.7 % — ABNORMAL LOW (ref 39.0–52.0)
Hemoglobin: 9.2 g/dL — ABNORMAL LOW (ref 13.0–17.0)
MCH: 29.4 pg (ref 26.0–34.0)
MCH: 30.5 pg (ref 26.0–34.0)
MCHC: 32.9 g/dL (ref 30.0–36.0)
MCHC: 34.4 g/dL (ref 30.0–36.0)
MCV: 89.5 fL (ref 78.0–100.0)
MCV: 88.5 fL (ref 78.0–100.0)
Platelets: 47 10*3/uL — ABNORMAL LOW (ref 150–400)
RBC: 3.13 MIL/uL — ABNORMAL LOW (ref 4.22–5.81)
RDW: 18.8 % — ABNORMAL HIGH (ref 11.5–15.5)

## 2013-07-29 LAB — POCT I-STAT 3, ART BLOOD GAS (G3+)
Acid-Base Excess: 4 mmol/L — ABNORMAL HIGH (ref 0.0–2.0)
Patient temperature: 41.09
pO2, Arterial: 123 mmHg — ABNORMAL HIGH (ref 80.0–100.0)

## 2013-07-29 LAB — MAGNESIUM: Magnesium: 1.7 mg/dL (ref 1.5–2.5)

## 2013-07-29 LAB — VANCOMYCIN, TROUGH: Vancomycin Tr: 22.4 ug/mL — ABNORMAL HIGH (ref 10.0–20.0)

## 2013-07-29 SURGERY — AMPUTATION, ABOVE KNEE
Anesthesia: General | Site: Leg Lower | Laterality: Right

## 2013-07-29 MED ORDER — PROPOFOL 10 MG/ML IV BOLUS
INTRAVENOUS | Status: DC | PRN
  Administered 2013-07-29: 40 mg via INTRAVENOUS

## 2013-07-29 MED ORDER — MAGNESIUM SULFATE 40 MG/ML IJ SOLN
2.0000 g | Freq: Once | INTRAMUSCULAR | Status: AC
  Administered 2013-07-29: 2 g via INTRAVENOUS
  Filled 2013-07-29: qty 50

## 2013-07-29 MED ORDER — FENTANYL CITRATE 0.05 MG/ML IJ SOLN
INTRAMUSCULAR | Status: DC | PRN
  Administered 2013-07-29: 150 ug via INTRAVENOUS
  Administered 2013-07-29 (×2): 100 ug via INTRAVENOUS

## 2013-07-29 MED ORDER — FAT EMULSION 20 % IV EMUL
250.0000 mL | INTRAVENOUS | Status: AC
  Administered 2013-07-29: 250 mL via INTRAVENOUS
  Filled 2013-07-29: qty 250

## 2013-07-29 MED ORDER — LACTATED RINGERS IV SOLN
INTRAVENOUS | Status: DC | PRN
  Administered 2013-07-29: 11:00:00 via INTRAVENOUS

## 2013-07-29 MED ORDER — POTASSIUM CHLORIDE 10 MEQ/50ML IV SOLN
10.0000 meq | INTRAVENOUS | Status: AC
  Administered 2013-07-29 (×2): 10 meq via INTRAVENOUS
  Filled 2013-07-29 (×2): qty 50

## 2013-07-29 MED ORDER — MIDAZOLAM HCL 2 MG/2ML IJ SOLN: 1.0000 mg | INTRAMUSCULAR | Status: DC | PRN

## 2013-07-29 MED ORDER — MIDAZOLAM HCL 5 MG/5ML IJ SOLN
INTRAMUSCULAR | Status: DC | PRN
  Administered 2013-07-29: 2 mg via INTRAVENOUS

## 2013-07-29 MED ORDER — 0.9 % SODIUM CHLORIDE (POUR BTL) OPTIME
TOPICAL | Status: DC | PRN
  Administered 2013-07-29: 1000 mL

## 2013-07-29 MED ORDER — FENTANYL CITRATE 0.05 MG/ML IJ SOLN
50.0000 ug | INTRAMUSCULAR | Status: DC | PRN
  Filled 2013-07-29: qty 2

## 2013-07-29 MED ORDER — TRACE MINERALS CR-CU-F-FE-I-MN-MO-SE-ZN IV SOLN
INTRAVENOUS | Status: AC
  Administered 2013-07-29: 18:00:00 via INTRAVENOUS
  Filled 2013-07-29: qty 2400

## 2013-07-29 MED ORDER — PHENYLEPHRINE HCL 10 MG/ML IJ SOLN
10.0000 mg | INTRAVENOUS | Status: DC | PRN
  Administered 2013-07-29: 25 ug/min via INTRAVENOUS

## 2013-07-29 MED ORDER — ROCURONIUM BROMIDE 100 MG/10ML IV SOLN
INTRAVENOUS | Status: DC | PRN
  Administered 2013-07-29: 50 mg via INTRAVENOUS

## 2013-07-29 MED ORDER — VANCOMYCIN HCL IN DEXTROSE 750-5 MG/150ML-% IV SOLN
750.0000 mg | Freq: Two times a day (BID) | INTRAVENOUS | Status: DC
  Administered 2013-07-29 – 2013-07-31 (×5): 750 mg via INTRAVENOUS
  Filled 2013-07-29 (×7): qty 150

## 2013-07-29 MED ORDER — ETOMIDATE 2 MG/ML IV SOLN
INTRAVENOUS | Status: DC | PRN
  Administered 2013-07-29: 6 mg via INTRAVENOUS

## 2013-07-29 SURGICAL SUPPLY — 49 items
BANDAGE ELASTIC 4 VELCRO ST LF (GAUZE/BANDAGES/DRESSINGS) ×2 IMPLANT
BANDAGE ELASTIC 6 VELCRO ST LF (GAUZE/BANDAGES/DRESSINGS) ×2 IMPLANT
BANDAGE ESMARK 6X9 LF (GAUZE/BANDAGES/DRESSINGS) IMPLANT
BANDAGE GAUZE ELAST BULKY 4 IN (GAUZE/BANDAGES/DRESSINGS) ×3 IMPLANT
BNDG CMPR 9X6 STRL LF SNTH (GAUZE/BANDAGES/DRESSINGS)
BNDG COHESIVE 6X5 TAN STRL LF (GAUZE/BANDAGES/DRESSINGS) ×2 IMPLANT
BNDG ESMARK 6X9 LF (GAUZE/BANDAGES/DRESSINGS)
CANISTER SUCTION 2500CC (MISCELLANEOUS) ×2 IMPLANT
CLIP TI MEDIUM 6 (CLIP) IMPLANT
COVER SURGICAL LIGHT HANDLE (MISCELLANEOUS) ×2 IMPLANT
CUFF TOURNIQUET SINGLE 34IN LL (TOURNIQUET CUFF) IMPLANT
CUFF TOURNIQUET SINGLE 44IN (TOURNIQUET CUFF) IMPLANT
DRAIN CHANNEL 19F RND (DRAIN) IMPLANT
DRAPE ORTHO SPLIT 77X108 STRL (DRAPES) ×4
DRAPE PROXIMA HALF (DRAPES) ×2 IMPLANT
DRAPE SURG ORHT 6 SPLT 77X108 (DRAPES) ×2 IMPLANT
DRSG ADAPTIC 3X8 NADH LF (GAUZE/BANDAGES/DRESSINGS) ×2 IMPLANT
ELECT REM PT RETURN 9FT ADLT (ELECTROSURGICAL) ×2
ELECTRODE REM PT RTRN 9FT ADLT (ELECTROSURGICAL) ×1 IMPLANT
EVACUATOR SILICONE 100CC (DRAIN) IMPLANT
GAUZE SPONGE 4X4 16PLY XRAY LF (GAUZE/BANDAGES/DRESSINGS) ×2 IMPLANT
GLOVE BIOGEL PI IND STRL 7.5 (GLOVE) ×1 IMPLANT
GLOVE BIOGEL PI INDICATOR 7.5 (GLOVE) ×1
GLOVE SURG SS PI 7.5 STRL IVOR (GLOVE) ×2 IMPLANT
GOWN PREVENTION PLUS XXLARGE (GOWN DISPOSABLE) ×2 IMPLANT
GOWN STRL NON-REIN LRG LVL3 (GOWN DISPOSABLE) ×6 IMPLANT
KIT BASIN OR (CUSTOM PROCEDURE TRAY) ×2 IMPLANT
KIT ROOM TURNOVER OR (KITS) ×2 IMPLANT
MARKER SKIN DUAL TIP RULER LAB (MISCELLANEOUS) ×1 IMPLANT
NS IRRIG 1000ML POUR BTL (IV SOLUTION) ×2 IMPLANT
PACK GENERAL/GYN (CUSTOM PROCEDURE TRAY) ×2 IMPLANT
PAD ARMBOARD 7.5X6 YLW CONV (MISCELLANEOUS) ×4 IMPLANT
PADDING CAST COTTON 6X4 STRL (CAST SUPPLIES) IMPLANT
SAW GIGLI STERILE 20 (MISCELLANEOUS) ×2 IMPLANT
SPONGE GAUZE 4X4 12PLY (GAUZE/BANDAGES/DRESSINGS) ×3 IMPLANT
STOCKINETTE IMPERVIOUS LG (DRAPES) ×2 IMPLANT
SUT ETHILON 3 0 PS 1 (SUTURE) IMPLANT
SUT SILK 0 TIES 10X30 (SUTURE) IMPLANT
SUT SILK 2 0 (SUTURE) ×2
SUT SILK 2 0 SH (SUTURE) ×1 IMPLANT
SUT SILK 2-0 18XBRD TIE 12 (SUTURE) ×1 IMPLANT
SUT SILK 3 0 (SUTURE)
SUT SILK 3-0 18XBRD TIE 12 (SUTURE) IMPLANT
SUT VIC AB 2-0 CT1 18 (SUTURE) ×4 IMPLANT
TAPE UMBILICAL COTTON 1/8X30 (MISCELLANEOUS) ×2 IMPLANT
TOWEL OR 17X24 6PK STRL BLUE (TOWEL DISPOSABLE) ×2 IMPLANT
TOWEL OR 17X26 10 PK STRL BLUE (TOWEL DISPOSABLE) ×2 IMPLANT
UNDERPAD 30X30 INCONTINENT (UNDERPADS AND DIAPERS) ×2 IMPLANT
WATER STERILE IRR 1000ML POUR (IV SOLUTION) ×2 IMPLANT

## 2013-07-29 NOTE — Progress Notes (Signed)
Patient examined and I agree with the assessment and plan  Obdulio Mash, MD, MPH, FACS Pager: [phone]  07/29/2013 12:42 PM  

## 2013-07-29 NOTE — Transfer of Care (Signed)
Immediate Anesthesia Transfer of Care Note  Patient: [name]  Procedure(s) Performed: Procedure(s): AMPUTATION ABOVE KNEE (Right)  Patient Location: SICU  Anesthesia Type:General  Level of Consciousness: sedated and unresponsive  Airway & Oxygen Therapy: Patient remains intubated per anesthesia plan and Patient placed on Ventilator (see vital sign flow sheet for setting)  Post-op Assessment: Report given to PACU RN and Post -op Vital signs reviewed and stable  Post vital signs: Reviewed and stable  Complications: No apparent anesthesia complications

## 2013-07-29 NOTE — Progress Notes (Signed)
Attempting to wean fio2 to 80%, sats 98%.  RN aware.  RN called CCM for vent orders.

## 2013-07-29 NOTE — H&P (View-Only) (Signed)
Subjective: Interval History: none.. the patient reports significant pain in his right foot. He is alert and extubated on 100% nonrebreather  Objective: Vital signs in last 24 hours: Temp:  [97.2 F (36.2 C)-97.9 F (36.6 C)] 97.3 F (36.3 C) (12/16 0816) Pulse Rate:  [71-92] 77 (12/16 0600) Resp:  [14-27] 27 (12/16 0600) BP: (98-134)/(34-79) 107/42 mmHg (12/16 0600) SpO2:  [86 %-99 %] 98 % (12/16 0736) Arterial Line BP: (113-138)/(37-83) 124/38 mmHg (12/16 0600) FiO2 (%):  [35 %-100 %] 100 % (12/16 0507) Weight:  [181 lb (82.1 kg)] 181 lb (82.1 kg) (12/16 0500)  Intake/Output from previous day: 12/15 0701 - 12/16 0700 In: 3823.4 [I.V.:633.3; NG/GT:90; IV Piggyback:1012.5; TPN:2087.6] Out: 6830 [Urine:6580; Emesis/NG output:250] Intake/Output this shift:    Abdomen soft nontender. Right foot clearly not viable with bulla present. He is tender up onto his calf. The thigh is nontender he has normal skin coloration. Does have a palpable right femoral pulse.  Lab Results:  Recent Labs  07/28/13 0405 07/29/13 0400  WBC 12.7* 13.0*  HGB 9.8* 9.2*  HCT 29.7* 28.0*  PLT 41* 46*   BMET  Recent Labs  07/28/13 1740 07/29/13 0400  NA 138 136  K 3.2* 3.4*  CL 103 102  CO2 27 29  GLUCOSE 161* 131*  BUN 19 20  CREATININE 0.75 0.73  CALCIUM 7.3* 7.4*    Studies/Results: Dg Chest 2 View  07/06/2013   CLINICAL DATA:  Epigastric pain for 1 week, low blood pressure  EXAM: CHEST  2 VIEW  COMPARISON:  06/25/2013  FINDINGS: Heart size mildly enlarged. Heavy aortic arch calcification. Vascular pattern normal. Mild diffuse interstitial change most prominent in the right middle lobe and left base. Unchanged appearance when compared to prior study, except for mild decrease in the overall prominence of interstitial change. Right internal jugular catheter has been removed.  IMPRESSION: Decreased interstitial conspicuity suggesting resolving interstitial pulmonary edema.   Electronically  Signed   By: Esperanza Heir M.D.   On: 07/06/2013 18:50   Dg Abd 1 View  07/07/2013   CLINICAL DATA:  Increased abdominal pain, evaluate for ileus  EXAM: ABDOMEN - 1 VIEW  COMPARISON:  06/17/2013  FINDINGS: Mild nonspecific gas distention of the bowel. Negative for significant obstruction or ileus. Extensive aortoiliac calcifications noted. Bilateral renal and common iliac stents present. No acute osseous finding.  IMPRESSION: Negative for significant obstruction or ileus.   Electronically Signed   By: Ruel Favors M.D.   On: 07/07/2013 08:19   US Abdomen Complete  07/08/2013   CLINICAL DATA:  Nausea.  Abdominal pain.  EXAM: ULTRASOUND ABDOMEN COMPLETE  COMPARISON:  None.  FINDINGS: Gallbladder  Several small less than 1 cm gallstones noted as well as minimal amount of sludge. No evidence of gallbladder wall thickening. No sonographic Murphy sign noted.  Common bile duct  Diameter: 4 mm  Liver  No focal lesion identified. Within normal limits in parenchymal echogenicity.  IVC  No abnormality visualized.  Pancreas  Visualized portion unremarkable.  Spleen  Size and appearance within normal limits.  Right Kidney  Length: 11.0 cm. Echogenicity within normal limits. No mass or hydronephrosis visualized.  Left Kidney  Length: 12.4 cm. Echogenicity within normal limits. No mass or hydronephrosis visualized.  Abdominal aorta  No aneurysm visualized.  IMPRESSION: Cholelithiasis. No sonographic signs of cholecystitis, biliary dilatation, or other acute findings.   Electronically Signed   By: Myles Rosenthal M.D.   On: 07/08/2013 20:26   Ct Abdomen Pelvis  W Contrast  07/18/2013   CLINICAL DATA:  Vomiting. Recently discharged after being admitted for peptic ulcers.  EXAM: CT ABDOMEN AND PELVIS WITH CONTRAST  TECHNIQUE: Multidetector CT imaging of the abdomen and pelvis was performed using the standard protocol following bolus administration of intravenous contrast.  CONTRAST:  OMNIPAQUE IOHEXOL 300 MG/ML  SOLN   COMPARISON:  06/17/2013.  FINDINGS: Multiple sigmoid colon diverticula are again demonstrated without evidence of diverticulitis. Multiple normal caliber fluid-filled loops of small bowel. Diffuse low density wall thickening in the duodenal bulb, measuring 1.2 cm in thickness on image number 26. This is producing moderate luminal narrowing. Tiny gallstones in the gallbladder. The largest measures 4 mm in maximum diameter. No gallbladder wall thickening or pericholecystic fluid.  Inhomogeneous enhancement of the liver and spleen with no discrete liver mass seen. 2.6 x 2.1 cm left adrenal mass with low density components. This was previously shown to measure 5 Hounsfield units without intravenous contrast, compatible with a benign adenoma. Small left renal cysts. Unremarkable right kidney, urinary bladder and prostate gland. No enlarged lymph nodes. No evidence of appendicitis. Dense atheromatous arterial calcifications. Bilateral renal artery stents. The no significant change in mild bibasilar atelectasis/scarring. Mild lumbar lower thoracic spine degenerative changes.  IMPRESSION: 1. Do tonight is involving the duodenal bulb with moderate luminal narrowing. 2. Normal caliber fluid-filled small bowel loops. This can be seen with gastroenteritis. 3. Cholelithiasis without evidence of cholecystitis. 4. Extensive arterial calcifications with bilateral renal artery stents. 5. Sigmoid diverticulosis. 6. Stable left adrenal adenoma.   Electronically Signed   By: Gordan Payment M.D.   On: 07/18/2013 18:27   US Renal  07/07/2013   CLINICAL DATA:  Renal failure  EXAM: RENAL/URINARY TRACT ULTRASOUND COMPLETE  COMPARISON:  CT abdomen and pelvis 06/17/2013  FINDINGS: Right Kidney  Length: 10.3 cm. Normal cortical thickness and echogenicity for age. . 3 mm echogenic non shadowing focus mid right kidney corresponding the calculus on prior CT. No mass or hydronephrosis.  Left Kidney  Length: 11.5 cm. Normal cortical thickness and  echogenicity for age. No mass, hydronephrosis or shadowing calcification.  Bladder  Appears normal for degree of bladder distention.  IMPRESSION: 3 mm nonobstructing mid right renal calculus.  Otherwise negative exam.   Electronically Signed   By: Ulyses Southward M.D.   On: 07/07/2013 08:48   Nm Hepato W/eject Fract  07/11/2013   CLINICAL DATA:  Pain.  Nausea and vomiting.  EXAM: NUCLEAR MEDICINE HEPATOBILIARY IMAGING WITH GALLBLADDER EF  TECHNIQUE: Sequential images of the abdomen were obtained out to 60 minutes following intravenous administration of radiopharmaceutical. After slow intravenous infusion of 1.67 micrograms Cholecystokinin, gallbladder ejection fraction was determined.  COMPARISON:  Ultrasound 07/08/2013.  RADIOPHARMACEUTICALS:  16mCiTc-54m Choletec  FINDINGS: Liver, gallbladder, biliary system, and bowel appear normal. At 30 min, normal ejection fraction is greater than 30%.  The patient did not experience symptoms during CCK infusion.  IMPRESSION: Normal exam.   Electronically Signed   By: Maisie Fus  Register   On: 07/11/2013 12:50   Dg Chest Port 1 View  07/28/2013   CLINICAL DATA:  Respiratory distress, shortness of breath.  EXAM: PORTABLE CHEST - 1 VIEW  COMPARISON:  Chest radiograph July 27, 2013 at 6:24 a.m.  FINDINGS: Cardiac silhouette is scratch the upper limits of normal, status post median sternotomy for apparent Coronary artery bypass grafting. Increasing central pulmonary vasculature congestion, worsening interstitial prominence with new right lower lobe airspace opacity. Small to moderate right pleural effusion extending into the  fissure. Small left pleural effusion.  Interval apparent extubation. Left internal jugular central venous catheter with distal tip projecting in proximal superior vena cava. Nasogastric tube in place with side port projecting at gastroesophageal junction. Right upper extremity catheter may reflect a PICC, distal tip appears to terminate in the right axilla  and was present previously. Multiple EKG lines overlie the patient and may obscure subtle underlying pathology.  IMPRESSION: Worsening interstitial and alveolar airspace opacities may reflect fluid overload, with increasing small to moderate right, small left pleural effusions.  Apparent interval extubation without change in remaining life support lines.   Electronically Signed   By: Awilda Metro   On: 07/28/2013 00:13   Dg Chest Port 1 View  07/27/2013   CLINICAL DATA:  Check endotracheal tube placement  EXAM: PORTABLE CHEST - 1 VIEW  COMPARISON:  07/26/2013  FINDINGS: Cardiac shadow is stable. A left central venous line is again noted in the mid superior vena cava. The endotracheal tube is noted 6.6 cm above the carinal. A nasogastric catheter is seen within the stomach. Stable changes are noted in the bases bilaterally. No new focal abnormality is seen.  IMPRESSION: No significant interval change from the prior exam.   Electronically Signed   By: Alcide Clever M.D.   On: 07/27/2013 08:09   Dg Chest Port 1 View  07/26/2013   CLINICAL DATA:  Followup pneumonia  EXAM: PORTABLE CHEST - 1 VIEW  COMPARISON:  07/25/2013  FINDINGS: A left-sided jugular central venous line is again noted in the proximal superior vena cava. The endotracheal tube and nasogastric catheter are stable in appearance and within normal position. The cardiac shadow is stable. Postsurgical changes are again noted. Increasing effusion is noted in the right base. The lungs are otherwise stable.  IMPRESSION: Increasing right basilar effusion.   Electronically Signed   By: Alcide Clever M.D.   On: 07/26/2013 07:21   Dg Chest Port 1 View  07/25/2013   CLINICAL DATA:  Respiratory failure and bowel resection.  EXAM: PORTABLE CHEST - 1 VIEW  COMPARISON:  07/24/2013  FINDINGS: Endotracheal tube tip approximately 4.5 cm above the carina. Central line positioning is stable. Lungs show improved aeration of both lower lobes. Underlying chronic  lung disease is stable.  IMPRESSION: Improved aeration of both lower lobes.   Electronically Signed   By: Irish Lack M.D.   On: 07/25/2013 07:00   Dg Chest Port 1 View  07/24/2013   CLINICAL DATA:  Endotracheal tube position. Decreased oxygen saturations.  EXAM: PORTABLE CHEST - 1 VIEW  COMPARISON:  07/23/2013  FINDINGS: Endotracheal tube is in place with tip likely just above the carina. Consider withdrawing endotracheal tube approximately 2 cm. Nasogastric tube is in place with tip off the film but beyond the lower esophagus. Left IJ central line tip overlies the level of the superior vena cava. Bilateral lower lobe infiltrates/effusions again noted.  IMPRESSION: 1. Endotracheal tube tip just above the carina. 2. Bilateral lower lobe opacities, similar in appearance to prior study. Findings are consistent with infiltrates and effusions.   Electronically Signed   By: Rosalie Gums M.D.   On: 07/24/2013 01:57   Dg Chest Port 1 View  07/23/2013   CLINICAL DATA:  Ventilated patient.  History of abdominal surgery.  EXAM: PORTABLE CHEST - 1 VIEW  COMPARISON:  07/22/2013  FINDINGS: Endotracheal tube is 4.9 cm above the carina. Jugular central venous catheter in the upper SVC region. Nasogastric tube extends into the abdomen. Increased  densities in the right lower chest could represent developing airspace disease or asymmetric edema. Slightly increased densities at the left lung base suggest atelectasis and suspect small pleural effusions. Heart size is stable. No evidence for a pneumothorax.  IMPRESSION: Increased densities in the right lower chest are concerning for developing airspace disease or asymmetric edema.  Probable small pleural effusions.   Electronically Signed   By: Richarda Overlie M.D.   On: 07/23/2013 08:08   Dg Chest Port 1 View  07/22/2013   CLINICAL DATA:  Status post laparotomy and small bowel resection for bowel infarction.  EXAM: PORTABLE CHEST - 1 VIEW  COMPARISON:  07/21/2013.  FINDINGS:  Endotracheal tube present with the tip approximately 5 cm above the carina. Central line tip lies in the upper SVC. Lungs show stable chronic disease without evidence of overt edema or airspace consolidation. The heart size is stable and within normal limits.  IMPRESSION: Stable chronic lung disease.   Electronically Signed   By: Irish Lack M.D.   On: 07/22/2013 10:41   Dg Chest Port 1 View  07/21/2013   CLINICAL DATA:  Endotracheal tube and line placement  EXAM: PORTABLE CHEST - 1 VIEW  COMPARISON:  07/18/2013  FINDINGS: Endotracheal tube tip lies 1.5 cm above the carina. A left internal jugular central venous line tip lies in the mid superior vena cava. A nasogastric tube has also been placed. Its tip lies at the GE junction. It will need to be further inserted to allow the tip to fully into the stomach.  No pneumothorax evident on this semi-erect study.  Lung hyperexpansion, chronic interstitial thickening and areas of lung scarring and probable basilar subsegmental atelectasis are stable. No area of focal consolidation and no overt pulmonary edema.  IMPRESSION: 1. Endotracheal tube and left internal jugular central venous line are well positioned. No pneumothorax. 2. Nasogastric tube tip lies at the GE junction. Recommend further inserting tube 10-15 cm to allow the tip to fully into the stomach. 3. No change in the appearance of the lungs when allowing for differences in technique and patient positioning.   Electronically Signed   By: Amie Portland M.D.   On: 07/21/2013 10:39   Dg Chest Port 1 View  07/18/2013   CLINICAL DATA:  Vomiting.  Smoker.  EXAM: PORTABLE CHEST - 1 VIEW  COMPARISON:  07/06/2013.  FINDINGS: Normal sized heart. Post CABG changes. Decreased inspiration with minimal bibasilar atelectasis. Atheromatous arterial calcifications. Stable right basilar scarring. Stable prominence of the interstitial markings.  IMPRESSION: 1. Poor inspiration with minimal bibasilar atelectasis. 2. Stable  changes of COPD with right basilar scarring.   Electronically Signed   By: Gordan Payment M.D.   On: 07/18/2013 14:28   Dg Abd Portable 1v  07/28/2013   CLINICAL DATA:  Nasogastric tube placement.  EXAM: PORTABLE ABDOMEN - 1 VIEW  COMPARISON:  07/21/2013 CT.  FINDINGS: Nasogastric tube tip distal esophagus level. This needs to be advanced.  Gas-filled slightly dilated small bowel loops. This represents a significant improvement from the prior CT. Patient has had interval surgery.  Basilar atelectasis.  Calcified aorta branch vessels.  Common iliac stents in place.  Residual barium in the rectum.  The possibility of free intraperitoneal air cannot be assessed on a supine view.  IMPRESSION: Nasogastric tube tip distal esophagus level. This needs to be advanced.  Please see above.  This is a call report.   Electronically Signed   By: Bridgett Larsson M.D.   On: 07/28/2013  14:25   Ct Angio Abd/pel W/ And/or W/o  07/21/2013   CLINICAL DATA:  Epigastric abdominal pain, nausea, vomiting and weight loss. Endoscopy on 12/07 demonstrates severe erosive ulcerative gastritis as well as duodenitis and jejunitis.  EXAM: CT ANGIOGRAPHY ABDOMEN AND PELVIS WITH CONTRAST AND WITHOUT CONTRAST  TECHNIQUE: Multidetector CT imaging of the abdomen and pelvis was performed using the standard protocol during bolus administration of intravenous contrast. Multiplanar reconstructed images including MIPs were obtained and reviewed to evaluate the vascular anatomy.  CONTRAST:  OMNIPAQUE IOHEXOL 350 MG/ML SOLN  COMPARISON:  Standard CT of the abdomen and pelvis with contrast on 07/18/2013.  FINDINGS: The aorta and major visceral branches are heavily calcified with diffuse atherosclerosis present. The origin of the celiac axis shows critical narrowing and likely near subtotal occlusive disease. Distal branches are opacified and the celiac trunk is likely not completely occluded.  The proximal superior mesenteric artery shows heavily  calcified plaque extending for a long distance down the trunk of the SMA. The proximal SMA trunk is completely occluded with reconstitution more distally by jejunal branches related to collateral reconstitution from celiac and IMA supplied. The inferior mesenteric artery origin is heavily calcified but open.  Bilateral renal artery stents are identified which are open. Bilateral common iliac artery stents are also present which are open. The external iliac arteries are diffusely diseased with heavily calcified plaque.  There is likely critical stenosis and near subtotal occlusion at the level of the mid right external iliac artery. Moderate narrowing of the distal external iliac artery is also present just above the inguinal ligament approaching 70-75% narrowing. At the level of the right groin, postsurgical changes are seen likely related to prior femoral bypass with an occluded proximal bypass graft present. The native SFA is also occluded at its origin.  Diffuse disease of the left external iliac artery present without significant stenosis. The common femoral artery shows irregular plaque causing approximately 50% narrowing just below the inguinal ligament. Postsurgical changes are seen in the left groin with an open proximal bypass graft identified in the anterior thigh. The native SFA is occluded at its origin.  Nonvascular evaluation shows significant interval distension of small bowel loops which are diffusely dilated and fluid-filled. There may be some pneumatosis in the transverse duodenum and proximal jejunum as well, concerning for potential evolution of bowel ischemia to necrosis. The colon is completely decompressed. There is no evidence of free intraperitoneal air or focal abscess. The gallbladder shows some increased distention since the prior study. There is no evidence of overt biliary obstruction, however. Stable left adrenal mass identified. This is fairly low density and likely an incidental  adenoma.  Review of the MIP images confirms the above findings.  IMPRESSION: 1. 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 2. 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 3. Heavily calcified but open inferior mesenteric artery. 4. 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 Postsurgical changes are evident in the right groin with occlusion of a previously placed proximal bypass graft. 5. Open left femoral bypass graft. 6. Significant interval distension of small bowel loops which are diffusely dilated and filled with fluid. There also may be a pneumatosis in the duodenum and proximal jejunum concerning for severe ischemia/necrosis. Surgical consultation would be recommended. Critical Value/emergent results were called by telephone at the time of interpretation on 07/21/2013  at 8:00 AM to Dr.Prashant Thedore Mins, who verbally acknowledged these results.   Electronically Signed   By: Irish Lack M.D.   On: 07/21/2013 08:35   Anti-infectives: Anti-infectives   Start     Dose/Rate Route Frequency Ordered Stop   07/25/13 1115  vancomycin (VANCOCIN) IVPB 1000 mg/200 mL premix     1,000 mg 200 mL/hr over 60 Minutes Intravenous To Surgery 07/25/13 1108 07/25/13 1111   07/22/13 2200  vancomycin (VANCOCIN) IVPB 1000 mg/200 mL premix     1,000 mg 200 mL/hr over 60 Minutes Intravenous Every 12 hours 07/22/13 2056     07/21/13 1000  fluconazole (DIFLUCAN) IVPB 200 mg     200 mg 100 mL/hr over 60 Minutes Intravenous Every 24 hours 07/21/13 0944     07/21/13 0945  metroNIDAZOLE (FLAGYL) IVPB 500 mg  Status:  Discontinued     500 mg 100 mL/hr over 60 Minutes Intravenous Every 8 hours 07/21/13 0933 07/25/13 0918   07/21/13 0945  piperacillin-tazobactam (ZOSYN) IVPB 3.375 g     3.375 g 12.5 mL/hr over 240 Minutes Intravenous 3 times  per day 07/21/13 0944     07/21/13 0800  vancomycin (VANCOCIN) IVPB 1000 mg/200 mL premix  Status:  Discontinued     1,000 mg 200 mL/hr over 60 Minutes Intravenous Every 12 hours 07/21/13 0626 07/22/13 1045   07/21/13 0615  ceFEPIme (MAXIPIME) 1 g in dextrose 5 % 50 mL IVPB  Status:  Discontinued     1 g 100 mL/hr over 30 Minutes Intravenous 3 times per day 07/21/13 1610 07/21/13 0919      Assessment/Plan: s/p Procedure(s): THROMBECTOMY OF  FEMORAL ARTERY (Right) Had a long discussion with the patient and his wife present. I explained that his only option would be above-knee amputation. With some blistering over his calf and tenderness in his calf I do not feel he has adequate flow for below knee amputation. The patient wishes to proceed as soon as possible due to the pain. I did explain that he would have to be back on the respirator and would probably be re\re weaned again. I did speak with Dr. Craige Cotta with critical care this morning who agrees with this plan. We will keep him extubated postop with re\re weaned once he is back in surgical intensive.. I spoke with Dr. Myra Gianotti who will be doing the surgery. The patient and wife understand that Dr. Myra Gianotti will be doing the surgery.  LOS: 11 days   Tyshawna Alarid 07/29/2013, 8:17 AM

## 2013-07-29 NOTE — Op Note (Signed)
    Patient name: [name] MRN: [id number] DOB: January 27, 1935 Sex: male  07/18/2013 - 07/29/2013 Pre-operative Diagnosis: Ischemic right leg Post-operative diagnosis:  Same Surgeon:  Jorge Ny Assistants:  Emerson Monte Procedure:   Right above-knee amputation Anesthesia:  Gen. Blood Loss:  See anesthesia record Specimens:  Right leg  Findings:  Viable muscle within the thigh.  There was a fair amount of edema fluid laterally.  Indications:  The patient has developed progressive ischemia of the right foot which is no longer salvageable.  He has significant tenderness within his right calf which suggest the ischemia has progressed to this level.  For that reason it has been recommended that he proceed with a right above-knee amputation.  The patient and family have consented to the procedure.  Procedure:  The patient was identified in the holding area and taken to [facility] OR ROOM 12  The patient was then placed supine on the table. general anesthesia was administered.  The patient was prepped and draped in the usual sterile fashion.  A time out was called and antibiotics were administered.  A fishmouth incision was made just proximal to the patella.  Bovie cautery was used to divide the subcutaneous tissue down to the fascia which was also divided with Bovie cautery.  The muscle was then divided with cautery down to where the femur was exposed.  I circumferentially exposed the femur.  A periosteal elevator was used to elevate the periosteum.  A Gigli saw was then used to transect the femur, beveling the anterior one third.  The remaining muscular attachments were divided with cautery.  I isolated the neurovascular bundle.  The artery and vein were ligated with a 0 silk tie, just proximal to the cut edge of the femur.  I similarly ligated the Gore-Tex bypass graft.  The nerve was individually ligated and cut proximal to the beveled edge of the femur.  The leg was then removed as a specimen.  The  stump was then copiously irrigated.  Hemostasis was achieved with Bovie cautery.  Next, the fascia was reapproximated with interrupted 20 figure-of-eight Vicryl suture.  The skin edges were closed with staples.  Sterile dressings were applied.  There were no immediate complications.   Disposition:  To PACU in stable condition.   Juleen China, M.D. [facility] Office: [phone] Pager:  [phone]

## 2013-07-29 NOTE — Progress Notes (Signed)
ANTIBIOTIC CONSULT NOTE - FOLLOW UP  Pharmacy Consult for Vanco/Zosyn/Fluconazole Indication: pneumonia, Bowel necrosis  No Known Allergies  Labs:  Recent Labs  07/27/13 0400 07/28/13 0405 07/28/13 1740 07/29/13 0400  WBC 9.9 12.7*  --  13.0*  HGB 9.5* 9.8*  --  9.2*  PLT 34* 41*  --  46*  CREATININE 0.73 0.73 0.75 0.73   Estimated Creatinine Clearance: 86.2 ml/min (by C-G formula based on Cr of 0.73).  Recent Labs  07/29/13 0930  VANCOTROUGH 22.4*     Microbiology: Recent Results (from the past 720 hour(s))  MRSA PCR SCREENING     Status: None   Collection Time    07/06/13 10:25 PM      Result Value Range Status   MRSA by PCR NEGATIVE  NEGATIVE Final   Comment:            The GeneXpert MRSA Assay (FDA     approved for NASAL specimens     only), is one component of a     comprehensive MRSA colonization     surveillance program. It is not     intended to diagnose MRSA     infection nor to guide or     monitor treatment for     MRSA infections.  CLOSTRIDIUM DIFFICILE BY PCR     Status: None   Collection Time    07/08/13  4:55 PM      Result Value Range Status   C difficile by pcr NEGATIVE  NEGATIVE Final  STOOL CULTURE     Status: None   Collection Time    07/08/13  4:55 PM      Result Value Range Status   Specimen Description STOOL   Final   Special Requests Normal   Final   Culture     Final   Value: NO SALMONELLA, SHIGELLA, CAMPYLOBACTER, YERSINIA, OR E.COLI 0157:H7 ISOLATED     Performed at [facility]   Report Status 07/12/2013 FINAL   Final  CULTURE, BLOOD (ROUTINE X 2)     Status: None   Collection Time    07/21/13  7:45 AM      Result Value Range Status   Specimen Description BLOOD LEFT ARM   Final   Special Requests BOTTLES DRAWN AEROBIC ONLY 10CC   Final   Culture  Setup Time     Final   Value: 07/21/2013 13:39     Performed at [facility]   Culture     Final   Value: NO GROWTH 5 DAYS     Performed at [facility]   Report Status 07/27/2013 FINAL   Final  CULTURE, BLOOD (ROUTINE X 2)     Status: None   Collection Time    07/21/13  7:50 AM      Result Value Range Status   Specimen Description BLOOD LEFT ARM   Final   Special Requests BOTTLES DRAWN AEROBIC ONLY [facility]   Final   Culture  Setup Time     Final   Value: 07/21/2013 13:39     Performed at [facility]   Culture     Final   Value: NO GROWTH 5 DAYS     Performed at [facility]   Report Status 07/27/2013 FINAL   Final  CULTURE, RESPIRATORY (NON-EXPECTORATED)     Status: None   Collection Time    07/21/13  3:10 PM      Result Value Range Status  Specimen Description ENDOTRACHEAL   Final   Special Requests NONE   Final   Gram Stain     Final   Value: FEW WBC PRESENT,BOTH PMN AND MONONUCLEAR     NO SQUAMOUS EPITHELIAL CELLS SEEN     FEW GRAM POSITIVE RODS     RARE GRAM NEGATIVE RODS     RARE GRAM POSITIVE COCCI   Culture     Final   Value: MODERATE ESCHERICHIA COLI     Performed at [facility]   Report Status 07/24/2013 FINAL   Final   Organism ID, Bacteria ESCHERICHIA COLI   Final    Anti-infectives   Start     Dose/Rate Route Frequency Ordered Stop   07/29/13 2200  vancomycin (VANCOCIN) IVPB 750 mg/150 ml premix     750 mg 150 mL/hr over 60 Minutes Intravenous Every 12 hours 07/29/13 1014     07/25/13 1115  vancomycin (VANCOCIN) IVPB 1000 mg/200 mL premix     1,000 mg 200 mL/hr over 60 Minutes Intravenous To Surgery 07/25/13 1108 07/25/13 1111   07/22/13 2200  vancomycin (VANCOCIN) IVPB 1000 mg/200 mL premix  Status:  Discontinued     1,000 mg 200 mL/hr over 60 Minutes Intravenous Every 12 hours 07/22/13 2056 07/29/13 1014   07/21/13 1000  fluconazole (DIFLUCAN) IVPB 200 mg     200 mg 100 mL/hr over 60 Minutes Intravenous Every 24 hours 07/21/13 0944     07/21/13 0945  metroNIDAZOLE (FLAGYL) IVPB 500 mg  Status:  Discontinued     500 mg 100 mL/hr over 60 Minutes Intravenous Every 8 hours  07/21/13 0933 07/25/13 0918   07/21/13 0945  piperacillin-tazobactam (ZOSYN) IVPB 3.375 g     3.375 g 12.5 mL/hr over 240 Minutes Intravenous 3 times per day 07/21/13 0944     07/21/13 0800  vancomycin (VANCOCIN) IVPB 1000 mg/200 mL premix  Status:  Discontinued     1,000 mg 200 mL/hr over 60 Minutes Intravenous Every 12 hours 07/21/13 0626 07/22/13 1045   07/21/13 0615  ceFEPIme (MAXIPIME) 1 g in dextrose 5 % 50 mL IVPB  Status:  Discontinued     1 g 100 mL/hr over 30 Minutes Intravenous 3 times per day 07/21/13 6213 07/21/13 0919      Assessment: 78 y/o male admitted w/Atrial fibrillation, N/V, Dehydration, Hypokalemia, abd pain, Enteritis due to Norovirus  Anticoagulation: none, has SCDs. Pt has been off due to several GIBs.   Infectious Disease: Strep Pneumo PNA & Ecoli (aspiration) on Vanc/Zosyn/Flucon - suspected PNA and extensive bowel ischemia - at risk for perforation. Afebrile, WBC 12.7 up, CrCl 86  12/8 > Vanc>>  12/9 VT = 18 on 1g q12 12/16 VT = 22.4 on 1 g iv Q 12  12/8 > Zosyn>> 12/8 > Fluconazole>> 12/6 > Metronidazole>> 12/12 12/6 > Cefepime >>12/8  12/8 + strep pneumo urinary antigen 12/8 BC x 2 >> Hegative 12/8 Resp>> Ecoli (Sens to CTX and Zosyn)  Cardiovascular: ICM, severe PAD. EF 35%. New NSTEMIVSS. Trop+ x4. Meds: Amio po, IV Lasix, (No ACEI due to h/o RAS) --12/12 s/p R LE endarterectomy (Dr Early) for ischemic RLE. Pt now considering R AKA vs BKA today 12/16  Endocrinology - No DM - CBG's <150 on SSI, TG 210.   Gastrointestinal / Nutrition - severe erosive and ulcerative gastritis/duodenitis/jejunitis - IV PPI q12. He has extensive bowel necrosis (s/p resection/anastomosis/abdominal wall closure), +TPN added. LFTs now trending down slowly (holding lipitor)(but still on amiodarone) --12/8 Exploratory  laparotomy with entrectomy (majority of jejunum and proximal ileum)  --12/9 Segmental small bowel resection terminal ileum Two anastomoses to re-establish  intestinal continuity  Neurology -  Nephrology - sCr 0.73 - stable, lytes ok but K runs and Mg2g ordered  Pulmonary - COPD with h/o tobacco. Xopenex nebs,   Hematology / Oncology -thrombocytopenia, Plts 41 - no heparin on board- holding plavix. FFP given on 12/9. INR 1.6. DIC panel prelim: abnormal.  Best Practices SCDs    Goal of Therapy:  Vancomycin trough level 15-20 mcg/ml  Plan:  -Hold 1 dose of vancomycin for slightly elevated trough, then decrease to 750 Q 12 -Zosyn 3.375gm IV q8hr -Fluconazole 200mg  q24  Thank you. Okey Regal, PharmD [phone]  07/29/2013,10:19 AM

## 2013-07-29 NOTE — Interval H&P Note (Signed)
History and Physical Interval Note:  07/29/2013 10:38 AM  [name]  has presented today for surgery, with the diagnosis of Dead Leg  The various methods of treatment have been discussed with the patient and family. After consideration of risks, benefits and other options for treatment, the patient has consented to  Procedure(s): AMPUTATION ABOVE KNEE (Right) as a surgical intervention .  The patient's history has been reviewed, patient examined, no change in status, stable for surgery.  I have reviewed the patient's chart and labs.  Questions were answered to the patient's satisfaction.    I have examined the patient.  He clearly has a non-viable right leg with demarcation above the ankle.  Also, he has calf tenderness which probably indicated ischemic muscle.  I do not think he will heal a BKA and therefore, I agree with proceeding with an above knee amputation.  All of the patient's questions and family's were answered.  [name], V. WELLS

## 2013-07-29 NOTE — Progress Notes (Signed)
PULMONARY  / CRITICAL CARE MEDICINE  Name: [name] MRN: [id number] DOB: 05-07-1935    ADMISSION DATE:  07/18/2013 CONSULTATION DATE: 12-8  REFERRING MD :  [facility] PRIMARY SERVICE: PCCM  CHIEF COMPLAINT:  ARF  BRIEF PATIENT DESCRIPTION:  78 yo male with ischemic cardiomyopathy, severe PAD admitted with abdominal pain and CT abd demonstrates extensive ischemia changes, small bowel distension and was found to be in shock and respiratory failure and was urgently intubated and transferred to ICU.   SIGNIFICANT EVENTS / STUDIES:  12-8 intubated, shock, surgical evaluation 12-8 DNR established  12/8 Exploratory laparotomy with entrectomy (majority of jejunum and proximal ileum) 12/9 Segmental small bowel resection terminal ileum  Two anastomoses to re-establish intestinal continuity 12/11 requires higher PEEP/FIO2 12/12 s/p R LE endarterectomy (Dr Early)  12/16 To OR for Rt AKA   LINES / TUBES: 12- 8 OTT>>12/14 12-8 Rt fem cvl>>12-8 12-8 Lt IJ cvl>> R axillary aline 12/8 >>   CULTURES: 12-8 bc x 2>>ng 12-8 sputum>>e coli S to ceftx / zosyn  ANTIBIOTICS: 12-8 vanc>> 12-8 diflucan>> 12-8 pip tazo>>  SUBJECTIVE:  Still has cough with congestion.  C/o pain in Rt leg.  VITAL SIGNS: Temp:  [97.2 F (36.2 C)-97.9 F (36.6 C)] 97.3 F (36.3 C) (12/16 0816) Pulse Rate:  [71-92] 86 (12/16 0800) Resp:  [14-27] 24 (12/16 0800) BP: (98-134)/(34-79) 117/46 mmHg (12/16 0800) SpO2:  [86 %-99 %] 94 % (12/16 0800) Arterial Line BP: (113-138)/(37-83) 124/38 mmHg (12/16 0600) FiO2 (%):  [35 %-100 %] 100 % (12/16 0800) Weight:  [181 lb (82.1 kg)] 181 lb (82.1 kg) (12/16 0500) HEMODYNAMICS: CVP:  [4 mmHg-9 mmHg] 7 mmHg VENTILATOR SETTINGS: Vent Mode:  [-]  FiO2 (%):  [35 %-100 %] 100 % INTAKE / OUTPUT: Intake/Output     12/15 0701 - 12/16 0700 12/16 0701 - 12/17 0700   I.V. (mL/kg) 633.3 (7.7) 40 (0.5)   NG/GT 90    IV Piggyback 1062.5    TPN 2087.6 186   Total Intake(mL/kg)  3873.4 (47.2) 226 (2.8)   Urine (mL/kg/hr) 6580 (3.3) 75 (0.5)   Emesis/NG output 250 (0.1)    Total Output 6830 75   Net -2956.6 +151        Stool Occurrence 2 x      PHYSICAL EXAMINATION: General:  Elderly male, pleasant, in no distress   Neuro:   Awake, alert, appropriate, weak  HEENT: Edentulous, No JVD/LAN, NRB  Cardiovascular:  s1s2 rrr Lungs:  resps even non labored on NRB, coarse R>L, few scattered bibasilar crackles  Abdomen:  Tense/Tender, OGT with bilious drainage, midline incision  Musculoskeletal:  Wasted musculature Skin:  Rt lower ext  Cool below knee, clear demarcation at proximal foot just below ankle   LABS:  CBC  Recent Labs Lab 07/27/13 0400 07/28/13 0405 07/29/13 0400  WBC 9.9 12.7* 13.0*  HGB 9.5* 9.8* 9.2*  HCT 28.5* 29.7* 28.0*  PLT 34* 41* 46*   Coag's  Recent Labs Lab 07/25/13 0431 07/25/13 1100 07/26/13 0430  APTT 44* 42* 45*  INR 1.59* 1.58* 1.53*   BMET  Recent Labs Lab 07/28/13 0405 07/28/13 1740 07/29/13 0400  NA 135 138 136  K 3.6 3.2* 3.4*  CL 104 103 102  CO2 25 27 29   BUN 19 19 20   CREATININE 0.73 0.75 0.73  GLUCOSE 148* 161* 131*   Electrolytes  Recent Labs Lab 07/26/13 0430 07/27/13 0400 07/28/13 0405 07/28/13 1740 07/29/13 0400  CALCIUM 7.1* 7.0* 7.4* 7.3* 7.4*  MG 1.9 1.7 1.7  --  1.7  PHOS 2.2* 2.7 2.7  --   --    Sepsis Markers  Recent Labs Lab 07/24/13 0900 07/25/13 1000  LATICACIDVEN 1.5 1.1   ABG  Recent Labs Lab 07/24/13 0905 07/26/13 0313 07/27/13 2341  PHART 7.383 7.421 7.353  PCO2ART 42.0 38.3 42.8  PO2ART 169.0* 96.6 61.0*   Liver Enzymes  Recent Labs Lab 07/25/13 0330 07/26/13 0430 07/27/13 0400  AST 277* 130* 106*  ALT 694* 393* 237*  ALKPHOS 178* 168* 168*  BILITOT 3.5* 2.8* 2.6*  ALBUMIN 1.6* 1.5* 1.3*   Cardiac Enzymes  Recent Labs Lab 07/24/13 1800 07/25/13 0330 07/26/13 0430  TROPONINI 1.54* 1.22* 0.74*   Glucose  Recent Labs Lab 07/28/13 1148  07/28/13 1607 07/28/13 2007 07/29/13 0008 07/29/13 0422 07/29/13 0814  GLUCAP 144* 127* 170* 136* 127* 127*    Imaging Dg Chest Portable 1 View  07/29/2013   CLINICAL DATA:  Respiratory distress.  EXAM: PORTABLE CHEST - 1 VIEW  COMPARISON:  07/27/2013  FINDINGS: There is hyperinflation of the lungs compatible with COPD. Prior CABG. Left central line and NG tube are unchanged. Mild cardiomegaly. Bilateral airspace opacities and interstitial disease again noted. Small bilateral effusions are stable. No significant change since prior study.  IMPRESSION: COPD.  Stable bilateral interstitial and alveolar opacities, likely edema.  Stable small bilateral effusions.   Electronically Signed   By: Charlett Nose M.D.   On: 07/29/2013 08:15   Dg Chest Port 1 View  07/28/2013   CLINICAL DATA:  Respiratory distress, shortness of breath.  EXAM: PORTABLE CHEST - 1 VIEW  COMPARISON:  Chest radiograph July 27, 2013 at 6:24 a.m.  FINDINGS: Cardiac silhouette is scratch the upper limits of normal, status post median sternotomy for apparent Coronary artery bypass grafting. Increasing central pulmonary vasculature congestion, worsening interstitial prominence with new right lower lobe airspace opacity. Small to moderate right pleural effusion extending into the fissure. Small left pleural effusion.  Interval apparent extubation. Left internal jugular central venous catheter with distal tip projecting in proximal superior vena cava. Nasogastric tube in place with side port projecting at gastroesophageal junction. Right upper extremity catheter may reflect a PICC, distal tip appears to terminate in the right axilla and was present previously. Multiple EKG lines overlie the patient and may obscure subtle underlying pathology.  IMPRESSION: Worsening interstitial and alveolar airspace opacities may reflect fluid overload, with increasing small to moderate right, small left pleural effusions.  Apparent interval extubation  without change in remaining life support lines.   Electronically Signed   By: Awilda Metro   On: 07/28/2013 00:13   Dg Abd Portable 1v  07/28/2013   CLINICAL DATA:  Nasogastric tube placement.  EXAM: PORTABLE ABDOMEN - 1 VIEW  COMPARISON:  07/21/2013 CT.  FINDINGS: Nasogastric tube tip distal esophagus level. This needs to be advanced.  Gas-filled slightly dilated small bowel loops. This represents a significant improvement from the prior CT. Patient has had interval surgery.  Basilar atelectasis.  Calcified aorta branch vessels.  Common iliac stents in place.  Residual barium in the rectum.  The possibility of free intraperitoneal air cannot be assessed on a supine view.  IMPRESSION: Nasogastric tube tip distal esophagus level. This needs to be advanced.  Please see above.  This is a call report.   Electronically Signed   By: Bridgett Larsson M.D.   On: 07/28/2013 14:25    ASSESSMENT / PLAN:  PULMONARY A: Acute resp failure -  worsening hypoxia/ALI 12/11 G neg- aspn pneumonia COPD with tobacco abuse hx Resp + metab alkalosis P:   -schedule BD's -f/u CXR -will likely need vent post-op  CARDIOVASCULAR A:  Shock presumed sepsis, SIRS response - improved CAD/ isch cardiomyopathy -EF 35% Vasculopathy -ischemic RLE > s/p endarterectomy 12/12 NSTEMI Volume overload/ pulm edema  A fib P:  -For Rt AKA 12/16 -Even to negative fluid balance as tolerated -continue amiodarone >> likely transition to IV after surgery  RENAL A:   Metabolic acidosis >> resolved. Hypokalemia P:   -f/u renal fx, urine outpt -replace electrolytes as needed  GASTROINTESTINAL A:   SBO/Ischemia s/p extensive SB resection Shock liver -LFTs improving H/o antral ulcers P:   -CCS following -continue TNA   HEMATOLOGIC A:   Thrombocytopenia Coagulopathy-  With low fibrinogen, low grade DIC picture P:   -scds  -Follow CBC -defer PLT transfusion to VVS in anticipation of surgery 12/16  INFECTIOUS A:    E coli PNA. Peritonitis. P:   -continue vancomycin, zoysn, diflucan for now  ENDOCRINE A:   Hyperglycemia P:   -SSI  NEUROLOGIC A:  Pain control. P:   fent for pain   TODAY'S SUMMARY:  For AKA 12/16.  Pt aware he will likely need to remain on vent post-op.  CC time 35 minutes.  Updated family at bedside.    Coralyn Helling, MD [facility] Pulmonary/Critical Care 07/29/2013, 8:55 AM Pager:  [phone] After 3pm call: [phone]

## 2013-07-29 NOTE — Anesthesia Postprocedure Evaluation (Signed)
  Anesthesia Post-op Note  Patient: GEROD CALIGIURI  Procedure(s) Performed: Procedure(s): AMPUTATION ABOVE KNEE (Right)  Patient Location: SICU  Anesthesia Type:General  Level of Consciousness: sedated, responds to stimulation and Patient remains intubated per anesthesia plan  Airway and Oxygen Therapy: Patient remains intubated per anesthesia plan and Patient placed on Ventilator (see vital sign flow sheet for setting)  Post-op Pain: none  Post-op Assessment: Patient's Cardiovascular Status Stable, Respiratory Function Stable, Patent Airway and No signs of Nausea or vomiting  Post-op Vital Signs: Reviewed and stable  Complications: No apparent anesthesia complications

## 2013-07-29 NOTE — Progress Notes (Signed)
Weaned fio2 to 60 % in effort to wean, and per PO2 results on 1700 ABG.  Sats 97-98%

## 2013-07-29 NOTE — Progress Notes (Signed)
Pt sats at 82-84% upon returned from OR on 100% ambu bagging.  Placed pt on vent (per prev. Vent settings) and 100% fio2.  Sats 97% now.  RN aware.  Awaiting vent orders from CCM.

## 2013-07-29 NOTE — Progress Notes (Signed)
Fentanyl PCA 33ml flushed down sink. Witnessed by Darnelle Going, RN. Disconnected PCA as patient was going to OR.

## 2013-07-29 NOTE — Progress Notes (Signed)
PARENTERAL NUTRITION CONSULT NOTE  Pharmacy Consult for TPN Indication: massive bowel resection  No Known Allergies  Patient Measurements: Height: 6' (182.9 cm) Weight: 181 lb (82.1 kg) IBW/kg (Calculated) : 77.6  Vital Signs: Temp: 97.9 F (36.6 C) (12/16 0400) Temp src: Oral (12/16 0400) BP: 107/42 mmHg (12/16 0600) Pulse Rate: 77 (12/16 0600) Intake/Output from previous day: 12/15 0701 - 12/16 0700 In: 3823.4 [I.V.:633.3; NG/GT:90; IV Piggyback:1012.5; TPN:2087.6] Out: 3295 [JOACZ:6606; Emesis/NG output:250] Intake/Output from this shift:    Labs:  Recent Labs  07/27/13 0400 07/28/13 0405 07/29/13 0400  WBC 9.9 12.7* 13.0*  HGB 9.5* 9.8* 9.2*  HCT 28.5* 29.7* 28.0*  PLT 34* 41* 46*     Recent Labs  07/27/13 0400 07/28/13 0405 07/28/13 1740 07/29/13 0400  NA 138 135 138 136  K 3.5 3.6 3.2* 3.4*  CL 108 104 103 102  CO2 23 25 27 29   GLUCOSE 124* 148* 161* 131*  BUN 20 19 19 20   CREATININE 0.73 0.73 0.75 0.73  CALCIUM 7.0* 7.4* 7.3* 7.4*  MG 1.7 1.7  --  1.7  PHOS 2.7 2.7  --   --   PROT 3.6*  --   --   --   ALBUMIN 1.3*  --   --   --   AST 106*  --   --   --   ALT 237*  --   --   --   ALKPHOS 168*  --   --   --   BILITOT 2.6*  --   --   --   PREALBUMIN  --  6.4*  --   --   TRIG  --  140  --   --    Estimated Creatinine Clearance: 86.2 ml/min (by C-G formula based on Cr of 0.73).    Recent Labs  07/28/13 2007 07/29/13 0008 07/29/13 0422  GLUCAP 170* 136* 127*    Insulin Requirements in the past 24 hours:  4 units SSI  Current Nutrition:  NPO  Clinimix E 5/15 at 83 ml/hr and lipid 20% at 10 ml/hr provides 1654 kcal and 99.6 grams of protein * New goal rate of Clinimix E 5/15 at 100 ml/hr and 20% lipids at 10 ml/hr provides 2184 kcal and 120 grams protein per day.    Nutritional Goals:  2000-2200 kCal, 110-120 grams of protein per day per RD recommendations 12/15  Admit: [name] admitted with N/V, dehydration, abdominal pain and  enteritis d/t norovirus. Found to have mesenteric ischemia and infarcted small bowel. Underwent small bowel resection-majority of jejunum and proximal ileum- currently has minimal amount of small bowel needed to survive. Patient is DNR but continuing full medical care. May require long term TPN.  GI: 12/7 EGD showed changes suggestive of severe erosive ulcerative gastritis, duodenitis and jujinitis - 12/7 CT angiogram suggested diffuse calcifications of mesenteric circulation; 12/8 massive bowel resection - majority of jejunum and proximal ileum. 12/9 resection of terminal ileuim. Two anastomoses to re-establish intestinal continuity. Pt pulled out NG tube on 12/15 -- replaced. Last BM 12/5, minimal bowel sounds. TPN started 12/10 for nutritional support. NGO 450 cc/24h  Endo: No hx. CBGs/12h: 127-170. On sensitive SSI Lytes: K 3.4 (KCl 10 mEq IV x 2 replaced per MD), Mg 1.7 (Mg 2g ordered by the MD this a.m), Na 136, Phos 2.7 (12/15), CoCa~9.6 Renal: SCr 0.73, CrCl~80-90 ml/min. UOP 3.5 ml/kg/hr. I/O 3707/8130 = -4.4 L s/p 3 doses of lasix on 12/15. Pulm: Hx COPD/tobacco abuse. Extubated 12/14.  94% on non-rebreather at fiO2 100%. -- will be re-intubated for surgery on 12/16 Cards: Hx CAD(CABG '95), PCI '10)/DL/HTN/PVD/Aflutter. VVS on board for R-AKA planned for 12/16 d/t severe PVD. S/p shock -- pressors now weaned off. BP soft, HR wnl (NSR/BBB). On amiodarone, intermittent lasix Hepatobil: Shocked liver - resolving. AST/ALT 107/237 << 130/393, Alk Phos 168 (stable), TBili 2.6 << 2.8. TG 140 (12/15) << 210 (12/11), pre-albumin 6.4 (12/15) << <3 (12/11), alb 1.3 (12/14) Neuro: Hx CVA/depression. GCS 15 - no sedation.  Heme: aPTT 45 seconds, PT 18, INR 1.53- improving. Patient has received vitamin K and FFP as reversal before surgeries. Hgb 9.2, plts 46. Does have ulcers and anticoagulation has been held in the past d/t GI bleed. Has low fibrinogen and high D-Dimer.  ID:  Vanc + Zosyn + Fluconazole for  high risk of perforation, r/o bacterial translocation. Afebrile, WBC 13 << 12.7 Best Practices: MC, SCDs, PPI IV TPN Access: LIJ- triple lumen (placed 12/8) TPN day#: 6  (12/10 >> current)  Plan - Increase Clinimix E 5/15 to 100 ml/hr to achieve new RD goal recommendations - Add MV and TE in TPN bag - Continue 20% lipids at 10 ml/hr - Continue sensitive SSI - Will f/u TPN labs  Georgina Pillion, PharmD, BCPS Clinical Pharmacist Pager: [phone] 07/29/2013 7:19 AM

## 2013-07-29 NOTE — Progress Notes (Addendum)
Subjective: Interval History: none.. the patient reports significant pain in his right foot. He is alert and extubated on 100% nonrebreather  Objective: Vital signs in last 24 hours: Temp:  [97.2 F (36.2 C)-97.9 F (36.6 C)] 97.3 F (36.3 C) (12/16 0816) Pulse Rate:  [71-92] 77 (12/16 0600) Resp:  [14-27] 27 (12/16 0600) BP: (98-134)/(34-79) 107/42 mmHg (12/16 0600) SpO2:  [86 %-99 %] 98 % (12/16 0736) Arterial Line BP: (113-138)/(37-83) 124/38 mmHg (12/16 0600) FiO2 (%):  [35 %-100 %] 100 % (12/16 0507) Weight:  [181 lb (82.1 kg)] 181 lb (82.1 kg) (12/16 0500)  Intake/Output from previous day: 12/15 0701 - 12/16 0700 In: 3823.4 [I.V.:633.3; NG/GT:90; IV Piggyback:1012.5; TPN:2087.6] Out: 6830 [Urine:6580; Emesis/NG output:250] Intake/Output this shift:    Abdomen soft nontender. Right foot clearly not viable with bulla present. He is tender up onto his calf. The thigh is nontender he has normal skin coloration. Does have a palpable right femoral pulse.  Lab Results:  Recent Labs  07/28/13 0405 07/29/13 0400  WBC 12.7* 13.0*  HGB 9.8* 9.2*  HCT 29.7* 28.0*  PLT 41* 46*   BMET  Recent Labs  07/28/13 1740 07/29/13 0400  NA 138 136  K 3.2* 3.4*  CL 103 102  CO2 27 29  GLUCOSE 161* 131*  BUN 19 20  CREATININE 0.75 0.73  CALCIUM 7.3* 7.4*    Studies/Results: Dg Chest 2 View  07/06/2013   CLINICAL DATA:  Epigastric pain for 1 week, low blood pressure  EXAM: CHEST  2 VIEW  COMPARISON:  06/25/2013  FINDINGS: Heart size mildly enlarged. Heavy aortic arch calcification. Vascular pattern normal. Mild diffuse interstitial change most prominent in the right middle lobe and left base. Unchanged appearance when compared to prior study, except for mild decrease in the overall prominence of interstitial change. Right internal jugular catheter has been removed.  IMPRESSION: Decreased interstitial conspicuity suggesting resolving interstitial pulmonary edema.   Electronically  Signed   By: Raymond  Rubner M.D.   On: 07/06/2013 18:50   Dg Abd 1 View  07/07/2013   CLINICAL DATA:  Increased abdominal pain, evaluate for ileus  EXAM: ABDOMEN - 1 VIEW  COMPARISON:  06/17/2013  FINDINGS: Mild nonspecific gas distention of the bowel. Negative for significant obstruction or ileus. Extensive aortoiliac calcifications noted. Bilateral renal and common iliac stents present. No acute osseous finding.  IMPRESSION: Negative for significant obstruction or ileus.   Electronically Signed   By: Trevor  Shick M.D.   On: 07/07/2013 08:19   Us Abdomen Complete  07/08/2013   CLINICAL DATA:  Nausea.  Abdominal pain.  EXAM: ULTRASOUND ABDOMEN COMPLETE  COMPARISON:  None.  FINDINGS: Gallbladder  Several small less than 1 cm gallstones noted as well as minimal amount of sludge. No evidence of gallbladder wall thickening. No sonographic Murphy sign noted.  Common bile duct  Diameter: 4 mm  Liver  No focal lesion identified. Within normal limits in parenchymal echogenicity.  IVC  No abnormality visualized.  Pancreas  Visualized portion unremarkable.  Spleen  Size and appearance within normal limits.  Right Kidney  Length: 11.0 cm. Echogenicity within normal limits. No mass or hydronephrosis visualized.  Left Kidney  Length: 12.4 cm. Echogenicity within normal limits. No mass or hydronephrosis visualized.  Abdominal aorta  No aneurysm visualized.  IMPRESSION: Cholelithiasis. No sonographic signs of cholecystitis, biliary dilatation, or other acute findings.   Electronically Signed   By: John  Stahl M.D.   On: 07/08/2013 20:26   Ct Abdomen Pelvis   W Contrast  07/18/2013   CLINICAL DATA:  Vomiting. Recently discharged after being admitted for peptic ulcers.  EXAM: CT ABDOMEN AND PELVIS WITH CONTRAST  TECHNIQUE: Multidetector CT imaging of the abdomen and pelvis was performed using the standard protocol following bolus administration of intravenous contrast.  CONTRAST:  100mL OMNIPAQUE IOHEXOL 300 MG/ML  SOLN   COMPARISON:  06/17/2013.  FINDINGS: Multiple sigmoid colon diverticula are again demonstrated without evidence of diverticulitis. Multiple normal caliber fluid-filled loops of small bowel. Diffuse low density wall thickening in the duodenal bulb, measuring 1.2 cm in thickness on image number 26. This is producing moderate luminal narrowing. Tiny gallstones in the gallbladder. The largest measures 4 mm in maximum diameter. No gallbladder wall thickening or pericholecystic fluid.  Inhomogeneous enhancement of the liver and spleen with no discrete liver mass seen. 2.6 x 2.1 cm left adrenal mass with low density components. This was previously shown to measure 5 Hounsfield units without intravenous contrast, compatible with a benign adenoma. Small left renal cysts. Unremarkable right kidney, urinary bladder and prostate gland. No enlarged lymph nodes. No evidence of appendicitis. Dense atheromatous arterial calcifications. Bilateral renal artery stents. The no significant change in mild bibasilar atelectasis/scarring. Mild lumbar lower thoracic spine degenerative changes.  IMPRESSION: 1. Do tonight is involving the duodenal bulb with moderate luminal narrowing. 2. Normal caliber fluid-filled small bowel loops. This can be seen with gastroenteritis. 3. Cholelithiasis without evidence of cholecystitis. 4. Extensive arterial calcifications with bilateral renal artery stents. 5. Sigmoid diverticulosis. 6. Stable left adrenal adenoma.   Electronically Signed   By: Steve  Reid M.D.   On: 07/18/2013 18:27   Us Renal  07/07/2013   CLINICAL DATA:  Renal failure  EXAM: RENAL/URINARY TRACT ULTRASOUND COMPLETE  COMPARISON:  CT abdomen and pelvis 06/17/2013  FINDINGS: Right Kidney  Length: 10.3 cm. Normal cortical thickness and echogenicity for age. . 3 mm echogenic non shadowing focus mid right kidney corresponding the calculus on prior CT. No mass or hydronephrosis.  Left Kidney  Length: 11.5 cm. Normal cortical thickness and  echogenicity for age. No mass, hydronephrosis or shadowing calcification.  Bladder  Appears normal for degree of bladder distention.  IMPRESSION: 3 mm nonobstructing mid right renal calculus.  Otherwise negative exam.   Electronically Signed   By: Mark  Boles M.D.   On: 07/07/2013 08:48   Nm Hepato W/eject Fract  07/11/2013   CLINICAL DATA:  Pain.  Nausea and vomiting.  EXAM: NUCLEAR MEDICINE HEPATOBILIARY IMAGING WITH GALLBLADDER EF  TECHNIQUE: Sequential images of the abdomen were obtained out to 60 minutes following intravenous administration of radiopharmaceutical. After slow intravenous infusion of 1.67 micrograms Cholecystokinin, gallbladder ejection fraction was determined.  COMPARISON:  Ultrasound 07/08/2013.  RADIOPHARMACEUTICALS:  5mCiTc-99m Choletec  FINDINGS: Liver, gallbladder, biliary system, and bowel appear normal. At 30 min, normal ejection fraction is greater than 30%.  The patient did not experience symptoms during CCK infusion.  IMPRESSION: Normal exam.   Electronically Signed   By: Thomas  Register   On: 07/11/2013 12:50   Dg Chest Port 1 View  07/28/2013   CLINICAL DATA:  Respiratory distress, shortness of breath.  EXAM: PORTABLE CHEST - 1 VIEW  COMPARISON:  Chest radiograph July 27, 2013 at 6:24 a.m.  FINDINGS: Cardiac silhouette is scratch the upper limits of normal, status post median sternotomy for apparent Coronary artery bypass grafting. Increasing central pulmonary vasculature congestion, worsening interstitial prominence with new right lower lobe airspace opacity. Small to moderate right pleural effusion extending into the   fissure. Small left pleural effusion.  Interval apparent extubation. Left internal jugular central venous catheter with distal tip projecting in proximal superior vena cava. Nasogastric tube in place with side port projecting at gastroesophageal junction. Right upper extremity catheter may reflect a PICC, distal tip appears to terminate in the right axilla  and was present previously. Multiple EKG lines overlie the patient and may obscure subtle underlying pathology.  IMPRESSION: Worsening interstitial and alveolar airspace opacities may reflect fluid overload, with increasing small to moderate right, small left pleural effusions.  Apparent interval extubation without change in remaining life support lines.   Electronically Signed   By: Courtnay  Bloomer   On: 07/28/2013 00:13   Dg Chest Port 1 View  07/27/2013   CLINICAL DATA:  Check endotracheal tube placement  EXAM: PORTABLE CHEST - 1 VIEW  COMPARISON:  07/26/2013  FINDINGS: Cardiac shadow is stable. A left central venous line is again noted in the mid superior vena cava. The endotracheal tube is noted 6.6 cm above the carinal. A nasogastric catheter is seen within the stomach. Stable changes are noted in the bases bilaterally. No new focal abnormality is seen.  IMPRESSION: No significant interval change from the prior exam.   Electronically Signed   By: Mark  Lukens M.D.   On: 07/27/2013 08:09   Dg Chest Port 1 View  07/26/2013   CLINICAL DATA:  Followup pneumonia  EXAM: PORTABLE CHEST - 1 VIEW  COMPARISON:  07/25/2013  FINDINGS: A left-sided jugular central venous line is again noted in the proximal superior vena cava. The endotracheal tube and nasogastric catheter are stable in appearance and within normal position. The cardiac shadow is stable. Postsurgical changes are again noted. Increasing effusion is noted in the right base. The lungs are otherwise stable.  IMPRESSION: Increasing right basilar effusion.   Electronically Signed   By: Mark  Lukens M.D.   On: 07/26/2013 07:21   Dg Chest Port 1 View  07/25/2013   CLINICAL DATA:  Respiratory failure and bowel resection.  EXAM: PORTABLE CHEST - 1 VIEW  COMPARISON:  07/24/2013  FINDINGS: Endotracheal tube tip approximately 4.5 cm above the carina. Central line positioning is stable. Lungs show improved aeration of both lower lobes. Underlying chronic  lung disease is stable.  IMPRESSION: Improved aeration of both lower lobes.   Electronically Signed   By: Glenn  Yamagata M.D.   On: 07/25/2013 07:00   Dg Chest Port 1 View  07/24/2013   CLINICAL DATA:  Endotracheal tube position. Decreased oxygen saturations.  EXAM: PORTABLE CHEST - 1 VIEW  COMPARISON:  07/23/2013  FINDINGS: Endotracheal tube is in place with tip likely just above the carina. Consider withdrawing endotracheal tube approximately 2 cm. Nasogastric tube is in place with tip off the film but beyond the lower esophagus. Left IJ central line tip overlies the level of the superior vena cava. Bilateral lower lobe infiltrates/effusions again noted.  IMPRESSION: 1. Endotracheal tube tip just above the carina. 2. Bilateral lower lobe opacities, similar in appearance to prior study. Findings are consistent with infiltrates and effusions.   Electronically Signed   By: Beth  Brown M.D.   On: 07/24/2013 01:57   Dg Chest Port 1 View  07/23/2013   CLINICAL DATA:  Ventilated patient.  History of abdominal surgery.  EXAM: PORTABLE CHEST - 1 VIEW  COMPARISON:  07/22/2013  FINDINGS: Endotracheal tube is 4.9 cm above the carina. Jugular central venous catheter in the upper SVC region. Nasogastric tube extends into the abdomen. Increased   densities in the right lower chest could represent developing airspace disease or asymmetric edema. Slightly increased densities at the left lung base suggest atelectasis and suspect small pleural effusions. Heart size is stable. No evidence for a pneumothorax.  IMPRESSION: Increased densities in the right lower chest are concerning for developing airspace disease or asymmetric edema.  Probable small pleural effusions.   Electronically Signed   By: Adam  Henn M.D.   On: 07/23/2013 08:08   Dg Chest Port 1 View  07/22/2013   CLINICAL DATA:  Status post laparotomy and small bowel resection for bowel infarction.  EXAM: PORTABLE CHEST - 1 VIEW  COMPARISON:  07/21/2013.  FINDINGS:  Endotracheal tube present with the tip approximately 5 cm above the carina. Central line tip lies in the upper SVC. Lungs show stable chronic disease without evidence of overt edema or airspace consolidation. The heart size is stable and within normal limits.  IMPRESSION: Stable chronic lung disease.   Electronically Signed   By: Glenn  Yamagata M.D.   On: 07/22/2013 10:41   Dg Chest Port 1 View  07/21/2013   CLINICAL DATA:  Endotracheal tube and line placement  EXAM: PORTABLE CHEST - 1 VIEW  COMPARISON:  07/18/2013  FINDINGS: Endotracheal tube tip lies 1.5 cm above the carina. A left internal jugular central venous line tip lies in the mid superior vena cava. A nasogastric tube has also been placed. Its tip lies at the GE junction. It will need to be further inserted to allow the tip to fully into the stomach.  No pneumothorax evident on this semi-erect study.  Lung hyperexpansion, chronic interstitial thickening and areas of lung scarring and probable basilar subsegmental atelectasis are stable. No area of focal consolidation and no overt pulmonary edema.  IMPRESSION: 1. Endotracheal tube and left internal jugular central venous line are well positioned. No pneumothorax. 2. Nasogastric tube tip lies at the GE junction. Recommend further inserting tube 10-15 cm to allow the tip to fully into the stomach. 3. No change in the appearance of the lungs when allowing for differences in technique and patient positioning.   Electronically Signed   By: David  Ormond M.D.   On: 07/21/2013 10:39   Dg Chest Port 1 View  07/18/2013   CLINICAL DATA:  Vomiting.  Smoker.  EXAM: PORTABLE CHEST - 1 VIEW  COMPARISON:  07/06/2013.  FINDINGS: Normal sized heart. Post CABG changes. Decreased inspiration with minimal bibasilar atelectasis. Atheromatous arterial calcifications. Stable right basilar scarring. Stable prominence of the interstitial markings.  IMPRESSION: 1. Poor inspiration with minimal bibasilar atelectasis. 2. Stable  changes of COPD with right basilar scarring.   Electronically Signed   By: Steve  Reid M.D.   On: 07/18/2013 14:28   Dg Abd Portable 1v  07/28/2013   CLINICAL DATA:  Nasogastric tube placement.  EXAM: PORTABLE ABDOMEN - 1 VIEW  COMPARISON:  07/21/2013 CT.  FINDINGS: Nasogastric tube tip distal esophagus level. This needs to be advanced.  Gas-filled slightly dilated small bowel loops. This represents a significant improvement from the prior CT. Patient has had interval surgery.  Basilar atelectasis.  Calcified aorta branch vessels.  Common iliac stents in place.  Residual barium in the rectum.  The possibility of free intraperitoneal air cannot be assessed on a supine view.  IMPRESSION: Nasogastric tube tip distal esophagus level. This needs to be advanced.  Please see above.  This is a call report.   Electronically Signed   By: Steve  Olson M.D.   On: 07/28/2013   14:25   Ct Angio Abd/pel W/ And/or W/o  07/21/2013   CLINICAL DATA:  Epigastric abdominal pain, nausea, vomiting and weight loss. Endoscopy on 12/07 demonstrates severe erosive ulcerative gastritis as well as duodenitis and jejunitis.  EXAM: CT ANGIOGRAPHY ABDOMEN AND PELVIS WITH CONTRAST AND WITHOUT CONTRAST  TECHNIQUE: Multidetector CT imaging of the abdomen and pelvis was performed using the standard protocol during bolus administration of intravenous contrast. Multiplanar reconstructed images including MIPs were obtained and reviewed to evaluate the vascular anatomy.  CONTRAST:  100mL OMNIPAQUE IOHEXOL 350 MG/ML SOLN  COMPARISON:  Standard CT of the abdomen and pelvis with contrast on 07/18/2013.  FINDINGS: The aorta and major visceral branches are heavily calcified with diffuse atherosclerosis present. The origin of the celiac axis shows critical narrowing and likely near subtotal occlusive disease. Distal branches are opacified and the celiac trunk is likely not completely occluded.  The proximal superior mesenteric artery shows heavily  calcified plaque extending for a long distance down the trunk of the SMA. The proximal SMA trunk is completely occluded with reconstitution more distally by jejunal branches related to collateral reconstitution from celiac and IMA supplied. The inferior mesenteric artery origin is heavily calcified but open.  Bilateral renal artery stents are identified which are open. Bilateral common iliac artery stents are also present which are open. The external iliac arteries are diffusely diseased with heavily calcified plaque.  There is likely critical stenosis and near subtotal occlusion at the level of the mid right external iliac artery. Moderate narrowing of the distal external iliac artery is also present just above the inguinal ligament approaching 70-75% narrowing. At the level of the right groin, postsurgical changes are seen likely related to prior femoral bypass with an occluded proximal bypass graft present. The native SFA is also occluded at its origin.  Diffuse disease of the left external iliac artery present without significant stenosis. The common femoral artery shows irregular plaque causing approximately 50% narrowing just below the inguinal ligament. Postsurgical changes are seen in the left groin with an open proximal bypass graft identified in the anterior thigh. The native SFA is occluded at its origin.  Nonvascular evaluation shows significant interval distension of small bowel loops which are diffusely dilated and fluid-filled. There may be some pneumatosis in the transverse duodenum and proximal jejunum as well, concerning for potential evolution of bowel ischemia to necrosis. The colon is completely decompressed. There is no evidence of free intraperitoneal air or focal abscess. The gallbladder shows some increased distention since the prior study. There is no evidence of overt biliary obstruction, however. Stable left adrenal mass identified. This is fairly low density and likely an incidental  adenoma.  Review of the MIP images confirms the above findings.  IMPRESSION: 1. 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 2. 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 3. Heavily calcified but open inferior mesenteric artery. 4. 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 Postsurgical changes are evident in the right groin with occlusion of a previously placed proximal bypass graft. 5. Open left femoral bypass graft. 6. Significant interval distension of small bowel loops which are diffusely dilated and filled with fluid. There also may be a pneumatosis in the duodenum and proximal jejunum concerning for severe ischemia/necrosis. Surgical consultation would be recommended. Critical Value/emergent results were called by telephone at the time of interpretation on 07/21/2013   at 8:00 AM to Dr.Prashant Singh, who verbally acknowledged these results.   Electronically Signed   By: Glenn  Yamagata M.D.   On: 07/21/2013 08:35   Anti-infectives: Anti-infectives   Start     Dose/Rate Route Frequency Ordered Stop   07/25/13 1115  vancomycin (VANCOCIN) IVPB 1000 mg/200 mL premix     1,000 mg 200 mL/hr over 60 Minutes Intravenous To Surgery 07/25/13 1108 07/25/13 1111   07/22/13 2200  vancomycin (VANCOCIN) IVPB 1000 mg/200 mL premix     1,000 mg 200 mL/hr over 60 Minutes Intravenous Every 12 hours 07/22/13 2056     07/21/13 1000  fluconazole (DIFLUCAN) IVPB 200 mg     200 mg 100 mL/hr over 60 Minutes Intravenous Every 24 hours 07/21/13 0944     07/21/13 0945  metroNIDAZOLE (FLAGYL) IVPB 500 mg  Status:  Discontinued     500 mg 100 mL/hr over 60 Minutes Intravenous Every 8 hours 07/21/13 0933 07/25/13 0918   07/21/13 0945  piperacillin-tazobactam (ZOSYN) IVPB 3.375 g     3.375 g 12.5 mL/hr over 240 Minutes Intravenous 3 times  per day 07/21/13 0944     07/21/13 0800  vancomycin (VANCOCIN) IVPB 1000 mg/200 mL premix  Status:  Discontinued     1,000 mg 200 mL/hr over 60 Minutes Intravenous Every 12 hours 07/21/13 0626 07/22/13 1045   07/21/13 0615  ceFEPIme (MAXIPIME) 1 g in dextrose 5 % 50 mL IVPB  Status:  Discontinued     1 g 100 mL/hr over 30 Minutes Intravenous 3 times per day 07/21/13 0613 07/21/13 0919      Assessment/Plan: s/p Procedure(s): THROMBECTOMY OF  FEMORAL ARTERY (Right) Had a long discussion with the patient and his wife present. I explained that his only option would be above-knee amputation. With some blistering over his calf and tenderness in his calf I do not feel he has adequate flow for below knee amputation. The patient wishes to proceed as soon as possible due to the pain. I did explain that he would have to be back on the respirator and would probably be re\re weaned again. I did speak with Dr. Sood with critical care this morning who agrees with this plan. We will keep him extubated postop with re\re weaned once he is back in surgical intensive.. I spoke with Dr. Brabham who will be doing the surgery. The patient and wife understand that Dr. Brabham will be doing the surgery.  LOS: 11 days   EARLY, TODD 07/29/2013, 8:17 AM    

## 2013-07-29 NOTE — Progress Notes (Signed)
[facility] ADULT ICU REPLACEMENT PROTOCOL FOR AM LAB REPLACEMENT ONLY  The patient does apply for the [facility] Adult ICU Electrolyte Replacment Protocol based on the criteria listed below:   1. Is GFR >/= 40 ml/min? yes  Patient's GFR today is 88 2. Is urine output >/= 0.5 ml/kg/hr for the last 6 hours? yes Patient's UOP is 3.9 ml/kg/hr 3. Is BUN < 60 mg/dL? yes  Patient's BUN today is 20 4. Abnormal electrolyte(s): K 3.4, Mag 1.7 5. Ordered repletion with: per protocol 6. If a panic level lab has been reported, has the CCM MD in charge been notified? no.   Physician:    Markus Daft A 07/29/2013 5:04 AM

## 2013-07-29 NOTE — Anesthesia Preprocedure Evaluation (Addendum)
Anesthesia Evaluation  Patient identified by MRN, date of birth, ID band Patient awake    Reviewed: Allergy & Precautions, H&P , Patient's Chart, lab work & pertinent test results, reviewed documented beta blocker date and time   History of Anesthesia Complications Negative for: history of anesthetic complications  Airway Mallampati: II TM Distance: >3 FB Neck ROM: full    Dental   Pulmonary COPDCurrent Smoker,  breath sounds clear to auscultation        Cardiovascular Exercise Tolerance: Good hypertension, + CAD, + Past MI and + Peripheral Vascular Disease + dysrhythmias Rhythm:regular Rate:Normal     Neuro/Psych PSYCHIATRIC DISORDERS Anxiety Depression  Neuromuscular disease    GI/Hepatic PUD, GERD-  ,  Endo/Other    Renal/GU Renal disease     Musculoskeletal   Abdominal   Peds  Hematology  (+) anemia ,   Anesthesia Other Findings   Reproductive/Obstetrics                         Anesthesia Physical Anesthesia Plan  ASA: IV and emergent  Anesthesia Plan: General ETT   Post-op Pain Management:    Induction:   Airway Management Planned:   Additional Equipment:   Intra-op Plan:   Post-operative Plan:   Informed Consent: I have reviewed the patients History and Physical, chart, labs and discussed the procedure including the risks, benefits and alternatives for the proposed anesthesia with the patient or authorized representative who has indicated his/her understanding and acceptance.   Dental Advisory Given  Plan Discussed with: CRNA and Surgeon  Anesthesia Plan Comments:        Anesthesia Quick Evaluation Previously infarcted bowel.  Platelets 46k.  Critically ill.  Will plan to leave patient intubated post op.  Arterial line and central line in place

## 2013-07-29 NOTE — Progress Notes (Signed)
PULMONARY  / CRITICAL CARE MEDICINE  Name: [name] MRN: [id number] DOB: 04-19-1935    ADMISSION DATE:  07/18/2013 CONSULTATION DATE: 07/21/2013  REFERRING MD :  [facility] PRIMARY SERVICE: PCCM  CHIEF COMPLAINT:  ARF  BRIEF PATIENT DESCRIPTION:  78 yo with ischemic cardiomyopathy, severe PAD admitted with abdominal pain.  CT abdomen demonstrated extensive ischemic changes. Was urgently intubated for shock and respiratory failure and transferred to ICU.  SIGNIFICANT EVENTS / STUDIES:  12-8 intubated, shock, surgical evaluation 12-8 DNR established  12/8 Exploratory laparotomy with entrectomy (majority of jejunum and proximal ileum) 12/9 Segmental small bowel resection terminal ileum  Two anastomoses to re-establish intestinal continuity 12/11 requires higher PEEP/FIO2 12/12 s/p R LE endarterectomy (Dr Early)  12/16 To OR for Rt AKA, returned intubated with 80% fio2  LINES / TUBES: 12- 8 OTT>>12/14, 12-16 intubated in or>> 12-8 Rt fem cvl>>12-8 12-8 Lt IJ cvl>> R axillary aline 12/8 >>   CULTURES: 12-8 bc x 2>>ng 12-8 sputum>>e coli S to ceftx / zosyn  ANTIBIOTICS: 12-8 vanc>> 12-8 diflucan>> 12-8 pip tazo>>  SUBJECTIVE:  Sedated on vent  VITAL SIGNS: Temp:  [97.3 F (36.3 C)-97.9 F (36.6 C)] 97.3 F (36.3 C) (12/16 0816) Pulse Rate:  [70-92] 70 (12/16 1215) Resp:  [14-30] 15 (12/16 1215) BP: (98-124)/(34-86) 102/37 mmHg (12/16 1000) SpO2:  [86 %-100 %] 100 % (12/16 1415) Arterial Line BP: (113-138)/(37-52) 124/38 mmHg (12/16 0600) FiO2 (%):  [50 %-100 %] 80 % (12/16 1422) Weight:  [181 lb (82.1 kg)] 181 lb (82.1 kg) (12/16 0500) HEMODYNAMICS: CVP:  [6 mmHg-12 mmHg] 12 mmHg VENTILATOR SETTINGS: Vent Mode:  [-] PRVC FiO2 (%):  [50 %-100 %] 80 % Set Rate:  [15 bmp] 15 bmp Vt Set:  [620 mL] 620 mL PEEP:  [5 cmH20] 5 cmH20 Plateau Pressure:  [18 cmH20] 18 cmH20 INTAKE / OUTPUT: Intake/Output     12/15 0701 - 12/16 0700 12/16 0701 - 12/17 0700   I.V. (mL/kg)  633.3 (7.7) 280 (3.4)   NG/GT 90    IV Piggyback 1062.5    TPN 2087.6 372   Total Intake(mL/kg) 3873.4 (47.2) 652 (7.9)   Urine (mL/kg/hr) 6580 (3.3) 600 (1)   Emesis/NG output 250 (0.1)    Total Output 6830 600   Net -2956.6 +52        Stool Occurrence 2 x      PHYSICAL EXAMINATION: General:  Elderly male,sdated on vent Neuro:   Sedated but mae x 3 1/2 HEENT: Edentulous, No JVD/LAN, NRB , ott-> vent Cardiovascular:  s1s2 rrr Lungs:  resps even non labored on NRB, coarse R>L, few scattered bibasilar crackles  Abdomen:  Tense/Tender, OGT with bilious drainage, midline incision  Musculoskeletal:  Wasted musculature Skin:  Rt lower ext  Has been amputated  LABS:  CBC  Recent Labs Lab 07/27/13 0400 07/28/13 0405 07/29/13 0400  WBC 9.9 12.7* 13.0*  HGB 9.5* 9.8* 9.2*  HCT 28.5* 29.7* 28.0*  PLT 34* 41* 46*   Coag's  Recent Labs Lab 07/25/13 0431 07/25/13 1100 07/26/13 0430  APTT 44* 42* 45*  INR 1.59* 1.58* 1.53*   BMET  Recent Labs Lab 07/28/13 0405 07/28/13 1740 07/29/13 0400  NA 135 138 136  K 3.6 3.2* 3.4*  CL 104 103 102  CO2 25 27 29   BUN 19 19 20   CREATININE 0.73 0.75 0.73  GLUCOSE 148* 161* 131*   Electrolytes  Recent Labs Lab 07/26/13 0430 07/27/13 0400 07/28/13 0405 07/28/13 1740 07/29/13 0400  CALCIUM 7.1* 7.0* 7.4* 7.3* 7.4*  MG 1.9 1.7 1.7  --  1.7  PHOS 2.2* 2.7 2.7  --   --    Sepsis Markers  Recent Labs Lab 07/24/13 0900 07/25/13 1000  LATICACIDVEN 1.5 1.1   ABG  Recent Labs Lab 07/24/13 0905 07/26/13 0313 07/27/13 2341  PHART 7.383 7.421 7.353  PCO2ART 42.0 38.3 42.8  PO2ART 169.0* 96.6 61.0*   Liver Enzymes  Recent Labs Lab 07/25/13 0330 07/26/13 0430 07/27/13 0400  AST 277* 130* 106*  ALT 694* 393* 237*  ALKPHOS 178* 168* 168*  BILITOT 3.5* 2.8* 2.6*  ALBUMIN 1.6* 1.5* 1.3*   Cardiac Enzymes  Recent Labs Lab 07/24/13 1800 07/25/13 0330 07/26/13 0430  TROPONINI 1.54* 1.22* 0.74*    Glucose  Recent Labs Lab 07/28/13 1148 07/28/13 1607 07/28/13 2007 07/29/13 0008 07/29/13 0422 07/29/13 0814  GLUCAP 144* 127* 170* 136* 127* 127*    Imaging Dg Chest Port 1 View  07/29/2013   CLINICAL DATA:  Evaluate endotracheal tube placement.  EXAM: PORTABLE CHEST - 1 VIEW  COMPARISON:  07/29/2013  FINDINGS: Endotracheal tube is 5.1 cm above the carina. Nasogastric tube extends into the abdomen but the tip is beyond the image. Jugular central venous catheter tip in the upper SVC region. Heart size is grossly normal. The thoracic aorta is heavily calcified. No change in the bilateral patchy airspace disease. Probable pleural fluid at the lung bases.  IMPRESSION: The endotracheal tube is appropriately position.  Minimal change in the bilateral parenchymal lung disease. Probable bilateral pleural effusions.  Atherosclerotic disease.   Electronically Signed   By: Richarda Overlie M.D.   On: 07/29/2013 13:02   Dg Chest Portable 1 View  07/29/2013   CLINICAL DATA:  Respiratory distress.  EXAM: PORTABLE CHEST - 1 VIEW  COMPARISON:  07/27/2013  FINDINGS: There is hyperinflation of the lungs compatible with COPD. Prior CABG. Left central line and NG tube are unchanged. Mild cardiomegaly. Bilateral airspace opacities and interstitial disease again noted. Small bilateral effusions are stable. No significant change since prior study.  IMPRESSION: COPD.  Stable bilateral interstitial and alveolar opacities, likely edema.  Stable small bilateral effusions.   Electronically Signed   By: Charlett Nose M.D.   On: 07/29/2013 08:15   Dg Chest Port 1 View  07/28/2013   CLINICAL DATA:  Respiratory distress, shortness of breath.  EXAM: PORTABLE CHEST - 1 VIEW  COMPARISON:  Chest radiograph July 27, 2013 at 6:24 a.m.  FINDINGS: Cardiac silhouette is scratch the upper limits of normal, status post median sternotomy for apparent Coronary artery bypass grafting. Increasing central pulmonary vasculature  congestion, worsening interstitial prominence with new right lower lobe airspace opacity. Small to moderate right pleural effusion extending into the fissure. Small left pleural effusion.  Interval apparent extubation. Left internal jugular central venous catheter with distal tip projecting in proximal superior vena cava. Nasogastric tube in place with side port projecting at gastroesophageal junction. Right upper extremity catheter may reflect a PICC, distal tip appears to terminate in the right axilla and was present previously. Multiple EKG lines overlie the patient and may obscure subtle underlying pathology.  IMPRESSION: Worsening interstitial and alveolar airspace opacities may reflect fluid overload, with increasing small to moderate right, small left pleural effusions.  Apparent interval extubation without change in remaining life support lines.   Electronically Signed   By: Awilda Metro   On: 07/28/2013 00:13   Dg Abd Portable 1v  07/28/2013   CLINICAL DATA:  Nasogastric tube placement.  EXAM: PORTABLE ABDOMEN - 1 VIEW  COMPARISON:  07/21/2013 CT.  FINDINGS: Nasogastric tube tip distal esophagus level. This needs to be advanced.  Gas-filled slightly dilated small bowel loops. This represents a significant improvement from the prior CT. Patient has had interval surgery.  Basilar atelectasis.  Calcified aorta branch vessels.  Common iliac stents in place.  Residual barium in the rectum.  The possibility of free intraperitoneal air cannot be assessed on a supine view.  IMPRESSION: Nasogastric tube tip distal esophagus level. This needs to be advanced.  Please see above.  This is a call report.   Electronically Signed   By: Bridgett Larsson M.D.   On: 07/28/2013 14:25    ASSESSMENT / PLAN:  PULMONARY A: Acute resp failure - worsening hypoxia/ALI 12/11 G neg- aspn pneumonia COPD with tobacco abuse hx Resp + metab alkalosis Intubated in or 12-16 for aka P:   -schedule BD's -f/u CXR -vent  post-op -vent bundle  CARDIOVASCULAR A:  Shock presumed sepsis, SIRS response - improved CAD/ isch cardiomyopathy -EF 35% Vasculopathy -ischemic RLE > s/p endarterectomy 12/12 NSTEMI Volume overload/ pulm edema  A fib P:  - Rt AKA 12/16 -Even to negative fluid balance as tolerated -continue amiodarone >> likely transition to IV after surgery  RENAL A:   Metabolic acidosis >> resolved. Hypokalemia P:   -f/u renal fx, urine outpt -replace electrolytes as needed  GASTROINTESTINAL A:   SBO/Ischemia s/p extensive SB resection Shock liver -LFTs improving H/o antral ulcers P:   -CCS following -continue TNA   HEMATOLOGIC A:   Thrombocytopenia Coagulopathy-  With low fibrinogen, low grade DIC picture P:   -scds  -Follow CBC -defer PLT transfusion to [facility] in anticipation of surgery 12/16  INFECTIOUS A:   E coli PNA. Peritonitis. P:   -continue vancomycin, zoysn, diflucan for now  ENDOCRINE A:   Hyperglycemia P:   -SSI  NEUROLOGIC A:  Pain control. P:   fent for pain  Brett Canales Minor ACNP [facility] Pager [phone] till 3 pm If no answer page [phone] 07/29/2013, 2:34 PM   Lonia Farber, MD Pulmonary and Critical Care Medicine [facility] Pager: [phone]

## 2013-07-29 NOTE — Progress Notes (Signed)
4 Days Post-Op  Subjective: He is alert complaining of RLE pain.  He is going to the OR today for  Right BKA.  NG is still in, 250 ml from NG recorded yesterday.  He has 2 BM's recorded. Objective: Vital signs in last 24 hours: Temp:  [97.2 F (36.2 C)-97.9 F (36.6 C)] 97.3 F (36.3 C) (12/16 0816) Pulse Rate:  [71-92] 86 (12/16 0800) Resp:  [14-27] 24 (12/16 0800) BP: (98-128)/(34-79) 117/46 mmHg (12/16 0800) SpO2:  [86 %-99 %] 94 % (12/16 0800) Arterial Line BP: (113-138)/(37-83) 124/38 mmHg (12/16 0600) FiO2 (%):  [35 %-100 %] 100 % (12/16 0800) Weight:  [82.1 kg (181 lb)] 82.1 kg (181 lb) (12/16 0500) Last BM Date: 07/28/13 Npo/TNA Afebrile, VSS Labs stable Intake/Output from previous day: 12/15 0701 - 12/16 0700 In: 3873.4 [I.V.:633.3; NG/GT:90; IV Piggyback:1062.5; TPN:2087.6] Out: 6830 [Urine:6580; Emesis/NG output:250] Intake/Output this shift: Total I/O In: 226 [I.V.:40; TPN:186] Out: 75 [Urine:75]  General appearance: alert, cooperative and no distress GI: soft, not really tender, he is more concerned with pain RLE.  BS hypoactive.  Lab Results:   Recent Labs  07/28/13 0405 07/29/13 0400  WBC 12.7* 13.0*  HGB 9.8* 9.2*  HCT 29.7* 28.0*  PLT 41* 46*    BMET  Recent Labs  07/28/13 1740 07/29/13 0400  NA 138 136  K 3.2* 3.4*  CL 103 102  CO2 27 29  GLUCOSE 161* 131*  BUN 19 20  CREATININE 0.75 0.73  CALCIUM 7.3* 7.4*   PT/INR No results found for this basename: LABPROT, INR,  in the last 72 hours   Recent Labs Lab 07/23/13 0403 07/24/13 0346 07/25/13 0330 07/26/13 0430 07/27/13 0400  AST 2084* 818* 277* 130* 106*  ALT 1268* 903* 694* 393* 237*  ALKPHOS 151* 152* 178* 168* 168*  BILITOT 2.2* 3.3* 3.5* 2.8* 2.6*  PROT 4.1* 3.6* 3.7* 3.6* 3.6*  ALBUMIN 1.8* 1.5* 1.6* 1.5* 1.3*     Lipase     Component Value Date/Time   LIPASE 11 07/21/2013 1010     Studies/Results: Dg Chest Portable 1 View  07/29/2013   CLINICAL DATA:   Respiratory distress.  EXAM: PORTABLE CHEST - 1 VIEW  COMPARISON:  07/27/2013  FINDINGS: There is hyperinflation of the lungs compatible with COPD. Prior CABG. Left central line and NG tube are unchanged. Mild cardiomegaly. Bilateral airspace opacities and interstitial disease again noted. Small bilateral effusions are stable. No significant change since prior study.  IMPRESSION: COPD.  Stable bilateral interstitial and alveolar opacities, likely edema.  Stable small bilateral effusions.   Electronically Signed   By: Charlett Nose M.D.   On: 07/29/2013 08:15   Dg Chest Port 1 View  07/28/2013   CLINICAL DATA:  Respiratory distress, shortness of breath.  EXAM: PORTABLE CHEST - 1 VIEW  COMPARISON:  Chest radiograph July 27, 2013 at 6:24 a.m.  FINDINGS: Cardiac silhouette is scratch the upper limits of normal, status post median sternotomy for apparent Coronary artery bypass grafting. Increasing central pulmonary vasculature congestion, worsening interstitial prominence with new right lower lobe airspace opacity. Small to moderate right pleural effusion extending into the fissure. Small left pleural effusion.  Interval apparent extubation. Left internal jugular central venous catheter with distal tip projecting in proximal superior vena cava. Nasogastric tube in place with side port projecting at gastroesophageal junction. Right upper extremity catheter may reflect a PICC, distal tip appears to terminate in the right axilla and was present previously. Multiple EKG lines overlie the patient  and may obscure subtle underlying pathology.  IMPRESSION: Worsening interstitial and alveolar airspace opacities may reflect fluid overload, with increasing small to moderate right, small left pleural effusions.  Apparent interval extubation without change in remaining life support lines.   Electronically Signed   By: Awilda Metro   On: 07/28/2013 00:13   Dg Abd Portable 1v  07/28/2013   CLINICAL DATA:  Nasogastric  tube placement.  EXAM: PORTABLE ABDOMEN - 1 VIEW  COMPARISON:  07/21/2013 CT.  FINDINGS: Nasogastric tube tip distal esophagus level. This needs to be advanced.  Gas-filled slightly dilated small bowel loops. This represents a significant improvement from the prior CT. Patient has had interval surgery.  Basilar atelectasis.  Calcified aorta branch vessels.  Common iliac stents in place.  Residual barium in the rectum.  The possibility of free intraperitoneal air cannot be assessed on a supine view.  IMPRESSION: Nasogastric tube tip distal esophagus level. This needs to be advanced.  Please see above.  This is a call report.   Electronically Signed   By: Bridgett Larsson M.D.   On: 07/28/2013 14:25    Medications: . amiodarone  200 mg Oral Daily  . antiseptic oral rinse  15 mL Mouth Rinse QID  . chlorhexidine  15 mL Mouth Rinse BID  . fluconazole (DIFLUCAN) IV  200 mg Intravenous Q24H  . insulin aspart  0-9 Units Subcutaneous Q4H  . levalbuterol  0.63 mg Nebulization Q6H WA  . pantoprazole (PROTONIX) IV  40 mg Intravenous Q12H  . piperacillin-tazobactam (ZOSYN)  IV  3.375 g Intravenous Q8H  . sodium chloride  10 mL Intravenous Q12H  . vancomycin  1,000 mg Intravenous Q12H   . sodium chloride 20 mL/hr at 07/29/13 0800  . Marland KitchenTPN (CLINIMIX-E) Adult 83 mL/hr at 07/29/13 0800   And  . fat emulsion 250 mL (07/29/13 0800)    Assessment/Plan Readmitted 07/18/13, by medicine with nausea/vomiting and prior admit for enteritis Ischemic and infarcted small bowel, with multiple organ failure. 1.  S/p Exploratory laparotomy with entrectomy (majority of jejunum and proximal ileum) Velora Heckler, MD - Primary, * Larina Earthly, MD - Assisting 07/21/2013. 2.  1. Exploratory laparotomy.  Massive entrectomy (majority of jejunum and proximal ileum), Dr. Gerrit Friends 07/22/13 3. 1. Exploratory laparotomy. Segmental small bowel resection terminal ileum.   Two primary anastomoses to reestablish intestinal continuity. Darnell Level  07/23/13. 4. Right femoral thrombectomy of common femoral and profundus femoris artery, Dr. Arbie Cookey 07/25/13.   Septic shock VDRF Shock liver ARF  Acutely ischemic RLE for RBKA today   PLan:  He is going back to the OR, and will come back on the  VENT.  Nothing new to add at this time.       LOS: 11 days    Naif Alabi 07/29/2013

## 2013-07-30 ENCOUNTER — Inpatient Hospital Stay (HOSPITAL_COMMUNITY): Payer: Medicare Other

## 2013-07-30 DIAGNOSIS — J449 Chronic obstructive pulmonary disease, unspecified: Secondary | ICD-10-CM

## 2013-07-30 DIAGNOSIS — D696 Thrombocytopenia, unspecified: Secondary | ICD-10-CM

## 2013-07-30 DIAGNOSIS — I7389 Other specified peripheral vascular diseases: Secondary | ICD-10-CM

## 2013-07-30 LAB — GLUCOSE, CAPILLARY
Glucose-Capillary: 147 mg/dL — ABNORMAL HIGH (ref 70–99)
Glucose-Capillary: 145 mg/dL — ABNORMAL HIGH (ref 70–99)
Glucose-Capillary: 170 mg/dL — ABNORMAL HIGH (ref 70–99)

## 2013-07-30 LAB — BLOOD GAS, ARTERIAL
Bicarbonate: 29.6 mEq/L — ABNORMAL HIGH (ref 20.0–24.0)
MECHVT: 620 mL
PEEP: 5 cmH2O
Patient temperature: 98.6
TCO2: 30.7 mmol/L (ref 0–100)
pH, Arterial: 7.498 — ABNORMAL HIGH (ref 7.350–7.450)

## 2013-07-30 LAB — COMPREHENSIVE METABOLIC PANEL
Alkaline Phosphatase: 143 U/L — ABNORMAL HIGH (ref 39–117)
CO2: 28 mEq/L (ref 19–32)
Calcium: 6.9 mg/dL — ABNORMAL LOW (ref 8.4–10.5)
Chloride: 102 mEq/L (ref 96–112)
Creatinine, Ser: 0.75 mg/dL (ref 0.50–1.35)
Glucose, Bld: 144 mg/dL — ABNORMAL HIGH (ref 70–99)

## 2013-07-30 LAB — CBC
HCT: 24.5 % — ABNORMAL LOW (ref 39.0–52.0)
MCH: 30.3 pg (ref 26.0–34.0)
MCV: 89.4 fL (ref 78.0–100.0)
RBC: 2.74 MIL/uL — ABNORMAL LOW (ref 4.22–5.81)
RDW: 18.6 % — ABNORMAL HIGH (ref 11.5–15.5)
WBC: 12.3 10*3/uL — ABNORMAL HIGH (ref 4.0–10.5)

## 2013-07-30 MED ORDER — POTASSIUM CHLORIDE 10 MEQ/50ML IV SOLN
10.0000 meq | INTRAVENOUS | Status: AC
  Administered 2013-07-30 (×2): 10 meq via INTRAVENOUS
  Filled 2013-07-30 (×2): qty 50

## 2013-07-30 MED ORDER — FUROSEMIDE 10 MG/ML IJ SOLN
40.0000 mg | Freq: Once | INTRAMUSCULAR | Status: AC
  Administered 2013-07-30: 40 mg via INTRAVENOUS
  Filled 2013-07-30: qty 4

## 2013-07-30 MED ORDER — POTASSIUM CHLORIDE 20 MEQ/15ML (10%) PO LIQD
40.0000 meq | Freq: Once | ORAL | Status: DC
  Filled 2013-07-30: qty 30

## 2013-07-30 MED ORDER — FAT EMULSION 20 % IV EMUL
250.0000 mL | INTRAVENOUS | Status: AC
  Administered 2013-07-30: 250 mL via INTRAVENOUS
  Filled 2013-07-30: qty 250

## 2013-07-30 MED ORDER — TRACE MINERALS CR-CU-F-FE-I-MN-MO-SE-ZN IV SOLN
INTRAVENOUS | Status: AC
  Administered 2013-07-30 (×2): via INTRAVENOUS
  Filled 2013-07-30: qty 2400

## 2013-07-30 NOTE — Procedures (Signed)
Extubation Procedure Note  Patient Details:   Name: KWELI GRASSEL DOB: 31-Oct-1934 MRN: [id number]   Airway Documentation:    + air leak around cuff prior to extubation.  Pt awake and follows commands.  Evaluation  O2 sats: stable throughout Complications: No apparent complications Patient did tolerate procedure well. Bilateral Breath Sounds: Rhonchi Suctioning: Oral;Airway Yes, pt able to speak.  Sats 88% on 50% VM, pt placed on NRB, sats increased to 93-97%, no stridor noted.   Jennette Kettle 07/30/2013, 10:55 AM

## 2013-07-30 NOTE — Progress Notes (Signed)
1 Day Post-Op  Subjective: On vent, went for R AKA yesterday, had BMs  Objective: Vital signs in last 24 hours: Temp:  [98.1 F (36.7 C)-99.4 F (37.4 C)] 99.4 F (37.4 C) (12/17 0801) Pulse Rate:  [67-82] 82 (12/17 0818) Resp:  [14-30] 30 (12/17 0818) BP: (90-135)/(32-86) 135/46 mmHg (12/17 0818) SpO2:  [92 %-100 %] 97 % (12/17 0818) Arterial Line BP: (89-122)/(30-73) 113/32 mmHg (12/17 0800) FiO2 (%):  [50 %-100 %] 50 % (12/17 0818) Weight:  [185 lb 13.6 oz (84.3 kg)] 185 lb 13.6 oz (84.3 kg) (12/17 0500) Last BM Date: 07/28/13  Intake/Output from previous day: 12/16 0701 - 12/17 0700 In: 3671.7 [I.V.:700; NG/GT:30; IV Piggyback:387.5; TPN:2554.2] Out: 1520 [Urine:1420; Emesis/NG output:100] Intake/Output this shift: Total I/O In: 142.5 [I.V.:20; IV Piggyback:12.5; TPN:110] Out: 65 [Urine:65]  General appearance: no distress Resp: clear to auscultation bilaterally Cardio: S1, S2 normal GI: soft, active BS, incision CDT, NT  Lab Results:   Recent Labs  07/29/13 1700 07/30/13 0415  WBC 12.4* 12.3*  HGB 8.5* 8.3*  HCT 24.7* 24.5*  PLT 47* 132*   BMET  Recent Labs  07/29/13 1700 07/30/13 0415  NA 135 135  K 3.7 3.7  CL 102 102  CO2 30 28  GLUCOSE 145* 144*  BUN 24* 28*  CREATININE 0.73 0.75  CALCIUM 7.1* 6.9*   PT/INR No results found for this basename: LABPROT, INR,  in the last 72 hours ABG  Recent Labs  07/29/13 1707 07/30/13 0407  PHART 7.420 7.498*  HCO3 28.1* 29.6*    Studies/Results: Dg Chest Port 1 View  07/30/2013   CLINICAL DATA:  Respiratory failure and status post bowel resection for necrosis and right leg amputation.  EXAM: PORTABLE CHEST - 1 VIEW  COMPARISON:  07/29/2013  FINDINGS: Endotracheal tube remains with the tip approximately 5 cm above the carina. Lungs show improved aeration on the left with less prominent atelectasis/ infiltrates present. Airspace disease of the right lung is relatively stable. No pneumothorax or  significant pleural effusions are seen. The heart size and mediastinal contours are stable.  IMPRESSION: Improved aeration of the left lung. Stable airspace disease of the right lung.   Electronically Signed   By: Irish Lack M.D.   On: 07/30/2013 08:07   Dg Chest Port 1 View  07/29/2013   CLINICAL DATA:  Evaluate endotracheal tube placement.  EXAM: PORTABLE CHEST - 1 VIEW  COMPARISON:  07/29/2013  FINDINGS: Endotracheal tube is 5.1 cm above the carina. Nasogastric tube extends into the abdomen but the tip is beyond the image. Jugular central venous catheter tip in the upper SVC region. Heart size is grossly normal. The thoracic aorta is heavily calcified. No change in the bilateral patchy airspace disease. Probable pleural fluid at the lung bases.  IMPRESSION: The endotracheal tube is appropriately position.  Minimal change in the bilateral parenchymal lung disease. Probable bilateral pleural effusions.  Atherosclerotic disease.   Electronically Signed   By: Richarda Overlie M.D.   On: 07/29/2013 13:02   Dg Chest Portable 1 View  07/29/2013   CLINICAL DATA:  Respiratory distress.  EXAM: PORTABLE CHEST - 1 VIEW  COMPARISON:  07/27/2013  FINDINGS: There is hyperinflation of the lungs compatible with COPD. Prior CABG. Left central line and NG tube are unchanged. Mild cardiomegaly. Bilateral airspace opacities and interstitial disease again noted. Small bilateral effusions are stable. No significant change since prior study.  IMPRESSION: COPD.  Stable bilateral interstitial and alveolar opacities, likely edema.  Stable  small bilateral effusions.   Electronically Signed   By: Charlett Nose M.D.   On: 07/29/2013 08:15   Dg Abd Portable 1v  07/28/2013   CLINICAL DATA:  Nasogastric tube placement.  EXAM: PORTABLE ABDOMEN - 1 VIEW  COMPARISON:  07/21/2013 CT.  FINDINGS: Nasogastric tube tip distal esophagus level. This needs to be advanced.  Gas-filled slightly dilated small bowel loops. This represents a  significant improvement from the prior CT. Patient has had interval surgery.  Basilar atelectasis.  Calcified aorta branch vessels.  Common iliac stents in place.  Residual barium in the rectum.  The possibility of free intraperitoneal air cannot be assessed on a supine view.  IMPRESSION: Nasogastric tube tip distal esophagus level. This needs to be advanced.  Please see above.  This is a call report.   Electronically Signed   By: Bridgett Larsson M.D.   On: 07/28/2013 14:25    Anti-infectives: Anti-infectives   Start     Dose/Rate Route Frequency Ordered Stop   07/29/13 2200  vancomycin (VANCOCIN) IVPB 750 mg/150 ml premix     750 mg 150 mL/hr over 60 Minutes Intravenous Every 12 hours 07/29/13 1014     07/25/13 1115  vancomycin (VANCOCIN) IVPB 1000 mg/200 mL premix     1,000 mg 200 mL/hr over 60 Minutes Intravenous To Surgery 07/25/13 1108 07/25/13 1111   07/22/13 2200  vancomycin (VANCOCIN) IVPB 1000 mg/200 mL premix  Status:  Discontinued     1,000 mg 200 mL/hr over 60 Minutes Intravenous Every 12 hours 07/22/13 2056 07/29/13 1014   07/21/13 1000  fluconazole (DIFLUCAN) IVPB 200 mg     200 mg 100 mL/hr over 60 Minutes Intravenous Every 24 hours 07/21/13 0944     07/21/13 0945  metroNIDAZOLE (FLAGYL) IVPB 500 mg  Status:  Discontinued     500 mg 100 mL/hr over 60 Minutes Intravenous Every 8 hours 07/21/13 0933 07/25/13 0918   07/21/13 0945  piperacillin-tazobactam (ZOSYN) IVPB 3.375 g     3.375 g 12.5 mL/hr over 240 Minutes Intravenous 3 times per day 07/21/13 0944     07/21/13 0800  vancomycin (VANCOCIN) IVPB 1000 mg/200 mL premix  Status:  Discontinued     1,000 mg 200 mL/hr over 60 Minutes Intravenous Every 12 hours 07/21/13 0626 07/22/13 1045   07/21/13 0615  ceFEPIme (MAXIPIME) 1 g in dextrose 5 % 50 mL IVPB  Status:  Discontinued     1 g 100 mL/hr over 30 Minutes Intravenous 3 times per day 07/21/13 1610 07/21/13 0919      Assessment/Plan: s/p Procedure(s): AMPUTATION ABOVE  KNEE (Right) S/P large length SBR - bowel function returning - if extubates today may start clears, If stays intubated would start TF Thrombocytopenia - improved S/P R AKA - per VVS VDRF - per CCM  LOS: 12 days    Kaesha Kirsch E 07/30/2013

## 2013-07-30 NOTE — Progress Notes (Signed)
    Subjective  - POD #1  S/p R AKA Now extubated [pain improved   Physical Exam:  Dressing dry       Assessment/Plan:  POD #1  Will change dressing tomorrow  [name] IV, [name] 07/30/2013 11:19 AM --  Filed Vitals:   07/30/13 1050  BP:   Pulse: 88  Temp:   Resp: 30    Intake/Output Summary (Last 24 hours) at 07/30/13 1119 Last data filed at 07/30/13 0800  Gross per 24 hour  Intake 3149.22 ml  Output   1385 ml  Net 1764.22 ml     Laboratory CBC    Component Value Date/Time   WBC 12.3* 07/30/2013 0415   HGB 8.3* 07/30/2013 0415   HCT 24.5* 07/30/2013 0415   PLT 132* 07/30/2013 0415    BMET    Component Value Date/Time   NA 135 07/30/2013 0415   K 3.7 07/30/2013 0415   CL 102 07/30/2013 0415   CO2 28 07/30/2013 0415   GLUCOSE 144* 07/30/2013 0415   BUN 28* 07/30/2013 0415   CREATININE 0.75 07/30/2013 0415   CALCIUM 6.9* 07/30/2013 0415   GFRNONAA 87* 07/30/2013 0415   GFRAA >90 07/30/2013 0415    COAG Lab Results  Component Value Date   INR 1.53* 07/26/2013   INR 1.58* 07/25/2013   INR 1.59* 07/25/2013   No results found for this basename: PTT    Antibiotics Anti-infectives   Start     Dose/Rate Route Frequency Ordered Stop   07/29/13 2200  vancomycin (VANCOCIN) IVPB 750 mg/150 ml premix     750 mg 150 mL/hr over 60 Minutes Intravenous Every 12 hours 07/29/13 1014     07/25/13 1115  vancomycin (VANCOCIN) IVPB 1000 mg/200 mL premix     1,000 mg 200 mL/hr over 60 Minutes Intravenous To Surgery 07/25/13 1108 07/25/13 1111   07/22/13 2200  vancomycin (VANCOCIN) IVPB 1000 mg/200 mL premix  Status:  Discontinued     1,000 mg 200 mL/hr over 60 Minutes Intravenous Every 12 hours 07/22/13 2056 07/29/13 1014   07/21/13 1000  fluconazole (DIFLUCAN) IVPB 200 mg     200 mg 100 mL/hr over 60 Minutes Intravenous Every 24 hours 07/21/13 0944     07/21/13 0945  metroNIDAZOLE (FLAGYL) IVPB 500 mg  Status:  Discontinued     500 mg 100 mL/hr  over 60 Minutes Intravenous Every 8 hours 07/21/13 0933 07/25/13 0918   07/21/13 0945  piperacillin-tazobactam (ZOSYN) IVPB 3.375 g     3.375 g 12.5 mL/hr over 240 Minutes Intravenous 3 times per day 07/21/13 0944     07/21/13 0800  vancomycin (VANCOCIN) IVPB 1000 mg/200 mL premix  Status:  Discontinued     1,000 mg 200 mL/hr over 60 Minutes Intravenous Every 12 hours 07/21/13 0626 07/22/13 1045   07/21/13 0615  ceFEPIme (MAXIPIME) 1 g in dextrose 5 % 50 mL IVPB  Status:  Discontinued     1 g 100 mL/hr over 30 Minutes Intravenous 3 times per day 07/21/13 0613 07/21/13 0919       V. Charlena Cross, M.D. [facility] Office: [phone] Pager:  [phone]

## 2013-07-30 NOTE — Progress Notes (Addendum)
[facility]  Subjective  - Intubated.  Communicates understanding with head movements.  Nods head yes when asked if pain is better.   Objective 107/40 70 98.8 F (37.1 C) (Axillary) 23 96%  Intake/Output Summary (Last 24 hours) at 07/30/13 0750 Last data filed at 07/30/13 1610  Gross per 24 hour  Intake 3516.72 ml  Output   1520 ml  Net 1996.72 ml    Right amputation dressing is clean and dry. No active drainage.   Assessment/Planning: POD #1 Right above-knee amputation Plan to change the dressing tomorrow. CCM plan to extubate later today per nursing   Clinton Gallant [facility] 07/30/2013 7:50 AM I have examined the patient, reviewed and agree with above.  Perrion Diesel, MD 07/30/2013 10:08 AM  Laboratory Lab Results:  Recent Labs  07/29/13 1700 07/30/13 0415  WBC 12.4* 12.3*  HGB 8.5* 8.3*  HCT 24.7* 24.5*  PLT 47* 132*   BMET  Recent Labs  07/29/13 1700 07/30/13 0415  NA 135 135  K 3.7 3.7  CL 102 102  CO2 30 28  GLUCOSE 145* 144*  BUN 24* 28*  CREATININE 0.73 0.75  CALCIUM 7.1* 6.9*    COAG Lab Results  Component Value Date   INR 1.53* 07/26/2013   INR 1.58* 07/25/2013   INR 1.59* 07/25/2013   No results found for this basename: PTT

## 2013-07-30 NOTE — Progress Notes (Signed)
PULMONARY  / CRITICAL CARE MEDICINE  Name: [name] MRN: [id number] DOB: Dec 18, 1934    ADMISSION DATE:  07/18/2013 CONSULTATION DATE: 07/21/2013  REFERRING MD :  [facility] PRIMARY SERVICE: PCCM  CHIEF COMPLAINT:  ARF  BRIEF PATIENT DESCRIPTION:  78 yo with ischemic cardiomyopathy, severe PAD admitted with abdominal pain.  CT abdomen demonstrated extensive ischemic changes. Was urgently intubated for shock and respiratory failure and transferred to ICU. Extubated then back to OR for R AKA 12/16 and remains vented again post op.   SIGNIFICANT EVENTS / STUDIES:  12-8 intubated, shock, surgical evaluation 12-8 DNR established  12/8 Exploratory laparotomy with entrectomy (majority of jejunum and proximal ileum) 12/9 Segmental small bowel resection terminal ileum  Two anastomoses to re-establish intestinal continuity 12/11 requires higher PEEP/FIO2 12/12 s/p R LE endarterectomy (Dr Early)  12/16 To OR for Rt AKA, returned intubated with 80% fio2  LINES / TUBES: 12- 8 OTT>>12/14, 12-16 intubated in or>> 12/17 12-8 Rt fem cvl>>12-8 12-8 Lt IJ cvl>> R axillary aline 12/8 >> 12/15  CULTURES: 12-8 bc x 2>>ng 12-8 sputum>>e coli S to ceftx / zosyn  ANTIBIOTICS: 12-8 vanc>> 12-8 diflucan>> 12-8 pip tazo>>  SUBJECTIVE:  S/p R AKA yesterday.  Back from OR on vent.  Tol some PS wean this am.   VITAL SIGNS: Temp:  [98.1 F (36.7 C)-99.4 F (37.4 C)] 99.4 F (37.4 C) (12/17 0801) Pulse Rate:  [67-82] 82 (12/17 0818) Resp:  [14-30] 30 (12/17 0818) BP: (90-135)/(32-86) 135/46 mmHg (12/17 0818) SpO2:  [92 %-100 %] 97 % (12/17 0818) Arterial Line BP: (89-122)/(30-73) 113/32 mmHg (12/17 0800) FiO2 (%):  [50 %-100 %] 50 % (12/17 0818) Weight:  [185 lb 13.6 oz (84.3 kg)] 185 lb 13.6 oz (84.3 kg) (12/17 0500) HEMODYNAMICS: CVP:  [4 mmHg-12 mmHg] 5 mmHg VENTILATOR SETTINGS: Vent Mode:  [-] CPAP;PSV FiO2 (%):  [50 %-100 %] 50 % Set Rate:  [15 bmp] 15 bmp Vt Set:  [620 mL] 620 mL PEEP:  [5  cmH20] 5 cmH20 Pressure Support:  [5 cmH20] 5 cmH20 Plateau Pressure:  [18 cmH20-23 cmH20] 19 cmH20 INTAKE / OUTPUT: Intake/Output     12/16 0701 - 12/17 0700 12/17 0701 - 12/18 0700   I.V. (mL/kg) 700 (8.3) 20 (0.2)   NG/GT 30    IV Piggyback 387.5 12.5   TPN 2554.2 110   Total Intake(mL/kg) 3671.7 (43.6) 142.5 (1.7)   Urine (mL/kg/hr) 1420 (0.7) 65 (0.5)   Emesis/NG output 100 (0)    Total Output 1520 65   Net +2151.7 +77.5          PHYSICAL EXAMINATION: General:  Elderly male, NAD on vent  Neuro:   Awake, alert, follows commands  HEENT: Edentulous, ETT, mm moist  Cardiovascular:  s1s2 rrr Lungs:  resps even non labored on PS 5/5, coarse R>L, few scattered bibasilar crackles  Abdomen:  Tense/Tender, OGT with bilious drainage, midline incision  Musculoskeletal:  Wasted musculature, new R AKA c/d   LABS:  CBC  Recent Labs Lab 07/29/13 0400 07/29/13 1700 07/30/13 0415  WBC 13.0* 12.4* 12.3*  HGB 9.2* 8.5* 8.3*  HCT 28.0* 24.7* 24.5*  PLT 46* 47* 132*   Coag's  Recent Labs Lab 07/25/13 0431 07/25/13 1100 07/26/13 0430  APTT 44* 42* 45*  INR 1.59* 1.58* 1.53*   BMET  Recent Labs Lab 07/29/13 0400 07/29/13 1700 07/30/13 0415  NA 136 135 135  K 3.4* 3.7 3.7  CL 102 102 102  CO2 29 30 28  BUN 20 24* 28*  CREATININE 0.73 0.73 0.75  GLUCOSE 131* 145* 144*   Electrolytes  Recent Labs Lab 07/26/13 0430 07/27/13 0400 07/28/13 0405  07/29/13 0400 07/29/13 1700 07/30/13 0415  CALCIUM 7.1* 7.0* 7.4*  < > 7.4* 7.1* 6.9*  MG 1.9 1.7 1.7  --  1.7  --  1.9  PHOS 2.2* 2.7 2.7  --   --   --   --   < > = values in this interval not displayed. Sepsis Markers  Recent Labs Lab 07/24/13 0900 07/25/13 1000  LATICACIDVEN 1.5 1.1   ABG  Recent Labs Lab 07/27/13 2341 07/29/13 1707 07/30/13 0407  PHART 7.353 7.420 7.498*  PCO2ART 42.8 45.0 38.4  PO2ART 61.0* 123.0* 76.2*   Liver Enzymes  Recent Labs Lab 07/26/13 0430 07/27/13 0400  07/30/13 0415  AST 130* 106* 47*  ALT 393* 237* 85*  ALKPHOS 168* 168* 143*  BILITOT 2.8* 2.6* 1.7*  ALBUMIN 1.5* 1.3* 1.2*   Cardiac Enzymes  Recent Labs Lab 07/24/13 1800 07/25/13 0330 07/26/13 0430  TROPONINI 1.54* 1.22* 0.74*   Glucose  Recent Labs Lab 07/29/13 1247 07/29/13 1639 07/29/13 1943 07/29/13 2308 07/30/13 0354 07/30/13 0800  GLUCAP 147* 126* 128* 93 145* 129*    Imaging Dg Chest Port 1 View  07/30/2013   CLINICAL DATA:  Respiratory failure and status post bowel resection for necrosis and right leg amputation.  EXAM: PORTABLE CHEST - 1 VIEW  COMPARISON:  07/29/2013  FINDINGS: Endotracheal tube remains with the tip approximately 5 cm above the carina. Lungs show improved aeration on the left with less prominent atelectasis/ infiltrates present. Airspace disease of the right lung is relatively stable. No pneumothorax or significant pleural effusions are seen. The heart size and mediastinal contours are stable.  IMPRESSION: Improved aeration of the left lung. Stable airspace disease of the right lung.   Electronically Signed   By: Irish Lack M.D.   On: 07/30/2013 08:07   Dg Chest Port 1 View  07/29/2013   CLINICAL DATA:  Evaluate endotracheal tube placement.  EXAM: PORTABLE CHEST - 1 VIEW  COMPARISON:  07/29/2013  FINDINGS: Endotracheal tube is 5.1 cm above the carina. Nasogastric tube extends into the abdomen but the tip is beyond the image. Jugular central venous catheter tip in the upper SVC region. Heart size is grossly normal. The thoracic aorta is heavily calcified. No change in the bilateral patchy airspace disease. Probable pleural fluid at the lung bases.  IMPRESSION: The endotracheal tube is appropriately position.  Minimal change in the bilateral parenchymal lung disease. Probable bilateral pleural effusions.  Atherosclerotic disease.   Electronically Signed   By: Richarda Overlie M.D.   On: 07/29/2013 13:02   Dg Chest Portable 1 View  07/29/2013    CLINICAL DATA:  Respiratory distress.  EXAM: PORTABLE CHEST - 1 VIEW  COMPARISON:  07/27/2013  FINDINGS: There is hyperinflation of the lungs compatible with COPD. Prior CABG. Left central line and NG tube are unchanged. Mild cardiomegaly. Bilateral airspace opacities and interstitial disease again noted. Small bilateral effusions are stable. No significant change since prior study.  IMPRESSION: COPD.  Stable bilateral interstitial and alveolar opacities, likely edema.  Stable small bilateral effusions.   Electronically Signed   By: Charlett Nose M.D.   On: 07/29/2013 08:15   Dg Abd Portable 1v  07/28/2013   CLINICAL DATA:  Nasogastric tube placement.  EXAM: PORTABLE ABDOMEN - 1 VIEW  COMPARISON:  07/21/2013 CT.  FINDINGS: Nasogastric tube tip  distal esophagus level. This needs to be advanced.  Gas-filled slightly dilated small bowel loops. This represents a significant improvement from the prior CT. Patient has had interval surgery.  Basilar atelectasis.  Calcified aorta branch vessels.  Common iliac stents in place.  Residual barium in the rectum.  The possibility of free intraperitoneal air cannot be assessed on a supine view.  IMPRESSION: Nasogastric tube tip distal esophagus level. This needs to be advanced.  Please see above.  This is a call report.   Electronically Signed   By: Bridgett Larsson M.D.   On: 07/28/2013 14:25    ASSESSMENT / PLAN:  PULMONARY A: Acute resp failure - worsening hypoxia/ALI 12/11 - extubated 12/14, back on vent post op R AKA G neg- aspiration pneumonia COPD with tobacco abuse hx P:   -scheduled BD's -prn xopenex -f/u CXR  -proceed with extubation 12/17 -pulmonary hygiene - resume chest pt   CARDIOVASCULAR A:  Shock- presumed sepsis, SIRS - resolved CAD/ isch cardiomyopathy -EF 35% Vasculopathy -ischemic RLE > s/p R AKA 12/16 NSTEMI Volume overload/ pulm edema  A fib P:  -Even to negative fluid balance as tolerated >> lasix x 1 on 12/17 -continue amiodarone    RENAL A:   Metabolic acidosis >> resolved. Hypokalemia P:   -f/u renal fx, urine outpt -replace electrolytes as needed  GASTROINTESTINAL A:   SBO/Ischemia s/p extensive SB resection Shock liver -LFTs much improved  H/o antral ulcers P:   -CCS following -continue TNA  -add clear liquid after extubation  HEMATOLOGIC A:   Thrombocytopenia Coagulopathy-  With low fibrinogen, low grade DIC picture likely related to ischemic leg >> should improve after amputation. P:   -scds  -Follow CBC  INFECTIOUS A:   E coli PNA. Peritonitis. P:   -continue vancomycin, zoysn, diflucan for now  ENDOCRINE A:   Hyperglycemia P:   -SSI  NEUROLOGIC A:  Pain control - much improved post AKA. P:   PRN fent for pain   WHITEHEART,KATHRYN, NP 07/30/2013  8:42 AM Pager: [phone] or [phone]  Reviewed above, examined pt, and agree with assessment/plan.  Tolerating SBT.  Will proceed with extubation.  Advance diet after extubation.  Updated family at bedside.  CC time 35 minutes.  Coralyn Helling, MD [facility] Pulmonary/Critical Care 07/30/2013, 10:35 AM Pager:  [phone] After 3pm call: [phone]

## 2013-07-30 NOTE — Progress Notes (Signed)
PARENTERAL NUTRITION CONSULT NOTE  Pharmacy Consult for TPN Indication: massive bowel resection  No Known Allergies  Patient Measurements: Height: 6' (182.9 cm) Weight: 185 lb 13.6 oz (84.3 kg) IBW/kg (Calculated) : 77.6  Vital Signs: Temp: 98.8 F (37.1 C) (12/17 0352) Temp src: Axillary (12/17 0352) BP: 107/40 mmHg (12/17 0700) Pulse Rate: 70 (12/17 0700) Intake/Output from previous day: 12/16 0701 - 12/17 0700 In: 3516.7 [I.V.:680; NG/GT:30; IV Piggyback:362.5; JXB:1478.2] Out: 1520 [Urine:1420; Emesis/NG output:100] Intake/Output from this shift:    Labs:  Recent Labs  07/29/13 0400 07/29/13 1700 07/30/13 0415  WBC 13.0* 12.4* 12.3*  HGB 9.2* 8.5* 8.3*  HCT 28.0* 24.7* 24.5*  PLT 46* 47* 132*     Recent Labs  07/28/13 0405  07/29/13 0400 07/29/13 1700 07/30/13 0415  NA 135  < > 136 135 135  K 3.6  < > 3.4* 3.7 3.7  CL 104  < > 102 102 102  CO2 25  < > 29 30 28   GLUCOSE 148*  < > 131* 145* 144*  BUN 19  < > 20 24* 28*  CREATININE 0.73  < > 0.73 0.73 0.75  CALCIUM 7.4*  < > 7.4* 7.1* 6.9*  MG 1.7  --  1.7  --  1.9  PHOS 2.7  --   --   --   --   PROT  --   --   --   --  3.8*  ALBUMIN  --   --   --   --  1.2*  AST  --   --   --   --  47*  ALT  --   --   --   --  85*  ALKPHOS  --   --   --   --  143*  BILITOT  --   --   --   --  1.7*  PREALBUMIN 6.4*  --   --   --   --   TRIG 140  --   --   --   --   < > = values in this interval not displayed. Estimated Creatinine Clearance: 86.2 ml/min (by C-G formula based on Cr of 0.75).    Recent Labs  07/29/13 1943 07/29/13 2308 07/30/13 0354  GLUCAP 128* 93 145*    Insulin Requirements in the past 24 hours:  2 units SSI  Current Nutrition:  NPO  Clinimix E 5/15 at 100 ml/hr and lipid 20% at 10 ml/hr provides 2184 kcal and 120 grams of protein  Nutritional Goals:  2000-2200 kCal, 110-120 grams of protein per day per RD recommendations 12/15  Admit: 52 YOM admitted with N/V, dehydration,  abdominal pain and enteritis d/t norovirus. Found to have mesenteric ischemia and infarcted small bowel. Underwent small bowel resection-majority of jejunum and proximal ileum- currently has minimal amount of small bowel needed to survive. Patient is DNR but continuing full medical care. May require long term TPN.  GI: 12/7 EGD showed changes suggestive of severe erosive ulcerative gastritis, duodenitis and jujinitis - 12/7 CT angiogram suggested diffuse calcifications of mesenteric circulation; 12/8 massive bowel resection - majority of jejunum and proximal ileum. 12/9 resection of terminal ileuim. Two anastomoses to re-establish intestinal continuity. Pt pulled out NG tube on 12/15 -- replaced. TPN started 12/10 for nutritional support. BM noted 12/15 - bowel function returning. Per surgery - if remains intubated, to start TFs. If able to be extubated, to start clears. CCM weaning vent as tolerated. No NGO recorded on  12/16, 100 cc thus far today.  Endo: No hx. CBGs/12h: 93-145. On sensitive SSI Lytes: K 3.7 (KCl 10 mEq IV x 2 ordered for replacement this a.m), Mg 1.9 << 1.7 (s/p replacement on 12/16), Na 135, Phos 2.7 (12/15), CoCa~9.2 Renal: SCr 0.75, CrCl~80-90 ml/min. UOP 0.8 ml/kg/hr. I/O 3631/3420 = +0.2 L Pulm: Hx COPD/tobacco abuse. Extubated 12/14 however re-intubated on 12/16 for surgery. 97% on fiO2 50% -- CCM weaning the vent as tolerated.  Cards: Hx CAD(CABG '95), PCI '10)/DL/HTN/PVD/Aflutter. R-AKA done on 12/16 d/t severe PVD. S/p shock -- pressors now weaned off. BP soft-wnl, HR wnl (NSR/BBB). On amiodarone, intermittent lasix Hepatobil: Shocked liver - resolving. AST/ALT 47/85 << 107/237, Alk Phos 143 << 168, TBili 1.7 << 2.6. TG 140 (12/15) << 210 (12/11), pre-albumin 6.4 (12/15) << <3 (12/11), alb 1.2 Neuro: Hx CVA/depression. GCS 15 - no sedation.  Heme: aPTT 45 seconds, PT 18, INR 1.53- improving. Patient has received vitamin K and FFP as reversal before surgeries. Hgb 8.3, plts  132. Does have ulcers and anticoagulation has been held in the past d/t GI bleed. Has low fibrinogen and high D-Dimer.  ID:  Vanc + Zosyn + Fluconazole for high risk of perforation, r/o bacterial translocation. Afebrile, WBC 12.3 << 13 Best Practices: MC, SCDs, PPI IV TPN Access: LIJ- triple lumen (placed 12/8) TPN day#: 6  (12/10 >> current)  Plan - Continue Clinimix E 5/15 at 100 ml/hr - Add MV and TE in TPN bag - Continue 20% lipids at 10 ml/hr - Continue sensitive SSI - Will f/u plans for diet progression -- either via TFs or po diet (if extubated) - Will f/u TPN labs  Georgina Pillion, PharmD, BCPS Clinical Pharmacist Pager: [phone] 07/30/2013 7:37 AM

## 2013-07-31 ENCOUNTER — Encounter (HOSPITAL_COMMUNITY): Payer: Self-pay | Admitting: Surgery

## 2013-07-31 ENCOUNTER — Inpatient Hospital Stay (HOSPITAL_COMMUNITY): Payer: Medicare Other

## 2013-07-31 LAB — GLUCOSE, CAPILLARY
Glucose-Capillary: 133 mg/dL — ABNORMAL HIGH (ref 70–99)
Glucose-Capillary: 128 mg/dL — ABNORMAL HIGH (ref 70–99)
Glucose-Capillary: 147 mg/dL — ABNORMAL HIGH (ref 70–99)
Glucose-Capillary: 125 mg/dL — ABNORMAL HIGH (ref 70–99)

## 2013-07-31 LAB — CBC
HCT: 24 % — ABNORMAL LOW (ref 39.0–52.0)
MCH: 29.4 pg (ref 26.0–34.0)
MCHC: 33.3 g/dL (ref 30.0–36.0)
Platelets: 77 10*3/uL — ABNORMAL LOW (ref 150–400)
RBC: 2.72 MIL/uL — ABNORMAL LOW (ref 4.22–5.81)
RDW: 18.6 % — ABNORMAL HIGH (ref 11.5–15.5)

## 2013-07-31 LAB — BASIC METABOLIC PANEL
CO2: 30 mEq/L (ref 19–32)
Calcium: 7.3 mg/dL — ABNORMAL LOW (ref 8.4–10.5)
GFR calc Af Amer: >90 mL/min (ref >90)
GFR calc non Af Amer: 89 mL/min — ABNORMAL LOW (ref >90)
Sodium: 134 mEq/L — ABNORMAL LOW (ref 135–145)

## 2013-07-31 LAB — MAGNESIUM: Magnesium: 1.7 mg/dL (ref 1.5–2.5)

## 2013-07-31 MED ORDER — POTASSIUM CHLORIDE 10 MEQ/50ML IV SOLN
10.0000 meq | INTRAVENOUS | Status: AC
  Administered 2013-07-31 (×3): 10 meq via INTRAVENOUS
  Filled 2013-07-31 (×3): qty 50

## 2013-07-31 MED ORDER — MAGNESIUM SULFATE IN D5W 10-5 MG/ML-% IV SOLN
1.0000 g | Freq: Once | INTRAVENOUS | Status: AC
  Administered 2013-07-31: 1 g via INTRAVENOUS
  Filled 2013-07-31: qty 100

## 2013-07-31 MED ORDER — TRACE MINERALS CR-CU-F-FE-I-MN-MO-SE-ZN IV SOLN
INTRAVENOUS | Status: AC
  Administered 2013-07-31: 17:00:00 via INTRAVENOUS
  Filled 2013-07-31: qty 2400

## 2013-07-31 MED ORDER — FAT EMULSION 20 % IV EMUL
250.0000 mL | INTRAVENOUS | Status: AC
  Administered 2013-07-31: 250 mL via INTRAVENOUS
  Filled 2013-07-31: qty 250

## 2013-07-31 NOTE — Progress Notes (Addendum)
[facility] [place]  Subjective  - Right stump is sore, stomach is not painful.     Objective 118/43 83 98.4 F (36.9 C) (Axillary) 28 97%  Intake/Output Summary (Last 24 hours) at 07/31/13 0741 Last data filed at 07/31/13 0700  Gross per 24 hour  Intake 3757.5 ml  Output   3420 ml  Net  337.5 ml    Right groin soft, incision clean and dry. Stump viable, incision clean and dry. Abdomin soft non tender  Assessment/Planning: POD #2 right AKA S/P Right femoral thrombectomy of common femoral and profundus femoris artery, Dr. Arbie Cookey 07/25/13. S/p Exploratory laparotomy with entrectomy (majority of jejunum and proximal ileum) Velora Heckler, MD - [employer]  Order for sock.  May remove right stump dressing once sock is fitted.    Clinton Gallant [facility] 07/31/2013 7:41 AM --  Laboratory Lab Results:  Recent Labs  07/30/13 0415 07/31/13 0410  WBC 12.3* 11.4*  HGB 8.3* 8.0*  HCT 24.5* 24.0*  PLT 132* 77*   BMET  Recent Labs  07/30/13 0415 07/31/13 0410  NA 135 134*  K 3.7 3.5  CL 102 100  CO2 28 30  GLUCOSE 144* 139*  BUN 28* 27*  CREATININE 0.75 0.70  CALCIUM 6.9* 7.3*    COAG Lab Results  Component Value Date   INR 1.53* 07/26/2013   INR 1.58* 07/25/2013   INR 1.59* 07/25/2013   No results found for this basename: PTT   I have examined the patient, reviewed and agree with above.  Jamien Casanova, MD 07/31/2013 8:14 AM  Agree with above. Right above-knee amputation healing quite nicely. Moderate tenderness. Right groin incision healing nicely. No abdominal tenderness. Required BiPAP through the night due to low oxygen saturation. On nonrebreather currently

## 2013-07-31 NOTE — Progress Notes (Signed)
2 Days Post-Op  Subjective: Extubated, tol some liquids, productive cough  Objective: Vital signs in last 24 hours: Temp:  [97.4 F (36.3 C)-98.4 F (36.9 C)] 97.7 F (36.5 C) (12/18 0812) Pulse Rate:  [70-112] 83 (12/18 0700) Resp:  [18-35] 28 (12/18 0700) BP: (105-126)/(43-82) 118/43 mmHg (12/18 0700) SpO2:  [88 %-100 %] 97 % (12/18 0700) Arterial Line BP: (119-141)/(36-56) 119/49 mmHg (12/18 0700) FiO2 (%):  [50 %-60 %] 50 % (12/18 0357) Weight:  [182 lb 8.7 oz (82.8 kg)] 182 lb 8.7 oz (82.8 kg) (12/18 0500) Last BM Date: 07/30/13  Intake/Output from previous day: 12/17 0701 - 12/18 0700 In: 3757.5 [I.V.:480; IV Piggyback:637.5; TPN:2640] Out: 3420 [Urine:3420] Intake/Output this shift: Total I/O In: -  Out: 100 [Urine:100]  General appearance: cooperative Resp: rhonchi B Cardio: regular rate and rhythm GI: soft, +BS, NT, incision CDI  Lab Results:   Recent Labs  07/30/13 0415 07/31/13 0410  WBC 12.3* 11.4*  HGB 8.3* 8.0*  HCT 24.5* 24.0*  PLT 132* 77*   BMET  Recent Labs  07/30/13 0415 07/31/13 0410  NA 135 134*  K 3.7 3.5  CL 102 100  CO2 28 30  GLUCOSE 144* 139*  BUN 28* 27*  CREATININE 0.75 0.70  CALCIUM 6.9* 7.3*   PT/INR No results found for this basename: LABPROT, INR,  in the last 72 hours ABG  Recent Labs  07/29/13 1707 07/30/13 0407  PHART 7.420 7.498*  HCO3 28.1* 29.6*    Studies/Results: Dg Chest Portable 1 View  07/31/2013   CLINICAL DATA:  Respiratory failure.  EXAM: PORTABLE CHEST - 1 VIEW  COMPARISON:  One-view chest 07/30/2013.  FINDINGS: A left IJ line is stable in position. The patient has been extubated. The heart is enlarged. Atherosclerotic calcifications are again noted in the aortic arch. Bilateral pleural effusions and associated airspace disease is present. A diffuse interstitial pattern is unchanged. Airspace opacification at the level of the minor fissure is slightly more prominent.  IMPRESSION: 1. Slight  increase an airspace opacification over the right midlung, likely in the superior segment of the right lower lobe. 2. Overall stable interstitial pattern, likely representing edema superimposed on chronic coarsening. 3. Status post extubation. 4. Persistent bilateral pleural effusions as the associated airspace disease, likely atelectasis.   Electronically Signed   By: Gennette Pac M.D.   On: 07/31/2013 07:57   Dg Chest Port 1 View  07/30/2013   CLINICAL DATA:  Respiratory failure and status post bowel resection for necrosis and right leg amputation.  EXAM: PORTABLE CHEST - 1 VIEW  COMPARISON:  07/29/2013  FINDINGS: Endotracheal tube remains with the tip approximately 5 cm above the carina. Lungs show improved aeration on the left with less prominent atelectasis/ infiltrates present. Airspace disease of the right lung is relatively stable. No pneumothorax or significant pleural effusions are seen. The heart size and mediastinal contours are stable.  IMPRESSION: Improved aeration of the left lung. Stable airspace disease of the right lung.   Electronically Signed   By: Irish Lack M.D.   On: 07/30/2013 08:07   Dg Chest Port 1 View  07/29/2013   CLINICAL DATA:  Evaluate endotracheal tube placement.  EXAM: PORTABLE CHEST - 1 VIEW  COMPARISON:  07/29/2013  FINDINGS: Endotracheal tube is 5.1 cm above the carina. Nasogastric tube extends into the abdomen but the tip is beyond the image. Jugular central venous catheter tip in the upper SVC region. Heart size is grossly normal. The thoracic aorta is heavily  calcified. No change in the bilateral patchy airspace disease. Probable pleural fluid at the lung bases.  IMPRESSION: The endotracheal tube is appropriately position.  Minimal change in the bilateral parenchymal lung disease. Probable bilateral pleural effusions.  Atherosclerotic disease.   Electronically Signed   By: Richarda Overlie M.D.   On: 07/29/2013 13:02    Anti-infectives: Anti-infectives   Start      Dose/Rate Route Frequency Ordered Stop   07/29/13 2200  vancomycin (VANCOCIN) IVPB 750 mg/150 ml premix     750 mg 150 mL/hr over 60 Minutes Intravenous Every 12 hours 07/29/13 1014     07/25/13 1115  vancomycin (VANCOCIN) IVPB 1000 mg/200 mL premix     1,000 mg 200 mL/hr over 60 Minutes Intravenous To Surgery 07/25/13 1108 07/25/13 1111   07/22/13 2200  vancomycin (VANCOCIN) IVPB 1000 mg/200 mL premix  Status:  Discontinued     1,000 mg 200 mL/hr over 60 Minutes Intravenous Every 12 hours 07/22/13 2056 07/29/13 1014   07/21/13 1000  fluconazole (DIFLUCAN) IVPB 200 mg     200 mg 100 mL/hr over 60 Minutes Intravenous Every 24 hours 07/21/13 0944     07/21/13 0945  metroNIDAZOLE (FLAGYL) IVPB 500 mg  Status:  Discontinued     500 mg 100 mL/hr over 60 Minutes Intravenous Every 8 hours 07/21/13 0933 07/25/13 0918   07/21/13 0945  piperacillin-tazobactam (ZOSYN) IVPB 3.375 g     3.375 g 12.5 mL/hr over 240 Minutes Intravenous 3 times per day 07/21/13 0944     07/21/13 0800  vancomycin (VANCOCIN) IVPB 1000 mg/200 mL premix  Status:  Discontinued     1,000 mg 200 mL/hr over 60 Minutes Intravenous Every 12 hours 07/21/13 0626 07/22/13 1045   07/21/13 0615  ceFEPIme (MAXIPIME) 1 g in dextrose 5 % 50 mL IVPB  Status:  Discontinued     1 g 100 mL/hr over 30 Minutes Intravenous 3 times per day 07/21/13 0613 07/21/13 0919      Assessment/Plan: s/p Procedure(s): AMPUTATION ABOVE KNEE (Right) S/P large length SBR - advance to fulls Thrombocytopenia S/P R AKA - per VVS Ongoing pulonary issues - per CCM  LOS: 13 days    Janise Gora E 07/31/2013

## 2013-07-31 NOTE — Progress Notes (Signed)
PARENTERAL NUTRITION CONSULT NOTE  Pharmacy Consult:  TPN Indication: massive bowel resection  No Known Allergies  Patient Measurements: Height: 6' (182.9 cm) Weight: 182 lb 8.7 oz (82.8 kg) IBW/kg (Calculated) : 77.6  Vital Signs: Temp: 97.7 F (36.5 C) (12/18 0812) Temp src: Oral (12/18 0812) BP: 118/43 mmHg (12/18 0700) Pulse Rate: 83 (12/18 0700) Intake/Output from previous day: 12/17 0701 - 12/18 0700 In: 3757.5 [I.V.:480; IV Piggyback:637.5; TPN:2640] Out: 3420 [Urine:3420] Intake/Output from this shift: Total I/O In: -  Out: 100 [Urine:100]  Labs:  Recent Labs  07/29/13 1700 07/30/13 0415 07/31/13 0410  WBC 12.4* 12.3* 11.4*  HGB 8.5* 8.3* 8.0*  HCT 24.7* 24.5* 24.0*  PLT 47* 132* 77*     Recent Labs  07/29/13 0400 07/29/13 1700 07/30/13 0415 07/31/13 0410  NA 136 135 135 134*  K 3.4* 3.7 3.7 3.5  CL 102 102 102 100  CO2 29 30 28 30   GLUCOSE 131* 145* 144* 139*  BUN 20 24* 28* 27*  CREATININE 0.73 0.73 0.75 0.70  CALCIUM 7.4* 7.1* 6.9* 7.3*  MG 1.7  --  1.9 1.7  PHOS  --   --   --  3.5  PROT  --   --  3.8*  --   ALBUMIN  --   --  1.2*  --   AST  --   --  47*  --   ALT  --   --  85*  --   ALKPHOS  --   --  143*  --   BILITOT  --   --  1.7*  --    Estimated Creatinine Clearance: 86.2 ml/min (by C-G formula based on Cr of 0.7).    Recent Labs  07/30/13 1933 07/30/13 2320 07/31/13 0341  GLUCAP 135* 152* 133*     Insulin Requirements in the past 24 hours:  7 units SSI  Current Nutrition:  Full liquid diet Clinimix E 5/15 at 100 ml/hr and lipid 20% at 10 ml/hr provides 2184 kcal and 120 grams of protein  Nutritional Goals:  2000-2200 kCal, 110-120 grams of protein per day  Admit: 68 YOM admitted with N/V, dehydration, abdominal pain and enteritis d/t norovirus. Found to have mesenteric ischemia and infarcted small bowel. Underwent small bowel resection-majority of jejunum and proximal ileum- currently has minimal amount of small  bowel needed to survive. Patient is DNR but continuing full medical care.  GI: 12/7 EGD showed changes suggestive of severe erosive ulcerative gastritis, duodenitis and jujinitis - 12/7 CT angiogram suggested diffuse calcifications of mesenteric circulation; 12/8 massive bowel resection - majority of jejunum and proximal ileum. 12/9 resection of terminal ileum. Two anastomoses to re-establish intestinal continuity. Pt pulled out NG tube on 12/15.  PO diet started 12/18, LBM 12/17, PPI IV Endo: No hx DM - CBGs controlled on SSI Lytes: mild hyponatremia, K and Mag at low end of normal Renal: SCr 0.7 (stable), CrCL 86 ml/min, excellent UOP with Lasix, NS at [facility] Pulm: Hx COPD / tobacco abuse. Extubated 12/14 however re-intubated on 12/16 for surgery, now extubated again, on NRM, Xopenex neb Cards: Hx CAD (CABG '95 + PCI '10) / DL / HTN / PVD / Aflutter. R-AKA done on 12/16 d/t severe PVD. S/p shock -- pressors now weaned off. BP soft, HR normal, NSR/BBB, CVP 5 - amiodarone, intermittent lasix Hepatobil: Shocked liver resolving - LFTs/tbili trending down. TG down to 140, pre-albumin improved to 6.4, alb 1.2 Neuro: Hx CVA/depression. GCS 15 - no sedation.  Heme: aPTT 45 seconds, PT 18, INR 1.53- improving. Patient has received vitamin K and FFP as reversal before surgeries. Hgb 8.3, plts 132. Does have ulcers and anticoagulation has been held in the past d/t GI bleed. Has low fibrinogen and high D-Dimer.  ID:  Vanc + Zosyn + Fluconazole for E.coli PNA + high risk of perforation, r/o bacterial translocation. Afebrile, WBC trending down Best Practices: MC, SCDs, PPI IV TPN Access: LIJ- triple lumen (placed 12/8) TPN day#: 7 (12/10 >> current)    Plan - Continue Clinimix E 5/15 at 100 ml/hr + IVFE at 10 ml/hr - Daily multivitamin and trace elements - KCL x 3 runs - Mag sulfate 1gm IV x 1, mag level when next labs is due - F/U PO intake to start weaning TPN    Katelinn Justice D. Laney Potash, PharmD, BCPS Pager:  [phone] 07/31/2013, 10:48 AM

## 2013-07-31 NOTE — Progress Notes (Signed)
NRB removed for approximately 20 sec and pt O2 sats fell to 77%. Sats recovered slowly, but sustaining 88-90% with increased WOB. RT giving breathing treatment and CPT. Dr. Sung Amabile notified. Does not want ABG at this time. BiPAP applied after breathing treatment. O2 sats 97% now. Will continue to monitor pt.

## 2013-07-31 NOTE — Progress Notes (Signed)
PULMONARY  / CRITICAL CARE MEDICINE  Name: [name] MRN: [id number] DOB: 04/21/1935    ADMISSION DATE:  07/18/2013 CONSULTATION DATE: 07/21/2013  REFERRING MD :  [facility] [place] PRIMARY SERVICE: PCCM  CHIEF COMPLAINT:  ARF  BRIEF PATIENT DESCRIPTION:  78 yo with ischemic cardiomyopathy, severe PAD admitted with abdominal pain.  CT abdomen demonstrated extensive ischemic changes. Was urgently intubated for shock and respiratory failure and transferred to ICU. Extubated then back to OR for R AKA 12/16 and remains vented again post op.   SIGNIFICANT EVENTS / STUDIES:  12-8 intubated, shock, surgical evaluation 12-8 DNR established  12/8 Exploratory laparotomy with entrectomy (majority of jejunum and proximal ileum) 12/9 Segmental small bowel resection terminal ileum  Two anastomoses to re-establish intestinal continuity 12/11 requires higher PEEP/FIO2 12/12 s/p R LE endarterectomy (Dr Early)  12/16 To OR for Rt AKA, returned intubated with 80% fio2 12-18 intermittent NIMVS on 70% NRB  LINES / TUBES: 12- 8 OTT>>12/14, 12-16 intubated in or>> 12/17>>12-17 12-8 Rt fem cvl>>12-8 12-8 Lt IJ cvl>> R axillary aline 12/8 >> 12/18  CULTURES: 12-8 bc x 2>>ng 12-8 sputum>>e coli S to ceftx / zosyn  ANTIBIOTICS: 12-8 vanc>> 12-8 diflucan>> 12-8 pip tazo>>  SUBJECTIVE:  On 70% NRB, weak cough  VITAL SIGNS: Temp:  [97.4 F (36.3 C)-98.4 F (36.9 C)] 98.4 F (36.9 C) (12/18 0342) Pulse Rate:  [70-112] 83 (12/18 0700) Resp:  [18-35] 28 (12/18 0700) BP: (105-126)/(43-82) 118/43 mmHg (12/18 0700) SpO2:  [88 %-100 %] 97 % (12/18 0700) Arterial Line BP: (119-141)/(36-56) 119/49 mmHg (12/18 0700) FiO2 (%):  [50 %-60 %] 50 % (12/18 0357) Weight:  [182 lb 8.7 oz (82.8 kg)] 182 lb 8.7 oz (82.8 kg) (12/18 0500) HEMODYNAMICS: CVP:  [4 mmHg-7 mmHg] 5 mmHg VENTILATOR SETTINGS: Vent Mode:  [-] BIPAP FiO2 (%):  [50 %-60 %] 50 % Set Rate:  [10 bmp] 10 bmp PEEP:  [6 cmH20] 6 cmH20 INTAKE /  OUTPUT: Intake/Output     12/17 0701 - 12/18 0700 12/18 0701 - 12/19 0700   I.V. (mL/kg) 480 (5.8)    NG/GT     IV Piggyback 637.5    TPN 2640    Total Intake(mL/kg) 3757.5 (45.4)    Urine (mL/kg/hr) 3420 (1.7) 100 (0.9)   Emesis/NG output     Total Output 3420 100   Net +337.5 -100        Stool Occurrence 1 x      PHYSICAL EXAMINATION: General:  Elderly male, awake but weak  Neuro:   Awake, alert, follows commands  HEENT: Edentulous,  mm moist  Cardiovascular:  s1s2 rrr Lungs:  resps shallow, coarse rhonchi bilat  Abdomen:  Soft/Tender,  midline incision , clear liquid diet Musculoskeletal:  Wasted musculature, new R AKA c/d   LABS:  CBC  Recent Labs Lab 07/29/13 1700 07/30/13 0415 07/31/13 0410  WBC 12.4* 12.3* 11.4*  HGB 8.5* 8.3* 8.0*  HCT 24.7* 24.5* 24.0*  PLT 47* 132* 77*   Coag's  Recent Labs Lab 07/25/13 0431 07/25/13 1100 07/26/13 0430  APTT 44* 42* 45*  INR 1.59* 1.58* 1.53*   BMET  Recent Labs Lab 07/29/13 1700 07/30/13 0415 07/31/13 0410  NA 135 135 134*  K 3.7 3.7 3.5  CL 102 102 100  CO2 30 28 30   BUN 24* 28* 27*  CREATININE 0.73 0.75 0.70  GLUCOSE 145* 144* 139*   Electrolytes  Recent Labs Lab 07/27/13 0400 07/28/13 0405  07/29/13 0400 07/29/13 1700 07/30/13 0415  07/31/13 0410  CALCIUM 7.0* 7.4*  < > 7.4* 7.1* 6.9* 7.3*  MG 1.7 1.7  --  1.7  --  1.9 1.7  PHOS 2.7 2.7  --   --   --   --  3.5  < > = values in this interval not displayed. Sepsis Markers  Recent Labs Lab 07/24/13 0900 07/25/13 1000  LATICACIDVEN 1.5 1.1   ABG  Recent Labs Lab 07/27/13 2341 07/29/13 1707 07/30/13 0407  PHART 7.353 7.420 7.498*  PCO2ART 42.8 45.0 38.4  PO2ART 61.0* 123.0* 76.2*   Liver Enzymes  Recent Labs Lab 07/26/13 0430 07/27/13 0400 07/30/13 0415  AST 130* 106* 47*  ALT 393* 237* 85*  ALKPHOS 168* 168* 143*  BILITOT 2.8* 2.6* 1.7*  ALBUMIN 1.5* 1.3* 1.2*   Cardiac Enzymes  Recent Labs Lab 07/24/13 1800  07/25/13 0330 07/26/13 0430  TROPONINI 1.54* 1.22* 0.74*   Glucose  Recent Labs Lab 07/30/13 0800 07/30/13 1149 07/30/13 1546 07/30/13 1933 07/30/13 2320 07/31/13 0341  GLUCAP 129* 170* 110* 135* 152* 133*    Imaging Dg Chest Portable 1 View  07/31/2013   CLINICAL DATA:  Respiratory failure.  EXAM: PORTABLE CHEST - 1 VIEW  COMPARISON:  One-view chest 07/30/2013.  FINDINGS: A left IJ line is stable in position. The patient has been extubated. The heart is enlarged. Atherosclerotic calcifications are again noted in the aortic arch. Bilateral pleural effusions and associated airspace disease is present. A diffuse interstitial pattern is unchanged. Airspace opacification at the level of the minor fissure is slightly more prominent.  IMPRESSION: 1. Slight increase an airspace opacification over the right midlung, likely in the superior segment of the right lower lobe. 2. Overall stable interstitial pattern, likely representing edema superimposed on chronic coarsening. 3. Status post extubation. 4. Persistent bilateral pleural effusions as the associated airspace disease, likely atelectasis.   Electronically Signed   By: Gennette Pac M.D.   On: 07/31/2013 07:57   Dg Chest Port 1 View  07/30/2013   CLINICAL DATA:  Respiratory failure and status post bowel resection for necrosis and right leg amputation.  EXAM: PORTABLE CHEST - 1 VIEW  COMPARISON:  07/29/2013  FINDINGS: Endotracheal tube remains with the tip approximately 5 cm above the carina. Lungs show improved aeration on the left with less prominent atelectasis/ infiltrates present. Airspace disease of the right lung is relatively stable. No pneumothorax or significant pleural effusions are seen. The heart size and mediastinal contours are stable.  IMPRESSION: Improved aeration of the left lung. Stable airspace disease of the right lung.   Electronically Signed   By: Irish Lack M.D.   On: 07/30/2013 08:07   Dg Chest Port 1  View  07/29/2013   CLINICAL DATA:  Evaluate endotracheal tube placement.  EXAM: PORTABLE CHEST - 1 VIEW  COMPARISON:  07/29/2013  FINDINGS: Endotracheal tube is 5.1 cm above the carina. Nasogastric tube extends into the abdomen but the tip is beyond the image. Jugular central venous catheter tip in the upper SVC region. Heart size is grossly normal. The thoracic aorta is heavily calcified. No change in the bilateral patchy airspace disease. Probable pleural fluid at the lung bases.  IMPRESSION: The endotracheal tube is appropriately position.  Minimal change in the bilateral parenchymal lung disease. Probable bilateral pleural effusions.  Atherosclerotic disease.   Electronically Signed   By: Richarda Overlie M.D.   On: 07/29/2013 13:02    ASSESSMENT / PLAN:  PULMONARY A: Acute resp failure - worsening hypoxia/ALI 12/11 -  extubated 12/14, back on vent post op R AKA G neg- aspiration pneumonia COPD with tobacco abuse hx P:   -scheduled BD's -prn xopenex -f/u CXR  -pulmonary hygiene - resume chest pt   CARDIOVASCULAR A:  Shock- presumed sepsis, SIRS - resolved CAD/ isch cardiomyopathy -EF 35% Vasculopathy -ischemic RLE > s/p R AKA 12/16 NSTEMI Volume overload/ pulm edema  A fib P:  -Even to negative fluid balance as tolerated -continue amiodarone   RENAL A:   Metabolic acidosis >> resolved. Hypokalemia P:   -f/u renal fx, urine outpt -replace electrolytes as needed  GASTROINTESTINAL A:   SBO/Ischemia s/p extensive SB resection Shock liver -LFTs much improved  H/o antral ulcers P:   -CCS following -continue TNA  -add clear liquid after extubation  HEMATOLOGIC A:   Thrombocytopenia Coagulopathy-  With low fibrinogen, low grade DIC picture likely related to ischemic leg >> should improve after amputation. P:   -scds  -Follow CBC  INFECTIOUS A:   E coli PNA. Peritonitis. P:   -continue vancomycin, zoysn, diflucan for now  ENDOCRINE A:   Hyperglycemia P:    -SSI  NEUROLOGIC A:  Pain control - much improved post AKA. P:   PRN fent for pain   Brett Canales Minor ACNP [facility] PCCM Pager [phone] till 3 pm If no answer page [phone] 07/31/2013, 8:27 AM  Reviewed above, examined pt, and agree with assessment/plan.  Seems to be making some improvement after AKA, but still needing significant support for oxygenation and still very weak.  Keep in ICU for today.  Updated family at bedside.  Coralyn Helling, MD [facility] Pulmonary/Critical Care 07/31/2013, 9:53 AM Pager:  [phone] After 3pm call: [phone]

## 2013-07-31 NOTE — Progress Notes (Signed)
NIV started with patient due to decreasing sats and increased WOB on NRBM.  Patient tolerating well.  Current settings 14/6, 60%.

## 2013-07-31 NOTE — Progress Notes (Signed)
Orthopedic Tech Progress Note Patient Details:  [name] 03-15-1935 [id number] Order completed by bio-tech [name], male   DOB: May 29, 1935, 78 y.o.   MRN: [id number]   [name] 07/31/2013, 5:41 PM

## 2013-07-31 NOTE — Progress Notes (Signed)
Patient instructed on Flutter valve. He performed with good effort. Non productive cough post exercises.

## 2013-07-31 NOTE — Progress Notes (Signed)
Orthopedic Tech Progress Note Patient Details:  [name] 1935-07-19 [id number] Called bio-tech for brace [name], male   DOB: 10-09-1934, 78 y.o.   MRN: [id number]   [name] 07/31/2013, 3:31 PM

## 2013-08-01 ENCOUNTER — Inpatient Hospital Stay (HOSPITAL_COMMUNITY): Payer: Medicare Other

## 2013-08-01 DIAGNOSIS — J155 Pneumonia due to Escherichia coli: Secondary | ICD-10-CM | POA: Diagnosis not present

## 2013-08-01 DIAGNOSIS — E43 Unspecified severe protein-calorie malnutrition: Secondary | ICD-10-CM

## 2013-08-01 LAB — GLUCOSE, CAPILLARY
Glucose-Capillary: 118 mg/dL — ABNORMAL HIGH (ref 70–99)
Glucose-Capillary: 121 mg/dL — ABNORMAL HIGH (ref 70–99)
Glucose-Capillary: 122 mg/dL — ABNORMAL HIGH (ref 70–99)
Glucose-Capillary: 131 mg/dL — ABNORMAL HIGH (ref 70–99)

## 2013-08-01 LAB — CBC
HCT: 24.1 % — ABNORMAL LOW (ref 39.0–52.0)
Hemoglobin: 8.1 g/dL — ABNORMAL LOW (ref 13.0–17.0)
MCH: 29.9 pg (ref 26.0–34.0)
MCHC: 33.6 g/dL (ref 30.0–36.0)
Platelets: 95 10*3/uL — ABNORMAL LOW (ref 150–400)
RDW: 18.6 % — ABNORMAL HIGH (ref 11.5–15.5)

## 2013-08-01 LAB — BASIC METABOLIC PANEL
BUN: 27 mg/dL — ABNORMAL HIGH (ref 6–23)
Calcium: 7.3 mg/dL — ABNORMAL LOW (ref 8.4–10.5)
GFR calc non Af Amer: >90 mL/min (ref >90)
Glucose, Bld: 123 mg/dL — ABNORMAL HIGH (ref 70–99)
Sodium: 134 mEq/L — ABNORMAL LOW (ref 135–145)

## 2013-08-01 MED ORDER — CLINIMIX E/DEXTROSE (5/15) 5 % IV SOLN
INTRAVENOUS | Status: DC
  Filled 2013-08-01: qty 2400

## 2013-08-01 MED ORDER — ADULT MULTIVITAMIN W/MINERALS CH
1.0000 | ORAL_TABLET | Freq: Every day | ORAL | Status: DC
  Filled 2013-08-01: qty 1

## 2013-08-01 MED ORDER — HEPARIN SODIUM (PORCINE) 5000 UNIT/ML IJ SOLN
5000.0000 [IU] | Freq: Three times a day (TID) | INTRAMUSCULAR | Status: DC
  Administered 2013-08-01 – 2013-08-09 (×24): 5000 [IU] via SUBCUTANEOUS
  Filled 2013-08-01 (×27): qty 1

## 2013-08-01 MED ORDER — PANTOPRAZOLE SODIUM 40 MG PO TBEC: 40.0000 mg | DELAYED_RELEASE_TABLET | Freq: Two times a day (BID) | ORAL | Status: DC

## 2013-08-01 MED ORDER — TRACE MINERALS CR-CU-F-FE-I-MN-MO-SE-ZN IV SOLN
INTRAVENOUS | Status: AC
  Administered 2013-08-01: 17:00:00 via INTRAVENOUS
  Filled 2013-08-01: qty 2400

## 2013-08-01 MED ORDER — FAT EMULSION 20 % IV EMUL
250.0000 mL | INTRAVENOUS | Status: AC
  Administered 2013-08-01: 250 mL via INTRAVENOUS
  Filled 2013-08-01: qty 250

## 2013-08-01 MED ORDER — INSULIN ASPART 100 UNIT/ML ~~LOC~~ SOLN
0.0000 [IU] | SUBCUTANEOUS | Status: DC
  Administered 2013-08-01 – 2013-08-03 (×9): 1 [IU] via SUBCUTANEOUS

## 2013-08-01 MED ORDER — FUROSEMIDE 10 MG/ML IJ SOLN
40.0000 mg | Freq: Three times a day (TID) | INTRAMUSCULAR | Status: AC
  Administered 2013-08-01 – 2013-08-02 (×3): 40 mg via INTRAVENOUS
  Filled 2013-08-01 (×2): qty 4

## 2013-08-01 MED ORDER — ARFORMOTEROL TARTRATE 15 MCG/2ML IN NEBU
15.0000 ug | INHALATION_SOLUTION | Freq: Two times a day (BID) | RESPIRATORY_TRACT | Status: DC
  Administered 2013-08-01 – 2013-08-09 (×14): 15 ug via RESPIRATORY_TRACT
  Filled 2013-08-01 (×21): qty 2

## 2013-08-01 MED ORDER — PANTOPRAZOLE SODIUM 40 MG IV SOLR
40.0000 mg | Freq: Two times a day (BID) | INTRAVENOUS | Status: DC
  Administered 2013-08-01 – 2013-08-04 (×8): 40 mg via INTRAVENOUS
  Filled 2013-08-01 (×8): qty 40

## 2013-08-01 MED ORDER — FAT EMULSION 20 % IV EMUL
250.0000 mL | INTRAVENOUS | Status: DC
  Filled 2013-08-01: qty 250

## 2013-08-01 MED ORDER — INSULIN ASPART 100 UNIT/ML ~~LOC~~ SOLN: 0.0000 [IU] | Freq: Four times a day (QID) | SUBCUTANEOUS | Status: DC

## 2013-08-01 NOTE — Progress Notes (Addendum)
PARENTERAL NUTRITION CONSULT NOTE  Pharmacy Consult:  TPN Indication: massive bowel resection  No Known Allergies  Patient Measurements: Height: 6' (182.9 cm) Weight: 185 lb 10 oz (84.2 kg) IBW/kg (Calculated) : 77.6  Vital Signs: Temp: 97.5 F (36.4 C) (12/19 0804) Temp src: Oral (12/19 0804) BP: 127/50 mmHg (12/19 0600) Pulse Rate: 93 (12/19 0700) Intake/Output from previous day: 12/18 0701 - 12/19 0700 In: 3570 [I.V.:460; IV Piggyback:700; TPN:2410] Out: 1735 [Urine:1735] Intake/Output from this shift:    Labs:  Recent Labs  07/30/13 0415 07/31/13 0410 08/01/13 0400  WBC 12.3* 11.4* 10.1  HGB 8.3* 8.0* 8.1*  HCT 24.5* 24.0* 24.1*  PLT 132* 77* 95*     Recent Labs  07/30/13 0415 07/31/13 0410 08/01/13 0400  NA 135 134* 134*  K 3.7 3.5 4.2  CL 102 100 101  CO2 28 30 28   GLUCOSE 144* 139* 123*  BUN 28* 27* 27*  CREATININE 0.75 0.70 0.64  CALCIUM 6.9* 7.3* 7.3*  MG 1.9 1.7  --   PHOS  --  3.5  --   PROT 3.8*  --   --   ALBUMIN 1.2*  --   --   AST 47*  --   --   ALT 85*  --   --   ALKPHOS 143*  --   --   BILITOT 1.7*  --   --    Estimated Creatinine Clearance: 86.2 ml/min (by C-G formula based on Cr of 0.64).    Recent Labs  07/31/13 2340 08/01/13 0419 08/01/13 0805  GLUCAP 131* 121* 121*     Insulin Requirements in the past 24 hours:  4 units SSI  Current Nutrition:  Full liquid diet ordered, only taking ice chips Clinimix E 5/15 at 100 ml/hr and lipid 20% at 10 ml/hr provides 2184 kcal and 120 grams of protein  Nutritional Goals:  2000-2200 kCal, 110-120 grams of protein per day  Admit: 18 YOM admitted with N/V, dehydration, abdominal pain and enteritis d/t norovirus. Found to have mesenteric ischemia and infarcted small bowel. Underwent small bowel resection-majority of jejunum and proximal ileum- currently has minimal amount of small bowel needed to survive. Patient is DNR but continuing full medical care.  GI: 12/7 EGD showed  changes suggestive of severe erosive ulcerative gastritis, duodenitis and jujinitis - 12/7 CT angiogram suggested diffuse calcifications of mesenteric circulation; 12/8 massive bowel resection - majority of jejunum and proximal ileum. 12/9 resection of terminal ileum. Two anastomoses to re-establish intestinal continuity. Pt pulled out NG tube on 12/15.  full diet ordered 12/18, per surgery he has had a couple of smears of stool and is only taking ice chips and meds PO. On BiPap mask alternating with NRB mask.  PPI IV. Endo: No hx DM - all CBGs < 150.  Will stop SSI and CBGs.  Lytes: mild hyponatremia, K  4.2 after 3 runs yesterday, given mag 1 gm yesterday for mag 1.7,  Renal: SCr 0.64 (stable), CrCL 86 ml/min,  Pulm: Hx COPD / tobacco abuse. Extubated 12/14 however re-intubated on 12/16 for surgery, now extubated again, on BiPap alternating w/ NRM mask, Xopenex neb Cards: Hx CAD (CABG '95 + PCI '10) / DL / HTN / PVD / Aflutter. R-AKA done on 12/16 d/t severe PVD. S/p shock -- pressors now weaned off.  amiodarone, intermittent lasix Hepatobil: Shocked liver resolving - LFTs/tbili trending down. TG down to 140, pre-albumin improved to 6.4, alb 1.2 Neuro: Hx CVA/depression. GCS 15 - no sedation.  Heme:  aPTT 45 seconds, PT 18, INR 1.53- improving. Patient has received vitamin K and FFP as reversal before surgeries. Hgb 8.1, plts 95. Does have ulcers and anticoagulation has been held in the past d/t GI bleed. Has low fibrinogen and high D-Dimer.  ID:  Vanc + Zosyn + Fluconazole for E.coli PNA + high risk of perforation, r/o bacterial translocation. Afebrile, WBC trending down Best Practices: MC, SCDs, PPI IV TPN Access: LIJ- triple lumen (placed 12/8) TPN day#:8 (12/10 >> current)    Plan - Continue Clinimix E 5/15 at 100 ml/hr + IVFE at 10 ml/hr -Daily multivitamin and trace elements, not able to take PO MVI or PPI yet per RN assessment -continue SSI - am bmet and magnesium - F/U PO intake to start  weaning TPN  [name], Pharm.D. [phone] 08/01/2013 9:19 AM

## 2013-08-01 NOTE — Progress Notes (Addendum)
NUTRITION FOLLOW UP  DOCUMENTATION CODES Per approved criteria  -Severe malnutrition in the context of chronic illness   Intervention:    TPN per pharmacy Ensure Complete po BID, each supplement provides 350 kcal and 13 grams of protein RD to follow for nutrition care plan  Nutrition Dx:   Inadequate oral intake now related to inability to eat as evidenced by NPO status, ongoing  Goal:   TPN to meet > 90% of estimated nutrition needs, met  Monitor:   TPN prescription, PO intake, weight, labs, I/O's  Assessment:   Patient with PMH of COPD, CVA and CAD; previous admitted with enteritis (thought to be Norovirus); presented again with ongoing nausea, occasional vomiting and one loose BM per day; + severe fatigue and abdominal pain  Patient s/p emergent procedure 12/8: EXPLORATORY LAPAROTOMY ENTRECTOMY (MAJORITY OF JEJUNUM & PROXIMAL ILEUM)  Patient s/p procedure 12/16: RIGHT ABOVE-KNEE AMPUTATION  Patient advanced to Full Liquids yesterday.  Currently on BiPAP.  Not eating.  No significant BM's (couple smears of stool).  Patient continues to receive TPN with Clinimix E 5/15 @ 100 ml/hr and lipids @ 10 ml/hr. Provides 2184 kcal and 120 grams protein per day. Meets 100% minimum estimated energy needs and 96% minimum estimated protein needs.  Patient with some signs of refeeding syndrome with TPN initiation.  Mg, Phos, K+ remain WNL.  Height: Ht Readings from Last 1 Encounters:  07/18/13 6' (1.829 m)    Weight Status:   Wt Readings from Last 1 Encounters:  08/01/13 185 lb 10 oz (84.2 kg)    Re-estimated needs:  Kcal: 2100-2300 Protein: 125-135 gm Fluid: per MD  Skin: Stage II pressure ulcer to sacrum  Diet Order: Full Liquid   Intake/Output Summary (Last 24 hours) at 08/01/13 1022 Last data filed at 08/01/13 0600  Gross per 24 hour  Intake   3180 ml  Output   1440 ml  Net   1740 ml    Labs:   Recent Labs Lab 07/27/13 0400 07/28/13 0405  07/29/13 0400   07/30/13 0415 07/31/13 0410 08/01/13 0400  NA 138 135  < > 136  < > 135 134* 134*  K 3.5 3.6  < > 3.4*  < > 3.7 3.5 4.2  CL 108 104  < > 102  < > 102 100 101  CO2 23 25  < > 29  < > 28 30 28   BUN 20 19  < > 20  < > 28* 27* 27*  CREATININE 0.73 0.73  < > 0.73  < > 0.75 0.70 0.64  CALCIUM 7.0* 7.4*  < > 7.4*  < > 6.9* 7.3* 7.3*  MG 1.7 1.7  --  1.7  --  1.9 1.7  --   PHOS 2.7 2.7  --   --   --   --  3.5  --   GLUCOSE 124* 148*  < > 131*  < > 144* 139* 123*  < > = values in this interval not displayed.  CBG (last 3)   Recent Labs  07/31/13 2340 08/01/13 0419 08/01/13 0805  GLUCAP 131* 121* 121*    Scheduled Meds: . amiodarone  200 mg Oral Daily  . antiseptic oral rinse  15 mL Mouth Rinse QID  . arformoterol  15 mcg Nebulization BID  . chlorhexidine  15 mL Mouth Rinse BID  . fluconazole (DIFLUCAN) IV  200 mg Intravenous Q24H  . furosemide  40 mg Intravenous Q8H  . heparin subcutaneous  5,000 Units  Subcutaneous Q8H  . insulin aspart  0-9 Units Subcutaneous Q6H  . pantoprazole (PROTONIX) IV  40 mg Intravenous Q12H  . piperacillin-tazobactam (ZOSYN)  IV  3.375 g Intravenous Q8H  . sodium chloride  10 mL Intravenous Q12H    Continuous Infusions: . sodium chloride 20 mL/hr at 07/31/13 2350  . Marland KitchenTPN (CLINIMIX-E) Adult 100 mL/hr at 07/31/13 1900   And  . fat emulsion 250 mL (07/31/13 1900)  . Marland KitchenTPN (CLINIMIX-E) Adult     And  . fat emulsion      Maureen Chatters, RD, LDN Pager #: [phone] After-Hours Pager #: [phone]

## 2013-08-01 NOTE — Progress Notes (Signed)
Patient states he doesn't feel comfortable enough to come off BiPAP at this time to do the flutter valve. Will attempt again at 1600.  Ancil Boozer, RRT, RCP

## 2013-08-01 NOTE — Progress Notes (Signed)
PT Cancellation Note  Patient Details Name: BRAIN HONEYCUTT MRN: [id number] DOB: April 07, 1935   Cancelled Treatment:    Reason Eval Not Completed: Medical issues which prohibited therapy. Pt remains on BiPap due to respiratory distress. RN reports pt not yet appropriate.   Jaynie Hitch 08/01/2013, 2:55 PM Pager [phone]

## 2013-08-01 NOTE — Progress Notes (Addendum)
[facility]  Subjective  - Alert.  Nods head in response to yes no questions.  Family at bed side.   Objective 120/53 87 97.5 F (36.4 C) (Oral) 30 95%  Intake/Output Summary (Last 24 hours) at 08/01/13 1202 Last data filed at 08/01/13 0600  Gross per 24 hour  Intake   2570 ml  Output   1270 ml  Net   1300 ml    Right AKA stump in clean and dry without erythema. Removed dressing and applied Biotech sleeve cover.  Assessment/Planning: POD# 3 right AKA S/P Right femoral thrombectomy of common femoral and profundus femoris artery, Dr. Arbie Cookey 07/25/13.  S/p Exploratory laparotomy with entrectomy (majority of jejunum and proximal ileum) Velora Heckler, MD - Primary Staples will remain for 4 weeks total.    Thomasena Edis, EMMA [facility] 08/01/2013 12:02 PM --  Laboratory Lab Results:  Recent Labs  07/31/13 0410 08/01/13 0400  WBC 11.4* 10.1  HGB 8.0* 8.1*  HCT 24.0* 24.1*  PLT 77* 95*   BMET  Recent Labs  07/31/13 0410 08/01/13 0400  NA 134* 134*  K 3.5 4.2  CL 100 101  CO2 30 28  GLUCOSE 139* 123*  BUN 27* 27*  CREATININE 0.70 0.64  CALCIUM 7.3* 7.3*    COAG Lab Results  Component Value Date   INR 1.53* 07/26/2013   INR 1.58* 07/25/2013   INR 1.59* 07/25/2013   No results found for this basename: PTT      I have examined the patient, reviewed and agree with above.  Haydyn Girvan, MD 08/02/2013 8:05 AM

## 2013-08-01 NOTE — Progress Notes (Signed)
3 Days Post-Op  Subjective: On BiPap, alternating with NRB mask.  [name] looks like [name]'s working to breath even with BIPAP.  [name] is very weak, not taking anything PO except meds and a few ice chips.  [name] has had a couple smears of stool, but nothing remarkable.  Objective: Vital signs in last 24 hours: Temp:  [97.2 F (36.2 C)-99.2 F (37.3 C)] 97.5 F (36.4 C) (12/19 0804) Pulse Rate:  [81-104] 93 (12/19 0700) Resp:  [22-35] 28 (12/19 0700) BP: (97-127)/(41-73) 127/50 mmHg (12/19 0600) SpO2:  [89 %-99 %] 96 % (12/19 0700) Arterial Line BP: (129-131)/(47) 129/47 mmHg (12/18 0815) FiO2 (%):  [50 %-100 %] 50 % (12/19 0600) Weight:  [84.2 kg (185 lb 10 oz)] 84.2 kg (185 lb 10 oz) (12/19 0433) Last BM Date: 07/30/13 Nothing PO, No BM recorded. Nutrition:  TNA/full liquids ordered. CXR shows worsening edema/CHF Afebrile, VSS, Labs OK Intake/Output from previous day: 12/18 0701 - 12/19 0700 In: 3570 [I.V.:460; IV Piggyback:700; TPN:2410] Out: 1735 [Urine:1735] Intake/Output this shift:    General appearance: alert, cooperative and looks fatigued and working to breath with BIpap Resp: few anterior rales,  GI: soft, nontender, few bowel sounds.  Incision is healing well, no distension. Extremities: [name] has generalized edema all extremities.    Lab Results:   Recent Labs  07/31/13 0410 08/01/13 0400  WBC 11.4* 10.1  HGB 8.0* 8.1*  HCT 24.0* 24.1*  PLT 77* 95*    BMET  Recent Labs  07/31/13 0410 08/01/13 0400  NA 134* 134*  K 3.5 4.2  CL 100 101  CO2 30 28  GLUCOSE 139* 123*  BUN 27* 27*  CREATININE 0.70 0.64  CALCIUM 7.3* 7.3*   PT/INR No results found for this basename: LABPROT, INR,  in the last 72 hours   Recent Labs Lab 07/26/13 0430 07/27/13 0400 07/30/13 0415  AST 130* 106* 47*  ALT 393* 237* 85*  ALKPHOS 168* 168* 143*  BILITOT 2.8* 2.6* 1.7*  PROT 3.6* 3.6* 3.8*  ALBUMIN 1.5* 1.3* 1.2*     Lipase     Component Value Date/Time   LIPASE 11  07/21/2013 1010     Studies/Results: Dg Chest Port 1 View  08/01/2013   CLINICAL DATA:  Respiratory failure, right lower extremity amputation and bowel resection.  EXAM: PORTABLE CHEST - 1 VIEW  COMPARISON:  07/31/2013  FINDINGS: Overall worsening pattern of interstitial edema/ CHF. No pneumothorax is identified. Central line positioning is stable.  IMPRESSION: Worsening interstitial edema/ CHF.   Electronically Signed   By: Irish Lack M.D.   On: 08/01/2013 07:57   Dg Chest Portable 1 View  07/31/2013   CLINICAL DATA:  Respiratory failure.  EXAM: PORTABLE CHEST - 1 VIEW  COMPARISON:  One-view chest 07/30/2013.  FINDINGS: A left IJ line is stable in position. The patient has been extubated. The heart is enlarged. Atherosclerotic calcifications are again noted in the aortic arch. Bilateral pleural effusions and associated airspace disease is present. A diffuse interstitial pattern is unchanged. Airspace opacification at the level of the minor fissure is slightly more prominent.  IMPRESSION: 1. Slight increase an airspace opacification over the right midlung, likely in the superior segment of the right lower lobe. 2. Overall stable interstitial pattern, likely representing edema superimposed on chronic coarsening. 3. Status post extubation. 4. Persistent bilateral pleural effusions as the associated airspace disease, likely atelectasis.   Electronically Signed   By: Gennette Pac M.D.   On: 07/31/2013 07:57  Medications: . amiodarone  200 mg Oral Daily  . antiseptic oral rinse  15 mL Mouth Rinse QID  . chlorhexidine  15 mL Mouth Rinse BID  . fluconazole (DIFLUCAN) IV  200 mg Intravenous Q24H  . insulin aspart  0-9 Units Subcutaneous Q4H  . levalbuterol  0.63 mg Nebulization Q6H WA  . pantoprazole (PROTONIX) IV  40 mg Intravenous Q12H  . piperacillin-tazobactam (ZOSYN)  IV  3.375 g Intravenous Q8H  . sodium chloride  10 mL Intravenous Q12H  . vancomycin  750 mg Intravenous Q12H     Assessment/Plan Readmitted 07/18/13, by medicine with nausea/vomiting and prior admit for enteritis  Ischemic and infarcted small bowel, with multiple organ failure.  1. S/p Exploratory laparotomy with entrectomy (majority of jejunum and proximal ileum) Velora Heckler, MD - Primary, * Larina Earthly, MD - Assisting 07/21/2013.  2. 1. Exploratory laparotomy. Massive entrectomy (majority of jejunum and proximal ileum), Dr. Gerrit Friends 07/22/13  3. 1. Exploratory laparotomy. Segmental small bowel resection terminal ileum.  Two primary anastomoses to reestablish intestinal continuity. Darnell Level 07/23/13.  4. Right femoral thrombectomy of common femoral and profundus femoris artery, Dr. Arbie Cookey 07/25/13.  5.  Right above-knee amputation, 07/29/2013 Nada Libman, MD. For Acutely ischemic RLE.  Septic shock VDRF/ongoing respiratory insuffiencey  Shock liver  ARF  Deconditioning and Malnutrition (last Prealbumin was 6.4.  Plan:  Continue current medical management.  [name] is on full liquids with a couple smears of stool, but nothing remarkable. No real nutritional PO intake, relying on TNA as primary nutrition source.  [name] currently does not appear to be on DVT chemoprophylaxis, [name] does have single SCD.    LOS: 14 days    [name] 08/01/2013

## 2013-08-01 NOTE — Progress Notes (Signed)
ANTIBIOTIC CONSULT NOTE - FOLLOW UP  Pharmacy Consult for Vanco/Zosyn/Fluconazole Indication: pneumonia, Bowel necrosis  No Known Allergies  Labs:  Recent Labs  07/30/13 0415 07/31/13 0410 08/01/13 0400  WBC 12.3* 11.4* 10.1  HGB 8.3* 8.0* 8.1*  PLT 132* 77* 95*  CREATININE 0.75 0.70 0.64   Estimated Creatinine Clearance: 86.2 ml/min (by C-G formula based on Cr of 0.64).  Recent Labs  07/29/13 0930  VANCOTROUGH 22.4*     Microbiology: Recent Results (from the past 720 hour(s))  MRSA PCR SCREENING     Status: None   Collection Time    07/06/13 10:25 PM      Result Value Range Status   MRSA by PCR NEGATIVE  NEGATIVE Final   Comment:            The GeneXpert MRSA Assay (FDA     approved for NASAL specimens     only), is one component of a     comprehensive MRSA colonization     surveillance program. It is not     intended to diagnose MRSA     infection nor to guide or     monitor treatment for     MRSA infections.  CLOSTRIDIUM DIFFICILE BY PCR     Status: None   Collection Time    07/08/13  4:55 PM      Result Value Range Status   C difficile by pcr NEGATIVE  NEGATIVE Final  STOOL CULTURE     Status: None   Collection Time    07/08/13  4:55 PM      Result Value Range Status   Specimen Description STOOL   Final   Special Requests Normal   Final   Culture     Final   Value: NO SALMONELLA, SHIGELLA, CAMPYLOBACTER, YERSINIA, OR E.COLI 0157:H7 ISOLATED     Performed at [facility]   Report Status 07/12/2013 FINAL   Final  CULTURE, BLOOD (ROUTINE X 2)     Status: None   Collection Time    07/21/13  7:45 AM      Result Value Range Status   Specimen Description BLOOD LEFT ARM   Final   Special Requests BOTTLES DRAWN AEROBIC ONLY 10CC   Final   Culture  Setup Time     Final   Value: 07/21/2013 13:39     Performed at [facility]   Culture     Final   Value: NO GROWTH 5 DAYS     Performed at [facility]   Report Status 07/27/2013  FINAL   Final  CULTURE, BLOOD (ROUTINE X 2)     Status: None   Collection Time    07/21/13  7:50 AM      Result Value Range Status   Specimen Description BLOOD LEFT ARM   Final   Special Requests BOTTLES DRAWN AEROBIC ONLY 6CC   Final   Culture  Setup Time     Final   Value: 07/21/2013 13:39     Performed at [facility]   Culture     Final   Value: NO GROWTH 5 DAYS     Performed at [facility]   Report Status 07/27/2013 FINAL   Final  CULTURE, RESPIRATORY (NON-EXPECTORATED)     Status: None   Collection Time    07/21/13  3:10 PM      Result Value Range Status   Specimen Description ENDOTRACHEAL   Final   Special Requests NONE  Final   Gram Stain     Final   Value: FEW WBC PRESENT,BOTH PMN AND MONONUCLEAR     NO SQUAMOUS EPITHELIAL CELLS SEEN     FEW GRAM POSITIVE RODS     RARE GRAM NEGATIVE RODS     RARE GRAM POSITIVE COCCI   Culture     Final   Value: MODERATE ESCHERICHIA COLI     Performed at [facility]   Report Status 07/24/2013 FINAL   Final   Organism ID, Bacteria ESCHERICHIA COLI   Final    Anti-infectives   Start     Dose/Rate Route Frequency Ordered Stop   07/29/13 2200  vancomycin (VANCOCIN) IVPB 750 mg/150 ml premix     750 mg 150 mL/hr over 60 Minutes Intravenous Every 12 hours 07/29/13 1014     07/25/13 1115  vancomycin (VANCOCIN) IVPB 1000 mg/200 mL premix     1,000 mg 200 mL/hr over 60 Minutes Intravenous To Surgery 07/25/13 1108 07/25/13 1111   07/22/13 2200  vancomycin (VANCOCIN) IVPB 1000 mg/200 mL premix  Status:  Discontinued     1,000 mg 200 mL/hr over 60 Minutes Intravenous Every 12 hours 07/22/13 2056 07/29/13 1014   07/21/13 1000  fluconazole (DIFLUCAN) IVPB 200 mg     200 mg 100 mL/hr over 60 Minutes Intravenous Every 24 hours 07/21/13 0944     07/21/13 0945  metroNIDAZOLE (FLAGYL) IVPB 500 mg  Status:  Discontinued     500 mg 100 mL/hr over 60 Minutes Intravenous Every 8 hours 07/21/13 0933 07/25/13 0918    07/21/13 0945  piperacillin-tazobactam (ZOSYN) IVPB 3.375 g     3.375 g 12.5 mL/hr over 240 Minutes Intravenous 3 times per day 07/21/13 0944     07/21/13 0800  vancomycin (VANCOCIN) IVPB 1000 mg/200 mL premix  Status:  Discontinued     1,000 mg 200 mL/hr over 60 Minutes Intravenous Every 12 hours 07/21/13 0626 07/22/13 1045   07/21/13 0615  ceFEPIme (MAXIPIME) 1 g in dextrose 5 % 50 mL IVPB  Status:  Discontinued     1 g 100 mL/hr over 30 Minutes Intravenous 3 times per day 07/21/13 1610 07/21/13 0919      Assessment: 78 y/o male admitted w/Atrial fibrillation, N/V, Dehydration, Hypokalemia, abd pain, Enteritis due to Norovirus  Anticoagulation: none, has SCD. Pt has been off due to several GIBs.   Infectious Disease: Strep Pneumo urinary antigen positive & Ecoli (aspiration) on Vanc/Zosyn/Flucon - suspected PNA and extensive bowel ischemia - at risk for perforation. Afebrile, WBC 10.1,  CrCl 86  12/8 > Vanc>>  12/9 VT = 18 on 1g q12 12/16 VT = 22.4 on 1 g iv Q 12 - dosage reduced to 750mg  IV q12  12/8 > Zosyn>> 12/8 > Fluconazole>> 12/6 > Metronidazole>> 12/12 12/6 > Cefepime >>12/8  12/8 + strep pneumo urinary antigen 12/8 BC x 2 >> Hegative 12/8 Resp>> Ecoli (Sens to CTX and Zosyn)  Cardiovascular: ICM, severe PAD. EF 35%. New NSTEMIVSS. Trop+ x4. Meds: Amio po, IV Lasix, (No ACEI due to h/o RAS) --12/12 s/p R LE endarterectomy (Dr Early) for ischemic RLE. Pt now considering R AKA vs BKA today 12/16  Endocrinology - No DM - CBG's <150 on SSI, TG 210.   Gastrointestinal / Nutrition - severe erosive and ulcerative gastritis/duodenitis/jejunitis - IV PPI q12. He has extensive bowel necrosis (s/p resection/anastomosis/abdominal wall closure), +TPN added. LFTs now trending down slowly (holding lipitor)(but still on amiodarone) --12/8 Exploratory laparotomy with entrectomy (majority  of jejunum and proximal ileum)  --12/9 Segmental small bowel resection terminal ileum Two  anastomoses to re-establish intestinal continuity   Nephrology - sCr 0.64 - stable  Pulmonary - COPD with h/o tobacco. Xopenex nebs, on BIPAP  Hematology / Oncology -thrombocytopenia, Plts 41 - no heparin on board- holding plavix. FFP given on 12/9. INR 1.6. DIC panel prelim: abnormal.  Best Practices SCD   Goal of Therapy:  Vancomycin trough level 15-20 mcg/ml  Plan:  -Continue vancomycin 750mg  IV q12 -Zosyn 3.375gm IV q8hr -Fluconazole 200mg  q24 Monitor renal function  Celedonio Miyamoto, PharmD, BCPS Clinical Pharmacist Pager [phone]   08/01/2013,9:20 AM

## 2013-08-01 NOTE — Progress Notes (Signed)
PULMONARY  / CRITICAL CARE MEDICINE  Name: [name] MRN: [id number] DOB: July 25, 1935    ADMISSION DATE:  07/18/2013 CONSULTATION DATE: 07/21/2013  REFERRING MD :  [facility] PRIMARY SERVICE: PCCM  CHIEF COMPLAINT:  ARF  BRIEF PATIENT DESCRIPTION:  78 yo with ischemic cardiomyopathy, severe PAD admitted with abdominal pain.  CT abdomen demonstrated extensive ischemic changes. Was urgently intubated for shock and respiratory failure and transferred to ICU. Extubated then back to OR for R AKA 12/16 and remains vented again post op.   SIGNIFICANT EVENTS / STUDIES:  12/8 intubated, shock, surgical evaluation, DNR established, Exploratory laparotomy with entrectomy (majority of jejunum and proximal ileum) 12/9 Segmental small bowel resection terminal ileum  Two anastomoses to re-establish intestinal continuity 12/11 requires higher PEEP/FIO2 12/12 s/p R LE endarterectomy (Dr Early)  12/16 To OR for Rt AKA, returned intubated with 80% fio2 12/18 intermittent NIMVS on 70% NRB  LINES / TUBES: 12- 8 OTT>>12/14, 12-16 intubated in or>> 12/17>>12-17 12-8 Rt fem cvl>>12-8 12-8 Lt IJ cvl>> R axillary aline 12/8 >> 12/18  CULTURES: 12-8 bc x 2>>ng 12-8 sputum>>e coli S to ceftx / zosyn  ANTIBIOTICS: 12-8 vanc>>12/19 12-8 diflucan>> 12-8 pip tazo>>  SUBJECTIVE:  On BiPAP.  VITAL SIGNS: Temp:  [97.2 F (36.2 C)-99.2 F (37.3 C)] 97.5 F (36.4 C) (12/19 0804) Pulse Rate:  [81-104] 93 (12/19 0700) Resp:  [22-35] 28 (12/19 0700) BP: (97-127)/(41-73) 127/50 mmHg (12/19 0600) SpO2:  [89 %-99 %] 99 % (12/19 0858) FiO2 (%):  [50 %-100 %] 50 % (12/19 0900) Weight:  [185 lb 10 oz (84.2 kg)] 185 lb 10 oz (84.2 kg) (12/19 0433) INTAKE / OUTPUT: Intake/Output     12/18 0701 - 12/19 0700 12/19 0701 - 12/20 0700   I.V. (mL/kg) 460 (5.5)    IV Piggyback 700    TPN 2410    Total Intake(mL/kg) 3570 (42.4)    Urine (mL/kg/hr) 1735 (0.9)    Total Output 1735     Net +1835            PHYSICAL  EXAMINATION: General: no distress Neuro:   Awake, alert, follows commands  HEENT: BiPAP mask in place Cardiovascular: regular Lungs: b/l rales Abdomen: soft, wound dressing clean Musculoskeletal:  1+ edema Lt leg, Rt leg AKA stump clean Skin: no rashes  LABS:  CBC  Recent Labs Lab 07/30/13 0415 07/31/13 0410 08/01/13 0400  WBC 12.3* 11.4* 10.1  HGB 8.3* 8.0* 8.1*  HCT 24.5* 24.0* 24.1*  PLT 132* 77* 95*   Coag's  Recent Labs Lab 07/25/13 1100 07/26/13 0430  APTT 42* 45*  INR 1.58* 1.53*   BMET  Recent Labs Lab 07/30/13 0415 07/31/13 0410 08/01/13 0400  NA 135 134* 134*  K 3.7 3.5 4.2  CL 102 100 101  CO2 28 30 28   BUN 28* 27* 27*  CREATININE 0.75 0.70 0.64  GLUCOSE 144* 139* 123*   Electrolytes  Recent Labs Lab 07/27/13 0400 07/28/13 0405  07/29/13 0400  07/30/13 0415 07/31/13 0410 08/01/13 0400  CALCIUM 7.0* 7.4*  < > 7.4*  < > 6.9* 7.3* 7.3*  MG 1.7 1.7  --  1.7  --  1.9 1.7  --   PHOS 2.7 2.7  --   --   --   --  3.5  --   < > = values in this interval not displayed.  Sepsis Markers  Recent Labs Lab 07/25/13 1000  LATICACIDVEN 1.1   ABG  Recent Labs Lab 07/27/13 2341  07/29/13 1707 07/30/13 0407  PHART 7.353 7.420 7.498*  PCO2ART 42.8 45.0 38.4  PO2ART 61.0* 123.0* 76.2*   Liver Enzymes  Recent Labs Lab 07/26/13 0430 07/27/13 0400 07/30/13 0415  AST 130* 106* 47*  ALT 393* 237* 85*  ALKPHOS 168* 168* 143*  BILITOT 2.8* 2.6* 1.7*  ALBUMIN 1.5* 1.3* 1.2*   Cardiac Enzymes  Recent Labs Lab 07/26/13 0430  TROPONINI 0.74*   Glucose  Recent Labs Lab 07/31/13 1205 07/31/13 1637 07/31/13 1957 07/31/13 2340 08/01/13 0419 08/01/13 0805  GLUCAP 147* 123* 125* 131* 121* 121*    Imaging Dg Chest Port 1 View  08/01/2013   CLINICAL DATA:  Respiratory failure, right lower extremity amputation and bowel resection.  EXAM: PORTABLE CHEST - 1 VIEW  COMPARISON:  07/31/2013  FINDINGS: Overall worsening pattern of  interstitial edema/ CHF. No pneumothorax is identified. Central line positioning is stable.  IMPRESSION: Worsening interstitial edema/ CHF.   Electronically Signed   By: Irish Lack M.D.   On: 08/01/2013 07:57   Dg Chest Portable 1 View  07/31/2013   CLINICAL DATA:  Respiratory failure.  EXAM: PORTABLE CHEST - 1 VIEW  COMPARISON:  One-view chest 07/30/2013.  FINDINGS: A left IJ line is stable in position. The patient has been extubated. The heart is enlarged. Atherosclerotic calcifications are again noted in the aortic arch. Bilateral pleural effusions and associated airspace disease is present. A diffuse interstitial pattern is unchanged. Airspace opacification at the level of the minor fissure is slightly more prominent.  IMPRESSION: 1. Slight increase an airspace opacification over the right midlung, likely in the superior segment of the right lower lobe. 2. Overall stable interstitial pattern, likely representing edema superimposed on chronic coarsening. 3. Status post extubation. 4. Persistent bilateral pleural effusions as the associated airspace disease, likely atelectasis.   Electronically Signed   By: Gennette Pac M.D.   On: 07/31/2013 07:57    ASSESSMENT / PLAN:  PULMONARY A: Acute respiratory failure in setting of ischemic bowel.  Required intubation for Rt AKA 12/14.  Complicated by HCAP. Hx of COPD. Tobacco abuse. P:   -scheduled BD's -change to brovana with prn xopenex -f/u CXR  -d/c chest percussions 12/19 -continue IS, flutter valve -BiPAP prn  CARDIOVASCULAR A:  Shock- presumed sepsis, SIRS - resolved CAD/ isch cardiomyopathy -EF 35% Vasculopathy -ischemic RLE > s/p R AKA 12/16 NSTEMI Volume overload/ pulm edema  A fib P:  -Negative fluid balance as tolerated >> lasix 40 mg q8h x 3 doses on 12/19 -continue amiodarone   RENAL A:   Metabolic acidosis >> resolved. Hypokalemia, hypomagnesemia. P:   -f/u renal fx, urine outpt -replace electrolytes as  needed  GASTROINTESTINAL A:   SBO/Ischemia s/p extensive SB resection Shock liver -LFTs much improved  H/o antral ulcers P:   -CCS following -continue TNA  -full liquid diet >> advance per CCS  HEMATOLOGIC A:   Thrombocytopenia. Coagulopathy-  With low fibrinogen, low grade DIC picture likely related to ischemic leg >> improved after amputation. P:   -Add SQ heparin 12/19 for DVT prevention -Follow CBC  INFECTIOUS A:   E coli PNA. Peritonitis. P:   -day 13 of zosyn, diflucan -will d/c vancomycin 12/19  ENDOCRINE A:   Hyperglycemia. P:   -SSI  NEUROLOGIC A:  Pain control - much improved post AKA. P:   PRN fent for pain   CC time 35 minutes.  Updated family at bedside.  Coralyn Helling, MD [facility] Pulmonary/Critical Care 08/01/2013, 9:27 AM Pager:  [phone] After 3pm call: [phone]

## 2013-08-01 NOTE — Progress Notes (Signed)
On BiPAP. Abdomen soft.  Patient examined and I agree with the assessment and plan  Violeta Gelinas, MD, MPH, FACS Pager: [phone]  08/01/2013 3:52 PM

## 2013-08-01 NOTE — Progress Notes (Signed)
Patient placed on NRB by RT. Called back to put BiPAP back on patient as patient said that mask wasn't working for his breathing, sats 91%. Patient placed back on BiPAP with sats back up to 96% upon leaving.

## 2013-08-01 NOTE — Progress Notes (Signed)
Able to tolerate non-rebreather mask after diuresis. Current urine output for today is .

## 2013-08-02 ENCOUNTER — Inpatient Hospital Stay (HOSPITAL_COMMUNITY): Payer: Medicare Other

## 2013-08-02 DIAGNOSIS — I219 Acute myocardial infarction, unspecified: Secondary | ICD-10-CM

## 2013-08-02 LAB — COMPREHENSIVE METABOLIC PANEL
ALT: 47 U/L (ref 0–53)
AST: 36 U/L (ref 0–37)
Alkaline Phosphatase: 172 U/L — ABNORMAL HIGH (ref 39–117)
BUN: 29 mg/dL — ABNORMAL HIGH (ref 6–23)
CO2: 31 mEq/L (ref 19–32)
Calcium: 7.7 mg/dL — ABNORMAL LOW (ref 8.4–10.5)
Chloride: 98 mEq/L (ref 96–112)
GFR calc Af Amer: >90 mL/min (ref >90)
GFR calc non Af Amer: 88 mL/min — ABNORMAL LOW (ref >90)
Glucose, Bld: 115 mg/dL — ABNORMAL HIGH (ref 70–99)
Potassium: 3.7 mEq/L (ref 3.5–5.1)
Sodium: 133 mEq/L — ABNORMAL LOW (ref 135–145)
Total Bilirubin: 2 mg/dL — ABNORMAL HIGH (ref 0.3–1.2)
Total Protein: 4.8 g/dL — ABNORMAL LOW (ref 6.0–8.3)

## 2013-08-02 LAB — CBC
Hemoglobin: 8.4 g/dL — ABNORMAL LOW (ref 13.0–17.0)
MCH: 29.5 pg (ref 26.0–34.0)
MCHC: 33.3 g/dL (ref 30.0–36.0)
Platelets: 105 10*3/uL — ABNORMAL LOW (ref 150–400)
RDW: 18.4 % — ABNORMAL HIGH (ref 11.5–15.5)
WBC: 8.1 10*3/uL (ref 4.0–10.5)

## 2013-08-02 LAB — GLUCOSE, CAPILLARY
Glucose-Capillary: 138 mg/dL — ABNORMAL HIGH (ref 70–99)
Glucose-Capillary: 123 mg/dL — ABNORMAL HIGH (ref 70–99)
Glucose-Capillary: 121 mg/dL — ABNORMAL HIGH (ref 70–99)
Glucose-Capillary: 130 mg/dL — ABNORMAL HIGH (ref 70–99)

## 2013-08-02 MED ORDER — TRACE MINERALS CR-CU-F-FE-I-MN-MO-SE-ZN IV SOLN
INTRAVENOUS | Status: AC
  Administered 2013-08-02: 18:00:00 via INTRAVENOUS
  Filled 2013-08-02: qty 2400

## 2013-08-02 MED ORDER — MAGNESIUM SULFATE 40 MG/ML IJ SOLN
2.0000 g | Freq: Once | INTRAMUSCULAR | Status: AC
  Administered 2013-08-02: 2 g via INTRAVENOUS
  Filled 2013-08-02: qty 50

## 2013-08-02 MED ORDER — FAT EMULSION 20 % IV EMUL
250.0000 mL | INTRAVENOUS | Status: AC
  Administered 2013-08-02: 250 mL via INTRAVENOUS
  Filled 2013-08-02: qty 250

## 2013-08-02 NOTE — Progress Notes (Signed)
[name], male   DOB: 12-31-1934, 78 y.o.   MRN: [id number] Alert oriented extubated and comfortable. Denies right amputation pain. Healing quite nicely. Reports that he is not hungry.

## 2013-08-02 NOTE — Progress Notes (Signed)
Rehab Admissions Coordinator Note:  Patient was screened by [name] for appropriateness for an Inpatient Acute Rehab Consult.  At this time, we are recommending Inpatient Rehab consult.  [name] 08/02/2013, 10:16 PM  I can be reached at [phone].

## 2013-08-02 NOTE — Progress Notes (Signed)
4 Days Post-Op  Subjective: Denies SOB this morning  Tolerating po  Objective: Vital signs in last 24 hours: Temp:  [97.2 F (36.2 C)-98.4 F (36.9 C)] 97.2 F (36.2 C) (12/20 0749) Pulse Rate:  [78-111] 103 (12/20 0800) Resp:  [16-33] 23 (12/20 0800) BP: (113-147)/(48-96) 122/57 mmHg (12/20 0800) SpO2:  [93 %-100 %] 100 % (12/20 0800) FiO2 (%):  [40 %-100 %] 100 % (12/20 0700) Weight:  [171 lb 11.8 oz (77.9 kg)] 171 lb 11.8 oz (77.9 kg) (12/20 0447) Last BM Date: 08/02/13  Intake/Output from previous day: 12/19 0701 - 12/20 0700 In: 3370 [I.V.:480; IV Piggyback:250; TPN:2640] Out: 6880 [Urine:6880] Intake/Output this shift: Total I/O In: 130 [I.V.:20; TPN:110] Out: 925 [Urine:925]  Abdomen soft, incision healing well, non distended, minimally tender  Lab Results:   Recent Labs  08/01/13 0400 08/02/13 0415  WBC 10.1 8.1  HGB 8.1* 8.4*  HCT 24.1* 25.2*  PLT 95* 105*   BMET  Recent Labs  08/01/13 0400 08/02/13 0415  NA 134* 133*  K 4.2 3.7  CL 101 98  CO2 28 31  GLUCOSE 123* 115*  BUN 27* 29*  CREATININE 0.64 0.72  CALCIUM 7.3* 7.7*   PT/INR No results found for this basename: LABPROT, INR,  in the last 72 hours ABG No results found for this basename: PHART, PCO2, PO2, HCO3,  in the last 72 hours  Studies/Results: Dg Chest Port 1 View  08/02/2013   CLINICAL DATA:  Pneumonia.  CHF.  EXAM: PORTABLE CHEST - 1 VIEW  COMPARISON:  08/01/2013.  FINDINGS: Left IJ central line is present with the tip in the upper SVC. Position is unchanged compared to yesterday. Monitoring leads project over the chest. The appearance of the lungs is slightly worse with interstitial and airspace opacities and more focal consolidation in the right midlung which was present on prior exams consistent with scarring. Cardiopericardial silhouette remains enlarged.  IMPRESSION: 1. Stable support apparatus. 2. Slight interval increase in interstitial and airspace opacities likely  representing pulmonary edema and CHF.   Electronically Signed   By: Andreas Newport M.D.   On: 08/02/2013 07:38   Dg Chest Port 1 View  08/01/2013   CLINICAL DATA:  Respiratory failure, right lower extremity amputation and bowel resection.  EXAM: PORTABLE CHEST - 1 VIEW  COMPARISON:  07/31/2013  FINDINGS: Overall worsening pattern of interstitial edema/ CHF. No pneumothorax is identified. Central line positioning is stable.  IMPRESSION: Worsening interstitial edema/ CHF.   Electronically Signed   By: Irish Lack M.D.   On: 08/01/2013 07:57    Anti-infectives: Anti-infectives   Start     Dose/Rate Route Frequency Ordered Stop   07/29/13 2200  vancomycin (VANCOCIN) IVPB 750 mg/150 ml premix  Status:  Discontinued     750 mg 150 mL/hr over 60 Minutes Intravenous Every 12 hours 07/29/13 1014 08/01/13 0941   07/25/13 1115  vancomycin (VANCOCIN) IVPB 1000 mg/200 mL premix     1,000 mg 200 mL/hr over 60 Minutes Intravenous To Surgery 07/25/13 1108 07/25/13 1111   07/22/13 2200  vancomycin (VANCOCIN) IVPB 1000 mg/200 mL premix  Status:  Discontinued     1,000 mg 200 mL/hr over 60 Minutes Intravenous Every 12 hours 07/22/13 2056 07/29/13 1014   07/21/13 1000  fluconazole (DIFLUCAN) IVPB 200 mg     200 mg 100 mL/hr over 60 Minutes Intravenous Every 24 hours 07/21/13 0944     07/21/13 0945  metroNIDAZOLE (FLAGYL) IVPB 500 mg  Status:  Discontinued  500 mg 100 mL/hr over 60 Minutes Intravenous Every 8 hours 07/21/13 0933 07/25/13 0918   07/21/13 0945  piperacillin-tazobactam (ZOSYN) IVPB 3.375 g     3.375 g 12.5 mL/hr over 240 Minutes Intravenous 3 times per day 07/21/13 0944     07/21/13 0800  vancomycin (VANCOCIN) IVPB 1000 mg/200 mL premix  Status:  Discontinued     1,000 mg 200 mL/hr over 60 Minutes Intravenous Every 12 hours 07/21/13 0626 07/22/13 1045   07/21/13 0615  ceFEPIme (MAXIPIME) 1 g in dextrose 5 % 50 mL IVPB  Status:  Discontinued     1 g 100 mL/hr over 30 Minutes  Intravenous 3 times per day 07/21/13 1308 07/21/13 0919      Assessment/Plan: s/p Procedure(s): AMPUTATION ABOVE KNEE (Right)  Continuing current diet  LOS: 15 days    [name] 08/02/2013

## 2013-08-02 NOTE — Progress Notes (Signed)
PARENTERAL NUTRITION CONSULT NOTE  Pharmacy Consult:  TPN Indication: massive bowel resection  No Known Allergies  Patient Measurements: Height: 6' (182.9 cm) Weight: 171 lb 11.8 oz (77.9 kg) IBW/kg (Calculated) : 77.6  Vital Signs: Temp: 97.2 F (36.2 C) (12/20 0749) Temp src: Oral (12/20 0749) BP: 122/57 mmHg (12/20 0800) Pulse Rate: 103 (12/20 0800) Intake/Output from previous day: 12/19 0701 - 12/20 0700 In: 3370 [I.V.:480; IV Piggyback:250; TPN:2640] Out: 6880 [Urine:6880] Intake/Output from this shift: Total I/O In: 130 [I.V.:20; TPN:110] Out: 925 [Urine:925]  Labs:  Recent Labs  07/31/13 0410 08/01/13 0400 08/02/13 0415  WBC 11.4* 10.1 8.1  HGB 8.0* 8.1* 8.4*  HCT 24.0* 24.1* 25.2*  PLT 77* 95* 105*     Recent Labs  07/31/13 0410 08/01/13 0400 08/02/13 0415  NA 134* 134* 133*  K 3.5 4.2 3.7  CL 100 101 98  CO2 30 28 31   GLUCOSE 139* 123* 115*  BUN 27* 27* 29*  CREATININE 0.70 0.64 0.72  CALCIUM 7.3* 7.3* 7.7*  MG 1.7  --  1.8  PHOS 3.5  --   --   PROT  --   --  4.8*  ALBUMIN  --   --  1.3*  AST  --   --  36  ALT  --   --  47  ALKPHOS  --   --  172*  BILITOT  --   --  2.0*   Estimated Creatinine Clearance: 86.2 ml/min (by C-G formula based on Cr of 0.72).    Recent Labs  08/01/13 2015 08/02/13 0028 08/02/13 0408  GLUCAP 122* 138* 123*     Insulin Requirements in the past 24 hours:  4 units SSI  Admit: [name] admitted with N/V, dehydration, abdominal pain and enteritis d/t norovirus. Found to have mesenteric ischemia and infarcted small bowel. Underwent small bowel resection-majority of jejunum and proximal ileum- currently has minimal amount of small bowel needed to survive.  Patient was started on a full liquid diet but he ate minimally.  RN reported patient has no interest in the diet; no hindrance.  GI: 12/7 EGD showed changes suggestive of severe erosive ulcerative gastritis, duodenitis and jujinitis - 12/7 CT angiogram  suggested diffuse calcifications of mesenteric circulation; 12/8 massive bowel resection - majority of jejunum and proximal ileum. 12/9 resection of terminal ileum. Two anastomoses to re-establish intestinal continuity. Pt pulled out NG tube on 12/15.  Full diet ordered 12/18 but only taking ice chips and meds PO.  PPI IV. Endo: No hx DM - all CBGs < 150 Lytes: mild hyponatremia, others WNL Renal: SCr 0.72 (stable), CrCL 86 ml/min, excellent UOP on Lasix Pulm: Hx COPD / tobacco abuse. Extubated 12/14, re-intubated on 12/16 for surgery, now extubated again, BiPAP/NRM/PRM - Xopenex, Brovana Cards: Hx CAD (CABG '95 + PCI '10) / DL / HTN / PVD / Aflutter. R-AKA done on 12/16 d/t severe PVD. S/p shock - BP normal, ST - on amiodarone, intermittent lasix Hepatobil: Shocked liver resolving - AST/ALT normalized, alk phos/tbili trending up slightly, TG down to 140, pre-albumin improved to 6.4 Neuro: Hx CVA/depression. GCS 15 - no sedation.  Heme: aPTT 45 seconds, PT 18, INR 1.53- improving. Patient has received vitamin K and FFP as reversal before surgeries. Hgb 8.1, plts 95. Does have ulcers and anticoagulation has been held in the past d/t GI bleed. Has low fibrinogen and high D-Dimer.  ID:  Vanc + Zosyn + Fluconazole for E.coli PNA + high risk of perforation, r/o  bacterial translocation. Afebrile, WBC WNL Best Practices: heparin SQ, MC, SCDs, PPI IV TPN Access: LIJ- triple lumen (placed 12/8) TPN day#: 9 (12/10 >> current)  Current Nutrition:  Full liquid diet ordered, only taking ice chips Clinimix E 5/15 at 100 ml/hr and lipid 20% at 10 ml/hr provides 2184 kcal and 120 grams of protein  Nutritional Goals:  2000-2200 kCal, 110-120 grams of protein per day    Plan - Continue Clinimix E 5/15 at 100 ml/hr + IVFE at 10 ml/hr - Daily multivitamin and trace elements - F/U PO intake to start weaning TPN    [name], PharmD, BCPS Pager:  [phone] 08/02/2013, 8:39 AM

## 2013-08-02 NOTE — Progress Notes (Signed)
PULMONARY  / CRITICAL CARE MEDICINE  Name: [name] MRN: [id number] DOB: [birth year]    ADMISSION DATE:  07/18/2013 CONSULTATION DATE: 07/21/2013  REFERRING MD :  [facility] PRIMARY SERVICE: PCCM  CHIEF COMPLAINT:  ARF  BRIEF PATIENT DESCRIPTION:  78 yo with ischemic cardiomyopathy, severe PAD admitted with abdominal pain.  CT abdomen demonstrated extensive ischemic changes. Was urgently intubated for shock and respiratory failure and transferred to ICU. Extubated then back to OR for R AKA 12/16 and remains vented again post op.    LINES / TUBES: 12- 8 OTT>>12/14, 12-16 intubated in or>> 12/17>>12-17 12-8 Rt fem cvl>>12-8 12-8 Lt IJ cvl>> R axillary aline 12/8 >> 12/18  CULTURES: 12-8 bc x 2>>ng 12-8 sputum>>e coli S to ceftx / zosyn  ANTIBIOTICS: 12-8 vanc>>12/19 12-8 diflucan>> 12-8 pip tazo>>   SIGNIFICANT EVENTS / STUDIES:  12/8 intubated, shock, surgical evaluation, DNR established, Exploratory laparotomy with entrectomy (majority of jejunum and proximal ileum) 12/9 Segmental small bowel resection terminal ileum  Two anastomoses to re-establish intestinal continuity 12/11 requires higher PEEP/FIO2 12/12 s/p R LE endarterectomy (Dr Early)  12/16 To OR for Rt AKA, returned intubated with 80% fio2 12/18 intermittent NIMVS on 70% NRB   SUBJECTIVE/OVERNIGHT/INTERVAL HX 08/02/13: using bipap prn self directed for resp comfort. Good diuresis with lasix but RN says still sounds wet. Still hard to wean off NRB. Otherwise ok. Wants to go home   VITAL SIGNS: Temp:  [97.2 F (36.2 C)-98.4 F (36.9 C)] 97.6 F (36.4 C) (12/20 1128) Pulse Rate:  [78-111] 99 (12/20 1300) Resp:  [16-33] 31 (12/20 1300) BP: (92-147)/(48-96) 123/52 mmHg (12/20 1300) SpO2:  [94 %-100 %] 96 % (12/20 1300) FiO2 (%):  [40 %-100 %] 40 % (12/20 1300) Weight:  [77.9 kg (171 lb 11.8 oz)] 77.9 kg (171 lb 11.8 oz) (12/20 0447) INTAKE / OUTPUT: Intake/Output     12/19 0701 - 12/20 0700 12/20 0701 - 12/21  0700   P.O.  15   I.V. (mL/kg) 480 (6.2) 100 (1.3)   IV Piggyback 250 100   TPN 2640 660   Total Intake(mL/kg) 3370 (43.3) 875 (11.2)   Urine (mL/kg/hr) 6880 (3.7) 1925 (3.5)   Total Output 6880 1925   Net -3510 -1050        Stool Occurrence 1 x      PHYSICAL EXAMINATION: General: no distress Neuro:   Awake, alert, follows commands  HEENT: BiPAP mask in place Cardiovascular: regular Lungs: b/l rales Abdomen: soft, wound dressing clean Musculoskeletal:  1+ edema Lt leg, Rt leg AKA stump clean Skin: no rashes  LABS:  PULMONARY  Recent Labs Lab 07/27/13 2341 07/29/13 1707 07/30/13 0407  PHART 7.353 7.420 7.498*  PCO2ART 42.8 45.0 38.4  PO2ART 61.0* 123.0* 76.2*  HCO3 23.9 28.1* 29.6*  TCO2 25 29 30.7  O2SAT 90.0 98.0 96.4    CBC  Recent Labs Lab 07/31/13 0410 08/01/13 0400 08/02/13 0415  HGB 8.0* 8.1* 8.4*  HCT 24.0* 24.1* 25.2*  WBC 11.4* 10.1 8.1  PLT 77* 95* 105*    COAGULATION No results found for this basename: INR,  in the last 168 hours  CARDIAC  No results found for this basename: TROPONINI,  in the last 168 hours No results found for this basename: PROBNP,  in the last 168 hours   CHEMISTRY  Recent Labs Lab 07/27/13 0400 07/28/13 0405  07/29/13 0400 07/29/13 1700 07/30/13 0415 07/31/13 0410 08/01/13 0400 08/02/13 0415  NA 138 135  < > 136 135  135 134* 134* 133*  K 3.5 3.6  < > 3.4* 3.7 3.7 3.5 4.2 3.7  CL 108 104  < > 102 102 102 100 101 98  CO2 23 25  < > 29 30 28 30 28 31   GLUCOSE 124* 148*  < > 131* 145* 144* 139* 123* 115*  BUN 20 19  < > 20 24* 28* 27* 27* 29*  CREATININE 0.73 0.73  < > 0.73 0.73 0.75 0.70 0.64 0.72  CALCIUM 7.0* 7.4*  < > 7.4* 7.1* 6.9* 7.3* 7.3* 7.7*  MG 1.7 1.7  --  1.7  --  1.9 1.7  --  1.8  PHOS 2.7 2.7  --   --   --   --  3.5  --   --   < > = values in this interval not displayed. Estimated Creatinine Clearance: 86.2 ml/min (by C-G formula based on Cr of 0.72).   LIVER  Recent Labs Lab  07/27/13 0400 07/30/13 0415 08/02/13 0415  AST 106* 47* 36  ALT 237* 85* 47  ALKPHOS 168* 143* 172*  BILITOT 2.6* 1.7* 2.0*  PROT 3.6* 3.8* 4.8*  ALBUMIN 1.3* 1.2* 1.3*     INFECTIOUS No results found for this basename: LATICACIDVEN, PROCALCITON,  in the last 168 hours   ENDOCRINE CBG (last 3)   Recent Labs  08/02/13 0028 08/02/13 0408 08/02/13 0747  GLUCAP 138* 123* 121*         IMAGING x48h  Dg Chest Port 1 View  08/02/2013   CLINICAL DATA:  Pneumonia.  CHF.  EXAM: PORTABLE CHEST - 1 VIEW  COMPARISON:  08/01/2013.  FINDINGS: Left IJ central line is present with the tip in the upper SVC. Position is unchanged compared to yesterday. Monitoring leads project over the chest. The appearance of the lungs is slightly worse with interstitial and airspace opacities and more focal consolidation in the right midlung which was present on prior exams consistent with scarring. Cardiopericardial silhouette remains enlarged.  IMPRESSION: 1. Stable support apparatus. 2. Slight interval increase in interstitial and airspace opacities likely representing pulmonary edema and CHF.   Electronically Signed   By: Andreas Newport M.D.   On: 08/02/2013 07:38   Dg Chest Port 1 View  08/01/2013   CLINICAL DATA:  Respiratory failure, right lower extremity amputation and bowel resection.  EXAM: PORTABLE CHEST - 1 VIEW  COMPARISON:  07/31/2013  FINDINGS: Overall worsening pattern of interstitial edema/ CHF. No pneumothorax is identified. Central line positioning is stable.  IMPRESSION: Worsening interstitial edema/ CHF.   Electronically Signed   By: Irish Lack M.D.   On: 08/01/2013 07:57     ASSESSMENT / PLAN:  PULMONARY A: Acute respiratory failure in setting of ischemic bowel.  Required intubation for Rt AKA 12/14.  Complicated by HCAP. Hx of COPD. Tobacco abuse.  08/02/13 - still h ypoxemic despote 3.8L negative x 7 days. CXR with ALI - ? amio lung tox. On prn bipap P:   - check  ESR -scheduled BD's -change to brovana with prn xopenex -f/u CXR  -d/c chest percussions 12/19 -continue IS, flutter valve -BiPAP prn  CARDIOVASCULAR A:  Shock- presumed sepsis, SIRS - resolved CAD/ isch cardiomyopathy -EF 35% Vasculopathy -ischemic RLE > s/p R AKA 12/16 NSTEMI Volume overload/ pulm edema  A fib  P:  -Negative fluid balance as tolerated >> lasix 40 mg q8h x 3 doses on 12/19; to continue 08/02/13 -continue amiodarone   RENAL A:   Metabolic acidosis >>  resolved. Hypokalemia, hypomagnesemia. P:   - replete k and  mag -f/u renal fx, urine outpt -replace electrolytes as needed  GASTROINTESTINAL A:   SBO/Ischemia s/p extensive SB resection Shock liver -LFTs much improved  H/o antral ulcers P:   -CCS following -continue TNA  -full liquid diet >> advance per CCS  HEMATOLOGIC A:   Thrombocytopenia. Coagulopathy-  With low fibrinogen, low grade DIC picture likely related to ischemic leg >> improved after amputation. P:   -SQ heparin since 12/19 for DVT prevention -Follow CBC  INFECTIOUS A:   E coli PNA. Peritonitis. P:   -day 14 of zosyn, diflucan   ENDOCRINE A:   Hyperglycemia. P:   -SSI  NEUROLOGIC A:  Pain control - much improved post AKA. P:   PRN fent for pain  GLOBAL 08/02/13 - move to SDU 08/03/13   The patient is critically ill with multiple organ systems failure and requires high complexity decision making for assessment and support, frequent evaluation and titration of therapies, application of advanced monitoring technologies and extensive interpretation of multiple databases.   Critical Care Time devoted to patient care services described in this note is  35  Minutes.  Dr. Kalman Shan, M.D., [facility].C.P Pulmonary and Critical Care Medicine Staff Physician [facility] [place] [facility] Pager: [phone], If no answer or between  15:00h - 7:00h: call [phone]  08/02/2013 2:18  PM

## 2013-08-02 NOTE — Progress Notes (Signed)
Pt's urine output over 7 hours following the 2200 dose of Lasix was 2750 cc.  Alfonso Ellis, RN

## 2013-08-02 NOTE — Evaluation (Signed)
Physical Therapy Evaluation Patient Details Name: [name] MRN: [id number] DOB: [birth year] Today's Date: 08/02/2013 Time: 4098-1191 PT Time Calculation (min): 22 min  PT Assessment / Plan / Recommendation History of Present Illness  78 yo with ischemic cardiomyopathy, severe PAD admitted with abdominal pain.  CT abdomen demonstrated extensive ischemic changes. Was urgently intubated for shock and respiratory failure and transferred to ICU. Extubated then back to OR for R AKA 12/16 and remains vented again post op.   Clinical Impression  Patient demonstrates deficits in functional mobility as indicated below. Patient will benefit from acute PT to address deficits and maximize function. Recommend CIR upon discharge.  Will see as indicated and progress activity as tolerated.    PT Assessment  Patient needs continued PT services    Follow Up Recommendations  CIR;Supervision/Assistance - 24 hour    Does the patient have the potential to tolerate intense rehabilitation      Barriers to Discharge        Equipment Recommendations       Recommendations for Other Services Rehab consult;OT consult   Frequency Min 3X/week    Precautions / Restrictions Precautions Precautions: Fall Precaution Comments: monitor 02 sats   Pertinent Vitals/Pain No pain at this time, patient HR 128 while seated EOB, pt O2 >93% on partial rebreather 15 L.      Mobility  Bed Mobility Bed Mobility: Rolling Right;Rolling Left;Supine to Sit;Sitting - Scoot to [place] of Bed;Sit to Supine Rolling Right: 3: Mod assist Rolling Left: 3: Mod assist Supine to Sit: 1: +2 Total assist Supine to Sit: Patient Percentage: 20% Sitting - Scoot to Edge of Bed: 1: +2 Total assist Sitting - Scoot to Edge of Bed: Patient Percentage: 30% Sit to Supine: 1: +2 Total assist Sit to Supine: Patient Percentage: 10% Transfers Transfers: Not assessed    Exercises General Exercises - Lower Extremity Ankle Circles/Pumps:  AROM;Both;20 reps;Supine Long Arc Quad: AROM;Left;10 reps   PT Diagnosis: Difficulty walking;Generalized weakness  PT Problem List: Decreased strength;Decreased activity tolerance;Decreased balance;Decreased mobility;Decreased knowledge of use of DME;Cardiopulmonary status limiting activity PT Treatment Interventions: DME instruction;Gait training;Functional mobility training;Therapeutic activities;Therapeutic exercise;Balance training;Patient/family education;Stair training     PT Goals(Current goals can be found in the care plan section) Acute Rehab PT Goals Patient Stated Goal: to go home PT Goal Formulation: With patient Time For Goal Achievement: 08/16/13 Potential to Achieve Goals: Fair  Visit Information  Last PT Received On: 08/02/13 Assistance Needed: +2 History of Present Illness: 78 yo with ischemic cardiomyopathy, severe PAD admitted with abdominal pain.  CT abdomen demonstrated extensive ischemic changes. Was urgently intubated for shock and respiratory failure and transferred to ICU. Extubated then back to OR for R AKA 12/16 and remains vented again post op.        Prior Functioning  Home Living Family/patient expects to be discharged to:: Private residence Living Arrangements: Other relatives Available Help at Discharge: Family;Available 24 hours/day Type of Home: House Home Access: Stairs to enter [employer] of Steps: 1 Entrance Stairs-Rails: None Home Layout: One level Home Equipment: Walker - 2 wheels;Wheelchair - power;Tub bench Prior Function Level of Independence: Independent with assistive device(s) Comments: From previous encounter notes, Dtr reports that pt with limited activity.  He performed self care activities, but only walked from bedroom to kitchen to bathroom Dominant Hand: Right    Cognition  Cognition Arousal/Alertness: Awake/alert Behavior During Therapy: [facility] for tasks assessed/performed Overall Cognitive Status: Within  Functional Limits for tasks assessed    Extremity/Trunk  Assessment Upper Extremity Assessment Upper Extremity Assessment: Defer to OT evaluation Lower Extremity Assessment Lower Extremity Assessment: Generalized weakness;RLE deficits/detail RLE Deficits / Details: R AKA   Balance Static Sitting Balance Static Sitting - Balance Support: No upper extremity supported;Feet supported Static Sitting - Level of Assistance: 3: Mod assist;2: Max assist Static Sitting - Comment/# of Minutes: 6 minutes EOB, mod assist from posterior support and anterior guard from +2. Patient unable to maintain seated position without assist with weak trunk.   End of Session PT - End of Session Equipment Utilized During Treatment: Gait belt;Oxygen Activity Tolerance: Patient limited by fatigue Patient left: in bed;with call bell/phone within reach Nurse Communication: Mobility status;Need for lift equipment  GP     Fabio Asa 08/02/2013, 3:33 PM Charlotte Crumb, PT DPT  [phone]

## 2013-08-03 LAB — BASIC METABOLIC PANEL
BUN: 30 mg/dL — ABNORMAL HIGH (ref 6–23)
Calcium: 7.7 mg/dL — ABNORMAL LOW (ref 8.4–10.5)
Chloride: 98 mEq/L (ref 96–112)
GFR calc Af Amer: >90 mL/min (ref >90)
GFR calc non Af Amer: 90 mL/min — ABNORMAL LOW (ref >90)
Glucose, Bld: 114 mg/dL — ABNORMAL HIGH (ref 70–99)
Potassium: 3.9 mEq/L (ref 3.5–5.1)
Sodium: 135 mEq/L (ref 135–145)

## 2013-08-03 LAB — GLUCOSE, CAPILLARY
Glucose-Capillary: 123 mg/dL — ABNORMAL HIGH (ref 70–99)
Glucose-Capillary: 138 mg/dL — ABNORMAL HIGH (ref 70–99)
Glucose-Capillary: 153 mg/dL — ABNORMAL HIGH (ref 70–99)

## 2013-08-03 LAB — CBC WITH DIFFERENTIAL/PLATELET
Eosinophils Absolute: 0.1 10*3/uL (ref 0.0–0.7)
Eosinophils Relative: 2 % (ref 0–5)
Hemoglobin: 8.5 g/dL — ABNORMAL LOW (ref 13.0–17.0)
Lymphs Abs: 1.2 10*3/uL (ref 0.7–4.0)
MCH: 29.9 pg (ref 26.0–34.0)
MCV: 89.8 fL (ref 78.0–100.0)
Monocytes Relative: 7 % (ref 3–12)
Neutro Abs: 5.3 10*3/uL (ref 1.7–7.7)
Neutrophils Relative %: 74 % (ref 43–77)
Platelets: 123 10*3/uL — ABNORMAL LOW (ref 150–400)
RBC: 2.84 MIL/uL — ABNORMAL LOW (ref 4.22–5.81)
WBC: 7.2 10*3/uL (ref 4.0–10.5)

## 2013-08-03 LAB — MAGNESIUM: Magnesium: 2.1 mg/dL (ref 1.5–2.5)

## 2013-08-03 LAB — SEDIMENTATION RATE: Sed Rate: 74 mm/hr — ABNORMAL HIGH (ref 0–16)

## 2013-08-03 LAB — PRO B NATRIURETIC PEPTIDE: Pro B Natriuretic peptide (BNP): 8721 pg/mL — ABNORMAL HIGH (ref 0–450)

## 2013-08-03 LAB — PHOSPHORUS: Phosphorus: 3.7 mg/dL (ref 2.3–4.6)

## 2013-08-03 LAB — PROTIME-INR
INR: 1.04 (ref 0.00–1.49)
Prothrombin Time: 13.4 seconds (ref 11.6–15.2)

## 2013-08-03 MED ORDER — ALPRAZOLAM 0.25 MG PO TABS
0.2500 mg | ORAL_TABLET | Freq: Two times a day (BID) | ORAL | Status: DC | PRN
  Administered 2013-08-03 – 2013-08-07 (×4): 0.25 mg via ORAL
  Filled 2013-08-03 (×4): qty 1

## 2013-08-03 MED ORDER — METHYLPREDNISOLONE SODIUM SUCC 125 MG IJ SOLR
60.0000 mg | Freq: Three times a day (TID) | INTRAMUSCULAR | Status: DC
  Administered 2013-08-03 – 2013-08-05 (×6): 60 mg via INTRAVENOUS
  Filled 2013-08-03 (×7): qty 0.96
  Filled 2013-08-03: qty 2
  Filled 2013-08-03 (×2): qty 0.96

## 2013-08-03 MED ORDER — SERTRALINE HCL 100 MG PO TABS
100.0000 mg | ORAL_TABLET | Freq: Every day | ORAL | Status: DC
  Administered 2013-08-03 – 2013-08-09 (×6): 100 mg via ORAL
  Filled 2013-08-03 (×7): qty 1

## 2013-08-03 MED ORDER — FAT EMULSION 20 % IV EMUL
250.0000 mL | INTRAVENOUS | Status: AC
  Administered 2013-08-03: 250 mL via INTRAVENOUS
  Filled 2013-08-03: qty 250

## 2013-08-03 MED ORDER — FUROSEMIDE 10 MG/ML IJ SOLN
40.0000 mg | Freq: Two times a day (BID) | INTRAMUSCULAR | Status: DC
  Administered 2013-08-03 – 2013-08-04 (×2): 40 mg via INTRAVENOUS
  Filled 2013-08-03 (×3): qty 4

## 2013-08-03 MED ORDER — TRACE MINERALS CR-CU-F-FE-I-MN-MO-SE-ZN IV SOLN
INTRAVENOUS | Status: AC
  Administered 2013-08-03: 17:00:00 via INTRAVENOUS
  Filled 2013-08-03: qty 2400

## 2013-08-03 MED ORDER — INSULIN ASPART 100 UNIT/ML ~~LOC~~ SOLN
0.0000 [IU] | Freq: Four times a day (QID) | SUBCUTANEOUS | Status: DC
  Administered 2013-08-03 – 2013-08-04 (×2): 2 [IU] via SUBCUTANEOUS
  Administered 2013-08-04 (×3): 1 [IU] via SUBCUTANEOUS
  Administered 2013-08-04: 2 [IU] via SUBCUTANEOUS
  Administered 2013-08-05 (×3): 1 [IU] via SUBCUTANEOUS
  Administered 2013-08-05: 2 [IU] via SUBCUTANEOUS
  Administered 2013-08-06 (×3): 1 [IU] via SUBCUTANEOUS

## 2013-08-03 NOTE — Progress Notes (Signed)
PULMONARY  / CRITICAL CARE MEDICINE  Name: [name] MRN: [id number] DOB: [birth year]    ADMISSION DATE:  07/18/2013 CONSULTATION DATE: 07/21/2013  REFERRING MD :  [facility] PRIMARY SERVICE: PCCM  CHIEF COMPLAINT:  ARF  BRIEF PATIENT DESCRIPTION:  78 yo with ischemic cardiomyopathy, severe PAD admitted with abdominal pain.  CT abdomen demonstrated extensive ischemic changes. Was urgently intubated for shock and respiratory failure and transferred to ICU. Extubated then back to OR for R AKA 12/16 and remains vented again post op.    LINES / TUBES: 12- 8 OTT>>12/14, 12-16 intubated in or>> 12/17>>12-17 12-8 Rt fem cvl>>12-8 12-8 Lt IJ cvl>> R axillary aline 12/8 >> 12/18  CULTURES: 12-8 bc x 2>>ng 12-8 sputum>>e coli S to ceftx / zosyn  ANTIBIOTICS: 12-8 vanc>>12/19 12-8 diflucan>> 12-8 pip tazo>>   SIGNIFICANT EVENTS / STUDIES:  12/8 intubated, shock, surgical evaluation, DNR established, Exploratory laparotomy with entrectomy (majority of jejunum and proximal ileum) 12/9 Segmental small bowel resection terminal ileum  Two anastomoses to re-establish intestinal continuity 12/11 requires higher PEEP/FIO2 12/12 s/p R LE endarterectomy (Dr Early)  12/16 To OR for Rt AKA, returned intubated with 80% fio2 07/30/13 - EXTUBATED  12/18 intermittent NIMVS on 70% NRB 08/02/13: using bipap prn self directed for resp comfort. Good diuresis with lasix but RN says still sounds wet. Still hard to wean off NRB. Otherwise ok. Wants to go home 08/03/13 - ESR 74   SUBJECTIVE/OVERNIGHT/INTERVAL HX 12//21/14 - ESR 74. Noted to be on amiodarone long time STill using prn bipap. Still with high o2 needs despite -2L negative last 7 days. Having anxiety.  Wife says patient keen to go home and now realizing is long haul and feels he might be giving up Wants his anxiety pill started.     VITAL SIGNS: Temp:  [97.3 F (36.3 C)-98.8 F (37.1 C)] 97.4 F (36.3 C) (12/21 1137) Pulse Rate:  [87-107] 98  (12/21 1304) Resp:  [19-33] 33 (12/21 1304) BP: (100-126)/(45-69) 126/61 mmHg (12/21 1304) SpO2:  [92 %-100 %] 96 % (12/21 1304) FiO2 (%):  [40 %-100 %] 40 % (12/21 1304) Weight:  [74.5 kg (164 lb 3.9 oz)] 74.5 kg (164 lb 3.9 oz) (12/21 0600) INTAKE / OUTPUT: Intake/Output     12/20 0701 - 12/21 0700 12/21 0701 - 12/22 0700   P.O. 15    I.V. (mL/kg) 380 (5.1) 60 (0.8)   IV Piggyback 212.5 125   TPN 2640 660   Total Intake(mL/kg) 3247.5 (43.6) 845 (11.3)   Urine (mL/kg/hr) 3750 (2.1) 500 (1)   Total Output 3750 500   Net -502.5 +345          PHYSICAL EXAMINATION: General: no distress Neuro:   Awake, alert, follows commands  HEENT: BiPAP mask in place Cardiovascular: regular Lungs: b/l rales Abdomen: soft, wound dressing clean Musculoskeletal:  1+ edema Lt leg, Rt leg AKA stump clean Skin: no rashes  LABS:  PULMONARY  Recent Labs Lab 07/27/13 2341 07/29/13 1707 07/30/13 0407  PHART 7.353 7.420 7.498*  PCO2ART 42.8 45.0 38.4  PO2ART 61.0* 123.0* 76.2*  HCO3 23.9 28.1* 29.6*  TCO2 25 29 30.7  O2SAT 90.0 98.0 96.4    CBC  Recent Labs Lab 08/01/13 0400 08/02/13 0415 08/03/13 0430  HGB 8.1* 8.4* 8.5*  HCT 24.1* 25.2* 25.5*  WBC 10.1 8.1 7.2  PLT 95* 105* 123*    COAGULATION  Recent Labs Lab 08/03/13 0430  INR 1.04    CARDIAC  No results found for  this basename: TROPONINI,  in the last 168 hours  Recent Labs Lab 08/03/13 0430  PROBNP 8721.0*     CHEMISTRY  Recent Labs Lab 07/28/13 0405  07/29/13 0400  07/30/13 0415 07/31/13 0410 08/01/13 0400 08/02/13 0415 08/03/13 0430  NA 135  < > 136  < > 135 134* 134* 133* 135  K 3.6  < > 3.4*  < > 3.7 3.5 4.2 3.7 3.9  CL 104  < > 102  < > 102 100 101 98 98  CO2 25  < > 29  < > 28 30 28 31 30   GLUCOSE 148*  < > 131*  < > 144* 139* 123* 115* 114*  BUN 19  < > 20  < > 28* 27* 27* 29* 30*  CREATININE 0.73  < > 0.73  < > 0.75 0.70 0.64 0.72 0.69  CALCIUM 7.4*  < > 7.4*  < > 6.9* 7.3* 7.3* 7.7*  7.7*  MG 1.7  --  1.7  --  1.9 1.7  --  1.8 2.1  PHOS 2.7  --   --   --   --  3.5  --   --  3.7  < > = values in this interval not displayed. Estimated Creatinine Clearance: 82.8 ml/min (by C-G formula based on Cr of 0.69).   LIVER  Recent Labs Lab 07/30/13 0415 08/02/13 0415 08/03/13 0430  AST 47* 36  --   ALT 85* 47  --   ALKPHOS 143* 172*  --   BILITOT 1.7* 2.0*  --   PROT 3.8* 4.8*  --   ALBUMIN 1.2* 1.3*  --   INR  --   --  1.04     INFECTIOUS No results found for this basename: LATICACIDVEN, PROCALCITON,  in the last 168 hours   ENDOCRINE CBG (last 3)   Recent Labs  08/02/13 1637 08/02/13 2033 08/03/13 0005  GLUCAP 118* 130* 138*     Recent Labs Lab 08/03/13 0430  PROBNP 8721.0*        IMAGING x48h  Dg Chest Port 1 View  08/02/2013   CLINICAL DATA:  Pneumonia.  CHF.  EXAM: PORTABLE CHEST - 1 VIEW  COMPARISON:  08/01/2013.  FINDINGS: Left IJ central line is present with the tip in the upper SVC. Position is unchanged compared to yesterday. Monitoring leads project over the chest. The appearance of the lungs is slightly worse with interstitial and airspace opacities and more focal consolidation in the right midlung which was present on prior exams consistent with scarring. Cardiopericardial silhouette remains enlarged.  IMPRESSION: 1. Stable support apparatus. 2. Slight interval increase in interstitial and airspace opacities likely representing pulmonary edema and CHF.   Electronically Signed   By: Andreas Newport M.D.   On: 08/02/2013 07:38     ASSESSMENT / PLAN:  PULMONARY A: Acute respiratory failure in setting of ischemic bowel.  Required intubation for Rt AKA 12/14.  Complicated by HCAP. Hx of COPD. Tobacco abuse.  08/02/13 and 08/03/13 - still h ypoxemic despote 3.8L negative x 7 days. CXR with ALI - ? amio lung tox esp with ESR 74. On prn bipap P:   - continue lasix - empiric IV steroids -scheduled BD's -change to brovana with prn  xopenex -continue IS, flutter valve -BiPAP prn  CARDIOVASCULAR A:  Shock- presumed sepsis, SIRS - resolved CAD/ isch cardiomyopathy -EF 35% Vasculopathy -ischemic RLE > s/p R AKA 12/16 NSTEMI Volume overload/ pulm edema  A fib  08/03/13 -  concern for acute amio lung tox  P:  -Negative fluid balance as tolerated >> lasix 40 mg q8h x 3 doses on 12/19; to continue 08/02/13 and 08/03/13 -dc amiodarone   RENAL A:   Metabolic acidosis >> resolved.  P:   -f/u renal fx, urine outpt -replace electrolytes as needed  GASTROINTESTINAL A:   SBO/Ischemia s/p extensive SB resection Shock liver -LFTs much improved  H/o antral ulcers  12/21.14 - no appetite. Unable to do his full iquid diet. Stil on  TNA P:   -CCS following -continue TNA  - monitor anorexia  HEMATOLOGIC A:   Thrombocytopenia. Coagulopathy-  With low fibrinogen, low grade DIC picture likely related to ischemic leg >> improved after amputation. P:   -SQ heparin since 12/19 for DVT prevention -Follow CBC  INFECTIOUS A:   E coli PNA. Peritonitis. P:   -day 15 of zosyn, diflucan; need to talk to CCS 08/04/13 to figure out endpoint   ENDOCRINE A:   Hyperglycemia. P:   -SSI  NEUROLOGIC A:  Pain control - much improved post AKA. P:   PRN fent for pain  GLOBAL  08/03/13 - yesterday patient very keen to go home. Today wife says he is despondent because he has realized this will be a long haul and wants to give up. Wife open to meeting with palliative care to understand his illness, prognosis and goals of care. Consult ordered   The patient is critically ill with multiple organ systems failure and requires high complexity decision making for assessment and support, frequent evaluation and titration of therapies, application of advanced monitoring technologies and extensive interpretation of multiple databases.   Critical Care Time devoted to patient care services described in this note is  35   Minutes.  Dr. Kalman Shan, M.D., [facility].C.P Pulmonary and Critical Care Medicine Staff Physician [facility] Pager: [phone], If no answer or between  15:00h - 7:00h: call [phone]  08/03/2013 1:56 PM

## 2013-08-03 NOTE — Progress Notes (Signed)
5 Days Post-Op  Subjective: On NRB Taking min PO  Objective: Vital signs in last 24 hours: Temp:  [97.3 F (36.3 C)-98.8 F (37.1 C)] 97.3 F (36.3 C) (12/21 0751) Pulse Rate:  [87-107] 94 (12/21 0736) Resp:  [19-33] 26 (12/21 0736) BP: (92-126)/(45-62) 120/54 mmHg (12/21 0736) SpO2:  [92 %-100 %] 100 % (12/21 0736) FiO2 (%):  [40 %-100 %] 100 % (12/21 0736) Weight:  [164 lb 3.9 oz (74.5 kg)] 164 lb 3.9 oz (74.5 kg) (12/21 0600) Last BM Date: 08/02/13  Intake/Output from previous day: 12/20 0701 - 12/21 0700 In: 3125 [P.O.:15; I.V.:380; IV Piggyback:200; TPN:2530] Out: 3750 [Urine:3750] Intake/Output this shift:    General appearance: alert and cooperative Resp: coarse B/L BS GI: soft, NTTP, ND, active BS, wound c/d/i  Lab Results:   Recent Labs  08/02/13 0415 08/03/13 0430  WBC 8.1 7.2  HGB 8.4* 8.5*  HCT 25.2* 25.5*  PLT 105* 123*   BMET  Recent Labs  08/01/13 0400 08/02/13 0415  NA 134* 133*  K 4.2 3.7  CL 101 98  CO2 28 31  GLUCOSE 123* 115*  BUN 27* 29*  CREATININE 0.64 0.72  CALCIUM 7.3* 7.7*   PT/INR  Recent Labs  08/03/13 0430  LABPROT 13.4  INR 1.04   ABG No results found for this basename: PHART, PCO2, PO2, HCO3,  in the last 72 hours  Studies/Results: Dg Chest Port 1 View  08/02/2013   CLINICAL DATA:  Pneumonia.  CHF.  EXAM: PORTABLE CHEST - 1 VIEW  COMPARISON:  08/01/2013.  FINDINGS: Left IJ central line is present with the tip in the upper SVC. Position is unchanged compared to yesterday. Monitoring leads project over the chest. The appearance of the lungs is slightly worse with interstitial and airspace opacities and more focal consolidation in the right midlung which was present on prior exams consistent with scarring. Cardiopericardial silhouette remains enlarged.  IMPRESSION: 1. Stable support apparatus. 2. Slight interval increase in interstitial and airspace opacities likely representing pulmonary edema and CHF.   Electronically  Signed   By: Andreas Newport M.D.   On: 08/02/2013 07:38    Anti-infectives: Anti-infectives   Start     Dose/Rate Route Frequency Ordered Stop   07/29/13 2200  vancomycin (VANCOCIN) IVPB 750 mg/150 ml premix  Status:  Discontinued     750 mg 150 mL/hr over 60 Minutes Intravenous Every 12 hours 07/29/13 1014 08/01/13 0941   07/25/13 1115  vancomycin (VANCOCIN) IVPB 1000 mg/200 mL premix     1,000 mg 200 mL/hr over 60 Minutes Intravenous To Surgery 07/25/13 1108 07/25/13 1111   07/22/13 2200  vancomycin (VANCOCIN) IVPB 1000 mg/200 mL premix  Status:  Discontinued     1,000 mg 200 mL/hr over 60 Minutes Intravenous Every 12 hours 07/22/13 2056 07/29/13 1014   07/21/13 1000  fluconazole (DIFLUCAN) IVPB 200 mg     200 mg 100 mL/hr over 60 Minutes Intravenous Every 24 hours 07/21/13 0944     07/21/13 0945  metroNIDAZOLE (FLAGYL) IVPB 500 mg  Status:  Discontinued     500 mg 100 mL/hr over 60 Minutes Intravenous Every 8 hours 07/21/13 0933 07/25/13 0918   07/21/13 0945  piperacillin-tazobactam (ZOSYN) IVPB 3.375 g     3.375 g 12.5 mL/hr over 240 Minutes Intravenous 3 times per day 07/21/13 0944     07/21/13 0800  vancomycin (VANCOCIN) IVPB 1000 mg/200 mL premix  Status:  Discontinued     1,000 mg 200 mL/hr over  60 Minutes Intravenous Every 12 hours 07/21/13 0626 07/22/13 1045   07/21/13 0615  ceFEPIme (MAXIPIME) 1 g in dextrose 5 % 50 mL IVPB  Status:  Discontinued     1 g 100 mL/hr over 30 Minutes Intravenous 3 times per day 07/21/13 1610 07/21/13 0919      Assessment/Plan: s/p Procedure(s): AMPUTATION ABOVE KNEE (Right)  Would con't to encrouage FLD  Con't TPN  LOS: 16 days    Marigene Ehlers., Jed Limerick 08/03/2013

## 2013-08-03 NOTE — Progress Notes (Signed)
Palliative Medicine Team  Goals of care meeting scheduled with wife, grandson and patient's children at 10:30AM 12/22.  Anderson Malta, DO Palliative Medicine

## 2013-08-03 NOTE — Progress Notes (Signed)
Called by RN that pt wants to be NTS again for sputum pt cannot cough up. Small [name] secretions obtain by NTS but pt was able to cough up moderate amount of [name] sputum.

## 2013-08-03 NOTE — Progress Notes (Signed)
Pt placed on BiPAP for increased WOB.

## 2013-08-03 NOTE — Progress Notes (Signed)
Pt NTS because of difficulty coughing up secretions and congestion. Received moderate amounts of Deklin Bieler secretions. Pt states he feels he cant breath better now.

## 2013-08-03 NOTE — Progress Notes (Signed)
PARENTERAL NUTRITION CONSULT NOTE  Pharmacy Consult:  TPN Indication: massive bowel resection  No Known Allergies  Patient Measurements: Height: 6' (182.9 cm) Weight: 164 lb 3.9 oz (74.5 kg) IBW/kg (Calculated) : 77.6  Vital Signs: Temp: 97.3 F (36.3 C) (12/21 0751) Temp src: Oral (12/21 0751) BP: 120/54 mmHg (12/21 0736) Pulse Rate: 94 (12/21 0736) Intake/Output from previous day: 12/20 0701 - 12/21 0700 In: 3125 [P.O.:15; I.V.:380; IV Piggyback:200; TPN:2530] Out: 3750 [Urine:3750] Intake/Output from this shift:    Labs:  Recent Labs  08/01/13 0400 08/02/13 0415 08/03/13 0430  WBC 10.1 8.1 7.2  HGB 8.1* 8.4* 8.5*  HCT 24.1* 25.2* 25.5*  PLT 95* 105* 123*  INR  --   --  1.04     Recent Labs  08/01/13 0400 08/02/13 0415 08/03/13 0430  NA 134* 133* 135  K 4.2 3.7 3.9  CL 101 98 98  CO2 28 31 30   GLUCOSE 123* 115* 114*  BUN 27* 29* 30*  CREATININE 0.64 0.72 0.69  CALCIUM 7.3* 7.7* 7.7*  MG  --  1.8 2.1  PHOS  --   --  3.7  PROT  --  4.8*  --   ALBUMIN  --  1.3*  --   AST  --  36  --   ALT  --  47  --   ALKPHOS  --  172*  --   BILITOT  --  2.0*  --    Estimated Creatinine Clearance: 82.8 ml/min (by C-G formula based on Cr of 0.69).    Recent Labs  08/02/13 1637 08/02/13 2033 08/03/13 0005  GLUCAP 118* 130* 138*     Insulin Requirements in the past 24 hours:  1 units SSI  Admit: [name] admitted with N/V, dehydration, abdominal pain and enteritis d/t norovirus. Found to have mesenteric ischemia and infarcted small bowel. Underwent small bowel resection-majority of jejunum and proximal ileum- currently has minimal amount of small bowel needed to survive.  Patient was started on a full liquid diet but he ate minimally.  RN reported patient has no interest in the diet; no hindrance.  Only taking 1 bite of applesauce with po amio and has BM immediately afterwards.   GI: 12/7 EGD showed changes suggestive of severe erosive ulcerative gastritis,  duodenitis and jujinitis - 12/7 CT angiogram suggested diffuse calcifications of mesenteric circulation; 12/8 massive bowel resection - majority of jejunum and proximal ileum. 12/9 resection of terminal ileum. Two anastomoses to re-establish intestinal continuity. Pt pulled out NG tube on 12/15.  Full diet ordered 12/18 but only taking ice chips and meds PO.  PPI IV.  Only taking 1 bite of applesauce with po amio and has BM immediately afterwards.  Endo: No hx DM - all CBGs < 150 Lytes: lytes WNL, phos 3.7, mag 2.1  Renal: SCr 0.69  (stable), CrCL 83 ml/min, excellent UOP on Lasix Pulm: Hx COPD / tobacco abuse. Extubated 12/14, re-intubated on 12/16 for surgery, now extubated again, BiPAP/NRM/PRM - Xopenex, Brovana Cards: Hx CAD (CABG '95 + PCI '10) / DL / HTN / PVD / Aflutter. R-AKA done on 12/16 d/t severe PVD. S/p shock - BP normal, ST - on amiodarone, intermittent lasix Hepatobil: Shocked liver resolving - AST/ALT normalized, alk phos/tbili trending up slightly, TG down to 140, pre-albumin improved to 6.4 Neuro: Hx CVA/depression. GCS 15 - no sedation.  Heme: aPTT 45 seconds, PT 18, INR 1.53- improving. Patient has received vitamin K and FFP as reversal before surgeries. Hgb 8.1,  plts 95. Does have ulcers and anticoagulation has been held in the past d/t GI bleed. Has low fibrinogen and high D-Dimer.  ID:  Zosyn + Fluconazole for E.coli PNA + high risk of perforation, r/o bacterial translocation. Afebrile, WBC WNL Best Practices: heparin SQ, MC, SCDs, PPI IV TPN Access: LIJ- triple lumen (placed 12/8) TPN day#: 10 (12/10 >> current)  Current Nutrition:  Full liquid diet ordered, only taking ice chips and 1 bite of applesauce with po amiodarone Clinimix E 5/15 at 100 ml/hr and lipid 20% at 10 ml/hr provides 2184 kcal and 120 grams of protein  Nutritional Goals:  2000-2200 kCal, 110-120 grams of protein per day    Plan - Continue Clinimix E 5/15 at 100 ml/hr + IVFE at 10 ml/hr - Daily  multivitamin and trace elements - F/U PO intake to start weaning TPN  [name], Pharm.D. [phone] 08/03/2013 8:54 AM

## 2013-08-04 DIAGNOSIS — J155 Pneumonia due to Escherichia coli: Secondary | ICD-10-CM

## 2013-08-04 DIAGNOSIS — R5381 Other malaise: Secondary | ICD-10-CM

## 2013-08-04 LAB — PREALBUMIN: Prealbumin: 8.5 mg/dL — ABNORMAL LOW (ref 17.0–34.0)

## 2013-08-04 LAB — GLUCOSE, CAPILLARY
Glucose-Capillary: 118 mg/dL — ABNORMAL HIGH (ref 70–99)
Glucose-Capillary: 151 mg/dL — ABNORMAL HIGH (ref 70–99)
Glucose-Capillary: 142 mg/dL — ABNORMAL HIGH (ref 70–99)

## 2013-08-04 LAB — TRIGLYCERIDES: Triglycerides: 106 mg/dL (ref <150)

## 2013-08-04 LAB — CBC WITH DIFFERENTIAL/PLATELET
Basophils Relative: 0 % (ref 0–1)
Hemoglobin: 8.1 g/dL — ABNORMAL LOW (ref 13.0–17.0)
Lymphocytes Relative: 12 % (ref 12–46)
Lymphs Abs: 0.6 10*3/uL — ABNORMAL LOW (ref 0.7–4.0)
Monocytes Relative: 3 % (ref 3–12)
Neutro Abs: 4.1 10*3/uL (ref 1.7–7.7)
Neutrophils Relative %: 85 % — ABNORMAL HIGH (ref 43–77)
RBC: 2.73 MIL/uL — ABNORMAL LOW (ref 4.22–5.81)
WBC: 4.8 10*3/uL (ref 4.0–10.5)

## 2013-08-04 LAB — PHOSPHORUS: Phosphorus: 3.9 mg/dL (ref 2.3–4.6)

## 2013-08-04 MED ORDER — FAT EMULSION 20 % IV EMUL
250.0000 mL | INTRAVENOUS | Status: AC
  Administered 2013-08-04: 250 mL via INTRAVENOUS
  Filled 2013-08-04: qty 250

## 2013-08-04 MED ORDER — FUROSEMIDE 10 MG/ML IJ SOLN
80.0000 mg | Freq: Four times a day (QID) | INTRAMUSCULAR | Status: DC
  Administered 2013-08-04 – 2013-08-05 (×4): 80 mg via INTRAVENOUS
  Filled 2013-08-04 (×7): qty 8

## 2013-08-04 MED ORDER — ONDANSETRON HCL 4 MG/2ML IJ SOLN
4.0000 mg | Freq: Four times a day (QID) | INTRAMUSCULAR | Status: DC | PRN
  Administered 2013-08-07: 4 mg via INTRAVENOUS
  Filled 2013-08-04: qty 2

## 2013-08-04 MED ORDER — ONDANSETRON HCL 4 MG/2ML IJ SOLN
INTRAMUSCULAR | Status: AC
  Filled 2013-08-04: qty 2

## 2013-08-04 MED ORDER — TRACE MINERALS CR-CU-F-FE-I-MN-MO-SE-ZN IV SOLN
INTRAVENOUS | Status: AC
  Administered 2013-08-04: 18:00:00 via INTRAVENOUS
  Filled 2013-08-04: qty 2000

## 2013-08-04 NOTE — Consult Note (Signed)
Patient ID:[name]      DOB: 10-17-1934      ZOX:[id number]     Consult Note from the Palliative Medicine Team at [facility]    Consult Requested by: Dr Tyson Alias     PCP: [name], MD Reason for Consultation:Clarifciation of GOC and options     Phone [phone]  Assessment of patients Current state:   78 yo with ischemic cardiomyopathy, severe PAD admitted with abdominal pain. CT abdomen demonstrated extensive ischemic changes. Was urgently intubated for shock and respiratory failure and transferred to ICU. Extubated then back to OR for R AKA 12/16  Complicated medical situation with overall poor prognosis.  Multiple system failure, acute respiratory failure, COPD, vasculopathy/RAKA, NSTEMI, SBO/ischemia/extensive SB resection, on TPN for nutritional support), infection.  Overall failure to thrive.  Some family members have  strong religious convictions that everything medically availalbe must be done and "when God decides he will take him".  One daughter has a 9 yo son born with hydrocephalus and was told she should terminate the pregnancy, she see this as a similar situation.   Family express "feeling pressure" to de-escalate care  Consult is for review of medical treatment options, clarification of goals of care and end of life issues, disposition and options, and symptom recommendation.  This NP Lorinda Creed reviewed medical records, received report from team, assessed the patient and then meet at the patient's bedside along with his wife Jamarcus Laduke # [phone], daughter Linford Arnold and Ahmed Prima # [phone] Onalee Hua  to discuss diagnosis prognosis, GOC, EOL wishes disposition and options.   A detailed discussion was had today regarding advanced directives.  Concepts specific to code status, artifical feeding and hydration, continued IV antibiotics and rehospitalization was had.  The difference between a aggressive medical intervention path  and a palliative  comfort care path for this patient at this time was had.  Values and goals of care important to patient and family were attempted to be elicited.  Concept of Hospice and Palliative Care were discussed  Natural trajectory and expectations at EOL were discussed.  Questions and concerns addressed.  Hard Choices booklet left for review. Family encouraged to call with questions or concerns.  PMT will continue to support holistically.      Goals of Care: 1.  Code Status:  Patient is listed as a partial code.   According to Onalee Hua (son-in-law) the patient fluctuates on his decision regarding re-intubation.  Patient is influenced by family members.  Today patietn reluctantly expresses wish for no further intubation (capacity questionable).  Will continue to support this family navigating medical decsions.   2. Scope of Treatment: 1.  Family is hopeful for improvement.  They desire all available and offered medial interventions to prolong life.    4. Disposition:  Dependant on outcomes   3. Symptom Management:   1.  Weakness/Failure to thrive:  Continue medical management and make decisions "one day at a time"  4. Psychosocial:  Emotional support offered to family and patietn.  They express some frustration that although "the doctors" continue to tell them he has minimal chance of survival, the patietn continues to live another day.  We spoke in detail the idea of only "putting a band aid " on individual medical problems but that as a whole his body is failing to thrive.  5. Spiritual:  Strong community church support      Patient Documents Completed or Given: Document Given Completed  Advanced Directives Pkt  MOST    DNR    Gone from My Sight    Hard Choices yes     Brief HPI:78 yo with ischemic cardiomyopathy, severe PAD admitted with abdominal pain. CT abdomen demonstrated extensive ischemic changes. Was urgently intubated for shock and respiratory failure and transferred to  ICU. Extubated then back to OR for R AKA 12/16  Complicated medical situation with overall poor prognosis.  Multiple system failure, acute respiratory failure, COPD, vasculopathy/RAKA, NSTEMI, SBO/ischemia/extensive SB resection, on TPN for nutritional support), infection. Poor response to offered therapies, continue to decleine    ROS: denies pain or discomfort    PMH:  Past Medical History  Diagnosis Date  . CEREBROVASCULAR ACCIDENT, HX OF 03/02/2007  . CHEST PAIN 01/31/2010  . CHRONIC OBSTRUCTIVE PULMONARY DISEASE, ACUTE EXACERBATION 02/18/2009  . COPD 03/02/2007  . CORONARY ARTERY DISEASE 03/02/2007  . DEPRESSION 09/23/2007    anxiety  . DISEASE, PERIPHERAL VASCULAR NEC 03/02/2007  . DIVERTICULOSIS, COLON 09/23/2007  . FATIGUE 01/22/2009  . GERD 03/02/2007  . GLUCOSE INTOLERANCE 09/23/2007  . HEMORRHOIDS 10/09/2007  . HIATAL HERNIA 10/09/2007  . HYPERCHOLESTEROLEMIA 10/09/2007  . HYPERTENSION 09/23/2007  . HYPOKALEMIA 10/09/2007  . MESENTERIC VASCULAR INSUFFICIENCY 07/06/2008  . MITRAL REGURGITATION 11/27/2008  . MI 10/09/2007  . PERIPHERAL VASCULAR DISEASE 09/23/2007  . PUD 10/09/2007  . RENAL ARTERY STENOSIS 03/02/2007  . TOBACCO ABUSE 04/20/2010  . 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 URINARY RETENTION 01/22/2009  . VITAMIN B12 DEFICIENCY 01/03/2010  . WRIST PAIN, RIGHT 06/06/2010  . Impaired glucose tolerance 01/29/2011  . Hearing loss in left ear 01/30/2011  . Blindness of both eyes 01/30/2011  . Hyperlipidemia   . Anemia, iron deficiency   . Atrial flutter   . HTN (hypertension) 03/21/2011  . AVM (arteriovenous malformation) 09/2010    stomach and duodenum  . PUD (peptic ulcer disease) 09/2010    pyloric ulcer     PSH: Past Surgical History  Procedure Laterality Date  . Coronary artery bypass graft  1995    LIMA-LAD, SVG-OM, SVG-D, SVG-PDA  . Renal artery stenting  2004    bilateral  . S/p multiple le vascular bypass      includine fem-pop x 2 LLE  . S/p bilateral cea     . Coronary angioplasty with stent placement  2010    DES x 2 SVG-OM, cutting balloon PTCA SVG-PDA for ISR  . Appendectomy    . S/p lumbar disc surgury      x2  . S/p rle fem bypass feb 2011    . Cardiac catheterization    . Pr vein bypass graft,aorto-fem-pop    . Carotid endarterectomy    . Coronary angioplasty with stent placement  2011    DES to SVG-DIAG, SVG-PDA  . Esophagogastroduodenoscopy    . Colonoscopy    . Esophagogastroduodenoscopy N/A 07/09/2013    Procedure: ESOPHAGOGASTRODUODENOSCOPY (EGD);  Surgeon: Meryl Dare, MD;  Location: [facility] ENDOSCOPY;  Service: Endoscopy;  Laterality: N/A;  . Esophagogastroduodenoscopy N/A 07/20/2013    Procedure: ESOPHAGOGASTRODUODENOSCOPY (EGD);  Surgeon: Hart Carwin, MD;  Location: [facility] ENDOSCOPY;  Service: Endoscopy;  Laterality: N/A;  . Laparotomy N/A 07/21/2013    Procedure: EXPLORATORY LAPAROTOMY POSSIBLE BOWEL RESECTION;  Surgeon: Velora Heckler, MD;  Location: [facility] OR;  Service: General;  Laterality: N/A;  . Laparotomy N/A 07/22/2013    Procedure: EXPLORATORY LAPAROTOMY, SMALL BOWEL RESECTION;  Surgeon: Velora Heckler, MD;  Location: [facility] OR;  Service: General;  Laterality: N/A;  .  Thrombectomy femoral artery Right 07/25/2013    Procedure: THROMBECTOMY OF  FEMORAL ARTERY;  Surgeon: Larina Earthly, MD;  Location: [facility] OR;  Service: Vascular;  Laterality: Right;  . Amputation Right 07/29/2013    Procedure: AMPUTATION ABOVE KNEE;  Surgeon: Nada Libman, MD;  Location: [facility] OR;  Service: Vascular;  Laterality: Right;   I have reviewed the FH and SH and  If appropriate update it with new information. No Known Allergies Scheduled Meds: . antiseptic oral rinse  15 mL Mouth Rinse QID  . arformoterol  15 mcg Nebulization BID  . chlorhexidine  15 mL Mouth Rinse BID  . furosemide  80 mg Intravenous Q6H  . heparin subcutaneous  5,000 Units Subcutaneous Q8H  . insulin aspart  0-9 Units Subcutaneous Q6H  . methylPREDNISolone (SOLU-MEDROL) injection  60  mg Intravenous Q8H  . pantoprazole (PROTONIX) IV  40 mg Intravenous Q12H  . sertraline  100 mg Oral Daily  . sodium chloride  10 mL Intravenous Q12H   Continuous Infusions: . sodium chloride 20 mL/hr at 08/01/13 1900  . Marland KitchenTPN (CLINIMIX-E) Adult 50 mL/hr at 08/04/13 1000   And  . fat emulsion 250 mL (08/03/13 1724)  . Marland KitchenTPN (CLINIMIX-E) Adult     And  . fat emulsion     PRN Meds:.ALPRAZolam, fentaNYL, levalbuterol, ondansetron, sodium chloride    BP 110/88  Pulse 70  Temp(Src) 97.6 F (36.4 C) (Oral)  Resp 18  Ht 6' (1.829 m)  Wt 76.2 kg (167 lb 15.9 oz)  BMI 22.78 kg/m2  SpO2 100%   PPS:30 % at best   Intake/Output Summary (Last 24 hours) at 08/04/13 1359 Last data filed at 08/04/13 1200  Gross per 24 hour  Intake 2757.5 ml  Output   4303 ml  Net -1545.5 ml    Physical Exam:  General: ill appearing, NAD, presently on NRB mask HEENT:  Dry buccal membrane Chest:   Diminished, CTA CVS: RRR Abdomen: soft minimal tenderness, decreased BS Ext:  Noted AKA Neuro: oriented to person and place  Labs: CBC    Component Value Date/Time   WBC 4.8 08/04/2013 0400   RBC 2.73* 08/04/2013 0400   RBC 3.14* 12/30/2009 0917   HGB 8.1* 08/04/2013 0400   HCT 24.5* 08/04/2013 0400   PLT 125* 08/04/2013 0400   MCV 89.7 08/04/2013 0400   MCH 29.7 08/04/2013 0400   MCHC 33.1 08/04/2013 0400   RDW 18.3* 08/04/2013 0400   LYMPHSABS 0.6* 08/04/2013 0400   MONOABS 0.2 08/04/2013 0400   EOSABS 0.0 08/04/2013 0400   BASOSABS 0.0 08/04/2013 0400    BMET    Component Value Date/Time   NA 135 08/03/2013 0430   K 3.9 08/03/2013 0430   CL 98 08/03/2013 0430   CO2 30 08/03/2013 0430   GLUCOSE 114* 08/03/2013 0430   BUN 30* 08/03/2013 0430   CREATININE 0.69 08/03/2013 0430   CALCIUM 7.7* 08/03/2013 0430   GFRNONAA 90* 08/03/2013 0430   GFRAA >90 08/03/2013 0430    CMP     Component Value Date/Time   NA 135 08/03/2013 0430   K 3.9 08/03/2013 0430   CL 98 08/03/2013 0430    CO2 30 08/03/2013 0430   GLUCOSE 114* 08/03/2013 0430   BUN 30* 08/03/2013 0430   CREATININE 0.69 08/03/2013 0430   CALCIUM 7.7* 08/03/2013 0430   PROT 4.8* 08/02/2013 0415   ALBUMIN 1.3* 08/02/2013 0415   AST 36 08/02/2013 0415   ALT 47 08/02/2013 0415  ALKPHOS 172* 08/02/2013 0415   BILITOT 2.0* 08/02/2013 0415   GFRNONAA 90* 08/03/2013 0430   GFRAA >90 08/03/2013 0430       Time In Time Out Total Time Spent with Patient Total Overall Time  1030 1200 80 min 90 min    Greater than 50%  of this time was spent counseling and coordinating care related to the above assessment and plan.  Lorinda Creed NP  Palliative Medicine Team Team Phone # [phone] Pager [phone]

## 2013-08-04 NOTE — Progress Notes (Signed)
[facility] Progress Note 6 Days Post-Op  Subjective: Patient is not improving.  On BiPAP.  Not eating at all.  Objective: Vital signs in last 24 hours: Temp:  [97.3 F (36.3 C)-98.3 F (36.8 C)] 98.3 F (36.8 C) (12/22 0821) Pulse Rate:  [68-106] 68 (12/22 0800) Resp:  [15-34] 20 (12/22 0800) BP: (100-128)/(43-69) 113/43 mmHg (12/22 0800) SpO2:  [94 %-100 %] 99 % (12/22 0800) FiO2 (%):  [40 %] 40 % (12/22 0813) Weight:  [76.2 kg (167 lb 15.9 oz)] 76.2 kg (167 lb 15.9 oz) (12/22 0700) Last BM Date: 08/02/13  Intake/Output from previous day: 12/21 0701 - 12/22 0700 In: 2885 [I.V.:240; IV Piggyback:225; TPN:2420] Out: 3801 [Urine:3800; Stool:1] Intake/Output this shift:    General: No distress, but very lethargic.  On TPM  Lungs: Clear on BiPAP  Abd: Distended, has bowel sounds.  Staples are in place.  Not very tender in the abdomen  Extremities: Edematous  Neuro: Lethargic  Lab Results:  @LABLAST2 (wbc:2,hgb:2,hct:2,plt:2) BMET  Recent Labs  08/02/13 0415 08/03/13 0430  NA 133* 135  K 3.7 3.9  CL 98 98  CO2 31 30  GLUCOSE 115* 114*  BUN 29* 30*  CREATININE 0.72 0.69  CALCIUM 7.7* 7.7*   PT/INR  Recent Labs  08/03/13 0430  LABPROT 13.4  INR 1.04   ABG No results found for this basename: PHART, PCO2, PO2, HCO3,  in the last 72 hours  Studies/Results: No results found.  Anti-infectives: Anti-infectives   Start     Dose/Rate Route Frequency Ordered Stop   07/29/13 2200  vancomycin (VANCOCIN) IVPB 750 mg/150 ml premix  Status:  Discontinued     750 mg 150 mL/hr over 60 Minutes Intravenous Every 12 hours 07/29/13 1014 08/01/13 0941   07/25/13 1115  vancomycin (VANCOCIN) IVPB 1000 mg/200 mL premix     1,000 mg 200 mL/hr over 60 Minutes Intravenous To Surgery 07/25/13 1108 07/25/13 1111   07/22/13 2200  vancomycin (VANCOCIN) IVPB 1000 mg/200 mL premix  Status:  Discontinued     1,000 mg 200 mL/hr over 60 Minutes Intravenous Every 12 hours 07/22/13  2056 07/29/13 1014   07/21/13 1000  fluconazole (DIFLUCAN) IVPB 200 mg  Status:  Discontinued     200 mg 100 mL/hr over 60 Minutes Intravenous Every 24 hours 07/21/13 0944 08/04/13 0817   07/21/13 0945  metroNIDAZOLE (FLAGYL) IVPB 500 mg  Status:  Discontinued     500 mg 100 mL/hr over 60 Minutes Intravenous Every 8 hours 07/21/13 0933 07/25/13 0918   07/21/13 0945  piperacillin-tazobactam (ZOSYN) IVPB 3.375 g  Status:  Discontinued     3.375 g 12.5 mL/hr over 240 Minutes Intravenous 3 times per day 07/21/13 0944 08/04/13 0817   07/21/13 0800  vancomycin (VANCOCIN) IVPB 1000 mg/200 mL premix  Status:  Discontinued     1,000 mg 200 mL/hr over 60 Minutes Intravenous Every 12 hours 07/21/13 0626 07/22/13 1045   07/21/13 0615  ceFEPIme (MAXIPIME) 1 g in dextrose 5 % 50 mL IVPB  Status:  Discontinued     1 g 100 mL/hr over 30 Minutes Intravenous 3 times per day 07/21/13 1610 07/21/13 0919      Assessment/Plan: s/p Procedure(s): AMPUTATION ABOVE KNEE Patient is not improving from surgery.  Not sure what long term goals are.  Wife at bedside and apparently to have a discussion with the palliative care service today.  LOS: 17 days   Marta Lamas. Gae Bon, MD, FACS [phone] [facility] Surgery 08/04/2013

## 2013-08-04 NOTE — Progress Notes (Signed)
Physical Therapy Treatment Patient Details Name: BURTIS IMHOFF MRN: [id number] DOB: [birth year] Today's Date: 08/04/2013 Time: 6213-0865 PT Time Calculation (min): 13 min  PT Assessment / Plan / Recommendation  History of Present Illness 78 yo with ischemic cardiomyopathy, severe PAD admitted with abdominal pain.  CT abdomen demonstrated extensive ischemic changes. Was urgently intubated for shock and respiratory failure and transferred to ICU. Extubated then back to OR for R AKA 12/16 and reintubated. Currently on NRB and was on Bipap earlier today.   PT Comments   Respiratory status remains tenuous.  Follow Up Recommendations  CIR     Does the patient have the potential to tolerate intense rehabilitation     Barriers to Discharge        Equipment Recommendations  None recommended by PT    Recommendations for Other Services Rehab consult;OT consult  Frequency     Progress towards PT Goals Progress towards PT goals: Not progressing toward goals - comment (respiratory status)  Plan Current plan remains appropriate    Precautions / Restrictions Precautions Precautions: Fall Precaution Comments: monitor 02 sats   Pertinent Vitals/Pain See flow sheet.    Mobility       Exercises General Exercises - Upper Extremity Shoulder Flexion: AAROM;Both;10 reps;Supine Shoulder Horizontal ADduction: AAROM;Both;10 reps;Supine General Exercises - Lower Extremity Short Arc Quad: Left;AROM;10 reps;Supine Heel Slides: AAROM;Left;10 reps;Supine Hip ABduction/ADduction: AAROM;Both;10 reps;Supine Amputee Exercises Hip Extension: Supine;10 reps;Right (isometric) Hip ABduction/ADduction: AAROM;Right;10 reps;Supine Straight Leg Raises: AROM;Right;10 reps;Supine   PT Diagnosis:    PT Problem List:   PT Treatment Interventions:     PT Goals (current goals can now be found in the care plan section)    Visit Information  Last PT Received On: 08/04/13 Assistance Needed: +2 History of Present  Illness: 78 yo with ischemic cardiomyopathy, severe PAD admitted with abdominal pain.  CT abdomen demonstrated extensive ischemic changes. Was urgently intubated for shock and respiratory failure and transferred to ICU. Extubated then back to OR for R AKA 12/16 and reintubated. Currently on NRB and was on Bipap earlier today.    Subjective Data      Cognition  Cognition Arousal/Alertness: Awake/alert Behavior During Therapy: WFL for tasks assessed/performed Overall Cognitive Status: Within Functional Limits for tasks assessed    Balance     End of Session PT - End of Session Equipment Utilized During Treatment: Oxygen Activity Tolerance: Patient limited by fatigue Patient left: in bed;with call bell/phone within reach   GP     [facility] 08/04/2013, 2:22 PM  [facility] PT [phone]

## 2013-08-04 NOTE — Progress Notes (Addendum)
[facility]  Subjective  - Resting currently in NAD.   Objective 121/46 73 97.7 F (36.5 C) (Axillary) 22 99%  Intake/Output Summary (Last 24 hours) at 08/04/13 0820 Last data filed at 08/04/13 0500  Gross per 24 hour  Intake 2762.5 ml  Output   3701 ml  Net -938.5 ml    Right AKA incision healing well.  No erythema or drainage.  Assessment/Planning: Right AKA healing well     Thomasena Edis, EMMA [facility] 08/04/2013 8:20 AM --  Laboratory Lab Results:  Recent Labs  08/03/13 0430 08/04/13 0400  WBC 7.2 4.8  HGB 8.5* 8.1*  HCT 25.5* 24.5*  PLT 123* 125*   BMET  Recent Labs  08/02/13 0415 08/03/13 0430  NA 133* 135  K 3.7 3.9  CL 98 98  CO2 31 30  GLUCOSE 115* 114*  BUN 29* 30*  CREATININE 0.72 0.69  CALCIUM 7.7* 7.7*    COAG Lab Results  Component Value Date   INR 1.04 08/03/2013   INR 1.53* 07/26/2013   INR 1.58* 07/25/2013   No results found for this basename: PTT      I have examined the patient, reviewed and agree with above.Denies pain. Right AKA healing. No active vascular issues. Will not follow actively.  Call if we can assist.  Shaunette Gassner, MD 08/04/2013 1:23 PM

## 2013-08-04 NOTE — Progress Notes (Signed)
PULMONARY  / CRITICAL CARE MEDICINE  Name: [name] MRN: [id number] DOB: 03/10/1935    ADMISSION DATE:  07/18/2013 CONSULTATION DATE: 07/21/2013  REFERRING MD :  [facility] PRIMARY SERVICE: PCCM  CHIEF COMPLAINT:  ARF  BRIEF PATIENT DESCRIPTION:  78 yo with ischemic cardiomyopathy, severe PAD admitted with abdominal pain.  CT abdomen demonstrated extensive ischemic changes. Was urgently intubated for shock and respiratory failure and transferred to ICU. Extubated then back to OR for R AKA 12/16 and remains vented again post op.    LINES / TUBES: 12- 8 OTT>>12/14, 12-16 intubated in or>> 12/17>>12-17 12-8 Rt fem cvl>>12-8 12-8 Lt IJ cvl>> R axillary aline 12/8 >> 12/18  CULTURES: 12-8 bc x 2>>ng 12-8 sputum>>e coli S to ceftx / zosyn  ANTIBIOTICS: 12-8 vanc>>12/19 12-8 diflucan>>12/22 12-8 pip tazo>>12/22   SIGNIFICANT EVENTS / STUDIES:  12/8 intubated, shock, surgical evaluation, DNR established, Exploratory laparotomy with entrectomy (majority of jejunum and proximal ileum) 12/9 Segmental small bowel resection terminal ileum  Two anastomoses to re-establish intestinal continuity 12/11 requires higher PEEP/FIO2 12/12 s/p R LE endarterectomy (Dr Early)  12/16 To OR for Rt AKA, returned intubated with 80% fio2 07/30/13 - EXTUBATED  12/18 intermittent NIMVS on 70% NRB 08/02/13: using bipap prn self directed for resp comfort. Good diuresis with lasix but RN says still sounds wet. Still hard to wean off NRB. Otherwise ok. Wants to go home 08/03/13 - ESR 74   SUBJECTIVE/OVERNIGHT/INTERVAL HX Pt remains on bipap.     VITAL SIGNS: Temp:  [97.3 F (36.3 C)-97.7 F (36.5 C)] 97.7 F (36.5 C) (12/22 0400) Pulse Rate:  [70-106] 73 (12/22 0700) Resp:  [15-34] 22 (12/22 0700) BP: (100-128)/(44-69) 121/46 mmHg (12/22 0700) SpO2:  [94 %-100 %] 99 % (12/22 0700) FiO2 (%):  [40 %] 40 % (12/22 0700) Weight:  [76.2 kg (167 lb 15.9 oz)] 76.2 kg (167 lb 15.9 oz) (12/22 0700) INTAKE /  OUTPUT: Intake/Output     12/21 0701 - 12/22 0700 12/22 0701 - 12/23 0700   P.O.     I.V. (mL/kg) 240 (3.1)    IV Piggyback 225    TPN 2420    Total Intake(mL/kg) 2885 (37.9)    Urine (mL/kg/hr) 3800 (2.1)    Stool 1 (0)    Total Output 3801     Net -916            PHYSICAL EXAMINATION: General: no distress Neuro:   Awake, alert, follows commands  HEENT: BiPAP mask in place Cardiovascular: regular Lungs: b/l rales Abdomen: soft, wound dressing clean Musculoskeletal:  1+ edema Lt leg, Rt leg AKA stump clean Skin: no rashes  LABS:  PULMONARY  Recent Labs Lab 07/29/13 1707 07/30/13 0407  PHART 7.420 7.498*  PCO2ART 45.0 38.4  PO2ART 123.0* 76.2*  HCO3 28.1* 29.6*  TCO2 29 30.7  O2SAT 98.0 96.4    CBC  Recent Labs Lab 08/02/13 0415 08/03/13 0430 08/04/13 0400  HGB 8.4* 8.5* 8.1*  HCT 25.2* 25.5* 24.5*  WBC 8.1 7.2 4.8  PLT 105* 123* 125*    COAGULATION  Recent Labs Lab 08/03/13 0430  INR 1.04    CARDIAC  No results found for this basename: TROPONINI,  in the last 168 hours  Recent Labs Lab 08/03/13 0430  PROBNP 8721.0*     CHEMISTRY  Recent Labs Lab 07/30/13 0415 07/31/13 0410 08/01/13 0400 08/02/13 0415 08/03/13 0430 08/04/13 0400  NA 135 134* 134* 133* 135  --   K 3.7 3.5 4.2  3.7 3.9  --   CL 102 100 101 98 98  --   CO2 28 30 28 31 30   --   GLUCOSE 144* 139* 123* 115* 114*  --   BUN 28* 27* 27* 29* 30*  --   CREATININE 0.75 0.70 0.64 0.72 0.69  --   CALCIUM 6.9* 7.3* 7.3* 7.7* 7.7*  --   MG 1.9 1.7  --  1.8 2.1 2.0  PHOS  --  3.5  --   --  3.7 3.9   Estimated Creatinine Clearance: 84.7 ml/min (by C-G formula based on Cr of 0.69).   LIVER  Recent Labs Lab 07/30/13 0415 08/02/13 0415 08/03/13 0430  AST 47* 36  --   ALT 85* 47  --   ALKPHOS 143* 172*  --   BILITOT 1.7* 2.0*  --   PROT 3.8* 4.8*  --   ALBUMIN 1.2* 1.3*  --   INR  --   --  1.04     INFECTIOUS No results found for this basename: LATICACIDVEN,  PROCALCITON,  in the last 168 hours   ENDOCRINE CBG (last 3)   Recent Labs  08/03/13 1719 08/04/13 0006 08/04/13 0423  GLUCAP 153* 207* 151*     Recent Labs Lab 08/03/13 0430  PROBNP 8721.0*        IMAGING x48h  No results found.   ASSESSMENT / PLAN:  PULMONARY A: Acute respiratory failure in setting of ischemic bowel.  Required intubation for Rt AKA 12/14.  Complicated by HCAP. Hx of COPD. Tobacco abuse. Pos I>>O 17L positive P:   - continue lasix at increased dose - empiric IV steroids -scheduled BD's -change to brovana with prn xopenex -continue IS, flutter valve -BiPAP on continuously,  Will need vent/trach if aggressive care to continue  CARDIOVASCULAR A:  Shock- presumed sepsis, SIRS - resolved CAD/ isch cardiomyopathy -EF 35% Vasculopathy -ischemic RLE > s/p R AKA 12/16 NSTEMI Volume overload/ pulm edema  17L pos A fib  08/03/13 - concern for acute amio lung tox  P:  -increase lasix dosing  17L positive -dc amiodarone   RENAL A:   Metabolic acidosis >> resolved.  P:   -f/u renal fx, urine outpt -replace electrolytes as needed  GASTROINTESTINAL A:   SBO/Ischemia s/p extensive SB resection Shock liver -LFTs much improved  H/o antral ulcers  12/21.14 - no appetite. Unable to do his full iquid diet. Stil on  TNA P:   -CCS following -continue TNA  - monitor anorexia  HEMATOLOGIC A:   Thrombocytopenia. Coagulopathy-  With low fibrinogen, low grade DIC picture likely related to ischemic leg >> improved after amputation. P:   -SQ heparin since 12/19 for DVT prevention -Follow CBC  INFECTIOUS A:   E coli PNA. Peritonitis. P:   -day 16 of zosyn, diflucan;  prob need to stop ABX and give a holiday   ENDOCRINE A:   Hyperglycemia. P:   -SSI  NEUROLOGIC A:  Pain control - much improved post AKA. P:   PRN fent for pain  GLOBAL Pt likely will need vent again and trach.  Will get pall care to see the pt and speak to  family again  The patient is critically ill with multiple organ systems failure and requires high complexity decision making for assessment and support, frequent evaluation and titration of therapies, application of advanced monitoring technologies and extensive interpretation of multiple databases.   Critical Care Time devoted to patient care services described in this note is  35  Minutes.  Dorcas Carrow Beeper  [phone]  Cell  [phone]  If no response or cell goes to voicemail, call beeper [phone]   08/04/2013 8:09 AM

## 2013-08-04 NOTE — Significant Event (Signed)
C/o nausea.  Will order prn zofran.  Coralyn Helling, MD 08/04/2013, 2:07 AM

## 2013-08-04 NOTE — Progress Notes (Signed)
PARENTERAL NUTRITION CONSULT NOTE  Pharmacy Consult:  TPN Indication: massive bowel resection  No Known Allergies  Patient Measurements: Height: 6' (182.9 cm) Weight: 167 lb 15.9 oz (76.2 kg) IBW/kg (Calculated) : 77.6  Vital Signs: Temp: 98.3 F (36.8 C) (12/22 0821) Temp src: Oral (12/22 0821) BP: 111/36 mmHg (12/22 0900) Pulse Rate: 42 (12/22 0900) Intake/Output from previous day: 12/21 0701 - 12/22 0700 In: 3117.5 [I.V.:240; IV Piggyback:237.5; TPN:2640] Out: 3801 [Urine:3800; Stool:1] Intake/Output from this shift: Total I/O In: 245 [IV Piggyback:25; TPN:220] Out: -   Labs:  Recent Labs  08/02/13 0415 08/03/13 0430 08/04/13 0400  WBC 8.1 7.2 4.8  HGB 8.4* 8.5* 8.1*  HCT 25.2* 25.5* 24.5*  PLT 105* 123* 125*  INR  --  1.04  --      Recent Labs  08/02/13 0415 08/03/13 0430 08/04/13 0400  NA 133* 135  --   K 3.7 3.9  --   CL 98 98  --   CO2 31 30  --   GLUCOSE 115* 114*  --   BUN 29* 30*  --   CREATININE 0.72 0.69  --   CALCIUM 7.7* 7.7*  --   MG 1.8 2.1 2.0  PHOS  --  3.7 3.9  PROT 4.8*  --   --   ALBUMIN 1.3*  --   --   AST 36  --   --   ALT 47  --   --   ALKPHOS 172*  --   --   BILITOT 2.0*  --   --   TRIG  --   --  106   Estimated Creatinine Clearance: 84.7 ml/min (by C-G formula based on Cr of 0.69).    Recent Labs  08/03/13 1719 08/04/13 0006 08/04/13 0423  GLUCAP 153* 207* 151*     Insulin Requirements in the past 24 hours:  5 units SSI  Admit: [name] admitted with N/V, dehydration, abdominal pain and enteritis d/t norovirus. Found to have mesenteric ischemia and infarcted small bowel. Underwent small bowel resection-majority of jejunum and proximal ileum- currently has minimal amount of small bowel needed to survive.  Patient was started on a full liquid diet but he ate minimally.  Family meeting with palliative care service.  GI: 12/7 EGD showed changes suggestive of severe erosive ulcerative gastritis, duodenitis and  jujinitis - 12/7 CT angiogram suggested diffuse calcifications of mesenteric circulation; 12/8 massive bowel resection - majority of jejunum and proximal ileum. 12/9 resection of terminal ileum. Two anastomoses to re-establish intestinal continuity. Pt pulled out NG tube on 12/15.  PPI IV Endo: No hx DM - CBGs trended up (151-207), ?d/t steroid vs PO intake Lytes: all WNL Renal: SCr 0.69  (stable), CrCL 83 ml/min, excellent UOP on intermittent Lasix Pulm: Hx COPD / tobacco abuse. Extubated 12/14, re-intubated on 12/16 for surgery, now extubated again, BiPAP - Xopenex, Brovana, SM started 12/21 Cards: Hx CAD (CABG '95 + PCI '10) / DL / HTN / PVD / Aflutter. R-AKA done on 12/16 d/t severe PVD. S/p shock - BP normal, variable HR - on amiodarone, intermittent lasix Hepatobil: Shocked liver resolving - AST/ALT normalized, alk phos/tbili trending up slightly, TG down to 140, pre-albumin improved to 6.4 Neuro: Hx CVA/depression. GCS 15 - no sedation.   ID:  Zosyn + Fluconazole for E.coli PNA + high risk of perforation, r/o bacterial translocation. Afebrile, WBC WNL Best Practices: heparin SQ, MC, SCDs, PPI IV TPN Access: LIJ- triple lumen (placed 12/8) TPN day#: 11 (  12/10 >> current)  Current Nutrition:  Full liquid diet ordered - improved PO intake Clinimix E 5/15 at 100 ml/hr and lipid 20% at 10 ml/hr provides 2184 kcal and 120 grams of protein  Nutritional Goals:  2000-2200 kCal, 110-120 grams of protein per day    Plan - Decrease Clinimix E 5/15 to 50 ml/hr per MD request - Decreae IVFE 20% to 5 ml/hr - Daily multivitamin and trace elements - F/U PO intake to d/c TPN  - F/U GoC    [name], PharmD, BCPS Pager:  [phone] 08/04/2013, 9:26 AM

## 2013-08-05 DIAGNOSIS — R531 Weakness: Secondary | ICD-10-CM | POA: Insufficient documentation

## 2013-08-05 DIAGNOSIS — Z515 Encounter for palliative care: Secondary | ICD-10-CM

## 2013-08-05 LAB — CBC WITH DIFFERENTIAL/PLATELET
Basophils Absolute: 0 10*3/uL (ref 0.0–0.1)
Eosinophils Absolute: 0 10*3/uL (ref 0.0–0.7)
Eosinophils Relative: 0 % (ref 0–5)
HCT: 25 % — ABNORMAL LOW (ref 39.0–52.0)
Hemoglobin: 8.5 g/dL — ABNORMAL LOW (ref 13.0–17.0)
Lymphocytes Relative: 8 % — ABNORMAL LOW (ref 12–46)
MCH: 30.5 pg (ref 26.0–34.0)
MCHC: 34 g/dL (ref 30.0–36.0)
MCV: 89.6 fL (ref 78.0–100.0)
Monocytes Absolute: 0.3 10*3/uL (ref 0.1–1.0)
Monocytes Relative: 5 % (ref 3–12)
Platelets: 158 10*3/uL (ref 150–400)
RDW: 18 % — ABNORMAL HIGH (ref 11.5–15.5)
WBC: 6.6 10*3/uL (ref 4.0–10.5)

## 2013-08-05 LAB — GLUCOSE, CAPILLARY
Glucose-Capillary: 145 mg/dL — ABNORMAL HIGH (ref 70–99)
Glucose-Capillary: 144 mg/dL — ABNORMAL HIGH (ref 70–99)

## 2013-08-05 MED ORDER — FAT EMULSION 20 % IV EMUL
250.0000 mL | INTRAVENOUS | Status: AC
  Administered 2013-08-05: 250 mL via INTRAVENOUS
  Filled 2013-08-05: qty 250

## 2013-08-05 MED ORDER — METHYLPREDNISOLONE SODIUM SUCC 40 MG IJ SOLR
40.0000 mg | Freq: Two times a day (BID) | INTRAMUSCULAR | Status: DC
  Administered 2013-08-05 – 2013-08-06 (×2): 40 mg via INTRAVENOUS
  Filled 2013-08-05 (×4): qty 1

## 2013-08-05 MED ORDER — TRACE MINERALS CR-CU-F-FE-I-MN-MO-SE-ZN IV SOLN
INTRAVENOUS | Status: AC
  Administered 2013-08-05: 17:00:00 via INTRAVENOUS
  Filled 2013-08-05: qty 2000

## 2013-08-05 MED ORDER — BIOTENE DRY MOUTH MT LIQD
15.0000 mL | Freq: Two times a day (BID) | OROMUCOSAL | Status: DC
  Administered 2013-08-05 – 2013-08-09 (×9): 15 mL via OROMUCOSAL

## 2013-08-05 MED ORDER — FUROSEMIDE 10 MG/ML IJ SOLN
80.0000 mg | Freq: Three times a day (TID) | INTRAMUSCULAR | Status: DC
  Administered 2013-08-05 – 2013-08-06 (×3): 80 mg via INTRAVENOUS
  Filled 2013-08-05 (×4): qty 8

## 2013-08-05 MED ORDER — PANTOPRAZOLE SODIUM 40 MG PO TBEC
40.0000 mg | DELAYED_RELEASE_TABLET | Freq: Two times a day (BID) | ORAL | Status: DC
  Administered 2013-08-05 – 2013-08-09 (×7): 40 mg via ORAL
  Filled 2013-08-05 (×6): qty 1

## 2013-08-05 NOTE — Progress Notes (Signed)
NUTRITION FOLLOW UP  DOCUMENTATION CODES Per approved criteria  -Severe malnutrition in the context of chronic illness   Intervention:    TPN per pharmacy RD to follow for nutrition care plan  Nutrition Dx:   Inadequate oral intake now related to inability to eat as evidenced by NPO status -- ongoing.  Goal:   TPN to meet > 90% of estimated nutrition needs -- unmet -- MD request to decrease rate.  Monitor:   TPN prescription, PO intake, weight, labs, I/O's  Assessment:   Patient with PMH of COPD, CVA and CAD; previous admitted with enteritis (thought to be Norovirus); presented again with ongoing nausea, occasional vomiting and one loose BM per day; + severe fatigue and abdominal pain  Patient s/p emergent procedure 12/8: EXPLORATORY LAPAROTOMY ENTRECTOMY (MAJORITY OF JEJUNUM & PROXIMAL ILEUM)  Patient s/p procedure 12/16: RIGHT ABOVE-KNEE AMPUTATION  Patient advanced to Regular diet this AM.  Not eating.  BiPAP prn.  Mg, Phos, K+ remain WNL.  Patient to receive TPN with Clinimix E 5/15 @ 50 ml/hr and lipids @ 5 ml/hr. Provides 1092 kcal and 60 grams protein per day. Meets 52% minimum estimated energy needs and 48% minimum estimated protein needs.  Palliative Care Team met with family for goals of care 12/22.  Height: Ht Readings from Last 1 Encounters:  07/18/13 6' (1.829 m)    Weight Status:   Wt Readings from Last 1 Encounters:  08/05/13 162 lb 11.2 oz (73.8 kg)    Re-estimated needs:  Kcal: 2100-2300 Protein: 125-135 gm Fluid: per MD  Skin: Stage II pressure ulcer to sacrum  Diet Order: General   Intake/Output Summary (Last 24 hours) at 08/05/13 0934 Last data filed at 08/05/13 0900  Gross per 24 hour  Intake 1767.58 ml  Output   6477 ml  Net -4709.42 ml    Labs:   Recent Labs Lab 08/01/13 0400 08/02/13 0415 08/03/13 0430 08/04/13 0400 08/05/13 0445  NA 134* 133* 135  --   --   K 4.2 3.7 3.9  --   --   CL 101 98 98  --   --   CO2 28 31  30   --   --   BUN 27* 29* 30*  --   --   CREATININE 0.64 0.72 0.69  --   --   CALCIUM 7.3* 7.7* 7.7*  --   --   MG  --  1.8 2.1 2.0 1.8  PHOS  --   --  3.7 3.9 4.6  GLUCOSE 123* 115* 114*  --   --     CBG (last 3)   Recent Labs  08/04/13 1217 08/04/13 1711 08/04/13 2333  GLUCAP 142* 140* 144*    Scheduled Meds: . antiseptic oral rinse  15 mL Mouth Rinse QID  . arformoterol  15 mcg Nebulization BID  . chlorhexidine  15 mL Mouth Rinse BID  . furosemide  80 mg Intravenous Q8H  . heparin subcutaneous  5,000 Units Subcutaneous Q8H  . insulin aspart  0-9 Units Subcutaneous Q6H  . methylPREDNISolone (SOLU-MEDROL) injection  40 mg Intravenous Q12H  . pantoprazole  40 mg Oral BID AC  . sertraline  100 mg Oral Daily  . sodium chloride  10 mL Intravenous Q12H    Continuous Infusions: . sodium chloride 20 mL/hr at 08/05/13 0400  . Marland KitchenTPN (CLINIMIX-E) Adult 50 mL/hr at 08/05/13 0400   And  . fat emulsion 250 mL (08/05/13 0400)    Maureen Chatters, RD, LDN Pager #:  [phone] After-Hours Pager #: [phone]

## 2013-08-05 NOTE — Progress Notes (Signed)
Patient still not eating much.  Has TPN going.  Palliative care consultation completed.  Sats are 100% on NRB mask.  No continuing issues from surgery standpoint.  This is mostly a family issue involving continuation of care and long term expectations.   The patient has apparently expressed his wishes to die.  I am not sure how this can be overlooked by the family.  We have nothing else to offer from the surgical standpoint.  Marta Lamas. Gae Bon, MD, FACS [phone] [facility] Surgery

## 2013-08-05 NOTE — Progress Notes (Signed)
PARENTERAL NUTRITION CONSULT NOTE  Pharmacy Consult:  TPN Indication: massive bowel resection  No Known Allergies  Patient Measurements: Height: 6' (182.9 cm) Weight: 162 lb 11.2 oz (73.8 kg) IBW/kg (Calculated) : 77.6  Vital Signs: Temp: 97.8 F (36.6 C) (12/23 0750) Temp src: Oral (12/23 0750) BP: 126/47 mmHg (12/23 0700) Pulse Rate: 76 (12/23 0700) Intake/Output from previous day: 12/22 0701 - 12/23 0700 In: 1862.6 [I.V.:420; IV Piggyback:25; TPN:1417.6] Out: 6453 [Urine:6450; Stool:3] Intake/Output from this shift:    Labs:  Recent Labs  08/03/13 0430 08/04/13 0400 08/05/13 0445  WBC 7.2 4.8 6.6  HGB 8.5* 8.1* 8.5*  HCT 25.5* 24.5* 25.0*  PLT 123* 125* 158  INR 1.04  --   --      Recent Labs  08/03/13 0430 08/04/13 0400 08/05/13 0445  NA 135  --   --   K 3.9  --   --   CL 98  --   --   CO2 30  --   --   GLUCOSE 114*  --   --   BUN 30*  --   --   CREATININE 0.69  --   --   CALCIUM 7.7*  --   --   MG 2.1 2.0 1.8  PHOS 3.7 3.9 4.6  PREALBUMIN  --  8.5*  --   TRIG  --  106  --    Estimated Creatinine Clearance: 82 ml/min (by C-G formula based on Cr of 0.69).    Recent Labs  08/04/13 1217 08/04/13 1711 08/04/13 2333  GLUCAP 142* 140* 144*     Insulin Requirements in the past 12 hours:  4 units SSI  Admit: [name] admitted with N/V, dehydration, abdominal pain and enteritis d/t norovirus. Found to have mesenteric ischemia and infarcted small bowel. Underwent small bowel resection-majority of jejunum and proximal ileum- currently has minimal amount of small bowel needed to survive.  Patient was started on a full liquid diet but he ate minimally.  Family meeting with palliative care service.  GI: 12/7 EGD showed changes suggestive of severe erosive ulcerative gastritis, duodenitis and jujinitis - 12/7 CT angiogram suggested diffuse calcifications of mesenteric circulation; 12/8 massive bowel resection - majority of jejunum and proximal ileum. 12/9  resection of terminal ileum. Two anastomoses to re-establish intestinal continuity. Pt pulled out NG tube on 12/15.  Surgery signed off on 12/23 -- nothing else to offer from a surgical standpoint. TPN rate reduced on 12/22 per MD request to help with appetitive. Continues on PPI  Endo: No hx DM - CBGs/12h: 140-144 Lytes: No BMET since 12/21, lytes okay at that time, Phos 4.6, Mg 1.8 Renal: SCr 0.69  (stable), CrCl 80-90 ml/min, UOP/24h: 3.5 ml/kg/hr intermittent Lasix Pulm: Hx COPD / tobacco abuse. Extubated 12/14, re-intubated on 12/16 for surgery, now extubated again. 100% on non-rebreather at 15%. On brovana, solumedrol 40 mg/12h (red 12/23), prn xopenex Cards: Hx CAD (CABG '95 + PCI '10) / DL / HTN / PVD / Aflutter. R-AKA done on 12/16 d/t severe PVD. S/p shock - BP normal, variable HR - on amiodarone, intermittent lasix (red 12/23) Hepatobil: Shocked liver resolving - AST/ALT normalized, alk phos/tbili trending up slightly, TG down to 106 (12/22), pre-albumin improved slightly to 8.5 (12/22) <<  6.4 (12/15) Neuro: Hx CVA/depression. GCS 15 - no sedation.   ID:  Abx d/ced 12/22 -- s/p treatment for E. coli PNA and peritonitis. Afebrile, WBC WNL Best Practices: heparin SQ, MC, SCDs, PPI IV TPN Access: LIJ-  triple lumen (placed 12/8) TPN day#: 13 (12/10 >> current)  Current Nutrition:  Regular diet -- started 12/23; per RN only ate 4 bites of breakfast Clinimix E 5/15 at 50 ml/hr and lipid 20% at 5 ml/hr provides 1092 kcal and 60 grams of protein  Nutritional Goals:  2000-2200 kCal, 110-120 grams of protein per day  Plan - Continue Clinimix E 5/15 at 50 ml/hr per MD request - Continue IVFE 20% to 5 ml/hr - Daily multivitamin and trace elements - F/U PO intake to d/c TPN  - F/U goals of care  Georgina Pillion, PharmD, BCPS Clinical Pharmacist Pager: [phone] 08/05/2013 9:33 AM

## 2013-08-05 NOTE — Progress Notes (Signed)
PULMONARY  / CRITICAL CARE MEDICINE  Name: [name] MRN: [id number] DOB: 07/05/1935    ADMISSION DATE:  07/18/2013 CONSULTATION DATE: 07/21/2013  REFERRING MD :  [facility] PRIMARY SERVICE: PCCM  CHIEF COMPLAINT:  ARF  BRIEF PATIENT DESCRIPTION:  78 yo with ischemic cardiomyopathy, severe PAD admitted with abdominal pain.  CT abdomen demonstrated extensive ischemic changes. Was urgently intubated for shock and respiratory failure and transferred to ICU. Extubated then back to OR for R AKA 12/16 and remains vented again post op.    LINES / TUBES: 12- 8 OTT>>12/14, 12-16 intubated in or>> 12/17>>12-17 12-8 Rt fem cvl>>12-8 12-8 Lt IJ cvl>> R axillary aline 12/8 >> 12/18  CULTURES: 12-8 bc x 2>>ng 12-8 sputum>>e coli S to ceftx / zosyn  ANTIBIOTICS: 12-8 vanc>>12/19 12-8 diflucan>>12/22 12-8 pip tazo>>12/22   SIGNIFICANT EVENTS / STUDIES:  12/8 intubated, shock, surgical evaluation, DNR established, Exploratory laparotomy with entrectomy (majority of jejunum and proximal ileum) 12/9 Segmental small bowel resection terminal ileum  Two anastomoses to re-establish intestinal continuity 12/11 requires higher PEEP/FIO2 12/12 s/p R LE endarterectomy (Dr Early)  12/16 To OR for Rt AKA, returned intubated with 80% fio2 07/30/13 - EXTUBATED  12/18 intermittent NIMVS on 70% NRB 08/02/13: using bipap prn self directed for resp comfort. Good diuresis with lasix but RN says still sounds wet. Still hard to wean off NRB. Otherwise ok. Wants to go home 08/03/13 - ESR 74   SUBJECTIVE/OVERNIGHT/INTERVAL HX Improved with diuresis.  Off bipap    VITAL SIGNS: Temp:  [97.5 F (36.4 C)-98.2 F (36.8 C)] 97.8 F (36.6 C) (12/23 0750) Pulse Rate:  [42-90] 80 (12/23 0800) Resp:  [10-31] 30 (12/23 0800) BP: (106-129)/(36-88) 128/51 mmHg (12/23 0800) SpO2:  [96 %-100 %] 100 % (12/23 0800) FiO2 (%):  [40 %] 40 % (12/22 1416) Weight:  [73.8 kg (162 lb 11.2 oz)] 73.8 kg (162 lb 11.2 oz) (12/23  0500) INTAKE / OUTPUT: Intake/Output     12/22 0701 - 12/23 0700 12/23 0701 - 12/24 0700   I.V. (mL/kg) 420 (5.7)    IV Piggyback 25    TPN 1417.6    Total Intake(mL/kg) 1862.6 (25.2)    Urine (mL/kg/hr) 6450 (3.6)    Stool 3 (0)    Total Output 6453     Net -4590.4            PHYSICAL EXAMINATION: General: no distress, off bipap Neuro:   Awake, alert, follows commands  HEENT: mild jvd Cardiovascular: regular Lungs: clearer, decreased rales Abdomen: soft, wound dressing clean, tender RLQ Musculoskeletal:  1+ edema Lt leg, Rt leg AKA stump clean Skin: no rashes  LABS:  PULMONARY  Recent Labs Lab 07/29/13 1707 07/30/13 0407  PHART 7.420 7.498*  PCO2ART 45.0 38.4  PO2ART 123.0* 76.2*  HCO3 28.1* 29.6*  TCO2 29 30.7  O2SAT 98.0 96.4    CBC  Recent Labs Lab 08/03/13 0430 08/04/13 0400 08/05/13 0445  HGB 8.5* 8.1* 8.5*  HCT 25.5* 24.5* 25.0*  WBC 7.2 4.8 6.6  PLT 123* 125* 158    COAGULATION  Recent Labs Lab 08/03/13 0430  INR 1.04    CARDIAC  No results found for this basename: TROPONINI,  in the last 168 hours  Recent Labs Lab 08/03/13 0430  PROBNP 8721.0*     CHEMISTRY  Recent Labs Lab 07/30/13 0415 07/31/13 0410 08/01/13 0400 08/02/13 0415 08/03/13 0430 08/04/13 0400 08/05/13 0445  NA 135 134* 134* 133* 135  --   --  K 3.7 3.5 4.2 3.7 3.9  --   --   CL 102 100 101 98 98  --   --   CO2 28 30 28 31 30   --   --   GLUCOSE 144* 139* 123* 115* 114*  --   --   BUN 28* 27* 27* 29* 30*  --   --   CREATININE 0.75 0.70 0.64 0.72 0.69  --   --   CALCIUM 6.9* 7.3* 7.3* 7.7* 7.7*  --   --   MG 1.9 1.7  --  1.8 2.1 2.0 1.8  PHOS  --  3.5  --   --  3.7 3.9 4.6   Estimated Creatinine Clearance: 82 ml/min (by C-G formula based on Cr of 0.69).   LIVER  Recent Labs Lab 07/30/13 0415 08/02/13 0415 08/03/13 0430  AST 47* 36  --   ALT 85* 47  --   ALKPHOS 143* 172*  --   BILITOT 1.7* 2.0*  --   PROT 3.8* 4.8*  --   ALBUMIN 1.2* 1.3*   --   INR  --   --  1.04     INFECTIOUS No results found for this basename: LATICACIDVEN, PROCALCITON,  in the last 168 hours   ENDOCRINE CBG (last 3)   Recent Labs  08/04/13 1217 08/04/13 1711 08/04/13 2333  GLUCAP 142* 140* 144*     Recent Labs Lab 08/03/13 0430  PROBNP 8721.0*        IMAGING x48h  No results found.   ASSESSMENT / PLAN: Principal Problem:   Intestinal ischemia and infarction Active Problems:   COPD   HTN (hypertension)   PVD (peripheral vascular disease) with claudication   Enteritis due to Norovirus   Systolic CHF   Dehydration   Abdominal pain, epigastric   Nausea with vomiting   Gastric ulcer   Nausea and vomiting in adult   Hypokalemia   Partial gastric outlet obstruction   Protein-calorie malnutrition, severe   Gastroparesis   Other specified gastritis without mention of hemorrhage   Acute respiratory failure   Hypovolemic shock   E. coli pneumonia   Palliative care encounter   PULMONARY A: Acute respiratory failure in setting of ischemic bowel.  Required intubation for Rt AKA 12/14.  Complicated by HCAP. Hx of COPD. Tobacco abuse. Pos I>>O 17L positive>>Neg 4L 12/22 with diuresis. Also TPN induced met acidosis and volume excess  P:   - continue lasix at increased dose but reduce freq to q8h - empiric IV steroids>>taper dose -scheduled BD's -change to brovana with prn xopenex -continue IS, flutter valve -bipap prn - see below:  Now DNR DNI  CARDIOVASCULAR A:  CAD/ isch cardiomyopathy -EF 35% Vasculopathy -ischemic RLE > s/p R AKA 12/16 NSTEMI Volume overload/ pulm edema  17L pos>>improved from 12/22 A fib Off amiodarone over amio toxicity concern>>doubt  P:  -cont increase lasix dosing  -leave off  amiodarone for now  RENAL A:   Volume excess P:   -cont diuresis -f/u renal fx, urine outpt -replace electrolytes as needed  GASTROINTESTINAL A:   SBO/Ischemia s/p extensive SB resection Shock  liver -LFTs much improved  H/o antral ulcers Concern for prob ongoing abd ischemia  P:   -CCS following -continue TNA at reduced dose - monitor anorexia  HEMATOLOGIC A:   Thrombocytopenia. Coagulopathy-  With low fibrinogen, low grade DIC picture likely related to ischemic leg >> improved after amputation. P:   -SQ heparin since 12/19 for DVT prevention -  Follow CBC  INFECTIOUS A:   E coli PNA.resolved Peritonitis. improved P:   -off all ABX for now recult if spikes  ENDOCRINE A:   Hyperglycemia. improved P:   -SSI  NEUROLOGIC A:  Pain control - much improved post AKA. P:   PRN fent for pain  GLOBAL Pt improved 12/23.  I spoke to family at length, including daughter and wife. They are having a hard time processing that this pt is nearing end of life.  They apprec pall care efforts.  They are in agreement that we will continue medical care, icu monitoring, bipap prn, diuresis, nutrition.  However, we DO NOT PLAN REINTUBATION , TRACH, VENT, CPR, DCCV.   We will monitor his progress in this regard.   The patient is critically ill with multiple organ systems failure and requires high complexity decision making for assessment and support, frequent evaluation and titration of therapies, application of advanced monitoring technologies and extensive interpretation of multiple databases.   Critical Care Time devoted to patient care services described in this note is  35  Minutes.  Dorcas Carrow Beeper  [phone]  Cell  [phone]  If no response or cell goes to voicemail, call beeper [phone]   08/05/2013 8:58 AM

## 2013-08-06 ENCOUNTER — Inpatient Hospital Stay (HOSPITAL_COMMUNITY): Payer: Medicare Other

## 2013-08-06 LAB — BASIC METABOLIC PANEL
BUN: 45 mg/dL — ABNORMAL HIGH (ref 6–23)
CO2: 38 mEq/L — ABNORMAL HIGH (ref 19–32)
CO2: 37 mEq/L — ABNORMAL HIGH (ref 19–32)
Calcium: 7.7 mg/dL — ABNORMAL LOW (ref 8.4–10.5)
Calcium: 7.7 mg/dL — ABNORMAL LOW (ref 8.4–10.5)
Chloride: 94 mEq/L — ABNORMAL LOW (ref 96–112)
GFR calc non Af Amer: 87 mL/min — ABNORMAL LOW (ref >90)
GFR calc non Af Amer: 86 mL/min — ABNORMAL LOW (ref >90)
Glucose, Bld: 91 mg/dL (ref 70–99)
Potassium: 2.8 mEq/L — ABNORMAL LOW (ref 3.5–5.1)
Potassium: 3.7 mEq/L (ref 3.5–5.1)
Sodium: 139 mEq/L (ref 135–145)
Sodium: 137 mEq/L (ref 135–145)

## 2013-08-06 LAB — GLUCOSE, CAPILLARY

## 2013-08-06 LAB — PHOSPHORUS: Phosphorus: 3.8 mg/dL (ref 2.3–4.6)

## 2013-08-06 MED ORDER — POTASSIUM CHLORIDE 10 MEQ/50ML IV SOLN
10.0000 meq | INTRAVENOUS | Status: AC
  Administered 2013-08-06 (×2): 10 meq via INTRAVENOUS
  Filled 2013-08-06: qty 50

## 2013-08-06 MED ORDER — ALUM & MAG HYDROXIDE-SIMETH 200-200-20 MG/5ML PO SUSP
15.0000 mL | Freq: Four times a day (QID) | ORAL | Status: DC | PRN
  Administered 2013-08-06: 15 mL via ORAL
  Filled 2013-08-06: qty 30

## 2013-08-06 MED ORDER — FUROSEMIDE 10 MG/ML IJ SOLN
60.0000 mg | Freq: Two times a day (BID) | INTRAMUSCULAR | Status: DC
  Administered 2013-08-06 – 2013-08-08 (×4): 60 mg via INTRAVENOUS
  Filled 2013-08-06 (×4): qty 6

## 2013-08-06 MED ORDER — AMIODARONE HCL 200 MG PO TABS
200.0000 mg | ORAL_TABLET | Freq: Every day | ORAL | Status: DC
  Administered 2013-08-06 – 2013-08-09 (×4): 200 mg via ORAL
  Filled 2013-08-06 (×4): qty 1

## 2013-08-06 MED ORDER — POTASSIUM CHLORIDE 10 MEQ/50ML IV SOLN
INTRAVENOUS | Status: AC
  Administered 2013-08-06: 10 meq
  Filled 2013-08-06: qty 50

## 2013-08-06 MED ORDER — POTASSIUM CHLORIDE 10 MEQ/50ML IV SOLN
10.0000 meq | INTRAVENOUS | Status: AC
  Administered 2013-08-06 (×6): 10 meq via INTRAVENOUS
  Filled 2013-08-06 (×8): qty 50

## 2013-08-06 MED ORDER — AMIODARONE HCL 200 MG PO TABS: 200.0000 mg | ORAL_TABLET | Freq: Every day | ORAL | Status: DC

## 2013-08-06 NOTE — Progress Notes (Signed)
Not much to add.  Patient needs PT and I will order.  Otherwise will have abdominal staples removed.  Advance diet as tolerated for short gut syndrome.  Marta Lamas. Gae Bon, MD, FACS [phone] [facility] Surgery

## 2013-08-06 NOTE — Progress Notes (Signed)
Patient transferred to 4E26. CSW reported off to the social worker who will help assist with disposition, if needed.  Maree Krabbe, MSW, Theresia Majors [phone]

## 2013-08-06 NOTE — Progress Notes (Signed)
Patient transferred to unit.  Vital signs stable, alert and oriented x4.  States he is not experiencing SOB or pain at this time.  Will continue to monitor.

## 2013-08-06 NOTE — Progress Notes (Signed)
Physical Therapy Treatment Patient Details Name: [name] MRN: [id number] DOB: 01-14-1935 Today's Date: 08/06/2013 Time: 4098-1191 PT Time Calculation (min): 28 min  PT Assessment / Plan / Recommendation  History of Present Illness 77 yo with ischemic cardiomyopathy, severe PAD admitted with abdominal pain.  CT abdomen demonstrated extensive ischemic changes. Was urgently intubated for shock and respiratory failure and transferred to ICU. Extubated then back to OR for R AKA 12/16 and reintubated. Currently on NRB and was on Bipap earlier today.   PT Comments   Pt did surprisingly well with incr activity. On 3L O2 with SaO2 down to 87% at EOB; incr O2 to 4L and pt increased (with instruction in deep breathing) to 92-93%.    Follow Up Recommendations  CIR     Does the patient have the potential to tolerate intense rehabilitation     Barriers to Discharge        Equipment Recommendations  None recommended by PT    Recommendations for Other Services Rehab consult;OT consult  Frequency Min 3X/week   Progress towards PT Goals Progress towards PT goals: Progressing toward goals  Plan Other (comment) (will need to see if can tolerate intensity of CIR)    Precautions / Restrictions Precautions Precautions: Fall;Other (comment) Precaution Comments: DNR/DNI watch resp status closely   Pertinent Vitals/Pain SaO2 94% on 3L; down to 87% with sitting on 3L; incr to 4L with 92-93%  RR 15-20     Mobility  Bed Mobility Supine to Sit: 1: +2 Total assist;HOB elevated Supine to Sit: Patient Percentage: 30% Sitting - Scoot to Edge of Bed: 3: Mod assist Sit to Supine: 1: +2 Total assist;HOB flat Sit to Supine: Patient Percentage: 30% Scooting to HOB: 1: +2 Total assist Scooting to [facility]: Patient Percentage: 0% Details for Bed Mobility Assistance: vc for sequencing; pt very motivated and trying to assist (made more difficult by ICU air bed Transfers Transfers: Not assessed Sit to Stand:  Not tested (comment)    Exercises General Exercises - Upper Extremity Shoulder Flexion: AROM;Both;5 reps;Supine (unilaterally) Shoulder Extension: Strengthening;Both;Other reps (comment);Supine (isometric; 3 reps) Elbow Extension: Strengthening;Both;5 reps;Supine (isometric) Digit Composite Flexion: AROM;Both;10 reps;Supine Composite Extension: AROM;Both;10 reps;Supine Chair Push Up: Other (comment) (attempted lateral scoot at EOB, pt unable to unweight pelvis) General Exercises - Lower Extremity Ankle Circles/Pumps: AROM;Left;10 reps;Supine Gluteal Sets: AROM;Both;5 reps;Supine Long Arc Quad: AROM;Left;10 reps;Seated Heel Slides: AROM;Strengthening;Left;5 reps;Supine (resisted extension) Amputee Exercises Hip Extension: AROM;Right;10 reps;Supine Straight Leg Raises: AROM;Right;10 reps;Supine Other Exercises Other Exercises: Exercises performed at bed level first with pt tolerating very well; pt given 30 minute rest and then returned for EOB activities with +2 assist; time in/out adjusted to reflect total time   PT Diagnosis:    PT Problem List:   PT Treatment Interventions:     PT Goals (current goals can now be found in the care plan section)    Visit Information  Last PT Received On: 08/06/13 Assistance Needed: +2 History of Present Illness: 78 yo with ischemic cardiomyopathy, severe PAD admitted with abdominal pain.  CT abdomen demonstrated extensive ischemic changes. Was urgently intubated for shock and respiratory failure and transferred to ICU. Extubated then back to OR for R AKA 12/16 and reintubated. Currently on NRB and was on Bipap earlier today.    Subjective Data  Subjective: "do you think they burned my other leg?" pt joking re: Rt AKA   Cognition  Cognition Arousal/Alertness: Awake/alert Behavior During Therapy: WFL for tasks assessed/performed Overall Cognitive Status: Within Functional  Limits for tasks assessed    Balance  Static Sitting Balance Static  Sitting - Balance Support: Bilateral upper extremity supported;Feet supported Static Sitting - Level of Assistance: 4: Min assist Static Sitting - Comment/# of Minutes: kyphotic (able to mostly reverse with cues, however fatigues and sinks back down into flexion); EOB ~8 minutes; tolerated min resistance for abdominals and back extensors while maintaining his balance; LLE LAQ with min assist to torso for balance  End of Session PT - End of Session Equipment Utilized During Treatment: Oxygen Activity Tolerance: Patient limited by fatigue Patient left: in bed;with call bell/phone within reach;with family/visitor present Nurse Communication: Mobility status   GP     [name] 08/06/2013, 12:01 PM Pager [phone]

## 2013-08-06 NOTE — Progress Notes (Signed)
PULMONARY  / CRITICAL CARE MEDICINE  Name: [name] MRN: [id number] DOB: 11/07/1934    ADMISSION DATE:  07/18/2013 CONSULTATION DATE: 07/21/2013  REFERRING MD :  [facility] PRIMARY SERVICE: PCCM  CHIEF COMPLAINT:  ARF  BRIEF PATIENT DESCRIPTION:  78 yo with ischemic cardiomyopathy, severe PAD admitted with abdominal pain.  CT abdomen demonstrated extensive ischemic changes. Was urgently intubated for shock and respiratory failure and transferred to ICU. Extubated then back to OR for R AKA 12/16 and remains vented again post op.    LINES / TUBES: 12- 8 OTT>>12/14, 12-16 intubated in or>> 12/17>>12-17 12-8 Rt fem cvl>>12-8 12-8 Lt IJ cvl>> R axillary aline 12/8 >> 12/18  CULTURES: 12-8 bc x 2>>ng 12-8 sputum>>e coli S to ceftx / zosyn  ANTIBIOTICS: 12-8 vanc>>12/19 12-8 diflucan>>12/22 12-8 pip tazo>>12/22   SIGNIFICANT EVENTS / STUDIES:  12/8 intubated, shock, surgical evaluation, DNR established, Exploratory laparotomy with entrectomy (majority of jejunum and proximal ileum) 12/9 Segmental small bowel resection terminal ileum  Two anastomoses to re-establish intestinal continuity 12/11 requires higher PEEP/FIO2 12/12 s/p R LE endarterectomy (Dr Early)  12/16 To OR for Rt AKA, returned intubated with 80% fio2 07/30/13 - EXTUBATED  12/18 intermittent NIMVS on 70% NRB 08/02/13: using bipap prn self directed for resp comfort. Good diuresis with lasix but RN says still sounds wet. Still hard to wean off NRB. Otherwise ok. Wants to go home 08/03/13 - ESR 74   SUBJECTIVE/OVERNIGHT/INTERVAL HX Improved with diuresis.  Off bipap    VITAL SIGNS: Temp:  [97.4 F (36.3 C)-98.3 F (36.8 C)] 97.9 F (36.6 C) (12/24 0743) Pulse Rate:  [75-93] 81 (12/24 0700) Resp:  [11-39] 22 (12/24 0700) BP: (109-131)/(39-66) 123/52 mmHg (12/24 0700) SpO2:  [92 %-100 %] 95 % (12/24 0700) Weight:  [71.3 kg (157 lb 3 oz)] 71.3 kg (157 lb 3 oz) (12/24 0445) INTAKE / OUTPUT: Intake/Output      12/23 0701 - 12/24 0700 12/24 0701 - 12/25 0700   P.O. 150    I.V. (mL/kg) 460 (6.5)    IV Piggyback 50    TPN 1320    Total Intake(mL/kg) 1980 (27.8)    Urine (mL/kg/hr) 4226 (2.5)    Stool 2 (0)    Total Output 4228     Net -2248            PHYSICAL EXAMINATION: General: no distress, off bipap Neuro:   Awake, alert, follows commands  HEENT: mild jvd Cardiovascular: regular Lungs: clearer, decreased rales Abdomen: soft, wound dressing clean, tender RLQ Musculoskeletal:  1+ edema Lt leg, Rt leg AKA stump clean Skin: no rashes  LABS:  PULMONARY No results found for this basename: PHART, PCO2, PCO2ART, PO2, PO2ART, HCO3, TCO2, O2SAT,  in the last 168 hours  CBC  Recent Labs Lab 08/03/13 0430 08/04/13 0400 08/05/13 0445  HGB 8.5* 8.1* 8.5*  HCT 25.5* 24.5* 25.0*  WBC 7.2 4.8 6.6  PLT 123* 125* 158    COAGULATION  Recent Labs Lab 08/03/13 0430  INR 1.04    CARDIAC  No results found for this basename: TROPONINI,  in the last 168 hours  Recent Labs Lab 08/03/13 0430  PROBNP 8721.0*     CHEMISTRY  Recent Labs Lab 07/31/13 0410 08/01/13 0400 08/02/13 0415 08/03/13 0430 08/04/13 0400 08/05/13 0445 08/06/13 0430  NA 134* 134* 133* 135  --   --  139  K 3.5 4.2 3.7 3.9  --   --  2.8*  CL 100 101 98 98  --   --  94*  CO2 30 28 31 30   --   --  38*  GLUCOSE 139* 123* 115* 114*  --   --  131*  BUN 27* 27* 29* 30*  --   --  45*  CREATININE 0.70 0.64 0.72 0.69  --   --  0.74  CALCIUM 7.3* 7.3* 7.7* 7.7*  --   --  7.7*  MG 1.7  --  1.8 2.1 2.0 1.8 1.9  PHOS 3.5  --   --  3.7 3.9 4.6 3.8   Estimated Creatinine Clearance: 79.2 ml/min (by C-G formula based on Cr of 0.74).   LIVER  Recent Labs Lab 08/02/13 0415 08/03/13 0430  AST 36  --   ALT 47  --   ALKPHOS 172*  --   BILITOT 2.0*  --   PROT 4.8*  --   ALBUMIN 1.3*  --   INR  --  1.04     INFECTIOUS No results found for this basename: LATICACIDVEN, PROCALCITON,  in the last 168  hours   ENDOCRINE CBG (last 3)   Recent Labs  08/05/13 1235 08/05/13 1545 08/05/13 2244  GLUCAP 145* 145* 144*     Recent Labs Lab 08/03/13 0430  PROBNP 8721.0*        IMAGING x48h  Dg Chest Port 1 View  08/06/2013   CLINICAL DATA:  Pulmonary edema  EXAM: PORTABLE CHEST - 1 VIEW  COMPARISON:  08/02/2013.  FINDINGS: The cardiac silhouette is enlarged. And left internal jugular central venous catheter is appreciated with tip projecting in the region superior vena cava. Patient is status post median sternotomy and coronary artery bypass grafting. Atherosclerotic calcifications identified within the aorta. Diffuse bilateral pulmonary opacities are appreciated with greatest confluence of the right lung base. When compared to the previous study there has been decreased density and conspicuity of the findings in the right lung base. There is a consolidative component within the right lung base may represent a component of a small effusion. The osseous structures unremarkable.  IMPRESSION: Slightly more prominent interstitial infiltrate again consistent with pulmonary edema. Focal infiltrate versus atelectasis right lung base is well small effusion. Continued surveillance evaluation recommended. Support line unchanged.   Electronically Signed   By: Salome Holmes M.D.   On: 08/06/2013 08:05     ASSESSMENT / PLAN: Principal Problem:   Intestinal ischemia and infarction Active Problems:   COPD   HTN (hypertension)   PVD (peripheral vascular disease) with claudication   Enteritis due to Norovirus   Systolic CHF   Dehydration   Abdominal pain, epigastric   Nausea with vomiting   Gastric ulcer   Nausea and vomiting in adult   Hypokalemia   Partial gastric outlet obstruction   Protein-calorie malnutrition, severe   Gastroparesis   Other specified gastritis without mention of hemorrhage   Acute respiratory failure   Hypovolemic shock   E. coli pneumonia   Palliative care  encounter   Weakness generalized   PULMONARY A: Acute respiratory failure in setting of ischemic bowel.  Required intubation for Rt AKA 12/14.  Complicated by HCAP. RESOLVED Hx of COPD. Tobacco abuse. Pos I>>O 17L positive>>Neg 6L 12/22 with diuresis. Also TPN induced met acidosis and volume excess  P:   -reduce lasix -d/c bipap -tfr to tele -needs mobility /PT  CARDIOVASCULAR A:  CAD/ isch cardiomyopathy -EF 35% Vasculopathy -ischemic RLE > s/p R AKA 12/16 NSTEMI Volume overload/ pulm edema  17L pos>>improved 12/24 A fib Off amiodarone over amio toxicity  concern>>doubt so resume amio  P:  -cont lasix but at reduced dose -resume amiodarone  RENAL A:   Volume excess better hypokalemia P:   -cont diuresis at reduced dose -K repleted -f/u renal fx, urine outpt -replace electrolytes as needed  GASTROINTESTINAL A:   SBO/Ischemia s/p extensive SB resection Shock liver -LFTs much improved  H/o antral ulcers Concern for prob ongoing abd ischemia>>now improved with resolved abd pain  P:   -CCS following -adv diet -d/c tpn after current bag infused - monitor anorexia  HEMATOLOGIC A:   Thrombocytopenia. Coagulopathy-  With low fibrinogen, low grade DIC picture likely related to ischemic leg >> improved after amputation. P:   -SQ heparin since 12/19 for DVT prevention -Follow CBC  INFECTIOUS A:   E coli PNA.resolved Peritonitis. improved P:   -off all ABX for now recult if spikes  ENDOCRINE A:   Hyperglycemia. improved P:   -SSI  NEUROLOGIC A:  Pain control - much improved post AKA. P:   PRN fent for pain  GLOBAL Pt improved 12/24.  We will continue medical care,  diuresis, nutrition.  However, we DO NOT PLAN REINTUBATION , TRACH, VENT, CPR, DCCV.   Pt well enough for tele tfr.  Needs PT.   Plan to [facility] team and PCCM will sign off .    The patient is critically ill with multiple organ systems failure and requires high complexity  decision making for assessment and support, frequent evaluation and titration of therapies, application of advanced monitoring technologies and extensive interpretation of multiple databases.   Critical Care Time devoted to patient care services described in this note is  35  Minutes.  Dorcas Carrow Beeper  [phone]  Cell  [phone]  If no response or cell goes to voicemail, call beeper [phone]   08/06/2013 9:49 AM

## 2013-08-06 NOTE — Progress Notes (Signed)
Progress Note from the Palliative Medicine Team at [facility]  Subjective:  - patient is alert and orietned X2, wife at bedside, pateitn engagaged in conversation regarding his Christmas tree and holiday season     Objective: No Known Allergies Scheduled Meds: . amiodarone  200 mg Oral Daily  . antiseptic oral rinse  15 mL Mouth Rinse BID  . arformoterol  15 mcg Nebulization BID  . furosemide  60 mg Intravenous Q12H  . heparin subcutaneous  5,000 Units Subcutaneous Q8H  . insulin aspart  0-9 Units Subcutaneous Q6H  . pantoprazole  40 mg Oral BID AC  . potassium chloride  10 mEq Intravenous Q1H  . potassium chloride      . sertraline  100 mg Oral Daily  . sodium chloride  10 mL Intravenous Q12H   Continuous Infusions: . sodium chloride 20 mL/hr at 08/05/13 0400  . Marland KitchenTPN (CLINIMIX-E) Adult 50 mL/hr at 08/05/13 1720   And  . fat emulsion 250 mL (08/05/13 1719)   PRN Meds:.ALPRAZolam, fentaNYL, levalbuterol, ondansetron, sodium chloride  BP 110/52  Pulse 70  Temp(Src) 97.5 F (36.4 C) (Oral)  Resp 20  Ht 6' (1.829 m)  Wt 67.9 kg (149 lb 11.1 oz)  BMI 20.30 kg/m2  SpO2 94%   PPS: 30 %    Intake/Output Summary (Last 24 hours) at 08/06/13 1330 Last data filed at 08/06/13 1200  Gross per 24 hour  Intake   1895 ml  Output   4438 ml  Net  -2543 ml        Physical Exam:  General: ill appearing, NAD, more alert and engaged today  HEENT: Dry buccal membrane, no exudate  Chest: Diminished, CTA  CVS: RRR  Abdomen: soft minimal tenderness, decreased BS  Ext: Noted AKA, without edema  Neuro: oriented to person and place   Labs: CBC    Component Value Date/Time   WBC 6.6 08/05/2013 0445   RBC 2.79* 08/05/2013 0445   RBC 3.14* 12/30/2009 0917   HGB 8.5* 08/05/2013 0445   HCT 25.0* 08/05/2013 0445   PLT 158 08/05/2013 0445   MCV 89.6 08/05/2013 0445   MCH 30.5 08/05/2013 0445   MCHC 34.0 08/05/2013 0445   RDW 18.0* 08/05/2013 0445   LYMPHSABS 0.5* 08/05/2013  0445   MONOABS 0.3 08/05/2013 0445   EOSABS 0.0 08/05/2013 0445   BASOSABS 0.0 08/05/2013 0445    BMET    Component Value Date/Time   NA 139 08/06/2013 0430   K 2.8* 08/06/2013 0430   CL 94* 08/06/2013 0430   CO2 38* 08/06/2013 0430   GLUCOSE 131* 08/06/2013 0430   BUN 45* 08/06/2013 0430   CREATININE 0.74 08/06/2013 0430   CALCIUM 7.7* 08/06/2013 0430   GFRNONAA 87* 08/06/2013 0430   GFRAA >90 08/06/2013 0430    CMP     Component Value Date/Time   NA 139 08/06/2013 0430   K 2.8* 08/06/2013 0430   CL 94* 08/06/2013 0430   CO2 38* 08/06/2013 0430   GLUCOSE 131* 08/06/2013 0430   BUN 45* 08/06/2013 0430   CREATININE 0.74 08/06/2013 0430   CALCIUM 7.7* 08/06/2013 0430   PROT 4.8* 08/02/2013 0415   ALBUMIN 1.3* 08/02/2013 0415   AST 36 08/02/2013 0415   ALT 47 08/02/2013 0415   ALKPHOS 172* 08/02/2013 0415   BILITOT 2.0* 08/02/2013 0415   GFRNONAA 87* 08/06/2013 0430   GFRAA >90 08/06/2013 0430       Assessment and Plan: 1. Code Status:Partial as documented  .  Scope of Treatment:  1. Family is hopeful for improvement. They desire all available and offered medial interventions to prolong life.  4. Disposition: Dependant on outcomes  3. Symptom Management:  -- Weakness/Failure to thrive: Continue medical management and make decisions "one day at a time"  4. Psychosocial: Emotional support offered to family and patient.   Wife is very optimistic today seeing "alot of improvement" 5. Spiritual: Strong community church support   Patient Documents Completed or Given: Document Given Completed  Advanced Directives Pkt    MOST    DNR    Gone from My Sight    Hard Choices yes     Lorinda Creed NP  Palliative Medicine Team Team Phone # [phone] Pager [phone]   1

## 2013-08-06 NOTE — Progress Notes (Signed)
[facility] ADULT ICU REPLACEMENT PROTOCOL FOR AM LAB REPLACEMENT ONLY  The patient does apply for the [facility] Adult ICU Electrolyte Replacment Protocol based on the criteria listed below:   1. Is GFR >/= 40 ml/min? yes  Patient's GFR today is 87 2. Is urine output >/= 0.5 ml/kg/hr for the last 6 hours? yes Patient's UOP is 2.1 ml/kg/hr 3. Is BUN < 60 mg/dL? yes  Patient's BUN today is 45 4. Abnormal electrolyte(s): Potassium 5. Ordered repletion with: Potassium per protocol  Claudie Brickhouse P 08/06/2013 6:16 AM

## 2013-08-07 DIAGNOSIS — K559 Vascular disorder of intestine, unspecified: Secondary | ICD-10-CM | POA: Insufficient documentation

## 2013-08-07 LAB — GLUCOSE, CAPILLARY
Glucose-Capillary: 80 mg/dL (ref 70–99)
Glucose-Capillary: 78 mg/dL (ref 70–99)

## 2013-08-07 LAB — BASIC METABOLIC PANEL
BUN: 43 mg/dL — ABNORMAL HIGH (ref 6–23)
CO2: 35 mEq/L — ABNORMAL HIGH (ref 19–32)
Creatinine, Ser: 0.81 mg/dL (ref 0.50–1.35)
GFR calc non Af Amer: 84 mL/min — ABNORMAL LOW (ref >90)
Glucose, Bld: 87 mg/dL (ref 70–99)
Potassium: 3.8 mEq/L (ref 3.5–5.1)

## 2013-08-07 MED ORDER — SODIUM CHLORIDE 0.9 % IJ SOLN
10.0000 mL | INTRAMUSCULAR | Status: DC | PRN
  Administered 2013-08-07: 19:00:00 10 mL
  Administered 2013-08-07: 20 mL

## 2013-08-07 NOTE — Progress Notes (Signed)
[facility] PROGRESS NOTE Interim History: 78 yo male with with a plethora of health issues admitted with abdominal pin and CT abd demonstrates extensive ischemia changes, small bowel distension and was found to be in shock and respiratory failure and was urgently intubated and transferred to ICU. Family was uncertain concerning code status at this time therefore he will be a full code until further meeting with family.    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 - required TNA, HAs not tolerate it diet  Acute resp failure due to COPD Asp PNA ( E. Coli PNA) and Resp + metab alkalosis/Required intubation for Rt AKA 12/14.  - lasix reduce. Watch strict I and O's. - Off bipap. moniotr electrolytes replete as needed. - completed course of antibiotics.  CAD/ isch cardiomyopathy -EF 35%  Vasculopathy -ischemic RLE > s/p R AKA 12/16 NSTEMI  A fib  - Cont lasix but at reduced dose. - Cont amiodarone.  Thrombocytopenia.  Coagulopathy- With low fibrinogen, low grade DIC picture likely related to ischemic leg >> improved after amputation.  P:  -SQ heparin since 12/19 for DVT prevention  -Follow CBC    Code Status: partial code Family Communication: wife  Disposition Plan: inpatient   Consultants:  Vascular   Surgery  PCCM  GI  Procedures: LINES / TUBES:  12- 8 OTT>>12/14, 12-16 intubated in or>> 12/17>>12-17  12-8 Rt fem cvl>>12-8  12-8 Lt IJ cvl>>  R axillary aline 12/8 >> 12/18  CULTURES:  12-8 bc x 2>>ng  12-8 sputum>>e coli S to ceftx / zosyn  ANTIBIOTICS:  12-8 vanc>>12/19  12-8 diflucan>>  12-8 pip tazo>>  SIGNIFICANT EVENTS / STUDIES:  12/8 intubated, shock, surgical evaluation, DNR established, Exploratory laparotomy with entrectomy (majority of jejunum and proximal ileum)  12/9 Segmental small  bowel resection terminal ileum Two anastomoses to re-establish intestinal continuity  12/11 requires higher PEEP/FIO2  12/12 s/p R LE endarterectomy (Dr Early)  12/16 To OR for Rt AKA, returned intubated with 80% fio2  07/30/13 - EXTUBATED  12/18 intermittent NIMVS on 70% NRB  08/02/13: using bipap prn self directed for resp comfort. Good diuresis with lasix but RN says still sounds wet. Still hard to wean off NRB. Otherwise ok. Wants to go home  08/03/13 - ESR 74    HPI/Subjective: No complains, not hungry  Objective: Filed Vitals:   08/06/13 1238 08/06/13 2059 08/07/13 0438 08/07/13 0534  BP: 110/52 117/51  120/62  Pulse: 70 73  88  Temp: 97.5 F (36.4 C) 97.9 F (36.6 C)  98 F (36.7 C)  TempSrc: Oral Oral  Oral  Resp: 20 20  20   Height: 6' (1.829 m)     Weight: 67.9 kg (149 lb 11.1 oz)  66.769 kg (147 lb 3.2 oz)   SpO2: 94% 93%  95%    Intake/Output Summary (Last 24 hours) at 08/07/13 0743 Last data filed at 08/07/13 0437  Gross per 24 hour  Intake   1675 ml  Output   2720 ml  Net  -1045 ml   Filed Weights   08/06/13 0445 08/06/13 1238 08/07/13 0438  Weight: 71.3 kg (157 lb 3 oz) 67.9 kg (149 lb 11.1 oz) 66.769 kg (147 lb 3.2 oz)    Exam:  General: Alert, awake, oriented x3, in no acute distress.  HEENT: No bruits, no goiter.  Heart: Regular rate and rhythm,  without murmurs, rubs, gallops.  Lungs: Good air movement, clear to auscultation. Abdomen: Soft, nontender, nondistended, positive bowel sounds. staple cleans    Data Reviewed: Basic Metabolic Panel:  Recent Labs Lab 08/02/13 0415 08/03/13 0430 08/04/13 0400 08/05/13 0445 08/06/13 0430 08/06/13 2000 08/07/13 0430  NA 133* 135  --   --  139 137 137  K 3.7 3.9  --   --  2.8* 3.7 3.8  CL 98 98  --   --  94* 94* 95*  CO2 31 30  --   --  38* 37* 35*  GLUCOSE 115* 114*  --   --  131* 91 87  BUN 29* 30*  --   --  45* 45* 43*  CREATININE 0.72 0.69  --   --  0.74 0.77 0.81  CALCIUM 7.7* 7.7*  --    --  7.7* 7.7* 8.0*  MG 1.8 2.1 2.0 1.8 1.9  --  1.9  PHOS  --  3.7 3.9 4.6 3.8  --  3.1   Liver Function Tests:  Recent Labs Lab 08/02/13 0415  AST 36  ALT 47  ALKPHOS 172*  BILITOT 2.0*  PROT 4.8*  ALBUMIN 1.3*   No results found for this basename: LIPASE, AMYLASE,  in the last 168 hours No results found for this basename: AMMONIA,  in the last 168 hours CBC:  Recent Labs Lab 08/01/13 0400 08/02/13 0415 08/03/13 0430 08/04/13 0400 08/05/13 0445  WBC 10.1 8.1 7.2 4.8 6.6  NEUTROABS  --   --  5.3 4.1 5.8  HGB 8.1* 8.4* 8.5* 8.1* 8.5*  HCT 24.1* 25.2* 25.5* 24.5* 25.0*  MCV 88.9 88.4 89.8 89.7 89.6  PLT 95* 105* 123* 125* 158   Cardiac Enzymes: No results found for this basename: CKTOTAL, CKMB, CKMBINDEX, TROPONINI,  in the last 168 hours BNP (last 3 results)  Recent Labs  12/23/12 2045 07/18/13 1424 08/03/13 0430  PROBNP 1188.0* 4170.0* 8721.0*   CBG:  Recent Labs Lab 08/05/13 2244 08/06/13 1146 08/06/13 1813 08/07/13 0021 08/07/13 0550  GLUCAP 144* 140* 127* 97 80    No results found for this or any previous visit (from the past 240 hour(s)).   Studies: Dg Chest Port 1 View  08/06/2013   CLINICAL DATA:  Pulmonary edema  EXAM: PORTABLE CHEST - 1 VIEW  COMPARISON:  08/02/2013.  FINDINGS: The cardiac silhouette is enlarged. And left internal jugular central venous catheter is appreciated with tip projecting in the region superior vena cava. Patient is status post median sternotomy and coronary artery bypass grafting. Atherosclerotic calcifications identified within the aorta. Diffuse bilateral pulmonary opacities are appreciated with greatest confluence of the right lung base. When compared to the previous study there has been decreased density and conspicuity of the findings in the right lung base. There is a consolidative component within the right lung base may represent a component of a small effusion. The osseous structures unremarkable.  IMPRESSION:  Slightly more prominent interstitial infiltrate again consistent with pulmonary edema. Focal infiltrate versus atelectasis right lung base is well small effusion. Continued surveillance evaluation recommended. Support line unchanged.   Electronically Signed   By: Salome Holmes M.D.   On: 08/06/2013 08:05    Scheduled Meds: . amiodarone  200 mg Oral Daily  . antiseptic oral rinse  15 mL Mouth Rinse BID  . arformoterol  15 mcg Nebulization BID  . furosemide  60 mg Intravenous Q12H  . heparin subcutaneous  5,000 Units Subcutaneous Q8H  . insulin  aspart  0-9 Units Subcutaneous Q6H  . pantoprazole  40 mg Oral BID AC  . sertraline  100 mg Oral Daily  . sodium chloride  10 mL Intravenous Q12H   Continuous Infusions: . sodium chloride 20 mL/hr at 08/06/13 1258     FELIZ Rosine Beat  [facility] Pager [phone]. If 8PM-8AM, please contact night-coverage at [email or web address], password [facility] 08/07/2013, 7:43 AM  LOS: 20 days

## 2013-08-07 NOTE — Progress Notes (Signed)
Pt able to tolerate small amt regular breakfast this am. C/o no apetite

## 2013-08-08 ENCOUNTER — Inpatient Hospital Stay (HOSPITAL_COMMUNITY): Payer: Medicare Other

## 2013-08-08 DIAGNOSIS — L98499 Non-pressure chronic ulcer of skin of other sites with unspecified severity: Secondary | ICD-10-CM

## 2013-08-08 DIAGNOSIS — I10 Essential (primary) hypertension: Secondary | ICD-10-CM

## 2013-08-08 DIAGNOSIS — I739 Peripheral vascular disease, unspecified: Secondary | ICD-10-CM

## 2013-08-08 DIAGNOSIS — Z79899 Other long term (current) drug therapy: Secondary | ICD-10-CM

## 2013-08-08 DIAGNOSIS — S78119A Complete traumatic amputation at level between unspecified hip and knee, initial encounter: Secondary | ICD-10-CM

## 2013-08-08 LAB — CBC
HCT: 29.8 % — ABNORMAL LOW (ref 39.0–52.0)
MCHC: 32.9 g/dL (ref 30.0–36.0)
MCV: 92 fL (ref 78.0–100.0)
Platelets: 232 10*3/uL (ref 150–400)
RDW: 19.3 % — ABNORMAL HIGH (ref 11.5–15.5)
WBC: 7.6 10*3/uL (ref 4.0–10.5)

## 2013-08-08 LAB — GLUCOSE, CAPILLARY
Glucose-Capillary: 87 mg/dL (ref 70–99)
Glucose-Capillary: 95 mg/dL (ref 70–99)
Glucose-Capillary: 94 mg/dL (ref 70–99)

## 2013-08-08 LAB — BASIC METABOLIC PANEL
BUN: 39 mg/dL — ABNORMAL HIGH (ref 6–23)
CO2: 36 mEq/L — ABNORMAL HIGH (ref 19–32)
Calcium: 8 mg/dL — ABNORMAL LOW (ref 8.4–10.5)
Chloride: 95 mEq/L — ABNORMAL LOW (ref 96–112)
Creatinine, Ser: 0.87 mg/dL (ref 0.50–1.35)
Glucose, Bld: 81 mg/dL (ref 70–99)

## 2013-08-08 LAB — SEDIMENTATION RATE: Sed Rate: 28 mm/hr — ABNORMAL HIGH (ref 0–16)

## 2013-08-08 MED ORDER — MIRTAZAPINE 15 MG PO TBDP
15.0000 mg | ORAL_TABLET | Freq: Every day | ORAL | Status: DC
  Administered 2013-08-08: 15 mg via ORAL
  Filled 2013-08-08 (×2): qty 1

## 2013-08-08 MED ORDER — SODIUM CHLORIDE 0.9 % IV SOLN: INTRAVENOUS | Status: DC

## 2013-08-08 MED ORDER — IOHEXOL 300 MG/ML  SOLN
100.0000 mL | Freq: Once | INTRAMUSCULAR | Status: AC | PRN
  Administered 2013-08-08: 16:00:00 100 mL via INTRAVENOUS

## 2013-08-08 MED ORDER — IOHEXOL 300 MG/ML  SOLN
25.0000 mL | INTRAMUSCULAR | Status: AC
  Administered 2013-08-08 (×2): 25 mL via ORAL

## 2013-08-08 MED ORDER — POTASSIUM CHLORIDE IN NACL 40-0.9 MEQ/L-% IV SOLN
INTRAVENOUS | Status: AC
  Administered 2013-08-08: 12:00:00 via INTRAVENOUS
  Filled 2013-08-08 (×4): qty 1000

## 2013-08-08 MED ORDER — METOCLOPRAMIDE HCL 5 MG PO TABS
5.0000 mg | ORAL_TABLET | Freq: Three times a day (TID) | ORAL | Status: DC
  Administered 2013-08-08 – 2013-08-09 (×5): 5 mg via ORAL
  Filled 2013-08-08 (×9): qty 1

## 2013-08-08 NOTE — Consult Note (Signed)
Physical Medicine and Rehabilitation Consult  Reason for Consult: R-AKA and deconditioning past massive SB resection.  Referring Physician:  Dr. Sharon Seller.   HPI: [name] is a 78 y.o. male with history of CVA, depression, ischemic cardiomyopathy, severe PAD admitted 07/18/13 with abdominal pain. CT abdomen demonstrated extensive ischemic changes. He was urgently intubated for shock and respiratory failure due to SIRS and transferred to ICU. Treated with IV antibiotics for E. Coli in sputum. He was taken to OR  12/08 for exp lap with massive enterectomy (majority of jejunum and proximal ileum) and back to OR 07/22/13 for exp lap with segmental small bowel resection terminal ileum. Extubated 12/014 then back to OR for R AKA 12/16. Post op with recurrent SOB with difficulty mobilizing secretions and  placed on BiPap on 12/19/214. Palliative care consulted for goals of cared/depression.  Mood improving but patient continues to be limited by dyspnea and fatigue. MD, PT recommending CIR.    Review of Systems  Eyes: Negative for blurred vision.  Respiratory: Negative for shortness of breath.   Cardiovascular: Negative for chest pain.  Musculoskeletal: Negative for myalgias.  Neurological: Positive for weakness (In and out of the hospital since October).    Past Medical History  Diagnosis Date  . CEREBROVASCULAR ACCIDENT, HX OF 03/02/2007  . CHEST PAIN 01/31/2010  . CHRONIC OBSTRUCTIVE PULMONARY DISEASE, ACUTE EXACERBATION 02/18/2009  . COPD 03/02/2007  . CORONARY ARTERY DISEASE 03/02/2007  . DEPRESSION 09/23/2007    anxiety  . DISEASE, PERIPHERAL VASCULAR NEC 03/02/2007  . DIVERTICULOSIS, COLON 09/23/2007  . FATIGUE 01/22/2009  . GERD 03/02/2007  . GLUCOSE INTOLERANCE 09/23/2007  . HEMORRHOIDS 10/09/2007  . HIATAL HERNIA 10/09/2007  . HYPERCHOLESTEROLEMIA 10/09/2007  . HYPERTENSION 09/23/2007  . HYPOKALEMIA 10/09/2007  . MESENTERIC VASCULAR INSUFFICIENCY 07/06/2008  . MITRAL REGURGITATION 11/27/2008  . MI  10/09/2007  . PERIPHERAL VASCULAR DISEASE 09/23/2007  . PUD 10/09/2007  . RENAL ARTERY STENOSIS 03/02/2007  . TOBACCO ABUSE 04/20/2010  . 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 URINARY RETENTION 01/22/2009  . VITAMIN B12 DEFICIENCY 01/03/2010  . WRIST PAIN, RIGHT 06/06/2010  . Impaired glucose tolerance 01/29/2011  . Hearing loss in left ear 01/30/2011  . Blindness of both eyes 01/30/2011  . Hyperlipidemia   . Anemia, iron deficiency   . Atrial flutter   . HTN (hypertension) 03/21/2011  . AVM (arteriovenous malformation) 09/2010    stomach and duodenum  . PUD (peptic ulcer disease) 09/2010    pyloric ulcer   Past Surgical History  Procedure Laterality Date  . Coronary artery bypass graft  1995    LIMA-LAD, SVG-OM, SVG-D, SVG-PDA  . Renal artery stenting  2004    bilateral  . S/p multiple le vascular bypass      includine fem-pop x 2 LLE  . S/p bilateral cea    . Coronary angioplasty with stent placement  2010    DES x 2 SVG-OM, cutting balloon PTCA SVG-PDA for ISR  . Appendectomy    . S/p lumbar disc surgury      x2  . S/p rle fem bypass feb 2011    . Cardiac catheterization    . Pr vein bypass graft,aorto-fem-pop    . Carotid endarterectomy    . Coronary angioplasty with stent placement  2011    DES to SVG-DIAG, SVG-PDA  . Esophagogastroduodenoscopy    . Colonoscopy    . Esophagogastroduodenoscopy N/A 07/09/2013    Procedure: ESOPHAGOGASTRODUODENOSCOPY (EGD);  Surgeon: Meryl Dare, MD;  Location:  [facility] ENDOSCOPY;  Service: Endoscopy;  Laterality: N/A;  . Esophagogastroduodenoscopy N/A 07/20/2013    Procedure: ESOPHAGOGASTRODUODENOSCOPY (EGD);  Surgeon: Hart Carwin, MD;  Location: [facility] ENDOSCOPY;  Service: Endoscopy;  Laterality: N/A;  . Laparotomy N/A 07/21/2013    Procedure: EXPLORATORY LAPAROTOMY POSSIBLE BOWEL RESECTION;  Surgeon: Velora Heckler, MD;  Location: [facility] OR;  Service: General;  Laterality: N/A;  . Laparotomy N/A 07/22/2013    Procedure: EXPLORATORY LAPAROTOMY,  SMALL BOWEL RESECTION;  Surgeon: Velora Heckler, MD;  Location: [facility] OR;  Service: General;  Laterality: N/A;  . Thrombectomy femoral artery Right 07/25/2013    Procedure: THROMBECTOMY OF  FEMORAL ARTERY;  Surgeon: Larina Earthly, MD;  Location: [facility] OR;  Service: Vascular;  Laterality: Right;  . Amputation Right 07/29/2013    Procedure: AMPUTATION ABOVE KNEE;  Surgeon: Nada Libman, MD;  Location: [facility] OR;  Service: Vascular;  Laterality: Right;   Family History  Problem Relation Age of Onset  . Colon cancer Mother 54  . Heart disease Father   . Cancer Father   . Heart disease Sister   . Prostate cancer Brother     prostate cancer  . Stroke Sister    Social History:  Married. He reports that he has been smoking Cigarettes--1 PPD  He has a 60 pack-year smoking history. He has never used smokeless tobacco. He reports that he does not drink alcohol or use illicit drugs.  Allergies: No Known Allergies  Medications Prior to Admission  Medication Sig Dispense Refill  . acetaminophen (TYLENOL) 500 MG tablet Take 500 mg by mouth every 6 (six) hours as needed for moderate pain.       Marland Kitchen ALPRAZolam (XANAX) 0.25 MG tablet Take 1 tablet (0.25 mg total) by mouth 2 (two) times daily as needed for anxiety.  60 tablet  0  . amiodarone (PACERONE) 400 MG tablet Take 200 mg by mouth daily.      Marland Kitchen atorvastatin (LIPITOR) 40 MG tablet Take 40 mg by mouth daily.      . carvedilol (COREG) 3.125 MG tablet Take 1 tablet (3.125 mg total) by mouth 2 (two) times daily with a meal.      . cilostazol (PLETAL) 100 MG tablet Take 100 mg by mouth 2 (two) times daily.      . ferrous sulfate 325 (65 FE) MG tablet Take 325 mg by mouth 2 (two) times daily with a meal.       . furosemide (LASIX) 20 MG tablet Take 1 tablet (20 mg total) by mouth daily.  30 tablet  0  . nitroGLYCERIN (NITROSTAT) 0.4 MG SL tablet Place 1 tablet (0.4 mg total) under the tongue every 5 (five) minutes as needed for chest pain.  30 tablet  0  .  pantoprazole (PROTONIX) 40 MG tablet Take 1 tablet (40 mg total) by mouth 2 (two) times daily before a meal.  60 tablet  0  . potassium chloride 20 MEQ TBCR Take 20 mEq by mouth daily.  20 tablet  0  . promethazine (PHENERGAN) 25 MG tablet Take 1 tablet (25 mg total) by mouth every 8 (eight) hours as needed for nausea or vomiting.  30 tablet  2  . ranitidine (ZANTAC) 150 MG tablet Take 1 tablet (150 mg total) by mouth at bedtime.  90 tablet  3  . sertraline (ZOLOFT) 100 MG tablet Take 1 tablet (100 mg total) by mouth daily.  90 tablet  3  . feeding supplement, RESOURCE BREEZE, (RESOURCE BREEZE)  LIQD Take 1 Container by mouth 2 (two) times daily between meals.  30 Container  0    Home: Home Living Family/patient expects to be discharged to:: Private residence Living Arrangements: Other relatives Available Help at Discharge: Family;Available 24 hours/day Type of Home: House Home Access: Stairs to enter [employer] of Steps: 1 Entrance Stairs-Rails: None Home Layout: One level Home Equipment: Walker - 2 wheels;Wheelchair - power;Tub bench  Functional History: Prior Function Comments: From previous encounter notes, Dtr reports that pt with limited activity.  He performed self care activities, but only walked from bedroom to kitchen to bathroom Functional Status:  Mobility: Bed Mobility Bed Mobility: Rolling Right;Rolling Left;Supine to Sit;Sitting - Scoot to [place] of Bed;Sit to Supine Rolling Right: 3: Mod assist Rolling Left: 3: Mod assist Supine to Sit: 1: +2 Total assist;HOB elevated Supine to Sit: Patient Percentage: 30% Sitting - Scoot to Edge of Bed: 3: Mod assist Sitting - Scoot to Edge of Bed: Patient Percentage: 30% Sit to Supine: 1: +2 Total assist;HOB flat Sit to Supine: Patient Percentage: 30% Scooting to HOB: 1: +2 Total assist Scooting to [facility]: Patient Percentage: 0% Transfers Transfers: Not assessed Sit to Stand: Not tested (comment)      ADL:     Cognition: Cognition Overall Cognitive Status: Within Functional Limits for tasks assessed Orientation Level: Oriented X4 Cognition Arousal/Alertness: Awake/alert Behavior During Therapy: WFL for tasks assessed/performed Overall Cognitive Status: Within Functional Limits for tasks assessed  Blood pressure 119/56, pulse 89, temperature 97.6 F (36.4 C), temperature source Oral, resp. rate 20, height 6' (1.829 m), weight 63.05 kg (139 lb), SpO2 98.00%. Physical Exam  Nursing note and vitals reviewed. Constitutional: He has a sickly appearance. Nasal cannula in place.  Depressed appearing elderly male. Does not make eye contact. Occasional one word whispered answers.   HENT:  Head: Normocephalic and atraumatic.  Eyes: Conjunctivae are normal. Pupils are equal, round, and reactive to light.  Neck: Normal range of motion.  Cardiovascular: Regular rhythm.   Respiratory: Effort normal and breath sounds normal. No respiratory distress. He has no wheezes.  GI: Soft. Bowel sounds are normal. He exhibits no distension. There is no tenderness.  Musculoskeletal:  R-AKA with staples in place and min edema. Wound clean. Area minimally tender.  Neurological: He is alert. A cranial nerve deficit is present. Coordination normal.  Moves all 4's.   Skin: Skin is warm and dry.  Psychiatric:  Affect very flat. Has basic insight and awareness.    Results for orders placed during the hospital encounter of 07/18/13 (from the past 24 hour(s))  GLUCOSE, CAPILLARY     Status: None   Collection Time    08/07/13 11:45 AM      Result Value Range   Glucose-Capillary 78  70 - 99 mg/dL   Comment 1 Documented in Chart     Comment 2 Notify RN    GLUCOSE, CAPILLARY     Status: None   Collection Time    08/07/13  4:19 PM      Result Value Range   Glucose-Capillary 83  70 - 99 mg/dL   Comment 1 Notify RN    GLUCOSE, CAPILLARY     Status: None   Collection Time    08/08/13 12:02 AM      Result Value  Range   Glucose-Capillary 87  70 - 99 mg/dL   Comment 1 Notify RN     Comment 2 Documented in Chart    BASIC METABOLIC PANEL  Status: Abnormal   Collection Time    08/08/13  6:20 AM      Result Value Range   Sodium 139  135 - 145 mEq/L   Potassium 3.0 (*) 3.5 - 5.1 mEq/L   Chloride 95 (*) 96 - 112 mEq/L   CO2 36 (*) 19 - 32 mEq/L   Glucose, Bld 81  70 - 99 mg/dL   BUN 39 (*) 6 - 23 mg/dL   Creatinine, Ser 4.78  0.50 - 1.35 mg/dL   Calcium 8.0 (*) 8.4 - 10.5 mg/dL   GFR calc non Af Amer 82 (*) >90 mL/min   GFR calc Af Amer >90  >90 mL/min   No results found.  Assessment/Plan: Diagnosis: Hx of ischemic bowel s/p resection, right AKA 1. Does the need for close, 24 hr/day medical supervision in concert with the patient's rehab needs make it unreasonable for this patient to be served in a less intensive setting? Yes and Potentially 2. Co-Morbidities requiring supervision/potential complications: copd, htn, chf 3. Due to bladder management, bowel management, safety, skin/wound care, disease management, medication administration, pain management and patient education, does the patient require 24 hr/day rehab nursing? Yes and Potentially 4. Does the patient require coordinated care of a physician, rehab nurse, PT (1-2 hrs/day, 5 days/week) and OT (1-2 hrs/day, 5 days/week) to address physical and functional deficits in the context of the above medical diagnosis(es)? Potentially Addressing deficits in the following areas: balance, endurance, locomotion, strength, transferring, bowel/bladder control, bathing, dressing, grooming, toileting and psychosocial support 5. Can the patient actively participate in an intensive therapy program of at least 3 hrs of therapy per day at least 5 days per week? Potentially 6. The potential for patient to make measurable gains while on inpatient rehab is good 7. Anticipated functional outcomes upon discharge from inpatient rehab are supervision to min assist  with PT, min assist with OT, n/a with SLP. 8. Estimated rehab length of stay to reach the above functional goals is: 12-15 days 9. Does the patient have adequate social supports to accommodate these discharge functional goals? Yes and Potentially 10. Anticipated D/C setting: Home 11. Anticipated post D/C treatments: HH therapy 12. Overall Rehab/Functional Prognosis: good  RECOMMENDATIONS: This patient's condition is appropriate for continued rehabilitative care in the following setting: CIR (see below) Patient has agreed to participate in recommended program. No and Potentially Note that insurance prior authorization may be required for reimbursement for recommended care.  Comment: I think this patient would benefit from our program. He is however depressed about being here and wants to go home. Family seems to want to take him home as well. I encouraged the wife to speak with family and have them come in to see the amount of help that he requires before they commit to bringing him home. (He will require a substantial amount of care).  Rehab RN to follow up.   Ranelle Oyster, MD, [place] [facility]     08/08/2013

## 2013-08-08 NOTE — Progress Notes (Addendum)
Rehab admissions - Evaluated for possible admission.  Noted patient wants to go home.  He has [employer] and they are closed today.  If patient does want to come to inpatient rehab, we will need an OT consult ordered.  I will follow up on Monday for plans.  Call me for questions.  [phone]  I spoke with wife today and she does not want me to pursue acute inpatient rehab admission.  She hopes to take patient home rather than come to inpatient rehab.  I will not seek insurance authorization for inpatient rehab admission at this time.  Call me for questions.  #[phone]

## 2013-08-08 NOTE — Progress Notes (Addendum)
[facility] PROGRESS NOTE Interim History: 78 yo male with with a plethora of health issues admitted with abdominal pin and CT abd demonstrates extensive ischemia changes, small bowel distension and was found to be in shock and respiratory failure and was urgently intubated and transferred to ICU. Family was uncertain concerning code status at this time therefore he will be a full code until further meeting with family.    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 - required TNA, HAs not tolerate it diet. - Pt having nausea and vomiting. He is on amiodarone which can cause nausea an vomiting. - ct abd and pelvis. Start Remeron and reglan. - check ESR.  Acute resp failure due to COPD Asp PNA ( E. Coli PNA) and Resp + metab alkalosis/Required intubation for Rt AKA 12/14.  - Hold lasix. Watch strict I and O's. Not eatingh becoming hyponatremia. - Off bipap. moniotr electrolytes replete as needed. - completed course of antibiotics.  CAD/ isch cardiomyopathy -EF 35%  Vasculopathy -ischemic RLE > s/p R AKA 12/16 NSTEMI  A fib  - Hold lasix - Cont amiodarone.  Thrombocytopenia.  Coagulopathy- With low fibrinogen, low grade DIC picture likely related to ischemic leg >> improved after amputation.  P:  -SQ heparin since 12/19 for DVT prevention  -Follow CBC  Code Status: partial code Family Communication: wife  Disposition Plan: inpatient   Consultants:  Vascular   Surgery  PCCM  GI  Procedures: LINES / TUBES:  12- 8 OTT>>12/14, 12-16 intubated in or>> 12/17>>12-17  12-8 Rt fem cvl>>12-8  12-8 Lt IJ cvl>>  R axillary aline 12/8 >> 12/18  CULTURES:  12-8 bc x 2>>ng  12-8 sputum>>e coli S to ceftx / zosyn  ANTIBIOTICS:  12-8 vanc>>12/19  12-8 diflucan>>  12-8 pip tazo>>  SIGNIFICANT EVENTS / STUDIES:  12/8  intubated, shock, surgical evaluation, DNR established, Exploratory laparotomy with entrectomy (majority of jejunum and proximal ileum)  12/9 Segmental small bowel resection terminal ileum Two anastomoses to re-establish intestinal continuity  12/11 requires higher PEEP/FIO2  12/12 s/p R LE endarterectomy (Dr Early)  12/16 To OR for Rt AKA, returned intubated with 80% fio2  07/30/13 - EXTUBATED  12/18 intermittent NIMVS on 70% NRB  08/02/13: using bipap prn self directed for resp comfort. Good diuresis with lasix but RN says still sounds wet. Still hard to wean off NRB. Otherwise ok. Wants to go home  08/03/13 - ESR 74    HPI/Subjective: No complains, not hungry, no abdominal pain. Passing gas and having BM.  Objective: Filed Vitals:   08/07/13 2109 08/08/13 0518 08/08/13 0915 08/08/13 1003  BP: 121/60 119/56 106/50   Pulse: 106 89 91   Temp: 97.8 F (36.6 C) 97.6 F (36.4 C) 97.4 F (36.3 C)   TempSrc: Oral Oral Oral   Resp: 20 20    Height:      Weight:  63.05 kg (139 lb)    SpO2: 98% 98% 98% 96%    Intake/Output Summary (Last 24 hours) at 08/08/13 1045 Last data filed at 08/08/13 0648  Gross per 24 hour  Intake    780 ml  Output   3550 ml  Net  -2770 ml   Filed Weights   08/06/13 1238 08/07/13 0438 08/08/13 0518  Weight: 67.9 kg (149 lb 11.1 oz) 66.769 kg (147 lb 3.2 oz) 63.05 kg (139 lb)  Exam:  General: Alert, awake, oriented x3, in no acute distress.  HEENT: No bruits, no goiter.  Heart: Regular rate and rhythm, without murmurs, rubs, gallops.  Lungs: Good air movement, clear to auscultation. Abdomen: Soft, nontender, nondistended, positive bowel sounds. staple cleans    Data Reviewed: Basic Metabolic Panel:  Recent Labs Lab 08/03/13 0430 08/04/13 0400 08/05/13 0445 08/06/13 0430 08/06/13 2000 08/07/13 0430 08/08/13 0620  NA 135  --   --  139 137 137 139  K 3.9  --   --  2.8* 3.7 3.8 3.0*  CL 98  --   --  94* 94* 95* 95*  CO2 30  --   --   38* 37* 35* 36*  GLUCOSE 114*  --   --  131* 91 87 81  BUN 30*  --   --  45* 45* 43* 39*  CREATININE 0.69  --   --  0.74 0.77 0.81 0.87  CALCIUM 7.7*  --   --  7.7* 7.7* 8.0* 8.0*  MG 2.1 2.0 1.8 1.9  --  1.9  --   PHOS 3.7 3.9 4.6 3.8  --  3.1  --    Liver Function Tests:  Recent Labs Lab 08/02/13 0415  AST 36  ALT 47  ALKPHOS 172*  BILITOT 2.0*  PROT 4.8*  ALBUMIN 1.3*   No results found for this basename: LIPASE, AMYLASE,  in the last 168 hours No results found for this basename: AMMONIA,  in the last 168 hours CBC:  Recent Labs Lab 08/02/13 0415 08/03/13 0430 08/04/13 0400 08/05/13 0445  WBC 8.1 7.2 4.8 6.6  NEUTROABS  --  5.3 4.1 5.8  HGB 8.4* 8.5* 8.1* 8.5*  HCT 25.2* 25.5* 24.5* 25.0*  MCV 88.4 89.8 89.7 89.6  PLT 105* 123* 125* 158   Cardiac Enzymes: No results found for this basename: CKTOTAL, CKMB, CKMBINDEX, TROPONINI,  in the last 168 hours BNP (last 3 results)  Recent Labs  12/23/12 2045 07/18/13 1424 08/03/13 0430  PROBNP 1188.0* 4170.0* 8721.0*   CBG:  Recent Labs Lab 08/07/13 0021 08/07/13 0550 08/07/13 1145 08/07/13 1619 08/08/13 0002  GLUCAP 97 80 78 83 87    No results found for this or any previous visit (from the past 240 hour(s)).   Studies: No results found.  Scheduled Meds: . amiodarone  200 mg Oral Daily  . antiseptic oral rinse  15 mL Mouth Rinse BID  . arformoterol  15 mcg Nebulization BID  . furosemide  60 mg Intravenous Q12H  . heparin subcutaneous  5,000 Units Subcutaneous Q8H  . insulin aspart  0-9 Units Subcutaneous Q6H  . pantoprazole  40 mg Oral BID AC  . sertraline  100 mg Oral Daily  . sodium chloride  10 mL Intravenous Q12H   Continuous Infusions: . sodium chloride 20 mL/hr (08/08/13 0814)     Marinda Elk  [facility] Pager [phone]. If 8PM-8AM, please contact night-coverage at [email or web address], password [email or web address] 08/08/2013, 10:45 AM  LOS: 21 days

## 2013-08-08 NOTE — Progress Notes (Signed)
[facility] is continuing to monitor this patient's progress to disposition.  We will not engage at this time to decrease the patient's risk of confusion about home service support.  It is anticipated that he will be better served by receiving some form of short term rehabilitation.  If he ultimately goes home directly we will plan to engage his care at that time.  Of note, [facility] Care Management services does not replace or interfere with any services that are arranged by inpatient case management or social work.  For additional questions or referrals please contact Anibal Henderson BSN RN [facility] Liaison at [phone].

## 2013-08-09 LAB — COMPREHENSIVE METABOLIC PANEL
ALT: 98 U/L — ABNORMAL HIGH (ref 0–53)
AST: 52 U/L — ABNORMAL HIGH (ref 0–37)
Albumin: 1.9 g/dL — ABNORMAL LOW (ref 3.5–5.2)
Alkaline Phosphatase: 226 U/L — ABNORMAL HIGH (ref 39–117)
BUN: 30 mg/dL — ABNORMAL HIGH (ref 6–23)
Chloride: 98 mEq/L (ref 96–112)
Creatinine, Ser: 0.77 mg/dL (ref 0.50–1.35)
GFR calc Af Amer: >90 mL/min (ref >90)
Potassium: 3.4 mEq/L — ABNORMAL LOW (ref 3.5–5.1)
Sodium: 137 mEq/L (ref 135–145)
Total Bilirubin: 1.8 mg/dL — ABNORMAL HIGH (ref 0.3–1.2)
Total Protein: 5 g/dL — ABNORMAL LOW (ref 6.0–8.3)

## 2013-08-09 LAB — CBC WITH DIFFERENTIAL/PLATELET
Basophils Absolute: 0 10*3/uL (ref 0.0–0.1)
Eosinophils Absolute: 0.1 10*3/uL (ref 0.0–0.7)
Eosinophils Relative: 1 % (ref 0–5)
HCT: 30.2 % — ABNORMAL LOW (ref 39.0–52.0)
Hemoglobin: 9.7 g/dL — ABNORMAL LOW (ref 13.0–17.0)
Lymphs Abs: 1.4 10*3/uL (ref 0.7–4.0)
MCH: 30 pg (ref 26.0–34.0)
MCV: 93.5 fL (ref 78.0–100.0)
Monocytes Absolute: 0.5 10*3/uL (ref 0.1–1.0)
Platelets: 226 10*3/uL (ref 150–400)
RBC: 3.23 MIL/uL — ABNORMAL LOW (ref 4.22–5.81)
RDW: 19.8 % — ABNORMAL HIGH (ref 11.5–15.5)

## 2013-08-09 LAB — GLUCOSE, CAPILLARY: Glucose-Capillary: 95 mg/dL (ref 70–99)

## 2013-08-09 MED ORDER — ENSURE COMPLETE PO LIQD: 237.0000 mL | Freq: Two times a day (BID) | ORAL | Status: DC

## 2013-08-09 MED ORDER — ONDANSETRON HCL 4 MG/2ML IJ SOLN
4.0000 mg | Freq: Four times a day (QID) | INTRAMUSCULAR | Status: DC | PRN
  Administered 2013-08-10: 4 mg via INTRAVENOUS
  Filled 2013-08-09 (×2): qty 2

## 2013-08-09 MED ORDER — ARFORMOTEROL TARTRATE 15 MCG/2ML IN NEBU
15.0000 ug | INHALATION_SOLUTION | Freq: Two times a day (BID) | RESPIRATORY_TRACT | Status: DC
  Administered 2013-08-09 – 2013-08-13 (×8): 15 ug via RESPIRATORY_TRACT
  Filled 2013-08-09 (×10): qty 2

## 2013-08-09 MED ORDER — FENTANYL CITRATE 0.05 MG/ML IJ SOLN: 12.5000 ug | INTRAMUSCULAR | Status: DC | PRN

## 2013-08-09 MED ORDER — METOCLOPRAMIDE HCL 5 MG PO TABS
5.0000 mg | ORAL_TABLET | Freq: Three times a day (TID) | ORAL | Status: DC
  Administered 2013-08-09 – 2013-08-13 (×16): 5 mg via ORAL
  Filled 2013-08-09 (×23): qty 1

## 2013-08-09 MED ORDER — MORPHINE SULFATE (CONCENTRATE) 10 MG /0.5 ML PO SOLN
10.0000 mg | ORAL | Status: DC | PRN
  Administered 2013-08-09 – 2013-08-12 (×8): 10 mg via ORAL
  Filled 2013-08-09 (×10): qty 0.5

## 2013-08-09 MED ORDER — AMIODARONE HCL 200 MG PO TABS
200.0000 mg | ORAL_TABLET | Freq: Every day | ORAL | Status: DC
  Administered 2013-08-10: 12:00:00 200 mg via ORAL
  Filled 2013-08-09 (×6): qty 1

## 2013-08-09 MED ORDER — SODIUM CHLORIDE 0.9 % IJ SOLN
10.0000 mL | INTRAMUSCULAR | Status: DC | PRN
  Administered 2013-08-09: 20 mL

## 2013-08-09 MED ORDER — SERTRALINE HCL 100 MG PO TABS
100.0000 mg | ORAL_TABLET | Freq: Every day | ORAL | Status: DC
  Administered 2013-08-10 – 2013-08-13 (×4): 100 mg via ORAL
  Filled 2013-08-09 (×6): qty 1

## 2013-08-09 MED ORDER — ALUM & MAG HYDROXIDE-SIMETH 200-200-20 MG/5 ML NICU TOPICAL
1.0000 "application " | TOPICAL | Status: DC | PRN
  Filled 2013-08-09: qty 355

## 2013-08-09 MED ORDER — LORAZEPAM 2 MG/ML IJ SOLN: 0.5000 mg | Freq: Once | INTRAMUSCULAR | Status: DC

## 2013-08-09 MED ORDER — PANTOPRAZOLE SODIUM 40 MG PO TBEC
40.0000 mg | DELAYED_RELEASE_TABLET | Freq: Every day | ORAL | Status: DC
  Administered 2013-08-10: 13:00:00 40 mg via ORAL
  Filled 2013-08-09: qty 1

## 2013-08-09 MED ORDER — MIRTAZAPINE 7.5 MG PO TABS
7.5000 mg | ORAL_TABLET | Freq: Every day | ORAL | Status: DC
  Administered 2013-08-09 – 2013-08-12 (×4): 7.5 mg via ORAL
  Filled 2013-08-09 (×5): qty 1

## 2013-08-09 MED ORDER — ALUM & MAG HYDROXIDE-SIMETH 200-200-20 MG/5ML PO SUSP
15.0000 mL | Freq: Four times a day (QID) | ORAL | Status: DC | PRN
Start: 2013-08-09 — End: 2013-08-14
  Filled 2013-08-09: qty 30

## 2013-08-09 MED ORDER — HEPARIN SODIUM (PORCINE) 5000 UNIT/ML IJ SOLN
5000.0000 [IU] | Freq: Three times a day (TID) | INTRAMUSCULAR | Status: DC
  Administered 2013-08-09 – 2013-08-13 (×12): 5000 [IU] via SUBCUTANEOUS
  Filled 2013-08-09 (×14): qty 1

## 2013-08-09 NOTE — Progress Notes (Signed)
Ngt inserted as per pt and family request without incident.

## 2013-08-09 NOTE — Progress Notes (Signed)
[facility] PROGRESS NOTE Interim History: 78 yo male with with a plethora of health issues admitted with abdominal pin and CT abd demonstrates extensive ischemia changes, small bowel distension and was found to be in shock and respiratory failure and was urgently intubated and transferred to ICU. Family was uncertain concerning code status at this time therefore he will be a full code until further meeting with family.    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 - required TNA, HAs not tolerate it diet. - Pt having nausea and vomiting. He is on amiodarone which can cause nausea an vomiting. - ct abd and pelvis. Start Remeron and reglan. - 26 ESR. - pt will like to stop treatment and made comfortable. D/w family risk and benefit. They will like to stop all medication except dose that will make him comfortable. - d/c medications and use morphine and ativan.  Acute resp failure due to COPD Asp PNA ( E. Coli PNA) and Resp + metab alkalosis/Required intubation for Rt AKA 12/14.  - Hold lasix. Watch strict I and O's. Not eatingh becoming hyponatremia. - Off bipap. moniotr electrolytes replete as needed. - completed course of antibiotics.  CAD/ isch cardiomyopathy -EF 35%  Vasculopathy -ischemic RLE > s/p R AKA 12/16 NSTEMI  A fib  - Hold lasix - Cont amiodarone.  Thrombocytopenia.  Coagulopathy- With low fibrinogen, low grade DIC picture likely related to ischemic leg >> improved after amputation.  P:  -SQ heparin since 12/19 for DVT prevention  -Follow CBC  Code Status: partial code Family Communication: wife  Disposition Plan: inpatient   Consultants:  Vascular   Surgery  PCCM  GI  Procedures: LINES / TUBES:  12- 8 OTT>>12/14, 12-16 intubated in or>> 12/17>>12-17  12-8 Rt fem cvl>>12-8  12-8 Lt IJ cvl>>  R  axillary aline 12/8 >> 12/18  CULTURES:  12-8 bc x 2>>ng  12-8 sputum>>e coli S to ceftx / zosyn  ANTIBIOTICS:  12-8 vanc>>12/19  12-8 diflucan>>  12-8 pip tazo>>  SIGNIFICANT EVENTS / STUDIES:  12/8 intubated, shock, surgical evaluation, DNR established, Exploratory laparotomy with entrectomy (majority of jejunum and proximal ileum)  12/9 Segmental small bowel resection terminal ileum Two anastomoses to re-establish intestinal continuity  12/11 requires higher PEEP/FIO2  12/12 s/p R LE endarterectomy (Dr Early)  12/16 To OR for Rt AKA, returned intubated with 80% fio2  07/30/13 - EXTUBATED  12/18 intermittent NIMVS on 70% NRB  08/02/13: using bipap prn self directed for resp comfort. Good diuresis with lasix but RN says still sounds wet. Still hard to wean off NRB. Otherwise ok. Wants to go home  08/03/13 - ESR 74    HPI/Subjective: No complains, not hungry, no abdominal pain. Passing gas and having BM.  Objective: Filed Vitals:   08/08/13 2140 08/08/13 2216 08/09/13 0534 08/09/13 0835  BP:  112/46 111/46   Pulse:  88 87   Temp:  97.6 F (36.4 C) 97.9 F (36.6 C)   TempSrc:  Oral Oral   Resp:   20   Height:      Weight:   63 kg (138 lb 14.2 oz)   SpO2: 95% 95% 97% 96%    Intake/Output Summary (Last 24 hours) at 08/09/13 1118 Last data filed at 08/09/13 0600  Gross per 24 hour  Intake    598 ml  Output   1400 ml  Net   -802 ml   Filed Weights   08/07/13 0438 08/08/13 0518 08/09/13 0534  Weight: 66.769 kg (147 lb 3.2 oz) 63.05 kg (139 lb) 63 kg (138 lb 14.2 oz)    Exam:  General: Alert, awake, oriented x3, in no acute distress.  HEENT: No bruits, no goiter.  Heart: Regular rate and rhythm, without murmurs, rubs, gallops.  Lungs: Good air movement, clear to auscultation. Abdomen: Soft, nontender, nondistended, positive bowel sounds. staple cleans    Data Reviewed: Basic Metabolic Panel:  Recent Labs Lab 08/03/13 0430 08/04/13 0400 08/05/13 0445  08/06/13 0430 08/06/13 2000 08/07/13 0430 08/08/13 0620 08/09/13 0529  NA 135  --   --  139 137 137 139 137  K 3.9  --   --  2.8* 3.7 3.8 3.0* 3.4*  CL 98  --   --  94* 94* 95* 95* 98  CO2 30  --   --  38* 37* 35* 36* 34*  GLUCOSE 114*  --   --  131* 91 87 81 67*  BUN 30*  --   --  45* 45* 43* 39* 30*  CREATININE 0.69  --   --  0.74 0.77 0.81 0.87 0.77  CALCIUM 7.7*  --   --  7.7* 7.7* 8.0* 8.0* 7.8*  MG 2.1 2.0 1.8 1.9  --  1.9  --   --   PHOS 3.7 3.9 4.6 3.8  --  3.1  --   --    Liver Function Tests:  Recent Labs Lab 08/09/13 0529  AST 52*  ALT 98*  ALKPHOS 226*  BILITOT 1.8*  PROT 5.0*  ALBUMIN 1.9*   No results found for this basename: LIPASE, AMYLASE,  in the last 168 hours No results found for this basename: AMMONIA,  in the last 168 hours CBC:  Recent Labs Lab 08/03/13 0430 08/04/13 0400 08/05/13 0445 08/08/13 1704 08/09/13 0529  WBC 7.2 4.8 6.6 7.6 7.6  NEUTROABS 5.3 4.1 5.8  --  5.5  HGB 8.5* 8.1* 8.5* 9.8* 9.7*  HCT 25.5* 24.5* 25.0* 29.8* 30.2*  MCV 89.8 89.7 89.6 92.0 93.5  PLT 123* 125* 158 232 226   Cardiac Enzymes: No results found for this basename: CKTOTAL, CKMB, CKMBINDEX, TROPONINI,  in the last 168 hours BNP (last 3 results)  Recent Labs  12/23/12 2045 07/18/13 1424 08/03/13 0430  PROBNP 1188.0* 4170.0* 8721.0*   CBG:  Recent Labs Lab 08/08/13 0638 08/08/13 1215 08/08/13 1817 08/08/13 2358 08/09/13 0645  GLUCAP 87 95 94 95 70    No results found for this or any previous visit (from the past 240 hour(s)).   Studies: Ct Abdomen Pelvis W Contrast  08/08/2013   CLINICAL DATA:  Nausea and vomiting.  EXAM: CT ABDOMEN AND PELVIS WITH CONTRAST  TECHNIQUE: Multidetector CT imaging of the abdomen and pelvis was performed using the standard protocol following bolus administration of intravenous contrast.  CONTRAST:  OMNIPAQUE IOHEXOL 300 MG/ML SOLN. Hand injection was performed through central line without difficulty or  complication.  COMPARISON:  CT abdomen/pelvis 07/21/2013  FINDINGS: Increased bilateral small to moderate pleural effusions are noted with associated compressive atelectasis. Portion of collapsed right lower lobe is hypodense centrally which could indicate necrosis or fluid/drowned lung. Heart size is mildly enlarged.  Wedge-shaped areas of peripheral splenic hypoenhancement are reidentified, unchanged since the prior exam, which again could represent infarcts or vascular perfusion phenomenon.  Layering sludge or stones noted within the gallbladder. Liver, right adrenal gland, right  kidney, and pancreas appear normal. 2.5 cm mixed density left adrenal mass is stable in appearance. Dense atheromatous calcification of the non aneurysmal abdominal aorta and branch vessels again noted, with renal arterial stents in place. Minimal infrarenal abdominal aortic ectasia is stable.  The patient has has interval midline abdominal wall incision compatible with laparotomy. Trace pelvic fluid is identified. Colonic diverticuli noted without evidence for diverticulitis. Bladder wall thickness at upper limits of normal with internal air-fluid level suggesting recent catheterization. Since the prior exam, small bowel dilatation has resolved. No new area of bowel wall thickening or focal segmental dilatation is identified.  No new acute suboptimal suspect no new acute osseous abnormality. Lumbar spine degenerative change is present.  IMPRESSION: Interval resolution of previously seen small bowel dilatation after apparent midline laparotomy. Trace pelvic fluid is likely postoperative.  No new intra-abdominal or pelvic pathology.  Persistent apparent perfusion phenomenon in the spleen with wedge-shaped hypoperfused regions which could indicate infarct, unchanged.  Increased bilateral lower lobe consolidation, right greater than left, with areas of hypodensity within the right lower lobe collapse segment which could represent necrosis  in the setting of pneumonia, drowned lung, or aspiration of lipoid material given the internal low-density.   Electronically Signed   By: Christiana Pellant M.D.   On: 08/08/2013 16:01    Scheduled Meds: . amiodarone  200 mg Oral Daily  . antiseptic oral rinse  15 mL Mouth Rinse BID  . arformoterol  15 mcg Nebulization BID  . heparin subcutaneous  5,000 Units Subcutaneous Q8H  . insulin aspart  0-9 Units Subcutaneous Q6H  . metoCLOPramide  5 mg Oral TID AC & HS  . mirtazapine  15 mg Oral QHS  . pantoprazole  40 mg Oral BID AC  . sertraline  100 mg Oral Daily   Continuous Infusions: . 0.9 % NaCl with KCl 40 mEq / L 50 mL/hr at 08/08/13 1218     [name]  [facility] Pager [phone]. If 8PM-8AM, please contact night-coverage at [email or web address], password [facility] 08/09/2013, 11:18 AM  LOS: 22 days

## 2013-08-09 NOTE — Progress Notes (Signed)
SLP Cancellation Note  Patient Details Name: [name] MRN: [id number] DOB: 23-Jun-1935  Order received for swallow assessment - spoke with RN, who reports pt presents with + s/s of aspiration.  Noted pt is on full liquids, which is the consistency with highest potential for aspiration risk.  Unable to complete assessment today due to scheduling conflicts.  If warranted, please consider NPO status pending assessment tomorrow.  Thanks,          Blenda Mounts Laurice 08/09/2013, 4:11 PM

## 2013-08-09 NOTE — Progress Notes (Signed)
BP 111/46  Pulse 87  Temp(Src) 97.9 F (36.6 C) (Oral)  Resp 20  Ht 6' (1.829 m)  Wt 63 kg (138 lb 14.2 oz)  BMI 18.83 kg/m2  SpO2 96% - pt and family want to reverse DNR to partial code and will like to be treated. - resume meds. - check and SLP.

## 2013-08-10 DIAGNOSIS — F329 Major depressive disorder, single episode, unspecified: Secondary | ICD-10-CM

## 2013-08-10 DIAGNOSIS — F411 Generalized anxiety disorder: Secondary | ICD-10-CM

## 2013-08-10 LAB — GLUCOSE, CAPILLARY: Glucose-Capillary: 72 mg/dL (ref 70–99)

## 2013-08-10 LAB — BASIC METABOLIC PANEL
BUN: 29 mg/dL — ABNORMAL HIGH (ref 6–23)
CO2: 30 mEq/L (ref 19–32)
Calcium: 7.7 mg/dL — ABNORMAL LOW (ref 8.4–10.5)
Glucose, Bld: 67 mg/dL — ABNORMAL LOW (ref 70–99)
Potassium: 4.2 mEq/L (ref 3.5–5.1)
Sodium: 139 mEq/L (ref 135–145)

## 2013-08-10 MED ORDER — POTASSIUM CHLORIDE CRYS ER 20 MEQ PO TBCR
20.0000 meq | EXTENDED_RELEASE_TABLET | Freq: Once | ORAL | Status: AC
  Administered 2013-08-10: 13:00:00 20 meq via ORAL
  Filled 2013-08-10: qty 1

## 2013-08-10 MED ORDER — ENSURE COMPLETE PO LIQD
237.0000 mL | Freq: Four times a day (QID) | ORAL | Status: DC
  Administered 2013-08-10 (×2): 237 mL via ORAL

## 2013-08-10 NOTE — Progress Notes (Signed)
Patient ID:[name]      DOB: [birth year]      ZOX:[id number]  Chart reviewed.  Noted family changing status and some of decisions for treatment.  The Palliative Medicine Team will follow up goals of care with them in the am .  They are known to our team . See consult from 12/22  Roberto Gibbard L. Ladona Ridgel, MD MBA The Palliative Medicine Team at [facility] Phone: [phone] Pager: [phone]

## 2013-08-10 NOTE — Progress Notes (Signed)
Pt begun on bolus feedings of Ensure via NGT. Initial dose given slowly in two parts. Tolerating well, passing flatus.

## 2013-08-10 NOTE — Progress Notes (Signed)
SLP Cancellation Note  Patient Details Name: [name] MRN: [id number] DOB: 04-Jan-1935   Cancelled treatment: ST to complete BSE on 08/11/13. Moreen Fowler MS, CCC-SLP [phone] [facility] 08/10/2013, 5:25 PM

## 2013-08-10 NOTE — Progress Notes (Signed)
[facility]                                                                                Patient Demographics  [name], is a 78 y.o. male, DOB - 11-13-1934, JYN:[id number]  Admit date - 07/18/2013   Admitting Physician Therisa Doyne, MD  Outpatient Primary MD for the patient is [name], MD  LOS - 23   Chief Complaint  Patient presents with  . Emesis        Assessment & Plan  Principal Problem:   Intestinal ischemia and infarction Active Problems:   COPD   HTN (hypertension)   PVD (peripheral vascular disease) with claudication   Systolic CHF   Dehydration   Abdominal pain, epigastric   Gastric ulcer   Partial gastric outlet obstruction   Protein-calorie malnutrition, severe   Gastroparesis   Other specified gastritis without mention of hemorrhage   Acute respiratory failure   Hypovolemic shock   E. coli pneumonia   Palliative care encounter  Sirs due to Intestinal ischemia and infarction s/p extensive SB resection with Shock liver -12/8 Exploratory laparotomy with entrectomy (patient required pressor and TNA which could not be tolerated) -No longer having nausea/vomiting -Continue Remeron and reglan -Shock liver, resolved, LFTs improved -NGTube in place, will start feeds, pending speech eval  Acute resp failure due to COPD Asp PNA ( E. Coli PNA) and Resp + metab alkalosis/Required intubation for Rt AKA 12/14.  - Hold lasix. Watch strict I and O's. Not eating becoming hyponatremia.  - Off bipap. moniotr electrolytes replete as needed.  - completed course of antibiotics.   CAD/ ischemic cardiomyopathy -EF 35%  -Stable, Lasix being held  Vasculopathy -ischemic RLE > s/p R AKA 12/16  -Stable  NSTEMI  -Stable  A fib  -Continue amiodarone  Thrombocytopenia with Coagulopathy -With low fibrinogen, low grade DIC picture likely related to ischemic leg >> improved after amputation.  -SQ heparin since 12/19 for DVT prevention  -Follow  CBC  Nutrition -Currently has NG tube.  Will start feeds and wait for recommendations per speech. -Spoke with family regarding possible peg placement.  Code Status: Partial  Family Communication: Wife and daughter at bedside.  Disposition Plan: Admitted.  Spoke with patient's family regarding possible SNF placement and peg tube.     Procedures/Course 12/8 intubated, shock, surgical evaluation, DNR established, Exploratory laparotomy with entrectomy (majority of jejunum and proximal ileum)  12/9 Segmental small bowel resection terminal ileum Two anastomoses to re-establish intestinal continuity  12/11 requires higher PEEP/FIO2  12/12 s/p R LE endarterectomy (Dr Early)  12/16 To OR for Rt AKA, returned intubated with 80% fio2  07/30/13 - EXTUBATED  12/18 intermittent NIMVS on 70% NRB  08/02/13: using bipap prn self directed for resp comfort. Good diuresis with lasix but RN says still sounds wet. Still hard to wean off NRB. Otherwise ok. Wants to go home    Consults   Vascular  Surgery  PCCM  GI  DVT Prophylaxis   Heparin   Lab Results  Component Value Date   PLT 226 08/09/2013    Medications  Scheduled Meds: . amiodarone  200 mg Oral Daily  . arformoterol  15 mcg Nebulization BID  . feeding supplement (ENSURE COMPLETE)  237 mL Oral BID BM  . feeding supplement (ENSURE COMPLETE)  237 mL Oral Q6H  . heparin subcutaneous  5,000 Units Subcutaneous Q8H  . LORazepam  0.5 mg Intravenous Once  . metoCLOPramide  5 mg Oral TID AC & HS  . mirtazapine  7.5 mg Oral QHS  . pantoprazole  40 mg Oral Daily  . potassium chloride  20 mEq Oral Once  . sertraline  100 mg Oral Daily   Continuous Infusions:  PRN Meds:.alum & mag hydroxide-simeth, fentaNYL, morphine CONCENTRATE, ondansetron  Antibiotics    Anti-infectives   Start     Dose/Rate Route Frequency Ordered Stop   07/29/13 2200  vancomycin (VANCOCIN) IVPB 750 mg/150 ml premix  Status:  Discontinued     750 mg 150 mL/hr  over 60 Minutes Intravenous Every 12 hours 07/29/13 1014 08/01/13 0941   07/25/13 1115  vancomycin (VANCOCIN) IVPB 1000 mg/200 mL premix     1,000 mg 200 mL/hr over 60 Minutes Intravenous To Surgery 07/25/13 1108 07/25/13 1111   07/22/13 2200  vancomycin (VANCOCIN) IVPB 1000 mg/200 mL premix  Status:  Discontinued     1,000 mg 200 mL/hr over 60 Minutes Intravenous Every 12 hours 07/22/13 2056 07/29/13 1014   07/21/13 1000  fluconazole (DIFLUCAN) IVPB 200 mg  Status:  Discontinued     200 mg 100 mL/hr over 60 Minutes Intravenous Every 24 hours 07/21/13 0944 08/04/13 0817   07/21/13 0945  metroNIDAZOLE (FLAGYL) IVPB 500 mg  Status:  Discontinued     500 mg 100 mL/hr over 60 Minutes Intravenous Every 8 hours 07/21/13 0933 07/25/13 0918   07/21/13 0945  piperacillin-tazobactam (ZOSYN) IVPB 3.375 g  Status:  Discontinued     3.375 g 12.5 mL/hr over 240 Minutes Intravenous 3 times per day 07/21/13 0944 08/04/13 0817   07/21/13 0800  vancomycin (VANCOCIN) IVPB 1000 mg/200 mL premix  Status:  Discontinued     1,000 mg 200 mL/hr over 60 Minutes Intravenous Every 12 hours 07/21/13 0626 07/22/13 1045   07/21/13 0615  ceFEPIme (MAXIPIME) 1 g in dextrose 5 % 50 mL IVPB  Status:  Discontinued     1 g 100 mL/hr over 30 Minutes Intravenous 3 times per day 07/21/13 0613 07/21/13 0919       Time Spent in minutes   30 minutes   Lancer Thurner D.O. on 08/10/2013 at 9:38 AM  Between 7am to 7pm - Pager - [phone]  After 7pm go to [email or web address] - password [email or web address]  And look for the night coverage person covering for me after hours  [facility] Office  [phone]    Subjective:   [name] seen and examined today.  He currently has no complaints. Patient denies dizziness, chest pain, shortness of breath, abdominal pain, N/V/D/C, new weakness, numbess, tingling.    Objective:   Filed Vitals:   08/09/13 1940 08/09/13 2019 08/10/13 0450 08/10/13 0846  BP:  112/47 108/46    Pulse:  97 95   Temp:  97.9 F (36.6 C) 97.3 F (36.3 C)   TempSrc:  Oral Oral   Resp:  18 18   Height:      Weight:   64.955 kg (143 lb 3.2 oz)   SpO2: 97% 97% 98% 95%    Wt Readings from Last 3 Encounters:  08/10/13 64.955 kg (143 lb 3.2 oz)  08/10/13 64.955 kg (143 lb 3.2 oz)  08/10/13 64.955 kg (143 lb  3.2 oz)     Intake/Output Summary (Last 24 hours) at 08/10/13 1610 Last data filed at 08/10/13 0700  Gross per 24 hour  Intake    280 ml  Output    800 ml  Net   -520 ml    Exam  General: Well developed, malnourished, NAD, appears stated age  HEENT: NCAT, PERRLA, EOMI, Anicteic Sclera, mucous membranes dry  Neck: Supple, no JVD, no masses  Cardiovascular: S1 S2 auscultated, no rubs, murmurs or gallops. Regular rate and rhythm.  Respiratory: Clear to auscultation bilaterally with equal chest rise  Abdomen: Soft, nontender, nondistended, + bowel sounds, staples clean  Extremities: warm dry without cyanosis clubbing or edema in LLE, RLE AKA, stump clean  Neuro: AAOx3, cranial nerves grossly intact.  Skin: no rashes  Data Review   Micro Results No results found for this or any previous visit (from the past 240 hour(s)).  Radiology Reports Ct Abdomen Pelvis W Contrast  08/08/2013   CLINICAL DATA:  Nausea and vomiting.  EXAM: CT ABDOMEN AND PELVIS WITH CONTRAST  TECHNIQUE: Multidetector CT imaging of the abdomen and pelvis was performed using the standard protocol following bolus administration of intravenous contrast.  CONTRAST:  OMNIPAQUE IOHEXOL 300 MG/ML SOLN. Hand injection was performed through central line without difficulty or complication.  COMPARISON:  CT abdomen/pelvis 07/21/2013  FINDINGS: Increased bilateral small to moderate pleural effusions are noted with associated compressive atelectasis. Portion of collapsed right lower lobe is hypodense centrally which could indicate necrosis or fluid/drowned lung. Heart size is mildly enlarged.   Wedge-shaped areas of peripheral splenic hypoenhancement are reidentified, unchanged since the prior exam, which again could represent infarcts or vascular perfusion phenomenon.  Layering sludge or stones noted within the gallbladder. Liver, right adrenal gland, right kidney, and pancreas appear normal. 2.5 cm mixed density left adrenal mass is stable in appearance. Dense atheromatous calcification of the non aneurysmal abdominal aorta and branch vessels again noted, with renal arterial stents in place. Minimal infrarenal abdominal aortic ectasia is stable.  The patient has has interval midline abdominal wall incision compatible with laparotomy. Trace pelvic fluid is identified. Colonic diverticuli noted without evidence for diverticulitis. Bladder wall thickness at upper limits of normal with internal air-fluid level suggesting recent catheterization. Since the prior exam, small bowel dilatation has resolved. No new area of bowel wall thickening or focal segmental dilatation is identified.  No new acute suboptimal suspect no new acute osseous abnormality. Lumbar spine degenerative change is present.  IMPRESSION: Interval resolution of previously seen small bowel dilatation after apparent midline laparotomy. Trace pelvic fluid is likely postoperative.  No new intra-abdominal or pelvic pathology.  Persistent apparent perfusion phenomenon in the spleen with wedge-shaped hypoperfused regions which could indicate infarct, unchanged.  Increased bilateral lower lobe consolidation, right greater than left, with areas of hypodensity within the right lower lobe collapse segment which could represent necrosis in the setting of pneumonia, drowned lung, or aspiration of lipoid material given the internal low-density.   Electronically Signed   By: Christiana Pellant M.D.   On: 08/08/2013 16:01   Ct Abdomen Pelvis W Contrast  07/18/2013   CLINICAL DATA:  Vomiting. Recently discharged after being admitted for peptic ulcers.  EXAM:  CT ABDOMEN AND PELVIS WITH CONTRAST  TECHNIQUE: Multidetector CT imaging of the abdomen and pelvis was performed using the standard protocol following bolus administration of intravenous contrast.  CONTRAST:  OMNIPAQUE IOHEXOL 300 MG/ML  SOLN  COMPARISON:  06/17/2013.  FINDINGS: Multiple sigmoid colon  diverticula are again demonstrated without evidence of diverticulitis. Multiple normal caliber fluid-filled loops of small bowel. Diffuse low density wall thickening in the duodenal bulb, measuring 1.2 cm in thickness on image number 26. This is producing moderate luminal narrowing. Tiny gallstones in the gallbladder. The largest measures 4 mm in maximum diameter. No gallbladder wall thickening or pericholecystic fluid.  Inhomogeneous enhancement of the liver and spleen with no discrete liver mass seen. 2.6 x 2.1 cm left adrenal mass with low density components. This was previously shown to measure 5 Hounsfield units without intravenous contrast, compatible with a benign adenoma. Small left renal cysts. Unremarkable right kidney, urinary bladder and prostate gland. No enlarged lymph nodes. No evidence of appendicitis. Dense atheromatous arterial calcifications. Bilateral renal artery stents. The no significant change in mild bibasilar atelectasis/scarring. Mild lumbar lower thoracic spine degenerative changes.  IMPRESSION: 1. Do tonight is involving the duodenal bulb with moderate luminal narrowing. 2. Normal caliber fluid-filled small bowel loops. This can be seen with gastroenteritis. 3. Cholelithiasis without evidence of cholecystitis. 4. Extensive arterial calcifications with bilateral renal artery stents. 5. Sigmoid diverticulosis. 6. Stable left adrenal adenoma.   Electronically Signed   By: Gordan Payment M.D.   On: 07/18/2013 18:27   Nm Hepato W/eject Fract  07/11/2013   CLINICAL DATA:  Pain.  Nausea and vomiting.  EXAM: NUCLEAR MEDICINE HEPATOBILIARY IMAGING WITH GALLBLADDER EF  TECHNIQUE: Sequential  images of the abdomen were obtained out to 60 minutes following intravenous administration of radiopharmaceutical. After slow intravenous infusion of 1.67 micrograms Cholecystokinin, gallbladder ejection fraction was determined.  COMPARISON:  Ultrasound 07/08/2013.  RADIOPHARMACEUTICALS:  69mCiTc-56m Choletec  FINDINGS: Liver, gallbladder, biliary system, and bowel appear normal. At 30 min, normal ejection fraction is greater than 30%.  The patient did not experience symptoms during CCK infusion.  IMPRESSION: Normal exam.   Electronically Signed   By: Maisie Fus  Register   On: 07/11/2013 12:50   Dg Chest Port 1 View  08/06/2013   CLINICAL DATA:  Pulmonary edema  EXAM: PORTABLE CHEST - 1 VIEW  COMPARISON:  08/02/2013.  FINDINGS: The cardiac silhouette is enlarged. And left internal jugular central venous catheter is appreciated with tip projecting in the region superior vena cava. Patient is status post median sternotomy and coronary artery bypass grafting. Atherosclerotic calcifications identified within the aorta. Diffuse bilateral pulmonary opacities are appreciated with greatest confluence of the right lung base. When compared to the previous study there has been decreased density and conspicuity of the findings in the right lung base. There is a consolidative component within the right lung base may represent a component of a small effusion. The osseous structures unremarkable.  IMPRESSION: Slightly more prominent interstitial infiltrate again consistent with pulmonary edema. Focal infiltrate versus atelectasis right lung base is well small effusion. Continued surveillance evaluation recommended. Support line unchanged.   Electronically Signed   By: Salome Holmes M.D.   On: 08/06/2013 08:05   Dg Chest Port 1 View  08/02/2013   CLINICAL DATA:  Pneumonia.  CHF.  EXAM: PORTABLE CHEST - 1 VIEW  COMPARISON:  08/01/2013.  FINDINGS: Left IJ central line is present with the tip in the upper SVC. Position is  unchanged compared to yesterday. Monitoring leads project over the chest. The appearance of the lungs is slightly worse with interstitial and airspace opacities and more focal consolidation in the right midlung which was present on prior exams consistent with scarring. Cardiopericardial silhouette remains enlarged.  IMPRESSION: 1. Stable support apparatus. 2. Slight interval  increase in interstitial and airspace opacities likely representing pulmonary edema and CHF.   Electronically Signed   By: Andreas Newport M.D.   On: 08/02/2013 07:38   Dg Chest Port 1 View  08/01/2013   CLINICAL DATA:  Respiratory failure, right lower extremity amputation and bowel resection.  EXAM: PORTABLE CHEST - 1 VIEW  COMPARISON:  07/31/2013  FINDINGS: Overall worsening pattern of interstitial edema/ CHF. No pneumothorax is identified. Central line positioning is stable.  IMPRESSION: Worsening interstitial edema/ CHF.   Electronically Signed   By: Irish Lack M.D.   On: 08/01/2013 07:57   Dg Chest Portable 1 View  07/31/2013   CLINICAL DATA:  Respiratory failure.  EXAM: PORTABLE CHEST - 1 VIEW  COMPARISON:  One-view chest 07/30/2013.  FINDINGS: A left IJ line is stable in position. The patient has been extubated. The heart is enlarged. Atherosclerotic calcifications are again noted in the aortic arch. Bilateral pleural effusions and associated airspace disease is present. A diffuse interstitial pattern is unchanged. Airspace opacification at the level of the minor fissure is slightly more prominent.  IMPRESSION: 1. Slight increase an airspace opacification over the right midlung, likely in the superior segment of the right lower lobe. 2. Overall stable interstitial pattern, likely representing edema superimposed on chronic coarsening. 3. Status post extubation. 4. Persistent bilateral pleural effusions as the associated airspace disease, likely atelectasis.   Electronically Signed   By: Gennette Pac M.D.   On: 07/31/2013  07:57   Dg Chest Port 1 View  07/30/2013   CLINICAL DATA:  Respiratory failure and status post bowel resection for necrosis and right leg amputation.  EXAM: PORTABLE CHEST - 1 VIEW  COMPARISON:  07/29/2013  FINDINGS: Endotracheal tube remains with the tip approximately 5 cm above the carina. Lungs show improved aeration on the left with less prominent atelectasis/ infiltrates present. Airspace disease of the right lung is relatively stable. No pneumothorax or significant pleural effusions are seen. The heart size and mediastinal contours are stable.  IMPRESSION: Improved aeration of the left lung. Stable airspace disease of the right lung.   Electronically Signed   By: Irish Lack M.D.   On: 07/30/2013 08:07   Dg Chest Port 1 View  07/29/2013   CLINICAL DATA:  Evaluate endotracheal tube placement.  EXAM: PORTABLE CHEST - 1 VIEW  COMPARISON:  07/29/2013  FINDINGS: Endotracheal tube is 5.1 cm above the carina. Nasogastric tube extends into the abdomen but the tip is beyond the image. Jugular central venous catheter tip in the upper SVC region. Heart size is grossly normal. The thoracic aorta is heavily calcified. No change in the bilateral patchy airspace disease. Probable pleural fluid at the lung bases.  IMPRESSION: The endotracheal tube is appropriately position.  Minimal change in the bilateral parenchymal lung disease. Probable bilateral pleural effusions.  Atherosclerotic disease.   Electronically Signed   By: Richarda Overlie M.D.   On: 07/29/2013 13:02   Dg Chest Portable 1 View  07/29/2013   CLINICAL DATA:  Respiratory distress.  EXAM: PORTABLE CHEST - 1 VIEW  COMPARISON:  07/27/2013  FINDINGS: There is hyperinflation of the lungs compatible with COPD. Prior CABG. Left central line and NG tube are unchanged. Mild cardiomegaly. Bilateral airspace opacities and interstitial disease again noted. Small bilateral effusions are stable. No significant change since prior study.  IMPRESSION: COPD.  Stable  bilateral interstitial and alveolar opacities, likely edema.  Stable small bilateral effusions.   Electronically Signed   By: Charlett Nose  M.D.   On: 07/29/2013 08:15   Dg Chest Port 1 View  07/28/2013   CLINICAL DATA:  Respiratory distress, shortness of breath.  EXAM: PORTABLE CHEST - 1 VIEW  COMPARISON:  Chest radiograph July 27, 2013 at 6:24 a.m.  FINDINGS: Cardiac silhouette is scratch the upper limits of normal, status post median sternotomy for apparent Coronary artery bypass grafting. Increasing central pulmonary vasculature congestion, worsening interstitial prominence with new right lower lobe airspace opacity. Small to moderate right pleural effusion extending into the fissure. Small left pleural effusion.  Interval apparent extubation. Left internal jugular central venous catheter with distal tip projecting in proximal superior vena cava. Nasogastric tube in place with side port projecting at gastroesophageal junction. Right upper extremity catheter may reflect a PICC, distal tip appears to terminate in the right axilla and was present previously. Multiple EKG lines overlie the patient and may obscure subtle underlying pathology.  IMPRESSION: Worsening interstitial and alveolar airspace opacities may reflect fluid overload, with increasing small to moderate right, small left pleural effusions.  Apparent interval extubation without change in remaining life support lines.   Electronically Signed   By: Awilda Metro   On: 07/28/2013 00:13   Dg Chest Port 1 View  07/27/2013   CLINICAL DATA:  Check endotracheal tube placement  EXAM: PORTABLE CHEST - 1 VIEW  COMPARISON:  07/26/2013  FINDINGS: Cardiac shadow is stable. A left central venous line is again noted in the mid superior vena cava. The endotracheal tube is noted 6.6 cm above the carinal. A nasogastric catheter is seen within the stomach. Stable changes are noted in the bases bilaterally. No new focal abnormality is seen.  IMPRESSION: No  significant interval change from the prior exam.   Electronically Signed   By: Alcide Clever M.D.   On: 07/27/2013 08:09   Dg Chest Port 1 View  07/26/2013   CLINICAL DATA:  Followup pneumonia  EXAM: PORTABLE CHEST - 1 VIEW  COMPARISON:  07/25/2013  FINDINGS: A left-sided jugular central venous line is again noted in the proximal superior vena cava. The endotracheal tube and nasogastric catheter are stable in appearance and within normal position. The cardiac shadow is stable. Postsurgical changes are again noted. Increasing effusion is noted in the right base. The lungs are otherwise stable.  IMPRESSION: Increasing right basilar effusion.   Electronically Signed   By: Alcide Clever M.D.   On: 07/26/2013 07:21   Dg Chest Port 1 View  07/25/2013   CLINICAL DATA:  Respiratory failure and bowel resection.  EXAM: PORTABLE CHEST - 1 VIEW  COMPARISON:  07/24/2013  FINDINGS: Endotracheal tube tip approximately 4.5 cm above the carina. Central line positioning is stable. Lungs show improved aeration of both lower lobes. Underlying chronic lung disease is stable.  IMPRESSION: Improved aeration of both lower lobes.   Electronically Signed   By: Irish Lack M.D.   On: 07/25/2013 07:00   Dg Chest Port 1 View  07/24/2013   CLINICAL DATA:  Endotracheal tube position. Decreased oxygen saturations.  EXAM: PORTABLE CHEST - 1 VIEW  COMPARISON:  07/23/2013  FINDINGS: Endotracheal tube is in place with tip likely just above the carina. Consider withdrawing endotracheal tube approximately 2 cm. Nasogastric tube is in place with tip off the film but beyond the lower esophagus. Left IJ central line tip overlies the level of the superior vena cava. Bilateral lower lobe infiltrates/effusions again noted.  IMPRESSION: 1. Endotracheal tube tip just above the carina. 2. Bilateral lower lobe opacities,  similar in appearance to prior study. Findings are consistent with infiltrates and effusions.   Electronically Signed   By: Rosalie Gums M.D.   On: 07/24/2013 01:57   Dg Chest Port 1 View  07/23/2013   CLINICAL DATA:  Ventilated patient.  History of abdominal surgery.  EXAM: PORTABLE CHEST - 1 VIEW  COMPARISON:  07/22/2013  FINDINGS: Endotracheal tube is 4.9 cm above the carina. Jugular central venous catheter in the upper SVC region. Nasogastric tube extends into the abdomen. Increased densities in the right lower chest could represent developing airspace disease or asymmetric edema. Slightly increased densities at the left lung base suggest atelectasis and suspect small pleural effusions. Heart size is stable. No evidence for a pneumothorax.  IMPRESSION: Increased densities in the right lower chest are concerning for developing airspace disease or asymmetric edema.  Probable small pleural effusions.   Electronically Signed   By: Richarda Overlie M.D.   On: 07/23/2013 08:08   Dg Chest Port 1 View  07/22/2013   CLINICAL DATA:  Status post laparotomy and small bowel resection for bowel infarction.  EXAM: PORTABLE CHEST - 1 VIEW  COMPARISON:  07/21/2013.  FINDINGS: Endotracheal tube present with the tip approximately 5 cm above the carina. Central line tip lies in the upper SVC. Lungs show stable chronic disease without evidence of overt edema or airspace consolidation. The heart size is stable and within normal limits.  IMPRESSION: Stable chronic lung disease.   Electronically Signed   By: Irish Lack M.D.   On: 07/22/2013 10:41   Dg Chest Port 1 View  07/21/2013   CLINICAL DATA:  Endotracheal tube and line placement  EXAM: PORTABLE CHEST - 1 VIEW  COMPARISON:  07/18/2013  FINDINGS: Endotracheal tube tip lies 1.5 cm above the carina. A left internal jugular central venous line tip lies in the mid superior vena cava. A nasogastric tube has also been placed. Its tip lies at the GE junction. It will need to be further inserted to allow the tip to fully into the stomach.  No pneumothorax evident on this semi-erect study.  Lung  hyperexpansion, chronic interstitial thickening and areas of lung scarring and probable basilar subsegmental atelectasis are stable. No area of focal consolidation and no overt pulmonary edema.  IMPRESSION: 1. Endotracheal tube and left internal jugular central venous line are well positioned. No pneumothorax. 2. Nasogastric tube tip lies at the GE junction. Recommend further inserting tube 10-15 cm to allow the tip to fully into the stomach. 3. No change in the appearance of the lungs when allowing for differences in technique and patient positioning.   Electronically Signed   By: Amie Portland M.D.   On: 07/21/2013 10:39   Dg Chest Port 1 View  07/18/2013   CLINICAL DATA:  Vomiting.  Smoker.  EXAM: PORTABLE CHEST - 1 VIEW  COMPARISON:  07/06/2013.  FINDINGS: Normal sized heart. Post CABG changes. Decreased inspiration with minimal bibasilar atelectasis. Atheromatous arterial calcifications. Stable right basilar scarring. Stable prominence of the interstitial markings.  IMPRESSION: 1. Poor inspiration with minimal bibasilar atelectasis. 2. Stable changes of COPD with right basilar scarring.   Electronically Signed   By: Gordan Payment M.D.   On: 07/18/2013 14:28   Dg Abd Portable 1v  07/28/2013   CLINICAL DATA:  Nasogastric tube placement.  EXAM: PORTABLE ABDOMEN - 1 VIEW  COMPARISON:  07/21/2013 CT.  FINDINGS: Nasogastric tube tip distal esophagus level. This needs to be advanced.  Gas-filled slightly dilated small bowel  loops. This represents a significant improvement from the prior CT. Patient has had interval surgery.  Basilar atelectasis.  Calcified aorta branch vessels.  Common iliac stents in place.  Residual barium in the rectum.  The possibility of free intraperitoneal air cannot be assessed on a supine view.  IMPRESSION: Nasogastric tube tip distal esophagus level. This needs to be advanced.  Please see above.  This is a call report.   Electronically Signed   By: Bridgett Larsson M.D.   On: 07/28/2013  14:25   Ct Angio Abd/pel W/ And/or W/o  07/21/2013   CLINICAL DATA:  Epigastric abdominal pain, nausea, vomiting and weight loss. Endoscopy on 12/07 demonstrates severe erosive ulcerative gastritis as well as duodenitis and jejunitis.  EXAM: CT ANGIOGRAPHY ABDOMEN AND PELVIS WITH CONTRAST AND WITHOUT CONTRAST  TECHNIQUE: Multidetector CT imaging of the abdomen and pelvis was performed using the standard protocol during bolus administration of intravenous contrast. Multiplanar reconstructed images including MIPs were obtained and reviewed to evaluate the vascular anatomy.  CONTRAST:  OMNIPAQUE IOHEXOL 350 MG/ML SOLN  COMPARISON:  Standard CT of the abdomen and pelvis with contrast on 07/18/2013.  FINDINGS: The aorta and major visceral branches are heavily calcified with diffuse atherosclerosis present. The origin of the celiac axis shows critical narrowing and likely near subtotal occlusive disease. Distal branches are opacified and the celiac trunk is likely not completely occluded.  The proximal superior mesenteric artery shows heavily calcified plaque extending for a long distance down the trunk of the SMA. The proximal SMA trunk is completely occluded with reconstitution more distally by jejunal branches related to collateral reconstitution from celiac and IMA supplied. The inferior mesenteric artery origin is heavily calcified but open.  Bilateral renal artery stents are identified which are open. Bilateral common iliac artery stents are also present which are open. The external iliac arteries are diffusely diseased with heavily calcified plaque.  There is likely critical stenosis and near subtotal occlusion at the level of the mid right external iliac artery. Moderate narrowing of the distal external iliac artery is also present just above the inguinal ligament approaching 70-75% narrowing. At the level of the right groin, postsurgical changes are seen likely related to prior femoral bypass with an  occluded proximal bypass graft present. The native SFA is also occluded at its origin.  Diffuse disease of the left external iliac artery present without significant stenosis. The common femoral artery shows irregular plaque causing approximately 50% narrowing just below the inguinal ligament. Postsurgical changes are seen in the left groin with an open proximal bypass graft identified in the anterior thigh. The native SFA is occluded at its origin.  Nonvascular evaluation shows significant interval distension of small bowel loops which are diffusely dilated and fluid-filled. There may be some pneumatosis in the transverse duodenum and proximal jejunum as well, concerning for potential evolution of bowel ischemia to necrosis. The colon is completely decompressed. There is no evidence of free intraperitoneal air or focal abscess. The gallbladder shows some increased distention since the prior study. There is no evidence of overt biliary obstruction, however. Stable left adrenal mass identified. This is fairly low density and likely an incidental adenoma.  Review of the MIP images confirms the above findings.  IMPRESSION: 1. 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 2. 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 3. Heavily calcified but  open inferior mesenteric artery. 4. 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 Postsurgical changes are evident in the right groin with occlusion of a previously placed proximal bypass graft. 5. Open left femoral bypass graft. 6. Significant interval distension of small bowel loops which are diffusely dilated and filled with fluid. There also may be a pneumatosis in the duodenum and proximal jejunum concerning for severe ischemia/necrosis. Surgical consultation would be recommended. Critical  Value/emergent results were called by telephone at the time of interpretation on 07/21/2013 at 8:00 AM to Dr.Prashant Thedore Mins, who verbally acknowledged these results.   Electronically Signed   By: Irish Lack M.D.   On: 07/21/2013 08:35    CBC  Recent Labs Lab 08/04/13 0400 08/05/13 0445 08/08/13 1704 08/09/13 0529  WBC 4.8 6.6 7.6 7.6  HGB 8.1* 8.5* 9.8* 9.7*  HCT 24.5* 25.0* 29.8* 30.2*  PLT 125* 158 232 226  MCV 89.7 89.6 92.0 93.5  MCH 29.7 30.5 30.2 30.0  MCHC 33.1 34.0 32.9 32.1  RDW 18.3* 18.0* 19.3* 19.8*  LYMPHSABS 0.6* 0.5*  --  1.4  MONOABS 0.2 0.3  --  0.5  EOSABS 0.0 0.0  --  0.1  BASOSABS 0.0 0.0  --  0.0    Chemistries   Recent Labs Lab 08/04/13 0400 08/05/13 0445  08/06/13 0430 08/06/13 2000 08/07/13 0430 08/08/13 0620 08/09/13 0529 08/10/13 0830  NA  --   --   < > 139 137 137 139 137 139  K  --   --   < > 2.8* 3.7 3.8 3.0* 3.4* 4.2  CL  --   --   < > 94* 94* 95* 95* 98 101  CO2  --   --   < > 38* 37* 35* 36* 34* 30  GLUCOSE  --   --   < > 131* 91 87 81 67* 67*  BUN  --   --   < > 45* 45* 43* 39* 30* 29*  CREATININE  --   --   < > 0.74 0.77 0.81 0.87 0.77 0.76  CALCIUM  --   --   < > 7.7* 7.7* 8.0* 8.0* 7.8* 7.7*  MG 2.0 1.8  --  1.9  --  1.9  --   --   --   AST  --   --   --   --   --   --   --  52*  --   ALT  --   --   --   --   --   --   --  98*  --   ALKPHOS  --   --   --   --   --   --   --  226*  --   BILITOT  --   --   --   --   --   --   --  1.8*  --   < > = values in this interval not displayed. ------------------------------------------------------------------------------------------------------------------ estimated creatinine clearance is 72.2 ml/min (by C-G formula based on Cr of 0.76). ------------------------------------------------------------------------------------------------------------------ No results found for this basename: HGBA1C,  in the last 72  hours ------------------------------------------------------------------------------------------------------------------ No results found for this basename: CHOL, HDL, LDLCALC, TRIG, CHOLHDL, LDLDIRECT,  in the last 72 hours ------------------------------------------------------------------------------------------------------------------ No results found for this basename: TSH, T4TOTAL, FREET3, T3FREE, THYROIDAB,  in the last 72 hours ------------------------------------------------------------------------------------------------------------------ No results found for this basename: VITAMINB12, FOLATE, FERRITIN, TIBC, IRON, RETICCTPCT,  in the last 72 hours  Coagulation  profile No results found for this basename: INR, PROTIME,  in the last 168 hours  No results found for this basename: DDIMER,  in the last 72 hours  Cardiac Enzymes No results found for this basename: CK, CKMB, TROPONINI, MYOGLOBIN,  in the last 168 hours ------------------------------------------------------------------------------------------------------------------ No components found with this basename: POCBNP,

## 2013-08-11 DIAGNOSIS — R7309 Other abnormal glucose: Secondary | ICD-10-CM

## 2013-08-11 DIAGNOSIS — D509 Iron deficiency anemia, unspecified: Secondary | ICD-10-CM

## 2013-08-11 DIAGNOSIS — E872 Acidosis: Secondary | ICD-10-CM

## 2013-08-11 LAB — LACTIC ACID, PLASMA: Lactic Acid, Venous: 1.1 mmol/L (ref 0.5–2.2)

## 2013-08-11 LAB — GLUCOSE, CAPILLARY
Glucose-Capillary: 125 mg/dL — ABNORMAL HIGH (ref 70–99)
Glucose-Capillary: 112 mg/dL — ABNORMAL HIGH (ref 70–99)
Glucose-Capillary: 92 mg/dL (ref 70–99)
Glucose-Capillary: 97 mg/dL (ref 70–99)
Glucose-Capillary: 84 mg/dL (ref 70–99)

## 2013-08-11 LAB — CBC
HCT: 28.5 % — ABNORMAL LOW (ref 39.0–52.0)
Hemoglobin: 9.4 g/dL — ABNORMAL LOW (ref 13.0–17.0)
MCHC: 33 g/dL (ref 30.0–36.0)
RDW: 20.6 % — ABNORMAL HIGH (ref 11.5–15.5)
WBC: 16.5 10*3/uL — ABNORMAL HIGH (ref 4.0–10.5)

## 2013-08-11 LAB — DIFFERENTIAL
Basophils Relative: 0 % (ref 0–1)
Lymphocytes Relative: 5 % — ABNORMAL LOW (ref 12–46)
Monocytes Absolute: 1 10*3/uL (ref 0.1–1.0)
Monocytes Relative: 6 % (ref 3–12)
Neutro Abs: 14.6 10*3/uL — ABNORMAL HIGH (ref 1.7–7.7)

## 2013-08-11 MED ORDER — PANTOPRAZOLE SODIUM 40 MG PO PACK
40.0000 mg | PACK | Freq: Every day | ORAL | Status: DC
  Administered 2013-08-11 – 2013-08-13 (×3): 40 mg
  Filled 2013-08-11 (×3): qty 20

## 2013-08-11 MED ORDER — SODIUM CHLORIDE 0.9 % IV SOLN
INTRAVENOUS | Status: DC
  Administered 2013-08-11 – 2013-08-12 (×2): via INTRAVENOUS

## 2013-08-11 MED ORDER — DEXTROSE 5 % IV SOLN
2.0000 g | Freq: Two times a day (BID) | INTRAVENOUS | Status: DC
  Administered 2013-08-11 – 2013-08-13 (×4): 2 g via INTRAVENOUS
  Filled 2013-08-11 (×7): qty 2

## 2013-08-11 NOTE — Progress Notes (Signed)
Progress Note from the Palliative Medicine Team at [facility]  Subjective:  Patient is lethargic but is able to follow commands and answer yes/no questiosn  -continued conversation regarding diagnosis, prognosis and GOC and options  -patient's wife, daughters, grandchildren and pastor present  -A detailed discussion was had today regarding  the difference between a aggressive medical intervention path  and a palliative comfort care path for this patient at this time was had.   -concepts related to mortality, limitations of medicine and the natural trajectory at EOL were discussed in detail, this was an emotional experience for family    Values and goals of care important to patient and family were attempted to be elicited.  Concept of Hospice and Palliative Care were discussed      Objective: No Known Allergies Scheduled Meds: . amiodarone  200 mg Oral Daily  . arformoterol  15 mcg Nebulization BID  . heparin subcutaneous  5,000 Units Subcutaneous Q8H  . LORazepam  0.5 mg Intravenous Once  . metoCLOPramide  5 mg Oral TID AC & HS  . mirtazapine  7.5 mg Oral QHS  . pantoprazole sodium  40 mg Per Tube Q1200  . sertraline  100 mg Oral Daily   Continuous Infusions:  PRN Meds:.alum & mag hydroxide-simeth, fentaNYL, morphine CONCENTRATE, ondansetron  BP 97/36  Pulse 97  Temp(Src) 97.7 F (36.5 C) (Oral)  Resp 18  Ht 6' (1.829 m)  Wt 65.137 kg (143 lb 9.6 oz)  BMI 19.47 kg/m2  SpO2 93%   PPS: 20 %  Pain Score: patient nods "no" to question of pain, but on gentle touch to the abdomen for exam he guards and withdraws     Intake/Output Summary (Last 24 hours) at 08/11/13 1610 Last data filed at 08/11/13 1300  Gross per 24 hour  Intake    300 ml  Output    400 ml  Net   -100 ml       Physical Exam:  General: ill appearing,  appears generally uncomfortable, frail/cachetic HEENT:  Noted ng tube, dry buccal membranes Chest:  Diminished in bases, CTA,  CVS: tachycardic-   rate 108 Abdomen: distended, firm, painful on gentle touch, decreased BS Ext: without edema, L AKA (painful to touch) Neuro: oriented to person and place, knows family members, answers appropriately to yes and no questions   Labs: CBC    Component Value Date/Time   WBC 16.5* 08/11/2013 0852   RBC 3.00* 08/11/2013 0852   RBC 3.14* 12/30/2009 0917   HGB 9.4* 08/11/2013 0852   HCT 28.5* 08/11/2013 0852   PLT 212 08/11/2013 0852   MCV 95.0 08/11/2013 0852   MCH 31.3 08/11/2013 0852   MCHC 33.0 08/11/2013 0852   RDW 20.6* 08/11/2013 0852   LYMPHSABS 0.9 08/11/2013 0852   MONOABS 1.0 08/11/2013 0852   EOSABS 0.0 08/11/2013 0852   BASOSABS 0.0 08/11/2013 0852    BMET    Component Value Date/Time   NA 139 08/10/2013 0830   K 4.2 08/10/2013 0830   CL 101 08/10/2013 0830   CO2 30 08/10/2013 0830   GLUCOSE 67* 08/10/2013 0830   BUN 29* 08/10/2013 0830   CREATININE 0.76 08/10/2013 0830   CALCIUM 7.7* 08/10/2013 0830   GFRNONAA 86* 08/10/2013 0830   GFRAA >90 08/10/2013 0830    CMP     Component Value Date/Time   NA 139 08/10/2013 0830   K 4.2 08/10/2013 0830   CL 101 08/10/2013 0830   CO2 30 08/10/2013 0830  GLUCOSE 67* 08/10/2013 0830   BUN 29* 08/10/2013 0830   CREATININE 0.76 08/10/2013 0830   CALCIUM 7.7* 08/10/2013 0830   PROT 5.0* 08/09/2013 0529   ALBUMIN 1.9* 08/09/2013 0529   AST 52* 08/09/2013 0529   ALT 98* 08/09/2013 0529   ALKPHOS 226* 08/09/2013 0529   BILITOT 1.8* 08/09/2013 0529   GFRNONAA 86* 08/10/2013 0830   GFRAA >90 08/10/2013 0830      Assessment and Plan: 1. Code Status:  Partial as documented 2. Symptom Control:          Pain: Fentanyl 12.5 mcg every 2 hrs IV prn      (discussed in detail with family pain management strategies)         Weakness/Failure to thrive 3. Psycho/Social:  Emotional support offered to family.  Many family members express mis-trust and frustration with medical treatment.  They express feelings that the medical  team is "giving up".  Education and support offerd 4. Spiritual:   Chaplain support 5. Disposition: Dependant on outcomes over the next 24-48 hrs  Patient Documents Completed or Given: Document Given Completed  Advanced Directives Pkt    MOST yes   DNR    Gone from My Sight    Hard Choices yes     Time In Time Out Total Time Spent with Patient Total Overall Time  1500 1630 90 min 90 min    Greater than 50%  of this time was spent counseling and coordinating care related to the above assessment and plan.  Lorinda Creed NP  Palliative Medicine Team Team Phone # [phone] Pager [phone]  Discussed with Dr Catha Gosselin 1

## 2013-08-11 NOTE — Progress Notes (Signed)
Report given to receiving RN. Patient is stable with no verbal complaints and no signs or symptoms of distress or discomfort.  

## 2013-08-11 NOTE — Evaluation (Signed)
Occupational Therapy Evaluation Patient Details Name: [name] MRN: [id number] DOB: [birth year] Today's Date: 08/11/2013 Time: 1035-1100 OT Time Calculation (min): 25 min  OT Assessment / Plan / Recommendation History of present illness 78 yo with ischemic cardiomyopathy, severe PAD admitted with abdominal pain.  CT abdomen demonstrated extensive ischemic changes. Was urgently intubated for shock and respiratory failure and transferred to ICU. Extubated then back to OR for R AKA 12/16 and reintubated. Unable to tolerate tube feeds.   Clinical Impression   Pt requires assistance for all mobility and total care for bathing, dressing at bed level.  Evaluation limited to bed level at RNs request due to abdominal pain and difficulty with its management.  Talked with pt and family about benefits of SNF and level of assist pt would need for a home d/c.  Palliative care meeting is scheduled for today.    OT Assessment  Patient needs continued OT Services    Follow Up Recommendations  SNF (family is considering home with home health)    Barriers to Discharge      Equipment Recommendations       Recommendations for Other Services    Frequency  Min 2X/week    Precautions / Restrictions Precautions Precautions: Fall (has abdominal wound) Precaution Comments: partial code, watch resp carefully   Pertinent Vitals/Pain Abdomen, did not rate, wincing with rolling, repositioned, RN aware    ADL  Eating/Feeding: NPO Grooming: Wash/dry hands;Wash/dry face;Moderate assistance Where Assessed - Grooming: Supine, head of bed up Upper Body Bathing: +1 Total assistance Where Assessed - Upper Body Bathing: Supine, head of bed up;Rolling right and/or left Lower Body Bathing: +1 Total assistance Where Assessed - Lower Body Bathing: Supine, head of bed up;Rolling right and/or left Upper Body Dressing: Moderate assistance Where Assessed - Upper Body Dressing: Supine, head of bed up Lower Body  Dressing: +1 Total assistance Where Assessed - Lower Body Dressing: Supine, head of bed up;Rolling right and/or left Transfers/Ambulation Related to ADLs: RN deferring activity other than bed level. ADL Comments: Pt very quiet, intermittently closing his eyes. Answered questions with head nod.    OT Diagnosis: Generalized weakness;Acute pain;Blindness and low vision  OT Problem List: Decreased strength;Decreased activity tolerance;Impaired balance (sitting and/or standing);Decreased safety awareness;Decreased knowledge of use of DME or AE;Cardiopulmonary status limiting activity;Pain;Impaired vision/perception OT Treatment Interventions: Self-care/ADL training;Therapeutic exercise;DME and/or AE instruction;Therapeutic activities;Patient/family education;Balance training   OT Goals(Current goals can be found in the care plan section) Acute Rehab OT Goals Patient Stated Goal: did not state OT Goal Formulation: With patient/family Time For Goal Achievement: 08/25/13 Potential to Achieve Goals: Fair ADL Goals Pt Will Perform Grooming: with supervision;bed level Pt/caregiver will Perform Home Exercise Program: Increased strength;Both right and left upper extremity;With Supervision (AROM 10 reps x 2) Additional ADL Goal #1: Pt will perform bed mobility with min assist to assist with positioning for pressure relief and bathing.  Visit Information  Last OT Received On: 08/11/13 Assistance Needed: +2 History of Present Illness: 78 yo with ischemic cardiomyopathy, severe PAD admitted with abdominal pain.  CT abdomen demonstrated extensive ischemic changes. Was urgently intubated for shock and respiratory failure and transferred to ICU. Extubated then back to OR for R AKA 12/16 and reintubated. Currently on NRB and was on Bipap earlier today.       Prior Functioning     Home Living Family/patient expects to be discharged to:: Private residence Living Arrangements: Spouse/significant  other Available Help at Discharge: Family;Available 24 hours/day Type of Home: House  Home Access: Stairs to enter Entrance Stairs-Number of Steps: 1 Entrance Stairs-Rails: None Home Layout: One level Home Equipment: Walker - 2 wheels;Wheelchair - power;Tub bench Prior Function Level of Independence: Independent with assistive device(s) Comments: From previous encounter notes, Dtr reports that pt with limited activity.  He performed self care activities, but only walked from bedroom to kitchen to bathroom Communication Communication:  (pt with minimal conversation) Dominant Hand: Right         Vision/Perception Vision - History Baseline Vision: Legally blind Patient Visual Report:  (can only see gross figures) Vision - Assessment Additional Comments: longstanding low vision, sees only outlines of figures   Cognition  Cognition Arousal/Alertness: Awake/alert Behavior During Therapy: Flat affect Overall Cognitive Status: Within Functional Limits for tasks assessed    Extremity/Trunk Assessment Upper Extremity Assessment Upper Extremity Assessment: Generalized weakness Lower Extremity Assessment Lower Extremity Assessment: Defer to PT evaluation RLE Deficits / Details: R AKA     Mobility Bed Mobility Bed Mobility: Rolling Right;Rolling Left;Sitting - Scoot to Edge of Bed Rolling Right: 3: Mod assist;With rail Rolling Left: 3: Mod assist;With rail Scooting to HOB: 1: +2 Total assist Scooting to HOB: Patient Percentage: 0% Details for Bed Mobility Assistance: Pt with wincing when rolling due to abdominal pain, verbal cues and physical guidance to reach rail Transfers Transfers: Not assessed     Exercise     Balance     End of Session OT - End of Session Activity Tolerance: Patient limited by pain;Patient limited by fatigue Patient left: in bed;with call bell/phone within reach;with family/visitor present Nurse Communication: Other (comment) (RN deferring EOB at this  time)  GO     Evern Bio 08/11/2013, 12:25 PM [phone]

## 2013-08-11 NOTE — Progress Notes (Signed)
Removed staples to abdomen. Patient tolerated it well.

## 2013-08-11 NOTE — Progress Notes (Signed)
Possible ongoing mesenteric ischemia  This patient is not a candidate for further abdominal surgery - he already has a minimum amount of remaining bowel.   Agree with palliative care.  Wilmon Arms. Corliss Skains, MD, [facility] Surgery  General/ Trauma Surgery  08/11/2013 3:47 PM

## 2013-08-11 NOTE — Progress Notes (Addendum)
NUTRITION FOLLOW UP/CONSULT  DOCUMENTATION CODES Per approved criteria  -Severe malnutrition in the context of chronic illness   Intervention:   Discontinue Ensure Complete. If pt requires enteral nutrition, recommend initiation of Osmolite 1.2 at 25 ml/hr via NGT, advance by 10 ml q 6 hours, to goal of 75 ml/hr. This regimen will provide: 2160 kcal, 101 grams protein, 1476 ml free water. If pt is unable to tolerate this formula, recommend switching to elemental formula, Vital AF 1.2, initiate at same rate and advancement as above. Recommend free water flushes of 150 ml QID, this will provide an additional 600 ml free water daily. RD to follow for nutrition care plan  Nutrition Dx:   Inadequate oral intake now related to inability to eat as evidenced by NPO status -- ongoing.  Goal:   Intake to meet >90% of estimated nutrition needs.  Monitor:   PO intake, weight, labs, I/O's, TF tolerance, GOC  Assessment:   Patient with PMH of COPD, CVA and CAD; previous admitted with enteritis (thought to be Norovirus); presented again with ongoing nausea, occasional vomiting and one loose BM per day; + severe fatigue and abdominal pain  Patient s/p emergent procedure 12/8: EXPLORATORY LAPAROTOMY ENTRECTOMY (MAJORITY OF JEJUNUM & PROXIMAL ILEUM)  Patient s/p procedure 12/16: RIGHT ABOVE-KNEE AMPUTATION  Remeron and reglan started 12/26.  Pt with + s/s of aspiration. NGT placed 12/28 per family request. Pt currently ordered for bolus of Ensure via NGT. RN reports that pt had a feeding of Ensure yesterday but vomited after receiving it. Feedings are currently on hold, awaiting SLP eval.  Palliative Care Team met with family for goals of care 12/22, continues to follow patient. TPN discontinued on 12/23.  Potassium, magnesium and phosphorus are all WNL.  Height: Ht Readings from Last 1 Encounters:  08/06/13 6' (1.829 m)    Weight Status:   Wt Readings from Last 1 Encounters:  08/11/13  143 lb 9.6 oz (65.137 kg)  Admit wt 157 lb - wt trending down  Re-estimated needs:  Kcal: 2100-2300 Protein: 125-135 gm Fluid: per MD  Skin: Stage I pressure ulcer to sacrum  Diet Order: Full Liquid   Intake/Output Summary (Last 24 hours) at 08/11/13 0917 Last data filed at 08/11/13 0418  Gross per 24 hour  Intake    600 ml  Output    800 ml  Net   -200 ml    Last BM: 12/24  Labs:   Recent Labs Lab 08/05/13 0445 08/06/13 0430  08/07/13 0430 08/08/13 0620 08/09/13 0529 08/10/13 0830  NA  --  139  < > 137 139 137 139  K  --  2.8*  < > 3.8 3.0* 3.4* 4.2  CL  --  94*  < > 95* 95* 98 101  CO2  --  38*  < > 35* 36* 34* 30  BUN  --  45*  < > 43* 39* 30* 29*  CREATININE  --  0.74  < > 0.81 0.87 0.77 0.76  CALCIUM  --  7.7*  < > 8.0* 8.0* 7.8* 7.7*  MG 1.8 1.9  --  1.9  --   --   --   PHOS 4.6 3.8  --  3.1  --   --   --   GLUCOSE  --  131*  < > 87 81 67* 67*  < > = values in this interval not displayed.  CBG (last 3)   Recent Labs  08/10/13 1811 08/10/13 2354 08/11/13 1308  GLUCAP 100* 117* 125*    Scheduled Meds: . amiodarone  200 mg Oral Daily  . arformoterol  15 mcg Nebulization BID  . feeding supplement (ENSURE COMPLETE)  237 mL Oral BID BM  . feeding supplement (ENSURE COMPLETE)  237 mL Oral Q6H  . heparin subcutaneous  5,000 Units Subcutaneous Q8H  . LORazepam  0.5 mg Intravenous Once  . metoCLOPramide  5 mg Oral TID AC & HS  . mirtazapine  7.5 mg Oral QHS  . pantoprazole  40 mg Oral Daily  . sertraline  100 mg Oral Daily    Continuous Infusions:    Jarold Motto MS, RD, LDN Pager: [phone] After-hours pager: [phone]

## 2013-08-11 NOTE — Progress Notes (Signed)
[facility]                                                                                Patient Demographics  [name], is a 78 y.o. male, DOB - [birth year], FAO:[id number]  Admit date - 07/18/2013   Admitting Physician Therisa Doyne, MD  Outpatient Primary MD for the patient is [name], MD  LOS - 24   Chief Complaint  Patient presents with  . Emesis        Assessment & Plan  Principal Problem:   Intestinal ischemia and infarction Active Problems:   COPD   HTN (hypertension)   PVD (peripheral vascular disease) with claudication   Systolic CHF   Dehydration   Abdominal pain, epigastric   Gastric ulcer   Partial gastric outlet obstruction   Protein-calorie malnutrition, severe   Gastroparesis   Other specified gastritis without mention of hemorrhage   Acute respiratory failure   Hypovolemic shock   E. coli pneumonia   Palliative care encounter  Sirs due to Intestinal ischemia and infarction s/p extensive SB resection with Shock liver -12/8 Exploratory laparotomy with entrectomy (patient required pressor and TNA which could not be tolerated) -No longer having nausea/vomiting -Continue Remeron and reglan -Shock liver, resolved, LFTs improved -NGTube in place -Feeds held as patient is having abdominal pain. -Surgery to reconsulted for staple removal as well as continued abdominal pain. -? New ischemia, patient currently has abdominal pain as well as leukocytosis  Acute resp failure due to COPD Asp PNA ( E. Coli PNA) and Resp + metab alkalosis/Required intubation for Rt AKA 12/14.  - Hold lasix. Watch strict I and O's. - Off bipap. moniotr electrolytes replete as needed.  - completed course of antibiotics.   CAD/ ischemic cardiomyopathy -EF 35%  -Stable, Lasix being held  Vasculopathy -ischemic RLE > s/p R AKA 12/16  -Stable  NSTEMI  -Stable  A fib  -Continue amiodarone  Thrombocytopenia with Coagulopathy -With low fibrinogen, low grade DIC  picture likely related to ischemic leg >> improved after amputation.  -SQ heparin since 12/19 for DVT prevention  -Follow CBC   Nutrition -Currently has NG tube.   -Patient unable to tolerate feeds at this time. Will hold tube feeds.  Code Status: Partial  Family Communication: Wife at bedside.  Disposition Plan: Admitted.  Have asked surgery to see patient due to his abdominal pain as well as leukocytosis.  There is possibility of new ischemia. Palliative care meeting planned for today at 3 PM.   Procedures/Course 12/8 intubated, shock, surgical evaluation, DNR established, Exploratory laparotomy with entrectomy (majority of jejunum and proximal ileum)  12/9 Segmental small bowel resection terminal ileum Two anastomoses to re-establish intestinal continuity  12/11 requires higher PEEP/FIO2  12/12 s/p R LE endarterectomy (Dr Early)  12/16 To OR for Rt AKA, returned intubated with 80% fio2  07/30/13 - EXTUBATED  12/18 intermittent NIMVS on 70% NRB  08/02/13: using bipap prn self directed for resp comfort. Good diuresis with lasix but RN says still sounds wet. Still hard to wean off NRB. Otherwise ok. Wants to go home   Consults   Vascular  Surgery  PCCM  GI  DVT Prophylaxis  Heparin   Lab Results  Component Value Date   PLT 212 08/11/2013    Medications  Scheduled Meds: . amiodarone  200 mg Oral Daily  . arformoterol  15 mcg Nebulization BID  . heparin subcutaneous  5,000 Units Subcutaneous Q8H  . LORazepam  0.5 mg Intravenous Once  . metoCLOPramide  5 mg Oral TID AC & HS  . mirtazapine  7.5 mg Oral QHS  . pantoprazole sodium  40 mg Per Tube Q1200  . sertraline  100 mg Oral Daily   Continuous Infusions:  PRN Meds:.alum & mag hydroxide-simeth, fentaNYL, morphine CONCENTRATE, ondansetron  Antibiotics    Anti-infectives   Start     Dose/Rate Route Frequency Ordered Stop   07/29/13 2200  vancomycin (VANCOCIN) IVPB 750 mg/150 ml premix  Status:  Discontinued      750 mg 150 mL/hr over 60 Minutes Intravenous Every 12 hours 07/29/13 1014 08/01/13 0941   07/25/13 1115  vancomycin (VANCOCIN) IVPB 1000 mg/200 mL premix     1,000 mg 200 mL/hr over 60 Minutes Intravenous To Surgery 07/25/13 1108 07/25/13 1111   07/22/13 2200  vancomycin (VANCOCIN) IVPB 1000 mg/200 mL premix  Status:  Discontinued     1,000 mg 200 mL/hr over 60 Minutes Intravenous Every 12 hours 07/22/13 2056 07/29/13 1014   07/21/13 1000  fluconazole (DIFLUCAN) IVPB 200 mg  Status:  Discontinued     200 mg 100 mL/hr over 60 Minutes Intravenous Every 24 hours 07/21/13 0944 08/04/13 0817   07/21/13 0945  metroNIDAZOLE (FLAGYL) IVPB 500 mg  Status:  Discontinued     500 mg 100 mL/hr over 60 Minutes Intravenous Every 8 hours 07/21/13 0933 07/25/13 0918   07/21/13 0945  piperacillin-tazobactam (ZOSYN) IVPB 3.375 g  Status:  Discontinued     3.375 g 12.5 mL/hr over 240 Minutes Intravenous 3 times per day 07/21/13 0944 08/04/13 0817   07/21/13 0800  vancomycin (VANCOCIN) IVPB 1000 mg/200 mL premix  Status:  Discontinued     1,000 mg 200 mL/hr over 60 Minutes Intravenous Every 12 hours 07/21/13 0626 07/22/13 1045   07/21/13 0615  ceFEPIme (MAXIPIME) 1 g in dextrose 5 % 50 mL IVPB  Status:  Discontinued     1 g 100 mL/hr over 30 Minutes Intravenous 3 times per day 07/21/13 0613 07/21/13 0919       Time Spent in minutes   30 minutes   Demonta Wombles D.O. on 08/11/2013 at 11:59 AM  Between 7am to 7pm - Pager - [phone]  After 7pm go to [email or web address] - password [email or web address]  And look for the night coverage person covering for me after hours  [facility] Office  [phone]    Subjective:   [name] seen and examined today.  Complains of abdominal pain as well as an episode of vomiting last night.  Objective:   Filed Vitals:   08/11/13 0417 08/11/13 0614 08/11/13 0758 08/11/13 1006  BP:  103/46  96/40  Pulse:  102  110  Temp:  97.9 F (36.6 C)    TempSrc:   Axillary    Resp:  18    Height:      Weight: 65.137 kg (143 lb 9.6 oz)     SpO2:  95% 96%     Wt Readings from Last 3 Encounters:  08/11/13 65.137 kg (143 lb 9.6 oz)  08/11/13 65.137 kg (143 lb 9.6 oz)  08/11/13 65.137 kg (143 lb 9.6 oz)     Intake/Output Summary (Last 24  hours) at 08/11/13 1158 Last data filed at 08/11/13 0418  Gross per 24 hour  Intake    600 ml  Output    800 ml  Net   -200 ml    Exam  General: Well developed, malnourished, mild distress, appears stated age  HEENT: NCAT, PERRLA, EOMI, Anicteic Sclera, mucous membranes dry  Neck: Supple, no JVD, no masses  Cardiovascular: S1 S2 auscultated, no rubs, murmurs or gallops. Regular rate and rhythm.  Respiratory: Clear to auscultation bilaterally with equal chest rise  Abdomen: Soft, tender to palpation particularly on the right side, nondistended, + bowel sounds, staples clean  Extremities: warm dry without cyanosis clubbing or edema in LLE, RLE AKA, stump clean  Neuro: AAOx3, cranial nerves grossly intact.  Skin: no rashes  Data Review   Micro Results No results found for this or any previous visit (from the past 240 hour(s)).  Radiology Reports Ct Abdomen Pelvis W Contrast  08/08/2013   CLINICAL DATA:  Nausea and vomiting.  EXAM: CT ABDOMEN AND PELVIS WITH CONTRAST  TECHNIQUE: Multidetector CT imaging of the abdomen and pelvis was performed using the standard protocol following bolus administration of intravenous contrast.  CONTRAST:  OMNIPAQUE IOHEXOL 300 MG/ML SOLN. Hand injection was performed through central line without difficulty or complication.  COMPARISON:  CT abdomen/pelvis 07/21/2013  FINDINGS: Increased bilateral small to moderate pleural effusions are noted with associated compressive atelectasis. Portion of collapsed right lower lobe is hypodense centrally which could indicate necrosis or fluid/drowned lung. Heart size is mildly enlarged.  Wedge-shaped areas of peripheral splenic  hypoenhancement are reidentified, unchanged since the prior exam, which again could represent infarcts or vascular perfusion phenomenon.  Layering sludge or stones noted within the gallbladder. Liver, right adrenal gland, right kidney, and pancreas appear normal. 2.5 cm mixed density left adrenal mass is stable in appearance. Dense atheromatous calcification of the non aneurysmal abdominal aorta and branch vessels again noted, with renal arterial stents in place. Minimal infrarenal abdominal aortic ectasia is stable.  The patient has has interval midline abdominal wall incision compatible with laparotomy. Trace pelvic fluid is identified. Colonic diverticuli noted without evidence for diverticulitis. Bladder wall thickness at upper limits of normal with internal air-fluid level suggesting recent catheterization. Since the prior exam, small bowel dilatation has resolved. No new area of bowel wall thickening or focal segmental dilatation is identified.  No new acute suboptimal suspect no new acute osseous abnormality. Lumbar spine degenerative change is present.  IMPRESSION: Interval resolution of previously seen small bowel dilatation after apparent midline laparotomy. Trace pelvic fluid is likely postoperative.  No new intra-abdominal or pelvic pathology.  Persistent apparent perfusion phenomenon in the spleen with wedge-shaped hypoperfused regions which could indicate infarct, unchanged.  Increased bilateral lower lobe consolidation, right greater than left, with areas of hypodensity within the right lower lobe collapse segment which could represent necrosis in the setting of pneumonia, drowned lung, or aspiration of lipoid material given the internal low-density.   Electronically Signed   By: Christiana Pellant M.D.   On: 08/08/2013 16:01   Ct Abdomen Pelvis W Contrast  07/18/2013   CLINICAL DATA:  Vomiting. Recently discharged after being admitted for peptic ulcers.  EXAM: CT ABDOMEN AND PELVIS WITH CONTRAST   TECHNIQUE: Multidetector CT imaging of the abdomen and pelvis was performed using the standard protocol following bolus administration of intravenous contrast.  CONTRAST:  OMNIPAQUE IOHEXOL 300 MG/ML  SOLN  COMPARISON:  06/17/2013.  FINDINGS: Multiple sigmoid colon diverticula are  again demonstrated without evidence of diverticulitis. Multiple normal caliber fluid-filled loops of small bowel. Diffuse low density wall thickening in the duodenal bulb, measuring 1.2 cm in thickness on image number 26. This is producing moderate luminal narrowing. Tiny gallstones in the gallbladder. The largest measures 4 mm in maximum diameter. No gallbladder wall thickening or pericholecystic fluid.  Inhomogeneous enhancement of the liver and spleen with no discrete liver mass seen. 2.6 x 2.1 cm left adrenal mass with low density components. This was previously shown to measure 5 Hounsfield units without intravenous contrast, compatible with a benign adenoma. Small left renal cysts. Unremarkable right kidney, urinary bladder and prostate gland. No enlarged lymph nodes. No evidence of appendicitis. Dense atheromatous arterial calcifications. Bilateral renal artery stents. The no significant change in mild bibasilar atelectasis/scarring. Mild lumbar lower thoracic spine degenerative changes.  IMPRESSION: 1. Do tonight is involving the duodenal bulb with moderate luminal narrowing. 2. Normal caliber fluid-filled small bowel loops. This can be seen with gastroenteritis. 3. Cholelithiasis without evidence of cholecystitis. 4. Extensive arterial calcifications with bilateral renal artery stents. 5. Sigmoid diverticulosis. 6. Stable left adrenal adenoma.   Electronically Signed   By: Gordan Payment M.D.   On: 07/18/2013 18:27   Nm Hepato W/eject Fract  07/11/2013   CLINICAL DATA:  Pain.  Nausea and vomiting.  EXAM: NUCLEAR MEDICINE HEPATOBILIARY IMAGING WITH GALLBLADDER EF  TECHNIQUE: Sequential images of the abdomen were obtained out  to 60 minutes following intravenous administration of radiopharmaceutical. After slow intravenous infusion of 1.67 micrograms Cholecystokinin, gallbladder ejection fraction was determined.  COMPARISON:  Ultrasound 07/08/2013.  RADIOPHARMACEUTICALS:  29mCiTc-21m Choletec  FINDINGS: Liver, gallbladder, biliary system, and bowel appear normal. At 30 min, normal ejection fraction is greater than 30%.  The patient did not experience symptoms during CCK infusion.  IMPRESSION: Normal exam.   Electronically Signed   By: Maisie Fus  Register   On: 07/11/2013 12:50   Dg Chest Port 1 View  08/06/2013   CLINICAL DATA:  Pulmonary edema  EXAM: PORTABLE CHEST - 1 VIEW  COMPARISON:  08/02/2013.  FINDINGS: The cardiac silhouette is enlarged. And left internal jugular central venous catheter is appreciated with tip projecting in the region superior vena cava. Patient is status post median sternotomy and coronary artery bypass grafting. Atherosclerotic calcifications identified within the aorta. Diffuse bilateral pulmonary opacities are appreciated with greatest confluence of the right lung base. When compared to the previous study there has been decreased density and conspicuity of the findings in the right lung base. There is a consolidative component within the right lung base may represent a component of a small effusion. The osseous structures unremarkable.  IMPRESSION: Slightly more prominent interstitial infiltrate again consistent with pulmonary edema. Focal infiltrate versus atelectasis right lung base is well small effusion. Continued surveillance evaluation recommended. Support line unchanged.   Electronically Signed   By: Salome Holmes M.D.   On: 08/06/2013 08:05   Dg Chest Port 1 View  08/02/2013   CLINICAL DATA:  Pneumonia.  CHF.  EXAM: PORTABLE CHEST - 1 VIEW  COMPARISON:  08/01/2013.  FINDINGS: Left IJ central line is present with the tip in the upper SVC. Position is unchanged compared to yesterday. Monitoring  leads project over the chest. The appearance of the lungs is slightly worse with interstitial and airspace opacities and more focal consolidation in the right midlung which was present on prior exams consistent with scarring. Cardiopericardial silhouette remains enlarged.  IMPRESSION: 1. Stable support apparatus. 2. Slight interval increase in  interstitial and airspace opacities likely representing pulmonary edema and CHF.   Electronically Signed   By: Andreas Newport M.D.   On: 08/02/2013 07:38   Dg Chest Port 1 View  08/01/2013   CLINICAL DATA:  Respiratory failure, right lower extremity amputation and bowel resection.  EXAM: PORTABLE CHEST - 1 VIEW  COMPARISON:  07/31/2013  FINDINGS: Overall worsening pattern of interstitial edema/ CHF. No pneumothorax is identified. Central line positioning is stable.  IMPRESSION: Worsening interstitial edema/ CHF.   Electronically Signed   By: Irish Lack M.D.   On: 08/01/2013 07:57   Dg Chest Portable 1 View  07/31/2013   CLINICAL DATA:  Respiratory failure.  EXAM: PORTABLE CHEST - 1 VIEW  COMPARISON:  One-view chest 07/30/2013.  FINDINGS: A left IJ line is stable in position. The patient has been extubated. The heart is enlarged. Atherosclerotic calcifications are again noted in the aortic arch. Bilateral pleural effusions and associated airspace disease is present. A diffuse interstitial pattern is unchanged. Airspace opacification at the level of the minor fissure is slightly more prominent.  IMPRESSION: 1. Slight increase an airspace opacification over the right midlung, likely in the superior segment of the right lower lobe. 2. Overall stable interstitial pattern, likely representing edema superimposed on chronic coarsening. 3. Status post extubation. 4. Persistent bilateral pleural effusions as the associated airspace disease, likely atelectasis.   Electronically Signed   By: Gennette Pac M.D.   On: 07/31/2013 07:57   Dg Chest Port 1 View  07/30/2013    CLINICAL DATA:  Respiratory failure and status post bowel resection for necrosis and right leg amputation.  EXAM: PORTABLE CHEST - 1 VIEW  COMPARISON:  07/29/2013  FINDINGS: Endotracheal tube remains with the tip approximately 5 cm above the carina. Lungs show improved aeration on the left with less prominent atelectasis/ infiltrates present. Airspace disease of the right lung is relatively stable. No pneumothorax or significant pleural effusions are seen. The heart size and mediastinal contours are stable.  IMPRESSION: Improved aeration of the left lung. Stable airspace disease of the right lung.   Electronically Signed   By: Irish Lack M.D.   On: 07/30/2013 08:07   Dg Chest Port 1 View  07/29/2013   CLINICAL DATA:  Evaluate endotracheal tube placement.  EXAM: PORTABLE CHEST - 1 VIEW  COMPARISON:  07/29/2013  FINDINGS: Endotracheal tube is 5.1 cm above the carina. Nasogastric tube extends into the abdomen but the tip is beyond the image. Jugular central venous catheter tip in the upper SVC region. Heart size is grossly normal. The thoracic aorta is heavily calcified. No change in the bilateral patchy airspace disease. Probable pleural fluid at the lung bases.  IMPRESSION: The endotracheal tube is appropriately position.  Minimal change in the bilateral parenchymal lung disease. Probable bilateral pleural effusions.  Atherosclerotic disease.   Electronically Signed   By: Richarda Overlie M.D.   On: 07/29/2013 13:02   Dg Chest Portable 1 View  07/29/2013   CLINICAL DATA:  Respiratory distress.  EXAM: PORTABLE CHEST - 1 VIEW  COMPARISON:  07/27/2013  FINDINGS: There is hyperinflation of the lungs compatible with COPD. Prior CABG. Left central line and NG tube are unchanged. Mild cardiomegaly. Bilateral airspace opacities and interstitial disease again noted. Small bilateral effusions are stable. No significant change since prior study.  IMPRESSION: COPD.  Stable bilateral interstitial and alveolar opacities,  likely edema.  Stable small bilateral effusions.   Electronically Signed   By: Charlett Nose M.D.  On: 07/29/2013 08:15   Dg Chest Port 1 View  07/28/2013   CLINICAL DATA:  Respiratory distress, shortness of breath.  EXAM: PORTABLE CHEST - 1 VIEW  COMPARISON:  Chest radiograph July 27, 2013 at 6:24 a.m.  FINDINGS: Cardiac silhouette is scratch the upper limits of normal, status post median sternotomy for apparent Coronary artery bypass grafting. Increasing central pulmonary vasculature congestion, worsening interstitial prominence with new right lower lobe airspace opacity. Small to moderate right pleural effusion extending into the fissure. Small left pleural effusion.  Interval apparent extubation. Left internal jugular central venous catheter with distal tip projecting in proximal superior vena cava. Nasogastric tube in place with side port projecting at gastroesophageal junction. Right upper extremity catheter may reflect a PICC, distal tip appears to terminate in the right axilla and was present previously. Multiple EKG lines overlie the patient and may obscure subtle underlying pathology.  IMPRESSION: Worsening interstitial and alveolar airspace opacities may reflect fluid overload, with increasing small to moderate right, small left pleural effusions.  Apparent interval extubation without change in remaining life support lines.   Electronically Signed   By: Awilda Metro   On: 07/28/2013 00:13   Dg Chest Port 1 View  07/27/2013   CLINICAL DATA:  Check endotracheal tube placement  EXAM: PORTABLE CHEST - 1 VIEW  COMPARISON:  07/26/2013  FINDINGS: Cardiac shadow is stable. A left central venous line is again noted in the mid superior vena cava. The endotracheal tube is noted 6.6 cm above the carinal. A nasogastric catheter is seen within the stomach. Stable changes are noted in the bases bilaterally. No new focal abnormality is seen.  IMPRESSION: No significant interval change from the prior  exam.   Electronically Signed   By: Alcide Clever M.D.   On: 07/27/2013 08:09   Dg Chest Port 1 View  07/26/2013   CLINICAL DATA:  Followup pneumonia  EXAM: PORTABLE CHEST - 1 VIEW  COMPARISON:  07/25/2013  FINDINGS: A left-sided jugular central venous line is again noted in the proximal superior vena cava. The endotracheal tube and nasogastric catheter are stable in appearance and within normal position. The cardiac shadow is stable. Postsurgical changes are again noted. Increasing effusion is noted in the right base. The lungs are otherwise stable.  IMPRESSION: Increasing right basilar effusion.   Electronically Signed   By: Alcide Clever M.D.   On: 07/26/2013 07:21   Dg Chest Port 1 View  07/25/2013   CLINICAL DATA:  Respiratory failure and bowel resection.  EXAM: PORTABLE CHEST - 1 VIEW  COMPARISON:  07/24/2013  FINDINGS: Endotracheal tube tip approximately 4.5 cm above the carina. Central line positioning is stable. Lungs show improved aeration of both lower lobes. Underlying chronic lung disease is stable.  IMPRESSION: Improved aeration of both lower lobes.   Electronically Signed   By: Irish Lack M.D.   On: 07/25/2013 07:00   Dg Chest Port 1 View  07/24/2013   CLINICAL DATA:  Endotracheal tube position. Decreased oxygen saturations.  EXAM: PORTABLE CHEST - 1 VIEW  COMPARISON:  07/23/2013  FINDINGS: Endotracheal tube is in place with tip likely just above the carina. Consider withdrawing endotracheal tube approximately 2 cm. Nasogastric tube is in place with tip off the film but beyond the lower esophagus. Left IJ central line tip overlies the level of the superior vena cava. Bilateral lower lobe infiltrates/effusions again noted.  IMPRESSION: 1. Endotracheal tube tip just above the carina. 2. Bilateral lower lobe opacities, similar in appearance  to prior study. Findings are consistent with infiltrates and effusions.   Electronically Signed   By: Rosalie Gums M.D.   On: 07/24/2013 01:57   Dg  Chest Port 1 View  07/23/2013   CLINICAL DATA:  Ventilated patient.  History of abdominal surgery.  EXAM: PORTABLE CHEST - 1 VIEW  COMPARISON:  07/22/2013  FINDINGS: Endotracheal tube is 4.9 cm above the carina. Jugular central venous catheter in the upper SVC region. Nasogastric tube extends into the abdomen. Increased densities in the right lower chest could represent developing airspace disease or asymmetric edema. Slightly increased densities at the left lung base suggest atelectasis and suspect small pleural effusions. Heart size is stable. No evidence for a pneumothorax.  IMPRESSION: Increased densities in the right lower chest are concerning for developing airspace disease or asymmetric edema.  Probable small pleural effusions.   Electronically Signed   By: Richarda Overlie M.D.   On: 07/23/2013 08:08   Dg Chest Port 1 View  07/22/2013   CLINICAL DATA:  Status post laparotomy and small bowel resection for bowel infarction.  EXAM: PORTABLE CHEST - 1 VIEW  COMPARISON:  07/21/2013.  FINDINGS: Endotracheal tube present with the tip approximately 5 cm above the carina. Central line tip lies in the upper SVC. Lungs show stable chronic disease without evidence of overt edema or airspace consolidation. The heart size is stable and within normal limits.  IMPRESSION: Stable chronic lung disease.   Electronically Signed   By: Irish Lack M.D.   On: 07/22/2013 10:41   Dg Chest Port 1 View  07/21/2013   CLINICAL DATA:  Endotracheal tube and line placement  EXAM: PORTABLE CHEST - 1 VIEW  COMPARISON:  07/18/2013  FINDINGS: Endotracheal tube tip lies 1.5 cm above the carina. A left internal jugular central venous line tip lies in the mid superior vena cava. A nasogastric tube has also been placed. Its tip lies at the GE junction. It will need to be further inserted to allow the tip to fully into the stomach.  No pneumothorax evident on this semi-erect study.  Lung hyperexpansion, chronic interstitial thickening and  areas of lung scarring and probable basilar subsegmental atelectasis are stable. No area of focal consolidation and no overt pulmonary edema.  IMPRESSION: 1. Endotracheal tube and left internal jugular central venous line are well positioned. No pneumothorax. 2. Nasogastric tube tip lies at the GE junction. Recommend further inserting tube 10-15 cm to allow the tip to fully into the stomach. 3. No change in the appearance of the lungs when allowing for differences in technique and patient positioning.   Electronically Signed   By: Amie Portland M.D.   On: 07/21/2013 10:39   Dg Chest Port 1 View  07/18/2013   CLINICAL DATA:  Vomiting.  Smoker.  EXAM: PORTABLE CHEST - 1 VIEW  COMPARISON:  07/06/2013.  FINDINGS: Normal sized heart. Post CABG changes. Decreased inspiration with minimal bibasilar atelectasis. Atheromatous arterial calcifications. Stable right basilar scarring. Stable prominence of the interstitial markings.  IMPRESSION: 1. Poor inspiration with minimal bibasilar atelectasis. 2. Stable changes of COPD with right basilar scarring.   Electronically Signed   By: Gordan Payment M.D.   On: 07/18/2013 14:28   Dg Abd Portable 1v  07/28/2013   CLINICAL DATA:  Nasogastric tube placement.  EXAM: PORTABLE ABDOMEN - 1 VIEW  COMPARISON:  07/21/2013 CT.  FINDINGS: Nasogastric tube tip distal esophagus level. This needs to be advanced.  Gas-filled slightly dilated small bowel loops. This represents  a significant improvement from the prior CT. Patient has had interval surgery.  Basilar atelectasis.  Calcified aorta branch vessels.  Common iliac stents in place.  Residual barium in the rectum.  The possibility of free intraperitoneal air cannot be assessed on a supine view.  IMPRESSION: Nasogastric tube tip distal esophagus level. This needs to be advanced.  Please see above.  This is a call report.   Electronically Signed   By: Bridgett Larsson M.D.   On: 07/28/2013 14:25   Ct Angio Abd/pel W/ And/or W/o  07/21/2013    CLINICAL DATA:  Epigastric abdominal pain, nausea, vomiting and weight loss. Endoscopy on 12/07 demonstrates severe erosive ulcerative gastritis as well as duodenitis and jejunitis.  EXAM: CT ANGIOGRAPHY ABDOMEN AND PELVIS WITH CONTRAST AND WITHOUT CONTRAST  TECHNIQUE: Multidetector CT imaging of the abdomen and pelvis was performed using the standard protocol during bolus administration of intravenous contrast. Multiplanar reconstructed images including MIPs were obtained and reviewed to evaluate the vascular anatomy.  CONTRAST:  OMNIPAQUE IOHEXOL 350 MG/ML SOLN  COMPARISON:  Standard CT of the abdomen and pelvis with contrast on 07/18/2013.  FINDINGS: The aorta and major visceral branches are heavily calcified with diffuse atherosclerosis present. The origin of the celiac axis shows critical narrowing and likely near subtotal occlusive disease. Distal branches are opacified and the celiac trunk is likely not completely occluded.  The proximal superior mesenteric artery shows heavily calcified plaque extending for a long distance down the trunk of the SMA. The proximal SMA trunk is completely occluded with reconstitution more distally by jejunal branches related to collateral reconstitution from celiac and IMA supplied. The inferior mesenteric artery origin is heavily calcified but open.  Bilateral renal artery stents are identified which are open. Bilateral common iliac artery stents are also present which are open. The external iliac arteries are diffusely diseased with heavily calcified plaque.  There is likely critical stenosis and near subtotal occlusion at the level of the mid right external iliac artery. Moderate narrowing of the distal external iliac artery is also present just above the inguinal ligament approaching 70-75% narrowing. At the level of the right groin, postsurgical changes are seen likely related to prior femoral bypass with an occluded proximal bypass graft present. The native SFA is  also occluded at its origin.  Diffuse disease of the left external iliac artery present without significant stenosis. The common femoral artery shows irregular plaque causing approximately 50% narrowing just below the inguinal ligament. Postsurgical changes are seen in the left groin with an open proximal bypass graft identified in the anterior thigh. The native SFA is occluded at its origin.  Nonvascular evaluation shows significant interval distension of small bowel loops which are diffusely dilated and fluid-filled. There may be some pneumatosis in the transverse duodenum and proximal jejunum as well, concerning for potential evolution of bowel ischemia to necrosis. The colon is completely decompressed. There is no evidence of free intraperitoneal air or focal abscess. The gallbladder shows some increased distention since the prior study. There is no evidence of overt biliary obstruction, however. Stable left adrenal mass identified. This is fairly low density and likely an incidental adenoma.  Review of the MIP images confirms the above findings.  IMPRESSION: 1. 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 2. 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 3. Heavily calcified but open inferior mesenteric  artery. 4. 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 Postsurgical changes are evident in the right groin with occlusion of a previously placed proximal bypass graft. 5. Open left femoral bypass graft. 6. Significant interval distension of small bowel loops which are diffusely dilated and filled with fluid. There also may be a pneumatosis in the duodenum and proximal jejunum concerning for severe ischemia/necrosis. Surgical consultation would be recommended. Critical Value/emergent results were called by telephone at the time of  interpretation on 07/21/2013 at 8:00 AM to Dr.Prashant Thedore Mins, who verbally acknowledged these results.   Electronically Signed   By: Irish Lack M.D.   On: 07/21/2013 08:35    CBC  Recent Labs Lab 08/05/13 0445 08/08/13 1704 08/09/13 0529 08/11/13 0852  WBC 6.6 7.6 7.6 16.5*  HGB 8.5* 9.8* 9.7* 9.4*  HCT 25.0* 29.8* 30.2* 28.5*  PLT 158 232 226 212  MCV 89.6 92.0 93.5 95.0  MCH 30.5 30.2 30.0 31.3  MCHC 34.0 32.9 32.1 33.0  RDW 18.0* 19.3* 19.8* 20.6*  LYMPHSABS 0.5*  --  1.4 0.9  MONOABS 0.3  --  0.5 1.0  EOSABS 0.0  --  0.1 0.0  BASOSABS 0.0  --  0.0 0.0    Chemistries   Recent Labs Lab 08/05/13 0445  08/06/13 0430 08/06/13 2000 08/07/13 0430 08/08/13 0620 08/09/13 0529 08/10/13 0830  NA  --   < > 139 137 137 139 137 139  K  --   < > 2.8* 3.7 3.8 3.0* 3.4* 4.2  CL  --   < > 94* 94* 95* 95* 98 101  CO2  --   < > 38* 37* 35* 36* 34* 30  GLUCOSE  --   < > 131* 91 87 81 67* 67*  BUN  --   < > 45* 45* 43* 39* 30* 29*  CREATININE  --   < > 0.74 0.77 0.81 0.87 0.77 0.76  CALCIUM  --   < > 7.7* 7.7* 8.0* 8.0* 7.8* 7.7*  MG 1.8  --  1.9  --  1.9  --   --   --   AST  --   --   --   --   --   --  52*  --   ALT  --   --   --   --   --   --  98*  --   ALKPHOS  --   --   --   --   --   --  226*  --   BILITOT  --   --   --   --   --   --  1.8*  --   < > = values in this interval not displayed. ------------------------------------------------------------------------------------------------------------------ estimated creatinine clearance is 72.3 ml/min (by C-G formula based on Cr of 0.76). ------------------------------------------------------------------------------------------------------------------ No results found for this basename: HGBA1C,  in the last 72 hours ------------------------------------------------------------------------------------------------------------------  Recent Labs  08/11/13 0852  TRIG 100    ------------------------------------------------------------------------------------------------------------------ No results found for this basename: TSH, T4TOTAL, FREET3, T3FREE, THYROIDAB,  in the last 72 hours ------------------------------------------------------------------------------------------------------------------ No results found for this basename: VITAMINB12, FOLATE, FERRITIN, TIBC, IRON, RETICCTPCT,  in the last 72 hours  Coagulation profile No results found for this basename: INR, PROTIME,  in the last 168 hours  No results found for this basename: DDIMER,  in the last 72 hours  Cardiac Enzymes No results found for this basename: CK, CKMB, TROPONINI, MYOGLOBIN,  in the last 168 hours ------------------------------------------------------------------------------------------------------------------  No components found with this basename: POCBNP,

## 2013-08-11 NOTE — Progress Notes (Signed)
PT Cancellation Note  Patient Details Name: [name] MRN: [id number] DOB: [birth year]   Cancelled Treatment:     PT session cancelled per RN's request.  Pt with increased abdominal pain at this time.      Verdell Face, [place] [phone] 08/11/2013

## 2013-08-11 NOTE — Progress Notes (Signed)
[name], male   DOB: [birth year], 78 y.o.   MRN: [id number] 13 Days Post-Op  Subjective: We Have been asked to see this patient again who is well-known to [place] for significant SBR due to ischemia.  He is now having increasing abdominal pain.  He has emesis last night with his TFs.  He is becoming more hypotensive today and tachycardic.  His WBC has increased from 7 to 16.5K.  We have been asked to re-evaluate him for further recommendations.  Objective: Vital signs in last 24 hours: Temp:  [97.7 F (36.5 C)-98.1 F (36.7 C)] 97.9 F (36.6 C) (12/29 4098) Pulse Rate:  [102-110] 110 (12/29 1006) Resp:  [18-20] 18 (12/29 0614) BP: (96-111)/(40-49) 96/40 mmHg (12/29 1006) SpO2:  [93 %-96 %] 96 % (12/29 0758) Weight:  [143 lb 9.6 oz (65.137 kg)] 143 lb 9.6 oz (65.137 kg) (12/29 0417) Last BM Date: 08/06/13  Intake/Output from previous day: 12/28 0701 - 12/29 0700 In: 600 [NG/GT:600] Out: 800 [Urine:600; Emesis/NG output:200] Intake/Output this shift:    PE: Abd: soft, but very tender to palpation throughout.  Greatest on the right side.  Voluntary guarding.  Jumps just with placement of stethoscope on abdomen.  Few BS, ND, incision well healed. Will dc staples. Heart: tachy   Lab Results:   Recent Labs  08/09/13 0529 08/11/13 0852  WBC 7.6 16.5*  HGB 9.7* 9.4*  HCT 30.2* 28.5*  PLT 226 212   BMET  Recent Labs  08/09/13 0529 08/10/13 0830  NA 137 139  K 3.4* 4.2  CL 98 101  CO2 34* 30  GLUCOSE 67* 67*  BUN 30* 29*  CREATININE 0.77 0.76  CALCIUM 7.8* 7.7*   PT/INR No results found for this basename: LABPROT, INR,  in the last 72 hours CMP     Component Value Date/Time   NA 139 08/10/2013 0830   K 4.2 08/10/2013 0830   CL 101 08/10/2013 0830   CO2 30 08/10/2013 0830   GLUCOSE 67* 08/10/2013 0830   BUN 29* 08/10/2013 0830   CREATININE 0.76 08/10/2013 0830   CALCIUM 7.7* 08/10/2013 0830   PROT 5.0* 08/09/2013 0529   ALBUMIN 1.9* 08/09/2013 0529    AST 52* 08/09/2013 0529   ALT 98* 08/09/2013 0529   ALKPHOS 226* 08/09/2013 0529   BILITOT 1.8* 08/09/2013 0529   GFRNONAA 86* 08/10/2013 0830   GFRAA >90 08/10/2013 0830   Lipase     Component Value Date/Time   LIPASE 11 07/21/2013 1010       Studies/Results: No results found.  Anti-infectives: Anti-infectives   Start     Dose/Rate Route Frequency Ordered Stop   07/29/13 2200  vancomycin (VANCOCIN) IVPB 750 mg/150 ml premix  Status:  Discontinued     750 mg 150 mL/hr over 60 Minutes Intravenous Every 12 hours 07/29/13 1014 08/01/13 0941   07/25/13 1115  vancomycin (VANCOCIN) IVPB 1000 mg/200 mL premix     1,000 mg 200 mL/hr over 60 Minutes Intravenous To Surgery 07/25/13 1108 07/25/13 1111   07/22/13 2200  vancomycin (VANCOCIN) IVPB 1000 mg/200 mL premix  Status:  Discontinued     1,000 mg 200 mL/hr over 60 Minutes Intravenous Every 12 hours 07/22/13 2056 07/29/13 1014   07/21/13 1000  fluconazole (DIFLUCAN) IVPB 200 mg  Status:  Discontinued     200 mg 100 mL/hr over 60 Minutes Intravenous Every 24 hours 07/21/13 0944 08/04/13 0817   07/21/13 0945  metroNIDAZOLE (FLAGYL) IVPB 500 mg  Status:  Discontinued     500 mg 100 mL/hr over 60 Minutes Intravenous Every 8 hours 07/21/13 0933 07/25/13 0918   07/21/13 0945  piperacillin-tazobactam (ZOSYN) IVPB 3.375 g  Status:  Discontinued     3.375 g 12.5 mL/hr over 240 Minutes Intravenous 3 times per day 07/21/13 0944 08/04/13 0817   07/21/13 0800  vancomycin (VANCOCIN) IVPB 1000 mg/200 mL premix  Status:  Discontinued     1,000 mg 200 mL/hr over 60 Minutes Intravenous Every 12 hours 07/21/13 0626 07/22/13 1045   07/21/13 0615  ceFEPIme (MAXIPIME) 1 g in dextrose 5 % 50 mL IVPB  Status:  Discontinued     1 g 100 mL/hr over 30 Minutes Intravenous 3 times per day 07/21/13 0613 07/21/13 0919       Assessment/Plan  1. S/p multiple ex laps for SBRs due to bowel ischemia 2. S/p right AKA 3. Horrible vascular disease 4.  Increasing leukocytosis 5. Worsening abdominal pain  Plan: 1. Will dc staples 2. I am concerned given the patient's incredibly poor vascular situation to his gut, that he may be having some new ischemia to the remaining portion of his intestines.  His WBC is beginning to trend up and he is becoming more tachy and a little more hypotensive.  I can not confirm my suspicion at this point, but time will declare whether this is occuring or not.  If this were to be the case, I have reiterated with the wife what Dr. Gerrit Friends expressed to the entire family after his last operation, which was: If any more of his bowel dies, he will not be able to undergo any further operations with bowel resections as he will not have enough bowel in order to live.  She nods yes to this understanding.  Palliative care is already planning another meeting this afternoon at 3pm for further discussions as well.  I'm not sure a CT scan is needed right now as it will not likely change, at least our surgical management. I will d/w Dr. Corliss Skains though. 3. I have spoken to Dr. Catha Gosselin about this patient and Corrie Dandy, NP with palliative care medicine.   4. Hold TFs for now.    LOS: 24 days    Deby Adger E 08/11/2013, 10:35 AM Pager: [phone]

## 2013-08-11 NOTE — Progress Notes (Signed)
SLP Cancellation Note  Patient Details Name: [name] MRN: [id number] DOB: [birth year]   Cancelled treatment:        Per discussion with RN and chart review, patient with complaints of abdominal pain as well as recent episode of vomiting and a pending palliative care consult today.  Given recent GI issues SLP spoke with MD with request for her to advise prior to bedside evaluation.  Per MD, evaluation deferred until 12/30 after meeting.  SLP to follow up tomorrow pending patient and family goals of care.      Fae Pippin, M.A., CCC-SLP [phone]  [name] 08/11/2013, 4:59 PM

## 2013-08-12 ENCOUNTER — Inpatient Hospital Stay (HOSPITAL_COMMUNITY): Payer: Medicare Other

## 2013-08-12 DIAGNOSIS — K559 Vascular disorder of intestine, unspecified: Secondary | ICD-10-CM

## 2013-08-12 DIAGNOSIS — I959 Hypotension, unspecified: Secondary | ICD-10-CM

## 2013-08-12 LAB — CBC
MCH: 31 pg (ref 26.0–34.0)
MCHC: 32.6 g/dL (ref 30.0–36.0)
MCV: 95.1 fL (ref 78.0–100.0)
Platelets: 197 10*3/uL (ref 150–400)
RDW: 21.1 % — ABNORMAL HIGH (ref 11.5–15.5)

## 2013-08-12 LAB — BASIC METABOLIC PANEL
CO2: 27 mEq/L (ref 19–32)
Calcium: 7.8 mg/dL — ABNORMAL LOW (ref 8.4–10.5)
Creatinine, Ser: 0.65 mg/dL (ref 0.50–1.35)
GFR calc non Af Amer: >90 mL/min (ref >90)
Sodium: 144 mEq/L (ref 137–147)

## 2013-08-12 LAB — GLUCOSE, CAPILLARY
Glucose-Capillary: 77 mg/dL (ref 70–99)
Glucose-Capillary: 88 mg/dL (ref 70–99)
Glucose-Capillary: 88 mg/dL (ref 70–99)
Glucose-Capillary: 89 mg/dL (ref 70–99)
Glucose-Capillary: 92 mg/dL (ref 70–99)

## 2013-08-12 LAB — CLOSTRIDIUM DIFFICILE BY PCR: Toxigenic C. Difficile by PCR: NEGATIVE

## 2013-08-12 MED ORDER — IOHEXOL 300 MG/ML  SOLN
80.0000 mL | Freq: Once | INTRAMUSCULAR | Status: AC | PRN
  Administered 2013-08-12: 80 mL via INTRAVENOUS

## 2013-08-12 MED ORDER — POTASSIUM CHLORIDE 10 MEQ/100ML IV SOLN
10.0000 meq | INTRAVENOUS | Status: AC
  Administered 2013-08-12 (×2): 10 meq via INTRAVENOUS
  Filled 2013-08-12 (×2): qty 100

## 2013-08-12 NOTE — Progress Notes (Signed)
SLP Cancellation Note  Patient Details Name: [name] MRN: [id number] DOB: 01/29/1935   Cancelled treatment:       Reason Eval/Treat Not Completed: Medical issues which prohibited therapy. Per chart pt is not tolerating tube feeds and has abdominal pain. Unlikely that pt is appropriate for PO intake. Will check chart tomorrow. Should SLP attempts continue? Should SLP sign off?  Harlon Ditty, MA CCC-SLP [phone]  Claudine Mouton 08/12/2013, 2:15 PM

## 2013-08-12 NOTE — Progress Notes (Signed)
14 Days Post-Op  Subjective: Patient awake, alert; complaining of right-sided abdominal pain. Small BM today Minimal NG output  Objective: Vital signs in last 24 hours: Temp:  [97.9 F (36.6 C)-98.5 F (36.9 C)] 97.9 F (36.6 C) (12/30 1413) Pulse Rate:  [88-96] 92 (12/30 1413) Resp:  [18] 18 (12/30 1413) BP: (95-105)/(34-44) 105/44 mmHg (12/30 1413) SpO2:  [93 %-96 %] 93 % (12/30 1413) Weight:  [128 lb 1.4 oz (58.1 kg)] 128 lb 1.4 oz (58.1 kg) (12/30 0424) Last BM Date: 08/12/13  Intake/Output from previous day: 12/29 0701 - 12/30 0700 In: 0  Out: 300 [Emesis/NG output:300] Intake/Output this shift:    General appearance: alert, cooperative, no distress and pale GI: minimally distended; tender in RUQ; no palpable masses Incision c/d/i  Lab Results:   Recent Labs  08/11/13 0852 08/12/13 0602  WBC 16.5* 17.8*  HGB 9.4* 8.8*  HCT 28.5* 27.0*  PLT 212 197   BMET  Recent Labs  08/10/13 0830 08/12/13 0602  NA 139 144  K 4.2 3.3*  CL 101 105  CO2 30 27  GLUCOSE 67* 70  BUN 29* 36*  CREATININE 0.76 0.65  CALCIUM 7.7* 7.8*   Hepatic Function Panel     Component Value Date/Time   PROT 5.0* 08/09/2013 0529   ALBUMIN 1.9* 08/09/2013 0529   AST 52* 08/09/2013 0529   ALT 98* 08/09/2013 0529   ALKPHOS 226* 08/09/2013 0529   BILITOT 1.8* 08/09/2013 0529   BILIDIR <0.1 07/09/2013 1000   IBILI NOT CALCULATED 07/09/2013 1000     PT/INR No results found for this basename: LABPROT, INR,  in the last 72 hours ABG No results found for this basename: PHART, PCO2, PO2, HCO3,  in the last 72 hours  Studies/Results: Ct Abdomen Pelvis W Contrast  08/12/2013   CLINICAL DATA:  Abdominal pain for small bowel resection.  EXAM: CT ABDOMEN AND PELVIS WITH CONTRAST  TECHNIQUE: Multidetector CT imaging of the abdomen and pelvis was performed using the standard protocol following bolus administration of intravenous contrast.  CONTRAST:  80 cc Omnipaque 300 intravenous   COMPARISON:  08/08/2013  FINDINGS: BODY WALL: No complicating factors related to recent midline laparotomy incision.  LOWER CHEST: Coronary artery atherosclerosis. Bulky mitral annular calcification. The left ventricular cavity appears dilated with subendocardial low-attenuation which is chronic, likely from previous infarct. Mildly decreased consolidative opacities in the right with lower lobes. Small bilateral pleural effusions, with unchanged mildly increased density on the right.  ABDOMEN/PELVIS:  Liver: No focal abnormality.  Biliary: Distended gallbladder with layering stones. There is right upper quadrant ascites but no definitive pericholecystic fat infiltration.  Pancreas: Atrophy without focal abnormality.  Spleen: Similar extent of wedge-shaped hypo enhancement in the peripheral spleen compatible with previous infarctions in this patient with high-grade celiac axis stenosis.  Adrenals: 2.5 cm heterogeneous mass in the left adrenal is relatively stable since 2009, most consistent with adenoma on previous non contrast imaging.  Kidneys and ureters: Bilateral renal hilar calcifications which are likely atherosclerotic. No hydronephrosis. Low attenuating foci in the renal cortex are simple or too small to characterize.  Bladder: Gas in the bladder.  No Foley catheter currently present.  Bowel: Multiple enteroenterostomy shows no evidence of leakage. No abscess. As permitted by positive enteric contrast, No evidence of non enhancing bowel wall. No pneumatosis. Colon is diffusely distended, with fluid levels. Distal colonic diverticulosis. Nasogastric tube in good position.  Retroperitoneum: No mass or adenopathy.  Peritoneum: No free fluid or gas.  Vascular:  Extensive atherosclerosis. The degree of aortic branch vessels stenoses is better established on previous CT angiography. No definitive change from prior; proximal celiac and SMA stenosis is severe. Continued occlusion of the right superficial femoral  artery and femoral graft. Probable left SFA occlusion. Surgical clips near the right hepatic artery which is irregular in luminal contour.  OSSEOUS: No acute abnormalities.  IMPRESSION: 1. Distended colon suggesting ileus. 2. No evidence of leak or abscess status post recent small bowel section. 3. Cholelithiasis with dilated gallbladder. Correlate right upper quadrant symptoms. If concern for acute cholecystitis, recommend sonography. 4. Pneumaturia. Correlate with urinalysis. 5. Partial clearing of lower lobe pneumonia.   Electronically Signed   By: Tiburcio Pea M.D.   On: 08/12/2013 09:34    Anti-infectives: Anti-infectives   Start     Dose/Rate Route Frequency Ordered Stop   08/11/13 1800  ceFEPIme (MAXIPIME) 2 g in dextrose 5 % 50 mL IVPB     2 g 100 mL/hr over 30 Minutes Intravenous Every 12 hours 08/11/13 1713     07/29/13 2200  vancomycin (VANCOCIN) IVPB 750 mg/150 ml premix  Status:  Discontinued     750 mg 150 mL/hr over 60 Minutes Intravenous Every 12 hours 07/29/13 1014 08/01/13 0941   07/25/13 1115  vancomycin (VANCOCIN) IVPB 1000 mg/200 mL premix     1,000 mg 200 mL/hr over 60 Minutes Intravenous To Surgery 07/25/13 1108 07/25/13 1111   07/22/13 2200  vancomycin (VANCOCIN) IVPB 1000 mg/200 mL premix  Status:  Discontinued     1,000 mg 200 mL/hr over 60 Minutes Intravenous Every 12 hours 07/22/13 2056 07/29/13 1014   07/21/13 1000  fluconazole (DIFLUCAN) IVPB 200 mg  Status:  Discontinued     200 mg 100 mL/hr over 60 Minutes Intravenous Every 24 hours 07/21/13 0944 08/04/13 0817   07/21/13 0945  metroNIDAZOLE (FLAGYL) IVPB 500 mg  Status:  Discontinued     500 mg 100 mL/hr over 60 Minutes Intravenous Every 8 hours 07/21/13 0933 07/25/13 0918   07/21/13 0945  piperacillin-tazobactam (ZOSYN) IVPB 3.375 g  Status:  Discontinued     3.375 g 12.5 mL/hr over 240 Minutes Intravenous 3 times per day 07/21/13 0944 08/04/13 0817   07/21/13 0800  vancomycin (VANCOCIN) IVPB 1000 mg/200  mL premix  Status:  Discontinued     1,000 mg 200 mL/hr over 60 Minutes Intravenous Every 12 hours 07/21/13 0626 07/22/13 1045   07/21/13 0615  ceFEPIme (MAXIPIME) 1 g in dextrose 5 % 50 mL IVPB  Status:  Discontinued     1 g 100 mL/hr over 30 Minutes Intravenous 3 times per day 07/21/13 0613 07/21/13 0919      Assessment/Plan: s/p Procedure(s): AMPUTATION ABOVE KNEE (Right) No sign of abscess, leak, or ischemic complications of small bowel surgery. Cholelithiasis/ possible cholecystitis - which would correlate with his abdominal pain, mild ileus, and his abnormal LFT's from 12/27.  Ultrasound this afternoon.  If positive for acute cholecystitis, then recommend IR placing a percutaneous cholecystostomy tube as the patient is not a candidate for cholecystectomy at this time.  As ileus resolves, he may need further swallowing evaluation before resuming PO intake.   LOS: 25 days    Mckinnley Cottier K. 08/12/2013

## 2013-08-12 NOTE — Progress Notes (Signed)
Progress Note from the Palliative Medicine Team at [facility]  Subjective: patient is ill appearing, lethargic and generally appears uncomfortable  -large family at bedsdie  -continued emotional support, family verbalizes unrealistic expectation of medical interventions. Family are open to all available and offered medical interventions to prolong life, they remain hopeful for improvement    Objective: No Known Allergies Scheduled Meds: . amiodarone  200 mg Oral Daily  . arformoterol  15 mcg Nebulization BID  . ceFEPime (MAXIPIME) IV  2 g Intravenous Q12H  . heparin subcutaneous  5,000 Units Subcutaneous Q8H  . LORazepam  0.5 mg Intravenous Once  . metoCLOPramide  5 mg Oral TID AC & HS  . mirtazapine  7.5 mg Oral QHS  . pantoprazole sodium  40 mg Per Tube Q1200  . sertraline  100 mg Oral Daily   Continuous Infusions: . sodium chloride 50 mL/hr at 08/11/13 1645   PRN Meds:.alum & mag hydroxide-simeth, fentaNYL, morphine CONCENTRATE, ondansetron  BP 104/43  Pulse 88  Temp(Src) 98 F (36.7 C) (Axillary)  Resp 18  Ht 6' (1.829 m)  Wt 58.1 kg (128 lb 1.4 oz)  BMI 17.37 kg/m2  SpO2 96%   PPS:20 % at best    Intake/Output Summary (Last 24 hours) at 08/12/13 1332 Last data filed at 08/12/13 0810  Gross per 24 hour  Intake      0 ml  Output    300 ml  Net   -300 ml      LBM: 08-12-13    Physical Exam:  General: ill appearing, generally uncomfortable HEENT:  Noted Panda, + temporal muscle wasting,dry buccal membranes Chest:   diminished in bases  CTA CVS: RRR Abdomen: distended, firm, extremely tendeer to  Ext: without edema, noted R AKA Neuro: able to follow simple commands, letahrgic  Labs: CBC    Component Value Date/Time   WBC 17.8* 08/12/2013 0602   RBC 2.84* 08/12/2013 0602   RBC 3.14* 12/30/2009 0917   HGB 8.8* 08/12/2013 0602   HCT 27.0* 08/12/2013 0602   PLT 197 08/12/2013 0602   MCV 95.1 08/12/2013 0602   MCH 31.0 08/12/2013 0602   MCHC 32.6  08/12/2013 0602   RDW 21.1* 08/12/2013 0602   LYMPHSABS 0.9 08/11/2013 0852   MONOABS 1.0 08/11/2013 0852   EOSABS 0.0 08/11/2013 0852   BASOSABS 0.0 08/11/2013 0852    BMET    Component Value Date/Time   NA 144 08/12/2013 0602   K 3.3* 08/12/2013 0602   CL 105 08/12/2013 0602   CO2 27 08/12/2013 0602   GLUCOSE 70 08/12/2013 0602   BUN 36* 08/12/2013 0602   CREATININE 0.65 08/12/2013 0602   CALCIUM 7.8* 08/12/2013 0602   GFRNONAA >90 08/12/2013 0602   GFRAA >90 08/12/2013 0602    CMP     Component Value Date/Time   NA 144 08/12/2013 0602   K 3.3* 08/12/2013 0602   CL 105 08/12/2013 0602   CO2 27 08/12/2013 0602   GLUCOSE 70 08/12/2013 0602   BUN 36* 08/12/2013 0602   CREATININE 0.65 08/12/2013 0602   CALCIUM 7.8* 08/12/2013 0602   PROT 5.0* 08/09/2013 0529   ALBUMIN 1.9* 08/09/2013 0529   AST 52* 08/09/2013 0529   ALT 98* 08/09/2013 0529   ALKPHOS 226* 08/09/2013 0529   BILITOT 1.8* 08/09/2013 0529   GFRNONAA >90 08/12/2013 0602   GFRAA >90 08/12/2013 0602     Assessment and Plan:  1. Code Status: Partial as documented 2. Symptom Control:  Pain: Fentanyl 12.5 mcg every 2 hrs IV prn                   (discussed in detail with family pain management strategies)               Weakness/Failure to thrive  3. Psycho/Social: Emotional support offered to family. Many family members continue to express mis-trust and frustration with medical treatment. They express feelings that the medical team is "giving up". Education and support offered.  Concept of hospice and palliative care continues 4. Spiritual: Strong community church support 5. Disposition: Dependant on outcomes    Time In Time Out Total Time Spent with Patient Total Overall Time  1200 1300 60 min 60 min    Greater than 50%  of this time was spent counseling and coordinating care related to the above assessment and plan.  Lorinda Creed NP  Palliative Medicine Team Team Phone #  [phone] Pager [phone]  Discussed with Dr Catha Gosselin and Barnetta Chapel PA 1

## 2013-08-12 NOTE — Progress Notes (Signed)
[facility]                                                                                Patient Demographics  [name], is a 78 y.o. male, DOB - Feb 25, 1935, ZOX:[id number]  Admit date - 07/18/2013   Admitting Physician Therisa Doyne, MD  Outpatient Primary MD for the patient is [name], MD  LOS - 25   Chief Complaint  Patient presents with  . Emesis        Assessment & Plan  Principal Problem:   Intestinal ischemia and infarction Active Problems:   COPD   HTN (hypertension)   PVD (peripheral vascular disease) with claudication   Systolic CHF   Dehydration   Abdominal pain, epigastric   Gastric ulcer   Partial gastric outlet obstruction   Protein-calorie malnutrition, severe   Gastroparesis   Other specified gastritis without mention of hemorrhage   Acute respiratory failure   Hypovolemic shock   E. coli pneumonia   Palliative care encounter  Sepsis due to Intestinal ischemia and infarction s/p extensive SB resection with Shock liver -12/8 Exploratory laparotomy with entrectomy (patient required pressor and TNA which could not be tolerated) -No longer having nausea/vomiting -Continue Remeron and reglan -Shock liver, resolved, LFTs improved -NGTube in place -Feeds held as patient is having abdominal pain. -Surgery to reconsulted for staple removal as well as continued abdominal pain. -? New ischemia, patient currently has abdominal pain as well as leukocytosis -CT Abd/pelvis Distended colon suggesting ileus, no abscess, ?cholecysititis   Acute resp failure due to COPD Asp PNA ( E. Coli PNA) and Resp + metab alkalosis/Required intubation for Rt AKA 12/14.  - Hold lasix. Watch strict I and O's. - Off bipap. moniotr electrolytes replete as needed.  - completed course of antibiotics.   CAD/ ischemic cardiomyopathy -EF 35%  -Stable, Lasix being held  Vasculopathy -ischemic RLE > s/p R AKA 12/16  -Stable  NSTEMI  -Stable  A fib  -Continue  amiodarone  Thrombocytopenia with Coagulopathy -With low fibrinogen, low grade DIC picture likely related to ischemic leg >> improved after amputation.  -SQ heparin since 12/19 for DVT prevention  -Follow CBC   Nutrition -Currently has NG tube.   -Patient unable to tolerate feeds at this time. Will hold tube feeds.  Code Status: Partial  Family Communication: Wife at bedside.  Disposition Plan: Admitted.  Have asked surgery to see patient due to his abdominal pain as well as leukocytosis.  There is possibility of new ischemia. Pending further recommendations from surgery.  Spoke with wife regarding comfort measures, as patient wishes to go home.  Procedures/Course 12/8 intubated, shock, surgical evaluation, DNR established, Exploratory laparotomy with entrectomy (majority of jejunum and proximal ileum)  12/9 Segmental small bowel resection terminal ileum Two anastomoses to re-establish intestinal continuity  12/11 requires higher PEEP/FIO2  12/12 s/p R LE endarterectomy (Dr Early)  12/16 To OR for Rt AKA, returned intubated with 80% fio2  07/30/13 - EXTUBATED  12/18 intermittent NIMVS on 70% NRB  08/02/13: using bipap prn self directed for resp comfort. Good diuresis with lasix but RN says still sounds wet. Still hard to wean off NRB. Otherwise ok. Wants  to go home   Consults   Vascular  Surgery  PCCM  GI  DVT Prophylaxis   Heparin   Lab Results  Component Value Date   PLT 197 08/12/2013    Medications  Scheduled Meds: . amiodarone  200 mg Oral Daily  . arformoterol  15 mcg Nebulization BID  . ceFEPime (MAXIPIME) IV  2 g Intravenous Q12H  . heparin subcutaneous  5,000 Units Subcutaneous Q8H  . LORazepam  0.5 mg Intravenous Once  . metoCLOPramide  5 mg Oral TID AC & HS  . mirtazapine  7.5 mg Oral QHS  . pantoprazole sodium  40 mg Per Tube Q1200  . sertraline  100 mg Oral Daily   Continuous Infusions: . sodium chloride 50 mL/hr at 08/11/13 1645   PRN Meds:.alum &  mag hydroxide-simeth, fentaNYL, morphine CONCENTRATE, ondansetron  Antibiotics    Anti-infectives   Start     Dose/Rate Route Frequency Ordered Stop   08/11/13 1800  ceFEPIme (MAXIPIME) 2 g in dextrose 5 % 50 mL IVPB     2 g 100 mL/hr over 30 Minutes Intravenous Every 12 hours 08/11/13 1713     07/29/13 2200  vancomycin (VANCOCIN) IVPB 750 mg/150 ml premix  Status:  Discontinued     750 mg 150 mL/hr over 60 Minutes Intravenous Every 12 hours 07/29/13 1014 08/01/13 0941   07/25/13 1115  vancomycin (VANCOCIN) IVPB 1000 mg/200 mL premix     1,000 mg 200 mL/hr over 60 Minutes Intravenous To Surgery 07/25/13 1108 07/25/13 1111   07/22/13 2200  vancomycin (VANCOCIN) IVPB 1000 mg/200 mL premix  Status:  Discontinued     1,000 mg 200 mL/hr over 60 Minutes Intravenous Every 12 hours 07/22/13 2056 07/29/13 1014   07/21/13 1000  fluconazole (DIFLUCAN) IVPB 200 mg  Status:  Discontinued     200 mg 100 mL/hr over 60 Minutes Intravenous Every 24 hours 07/21/13 0944 08/04/13 0817   07/21/13 0945  metroNIDAZOLE (FLAGYL) IVPB 500 mg  Status:  Discontinued     500 mg 100 mL/hr over 60 Minutes Intravenous Every 8 hours 07/21/13 0933 07/25/13 0918   07/21/13 0945  piperacillin-tazobactam (ZOSYN) IVPB 3.375 g  Status:  Discontinued     3.375 g 12.5 mL/hr over 240 Minutes Intravenous 3 times per day 07/21/13 0944 08/04/13 0817   07/21/13 0800  vancomycin (VANCOCIN) IVPB 1000 mg/200 mL premix  Status:  Discontinued     1,000 mg 200 mL/hr over 60 Minutes Intravenous Every 12 hours 07/21/13 0626 07/22/13 1045   07/21/13 0615  ceFEPIme (MAXIPIME) 1 g in dextrose 5 % 50 mL IVPB  Status:  Discontinued     1 g 100 mL/hr over 30 Minutes Intravenous 3 times per day 07/21/13 0613 07/21/13 0919       Time Spent in minutes   25 minutes   Reynoldo Mainer D.O. on 08/12/2013 at 1:39 PM  Between 7am to 7pm - Pager - [phone]  After 7pm go to [email or web address] - password [email or web address]  And look for the night coverage  person covering for me after hours  [facility] Office  [phone]    Subjective:   [name] seen and examined today.  Complains of abdominal pain and wishes to go home.   Objective:   Filed Vitals:   08/11/13 2007 08/11/13 2104 08/12/13 0424 08/12/13 0953  BP:  104/34 95/42 104/43  Pulse:  96 96 88  Temp:  98.5 F (36.9 C) 98 F (36.7 C)   TempSrc:  Oral Axillary   Resp:  18 18   Height:      Weight:   58.1 kg (128 lb 1.4 oz)   SpO2: 93% 96% 96%     Wt Readings from Last 3 Encounters:  08/12/13 58.1 kg (128 lb 1.4 oz)  08/12/13 58.1 kg (128 lb 1.4 oz)  08/12/13 58.1 kg (128 lb 1.4 oz)     Intake/Output Summary (Last 24 hours) at 08/12/13 1339 Last data filed at 08/12/13 0810  Gross per 24 hour  Intake      0 ml  Output    300 ml  Net   -300 ml    Exam  General: Well developed, malnourished, mild distress, appears stated age  HEENT: NCAT, PERRLA, EOMI, Anicteic Sclera, mucous membranes dry  Neck: Supple, no JVD, no masses  Cardiovascular: S1 S2 auscultated, no rubs, murmurs or gallops. Regular rate and rhythm.  Respiratory: Clear to auscultation bilaterally with equal chest rise  Abdomen: Soft, tender to palpation particularly on the right side, nondistended, + bowel sounds, staples clean  Extremities: warm dry without cyanosis clubbing or edema in LLE, RLE AKA, stump clean  Neuro: AAOx3, cranial nerves grossly intact.  Skin: no rashes  Data Review   Micro Results Recent Results (from the past 240 hour(s))  CLOSTRIDIUM DIFFICILE BY PCR     Status: None   Collection Time    08/12/13 10:00 AM      Result Value Range Status   C difficile by pcr NEGATIVE  NEGATIVE Final    Radiology Reports Ct Abdomen Pelvis W Contrast  08/08/2013   CLINICAL DATA:  Nausea and vomiting.  EXAM: CT ABDOMEN AND PELVIS WITH CONTRAST  TECHNIQUE: Multidetector CT imaging of the abdomen and pelvis was performed using the standard protocol following bolus  administration of intravenous contrast.  CONTRAST:  OMNIPAQUE IOHEXOL 300 MG/ML SOLN. Hand injection was performed through central line without difficulty or complication.  COMPARISON:  CT abdomen/pelvis 07/21/2013  FINDINGS: Increased bilateral small to moderate pleural effusions are noted with associated compressive atelectasis. Portion of collapsed right lower lobe is hypodense centrally which could indicate necrosis or fluid/drowned lung. Heart size is mildly enlarged.  Wedge-shaped areas of peripheral splenic hypoenhancement are reidentified, unchanged since the prior exam, which again could represent infarcts or vascular perfusion phenomenon.  Layering sludge or stones noted within the gallbladder. Liver, right adrenal gland, right kidney, and pancreas appear normal. 2.5 cm mixed density left adrenal mass is stable in appearance. Dense atheromatous calcification of the non aneurysmal abdominal aorta and branch vessels again noted, with renal arterial stents in place. Minimal infrarenal abdominal aortic ectasia is stable.  The patient has has interval midline abdominal wall incision compatible with laparotomy. Trace pelvic fluid is identified. Colonic diverticuli noted without evidence for diverticulitis. Bladder wall thickness at upper limits of normal with internal air-fluid level suggesting recent catheterization. Since the prior exam, small bowel dilatation has resolved. No new area of bowel wall thickening or focal segmental dilatation is identified.  No new acute suboptimal suspect no new acute osseous abnormality. Lumbar spine degenerative change is present.  IMPRESSION: Interval resolution of previously seen small bowel dilatation after apparent midline laparotomy. Trace pelvic fluid is likely postoperative.  No new intra-abdominal or pelvic pathology.  Persistent apparent perfusion phenomenon in the spleen with wedge-shaped hypoperfused regions which could indicate infarct, unchanged.  Increased  bilateral lower lobe consolidation, right greater than left, with areas of hypodensity within the right lower lobe collapse segment  which could represent necrosis in the setting of pneumonia, drowned lung, or aspiration of lipoid material given the internal low-density.   Electronically Signed   By: Christiana Pellant M.D.   On: 08/08/2013 16:01   Ct Abdomen Pelvis W Contrast  07/18/2013   CLINICAL DATA:  Vomiting. Recently discharged after being admitted for peptic ulcers.  EXAM: CT ABDOMEN AND PELVIS WITH CONTRAST  TECHNIQUE: Multidetector CT imaging of the abdomen and pelvis was performed using the standard protocol following bolus administration of intravenous contrast.  CONTRAST:  OMNIPAQUE IOHEXOL 300 MG/ML  SOLN  COMPARISON:  06/17/2013.  FINDINGS: Multiple sigmoid colon diverticula are again demonstrated without evidence of diverticulitis. Multiple normal caliber fluid-filled loops of small bowel. Diffuse low density wall thickening in the duodenal bulb, measuring 1.2 cm in thickness on image number 26. This is producing moderate luminal narrowing. Tiny gallstones in the gallbladder. The largest measures 4 mm in maximum diameter. No gallbladder wall thickening or pericholecystic fluid.  Inhomogeneous enhancement of the liver and spleen with no discrete liver mass seen. 2.6 x 2.1 cm left adrenal mass with low density components. This was previously shown to measure 5 Hounsfield units without intravenous contrast, compatible with a benign adenoma. Small left renal cysts. Unremarkable right kidney, urinary bladder and prostate gland. No enlarged lymph nodes. No evidence of appendicitis. Dense atheromatous arterial calcifications. Bilateral renal artery stents. The no significant change in mild bibasilar atelectasis/scarring. Mild lumbar lower thoracic spine degenerative changes.  IMPRESSION: 1. Do tonight is involving the duodenal bulb with moderate luminal narrowing. 2. Normal caliber fluid-filled small  bowel loops. This can be seen with gastroenteritis. 3. Cholelithiasis without evidence of cholecystitis. 4. Extensive arterial calcifications with bilateral renal artery stents. 5. Sigmoid diverticulosis. 6. Stable left adrenal adenoma.   Electronically Signed   By: Gordan Payment M.D.   On: 07/18/2013 18:27   Nm Hepato W/eject Fract  07/11/2013   CLINICAL DATA:  Pain.  Nausea and vomiting.  EXAM: NUCLEAR MEDICINE HEPATOBILIARY IMAGING WITH GALLBLADDER EF  TECHNIQUE: Sequential images of the abdomen were obtained out to 60 minutes following intravenous administration of radiopharmaceutical. After slow intravenous infusion of 1.67 micrograms Cholecystokinin, gallbladder ejection fraction was determined.  COMPARISON:  Ultrasound 07/08/2013.  RADIOPHARMACEUTICALS:  98mCiTc-53m Choletec  FINDINGS: Liver, gallbladder, biliary system, and bowel appear normal. At 30 min, normal ejection fraction is greater than 30%.  The patient did not experience symptoms during CCK infusion.  IMPRESSION: Normal exam.   Electronically Signed   By: Maisie Fus  Register   On: 07/11/2013 12:50   Dg Chest Port 1 View  08/06/2013   CLINICAL DATA:  Pulmonary edema  EXAM: PORTABLE CHEST - 1 VIEW  COMPARISON:  08/02/2013.  FINDINGS: The cardiac silhouette is enlarged. And left internal jugular central venous catheter is appreciated with tip projecting in the region superior vena cava. Patient is status post median sternotomy and coronary artery bypass grafting. Atherosclerotic calcifications identified within the aorta. Diffuse bilateral pulmonary opacities are appreciated with greatest confluence of the right lung base. When compared to the previous study there has been decreased density and conspicuity of the findings in the right lung base. There is a consolidative component within the right lung base may represent a component of a small effusion. The osseous structures unremarkable.  IMPRESSION: Slightly more prominent interstitial infiltrate  again consistent with pulmonary edema. Focal infiltrate versus atelectasis right lung base is well small effusion. Continued surveillance evaluation recommended. Support line unchanged.   Electronically Signed  By: Salome Holmes M.D.   On: 08/06/2013 08:05   Dg Chest Port 1 View  08/02/2013   CLINICAL DATA:  Pneumonia.  CHF.  EXAM: PORTABLE CHEST - 1 VIEW  COMPARISON:  08/01/2013.  FINDINGS: Left IJ central line is present with the tip in the upper SVC. Position is unchanged compared to yesterday. Monitoring leads project over the chest. The appearance of the lungs is slightly worse with interstitial and airspace opacities and more focal consolidation in the right midlung which was present on prior exams consistent with scarring. Cardiopericardial silhouette remains enlarged.  IMPRESSION: 1. Stable support apparatus. 2. Slight interval increase in interstitial and airspace opacities likely representing pulmonary edema and CHF.   Electronically Signed   By: Andreas Newport M.D.   On: 08/02/2013 07:38   Dg Chest Port 1 View  08/01/2013   CLINICAL DATA:  Respiratory failure, right lower extremity amputation and bowel resection.  EXAM: PORTABLE CHEST - 1 VIEW  COMPARISON:  07/31/2013  FINDINGS: Overall worsening pattern of interstitial edema/ CHF. No pneumothorax is identified. Central line positioning is stable.  IMPRESSION: Worsening interstitial edema/ CHF.   Electronically Signed   By: Irish Lack M.D.   On: 08/01/2013 07:57   Dg Chest Portable 1 View  07/31/2013   CLINICAL DATA:  Respiratory failure.  EXAM: PORTABLE CHEST - 1 VIEW  COMPARISON:  One-view chest 07/30/2013.  FINDINGS: A left IJ line is stable in position. The patient has been extubated. The heart is enlarged. Atherosclerotic calcifications are again noted in the aortic arch. Bilateral pleural effusions and associated airspace disease is present. A diffuse interstitial pattern is unchanged. Airspace opacification at the level of the  minor fissure is slightly more prominent.  IMPRESSION: 1. Slight increase an airspace opacification over the right midlung, likely in the superior segment of the right lower lobe. 2. Overall stable interstitial pattern, likely representing edema superimposed on chronic coarsening. 3. Status post extubation. 4. Persistent bilateral pleural effusions as the associated airspace disease, likely atelectasis.   Electronically Signed   By: Gennette Pac M.D.   On: 07/31/2013 07:57   Dg Chest Port 1 View  07/30/2013   CLINICAL DATA:  Respiratory failure and status post bowel resection for necrosis and right leg amputation.  EXAM: PORTABLE CHEST - 1 VIEW  COMPARISON:  07/29/2013  FINDINGS: Endotracheal tube remains with the tip approximately 5 cm above the carina. Lungs show improved aeration on the left with less prominent atelectasis/ infiltrates present. Airspace disease of the right lung is relatively stable. No pneumothorax or significant pleural effusions are seen. The heart size and mediastinal contours are stable.  IMPRESSION: Improved aeration of the left lung. Stable airspace disease of the right lung.   Electronically Signed   By: Irish Lack M.D.   On: 07/30/2013 08:07   Dg Chest Port 1 View  07/29/2013   CLINICAL DATA:  Evaluate endotracheal tube placement.  EXAM: PORTABLE CHEST - 1 VIEW  COMPARISON:  07/29/2013  FINDINGS: Endotracheal tube is 5.1 cm above the carina. Nasogastric tube extends into the abdomen but the tip is beyond the image. Jugular central venous catheter tip in the upper SVC region. Heart size is grossly normal. The thoracic aorta is heavily calcified. No change in the bilateral patchy airspace disease. Probable pleural fluid at the lung bases.  IMPRESSION: The endotracheal tube is appropriately position.  Minimal change in the bilateral parenchymal lung disease. Probable bilateral pleural effusions.  Atherosclerotic disease.   Electronically Signed  By: Richarda Overlie M.D.   On:  07/29/2013 13:02   Dg Chest Portable 1 View  07/29/2013   CLINICAL DATA:  Respiratory distress.  EXAM: PORTABLE CHEST - 1 VIEW  COMPARISON:  07/27/2013  FINDINGS: There is hyperinflation of the lungs compatible with COPD. Prior CABG. Left central line and NG tube are unchanged. Mild cardiomegaly. Bilateral airspace opacities and interstitial disease again noted. Small bilateral effusions are stable. No significant change since prior study.  IMPRESSION: COPD.  Stable bilateral interstitial and alveolar opacities, likely edema.  Stable small bilateral effusions.   Electronically Signed   By: Charlett Nose M.D.   On: 07/29/2013 08:15   Dg Chest Port 1 View  07/28/2013   CLINICAL DATA:  Respiratory distress, shortness of breath.  EXAM: PORTABLE CHEST - 1 VIEW  COMPARISON:  Chest radiograph July 27, 2013 at 6:24 a.m.  FINDINGS: Cardiac silhouette is scratch the upper limits of normal, status post median sternotomy for apparent Coronary artery bypass grafting. Increasing central pulmonary vasculature congestion, worsening interstitial prominence with new right lower lobe airspace opacity. Small to moderate right pleural effusion extending into the fissure. Small left pleural effusion.  Interval apparent extubation. Left internal jugular central venous catheter with distal tip projecting in proximal superior vena cava. Nasogastric tube in place with side port projecting at gastroesophageal junction. Right upper extremity catheter may reflect a PICC, distal tip appears to terminate in the right axilla and was present previously. Multiple EKG lines overlie the patient and may obscure subtle underlying pathology.  IMPRESSION: Worsening interstitial and alveolar airspace opacities may reflect fluid overload, with increasing small to moderate right, small left pleural effusions.  Apparent interval extubation without change in remaining life support lines.   Electronically Signed   By: Awilda Metro   On:  07/28/2013 00:13   Dg Chest Port 1 View  07/27/2013   CLINICAL DATA:  Check endotracheal tube placement  EXAM: PORTABLE CHEST - 1 VIEW  COMPARISON:  07/26/2013  FINDINGS: Cardiac shadow is stable. A left central venous line is again noted in the mid superior vena cava. The endotracheal tube is noted 6.6 cm above the carinal. A nasogastric catheter is seen within the stomach. Stable changes are noted in the bases bilaterally. No new focal abnormality is seen.  IMPRESSION: No significant interval change from the prior exam.   Electronically Signed   By: Alcide Clever M.D.   On: 07/27/2013 08:09   Dg Chest Port 1 View  07/26/2013   CLINICAL DATA:  Followup pneumonia  EXAM: PORTABLE CHEST - 1 VIEW  COMPARISON:  07/25/2013  FINDINGS: A left-sided jugular central venous line is again noted in the proximal superior vena cava. The endotracheal tube and nasogastric catheter are stable in appearance and within normal position. The cardiac shadow is stable. Postsurgical changes are again noted. Increasing effusion is noted in the right base. The lungs are otherwise stable.  IMPRESSION: Increasing right basilar effusion.   Electronically Signed   By: Alcide Clever M.D.   On: 07/26/2013 07:21   Dg Chest Port 1 View  07/25/2013   CLINICAL DATA:  Respiratory failure and bowel resection.  EXAM: PORTABLE CHEST - 1 VIEW  COMPARISON:  07/24/2013  FINDINGS: Endotracheal tube tip approximately 4.5 cm above the carina. Central line positioning is stable. Lungs show improved aeration of both lower lobes. Underlying chronic lung disease is stable.  IMPRESSION: Improved aeration of both lower lobes.   Electronically Signed   By: Irish Lack  M.D.   On: 07/25/2013 07:00   Dg Chest Port 1 View  07/24/2013   CLINICAL DATA:  Endotracheal tube position. Decreased oxygen saturations.  EXAM: PORTABLE CHEST - 1 VIEW  COMPARISON:  07/23/2013  FINDINGS: Endotracheal tube is in place with tip likely just above the carina. Consider  withdrawing endotracheal tube approximately 2 cm. Nasogastric tube is in place with tip off the film but beyond the lower esophagus. Left IJ central line tip overlies the level of the superior vena cava. Bilateral lower lobe infiltrates/effusions again noted.  IMPRESSION: 1. Endotracheal tube tip just above the carina. 2. Bilateral lower lobe opacities, similar in appearance to prior study. Findings are consistent with infiltrates and effusions.   Electronically Signed   By: Rosalie Gums M.D.   On: 07/24/2013 01:57   Dg Chest Port 1 View  07/23/2013   CLINICAL DATA:  Ventilated patient.  History of abdominal surgery.  EXAM: PORTABLE CHEST - 1 VIEW  COMPARISON:  07/22/2013  FINDINGS: Endotracheal tube is 4.9 cm above the carina. Jugular central venous catheter in the upper SVC region. Nasogastric tube extends into the abdomen. Increased densities in the right lower chest could represent developing airspace disease or asymmetric edema. Slightly increased densities at the left lung base suggest atelectasis and suspect small pleural effusions. Heart size is stable. No evidence for a pneumothorax.  IMPRESSION: Increased densities in the right lower chest are concerning for developing airspace disease or asymmetric edema.  Probable small pleural effusions.   Electronically Signed   By: Richarda Overlie M.D.   On: 07/23/2013 08:08   Dg Chest Port 1 View  07/22/2013   CLINICAL DATA:  Status post laparotomy and small bowel resection for bowel infarction.  EXAM: PORTABLE CHEST - 1 VIEW  COMPARISON:  07/21/2013.  FINDINGS: Endotracheal tube present with the tip approximately 5 cm above the carina. Central line tip lies in the upper SVC. Lungs show stable chronic disease without evidence of overt edema or airspace consolidation. The heart size is stable and within normal limits.  IMPRESSION: Stable chronic lung disease.   Electronically Signed   By: Irish Lack M.D.   On: 07/22/2013 10:41   Dg Chest Port 1  View  07/21/2013   CLINICAL DATA:  Endotracheal tube and line placement  EXAM: PORTABLE CHEST - 1 VIEW  COMPARISON:  07/18/2013  FINDINGS: Endotracheal tube tip lies 1.5 cm above the carina. A left internal jugular central venous line tip lies in the mid superior vena cava. A nasogastric tube has also been placed. Its tip lies at the GE junction. It will need to be further inserted to allow the tip to fully into the stomach.  No pneumothorax evident on this semi-erect study.  Lung hyperexpansion, chronic interstitial thickening and areas of lung scarring and probable basilar subsegmental atelectasis are stable. No area of focal consolidation and no overt pulmonary edema.  IMPRESSION: 1. Endotracheal tube and left internal jugular central venous line are well positioned. No pneumothorax. 2. Nasogastric tube tip lies at the GE junction. Recommend further inserting tube 10-15 cm to allow the tip to fully into the stomach. 3. No change in the appearance of the lungs when allowing for differences in technique and patient positioning.   Electronically Signed   By: Amie Portland M.D.   On: 07/21/2013 10:39   Dg Chest Port 1 View  07/18/2013   CLINICAL DATA:  Vomiting.  Smoker.  EXAM: PORTABLE CHEST - 1 VIEW  COMPARISON:  07/06/2013.  FINDINGS: Normal sized heart. Post CABG changes. Decreased inspiration with minimal bibasilar atelectasis. Atheromatous arterial calcifications. Stable right basilar scarring. Stable prominence of the interstitial markings.  IMPRESSION: 1. Poor inspiration with minimal bibasilar atelectasis. 2. Stable changes of COPD with right basilar scarring.   Electronically Signed   By: Gordan Payment M.D.   On: 07/18/2013 14:28   Dg Abd Portable 1v  07/28/2013   CLINICAL DATA:  Nasogastric tube placement.  EXAM: PORTABLE ABDOMEN - 1 VIEW  COMPARISON:  07/21/2013 CT.  FINDINGS: Nasogastric tube tip distal esophagus level. This needs to be advanced.  Gas-filled slightly dilated small bowel loops. This  represents a significant improvement from the prior CT. Patient has had interval surgery.  Basilar atelectasis.  Calcified aorta branch vessels.  Common iliac stents in place.  Residual barium in the rectum.  The possibility of free intraperitoneal air cannot be assessed on a supine view.  IMPRESSION: Nasogastric tube tip distal esophagus level. This needs to be advanced.  Please see above.  This is a call report.   Electronically Signed   By: Bridgett Larsson M.D.   On: 07/28/2013 14:25   Ct Angio Abd/pel W/ And/or W/o  07/21/2013   CLINICAL DATA:  Epigastric abdominal pain, nausea, vomiting and weight loss. Endoscopy on 12/07 demonstrates severe erosive ulcerative gastritis as well as duodenitis and jejunitis.  EXAM: CT ANGIOGRAPHY ABDOMEN AND PELVIS WITH CONTRAST AND WITHOUT CONTRAST  TECHNIQUE: Multidetector CT imaging of the abdomen and pelvis was performed using the standard protocol during bolus administration of intravenous contrast. Multiplanar reconstructed images including MIPs were obtained and reviewed to evaluate the vascular anatomy.  CONTRAST:  OMNIPAQUE IOHEXOL 350 MG/ML SOLN  COMPARISON:  Standard CT of the abdomen and pelvis with contrast on 07/18/2013.  FINDINGS: The aorta and major visceral branches are heavily calcified with diffuse atherosclerosis present. The origin of the celiac axis shows critical narrowing and likely near subtotal occlusive disease. Distal branches are opacified and the celiac trunk is likely not completely occluded.  The proximal superior mesenteric artery shows heavily calcified plaque extending for a long distance down the trunk of the SMA. The proximal SMA trunk is completely occluded with reconstitution more distally by jejunal branches related to collateral reconstitution from celiac and IMA supplied. The inferior mesenteric artery origin is heavily calcified but open.  Bilateral renal artery stents are identified which are open. Bilateral common iliac artery  stents are also present which are open. The external iliac arteries are diffusely diseased with heavily calcified plaque.  There is likely critical stenosis and near subtotal occlusion at the level of the mid right external iliac artery. Moderate narrowing of the distal external iliac artery is also present just above the inguinal ligament approaching 70-75% narrowing. At the level of the right groin, postsurgical changes are seen likely related to prior femoral bypass with an occluded proximal bypass graft present. The native SFA is also occluded at its origin.  Diffuse disease of the left external iliac artery present without significant stenosis. The common femoral artery shows irregular plaque causing approximately 50% narrowing just below the inguinal ligament. Postsurgical changes are seen in the left groin with an open proximal bypass graft identified in the anterior thigh. The native SFA is occluded at its origin.  Nonvascular evaluation shows significant interval distension of small bowel loops which are diffusely dilated and fluid-filled. There may be some pneumatosis in the transverse duodenum and proximal jejunum as well, concerning for potential evolution of bowel ischemia  to necrosis. The colon is completely decompressed. There is no evidence of free intraperitoneal air or focal abscess. The gallbladder shows some increased distention since the prior study. There is no evidence of overt biliary obstruction, however. Stable left adrenal mass identified. This is fairly low density and likely an incidental adenoma.  Review of the MIP images confirms the above findings.  IMPRESSION: 1. 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 2. 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 3. Heavily calcified but open inferior mesenteric artery. 4.  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 Postsurgical changes are evident in the right groin with occlusion of a previously placed proximal bypass graft. 5. Open left femoral bypass graft. 6. Significant interval distension of small bowel loops which are diffusely dilated and filled with fluid. There also may be a pneumatosis in the duodenum and proximal jejunum concerning for severe ischemia/necrosis. Surgical consultation would be recommended. Critical Value/emergent results were called by telephone at the time of interpretation on 07/21/2013 at 8:00 AM to Dr.Prashant Thedore Mins, who verbally acknowledged these results.   Electronically Signed   By: Irish Lack M.D.   On: 07/21/2013 08:35    CBC  Recent Labs Lab 08/08/13 1704 08/09/13 0529 08/11/13 0852 08/12/13 0602  WBC 7.6 7.6 16.5* 17.8*  HGB 9.8* 9.7* 9.4* 8.8*  HCT 29.8* 30.2* 28.5* 27.0*  PLT 232 226 212 197  MCV 92.0 93.5 95.0 95.1  MCH 30.2 30.0 31.3 31.0  MCHC 32.9 32.1 33.0 32.6  RDW 19.3* 19.8* 20.6* 21.1*  LYMPHSABS  --  1.4 0.9  --   MONOABS  --  0.5 1.0  --   EOSABS  --  0.1 0.0  --   BASOSABS  --  0.0 0.0  --     Chemistries   Recent Labs Lab 08/06/13 0430  08/07/13 0430 08/08/13 0620 08/09/13 0529 08/10/13 0830 08/12/13 0602  NA 139  < > 137 139 137 139 144  K 2.8*  < > 3.8 3.0* 3.4* 4.2 3.3*  CL 94*  < > 95* 95* 98 101 105  CO2 38*  < > 35* 36* 34* 30 27  GLUCOSE 131*  < > 87 81 67* 67* 70  BUN 45*  < > 43* 39* 30* 29* 36*  CREATININE 0.74  < > 0.81 0.87 0.77 0.76 0.65  CALCIUM 7.7*  < > 8.0* 8.0* 7.8* 7.7* 7.8*  MG 1.9  --  1.9  --   --   --   --   AST  --   --   --   --  52*  --   --   ALT  --   --   --   --  98*  --   --   ALKPHOS  --   --   --   --  226*  --   --   BILITOT  --   --   --   --  1.8*  --   --   < > = values in this interval not displayed. ------------------------------------------------------------------------------------------------------------------ estimated  creatinine clearance is 64.6 ml/min (by C-G formula based on Cr of 0.65). ------------------------------------------------------------------------------------------------------------------ No results found for this basename: HGBA1C,  in the last 72 hours ------------------------------------------------------------------------------------------------------------------  Recent Labs  08/11/13 0852  TRIG 100   ------------------------------------------------------------------------------------------------------------------ No results found for this basename: TSH, T4TOTAL,  FREET3, T3FREE, THYROIDAB,  in the last 72 hours ------------------------------------------------------------------------------------------------------------------ No results found for this basename: VITAMINB12, FOLATE, FERRITIN, TIBC, IRON, RETICCTPCT,  in the last 72 hours  Coagulation profile No results found for this basename: INR, PROTIME,  in the last 168 hours  No results found for this basename: DDIMER,  in the last 72 hours  Cardiac Enzymes No results found for this basename: CK, CKMB, TROPONINI, MYOGLOBIN,  in the last 168 hours ------------------------------------------------------------------------------------------------------------------ No components found with this basename: POCBNP,

## 2013-08-12 NOTE — Progress Notes (Signed)
Report given to receiving RN. Patient is stable with no verbal complaints and no signs or symptoms of distress or discomfort.  

## 2013-08-12 NOTE — Progress Notes (Signed)
Physical Therapy Treatment Patient Details Name: CHRISTIN MCCREEDY MRN: [id number] DOB: [birth year] Today's Date: 08/12/2013 Time: 4098-1191 PT Time Calculation (min): 28 min  PT Assessment / Plan / Recommendation  History of Present Illness 78 yo with ischemic cardiomyopathy, severe PAD admitted with abdominal pain.  CT abdomen demonstrated extensive ischemic changes. Was urgently intubated for shock and respiratory failure and transferred to ICU. Extubated then back to OR for R AKA 12/16 and reintubated. Currently on NRB and was on Bipap earlier today.   PT Comments   Pt with somewhat improved activity tolerance today during session with decreased pain noted.  He was able to perform rolling at min/guard assist to the L, however other bed mobility at max to +2 assist for safety.  Pt tolerated approx 5-6 mins sitting at EOB before becoming very fatigued.  Encouraged pt to let therapist assist into chair with maximove, however pt declined, but did agree to having bed in chair position.  Nurse tech notified of OOB to chair recommendations, however feel time should be limited due to decreased skin integrity and may need cushion in recliner prior to transfer.   Follow Up Recommendations  SNF;Supervision/Assistance - 24 hour     Does the patient have the potential to tolerate intense rehabilitation     Barriers to Discharge        Equipment Recommendations  None recommended by PT    Recommendations for Other Services    Frequency Min 3X/week   Progress towards PT Goals Progress towards PT goals: Progressing toward goals  Plan Discharge plan needs to be updated    Precautions / Restrictions Precautions Precautions: Fall Precaution Comments: partial code, watch resp carefully   Pertinent Vitals/Pain Pt with no c/o pain during session.     Mobility  Bed Mobility Bed Mobility: Rolling Left;Left Sidelying to Sit;Sit to Supine;Scooting to [facility] Rolling Left: 4: Min guard;With rail Left Sidelying  to Sit: 2: Max assist Sitting - Scoot to [place] of Bed: 2: Max assist Sit to Supine: 1: +2 Total assist;HOB flat Sit to Supine: Patient Percentage: 20% Scooting to HOB: 1: +2 Total assist Scooting to [facility]: Patient Percentage: 0% Details for Bed Mobility Assistance: Pt tolerated rolling and sitting up at EOB very well today.  He was able to roll with min/guard for safety and cues for technique.  Requires max assist to assist LLE out of bed and also elevate trunk into sitting.  Pt was able to self assist with UEs somewhat during session.  Also noted no increase in pain with mobility today.  Transfers Transfers: Not assessed Sit to Stand: Not tested (comment) Details for Transfer Assistance: Offered to assist pt into chair via maximove, however pt declined due to fatigue today.     Exercises General Exercises - Lower Extremity Ankle Circles/Pumps: AROM;Left;20 reps;Supine Gluteal Sets: Other (comment);Strengthening;Right;10 reps (R hip ext into pillow) Hip ABduction/ADduction: AAROM;Strengthening;Both;10 reps;Supine Straight Leg Raises: AAROM;Both;10 reps;Supine (AA with LLE)   PT Diagnosis:    PT Problem List:   PT Treatment Interventions:     PT Goals (current goals can now be found in the care plan section) Acute Rehab PT Goals PT Goal Formulation: With patient Time For Goal Achievement: 08/16/13 Potential to Achieve Goals: Fair  Visit Information  Last PT Received On: 08/12/13 Assistance Needed: +2 (if going to attempt any standing/scooting) History of Present Illness: 78 yo with ischemic cardiomyopathy, severe PAD admitted with abdominal pain.  CT abdomen demonstrated extensive ischemic changes. Was urgently intubated for shock  and respiratory failure and transferred to ICU. Extubated then back to OR for R AKA 12/16 and reintubated. Currently on NRB and was on Bipap earlier today.    Subjective Data      Cognition  Cognition Arousal/Alertness: Awake/alert Behavior During  Therapy: Flat affect Overall Cognitive Status: Within Functional Limits for tasks assessed    Balance  Balance Balance Assessed: Yes Static Sitting Balance Static Sitting - Balance Support: Bilateral upper extremity supported;Feet supported Static Sitting - Level of Assistance: 5: Stand by assistance Static Sitting - Comment/# of Minutes: Pt able to tolerate approx 5-6 mins sitting at EOB.  Provided max cues and facilitation for upright posture by giving cues for scapular retraction and upright head.  Pt fatigued very quickly.  Attempted scooting at EOB, however pt unable to fully WB through LLE.   End of Session PT - End of Session Equipment Utilized During Treatment: Oxygen Activity Tolerance: Patient limited by fatigue Patient left: in bed;with call bell/phone within reach;with family/visitor present Nurse Communication: Mobility status   GP     Vista Deck 08/12/2013, 2:57 PM

## 2013-08-12 NOTE — Progress Notes (Signed)
ANTIBIOTIC CONSULT NOTE - INITIAL  Pharmacy Consult for cefepime Indication: ischemic bowel  No Known Allergies  Patient Measurements: Height: 6' (182.9 cm) Weight: 128 lb 1.4 oz (58.1 kg) (air bed) IBW/kg (Calculated) : 77.6   Vital Signs: Temp: 98 F (36.7 C) (12/30 0424) Temp src: Axillary (12/30 0424) BP: 104/43 mmHg (12/30 0953) Pulse Rate: 88 (12/30 0953) Intake/Output from previous day: 12/29 0701 - 12/30 0700 In: 0  Out: 300 [Emesis/NG output:300] Intake/Output from this shift:    Labs:  Recent Labs  08/10/13 0830 08/11/13 0852 08/12/13 0602  WBC  --  16.5* 17.8*  HGB  --  9.4* 8.8*  PLT  --  212 197  CREATININE 0.76  --  0.65   Estimated Creatinine Clearance: 64.6 ml/min (by C-G formula based on Cr of 0.65).   Microbiology: Recent Results (from the past 720 hour(s))  CULTURE, BLOOD (ROUTINE X 2)     Status: None   Collection Time    07/21/13  7:45 AM      Result Value Range Status   Specimen Description BLOOD LEFT ARM   Final   Special Requests BOTTLES DRAWN AEROBIC ONLY 10CC   Final   Culture  Setup Time     Final   Value: 07/21/2013 13:39     Performed at [facility]   Culture     Final   Value: NO GROWTH 5 DAYS     Performed at [facility]   Report Status 07/27/2013 FINAL   Final  CULTURE, BLOOD (ROUTINE X 2)     Status: None   Collection Time    07/21/13  7:50 AM      Result Value Range Status   Specimen Description BLOOD LEFT ARM   Final   Special Requests BOTTLES DRAWN AEROBIC ONLY 6CC   Final   Culture  Setup Time     Final   Value: 07/21/2013 13:39     Performed at [facility]   Culture     Final   Value: NO GROWTH 5 DAYS     Performed at [facility]   Report Status 07/27/2013 FINAL   Final  CULTURE, RESPIRATORY (NON-EXPECTORATED)     Status: None   Collection Time    07/21/13  3:10 PM      Result Value Range Status   Specimen Description ENDOTRACHEAL   Final   Special Requests NONE   Final    Gram Stain     Final   Value: FEW WBC PRESENT,BOTH PMN AND MONONUCLEAR     NO SQUAMOUS EPITHELIAL CELLS SEEN     FEW GRAM POSITIVE RODS     RARE GRAM NEGATIVE RODS     RARE GRAM POSITIVE COCCI   Culture     Final   Value: MODERATE ESCHERICHIA COLI     Performed at [facility]   Report Status 07/24/2013 FINAL   Final   Organism ID, Bacteria ESCHERICHIA COLI   Final  CLOSTRIDIUM DIFFICILE BY PCR     Status: None   Collection Time    08/12/13 10:00 AM      Result Value Range Status   C difficile by pcr NEGATIVE  NEGATIVE Final   Assessment: 25 YOM admitted 12/5 and has extensive hospital course. Last evening he was restarted on antibiotics for concern of return of ischemic bowel. Antibiotics and cultures as below:  Vanc 12/8>>12/19 Zosyn 12/8>>12/22 Fluconazole 12/8>>12/22 Metronidazole 12/6>> 12/12 Cefepime 12/6>>12/8; 12/29 >>  12/9 VT = 18 12/16 VT= 22.4  12/30 CDif: neg 12/8 + strep pneumo urinary antigen 12/8 BCx: Neg 12/8 Resp: Ecoli (Sens to CTX and Zosyn)   SCr 0.65 with est CrCl ~44mL/min. WBC elevated at 17.8, afebrile.  Goal of Therapy:  Eradication of infection  Plan:  1. Cefepime 2g IV q12h 2. Follow up LOT, c/s, clinical progression, palliative discussions/GOC  [place] Kesinger D. Elester [name], PharmD, BCPS Clinical Pharmacist Pager: [phone] 08/12/2013 1:08 PM

## 2013-08-13 DIAGNOSIS — I2510 Atherosclerotic heart disease of native coronary artery without angina pectoris: Secondary | ICD-10-CM

## 2013-08-13 DIAGNOSIS — R578 Other shock: Secondary | ICD-10-CM

## 2013-08-13 DIAGNOSIS — E7800 Pure hypercholesterolemia, unspecified: Secondary | ICD-10-CM

## 2013-08-13 LAB — URINALYSIS, ROUTINE W REFLEX MICROSCOPIC
Glucose, UA: NEGATIVE mg/dL
Hgb urine dipstick: NEGATIVE
Ketones, ur: 15 mg/dL — AB
Nitrite: NEGATIVE
Protein, ur: NEGATIVE mg/dL
Urobilinogen, UA: 1 mg/dL (ref 0.0–1.0)

## 2013-08-13 LAB — COMPREHENSIVE METABOLIC PANEL
ALT: 40 U/L (ref 0–53)
AST: 38 U/L — ABNORMAL HIGH (ref 0–37)
Albumin: 1.7 g/dL — ABNORMAL LOW (ref 3.5–5.2)
Alkaline Phosphatase: 156 U/L — ABNORMAL HIGH (ref 39–117)
BUN: 35 mg/dL — ABNORMAL HIGH (ref 6–23)
Creatinine, Ser: 0.62 mg/dL (ref 0.50–1.35)
Potassium: 3.3 mEq/L — ABNORMAL LOW (ref 3.7–5.3)
Total Protein: 4.9 g/dL — ABNORMAL LOW (ref 6.0–8.3)

## 2013-08-13 LAB — CBC
Hemoglobin: 9 g/dL — ABNORMAL LOW (ref 13.0–17.0)
MCV: 94.8 fL (ref 78.0–100.0)
RBC: 2.9 MIL/uL — ABNORMAL LOW (ref 4.22–5.81)
RDW: 20.8 % — ABNORMAL HIGH (ref 11.5–15.5)
WBC: 20.5 10*3/uL — ABNORMAL HIGH (ref 4.0–10.5)

## 2013-08-13 LAB — GLUCOSE, CAPILLARY
Glucose-Capillary: 96 mg/dL (ref 70–99)
Glucose-Capillary: 82 mg/dL (ref 70–99)
Glucose-Capillary: 88 mg/dL (ref 70–99)

## 2013-08-13 MED ORDER — ARFORMOTEROL TARTRATE 15 MCG/2ML IN NEBU: 15.0000 ug | INHALATION_SOLUTION | Freq: Two times a day (BID) | RESPIRATORY_TRACT | Status: AC

## 2013-08-13 MED ORDER — MORPHINE SULFATE (CONCENTRATE) 10 MG /0.5 ML PO SOLN: 10.0000 mg | ORAL | Status: AC | PRN

## 2013-08-13 MED ORDER — ALUM & MAG HYDROXIDE-SIMETH 200-200-20 MG/5ML PO SUSP: 15.0000 mL | Freq: Four times a day (QID) | ORAL | Status: AC | PRN

## 2013-08-13 MED ORDER — MIRTAZAPINE 7.5 MG PO TABS: 7.5000 mg | ORAL_TABLET | Freq: Every day | ORAL | Status: AC

## 2013-08-13 MED ORDER — ONDANSETRON HCL 4 MG/2ML IJ SOLN: 4.0000 mg | Freq: Four times a day (QID) | INTRAMUSCULAR | Status: AC | PRN

## 2013-08-13 MED ORDER — METOCLOPRAMIDE HCL 5 MG PO TABS: 5.0000 mg | ORAL_TABLET | Freq: Three times a day (TID) | ORAL | Status: AC

## 2013-08-13 MED ORDER — DEXTROSE 5 % IV SOLN: 2.0000 g | Freq: Two times a day (BID) | INTRAVENOUS | Status: AC

## 2013-08-13 MED ORDER — HEPARIN SODIUM (PORCINE) 5000 UNIT/ML IJ SOLN: 5000.0000 [IU] | Freq: Three times a day (TID) | INTRAMUSCULAR | Status: AC

## 2013-08-13 MED ORDER — LORAZEPAM 2 MG/ML IJ SOLN: 0.5000 mg | Freq: Once | INTRAMUSCULAR | Status: AC

## 2013-08-13 MED ORDER — SERTRALINE HCL 100 MG PO TABS: 100.0000 mg | ORAL_TABLET | Freq: Every day | ORAL | Status: AC

## 2013-08-13 MED ORDER — AMIODARONE HCL 200 MG PO TABS: 200.0000 mg | ORAL_TABLET | Freq: Every day | ORAL | Status: AC

## 2013-08-13 MED ORDER — PANTOPRAZOLE SODIUM 40 MG PO PACK: 40.0000 mg | PACK | Freq: Every day | ORAL | Status: AC

## 2013-08-13 MED ORDER — FENTANYL CITRATE 0.05 MG/ML IJ SOLN: 12.5000 ug | INTRAMUSCULAR | Status: AC | PRN

## 2013-08-13 NOTE — Progress Notes (Signed)
I have seen and examined the pt and agree with PA-Osborne's progress note. US revealed no acute cholecystitis. No surgical procedures needed at this time. Pain control at this time.

## 2013-08-13 NOTE — Progress Notes (Signed)
Pt discharged from hospital via EMS. Pt stable. Discharge instructions were given to family earlier this PM by day shift RN. Cardiac monitor and IV were previously removed by day shift RN per order. Night shift RN verified that there are no questions from family regarding discharge. NG tube remained clamped. EMS taking [facility] to [facility] to resume care.

## 2013-08-13 NOTE — Progress Notes (Signed)
[facility]                                                                                Patient Demographics  [name], is a 78 y.o. male, DOB - [birth year], GNF:[id number]  Admit date - 07/18/2013   Admitting Physician Therisa Doyne, MD  Outpatient Primary MD for the patient is [name], MD  LOS - 26   Chief Complaint  Patient presents with  . Emesis        Assessment & Plan  Principal Problem:   Intestinal ischemia and infarction Active Problems:   COPD   HTN (hypertension)   PVD (peripheral vascular disease) with claudication   Systolic CHF   Dehydration   Abdominal pain, epigastric   Gastric ulcer   Partial gastric outlet obstruction   Protein-calorie malnutrition, severe   Gastroparesis   Other specified gastritis without mention of hemorrhage   Acute respiratory failure   Hypovolemic shock   E. coli pneumonia   Palliative care encounter  Sepsis due to Intestinal ischemia and infarction s/p extensive SB resection with Shock liver -12/8 Exploratory laparotomy with entrectomy (patient required pressor and TNA which could not be tolerated) -No longer having nausea/vomiting -Continue Remeron and reglan -Shock liver, resolved, LFTs improved -NGTube in place -Feeds held as patient is having abdominal pain. -Surgery to reconsulted for staple removal as well as continued abdominal pain. -? New ischemia, patient currently has abdominal pain as well as leukocytosis -CT Abd/pelvis Distended colon suggesting ileus, no abscess, ?cholecysititis -US Abdomen: Distended gallbladder containing sludge and gallstones without sonographic evidence of cholecystitis.  -No indication for intervention at this time. -Will check UA  Acute resp failure due to COPD Asp PNA ( E. Coli PNA) and Resp + metab alkalosis/Required intubation for Rt AKA 12/14.  - Hold lasix. Watch strict I and O's. - Off bipap. moniotr electrolytes replete as needed.  - completed course of  antibiotics.   CAD/ ischemic cardiomyopathy -EF 35%  -Stable, Lasix being held  Vasculopathy -ischemic RLE > s/p R AKA 12/16  -Stable  NSTEMI  -Stable  A fib  -Continue amiodarone  Thrombocytopenia with Coagulopathy -With low fibrinogen, low grade DIC picture likely related to ischemic leg >> improved after amputation.  -SQ heparin since 12/19 for DVT prevention  -Follow CBC   Nutrition -Currently has NG tube.   -Patient unable to tolerate feeds at this time. Will hold tube feeds.  Code Status: Partial  Family Communication: Wife at bedside. Spoke with daughter via telephone for approximately 30 minutes.    Disposition Plan: Admitted.  Her daughter and family request, [facility] was contacted for possible transfer. Pending bed availability.  Procedures/Course 12/8 intubated, shock, surgical evaluation, DNR established, Exploratory laparotomy with entrectomy (majority of jejunum and proximal ileum)  12/9 Segmental small bowel resection terminal ileum Two anastomoses to re-establish intestinal continuity  12/11 requires higher PEEP/FIO2  12/12 s/p R LE endarterectomy (Dr Early)  12/16 To OR for Rt AKA, returned intubated with 80% fio2  07/30/13 - EXTUBATED  12/18 intermittent NIMVS on 70% NRB  08/02/13: using bipap prn self directed for resp comfort. Good diuresis with lasix but RN says  still sounds wet. Still hard to wean off NRB. Otherwise ok. Wants to go home  12/30: Abd [place]: Distended gallbladder containing sludge and gallstones without sonographic evidence of cholecystitis.  Consults   Vascular  Surgery  PCCM  GI Paliative  DVT Prophylaxis   Heparin   Lab Results  Component Value Date   PLT 205 08/13/2013    Medications  Scheduled Meds: . amiodarone  200 mg Oral Daily  . arformoterol  15 mcg Nebulization BID  . ceFEPime (MAXIPIME) IV  2 g Intravenous Q12H  . heparin subcutaneous  5,000 Units Subcutaneous Q8H  . LORazepam  0.5 mg  Intravenous Once  . metoCLOPramide  5 mg Oral TID AC & HS  . mirtazapine  7.5 mg Oral QHS  . pantoprazole sodium  40 mg Per Tube Q1200  . sertraline  100 mg Oral Daily   Continuous Infusions: . sodium chloride 50 mL/hr at 08/12/13 1731   PRN Meds:.alum & mag hydroxide-simeth, fentaNYL, morphine CONCENTRATE, ondansetron  Antibiotics    Anti-infectives   Start     Dose/Rate Route Frequency Ordered Stop   08/11/13 1800  ceFEPIme (MAXIPIME) 2 g in dextrose 5 % 50 mL IVPB     2 g 100 mL/hr over 30 Minutes Intravenous Every 12 hours 08/11/13 1713     07/29/13 2200  vancomycin (VANCOCIN) IVPB 750 mg/150 ml premix  Status:  Discontinued     750 mg 150 mL/hr over 60 Minutes Intravenous Every 12 hours 07/29/13 1014 08/01/13 0941   07/25/13 1115  vancomycin (VANCOCIN) IVPB 1000 mg/200 mL premix     1,000 mg 200 mL/hr over 60 Minutes Intravenous To Surgery 07/25/13 1108 07/25/13 1111   07/22/13 2200  vancomycin (VANCOCIN) IVPB 1000 mg/200 mL premix  Status:  Discontinued     1,000 mg 200 mL/hr over 60 Minutes Intravenous Every 12 hours 07/22/13 2056 07/29/13 1014   07/21/13 1000  fluconazole (DIFLUCAN) IVPB 200 mg  Status:  Discontinued     200 mg 100 mL/hr over 60 Minutes Intravenous Every 24 hours 07/21/13 0944 08/04/13 0817   07/21/13 0945  metroNIDAZOLE (FLAGYL) IVPB 500 mg  Status:  Discontinued     500 mg 100 mL/hr over 60 Minutes Intravenous Every 8 hours 07/21/13 0933 07/25/13 0918   07/21/13 0945  piperacillin-tazobactam (ZOSYN) IVPB 3.375 g  Status:  Discontinued     3.375 g 12.5 mL/hr over 240 Minutes Intravenous 3 times per day 07/21/13 0944 08/04/13 0817   07/21/13 0800  vancomycin (VANCOCIN) IVPB 1000 mg/200 mL premix  Status:  Discontinued     1,000 mg 200 mL/hr over 60 Minutes Intravenous Every 12 hours 07/21/13 0626 07/22/13 1045   07/21/13 0615  ceFEPIme (MAXIPIME) 1 g in dextrose 5 % 50 mL IVPB  Status:  Discontinued     1 g 100 mL/hr over 30 Minutes Intravenous 3  times per day 07/21/13 0613 07/21/13 0919       Time Spent in minutes   75 minutes   Terrian Sentell D.O. on 08/13/2013 at 12:12 PM  Between 7am to 7pm - Pager - [phone]  After 7pm go to [email or web address] - password [email or web address]  And look for the night coverage person covering for me after hours  [facility] Office  [phone]    Subjective:   [name] seen and examined today.  Complains of abdominal pain and wishes to go home.   Objective:   Filed Vitals:   08/12/13 2039 08/13/13 0616 08/13/13 0840 08/13/13  0940  BP:  84/43  96/51  Pulse: 98 104  99  Temp:  98.3 F (36.8 C)    TempSrc:  Oral    Resp: 18 18    Height:      Weight:      SpO2: 94% 90% 91%     Wt Readings from Last 3 Encounters:  08/12/13 58.1 kg (128 lb 1.4 oz)  08/12/13 58.1 kg (128 lb 1.4 oz)  08/12/13 58.1 kg (128 lb 1.4 oz)     Intake/Output Summary (Last 24 hours) at 08/13/13 1212 Last data filed at 08/13/13 1027  Gross per 24 hour  Intake 1412.5 ml  Output    325 ml  Net 1087.5 ml    Exam  General: Well developed, malnourished, mild distress, appears stated age  HEENT: NCAT, PERRLA, EOMI, Anicteic Sclera, mucous membranes dry  Neck: Supple, no JVD, no masses  Cardiovascular: S1 S2 auscultated, no rubs, murmurs or gallops. Regular rate and rhythm.  Respiratory: Clear to auscultation bilaterally with equal chest rise  Abdomen: Soft, tender to palpation particularly on the right side, nondistended, + bowel sounds, staples clean  Extremities: warm dry without cyanosis clubbing or edema in LLE, RLE AKA, stump clean  Neuro: AAOx3, cranial nerves grossly intact.  Skin: no rashes  Data Review   Micro Results Recent Results (from the past 240 hour(s))  CLOSTRIDIUM DIFFICILE BY PCR     Status: None   Collection Time    08/12/13 10:00 AM      Result Value Range Status   C difficile by pcr NEGATIVE  NEGATIVE Final    Radiology Reports Ct Abdomen Pelvis W  Contrast  08/08/2013   CLINICAL DATA:  Nausea and vomiting.  EXAM: CT ABDOMEN AND PELVIS WITH CONTRAST  TECHNIQUE: Multidetector CT imaging of the abdomen and pelvis was performed using the standard protocol following bolus administration of intravenous contrast.  CONTRAST:  OMNIPAQUE IOHEXOL 300 MG/ML SOLN. Hand injection was performed through central line without difficulty or complication.  COMPARISON:  CT abdomen/pelvis 07/21/2013  FINDINGS: Increased bilateral small to moderate pleural effusions are noted with associated compressive atelectasis. Portion of collapsed right lower lobe is hypodense centrally which could indicate necrosis or fluid/drowned lung. Heart size is mildly enlarged.  Wedge-shaped areas of peripheral splenic hypoenhancement are reidentified, unchanged since the prior exam, which again could represent infarcts or vascular perfusion phenomenon.  Layering sludge or stones noted within the gallbladder. Liver, right adrenal gland, right kidney, and pancreas appear normal. 2.5 cm mixed density left adrenal mass is stable in appearance. Dense atheromatous calcification of the non aneurysmal abdominal aorta and branch vessels again noted, with renal arterial stents in place. Minimal infrarenal abdominal aortic ectasia is stable.  The patient has has interval midline abdominal wall incision compatible with laparotomy. Trace pelvic fluid is identified. Colonic diverticuli noted without evidence for diverticulitis. Bladder wall thickness at upper limits of normal with internal air-fluid level suggesting recent catheterization. Since the prior exam, small bowel dilatation has resolved. No new area of bowel wall thickening or focal segmental dilatation is identified.  No new acute suboptimal suspect no new acute osseous abnormality. Lumbar spine degenerative change is present.  IMPRESSION: Interval resolution of previously seen small bowel dilatation after apparent midline laparotomy. Trace  pelvic fluid is likely postoperative.  No new intra-abdominal or pelvic pathology.  Persistent apparent perfusion phenomenon in the spleen with wedge-shaped hypoperfused regions which could indicate infarct, unchanged.  Increased bilateral lower lobe consolidation, right greater  than left, with areas of hypodensity within the right lower lobe collapse segment which could represent necrosis in the setting of pneumonia, drowned lung, or aspiration of lipoid material given the internal low-density.   Electronically Signed   By: Christiana Pellant M.D.   On: 08/08/2013 16:01   Ct Abdomen Pelvis W Contrast  07/18/2013   CLINICAL DATA:  Vomiting. Recently discharged after being admitted for peptic ulcers.  EXAM: CT ABDOMEN AND PELVIS WITH CONTRAST  TECHNIQUE: Multidetector CT imaging of the abdomen and pelvis was performed using the standard protocol following bolus administration of intravenous contrast.  CONTRAST:  OMNIPAQUE IOHEXOL 300 MG/ML  SOLN  COMPARISON:  06/17/2013.  FINDINGS: Multiple sigmoid colon diverticula are again demonstrated without evidence of diverticulitis. Multiple normal caliber fluid-filled loops of small bowel. Diffuse low density wall thickening in the duodenal bulb, measuring 1.2 cm in thickness on image number 26. This is producing moderate luminal narrowing. Tiny gallstones in the gallbladder. The largest measures 4 mm in maximum diameter. No gallbladder wall thickening or pericholecystic fluid.  Inhomogeneous enhancement of the liver and spleen with no discrete liver mass seen. 2.6 x 2.1 cm left adrenal mass with low density components. This was previously shown to measure 5 Hounsfield units without intravenous contrast, compatible with a benign adenoma. Small left renal cysts. Unremarkable right kidney, urinary bladder and prostate gland. No enlarged lymph nodes. No evidence of appendicitis. Dense atheromatous arterial calcifications. Bilateral renal artery stents. The no significant  change in mild bibasilar atelectasis/scarring. Mild lumbar lower thoracic spine degenerative changes.  IMPRESSION: 1. Do tonight is involving the duodenal bulb with moderate luminal narrowing. 2. Normal caliber fluid-filled small bowel loops. This can be seen with gastroenteritis. 3. Cholelithiasis without evidence of cholecystitis. 4. Extensive arterial calcifications with bilateral renal artery stents. 5. Sigmoid diverticulosis. 6. Stable left adrenal adenoma.   Electronically Signed   By: Gordan Payment M.D.   On: 07/18/2013 18:27   Nm Hepato W/eject Fract  07/11/2013   CLINICAL DATA:  Pain.  Nausea and vomiting.  EXAM: NUCLEAR MEDICINE HEPATOBILIARY IMAGING WITH GALLBLADDER EF  TECHNIQUE: Sequential images of the abdomen were obtained out to 60 minutes following intravenous administration of radiopharmaceutical. After slow intravenous infusion of 1.67 micrograms Cholecystokinin, gallbladder ejection fraction was determined.  COMPARISON:  Ultrasound 07/08/2013.  RADIOPHARMACEUTICALS:  46mCiTc-58m Choletec  FINDINGS: Liver, gallbladder, biliary system, and bowel appear normal. At 30 min, normal ejection fraction is greater than 30%.  The patient did not experience symptoms during CCK infusion.  IMPRESSION: Normal exam.   Electronically Signed   By: Maisie Fus  Register   On: 07/11/2013 12:50   Dg Chest Port 1 View  08/06/2013   CLINICAL DATA:  Pulmonary edema  EXAM: PORTABLE CHEST - 1 VIEW  COMPARISON:  08/02/2013.  FINDINGS: The cardiac silhouette is enlarged. And left internal jugular central venous catheter is appreciated with tip projecting in the region superior vena cava. Patient is status post median sternotomy and coronary artery bypass grafting. Atherosclerotic calcifications identified within the aorta. Diffuse bilateral pulmonary opacities are appreciated with greatest confluence of the right lung base. When compared to the previous study there has been decreased density and conspicuity of the findings  in the right lung base. There is a consolidative component within the right lung base may represent a component of a small effusion. The osseous structures unremarkable.  IMPRESSION: Slightly more prominent interstitial infiltrate again consistent with pulmonary edema. Focal infiltrate versus atelectasis right lung base is well small effusion.  Continued surveillance evaluation recommended. Support line unchanged.   Electronically Signed   By: Salome Holmes M.D.   On: 08/06/2013 08:05   Dg Chest Port 1 View  08/02/2013   CLINICAL DATA:  Pneumonia.  CHF.  EXAM: PORTABLE CHEST - 1 VIEW  COMPARISON:  08/01/2013.  FINDINGS: Left IJ central line is present with the tip in the upper SVC. Position is unchanged compared to yesterday. Monitoring leads project over the chest. The appearance of the lungs is slightly worse with interstitial and airspace opacities and more focal consolidation in the right midlung which was present on prior exams consistent with scarring. Cardiopericardial silhouette remains enlarged.  IMPRESSION: 1. Stable support apparatus. 2. Slight interval increase in interstitial and airspace opacities likely representing pulmonary edema and CHF.   Electronically Signed   By: Andreas Newport M.D.   On: 08/02/2013 07:38   Dg Chest Port 1 View  08/01/2013   CLINICAL DATA:  Respiratory failure, right lower extremity amputation and bowel resection.  EXAM: PORTABLE CHEST - 1 VIEW  COMPARISON:  07/31/2013  FINDINGS: Overall worsening pattern of interstitial edema/ CHF. No pneumothorax is identified. Central line positioning is stable.  IMPRESSION: Worsening interstitial edema/ CHF.   Electronically Signed   By: Irish Lack M.D.   On: 08/01/2013 07:57   Dg Chest Portable 1 View  07/31/2013   CLINICAL DATA:  Respiratory failure.  EXAM: PORTABLE CHEST - 1 VIEW  COMPARISON:  One-view chest 07/30/2013.  FINDINGS: A left IJ line is stable in position. The patient has been extubated. The heart is  enlarged. Atherosclerotic calcifications are again noted in the aortic arch. Bilateral pleural effusions and associated airspace disease is present. A diffuse interstitial pattern is unchanged. Airspace opacification at the level of the minor fissure is slightly more prominent.  IMPRESSION: 1. Slight increase an airspace opacification over the right midlung, likely in the superior segment of the right lower lobe. 2. Overall stable interstitial pattern, likely representing edema superimposed on chronic coarsening. 3. Status post extubation. 4. Persistent bilateral pleural effusions as the associated airspace disease, likely atelectasis.   Electronically Signed   By: Gennette Pac M.D.   On: 07/31/2013 07:57   Dg Chest Port 1 View  07/30/2013   CLINICAL DATA:  Respiratory failure and status post bowel resection for necrosis and right leg amputation.  EXAM: PORTABLE CHEST - 1 VIEW  COMPARISON:  07/29/2013  FINDINGS: Endotracheal tube remains with the tip approximately 5 cm above the carina. Lungs show improved aeration on the left with less prominent atelectasis/ infiltrates present. Airspace disease of the right lung is relatively stable. No pneumothorax or significant pleural effusions are seen. The heart size and mediastinal contours are stable.  IMPRESSION: Improved aeration of the left lung. Stable airspace disease of the right lung.   Electronically Signed   By: Irish Lack M.D.   On: 07/30/2013 08:07   Dg Chest Port 1 View  07/29/2013   CLINICAL DATA:  Evaluate endotracheal tube placement.  EXAM: PORTABLE CHEST - 1 VIEW  COMPARISON:  07/29/2013  FINDINGS: Endotracheal tube is 5.1 cm above the carina. Nasogastric tube extends into the abdomen but the tip is beyond the image. Jugular central venous catheter tip in the upper SVC region. Heart size is grossly normal. The thoracic aorta is heavily calcified. No change in the bilateral patchy airspace disease. Probable pleural fluid at the lung bases.   IMPRESSION: The endotracheal tube is appropriately position.  Minimal change in the bilateral parenchymal  lung disease. Probable bilateral pleural effusions.  Atherosclerotic disease.   Electronically Signed   By: Richarda Overlie M.D.   On: 07/29/2013 13:02   Dg Chest Portable 1 View  07/29/2013   CLINICAL DATA:  Respiratory distress.  EXAM: PORTABLE CHEST - 1 VIEW  COMPARISON:  07/27/2013  FINDINGS: There is hyperinflation of the lungs compatible with COPD. Prior CABG. Left central line and NG tube are unchanged. Mild cardiomegaly. Bilateral airspace opacities and interstitial disease again noted. Small bilateral effusions are stable. No significant change since prior study.  IMPRESSION: COPD.  Stable bilateral interstitial and alveolar opacities, likely edema.  Stable small bilateral effusions.   Electronically Signed   By: Charlett Nose M.D.   On: 07/29/2013 08:15   Dg Chest Port 1 View  07/28/2013   CLINICAL DATA:  Respiratory distress, shortness of breath.  EXAM: PORTABLE CHEST - 1 VIEW  COMPARISON:  Chest radiograph July 27, 2013 at 6:24 a.m.  FINDINGS: Cardiac silhouette is scratch the upper limits of normal, status post median sternotomy for apparent Coronary artery bypass grafting. Increasing central pulmonary vasculature congestion, worsening interstitial prominence with new right lower lobe airspace opacity. Small to moderate right pleural effusion extending into the fissure. Small left pleural effusion.  Interval apparent extubation. Left internal jugular central venous catheter with distal tip projecting in proximal superior vena cava. Nasogastric tube in place with side port projecting at gastroesophageal junction. Right upper extremity catheter may reflect a PICC, distal tip appears to terminate in the right axilla and was present previously. Multiple EKG lines overlie the patient and may obscure subtle underlying pathology.  IMPRESSION: Worsening interstitial and alveolar airspace opacities may  reflect fluid overload, with increasing small to moderate right, small left pleural effusions.  Apparent interval extubation without change in remaining life support lines.   Electronically Signed   By: Awilda Metro   On: 07/28/2013 00:13   Dg Chest Port 1 View  07/27/2013   CLINICAL DATA:  Check endotracheal tube placement  EXAM: PORTABLE CHEST - 1 VIEW  COMPARISON:  07/26/2013  FINDINGS: Cardiac shadow is stable. A left central venous line is again noted in the mid superior vena cava. The endotracheal tube is noted 6.6 cm above the carinal. A nasogastric catheter is seen within the stomach. Stable changes are noted in the bases bilaterally. No new focal abnormality is seen.  IMPRESSION: No significant interval change from the prior exam.   Electronically Signed   By: Alcide Clever M.D.   On: 07/27/2013 08:09   Dg Chest Port 1 View  07/26/2013   CLINICAL DATA:  Followup pneumonia  EXAM: PORTABLE CHEST - 1 VIEW  COMPARISON:  07/25/2013  FINDINGS: A left-sided jugular central venous line is again noted in the proximal superior vena cava. The endotracheal tube and nasogastric catheter are stable in appearance and within normal position. The cardiac shadow is stable. Postsurgical changes are again noted. Increasing effusion is noted in the right base. The lungs are otherwise stable.  IMPRESSION: Increasing right basilar effusion.   Electronically Signed   By: Alcide Clever M.D.   On: 07/26/2013 07:21   Dg Chest Port 1 View  07/25/2013   CLINICAL DATA:  Respiratory failure and bowel resection.  EXAM: PORTABLE CHEST - 1 VIEW  COMPARISON:  07/24/2013  FINDINGS: Endotracheal tube tip approximately 4.5 cm above the carina. Central line positioning is stable. Lungs show improved aeration of both lower lobes. Underlying chronic lung disease is stable.  IMPRESSION: Improved aeration  of both lower lobes.   Electronically Signed   By: Irish Lack M.D.   On: 07/25/2013 07:00   Dg Chest Port 1  View  07/24/2013   CLINICAL DATA:  Endotracheal tube position. Decreased oxygen saturations.  EXAM: PORTABLE CHEST - 1 VIEW  COMPARISON:  07/23/2013  FINDINGS: Endotracheal tube is in place with tip likely just above the carina. Consider withdrawing endotracheal tube approximately 2 cm. Nasogastric tube is in place with tip off the film but beyond the lower esophagus. Left IJ central line tip overlies the level of the superior vena cava. Bilateral lower lobe infiltrates/effusions again noted.  IMPRESSION: 1. Endotracheal tube tip just above the carina. 2. Bilateral lower lobe opacities, similar in appearance to prior study. Findings are consistent with infiltrates and effusions.   Electronically Signed   By: Rosalie Gums M.D.   On: 07/24/2013 01:57   Dg Chest Port 1 View  07/23/2013   CLINICAL DATA:  Ventilated patient.  History of abdominal surgery.  EXAM: PORTABLE CHEST - 1 VIEW  COMPARISON:  07/22/2013  FINDINGS: Endotracheal tube is 4.9 cm above the carina. Jugular central venous catheter in the upper SVC region. Nasogastric tube extends into the abdomen. Increased densities in the right lower chest could represent developing airspace disease or asymmetric edema. Slightly increased densities at the left lung base suggest atelectasis and suspect small pleural effusions. Heart size is stable. No evidence for a pneumothorax.  IMPRESSION: Increased densities in the right lower chest are concerning for developing airspace disease or asymmetric edema.  Probable small pleural effusions.   Electronically Signed   By: Richarda Overlie M.D.   On: 07/23/2013 08:08   Dg Chest Port 1 View  07/22/2013   CLINICAL DATA:  Status post laparotomy and small bowel resection for bowel infarction.  EXAM: PORTABLE CHEST - 1 VIEW  COMPARISON:  07/21/2013.  FINDINGS: Endotracheal tube present with the tip approximately 5 cm above the carina. Central line tip lies in the upper SVC. Lungs show stable chronic disease without evidence of  overt edema or airspace consolidation. The heart size is stable and within normal limits.  IMPRESSION: Stable chronic lung disease.   Electronically Signed   By: Irish Lack M.D.   On: 07/22/2013 10:41   Dg Chest Port 1 View  07/21/2013   CLINICAL DATA:  Endotracheal tube and line placement  EXAM: PORTABLE CHEST - 1 VIEW  COMPARISON:  07/18/2013  FINDINGS: Endotracheal tube tip lies 1.5 cm above the carina. A left internal jugular central venous line tip lies in the mid superior vena cava. A nasogastric tube has also been placed. Its tip lies at the GE junction. It will need to be further inserted to allow the tip to fully into the stomach.  No pneumothorax evident on this semi-erect study.  Lung hyperexpansion, chronic interstitial thickening and areas of lung scarring and probable basilar subsegmental atelectasis are stable. No area of focal consolidation and no overt pulmonary edema.  IMPRESSION: 1. Endotracheal tube and left internal jugular central venous line are well positioned. No pneumothorax. 2. Nasogastric tube tip lies at the GE junction. Recommend further inserting tube 10-15 cm to allow the tip to fully into the stomach. 3. No change in the appearance of the lungs when allowing for differences in technique and patient positioning.   Electronically Signed   By: Amie Portland M.D.   On: 07/21/2013 10:39   Dg Chest Port 1 View  07/18/2013   CLINICAL DATA:  Vomiting.  Smoker.  EXAM: PORTABLE CHEST - 1 VIEW  COMPARISON:  07/06/2013.  FINDINGS: Normal sized heart. Post CABG changes. Decreased inspiration with minimal bibasilar atelectasis. Atheromatous arterial calcifications. Stable right basilar scarring. Stable prominence of the interstitial markings.  IMPRESSION: 1. Poor inspiration with minimal bibasilar atelectasis. 2. Stable changes of COPD with right basilar scarring.   Electronically Signed   By: Gordan Payment M.D.   On: 07/18/2013 14:28   Dg Abd Portable 1v  07/28/2013   CLINICAL DATA:   Nasogastric tube placement.  EXAM: PORTABLE ABDOMEN - 1 VIEW  COMPARISON:  07/21/2013 CT.  FINDINGS: Nasogastric tube tip distal esophagus level. This needs to be advanced.  Gas-filled slightly dilated small bowel loops. This represents a significant improvement from the prior CT. Patient has had interval surgery.  Basilar atelectasis.  Calcified aorta branch vessels.  Common iliac stents in place.  Residual barium in the rectum.  The possibility of free intraperitoneal air cannot be assessed on a supine view.  IMPRESSION: Nasogastric tube tip distal esophagus level. This needs to be advanced.  Please see above.  This is a call report.   Electronically Signed   By: Bridgett Larsson M.D.   On: 07/28/2013 14:25   Ct Angio Abd/pel W/ And/or W/o  07/21/2013   CLINICAL DATA:  Epigastric abdominal pain, nausea, vomiting and weight loss. Endoscopy on 12/07 demonstrates severe erosive ulcerative gastritis as well as duodenitis and jejunitis.  EXAM: CT ANGIOGRAPHY ABDOMEN AND PELVIS WITH CONTRAST AND WITHOUT CONTRAST  TECHNIQUE: Multidetector CT imaging of the abdomen and pelvis was performed using the standard protocol during bolus administration of intravenous contrast. Multiplanar reconstructed images including MIPs were obtained and reviewed to evaluate the vascular anatomy.  CONTRAST:  OMNIPAQUE IOHEXOL 350 MG/ML SOLN  COMPARISON:  Standard CT of the abdomen and pelvis with contrast on 07/18/2013.  FINDINGS: The aorta and major visceral branches are heavily calcified with diffuse atherosclerosis present. The origin of the celiac axis shows critical narrowing and likely near subtotal occlusive disease. Distal branches are opacified and the celiac trunk is likely not completely occluded.  The proximal superior mesenteric artery shows heavily calcified plaque extending for a long distance down the trunk of the SMA. The proximal SMA trunk is completely occluded with reconstitution more distally by jejunal branches  related to collateral reconstitution from celiac and IMA supplied. The inferior mesenteric artery origin is heavily calcified but open.  Bilateral renal artery stents are identified which are open. Bilateral common iliac artery stents are also present which are open. The external iliac arteries are diffusely diseased with heavily calcified plaque.  There is likely critical stenosis and near subtotal occlusion at the level of the mid right external iliac artery. Moderate narrowing of the distal external iliac artery is also present just above the inguinal ligament approaching 70-75% narrowing. At the level of the right groin, postsurgical changes are seen likely related to prior femoral bypass with an occluded proximal bypass graft present. The native SFA is also occluded at its origin.  Diffuse disease of the left external iliac artery present without significant stenosis. The common femoral artery shows irregular plaque causing approximately 50% narrowing just below the inguinal ligament. Postsurgical changes are seen in the left groin with an open proximal bypass graft identified in the anterior thigh. The native SFA is occluded at its origin.  Nonvascular evaluation shows significant interval distension of small bowel loops which are diffusely dilated and fluid-filled. There may be some pneumatosis in the transverse  duodenum and proximal jejunum as well, concerning for potential evolution of bowel ischemia to necrosis. The colon is completely decompressed. There is no evidence of free intraperitoneal air or focal abscess. The gallbladder shows some increased distention since the prior study. There is no evidence of overt biliary obstruction, however. Stable left adrenal mass identified. This is fairly low density and likely an incidental adenoma.  Review of the MIP images confirms the above findings.  IMPRESSION: 1. 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 2. 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 3. Heavily calcified but open inferior mesenteric artery. 4. 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 Postsurgical changes are evident in the right groin with occlusion of a previously placed proximal bypass graft. 5. Open left femoral bypass graft. 6. Significant interval distension of small bowel loops which are diffusely dilated and filled with fluid. There also may be a pneumatosis in the duodenum and proximal jejunum concerning for severe ischemia/necrosis. Surgical consultation would be recommended. Critical Value/emergent results were called by telephone at the time of interpretation on 07/21/2013 at 8:00 AM to Dr.Prashant Thedore Mins, who verbally acknowledged these results.   Electronically Signed   By: Irish Lack M.D.   On: 07/21/2013 08:35    CBC  Recent Labs Lab 08/08/13 1704 08/09/13 0529 08/11/13 0852 08/12/13 0602 08/13/13 0017  WBC 7.6 7.6 16.5* 17.8* 20.5*  HGB 9.8* 9.7* 9.4* 8.8* 9.0*  HCT 29.8* 30.2* 28.5* 27.0* 27.5*  PLT 232 226 212 197 205  MCV 92.0 93.5 95.0 95.1 94.8  MCH 30.2 30.0 31.3 31.0 31.0  MCHC 32.9 32.1 33.0 32.6 32.7  RDW 19.3* 19.8* 20.6* 21.1* 20.8*  LYMPHSABS  --  1.4 0.9  --   --   MONOABS  --  0.5 1.0  --   --   EOSABS  --  0.1 0.0  --   --   BASOSABS  --  0.0 0.0  --   --     Chemistries   Recent Labs Lab 08/07/13 0430 08/08/13 0620 08/09/13 0529 08/10/13 0830 08/12/13 0602 08/13/13 0017  NA 137 139 137 139 144 143  K 3.8 3.0* 3.4* 4.2 3.3* 3.3*  CL 95* 95* 98 101 105 105  CO2 35* 36* 34* 30 27 27   GLUCOSE 87 81 67* 67* 70 98  BUN 43* 39* 30* 29* 36* 35*  CREATININE 0.81 0.87 0.77 0.76 0.65 0.62  CALCIUM 8.0* 8.0* 7.8* 7.7* 7.8* 7.8*  MG 1.9  --   --   --   --   --   AST  --   --  52*  --   --  38*  ALT  --   --  98*  --   --  40  ALKPHOS  --   --  226*   --   --  156*  BILITOT  --   --  1.8*  --   --  2.2*   ------------------------------------------------------------------------------------------------------------------ estimated creatinine clearance is 64.6 ml/min (by C-G formula based on Cr of 0.62). ------------------------------------------------------------------------------------------------------------------ No results found for this basename: HGBA1C,  in the last 72 hours ------------------------------------------------------------------------------------------------------------------  Recent Labs  08/11/13 0852  TRIG 100   ------------------------------------------------------------------------------------------------------------------ No results found for this basename: TSH, T4TOTAL, FREET3, T3FREE, THYROIDAB,  in the last 72 hours ------------------------------------------------------------------------------------------------------------------ No results found for this basename: VITAMINB12, FOLATE, FERRITIN,  TIBC, IRON, RETICCTPCT,  in the last 72 hours  Coagulation profile No results found for this basename: INR, PROTIME,  in the last 168 hours  No results found for this basename: DDIMER,  in the last 72 hours  Cardiac Enzymes No results found for this basename: CK, CKMB, TROPONINI, MYOGLOBIN,  in the last 168 hours ------------------------------------------------------------------------------------------------------------------ No components found with this basename: POCBNP,

## 2013-08-13 NOTE — Discharge Summary (Signed)
Physician Discharge Summary  [name] WJX:[id number] DOB: [birth year] DOA: 07/18/2013  PCP: [name], MD  Admit date: 07/18/2013 Discharge date: 08/13/2013  Time spent: 60 minutes  Recommendations for Outpatient Follow-up:  [facility]  Discharge Diagnoses:  Principal Problem:   Intestinal ischemia and infarction Active Problems:   COPD   HTN (hypertension)   PVD (peripheral vascular disease) with claudication   Systolic CHF   Dehydration   Abdominal pain, epigastric   Gastric ulcer   Partial gastric outlet obstruction   Protein-calorie malnutrition, severe   Gastroparesis   Other specified gastritis without mention of hemorrhage   Acute respiratory failure   Hypovolemic shock   E. coli pneumonia   Palliative care encounter   Discharge Condition: Guarded  Diet recommendation: None for the time being  [facility] Weights   08/10/13 0450 08/11/13 0417 08/12/13 0424  Weight: 64.955 kg (143 lb 3.2 oz) 65.137 kg (143 lb 9.6 oz) 58.1 kg (128 lb 1.4 oz)    History of present illness: Presented with patient have had recurrent episodes of nausea vomiting and diarrhea with a number of recent hospitalizations in the beginning he was diagnosed with no virus. His symptoms would improve but then would recur again. Patient was just discharged a week ago for presumed gastroenteritis and was found to have gastric ulcers by EGD he was discharged to home and at first was doing better but soon developed nausea and vomiting again. At this point he could not even tolerate water. Family brought him back and repeat CT showed now duodenal thickening resulting in luminal narrowing. This is new from prior CT scan and beginning of November and a recent EGD. He denies any fever or chills. He continues to have some loose stools and reports epigastric discomfort. Family feels that he slightly dehydrated. Hospitalist was called for admission patient denies any chest pain shortness of breath  reports that he has almost stopped smoking. When he was here last time patient was discharged home off oxygen but he did require few days while in hospital.     Hospital Course:  This is a 78 year old male with multiple medical problems, including ischemic cardiopathy, severe PAD, was admitted for abdominal pain and CT found him to have ischemic changes, small bowel distention. Patient was found to be in shock with respiratory failure and was urgently intubated transferred to the ICU. Patient was found to have shock, expiratory laparotomy with enterectomy of the jejunum and proximal ileum was conducted. Patient had small bowel resection terminal ileum 2 anastomosis to reestablish intestinal continuity. He then had peripheral arterial disease of the right lower extremity and had endarterectomy, with above-the-knee amputation.  Below with this active and current problems. Patient Hospital length of stay to date is 26 days. Palliative care is with the family several times as well as general surgery and several other physicians. Patient's current outlook is somewhat bleak and entered. Patient's family as well as the patient changed his CODE STATUS from DO NOT RESUSCITATE DO NOT INTUBATE due to partial CODE STATUS with no CPR or intubation however medications can be used. Patient's family does not wish for comfort measures at this time nor did he wish to speak to hospice at this time. Although several physicians again have explained to the patient as well as the family that nothing further could be done surgically, patient's family continues to wish everything to be done. Patient's family requested him to be transferred to [facility] for further care.  Sepsis  due to Intestinal ischemia and infarction s/p extensive SB resection with Shock liver  -12/8 Exploratory laparotomy with entrectomy (patient required pressor and TNA which could not be tolerated)  -No longer having nausea/vomiting  -Continue Remeron and  reglan  -Shock liver, resolved, LFTs improved  -NGTube in place  -Feeds held as patient is having abdominal pain.  -Surgery to reconsulted for staple removal as well as continued abdominal pain.  -? New ischemia, patient currently has abdominal pain as well as leukocytosis  -CT Abd/pelvis Distended colon suggesting ileus, no abscess, ?cholecysititis  -US Abdomen: Distended gallbladder containing sludge and gallstones without sonographic evidence of cholecystitis.  -No indication for intervention at this time.  -Will check UA   Acute resp failure due to COPD Asp PNA ( E. Coli PNA) and Resp + metab alkalosis/Required intubation for Rt AKA 12/14.  - Hold lasix. Watch strict I and O's.  - Off bipap. moniotr electrolytes replete as needed.  - completed course of antibiotics.   CAD/ ischemic cardiomyopathy -EF 35%  -Stable, Lasix being held   Vasculopathy -ischemic RLE > s/p R AKA 12/16  -Stable   NSTEMI  -Stable   A fib  -Continue amiodarone   Thrombocytopenia with Coagulopathy  -With low fibrinogen, low grade DIC picture likely related to ischemic leg >> improved after amputation.  -SQ heparin since 12/19 for DVT prevention  -Follow CBC   Nutrition  -Currently has NG tube.  -Patient unable to tolerate feeds at this time. Will hold tube feeds.   Procedures: 12/8 intubated, shock, surgical evaluation, DNR established, Exploratory laparotomy with entrectomy (majority of jejunum and proximal ileum)  12/9 Segmental small bowel resection terminal ileum Two anastomoses to re-establish intestinal continuity  12/11 requires higher PEEP/FIO2  12/12 s/p R LE endarterectomy (Dr Early)  12/16 To OR for Rt AKA, returned intubated with 80% fio2  07/30/13 - EXTUBATED  12/18 intermittent NIMVS on 70% NRB  08/02/13: using bipap prn self directed for resp comfort. Good diuresis with lasix but RN says still sounds wet. Still hard to wean off NRB. Otherwise ok. Wants to go home  12/30: Abd [place]:  Distended gallbladder containing sludge and gallstones without sonographic evidence of cholecystitis.    Consultations: Vascular surgery General surgery PCCM Gastroenterology Palliative care  Discharge Exam: Filed Vitals:   08/13/13 1552  BP: 106/53  Pulse: 95  Temp: 97.3 F (36.3 C)  Resp: 18   Exam  General: Well developed, malnourished, mild distress, appears stated age  HEENT: NCAT, PERRLA, EOMI, Anicteic Sclera, mucous membranes dry  Neck: Supple, no JVD, no masses  Cardiovascular: S1 S2 auscultated, no rubs, murmurs or gallops. Regular rate and rhythm.  Respiratory: Clear to auscultation bilaterally with equal chest rise  Abdomen: Soft, tender to palpation particularly on the right side, nondistended, + bowel sounds, staples clean  Extremities: warm dry without cyanosis clubbing or edema in LLE, RLE AKA, stump clean  Neuro: AAOx3, cranial nerves grossly intact.  Skin: no rashes  Discharge Instructions  Discharge Orders   Future Appointments Provider Department Dept Phone   09/02/2013 10:30 AM Corwin Levins, MD [facility] [phone]   Future Orders Complete By Expires   Diet - low sodium heart healthy  As directed    Increase activity slowly  As directed        Medication List    STOP taking these medications       acetaminophen 500 MG tablet  Commonly known as:  TYLENOL  ALPRAZolam 0.25 MG tablet  Commonly known as:  XANAX     atorvastatin 40 MG tablet  Commonly known as:  LIPITOR     carvedilol 3.125 MG tablet  Commonly known as:  COREG     cilostazol 100 MG tablet  Commonly known as:  PLETAL     feeding supplement (RESOURCE BREEZE) Liqd     ferrous sulfate 325 (65 FE) MG tablet     furosemide 20 MG tablet  Commonly known as:  LASIX     nitroGLYCERIN 0.4 MG SL tablet  Commonly known as:  NITROSTAT     pantoprazole 40 MG tablet  Commonly known as:  PROTONIX  Replaced by:  pantoprazole sodium 40 mg/20 mL Pack       Potassium Chloride ER 20 MEQ Tbcr     promethazine 25 MG tablet  Commonly known as:  PHENERGAN     ranitidine 150 MG tablet  Commonly known as:  ZANTAC      TAKE these medications       alum & mag hydroxide-simeth 200-200-20 MG/5ML suspension  Commonly known as:  MAALOX/MYLANTA  Take 15 mLs by mouth every 6 (six) hours as needed for indigestion or heartburn.     amiodarone 200 MG tablet  Commonly known as:  PACERONE  Take 1 tablet (200 mg total) by mouth daily.     arformoterol 15 MCG/2ML Nebu  Commonly known as:  BROVANA  Take 2 mLs (15 mcg total) by nebulization 2 (two) times daily.     dextrose 5 % SOLN 50 mL with ceFEPIme 2 G SOLR 2 g  Inject 2 g into the vein every 12 (twelve) hours.     fentaNYL 0.05 MG/ML injection  Commonly known as:  SUBLIMAZE  Inject 0.25 mLs (12.5 mcg total) into the vein every 2 (two) hours as needed for severe pain.     heparin 5000 UNIT/ML injection  Inject 1 mL (5,000 Units total) into the skin every 8 (eight) hours.     LORazepam 2 MG/ML injection  Commonly known as:  ATIVAN  Inject 0.25 mLs (0.5 mg total) into the vein once.     metoCLOPramide 5 MG tablet  Commonly known as:  REGLAN  Take 1 tablet (5 mg total) by mouth 4 (four) times daily -  before meals and at bedtime.     mirtazapine 7.5 MG tablet  Commonly known as:  REMERON  Take 1 tablet (7.5 mg total) by mouth at bedtime.     morphine CONCENTRATE 10 mg / 0.5 ml concentrated solution  Take 0.5 mLs (10 mg total) by mouth every 2 (two) hours as needed for severe pain.     ondansetron 4 MG/2ML Soln injection  Commonly known as:  ZOFRAN  Inject 2 mLs (4 mg total) into the vein every 6 (six) hours as needed for nausea or vomiting.     pantoprazole sodium 40 mg/20 mL Pack  Commonly known as:  PROTONIX  Place 20 mLs (40 mg total) into feeding tube daily at 12 noon.     sertraline 100 MG tablet  Commonly known as:  ZOLOFT  Take 1 tablet (100 mg total) by mouth daily.        No Known Allergies    The results of significant diagnostics from this hospitalization (including imaging, microbiology, ancillary and laboratory) are listed below for reference.    Significant Diagnostic Studies: Ct Abdomen Pelvis W Contrast  08/12/2013   CLINICAL DATA:  Abdominal pain for small  bowel resection.  EXAM: CT ABDOMEN AND PELVIS WITH CONTRAST  TECHNIQUE: Multidetector CT imaging of the abdomen and pelvis was performed using the standard protocol following bolus administration of intravenous contrast.  CONTRAST:  80 cc Omnipaque 300 intravenous  COMPARISON:  08/08/2013  FINDINGS: BODY WALL: No complicating factors related to recent midline laparotomy incision.  LOWER CHEST: Coronary artery atherosclerosis. Bulky mitral annular calcification. The left ventricular cavity appears dilated with subendocardial low-attenuation which is chronic, likely from previous infarct. Mildly decreased consolidative opacities in the right with lower lobes. Small bilateral pleural effusions, with unchanged mildly increased density on the right.  ABDOMEN/PELVIS:  Liver: No focal abnormality.  Biliary: Distended gallbladder with layering stones. There is right upper quadrant ascites but no definitive pericholecystic fat infiltration.  Pancreas: Atrophy without focal abnormality.  Spleen: Similar extent of wedge-shaped hypo enhancement in the peripheral spleen compatible with previous infarctions in this patient with high-grade celiac axis stenosis.  Adrenals: 2.5 cm heterogeneous mass in the left adrenal is relatively stable since 2009, most consistent with adenoma on previous non contrast imaging.  Kidneys and ureters: Bilateral renal hilar calcifications which are likely atherosclerotic. No hydronephrosis. Low attenuating foci in the renal cortex are simple or too small to characterize.  Bladder: Gas in the bladder.  No Foley catheter currently present.  Bowel: Multiple enteroenterostomy shows no evidence of  leakage. No abscess. As permitted by positive enteric contrast, No evidence of non enhancing bowel wall. No pneumatosis. Colon is diffusely distended, with fluid levels. Distal colonic diverticulosis. Nasogastric tube in good position.  Retroperitoneum: No mass or adenopathy.  Peritoneum: No free fluid or gas.  Vascular: Extensive atherosclerosis. The degree of aortic branch vessels stenoses is better established on previous CT angiography. No definitive change from prior; proximal celiac and SMA stenosis is severe. Continued occlusion of the right superficial femoral artery and femoral graft. Probable left SFA occlusion. Surgical clips near the right hepatic artery which is irregular in luminal contour.  OSSEOUS: No acute abnormalities.  IMPRESSION: 1. Distended colon suggesting ileus. 2. No evidence of leak or abscess status post recent small bowel section. 3. Cholelithiasis with dilated gallbladder. Correlate right upper quadrant symptoms. If concern for acute cholecystitis, recommend sonography. 4. Pneumaturia. Correlate with urinalysis. 5. Partial clearing of lower lobe pneumonia.   Electronically Signed   By: Tiburcio Pea M.D.   On: 08/12/2013 09:34   Ct Abdomen Pelvis W Contrast  08/08/2013   CLINICAL DATA:  Nausea and vomiting.  EXAM: CT ABDOMEN AND PELVIS WITH CONTRAST  TECHNIQUE: Multidetector CT imaging of the abdomen and pelvis was performed using the standard protocol following bolus administration of intravenous contrast.  CONTRAST:  OMNIPAQUE IOHEXOL 300 MG/ML SOLN. Hand injection was performed through central line without difficulty or complication.  COMPARISON:  CT abdomen/pelvis 07/21/2013  FINDINGS: Increased bilateral small to moderate pleural effusions are noted with associated compressive atelectasis. Portion of collapsed right lower lobe is hypodense centrally which could indicate necrosis or fluid/drowned lung. Heart size is mildly enlarged.  Wedge-shaped areas of peripheral  splenic hypoenhancement are reidentified, unchanged since the prior exam, which again could represent infarcts or vascular perfusion phenomenon.  Layering sludge or stones noted within the gallbladder. Liver, right adrenal gland, right kidney, and pancreas appear normal. 2.5 cm mixed density left adrenal mass is stable in appearance. Dense atheromatous calcification of the non aneurysmal abdominal aorta and branch vessels again noted, with renal arterial stents in place. Minimal infrarenal abdominal aortic ectasia is stable.  The  patient has has interval midline abdominal wall incision compatible with laparotomy. Trace pelvic fluid is identified. Colonic diverticuli noted without evidence for diverticulitis. Bladder wall thickness at upper limits of normal with internal air-fluid level suggesting recent catheterization. Since the prior exam, small bowel dilatation has resolved. No new area of bowel wall thickening or focal segmental dilatation is identified.  No new acute suboptimal suspect no new acute osseous abnormality. Lumbar spine degenerative change is present.  IMPRESSION: Interval resolution of previously seen small bowel dilatation after apparent midline laparotomy. Trace pelvic fluid is likely postoperative.  No new intra-abdominal or pelvic pathology.  Persistent apparent perfusion phenomenon in the spleen with wedge-shaped hypoperfused regions which could indicate infarct, unchanged.  Increased bilateral lower lobe consolidation, right greater than left, with areas of hypodensity within the right lower lobe collapse segment which could represent necrosis in the setting of pneumonia, drowned lung, or aspiration of lipoid material given the internal low-density.   Electronically Signed   By: Christiana Pellant M.D.   On: 08/08/2013 16:01   Ct Abdomen Pelvis W Contrast  07/18/2013   CLINICAL DATA:  Vomiting. Recently discharged after being admitted for peptic ulcers.  EXAM: CT ABDOMEN AND PELVIS WITH  CONTRAST  TECHNIQUE: Multidetector CT imaging of the abdomen and pelvis was performed using the standard protocol following bolus administration of intravenous contrast.  CONTRAST:  OMNIPAQUE IOHEXOL 300 MG/ML  SOLN  COMPARISON:  06/17/2013.  FINDINGS: Multiple sigmoid colon diverticula are again demonstrated without evidence of diverticulitis. Multiple normal caliber fluid-filled loops of small bowel. Diffuse low density wall thickening in the duodenal bulb, measuring 1.2 cm in thickness on image number 26. This is producing moderate luminal narrowing. Tiny gallstones in the gallbladder. The largest measures 4 mm in maximum diameter. No gallbladder wall thickening or pericholecystic fluid.  Inhomogeneous enhancement of the liver and spleen with no discrete liver mass seen. 2.6 x 2.1 cm left adrenal mass with low density components. This was previously shown to measure 5 Hounsfield units without intravenous contrast, compatible with a benign adenoma. Small left renal cysts. Unremarkable right kidney, urinary bladder and prostate gland. No enlarged lymph nodes. No evidence of appendicitis. Dense atheromatous arterial calcifications. Bilateral renal artery stents. The no significant change in mild bibasilar atelectasis/scarring. Mild lumbar lower thoracic spine degenerative changes.  IMPRESSION: 1. Do tonight is involving the duodenal bulb with moderate luminal narrowing. 2. Normal caliber fluid-filled small bowel loops. This can be seen with gastroenteritis. 3. Cholelithiasis without evidence of cholecystitis. 4. Extensive arterial calcifications with bilateral renal artery stents. 5. Sigmoid diverticulosis. 6. Stable left adrenal adenoma.   Electronically Signed   By: Gordan Payment M.D.   On: 07/18/2013 18:27   US Abdomen Limited  08/12/2013   CLINICAL DATA:  Abdominal pain.  EXAM: US ABDOMEN LIMITED - RIGHT UPPER QUADRANT  COMPARISON:  None.  FINDINGS: Gallbladder  The gallbladder is distended measuring 4.5  cm in diameter. The gallbladder contains echogenic gallstones and sludge. There is no evidence of gallbladder wall thickening at 2.2 mm, nor pericholecystic fluid, nor a sonographic Murphy's sign.  Common bile duct  Diameter: 5.1 mm  Liver:  Limited evaluation the liver is grossly unremarkable. Evaluation is degraded by bowel gas.  IMPRESSION: Distended gallbladder containing sludge and gallstones without sonographic evidence of cholecystitis.   Electronically Signed   By: Salome Holmes M.D.   On: 08/12/2013 16:22   Dg Chest Port 1 View  08/06/2013   CLINICAL DATA:  Pulmonary edema  EXAM:  PORTABLE CHEST - 1 VIEW  COMPARISON:  08/02/2013.  FINDINGS: The cardiac silhouette is enlarged. And left internal jugular central venous catheter is appreciated with tip projecting in the region superior vena cava. Patient is status post median sternotomy and coronary artery bypass grafting. Atherosclerotic calcifications identified within the aorta. Diffuse bilateral pulmonary opacities are appreciated with greatest confluence of the right lung base. When compared to the previous study there has been decreased density and conspicuity of the findings in the right lung base. There is a consolidative component within the right lung base may represent a component of a small effusion. The osseous structures unremarkable.  IMPRESSION: Slightly more prominent interstitial infiltrate again consistent with pulmonary edema. Focal infiltrate versus atelectasis right lung base is well small effusion. Continued surveillance evaluation recommended. Support line unchanged.   Electronically Signed   By: Salome Holmes M.D.   On: 08/06/2013 08:05   Dg Chest Port 1 View  08/02/2013   CLINICAL DATA:  Pneumonia.  CHF.  EXAM: PORTABLE CHEST - 1 VIEW  COMPARISON:  08/01/2013.  FINDINGS: Left IJ central line is present with the tip in the upper SVC. Position is unchanged compared to yesterday. Monitoring leads project over the chest. The  appearance of the lungs is slightly worse with interstitial and airspace opacities and more focal consolidation in the right midlung which was present on prior exams consistent with scarring. Cardiopericardial silhouette remains enlarged.  IMPRESSION: 1. Stable support apparatus. 2. Slight interval increase in interstitial and airspace opacities likely representing pulmonary edema and CHF.   Electronically Signed   By: Andreas Newport M.D.   On: 08/02/2013 07:38   Dg Chest Port 1 View  08/01/2013   CLINICAL DATA:  Respiratory failure, right lower extremity amputation and bowel resection.  EXAM: PORTABLE CHEST - 1 VIEW  COMPARISON:  07/31/2013  FINDINGS: Overall worsening pattern of interstitial edema/ CHF. No pneumothorax is identified. Central line positioning is stable.  IMPRESSION: Worsening interstitial edema/ CHF.   Electronically Signed   By: Irish Lack M.D.   On: 08/01/2013 07:57   Dg Chest Portable 1 View  07/31/2013   CLINICAL DATA:  Respiratory failure.  EXAM: PORTABLE CHEST - 1 VIEW  COMPARISON:  One-view chest 07/30/2013.  FINDINGS: A left IJ line is stable in position. The patient has been extubated. The heart is enlarged. Atherosclerotic calcifications are again noted in the aortic arch. Bilateral pleural effusions and associated airspace disease is present. A diffuse interstitial pattern is unchanged. Airspace opacification at the level of the minor fissure is slightly more prominent.  IMPRESSION: 1. Slight increase an airspace opacification over the right midlung, likely in the superior segment of the right lower lobe. 2. Overall stable interstitial pattern, likely representing edema superimposed on chronic coarsening. 3. Status post extubation. 4. Persistent bilateral pleural effusions as the associated airspace disease, likely atelectasis.   Electronically Signed   By: Gennette Pac M.D.   On: 07/31/2013 07:57   Dg Chest Port 1 View  07/30/2013   CLINICAL DATA:  Respiratory  failure and status post bowel resection for necrosis and right leg amputation.  EXAM: PORTABLE CHEST - 1 VIEW  COMPARISON:  07/29/2013  FINDINGS: Endotracheal tube remains with the tip approximately 5 cm above the carina. Lungs show improved aeration on the left with less prominent atelectasis/ infiltrates present. Airspace disease of the right lung is relatively stable. No pneumothorax or significant pleural effusions are seen. The heart size and mediastinal contours are stable.  IMPRESSION: Improved  aeration of the left lung. Stable airspace disease of the right lung.   Electronically Signed   By: Irish Lack M.D.   On: 07/30/2013 08:07   Dg Chest Port 1 View  07/29/2013   CLINICAL DATA:  Evaluate endotracheal tube placement.  EXAM: PORTABLE CHEST - 1 VIEW  COMPARISON:  07/29/2013  FINDINGS: Endotracheal tube is 5.1 cm above the carina. Nasogastric tube extends into the abdomen but the tip is beyond the image. Jugular central venous catheter tip in the upper SVC region. Heart size is grossly normal. The thoracic aorta is heavily calcified. No change in the bilateral patchy airspace disease. Probable pleural fluid at the lung bases.  IMPRESSION: The endotracheal tube is appropriately position.  Minimal change in the bilateral parenchymal lung disease. Probable bilateral pleural effusions.  Atherosclerotic disease.   Electronically Signed   By: Richarda Overlie M.D.   On: 07/29/2013 13:02   Dg Chest Portable 1 View  07/29/2013   CLINICAL DATA:  Respiratory distress.  EXAM: PORTABLE CHEST - 1 VIEW  COMPARISON:  07/27/2013  FINDINGS: There is hyperinflation of the lungs compatible with COPD. Prior CABG. Left central line and NG tube are unchanged. Mild cardiomegaly. Bilateral airspace opacities and interstitial disease again noted. Small bilateral effusions are stable. No significant change since prior study.  IMPRESSION: COPD.  Stable bilateral interstitial and alveolar opacities, likely edema.  Stable small  bilateral effusions.   Electronically Signed   By: Charlett Nose M.D.   On: 07/29/2013 08:15   Dg Chest Port 1 View  07/28/2013   CLINICAL DATA:  Respiratory distress, shortness of breath.  EXAM: PORTABLE CHEST - 1 VIEW  COMPARISON:  Chest radiograph July 27, 2013 at 6:24 a.m.  FINDINGS: Cardiac silhouette is scratch the upper limits of normal, status post median sternotomy for apparent Coronary artery bypass grafting. Increasing central pulmonary vasculature congestion, worsening interstitial prominence with new right lower lobe airspace opacity. Small to moderate right pleural effusion extending into the fissure. Small left pleural effusion.  Interval apparent extubation. Left internal jugular central venous catheter with distal tip projecting in proximal superior vena cava. Nasogastric tube in place with side port projecting at gastroesophageal junction. Right upper extremity catheter may reflect a PICC, distal tip appears to terminate in the right axilla and was present previously. Multiple EKG lines overlie the patient and may obscure subtle underlying pathology.  IMPRESSION: Worsening interstitial and alveolar airspace opacities may reflect fluid overload, with increasing small to moderate right, small left pleural effusions.  Apparent interval extubation without change in remaining life support lines.   Electronically Signed   By: Awilda Metro   On: 07/28/2013 00:13   Dg Chest Port 1 View  07/27/2013   CLINICAL DATA:  Check endotracheal tube placement  EXAM: PORTABLE CHEST - 1 VIEW  COMPARISON:  07/26/2013  FINDINGS: Cardiac shadow is stable. A left central venous line is again noted in the mid superior vena cava. The endotracheal tube is noted 6.6 cm above the carinal. A nasogastric catheter is seen within the stomach. Stable changes are noted in the bases bilaterally. No new focal abnormality is seen.  IMPRESSION: No significant interval change from the prior exam.   Electronically Signed    By: Alcide Clever M.D.   On: 07/27/2013 08:09   Dg Chest Port 1 View  07/26/2013   CLINICAL DATA:  Followup pneumonia  EXAM: PORTABLE CHEST - 1 VIEW  COMPARISON:  07/25/2013  FINDINGS: A left-sided jugular central venous line  is again noted in the proximal superior vena cava. The endotracheal tube and nasogastric catheter are stable in appearance and within normal position. The cardiac shadow is stable. Postsurgical changes are again noted. Increasing effusion is noted in the right base. The lungs are otherwise stable.  IMPRESSION: Increasing right basilar effusion.   Electronically Signed   By: Alcide Clever M.D.   On: 07/26/2013 07:21   Dg Chest Port 1 View  07/25/2013   CLINICAL DATA:  Respiratory failure and bowel resection.  EXAM: PORTABLE CHEST - 1 VIEW  COMPARISON:  07/24/2013  FINDINGS: Endotracheal tube tip approximately 4.5 cm above the carina. Central line positioning is stable. Lungs show improved aeration of both lower lobes. Underlying chronic lung disease is stable.  IMPRESSION: Improved aeration of both lower lobes.   Electronically Signed   By: Irish Lack M.D.   On: 07/25/2013 07:00   Dg Chest Port 1 View  07/24/2013   CLINICAL DATA:  Endotracheal tube position. Decreased oxygen saturations.  EXAM: PORTABLE CHEST - 1 VIEW  COMPARISON:  07/23/2013  FINDINGS: Endotracheal tube is in place with tip likely just above the carina. Consider withdrawing endotracheal tube approximately 2 cm. Nasogastric tube is in place with tip off the film but beyond the lower esophagus. Left IJ central line tip overlies the level of the superior vena cava. Bilateral lower lobe infiltrates/effusions again noted.  IMPRESSION: 1. Endotracheal tube tip just above the carina. 2. Bilateral lower lobe opacities, similar in appearance to prior study. Findings are consistent with infiltrates and effusions.   Electronically Signed   By: Rosalie Gums M.D.   On: 07/24/2013 01:57   Dg Chest Port 1 View  07/23/2013    CLINICAL DATA:  Ventilated patient.  History of abdominal surgery.  EXAM: PORTABLE CHEST - 1 VIEW  COMPARISON:  07/22/2013  FINDINGS: Endotracheal tube is 4.9 cm above the carina. Jugular central venous catheter in the upper SVC region. Nasogastric tube extends into the abdomen. Increased densities in the right lower chest could represent developing airspace disease or asymmetric edema. Slightly increased densities at the left lung base suggest atelectasis and suspect small pleural effusions. Heart size is stable. No evidence for a pneumothorax.  IMPRESSION: Increased densities in the right lower chest are concerning for developing airspace disease or asymmetric edema.  Probable small pleural effusions.   Electronically Signed   By: Richarda Overlie M.D.   On: 07/23/2013 08:08   Dg Chest Port 1 View  07/22/2013   CLINICAL DATA:  Status post laparotomy and small bowel resection for bowel infarction.  EXAM: PORTABLE CHEST - 1 VIEW  COMPARISON:  07/21/2013.  FINDINGS: Endotracheal tube present with the tip approximately 5 cm above the carina. Central line tip lies in the upper SVC. Lungs show stable chronic disease without evidence of overt edema or airspace consolidation. The heart size is stable and within normal limits.  IMPRESSION: Stable chronic lung disease.   Electronically Signed   By: Irish Lack M.D.   On: 07/22/2013 10:41   Dg Chest Port 1 View  07/21/2013   CLINICAL DATA:  Endotracheal tube and line placement  EXAM: PORTABLE CHEST - 1 VIEW  COMPARISON:  07/18/2013  FINDINGS: Endotracheal tube tip lies 1.5 cm above the carina. A left internal jugular central venous line tip lies in the mid superior vena cava. A nasogastric tube has also been placed. Its tip lies at the GE junction. It will need to be further inserted to allow the tip  to fully into the stomach.  No pneumothorax evident on this semi-erect study.  Lung hyperexpansion, chronic interstitial thickening and areas of lung scarring and probable  basilar subsegmental atelectasis are stable. No area of focal consolidation and no overt pulmonary edema.  IMPRESSION: 1. Endotracheal tube and left internal jugular central venous line are well positioned. No pneumothorax. 2. Nasogastric tube tip lies at the GE junction. Recommend further inserting tube 10-15 cm to allow the tip to fully into the stomach. 3. No change in the appearance of the lungs when allowing for differences in technique and patient positioning.   Electronically Signed   By: Amie Portland M.D.   On: 07/21/2013 10:39   Dg Chest Port 1 View  07/18/2013   CLINICAL DATA:  Vomiting.  Smoker.  EXAM: PORTABLE CHEST - 1 VIEW  COMPARISON:  07/06/2013.  FINDINGS: Normal sized heart. Post CABG changes. Decreased inspiration with minimal bibasilar atelectasis. Atheromatous arterial calcifications. Stable right basilar scarring. Stable prominence of the interstitial markings.  IMPRESSION: 1. Poor inspiration with minimal bibasilar atelectasis. 2. Stable changes of COPD with right basilar scarring.   Electronically Signed   By: Gordan Payment M.D.   On: 07/18/2013 14:28   Dg Abd Portable 1v  07/28/2013   CLINICAL DATA:  Nasogastric tube placement.  EXAM: PORTABLE ABDOMEN - 1 VIEW  COMPARISON:  07/21/2013 CT.  FINDINGS: Nasogastric tube tip distal esophagus level. This needs to be advanced.  Gas-filled slightly dilated small bowel loops. This represents a significant improvement from the prior CT. Patient has had interval surgery.  Basilar atelectasis.  Calcified aorta branch vessels.  Common iliac stents in place.  Residual barium in the rectum.  The possibility of free intraperitoneal air cannot be assessed on a supine view.  IMPRESSION: Nasogastric tube tip distal esophagus level. This needs to be advanced.  Please see above.  This is a call report.   Electronically Signed   By: Bridgett Larsson M.D.   On: 07/28/2013 14:25   Ct Angio Abd/pel W/ And/or W/o  07/21/2013   CLINICAL DATA:  Epigastric  abdominal pain, nausea, vomiting and weight loss. Endoscopy on 12/07 demonstrates severe erosive ulcerative gastritis as well as duodenitis and jejunitis.  EXAM: CT ANGIOGRAPHY ABDOMEN AND PELVIS WITH CONTRAST AND WITHOUT CONTRAST  TECHNIQUE: Multidetector CT imaging of the abdomen and pelvis was performed using the standard protocol during bolus administration of intravenous contrast. Multiplanar reconstructed images including MIPs were obtained and reviewed to evaluate the vascular anatomy.  CONTRAST:  OMNIPAQUE IOHEXOL 350 MG/ML SOLN  COMPARISON:  Standard CT of the abdomen and pelvis with contrast on 07/18/2013.  FINDINGS: The aorta and major visceral branches are heavily calcified with diffuse atherosclerosis present. The origin of the celiac axis shows critical narrowing and likely near subtotal occlusive disease. Distal branches are opacified and the celiac trunk is likely not completely occluded.  The proximal superior mesenteric artery shows heavily calcified plaque extending for a long distance down the trunk of the SMA. The proximal SMA trunk is completely occluded with reconstitution more distally by jejunal branches related to collateral reconstitution from celiac and IMA supplied. The inferior mesenteric artery origin is heavily calcified but open.  Bilateral renal artery stents are identified which are open. Bilateral common iliac artery stents are also present which are open. The external iliac arteries are diffusely diseased with heavily calcified plaque.  There is likely critical stenosis and near subtotal occlusion at the level of the mid right external iliac artery. Moderate  narrowing of the distal external iliac artery is also present just above the inguinal ligament approaching 70-75% narrowing. At the level of the right groin, postsurgical changes are seen likely related to prior femoral bypass with an occluded proximal bypass graft present. The native SFA is also occluded at its origin.   Diffuse disease of the left external iliac artery present without significant stenosis. The common femoral artery shows irregular plaque causing approximately 50% narrowing just below the inguinal ligament. Postsurgical changes are seen in the left groin with an open proximal bypass graft identified in the anterior thigh. The native SFA is occluded at its origin.  Nonvascular evaluation shows significant interval distension of small bowel loops which are diffusely dilated and fluid-filled. There may be some pneumatosis in the transverse duodenum and proximal jejunum as well, concerning for potential evolution of bowel ischemia to necrosis. The colon is completely decompressed. There is no evidence of free intraperitoneal air or focal abscess. The gallbladder shows some increased distention since the prior study. There is no evidence of overt biliary obstruction, however. Stable left adrenal mass identified. This is fairly low density and likely an incidental adenoma.  Review of the MIP images confirms the above findings.  IMPRESSION: 1. 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 2. 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 3. Heavily calcified but open inferior mesenteric artery. 4. 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 Postsurgical changes are evident in the right groin with occlusion of a previously placed proximal bypass graft. 5. Open left femoral bypass graft. 6. Significant interval distension of small bowel loops which are diffusely dilated and filled with fluid. There also may be a pneumatosis in the duodenum and proximal jejunum concerning for severe ischemia/necrosis. Surgical consultation would be recommended. Critical Value/emergent results were called by telephone at the time of interpretation on 07/21/2013 at  8:00 AM to Dr.Prashant Thedore Mins, who verbally acknowledged these results.   Electronically Signed   By: Irish Lack M.D.   On: 07/21/2013 08:35    Microbiology: Recent Results (from the past 240 hour(s))  CLOSTRIDIUM DIFFICILE BY PCR     Status: None   Collection Time    08/12/13 10:00 AM      Result Value Range Status   C difficile by pcr NEGATIVE  NEGATIVE Final     Labs: Basic Metabolic Panel:  Recent Labs Lab 08/07/13 0430 08/08/13 0620 08/09/13 0529 08/10/13 0830 08/12/13 0602 08/13/13 0017  NA 137 139 137 139 144 143  K 3.8 3.0* 3.4* 4.2 3.3* 3.3*  CL 95* 95* 98 101 105 105  CO2 35* 36* 34* 30 27 27   GLUCOSE 87 81 67* 67* 70 98  BUN 43* 39* 30* 29* 36* 35*  CREATININE 0.81 0.87 0.77 0.76 0.65 0.62  CALCIUM 8.0* 8.0* 7.8* 7.7* 7.8* 7.8*  MG 1.9  --   --   --   --   --   PHOS 3.1  --   --   --   --   --    Liver Function Tests:  Recent Labs Lab 08/09/13 0529 08/13/13 0017  AST 52* 38*  ALT 98* 40  ALKPHOS 226* 156*  BILITOT 1.8* 2.2*  PROT 5.0* 4.9*  ALBUMIN 1.9* 1.7*   No results found for this basename: LIPASE, AMYLASE,  in the last 168 hours No results found for  this basename: AMMONIA,  in the last 168 hours CBC:  Recent Labs Lab 08/08/13 1704 08/09/13 0529 08/11/13 0852 08/12/13 0602 08/13/13 0017  WBC 7.6 7.6 16.5* 17.8* 20.5*  NEUTROABS  --  5.5 14.6*  --   --   HGB 9.8* 9.7* 9.4* 8.8* 9.0*  HCT 29.8* 30.2* 28.5* 27.0* 27.5*  MCV 92.0 93.5 95.0 95.1 94.8  PLT 232 226 212 197 205   Cardiac Enzymes: No results found for this basename: CKTOTAL, CKMB, CKMBINDEX, TROPONINI,  in the last 168 hours BNP: BNP (last 3 results)  Recent Labs  12/23/12 2045 07/18/13 1424 08/03/13 0430  PROBNP 1188.0* 4170.0* 8721.0*   CBG:  Recent Labs Lab 08/13/13 0123 08/13/13 0546 08/13/13 1000 08/13/13 1225 08/13/13 1625  GLUCAP 96 82 85 88 85       Signed:  Karra Pink  [facility] 08/13/2013, 5:53 PM

## 2013-08-13 NOTE — Progress Notes (Signed)
Report given to receiving RN. Patient is stable with no verbal complaints and no signs or symptoms of distress or discomfort.  

## 2013-08-13 NOTE — Progress Notes (Signed)
Report given to receiving RN. DC Tele, DC IV. Discharge instructions discussed with patient's wife. Patient's wife denied any questions or concerns at this time. Patient is asleep and is comfortable. No signs or symptoms of distress or discomfort. Awaiting transportation at this current time.

## 2013-08-13 NOTE — Progress Notes (Signed)
PT Cancellation Note  Patient Details Name: [name] MRN: [id number] DOB: [birth year]   Cancelled Treatment:    Reason Eval/Treat Not Completed: Other (comment) Politely declining PT at this time;  Discussed pt at length with Junie Panning, RN, who stressed to me the importance of coordinating all aspects of care and keeping communication with family clear and consistent;  Palliative Care Team: Please advise [place] PT's role in Mr. Burruss Goals of Care;   If Mr. Liberatore values mobility, and PT is congruent with GOC, will be happy to continue  Thanks,  Van Clines, [place] [phone]     Van Clines [facility] 08/13/2013, 10:01 AM

## 2013-08-13 NOTE — Progress Notes (Signed)
SLP Cancellation Note  Patient Details Name: [name] MRN: [id number] DOB: [birth year]   Cancelled treatment:       Reason Eval/Treat Not Completed: Medical issues which prohibited therapy. Upon initial discussion with Dr. Catha Gosselin, she requested that speech attempt clinical swallowing evaluation due to concerns for malnutrition. Upon SLP arrival to the unit, RN Junie Panning reports that pt's abdomen is increasingly distended from previous day. MD notified and recommended SLP not proceed with PO trials. Per discussion with team, will sign off at this time given inability to consume PO intake from a medical standpoint. Please re-consult SLP services when pt is able to participate in PO trials. Please page Vernona Rieger at [phone] with any questions.   Maxcine Ham, M.A. CCC-SLP [phone]    Maxcine Ham 08/13/2013, 3:18 PM

## 2013-08-13 NOTE — Progress Notes (Signed)
Patient ID: QUIENTIN JENT, male   DOB: 09-01-1934, 78 y.o.   MRN: [id number] 15 Days Post-Op  Subjective: Pt denies abdominal pain, but clearly has abdominal pain on exam.  Denies any other BMs today.  Objective: Vital signs in last 24 hours: Temp:  [97.9 F (36.6 C)-98.3 F (36.8 C)] 98.3 F (36.8 C) (12/31 0616) Pulse Rate:  [88-104] 104 (12/31 0616) Resp:  [18] 18 (12/31 0616) BP: (84-105)/(43-52) 84/43 mmHg (12/31 0616) SpO2:  [90 %-94 %] 90 % (12/31 0616) Last BM Date: 08/12/13  Intake/Output from previous day: 12/30 0701 - 12/31 0700 In: 1412.5 [I.V.:1312.5; IV Piggyback:100] Out: 200 [Urine:200] Intake/Output this shift:    PE: Abd: soft, still diffusely tender, but greatest is throughout right side, hypoactive BS, some lower abdominal distention noted today.  Incision is healing well Heart: tachy  Lab Results:   Recent Labs  08/12/13 0602 08/13/13 0017  WBC 17.8* 20.5*  HGB 8.8* 9.0*  HCT 27.0* 27.5*  PLT 197 205   BMET  Recent Labs  08/12/13 0602 08/13/13 0017  NA 144 143  K 3.3* 3.3*  CL 105 105  CO2 27 27  GLUCOSE 70 98  BUN 36* 35*  CREATININE 0.65 0.62  CALCIUM 7.8* 7.8*   PT/INR No results found for this basename: LABPROT, INR,  in the last 72 hours CMP     Component Value Date/Time   NA 143 08/13/2013 0017   K 3.3* 08/13/2013 0017   CL 105 08/13/2013 0017   CO2 27 08/13/2013 0017   GLUCOSE 98 08/13/2013 0017   BUN 35* 08/13/2013 0017   CREATININE 0.62 08/13/2013 0017   CALCIUM 7.8* 08/13/2013 0017   PROT 4.9* 08/13/2013 0017   ALBUMIN 1.7* 08/13/2013 0017   AST 38* 08/13/2013 0017   ALT 40 08/13/2013 0017   ALKPHOS 156* 08/13/2013 0017   BILITOT 2.2* 08/13/2013 0017   GFRNONAA >90 08/13/2013 0017   GFRAA >90 08/13/2013 0017   Lipase     Component Value Date/Time   LIPASE 11 07/21/2013 1010       Studies/Results: Ct Abdomen Pelvis W Contrast  08/12/2013   CLINICAL DATA:  Abdominal pain for small bowel resection.  EXAM:  CT ABDOMEN AND PELVIS WITH CONTRAST  TECHNIQUE: Multidetector CT imaging of the abdomen and pelvis was performed using the standard protocol following bolus administration of intravenous contrast.  CONTRAST:  80 cc Omnipaque 300 intravenous  COMPARISON:  08/08/2013  FINDINGS: BODY WALL: No complicating factors related to recent midline laparotomy incision.  LOWER CHEST: Coronary artery atherosclerosis. Bulky mitral annular calcification. The left ventricular cavity appears dilated with subendocardial low-attenuation which is chronic, likely from previous infarct. Mildly decreased consolidative opacities in the right with lower lobes. Small bilateral pleural effusions, with unchanged mildly increased density on the right.  ABDOMEN/PELVIS:  Liver: No focal abnormality.  Biliary: Distended gallbladder with layering stones. There is right upper quadrant ascites but no definitive pericholecystic fat infiltration.  Pancreas: Atrophy without focal abnormality.  Spleen: Similar extent of wedge-shaped hypo enhancement in the peripheral spleen compatible with previous infarctions in this patient with high-grade celiac axis stenosis.  Adrenals: 2.5 cm heterogeneous mass in the left adrenal is relatively stable since 2009, most consistent with adenoma on previous non contrast imaging.  Kidneys and ureters: Bilateral renal hilar calcifications which are likely atherosclerotic. No hydronephrosis. Low attenuating foci in the renal cortex are simple or too small to characterize.  Bladder: Gas in the bladder.  No Foley catheter currently  present.  Bowel: Multiple enteroenterostomy shows no evidence of leakage. No abscess. As permitted by positive enteric contrast, No evidence of non enhancing bowel wall. No pneumatosis. Colon is diffusely distended, with fluid levels. Distal colonic diverticulosis. Nasogastric tube in good position.  Retroperitoneum: No mass or adenopathy.  Peritoneum: No free fluid or gas.  Vascular: Extensive  atherosclerosis. The degree of aortic branch vessels stenoses is better established on previous CT angiography. No definitive change from prior; proximal celiac and SMA stenosis is severe. Continued occlusion of the right superficial femoral artery and femoral graft. Probable left SFA occlusion. Surgical clips near the right hepatic artery which is irregular in luminal contour.  OSSEOUS: No acute abnormalities.  IMPRESSION: 1. Distended colon suggesting ileus. 2. No evidence of leak or abscess status post recent small bowel section. 3. Cholelithiasis with dilated gallbladder. Correlate right upper quadrant symptoms. If concern for acute cholecystitis, recommend sonography. 4. Pneumaturia. Correlate with urinalysis. 5. Partial clearing of lower lobe pneumonia.   Electronically Signed   By: Tiburcio Pea M.D.   On: 08/12/2013 09:34   US Abdomen Limited  08/12/2013   CLINICAL DATA:  Abdominal pain.  EXAM: US ABDOMEN LIMITED - RIGHT UPPER QUADRANT  COMPARISON:  None.  FINDINGS: Gallbladder  The gallbladder is distended measuring 4.5 cm in diameter. The gallbladder contains echogenic gallstones and sludge. There is no evidence of gallbladder wall thickening at 2.2 mm, nor pericholecystic fluid, nor a sonographic Murphy's sign.  Common bile duct  Diameter: 5.1 mm  Liver:  Limited evaluation the liver is grossly unremarkable. Evaluation is degraded by bowel gas.  IMPRESSION: Distended gallbladder containing sludge and gallstones without sonographic evidence of cholecystitis.   Electronically Signed   By: Salome Holmes M.D.   On: 08/12/2013 16:22    Anti-infectives: Anti-infectives   Start     Dose/Rate Route Frequency Ordered Stop   08/11/13 1800  ceFEPIme (MAXIPIME) 2 g in dextrose 5 % 50 mL IVPB     2 g 100 mL/hr over 30 Minutes Intravenous Every 12 hours 08/11/13 1713     07/29/13 2200  vancomycin (VANCOCIN) IVPB 750 mg/150 ml premix  Status:  Discontinued     750 mg 150 mL/hr over 60 Minutes  Intravenous Every 12 hours 07/29/13 1014 08/01/13 0941   07/25/13 1115  vancomycin (VANCOCIN) IVPB 1000 mg/200 mL premix     1,000 mg 200 mL/hr over 60 Minutes Intravenous To Surgery 07/25/13 1108 07/25/13 1111   07/22/13 2200  vancomycin (VANCOCIN) IVPB 1000 mg/200 mL premix  Status:  Discontinued     1,000 mg 200 mL/hr over 60 Minutes Intravenous Every 12 hours 07/22/13 2056 07/29/13 1014   07/21/13 1000  fluconazole (DIFLUCAN) IVPB 200 mg  Status:  Discontinued     200 mg 100 mL/hr over 60 Minutes Intravenous Every 24 hours 07/21/13 0944 08/04/13 0817   07/21/13 0945  metroNIDAZOLE (FLAGYL) IVPB 500 mg  Status:  Discontinued     500 mg 100 mL/hr over 60 Minutes Intravenous Every 8 hours 07/21/13 0933 07/25/13 0918   07/21/13 0945  piperacillin-tazobactam (ZOSYN) IVPB 3.375 g  Status:  Discontinued     3.375 g 12.5 mL/hr over 240 Minutes Intravenous 3 times per day 07/21/13 0944 08/04/13 0817   07/21/13 0800  vancomycin (VANCOCIN) IVPB 1000 mg/200 mL premix  Status:  Discontinued     1,000 mg 200 mL/hr over 60 Minutes Intravenous Every 12 hours 07/21/13 0626 07/22/13 1045   07/21/13 0615  ceFEPIme (MAXIPIME) 1 g  in dextrose 5 % 50 mL IVPB  Status:  Discontinued     1 g 100 mL/hr over 30 Minutes Intravenous 3 times per day 07/21/13 0613 07/21/13 0919       Assessment/Plan 1. S/p multiple ex laps for SBRs due to bowel ischemia  2. S/p right AKA  3. Horrible vascular disease  4. Increasing leukocytosis  5. Worsening abdominal pain, likely secondary to mesenteric ischemia  Plan: 1. Patient has some elevation in his LFTs, but in looking back, these have remained somewhat elevated after his shock liver from his original insult.  His [place] yesterday revealed gallstones, but no evidence of cholecystitis.  This is NOT the source of his abdominal pain.  He does NOT need a percutaneous cholecystostomy drain to be placed. I discussed this with he and his wife.  His wife became upset as she  interpreted the drain would be placed to dissolve his gallstones and make him better.  I had to inform her this was not the case and the drain is only necessary for patient's who have an acute infection of their gallbladder, which Mr. Plascencia does not. 2. I have discussed this with Dr. Catha Gosselin as well.  Dr. Derrell Lolling will see the patient as well today. 3. Unfortunately, there is little else to offer this patient except for comfort care and to control his pain with medications.  I still suspect, given his severe vascular disease, that he is having mesenteric ischemia.  There are no ways to correct this problem.  LOS: 26 days    Sholonda Jobst E 08/13/2013, 7:53 AM Pager: [phone]

## 2013-08-13 NOTE — Progress Notes (Signed)
Per family (daughter and grandson) has stated that they have been giving him sips of water and sweet tea yesterday and today. MD notified and is aware.

## 2013-08-16 NOTE — Consult Note (Signed)
I have reviewed and discussed the care of this patient in detail with the nurse practitioner including pertinent patient records, physical exam findings and data. I agree with details of this encounter.  

## 2013-08-21 ENCOUNTER — Telehealth: Payer: Self-pay

## 2013-08-21 NOTE — Telephone Encounter (Signed)
Olegario Mess[facility]Kathy [facility] is also needing to know if you would like for their physician to assist with symptom mgt.  Please advise.  CB# [phone]

## 2013-08-21 NOTE — Telephone Encounter (Signed)
[facility] called and wanted to clarify if Dr.John would be the patient's "attending provider" after discharge.  The nurse stated the patient would be discharged today.  I told the nurse the patient was a patient of Dr.John's and he did have an upcoming appointment on the 20th, however, she stated there was a difference in being a "attending" provider.  Unfortunately, she did not leave a name or number.

## 2013-08-21 NOTE — Telephone Encounter (Signed)
Yes, ok for hospice MD for symptoms management

## 2013-08-21 NOTE — Telephone Encounter (Signed)
Please advise ASAP if possible.

## 2013-08-21 NOTE — Telephone Encounter (Signed)
Yes to attending provider

## 2013-09-02 ENCOUNTER — Telehealth: Payer: Self-pay

## 2013-09-02 ENCOUNTER — Ambulatory Visit: Payer: Medicare Other | Admitting: Internal Medicine

## 2013-09-02 NOTE — Telephone Encounter (Signed)
Patient past away @ Home per Obituary in [place] [employer] °

## 2013-09-11 IMAGING — CR DG CHEST 1V PORT
1 series · 1 of 1 positions shown · non-contrast
Comparison: 05/14/2012 and earlier.

CLINICAL DATA: 78-year-old male with chest and epigastric pain.

PORTABLE CHEST - 1 VIEW

[AP]
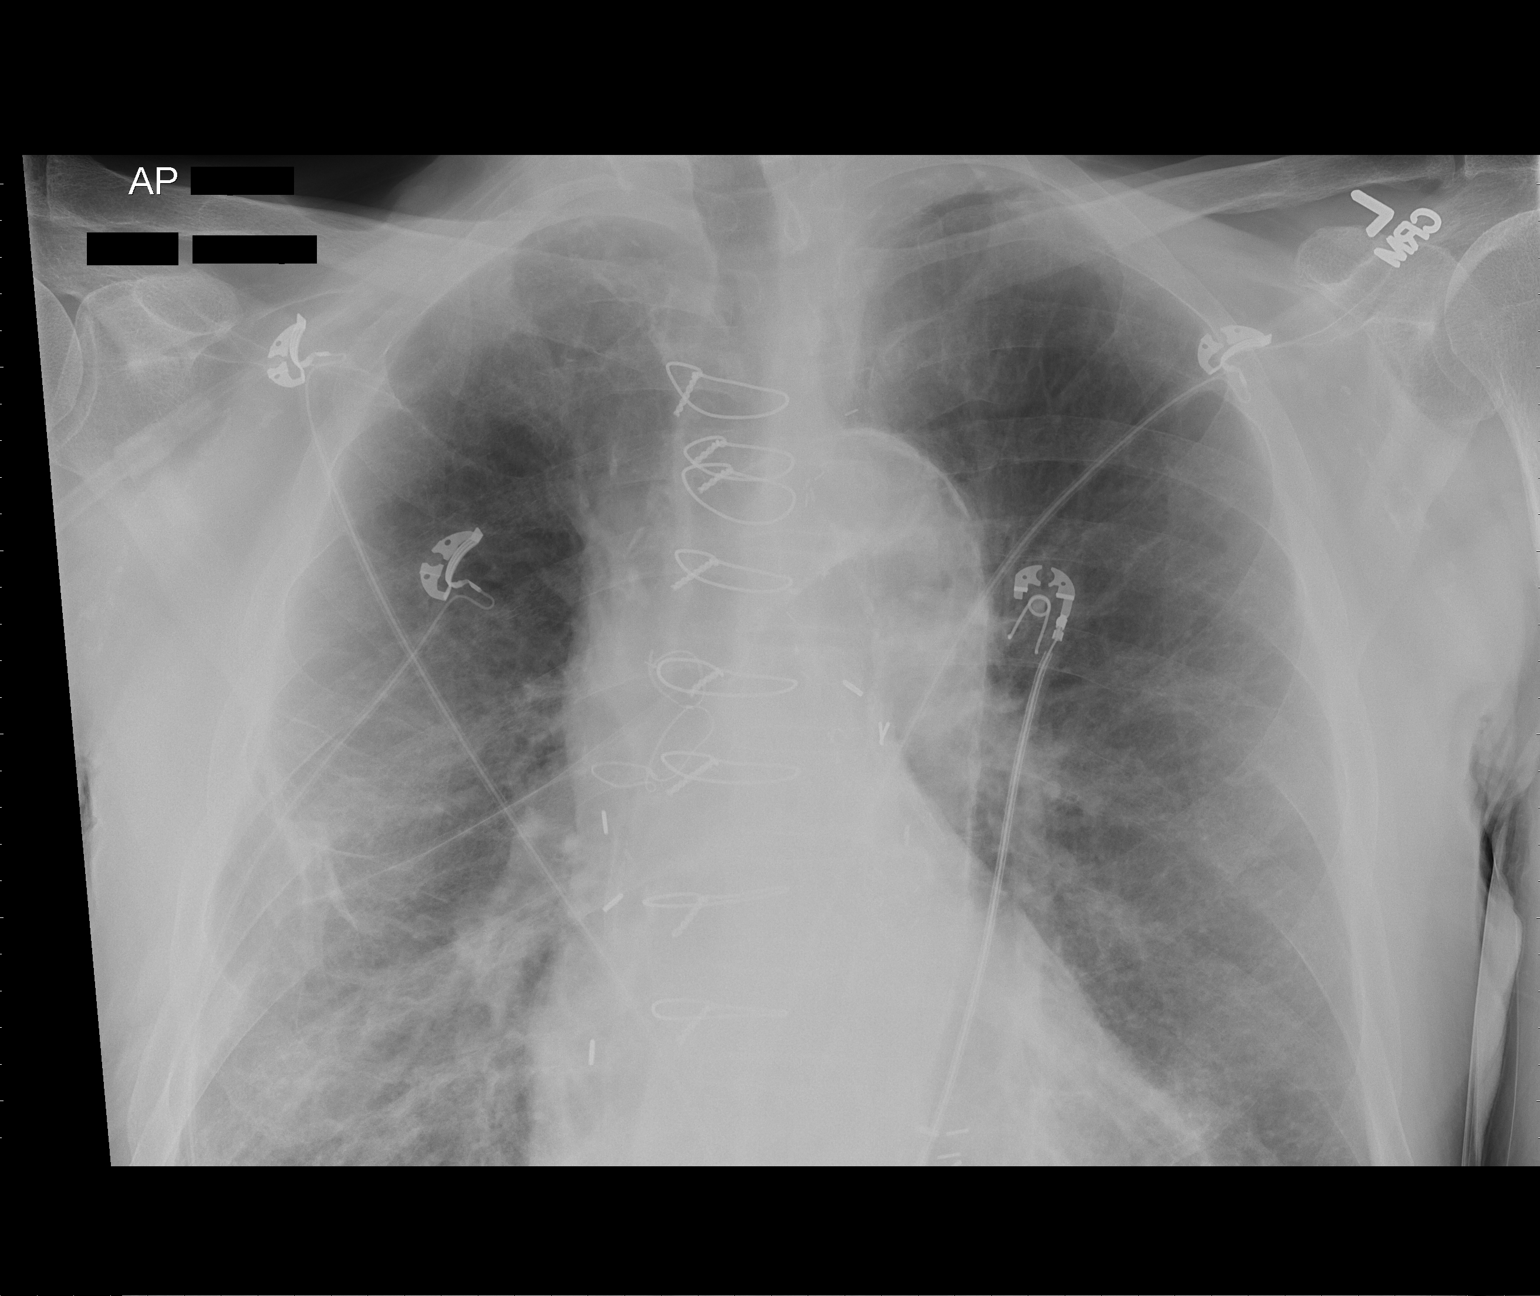

[1 of 1 positions shown; findings below may reference images not displayed]

FINDINGS: AP portable semi upright view 1110 hours.  Stable lung
volumes.  Stable cardiac size and mediastinal contours.  Stable
sequelae of CABG.  No pneumothorax.  Chronic increased interstitial
markings asymmetrically greater on the right again noted.  No
pleural effusion.  Mildly increased confluent opacity on the right.
Otherwise no acute pulmonary opacity.
IMPRESSION: Chronic lung disease. A small area of mild increased confluence of
opacity on the right suspicious for a right lung infectious
exacerbation.

## 2013-09-11 IMAGING — CT CT ANGIO CHEST
1 of 5 series · 18 of 36 positions shown · IV contrast (APPLIED)
Comparison: Portable chest radiograph from the same day and
earlier.
CT abdomen 07/23/2008.

CLINICAL DATA: 78-year-old male with chest pain and pressure.

CT ANGIOGRAPHY CHEST
TECHNIQUE: Multidetector CT imaging of the chest using the
standard protocol during bolus administration of intravenous
contrast. Multiplanar reconstructed images including MIPs were
obtained and reviewed to evaluate the vascular anatomy.
Contrast: 80mL OMNIPAQUE IOHEXOL 350 MG/ML SOLN

[Series 6: pulm embolism 1.0 b25f st · axial · 0.70mm/px · z∈[-330,-42]mm · 18 of 320 slices shown]
[im 16/320  lung]
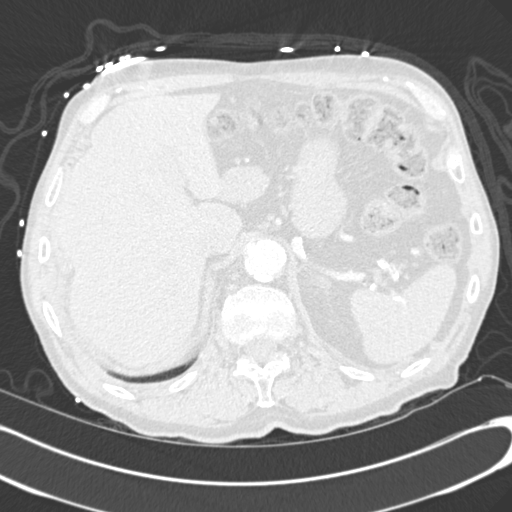
[im 32/320  mediastinal]
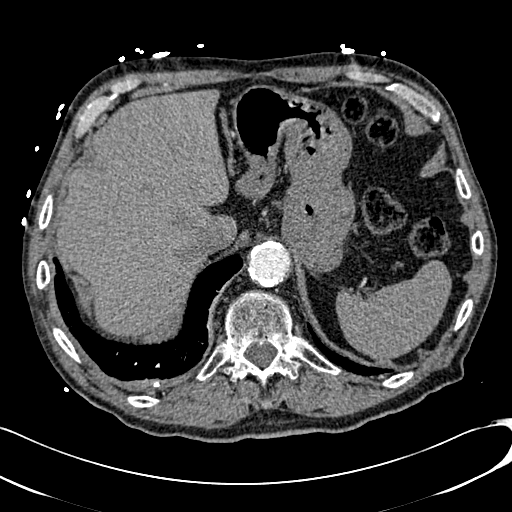
[im 48/320  lung]
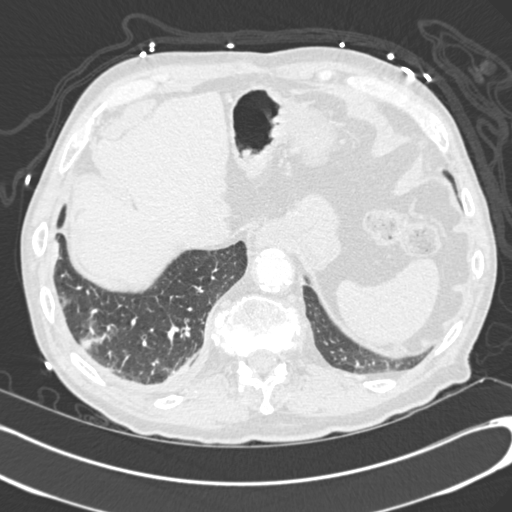
[im 64/320  mediastinal]
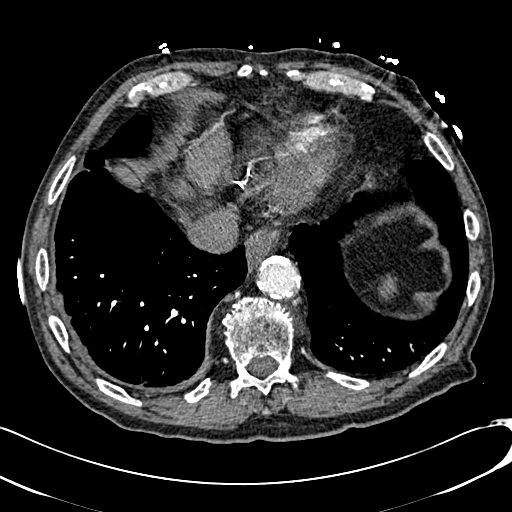
[im 80/320  lung]
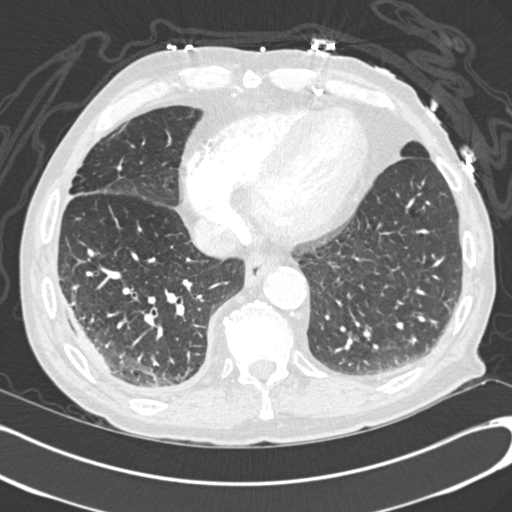
[im 96/320  mediastinal]
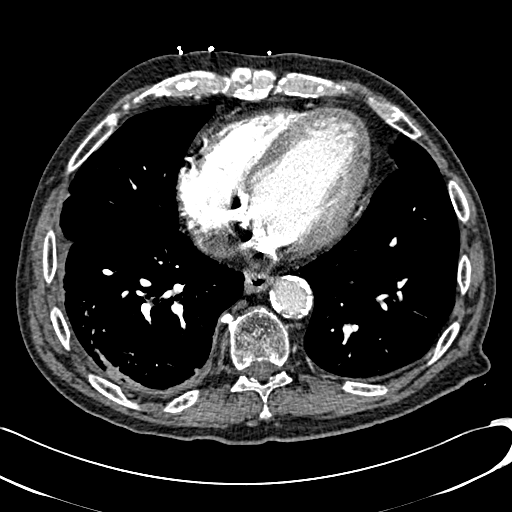
[im 112/320  lung]
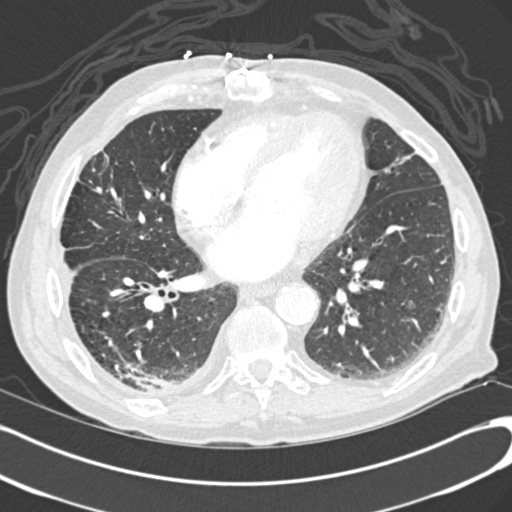
[im 128/320  mediastinal]
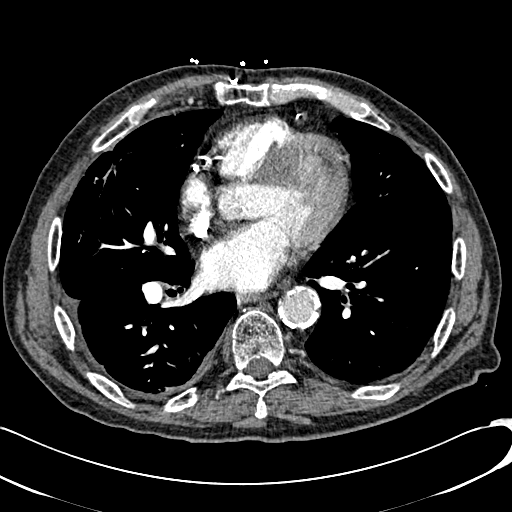
[im 144/320  lung]
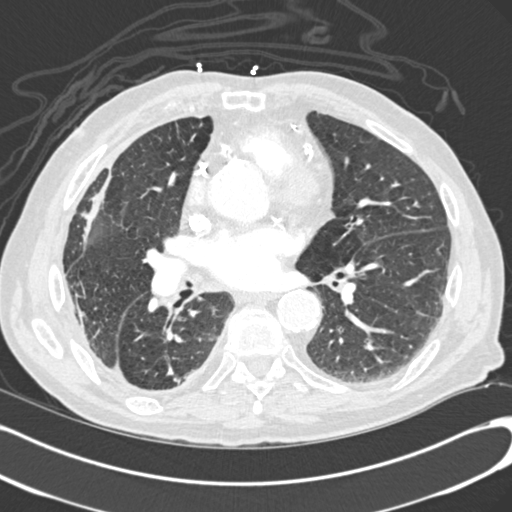
[im 176/320  mediastinal]
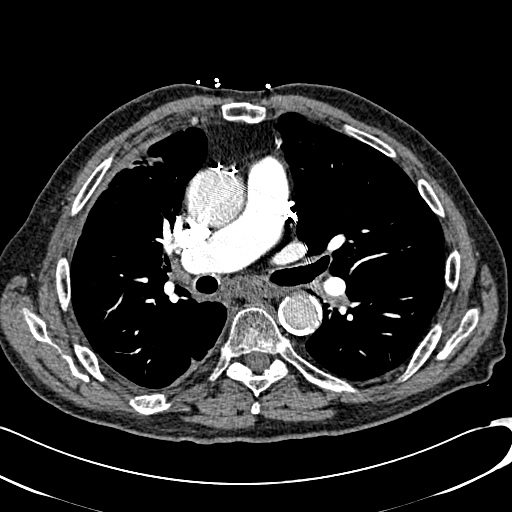
[im 192/320  lung]
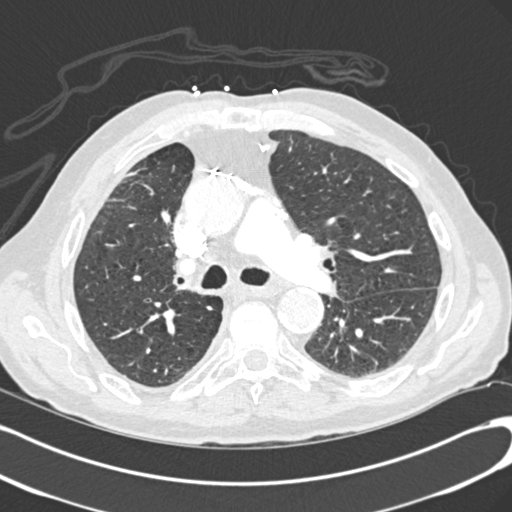
[im 208/320  mediastinal]
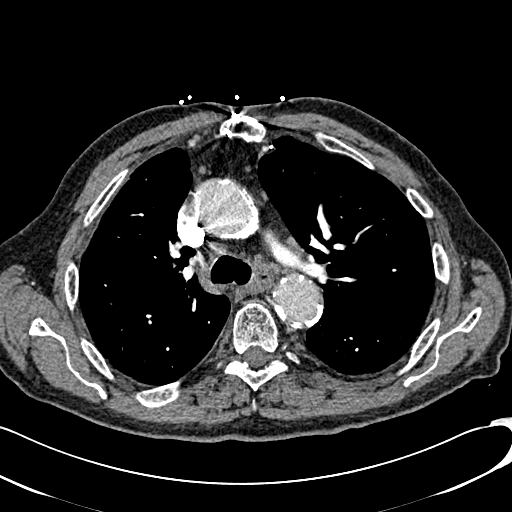
[im 224/320  lung]
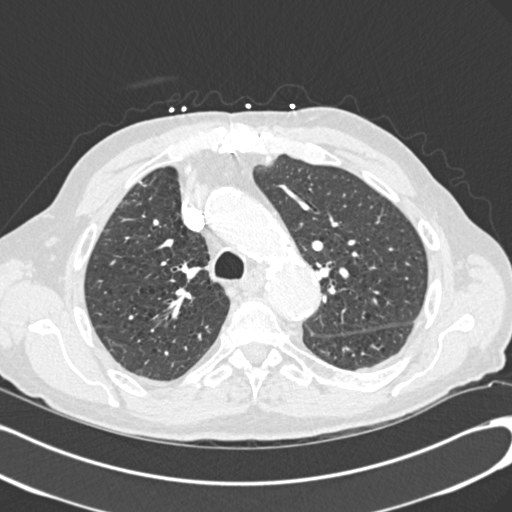
[im 240/320  mediastinal]
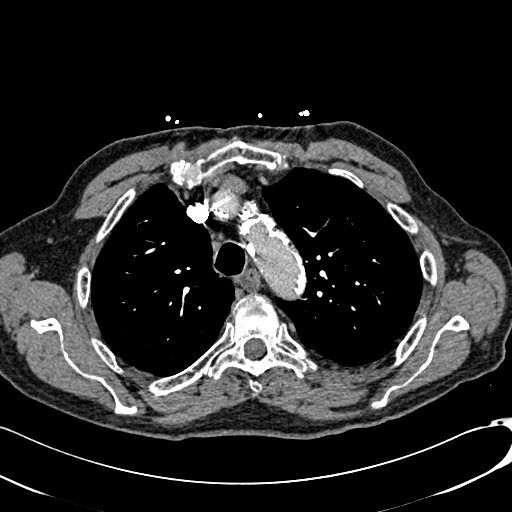
[im 256/320  lung]
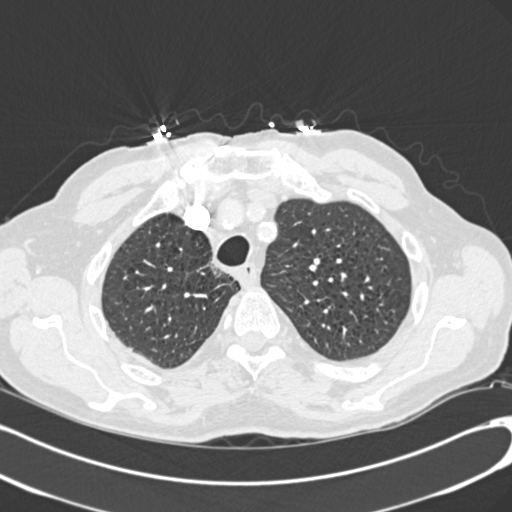
[im 272/320  mediastinal]
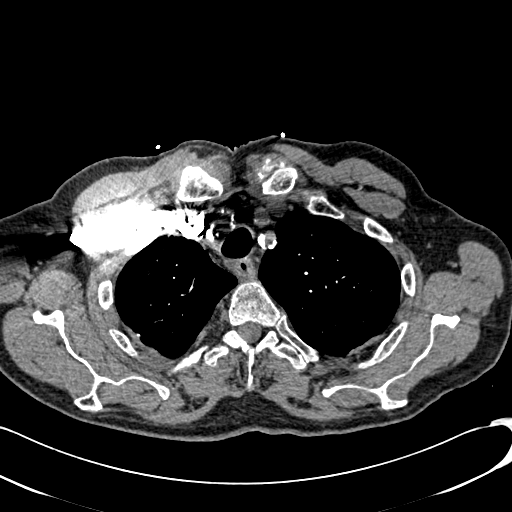
[im 288/320  lung]
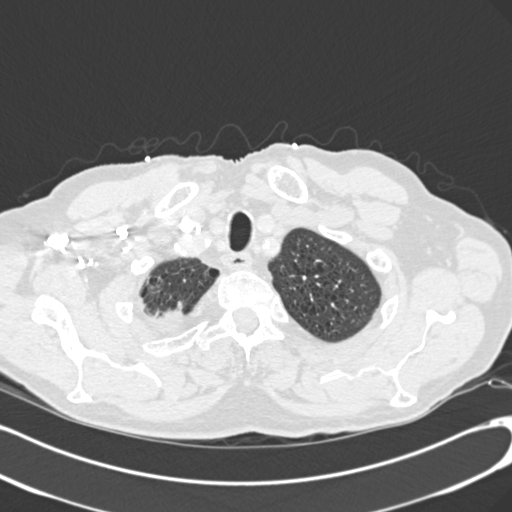
[im 304/320  mediastinal]
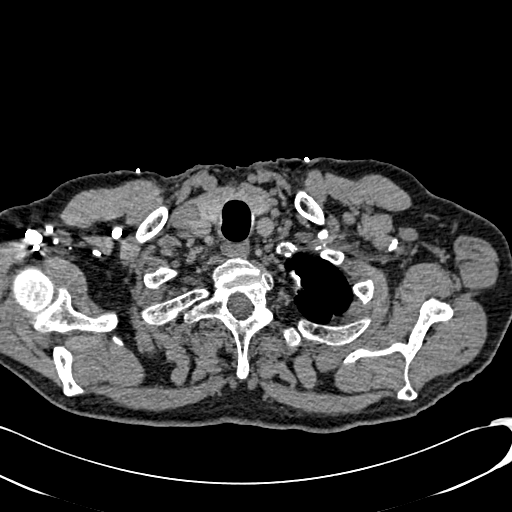

[18 of 36 positions shown; findings below may reference images not displayed]

FINDINGS: Good contrast bolus timing in the pulmonary arterial
tree.  No focal filling defect identified in the pulmonary arterial
tree to suggest the presence of acute pulmonary embolism.

Severe widespread atherosclerosis, mostly calcified plaque.
Diffuse involvement of the aorta and great vessels.  Coronary
artery involvement.  No mediastinal lymphadenopathy.  Negative
visualized thyroid.

Previous median sternotomy. No acute osseous abnormality
identified.

Major airways are patent.  Widespread peribronchial thickening.
Mild upper lobe bronchiectasis.  Mild to moderate upper lobe
predominant emphysema.  In the right hemithorax there is pleural
thickening and/or trace pleural effusion.  Some small calcified
pleural plaques are noted.  None of the areas of pleural thickening
are mass-like.  There is associated subpleural scarring and
reticular opacity.  There are more confluent subpleural and streaky
areas of pulmonary opacity.  None of these parenchymal foci are
mass like. The findings at the right lung base have progressed
since 1669.

Superimposed dependent atelectasis in the right lung.  In the left
lung there is also dependent atelectasis.  There is mild scarring
in the lingula.

Negative visualized right upper quadrant abdominal viscera.
Chronic left adrenal mass measures approximately 20 mm and appears
not significantly changed since 1669.  This has densitometry of
less than 10 HU today (8 HU).
IMPRESSION: 1. No evidence of acute pulmonary embolus.
2.  Chronic lung disease with widespread right lung pleural
thickening, progressed at the lung base since the 1669 CT abdomen.
Associated scattered subpleural and confluent scarring.   No areas
suspicious for acute lung infection.

No focal suspicious or mass-like area identified in the right lung,
but imaging surveillance (such as periodic chest radiographs) of
this patient's lungs would be prudent.
3. Advanced widespread atherosclerosis.
4.  Chronic benign appearing left adrenal mass.  Favor adenoma.

## 2013-09-14 DEATH — deceased

## 2013-09-18 IMAGING — CR DG CHEST 1V PORT
1 series · 1 of 1 positions shown · non-contrast
Comparison: 12/16/2012 CT

CLINICAL DATA: Chest pain, shortness of breath

PORTABLE CHEST - 1 VIEW

[AP]
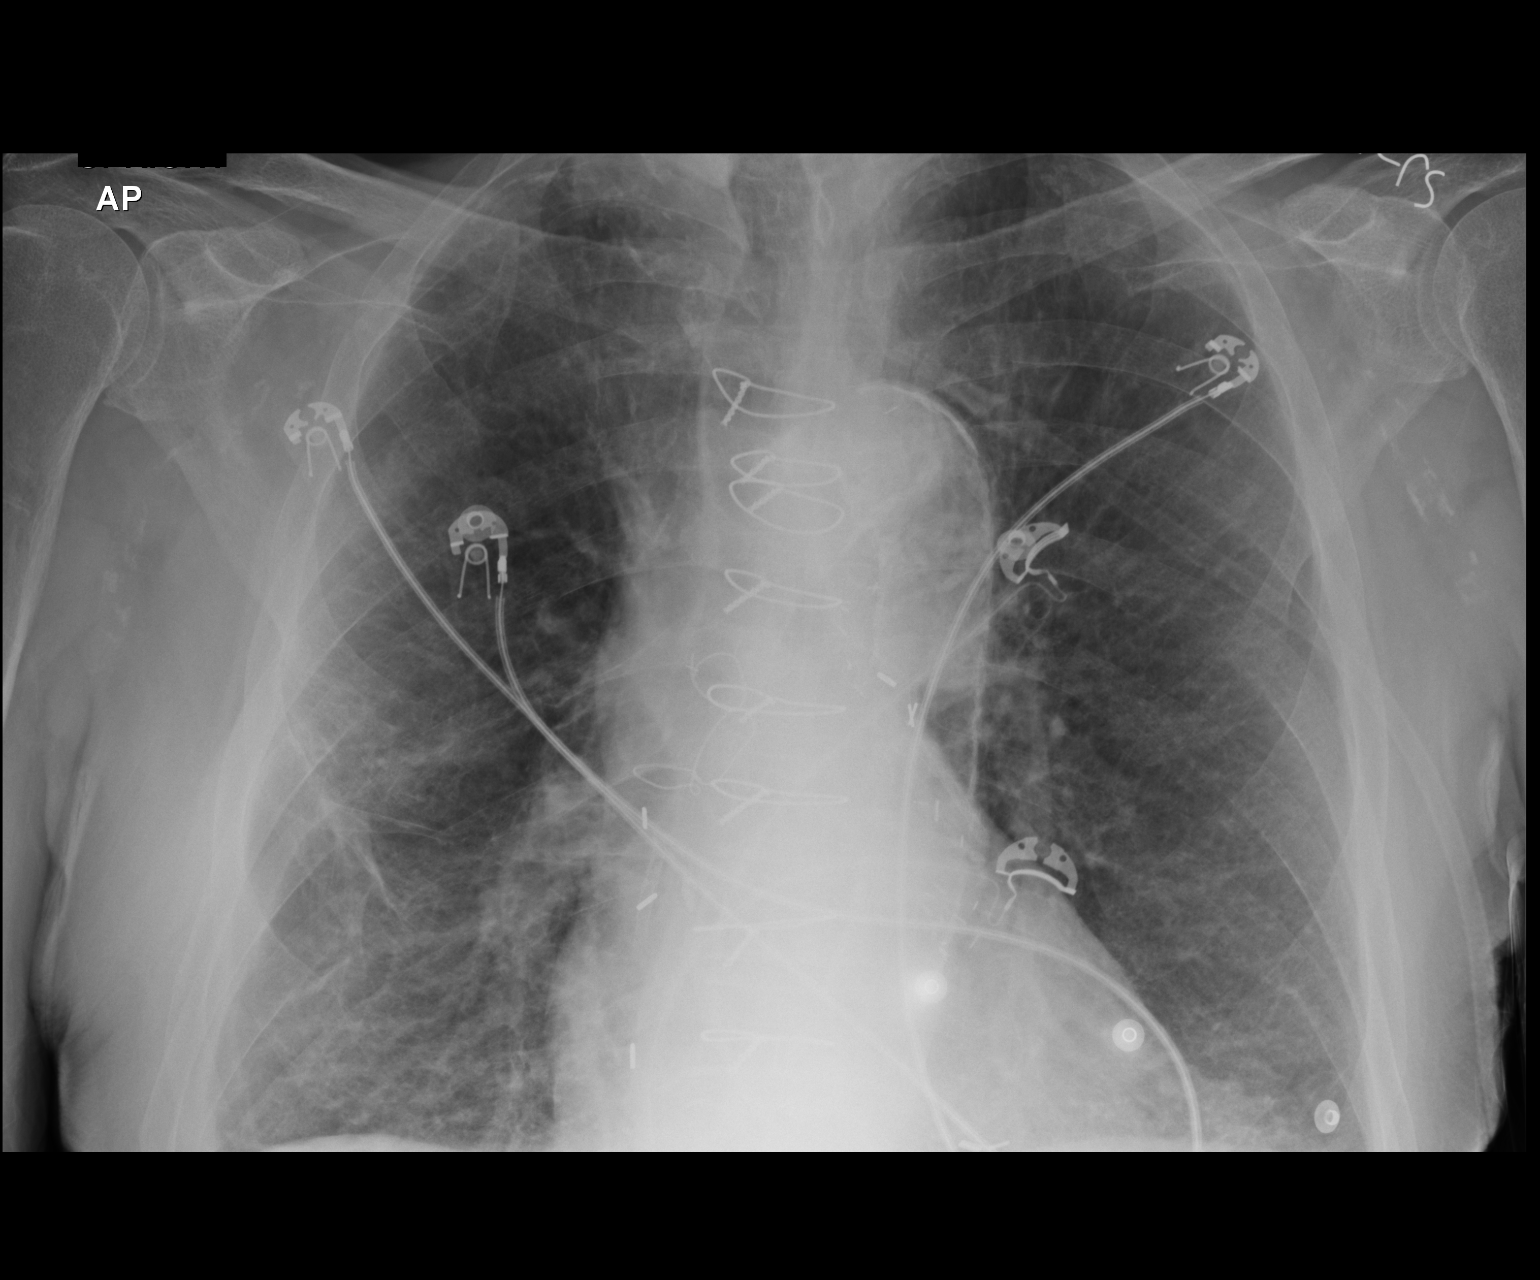

[1 of 1 positions shown; findings below may reference images not displayed]

FINDINGS: Aortic prominence and tortuosity with advanced
atherosclerosis.  Status post median sternotomy and CABG.  Linear
opacities periphery right mid and upper lung, correspond to
scarring on recent CT.  Mild left lung base opacities; atelectasis
versus infiltrate.  Heart size upper normal to mildly enlarged.
Diffuse osteopenia.
IMPRESSION: Mild left lung base opacity; atelectasis versus infiltrate.

Chronic changes are similar to prior.

## 2014-03-13 IMAGING — CT CT ABD-PELV W/O CM
2 of 4 series · 16 of 46 positions shown, 18 images · non-contrast
Comparison: CT of the abdomen and pelvis July 23, 2008.

CLINICAL DATA: Abdominal pain, weakness and confusion. Diarrhea.

EXAM:
CT ABDOMEN AND PELVIS WITHOUT CONTRAST
TECHNIQUE: Multidetector CT imaging of the abdomen and pelvis was performed
following the standard protocol without intravenous contrast.

[Series 2: abd/ pelvis 5.0 i30f 1 · axial · 0.76mm/px · z∈[-544,-134]mm · 13 of 90 slices shown, 15 images]
[im 4/90  soft-tissue]
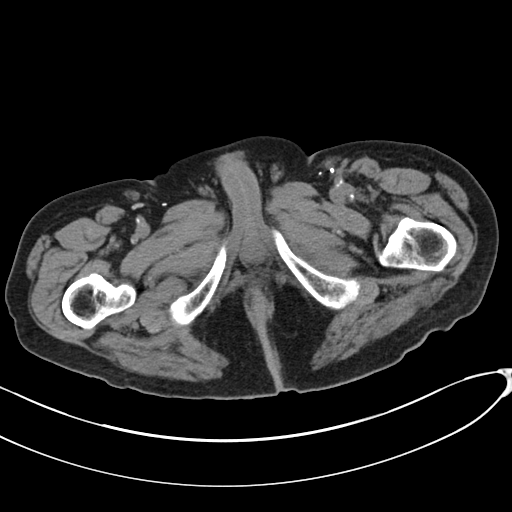
[im 4/90  bone]
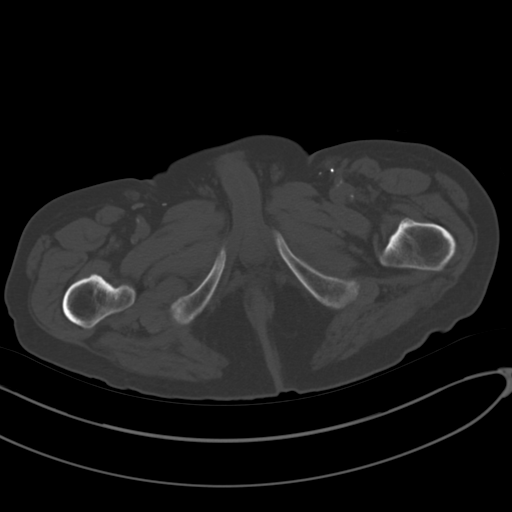
[im 12/90  soft-tissue]
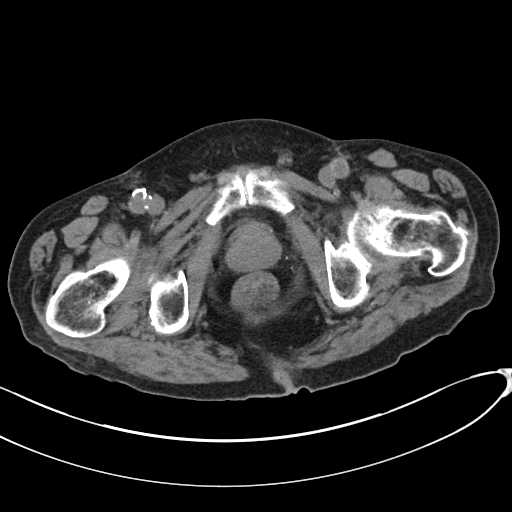
[im 19/90  soft-tissue]
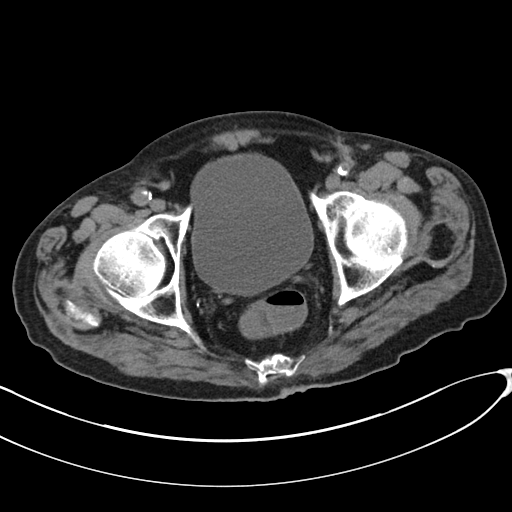
[im 26/90  soft-tissue]
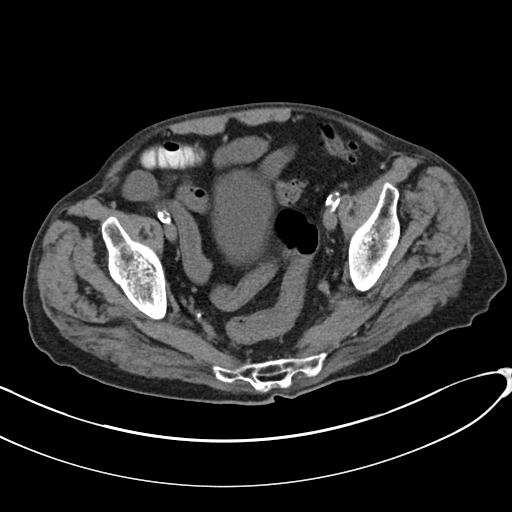
[im 30/90  soft-tissue]
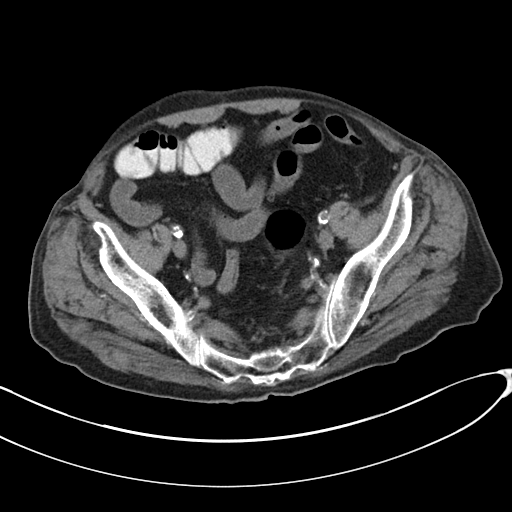
[im 38/90  soft-tissue]
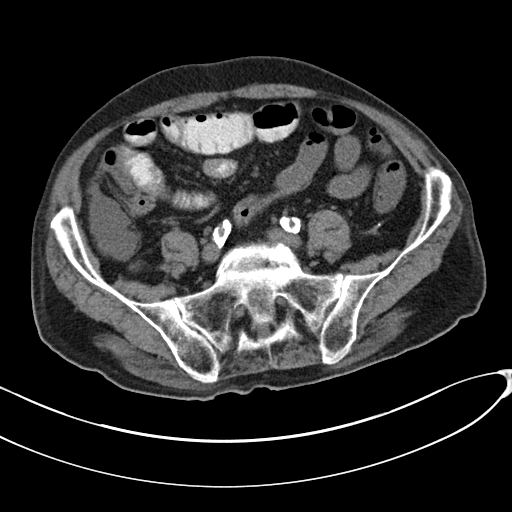
[im 45/90  soft-tissue]
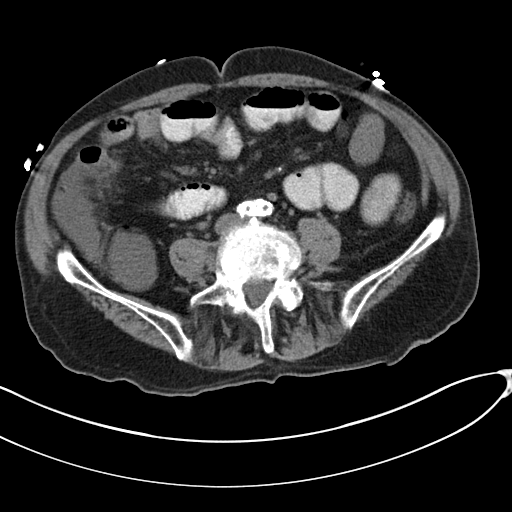
[im 52/90  soft-tissue]
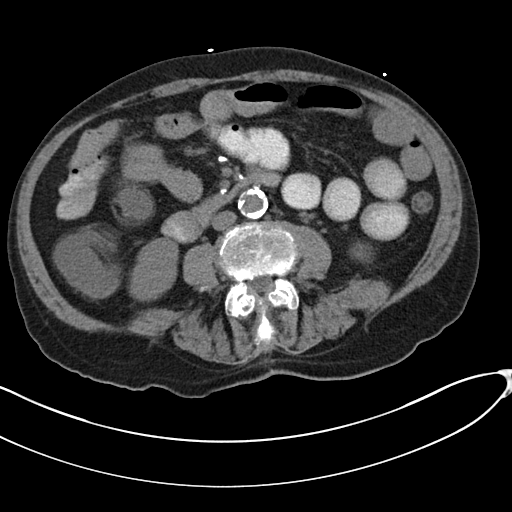
[im 60/90  soft-tissue]
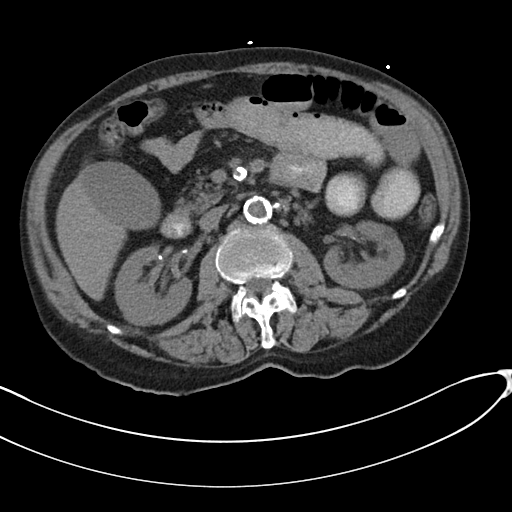
[im 60/90  bone]
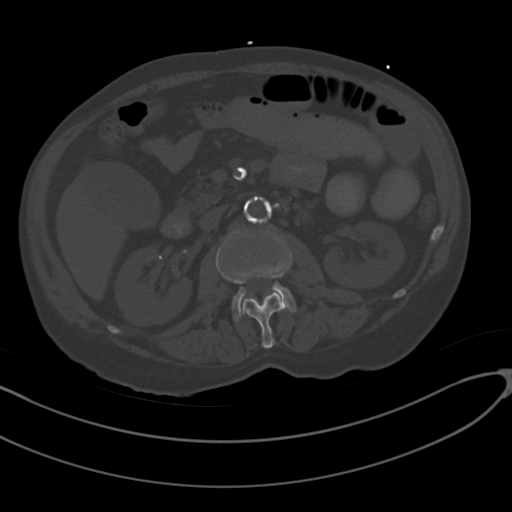
[im 64/90  soft-tissue]
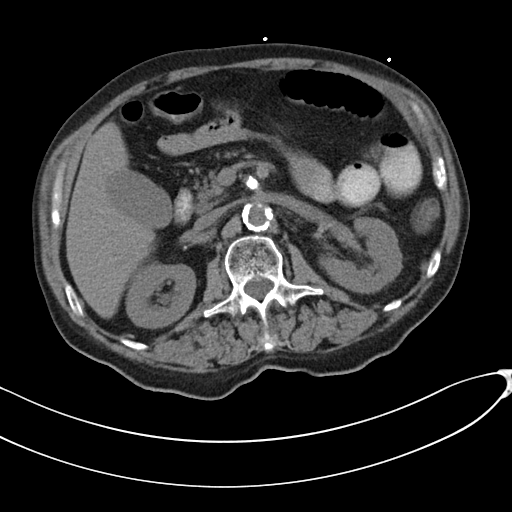
[im 71/90  soft-tissue]
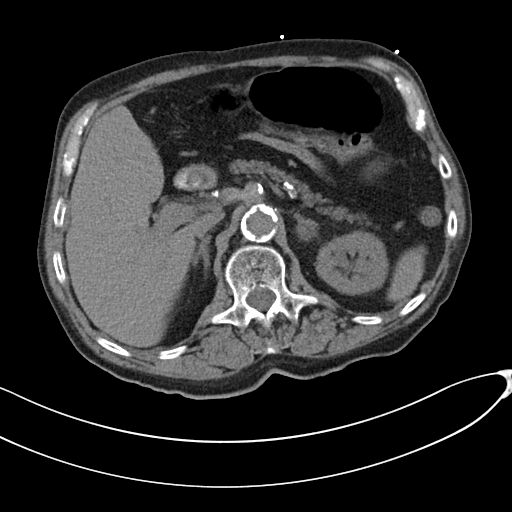
[im 78/90  soft-tissue]
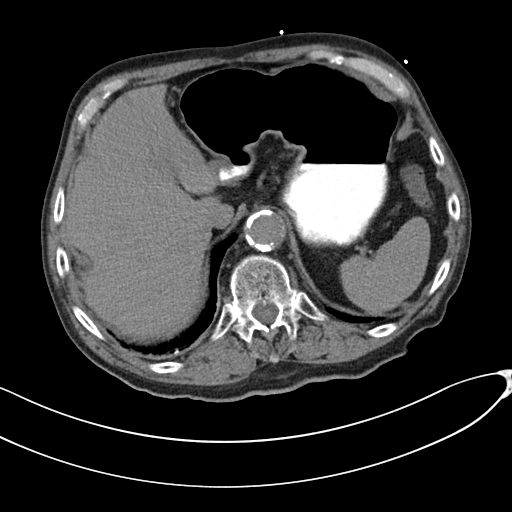
[im 86/90  soft-tissue]
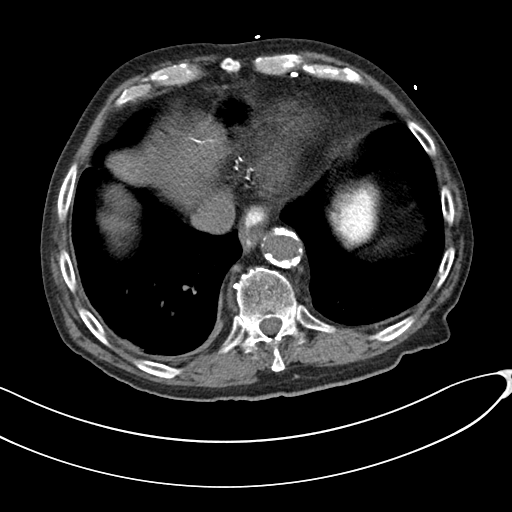

[Series 5: cor st · coronal · 0.95mm/px · 3 of 101 slices shown]
[im 34/101  soft-tissue]
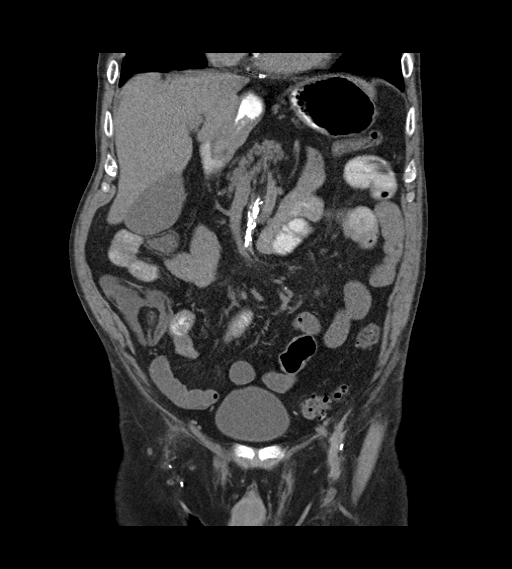
[im 45/101  soft-tissue]
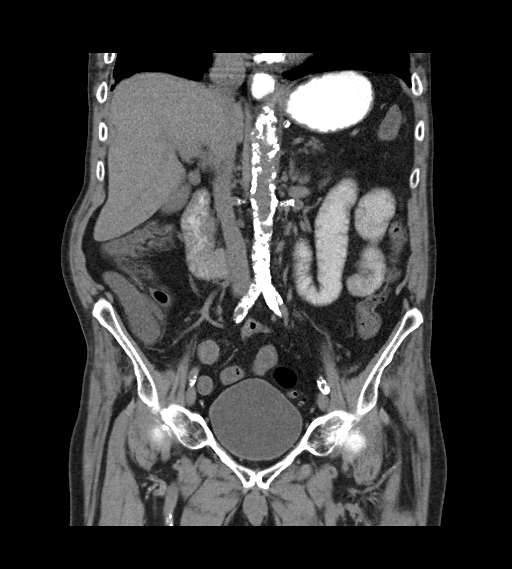
[im 56/101  soft-tissue]
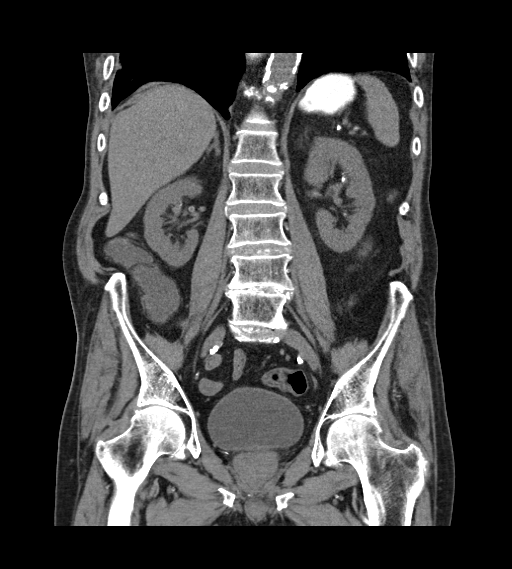

[16 of 46 positions shown; findings below may reference images not displayed]

FINDINGS: Limited view of the lung bases demonstrated dependent atelectasis,
minimal pleural thickening. Dense mitral annular calcifications,
status post median sternotomy with cardiac lead in place.

CONTRAST within the included thoracic esophagus and stomach.
Multiple loops of prominent small bowel in the measure up to 3.7 cm,
without discrete transition point. Fluid filled cecum, large bowel
is normal in course and caliber. No pericolonic inflammatory
changes. Sigmoid diverticulosis

Liver, spleen, right adrenal gland are unremarkable. Relatively
fatty replaced pancreas is otherwise unremarkable. Punctate layering
gallstones within the gallbladder. 24 x 19 mm left adrenal nodule, 5
Hounsfield units, consistent with benign adenoma. No intraperitoneal
free fluid nor free air.

Kidneys are well located. 3 mm right interpolar calculus was present
previously, bilateral vascular calcifications. No new
hydronephrosis. Limited assessment for renal masses on this
noncontrast examination.

Severe calcific atherosclerosis aorta, with apparent bilateral renal
artery stents. Bilateral common iliac artery stents. Urinary bladder
is well distended unremarkable. Prostate is nonsuspicious. Patient
is osteopenic. Severe L4-5 degenerative disc disease with minimal
neural foraminal narrowing.
IMPRESSION: Multiple loops of mildly prominent small bowel throughout the
abdomen, with no discrete transition point, this could reflect
enteritis with fluid filled cecum. No bowel obstruction. Sigmoid
diverticulosis without acute diverticulitis.

Cholelithiasis without CT findings of acute cholecystitis.

Severe calcific atherosclerosis of the aorta with bilateral renal
and common iliac artery stents.

  By: Bongimpilo Disane

## 2014-03-15 IMAGING — CR DG CHEST 1V PORT
1 series · 1 of 1 positions shown · non-contrast
Comparison: December 23, 2012.

CLINICAL DATA: Status post intubation of the trachea

EXAM:
PORTABLE CHEST - 1 VIEW

[AP]
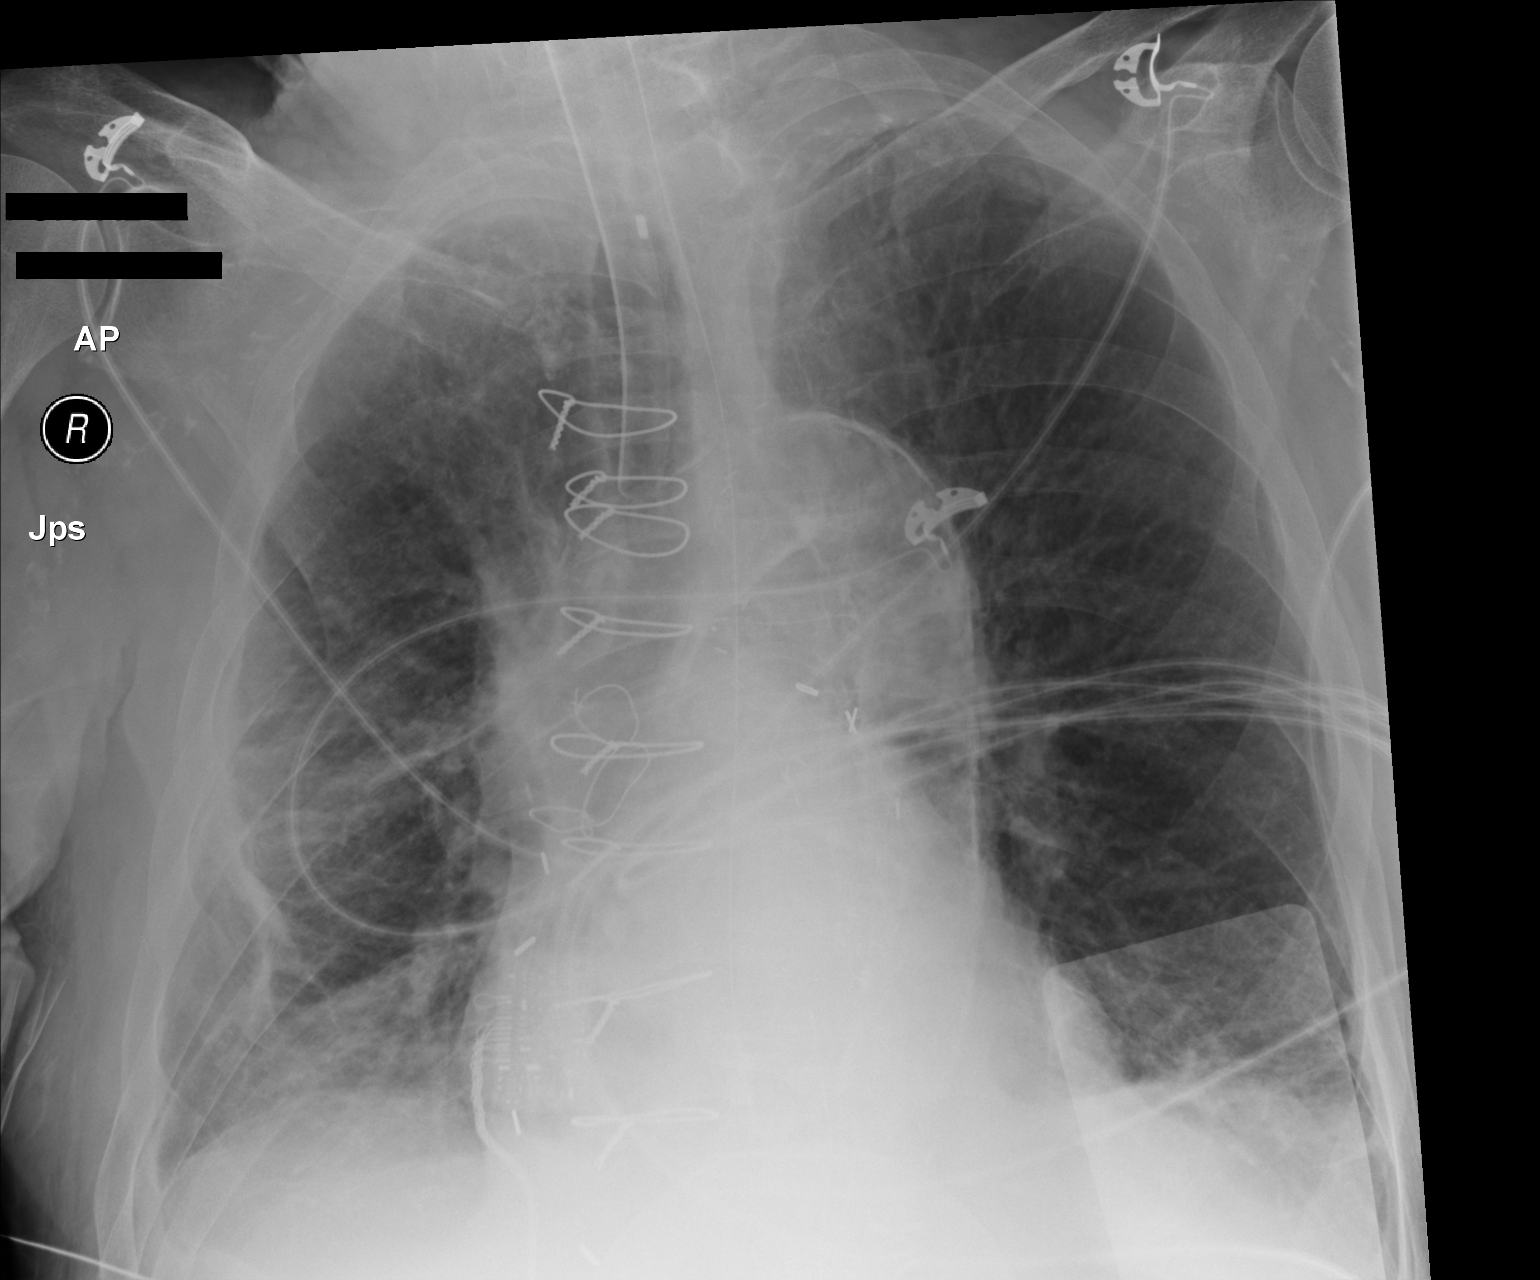

[1 of 1 positions shown; findings below may reference images not displayed]

FINDINGS: The endotracheal tube tip lies approximately 2 cm below the inferior
margin of the clavicular heads and approximately 4 cm above the
carina. There is an esophagogastric tube present whose tip is not
clearly below the hemidiaphragms. It may have coiled upon itself.
Given that there is a subtle linear density projecting over the
lower heart border.

The left lung appears mildly hyperinflated. On the right. Inflation
is not quite is grade in their coarse lung markings suggesting
fibrosis or atelectasis. The cardiac silhouette is not enlarged. The
pulmonary vascularity is not engorged. There is calcification in the
wall of the tortuous ascending and descending thoracic aorta.
IMPRESSION: 1. The endotracheal tube tip appears to be in reasonable position
approximately 4 cm above the Carina.

2. Positioning of the esophagogastric tube is questionable. A lower
thoracic, upper abdominal film would be useful to assure that the
tip and proximal port of the nasogastric tube lie below the level of
the GE junction.

3. Increased interstitial density at the right lung base is in part
chronic but superimposed acute atelectasis or early interstitial
pneumonia is not excluded.

4.  There is no evidence of CHF.

## 2014-03-15 IMAGING — CR DG CHEST 1V PORT
2 series · 2 of 2 positions shown · non-contrast
Comparison: 06/19/2013

CLINICAL DATA: Status post central line placement.

EXAM:
PORTABLE CHEST - 1 VIEW

[AP (1 of 2)]
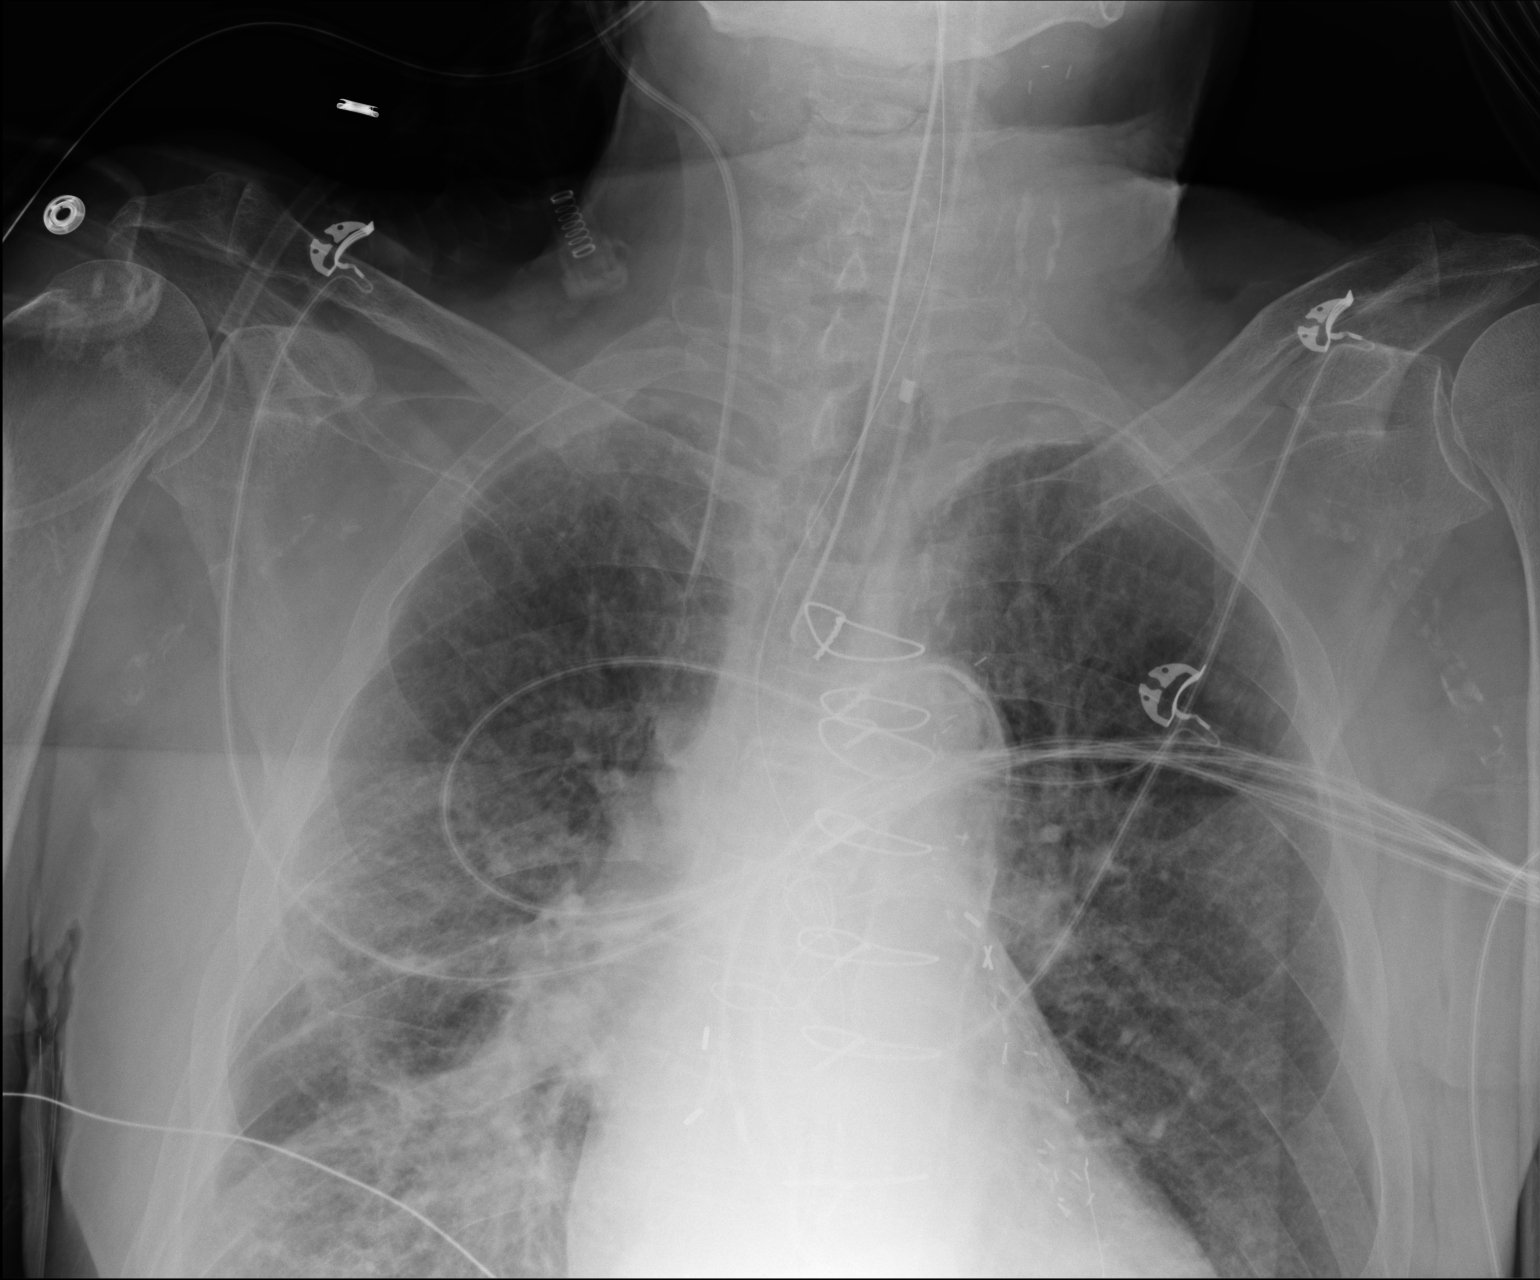

[AP (2 of 2)]
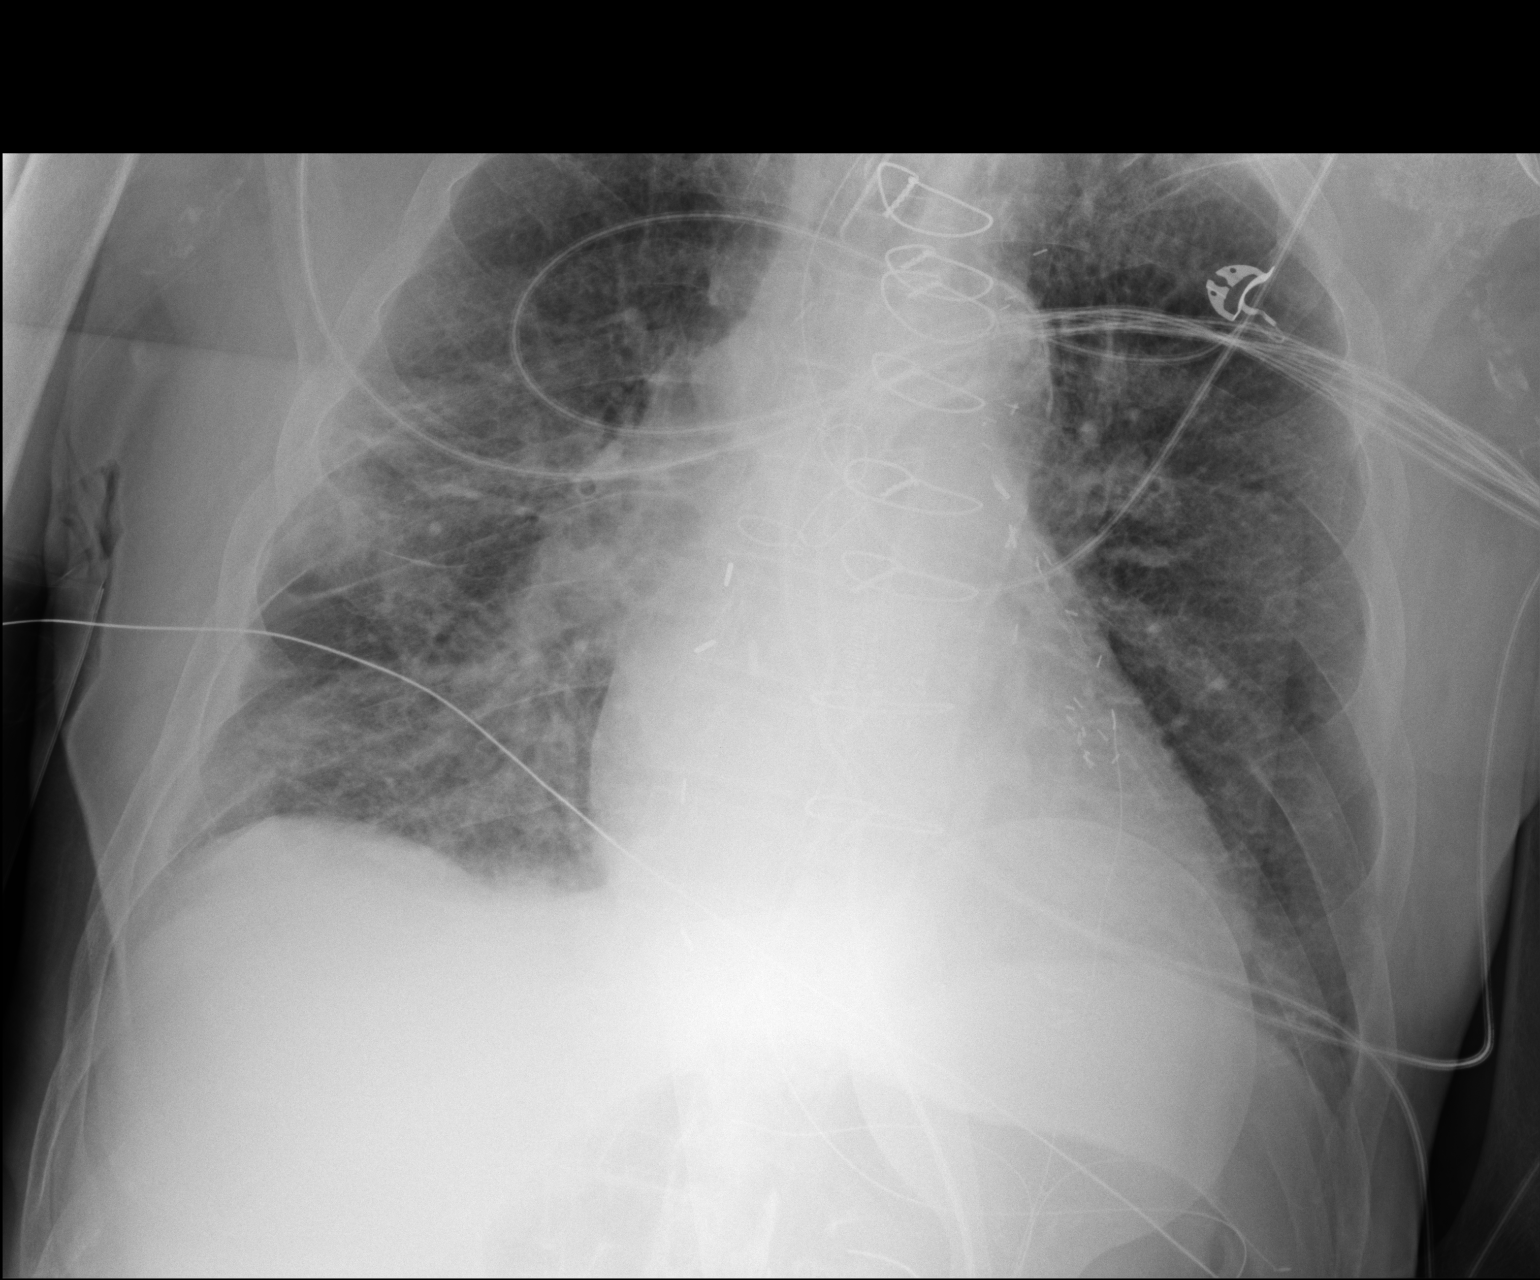

[2 of 2 positions shown; findings below may reference images not displayed]

FINDINGS: New right internal jugular central venous line has its tip in the
upper aspect of the superior vena cava. No pneumothorax.

Endotracheal tube and nasogastric to are stable in well positioned.

Bilateral irregular interstitial thickening with right mid and lower
lung zone hazy airspace opacity is stable. Changes from cardiac
surgery are stable.
IMPRESSION: Right internal jugular central venous line has its tip in the upper
aspect of the superior vena cava. No pneumothorax.

No other change from the earlier exam.

## 2014-03-16 IMAGING — CR DG CHEST 1V PORT
2 series · 2 of 2 positions shown · non-contrast
Comparison: Prior radiograph from 06/19/2013

CLINICAL DATA: Atelectasis

EXAM:
PORTABLE CHEST - 1 VIEW

[AP (1 of 2)]
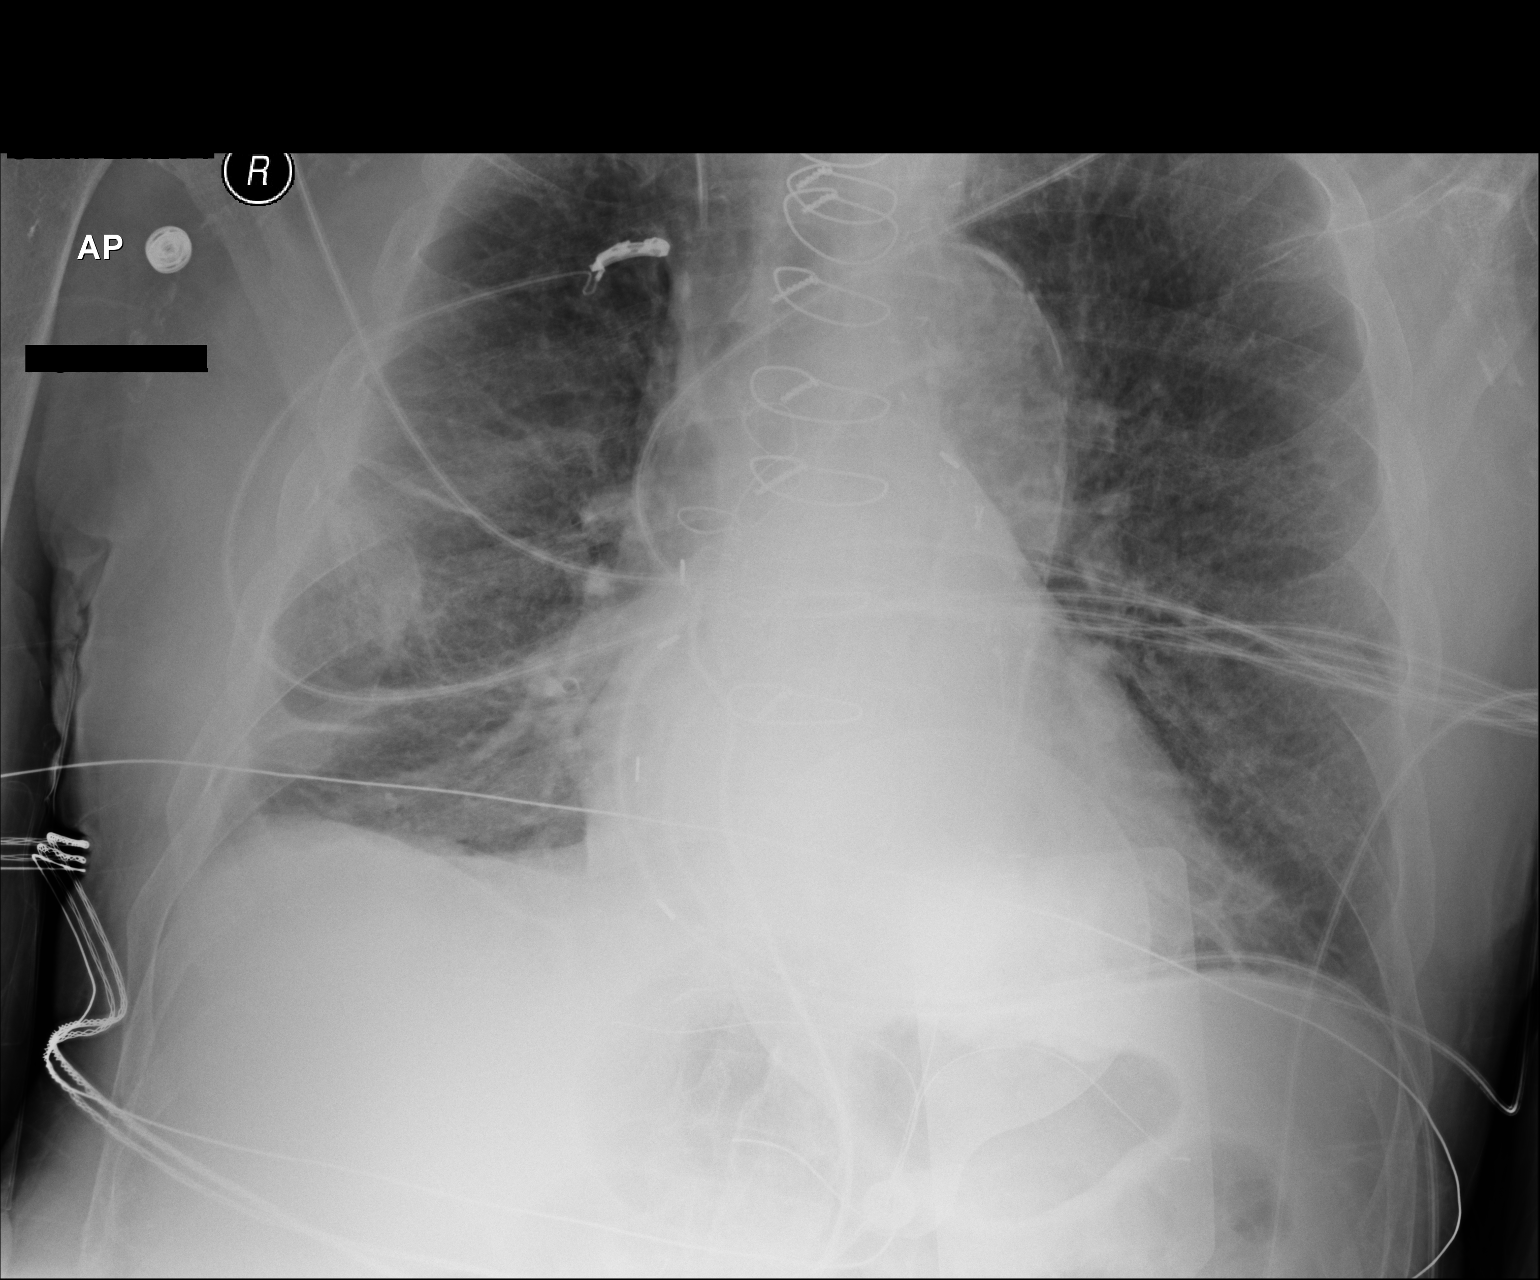

[AP (2 of 2)]
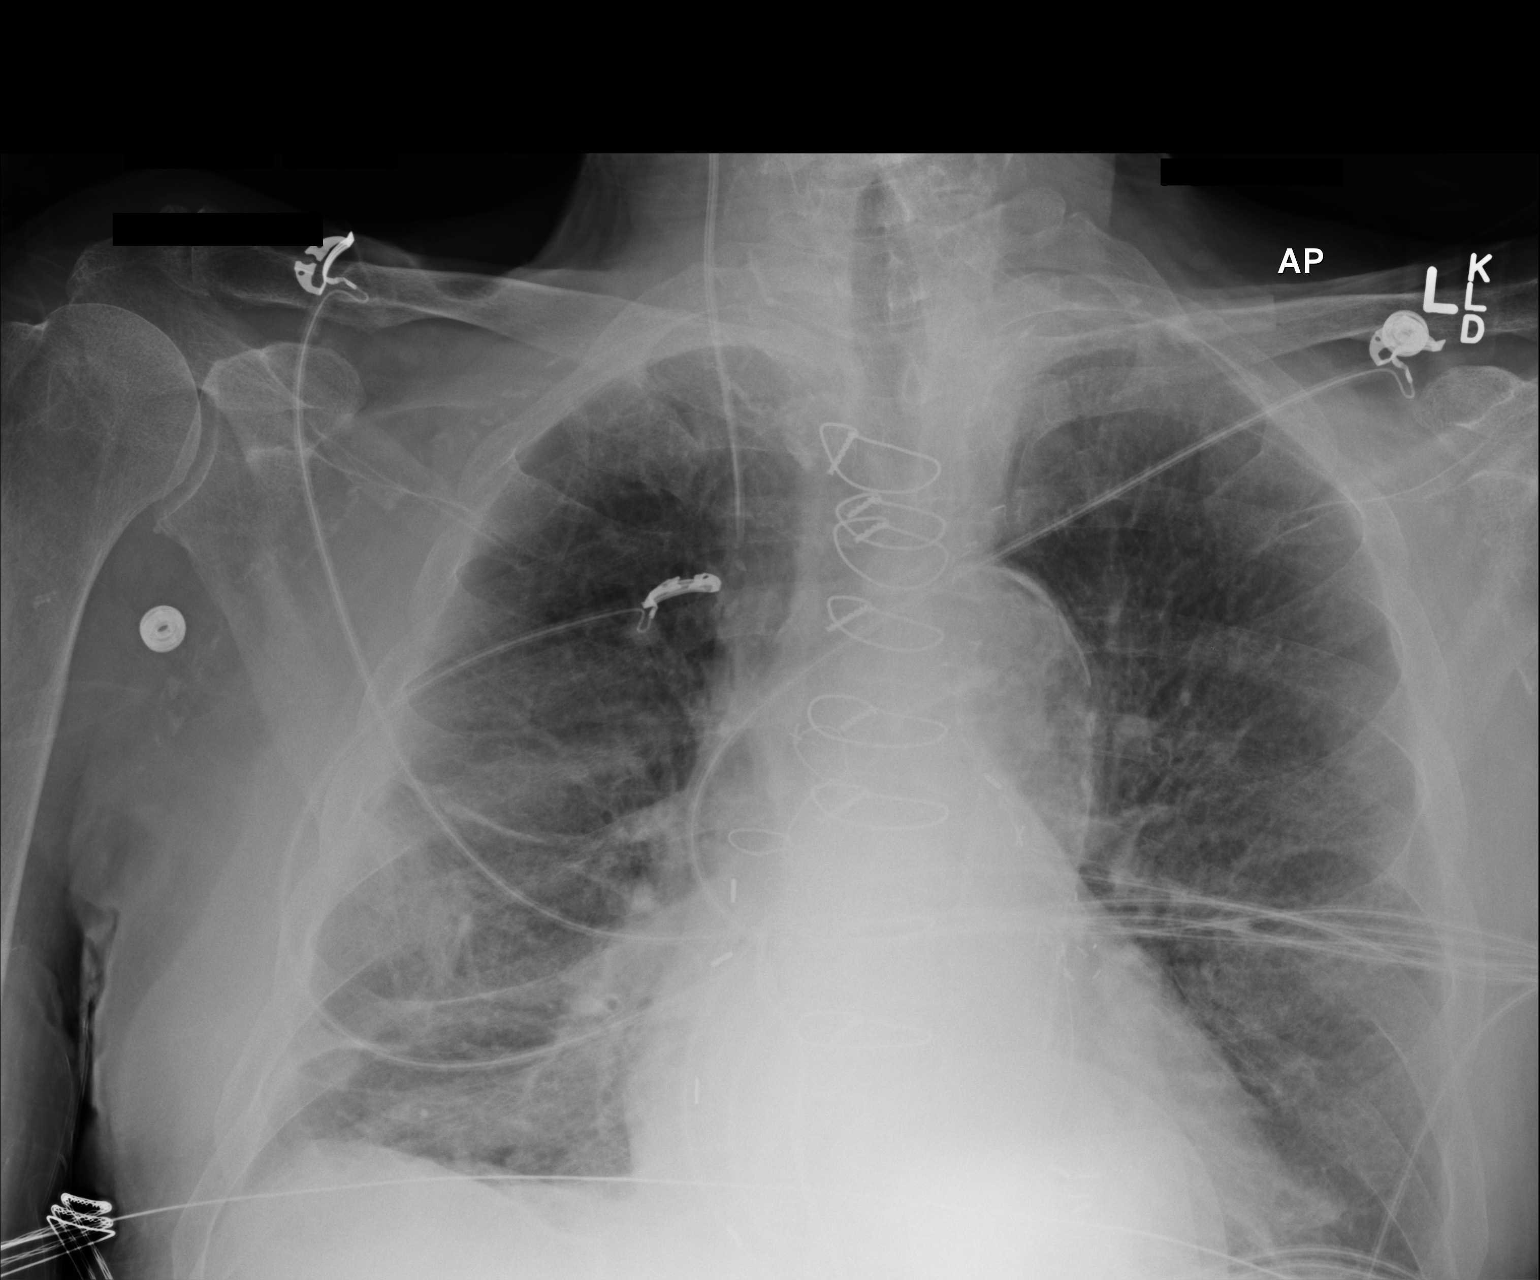

[2 of 2 positions shown; findings below may reference images not displayed]

FINDINGS: Tip of a right IJ central venous catheter projects over the mid SVC.
Median sternotomy wires are unchanged. Cardiomegaly is stable.
Coarse atherosclerotic calcifications again noted within the aortic
arch. Defibrillator pad overlies the lower left chest.

Lung volumes are within normal limits. Bilateral regular
interstitial thickening is similar as compared to the prior exam.
More linear opacities within the bilateral lung bases, right greater
than left are most consistent with atelectasis, slightly increased
as compared to the prior exam. No pneumothorax.

Osseous structures are unchanged.
IMPRESSION: Bibasilar atelectasis, right greater than left, slightly increased
from prior study from 06/19/2013. Otherwise no significant interval
change in appearance of the heart and lungs.

## 2014-03-18 IMAGING — CR DG CHEST 1V PORT
2 series · 2 of 2 positions shown · non-contrast
Comparison: 06/20/2013

CLINICAL DATA: Congestive heart failure

EXAM:
PORTABLE CHEST - 1 VIEW

[AP (1 of 2)]
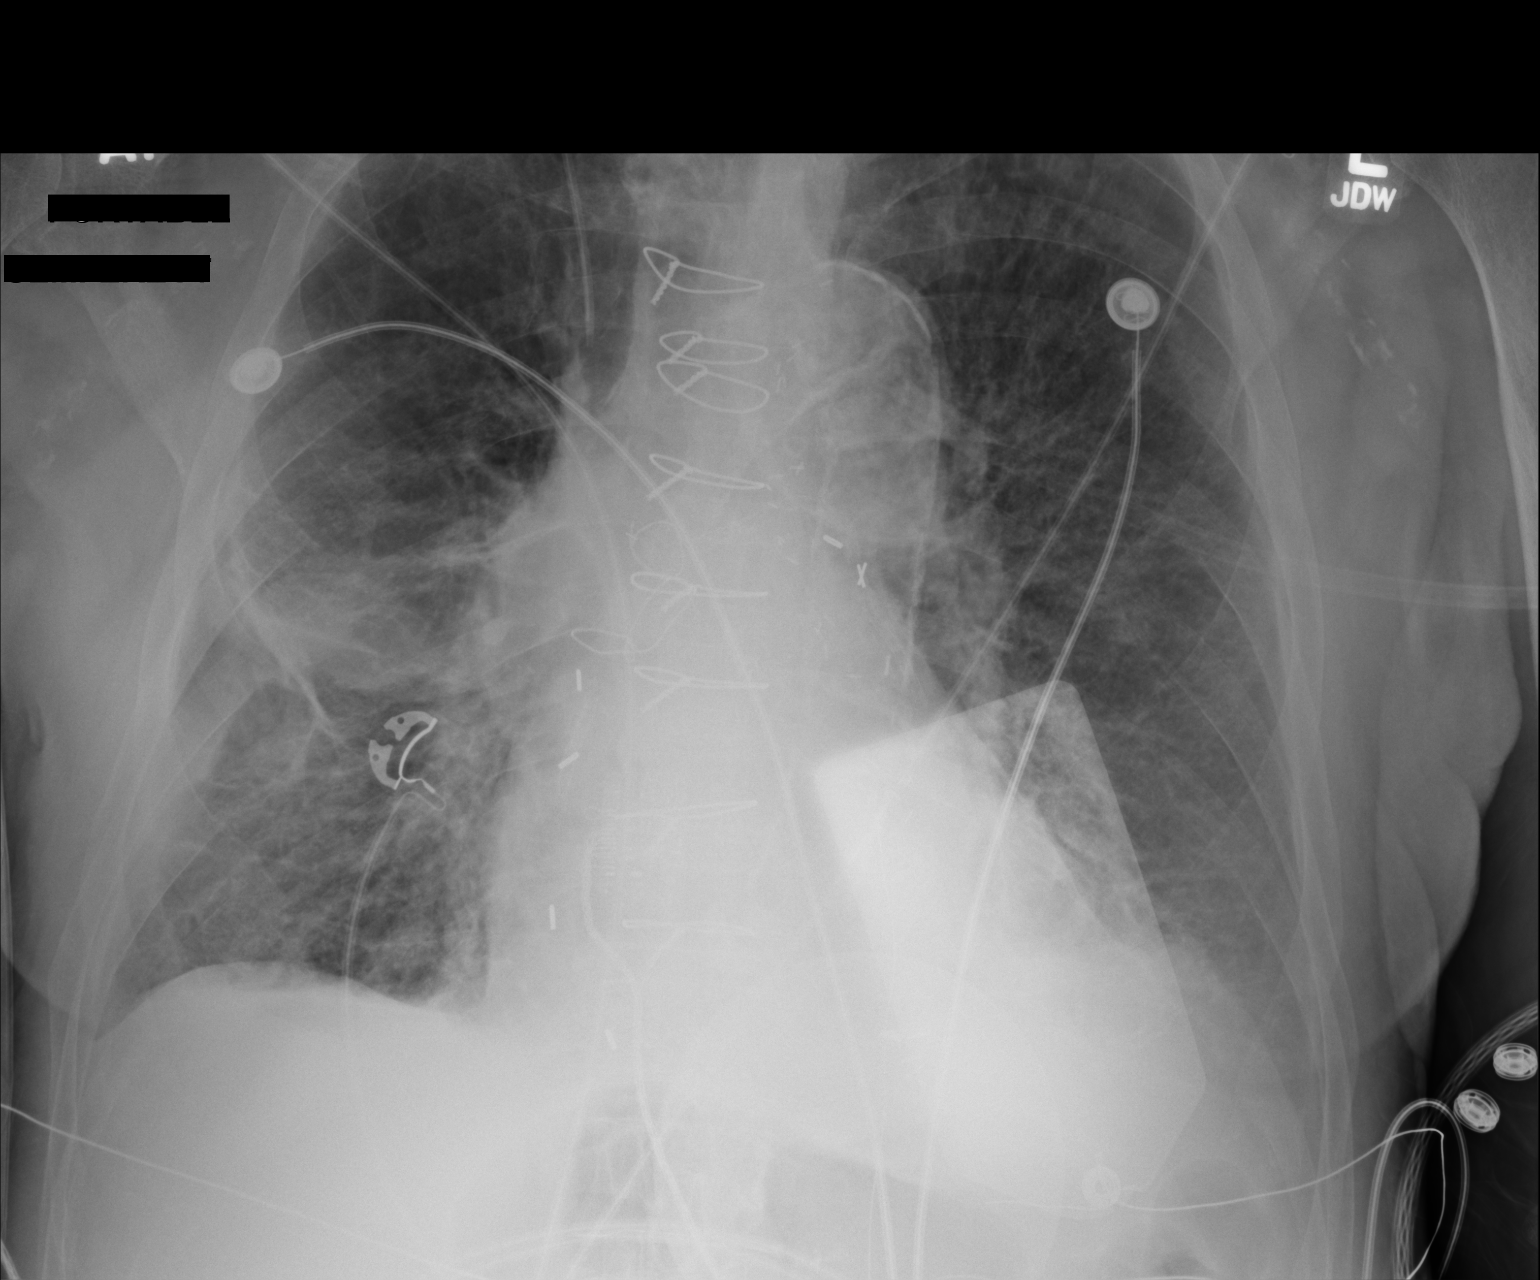

[AP (2 of 2)]
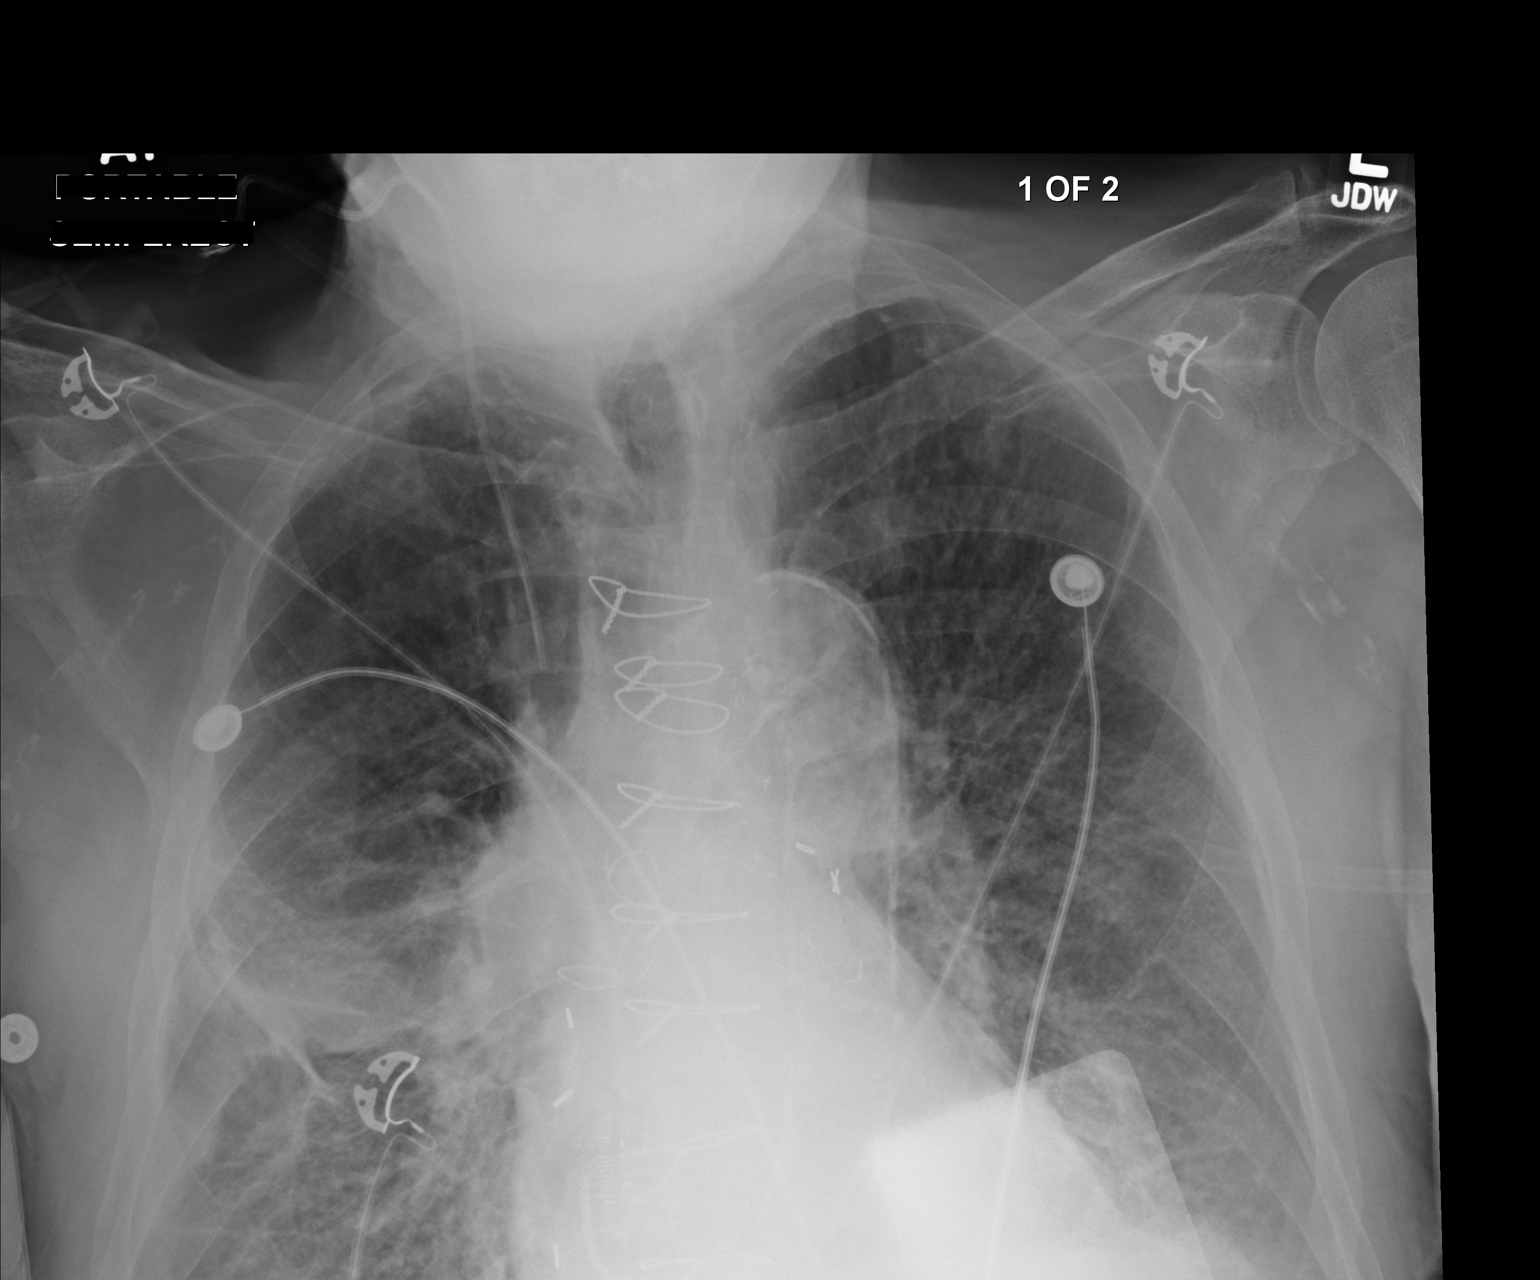

[2 of 2 positions shown; findings below may reference images not displayed]

FINDINGS: Cardiomegaly again noted. Status post median sternotomy. Central
mild vascular congestion and mild interstitial prominence suspicious
for mild interstitial edema. Worsening atelectasis or fluid in minor
fissure right midlung. Stable right IJ central line position.
Atherosclerotic calcifications of thoracic aorta again noted. Mild
basilar atelectasis.
IMPRESSION: Central mild vascular congestion and mild interstitial prominence
suspicious for mild interstitial edema. Worsening atelectasis or
fluid in minor fissure right midlung. Stable right IJ central line
position. Atherosclerotic calcifications of thoracic aorta again
noted. Mild basilar atelectasis.

## 2014-03-21 IMAGING — CR DG CHEST 1V PORT
1 series · 1 of 1 positions shown · non-contrast
Comparison: June 22, 2013.

CLINICAL DATA: CHF.

EXAM:
PORTABLE CHEST - 1 VIEW

[AP]
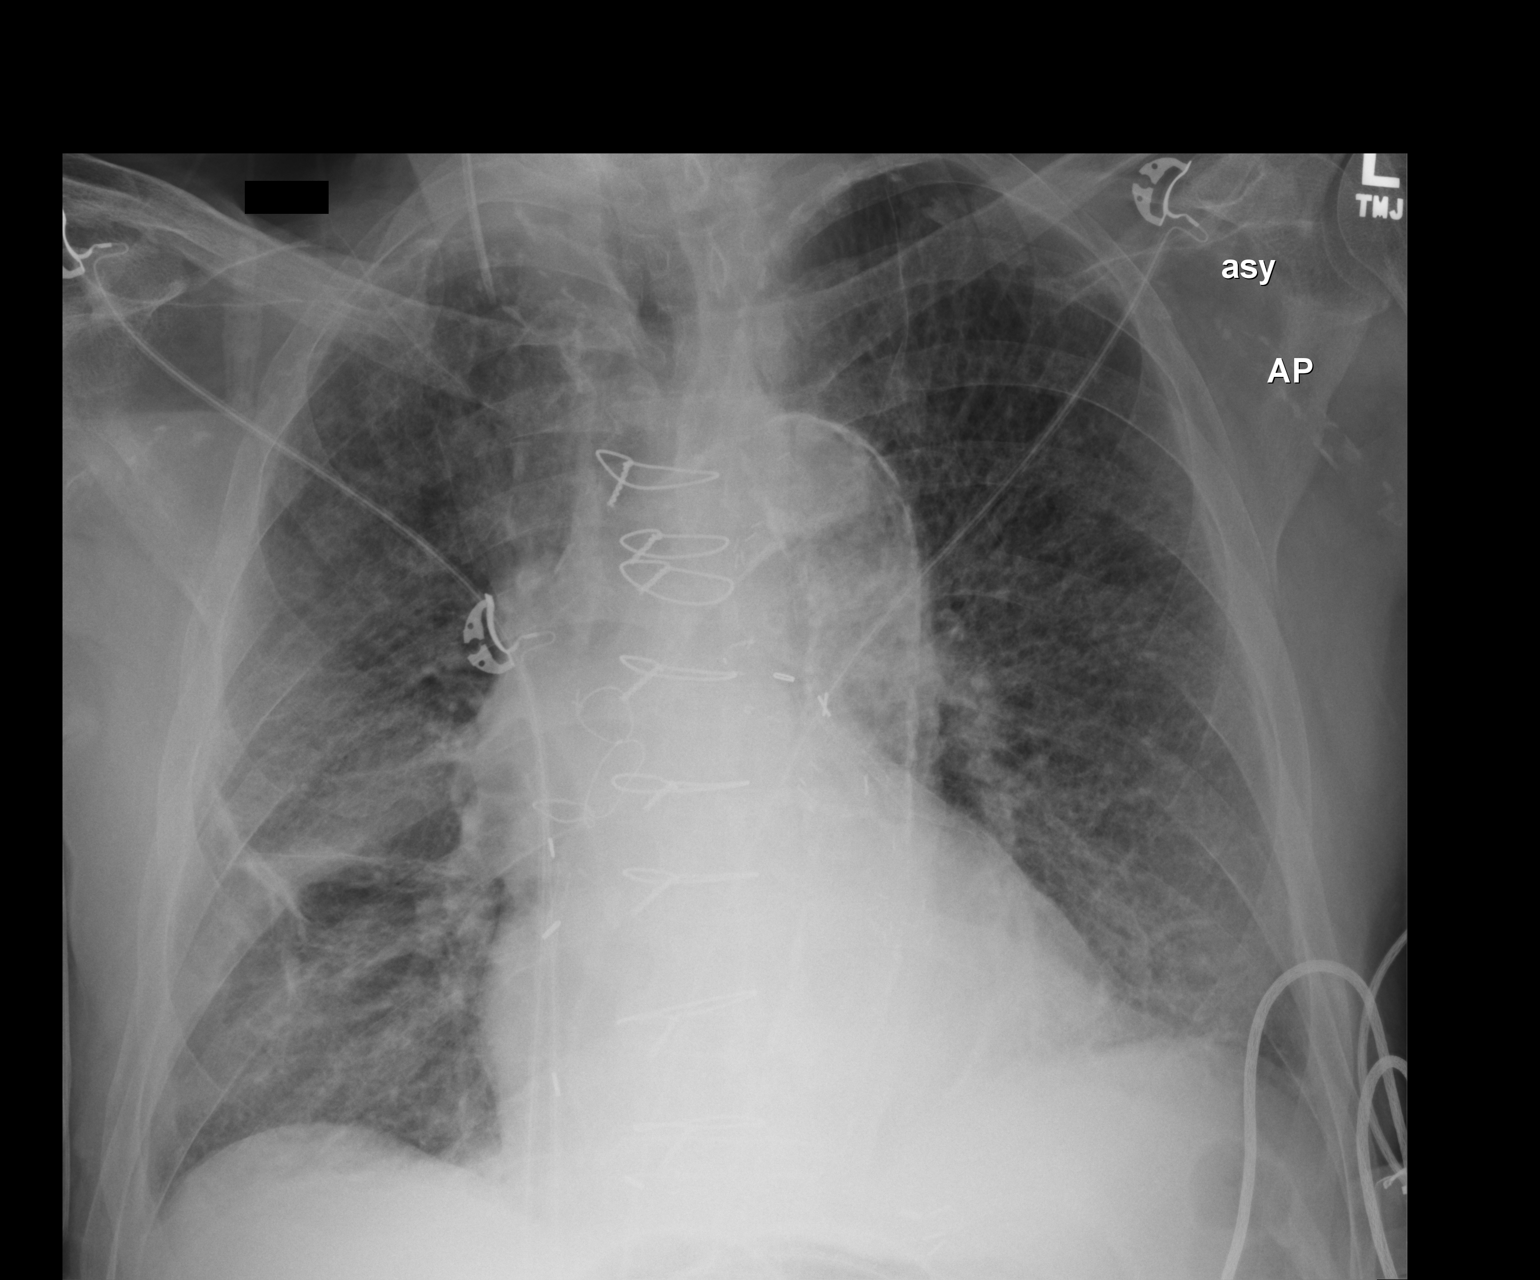

[1 of 1 positions shown; findings below may reference images not displayed]

FINDINGS: The lungs are well expanded. The interstitial markings remain
diffusely increased. The left hemidiaphragm is slightly better
demonstrated today. Persistent fibrotic type density is present in
the lower [DATE] of the right lung. The cardiac silhouette remains
top-normal in size. The pulmonary vascularity is mildly prominent
centrally. The patient has undergone previous CABG. There is
calcification in the wall of the tortuous descending thoracic aorta.
The right internal jugular venous catheter tip lies in the region of
the distal portion of the internal jugular vein and appears to been
withdrawn somewhat since the previous study.
IMPRESSION: 1. There has not been significant interval change in the appearance
of the pulmonary interstitium or cardiac silhouette since the
previous study. The left hemidiaphragm is slightly better
demonstrated today and hazy density in the right mid and lower lung
is somewhat less conspicuous which may reflect some resolving
atelectasis or interstitial edema.
2. The right internal jugular venous catheter appears to brisk been
withdrawn somewhat such that its tip now arise in the region of the
distal portion of the right internal jugular vein.

## 2014-04-01 IMAGING — CR DG CHEST 2V
1 series · 1 of 1 positions shown · non-contrast
Comparison: 06/25/2013

CLINICAL DATA: Epigastric pain for 1 week, low blood pressure

EXAM:
CHEST  2 VIEW

[w chest lat]
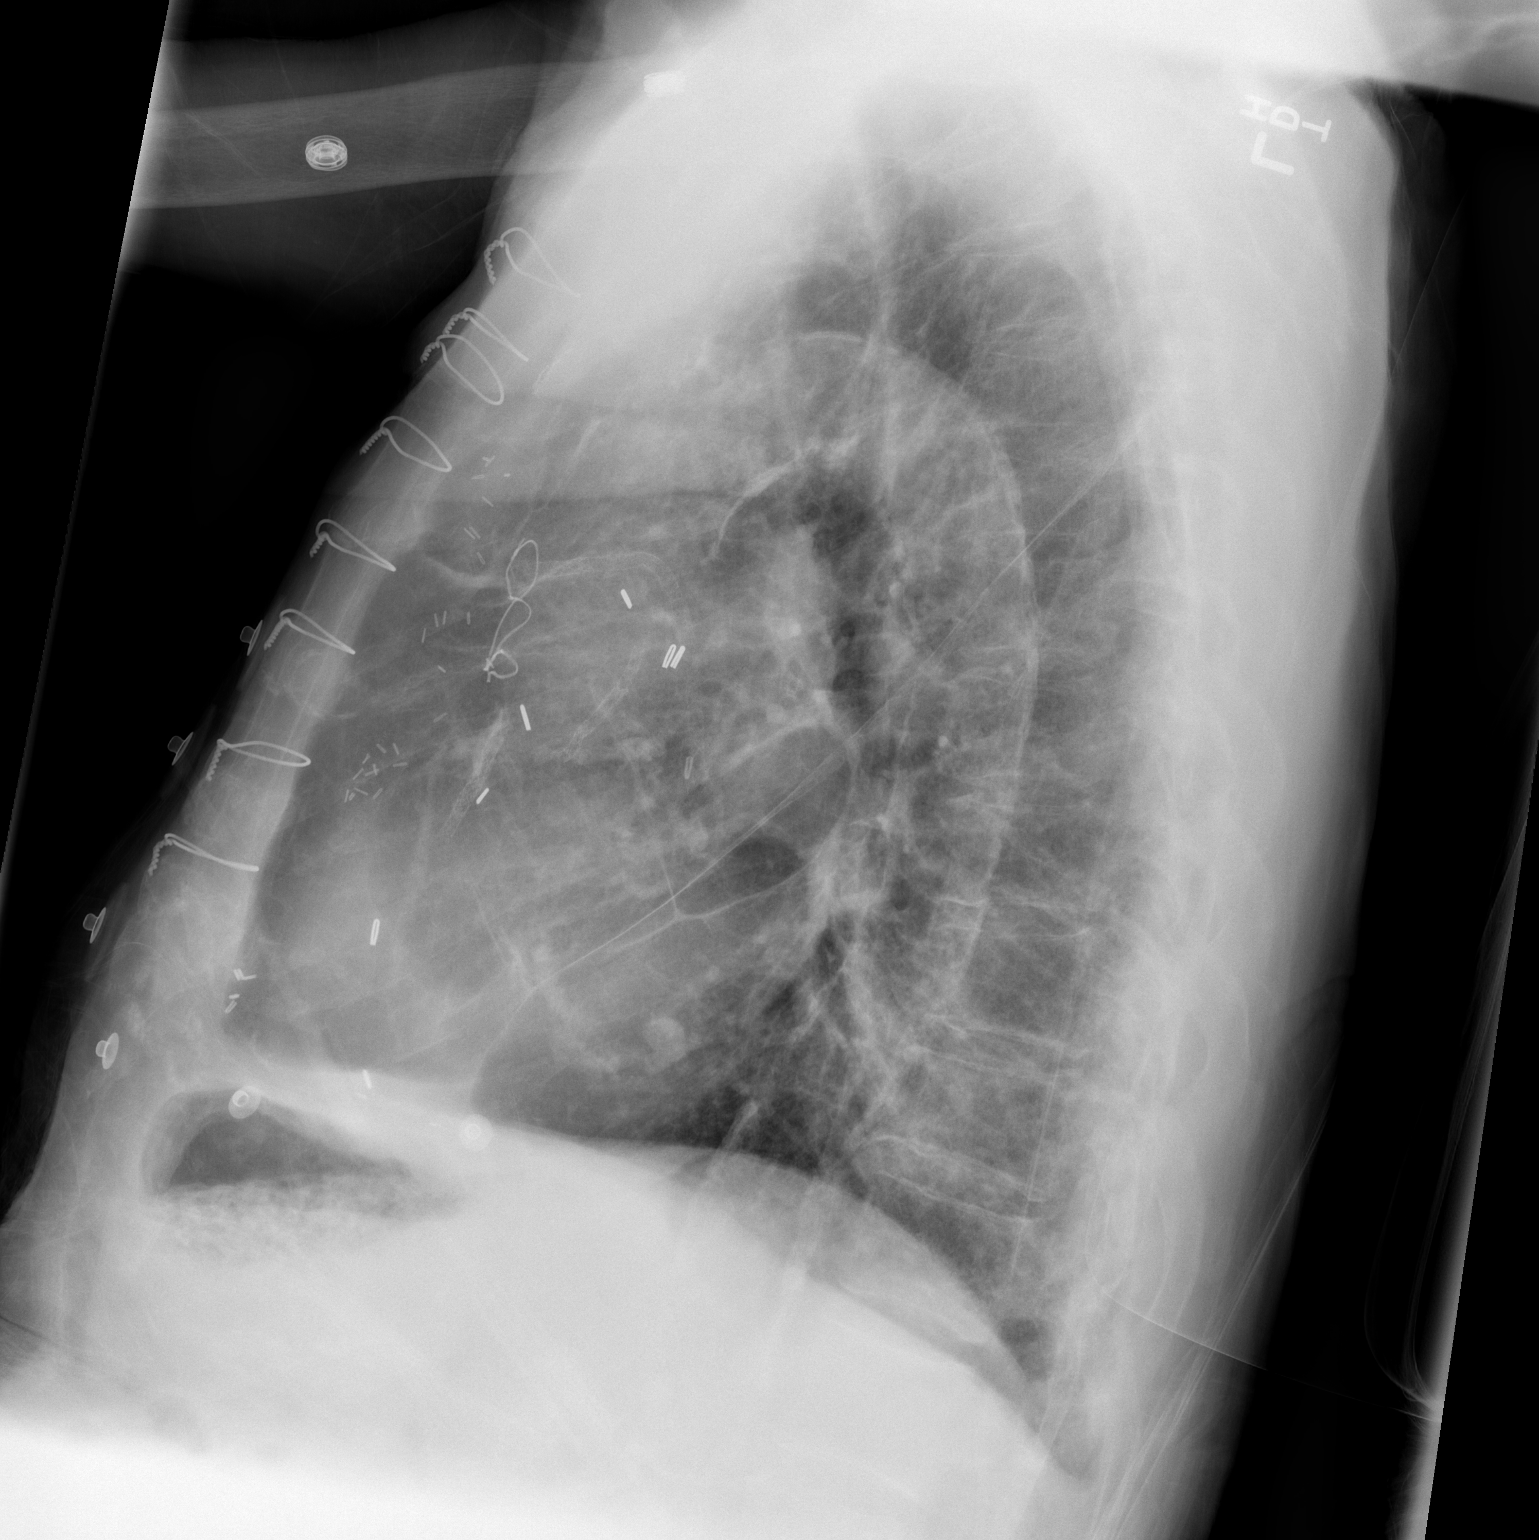

[1 of 1 positions shown; findings below may reference images not displayed]

FINDINGS: Heart size mildly enlarged. Heavy aortic arch calcification.
Vascular pattern normal. Mild diffuse interstitial change most
prominent in the right middle lobe and left base. Unchanged
appearance when compared to prior study, except for mild decrease in
the overall prominence of interstitial change. Right internal
jugular catheter has been removed.
IMPRESSION: Decreased interstitial conspicuity suggesting resolving interstitial
pulmonary edema.

## 2014-04-02 IMAGING — US US RENAL
1 series · 14 of 25 positions shown · non-contrast
Comparison: CT abdomen and pelvis 06/17/2013

CLINICAL DATA: Renal failure

EXAM:
RENAL/URINARY TRACT ULTRASOUND COMPLETE

[Series 1: us renal · 0.21mm/px · 14 of 37 slices shown]
[im 1/37]
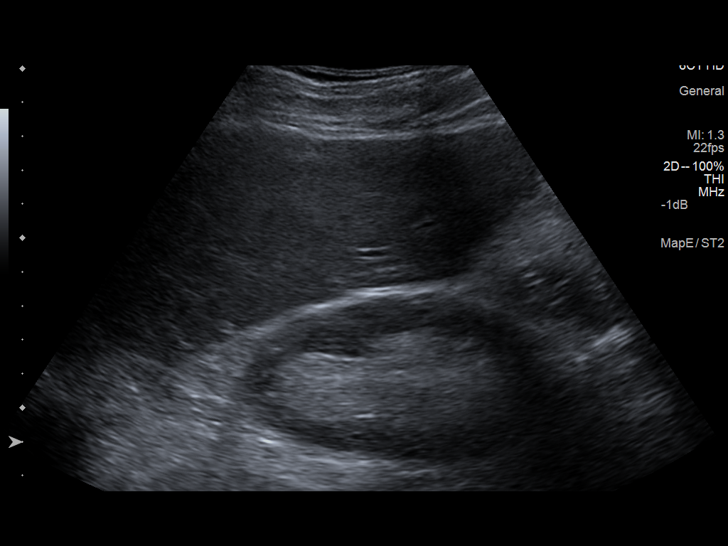
[im 4/37]
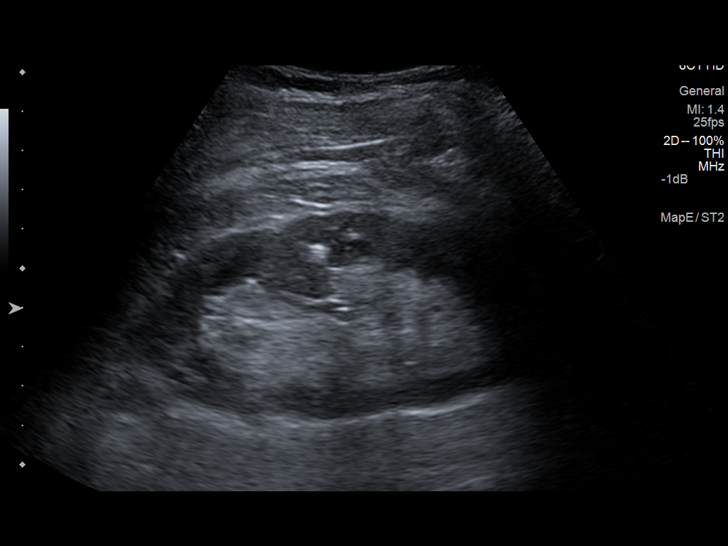
[im 7/37]
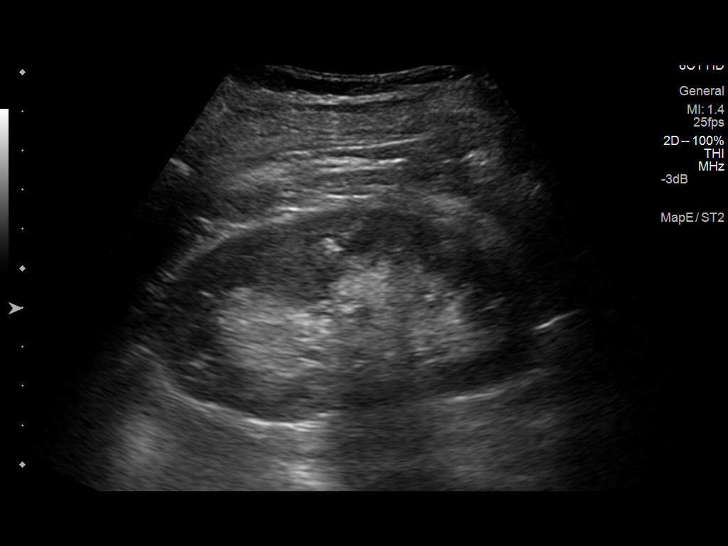
[im 10/37]
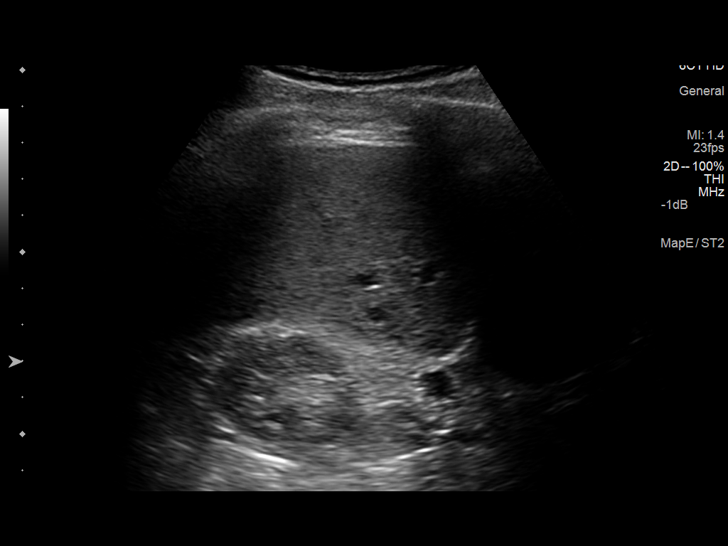
[im 13/37]
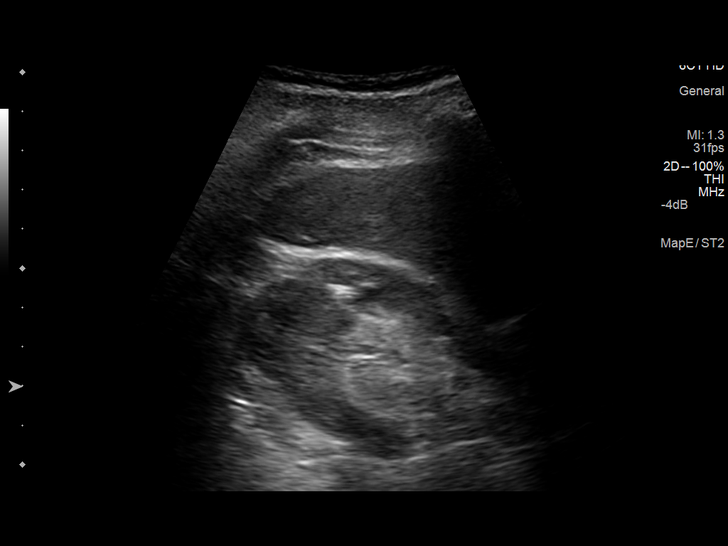
[im 14/37]
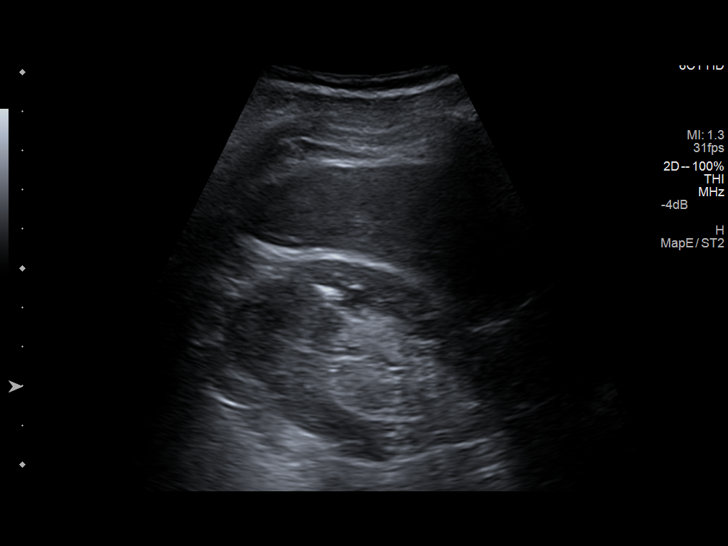
[im 17/37]
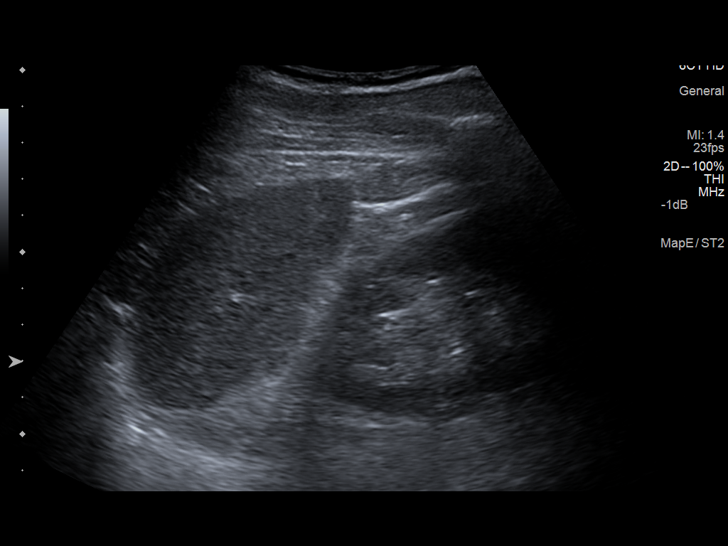
[im 20/37]
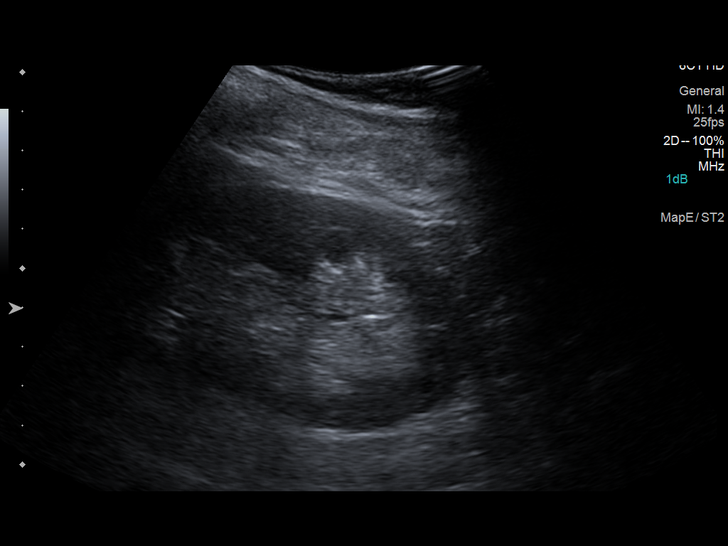
[im 23/37]
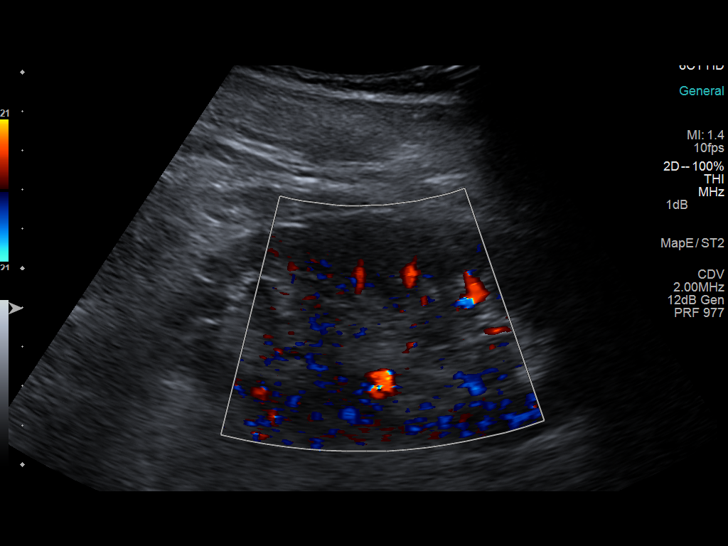
[im 25/37]
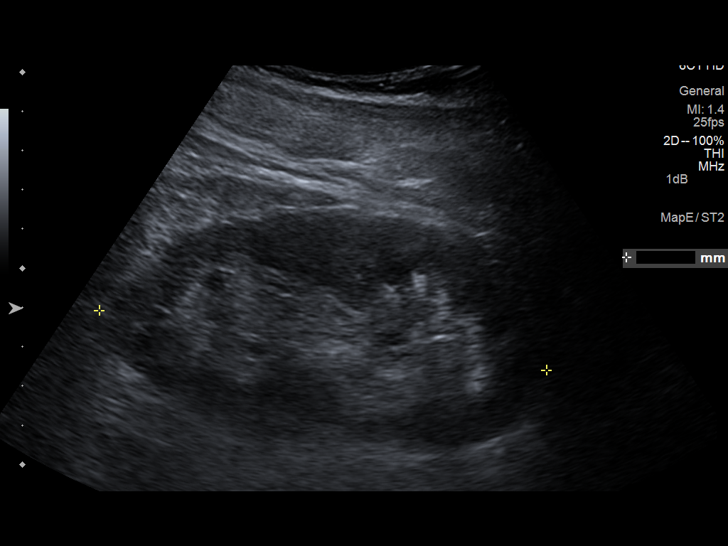
[im 28/37]
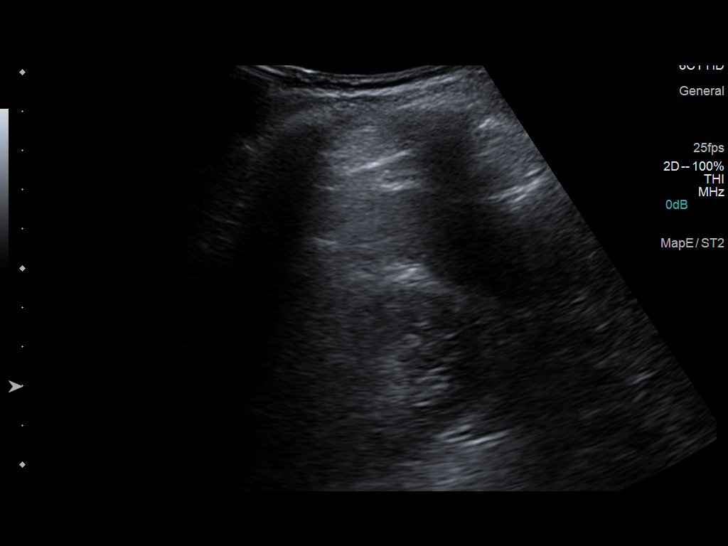
[im 31/37]
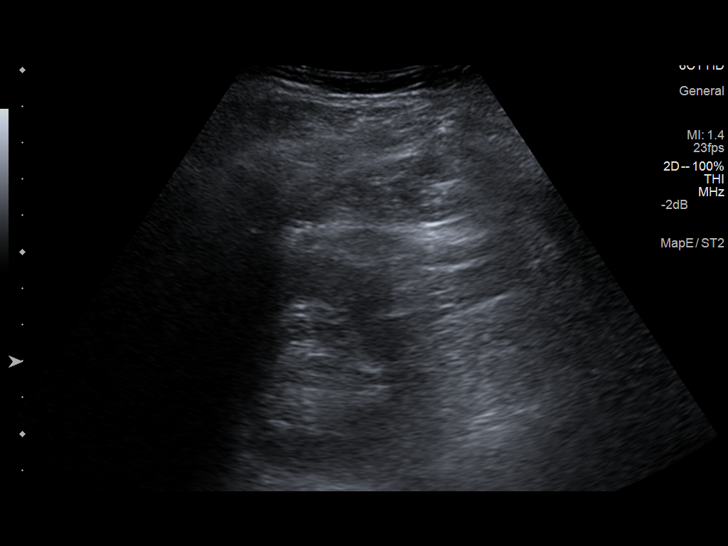
[im 34/37]
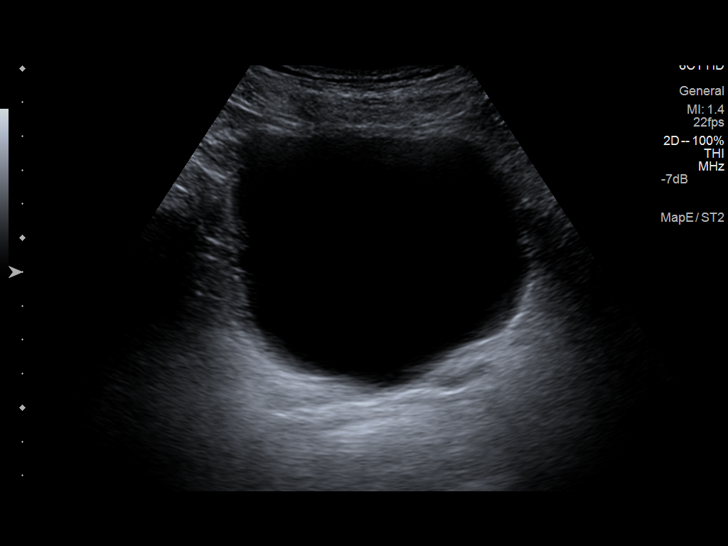
[im 37/37]
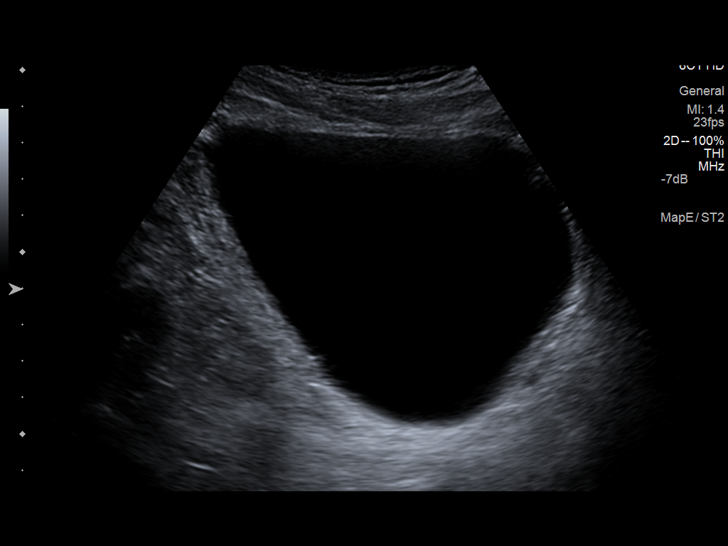

[14 of 25 positions shown; findings below may reference images not displayed]

FINDINGS: Right Kidney

Length: 10.3 cm. Normal cortical thickness and echogenicity for age.
. 3 mm echogenic non shadowing focus mid right kidney corresponding
the calculus on prior CT. No mass or hydronephrosis.

Left Kidney

Length: 11.5 cm. Normal cortical thickness and echogenicity for age.
No mass, hydronephrosis or shadowing calcification.

Bladder

Appears normal for degree of bladder distention.
IMPRESSION: 3 mm nonobstructing mid right renal calculus.

Otherwise negative exam.

## 2014-04-02 IMAGING — CR DG ABDOMEN 1V
1 series · 1 of 1 positions shown · non-contrast
Comparison: 06/17/2013

CLINICAL DATA: Increased abdominal pain, evaluate for ileus

EXAM:
ABDOMEN - 1 VIEW

[t abdomen supine]
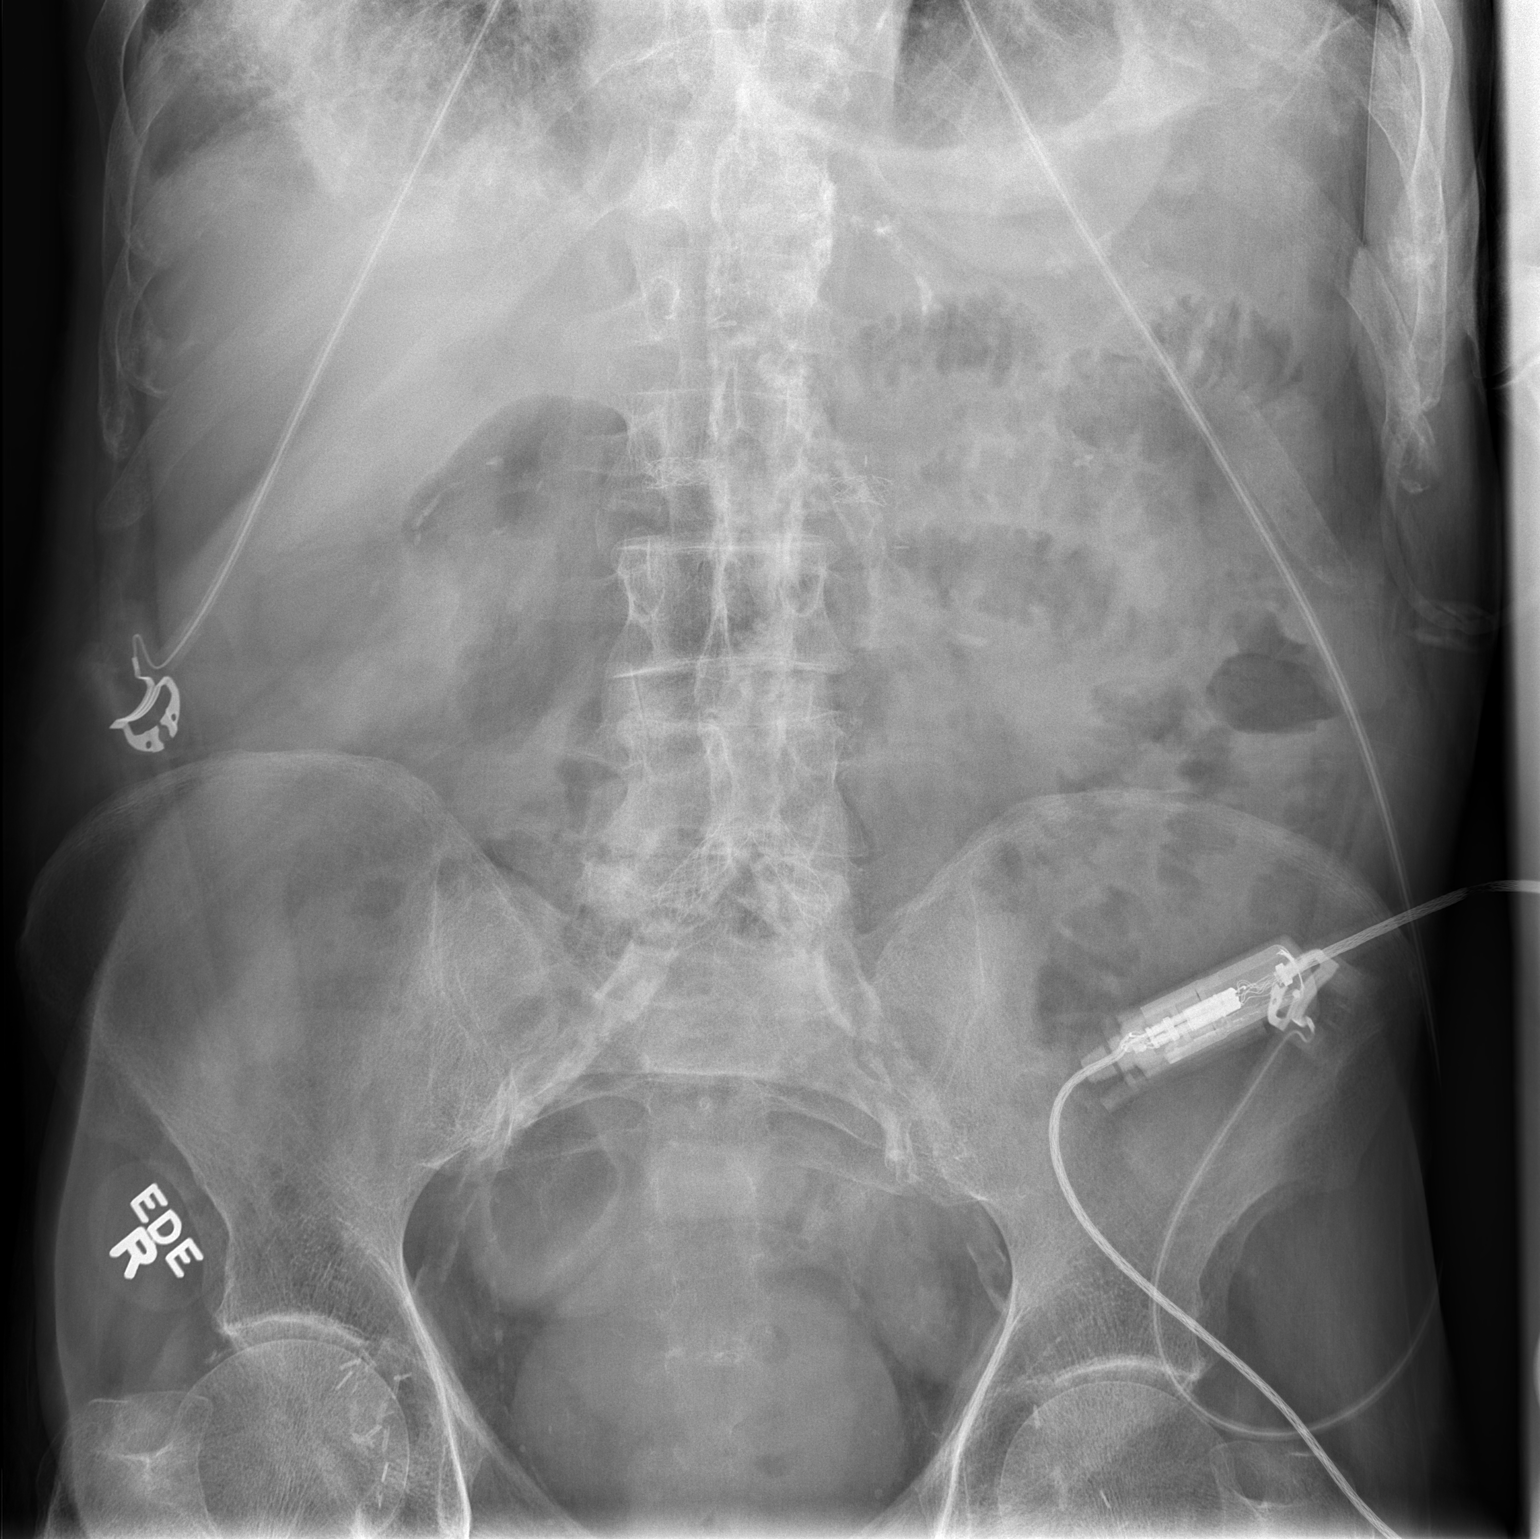

[1 of 1 positions shown; findings below may reference images not displayed]

FINDINGS: Mild nonspecific gas distention of the bowel. Negative for
significant obstruction or ileus. Extensive aortoiliac
calcifications noted. Bilateral renal and common iliac stents
present. No acute osseous finding.
IMPRESSION: Negative for significant obstruction or ileus.

## 2014-04-03 IMAGING — US US ABDOMEN COMPLETE
1 series · 14 of 25 positions shown · non-contrast
Comparison: None.

CLINICAL DATA: Nausea.  Abdominal pain.

EXAM:
ULTRASOUND ABDOMEN COMPLETE

[Series 1: us abdomen complete · 0.26mm/px · 14 of 45 slices shown]
[im 1/45]
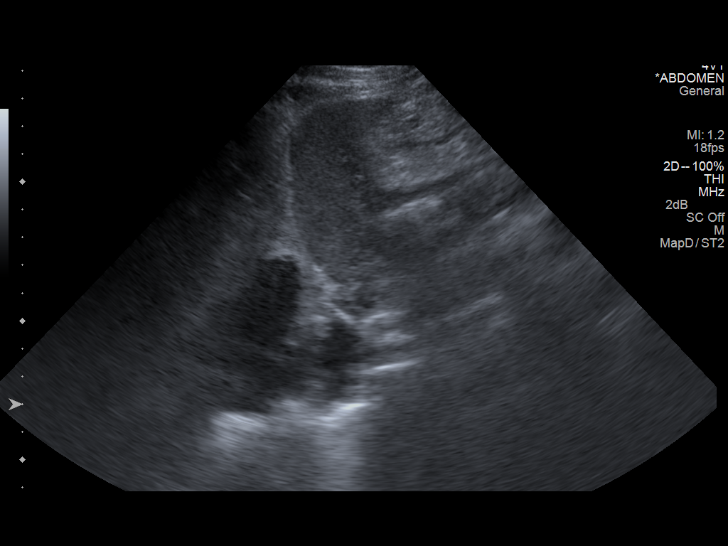
[im 4/45]
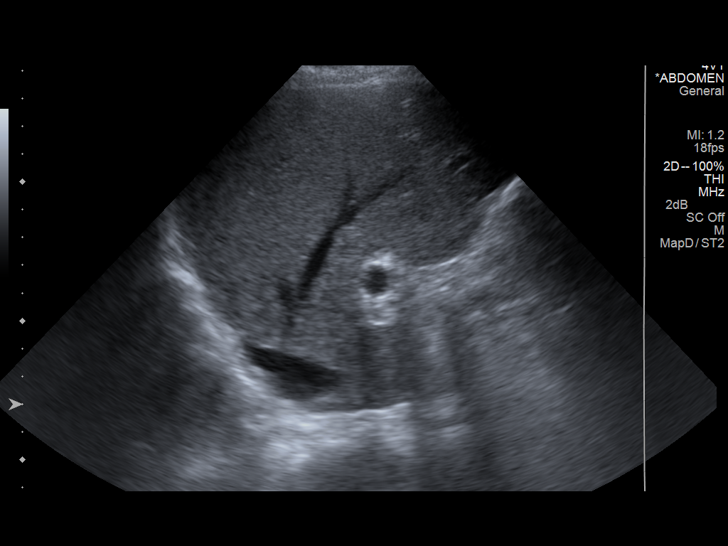
[im 8/45]
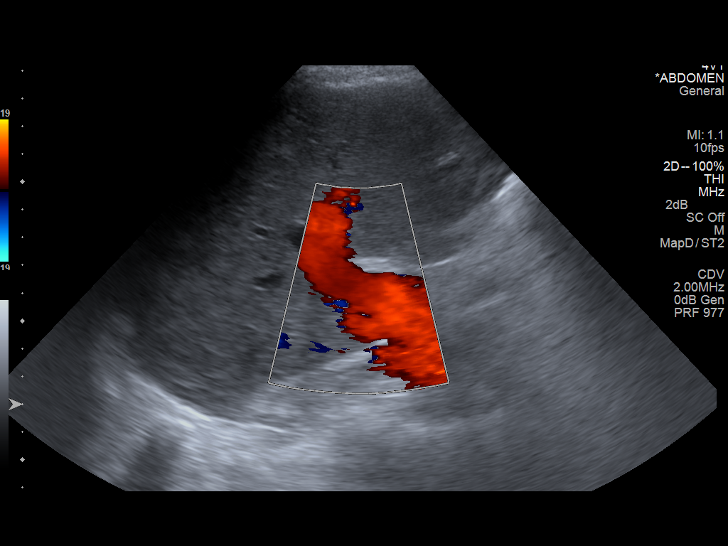
[im 12/45]
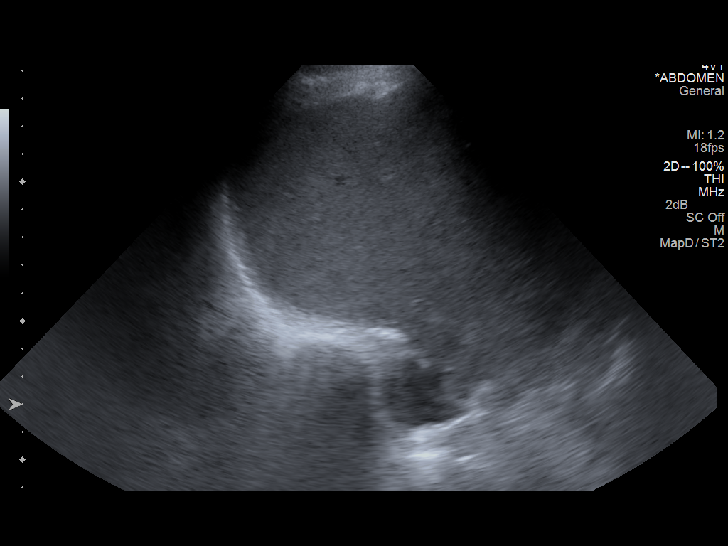
[im 15/45]
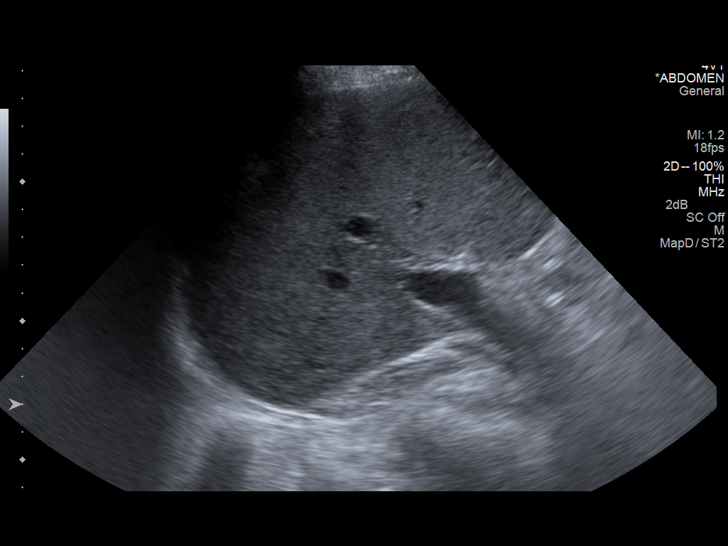
[im 17/45]
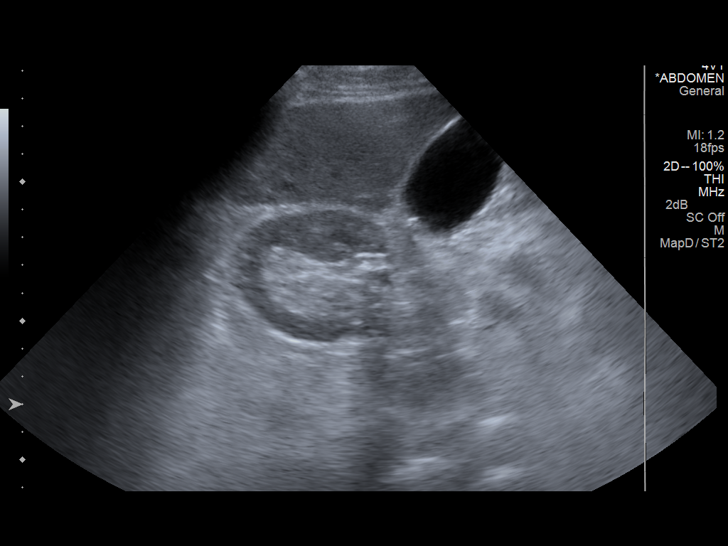
[im 21/45]
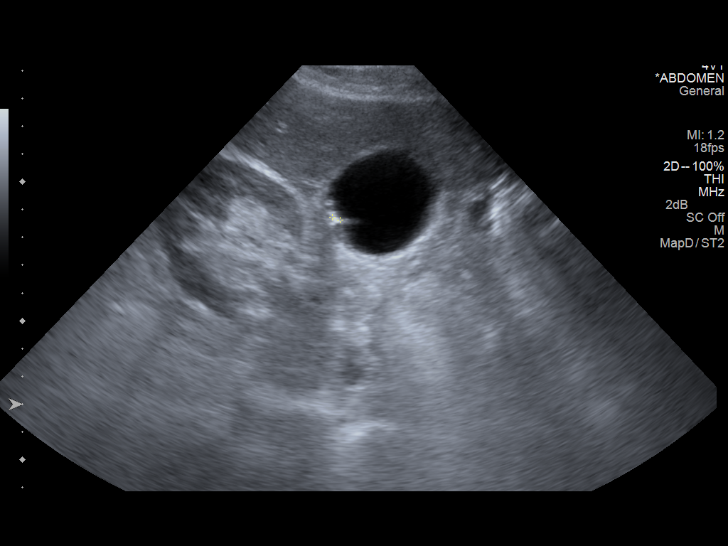
[im 24/45]
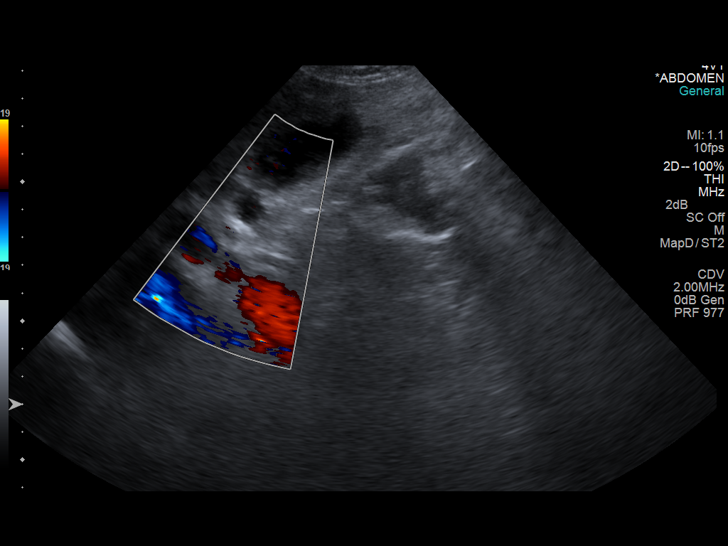
[im 28/45]
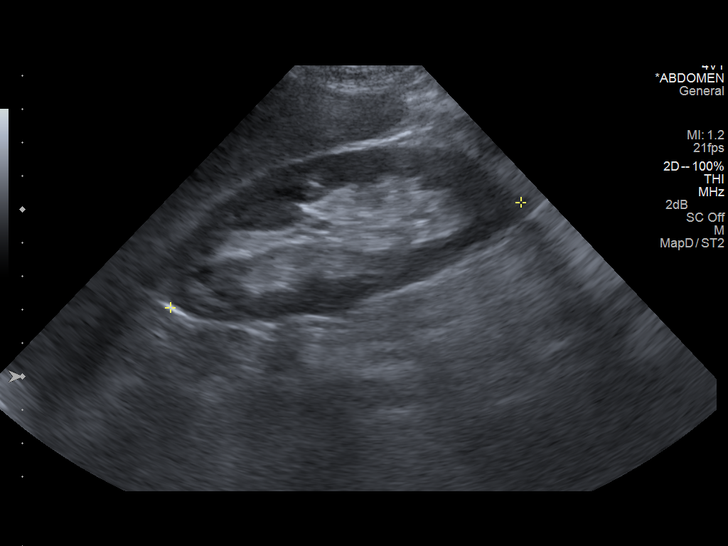
[im 30/45]
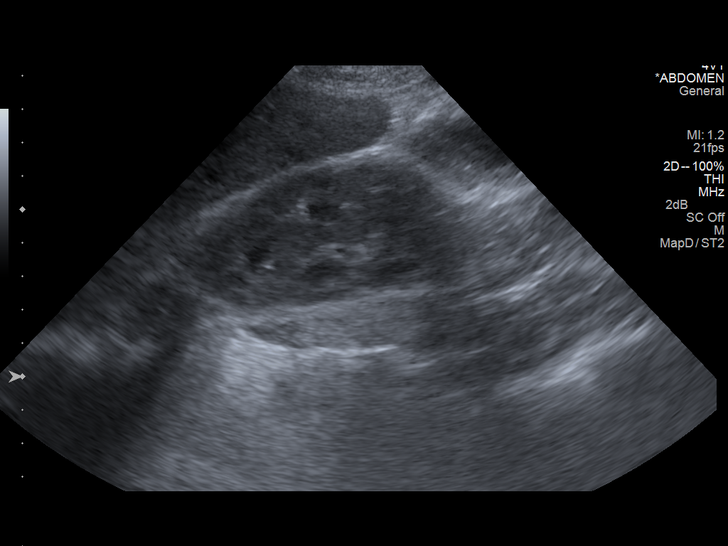
[im 34/45]
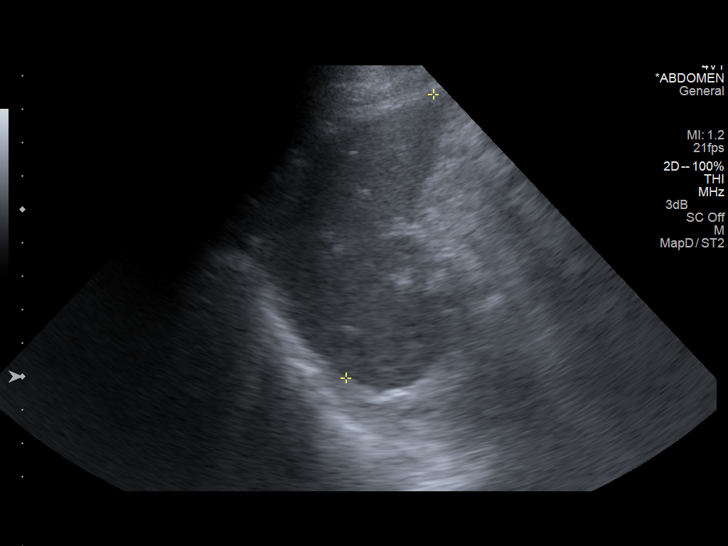
[im 37/45]
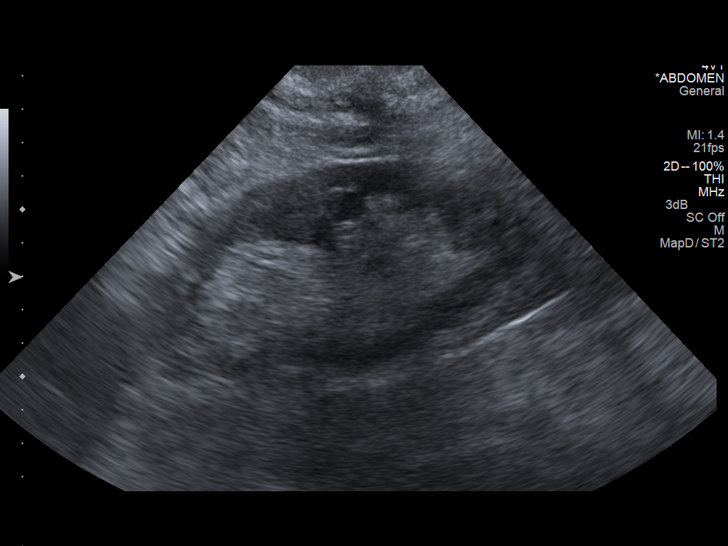
[im 41/45]
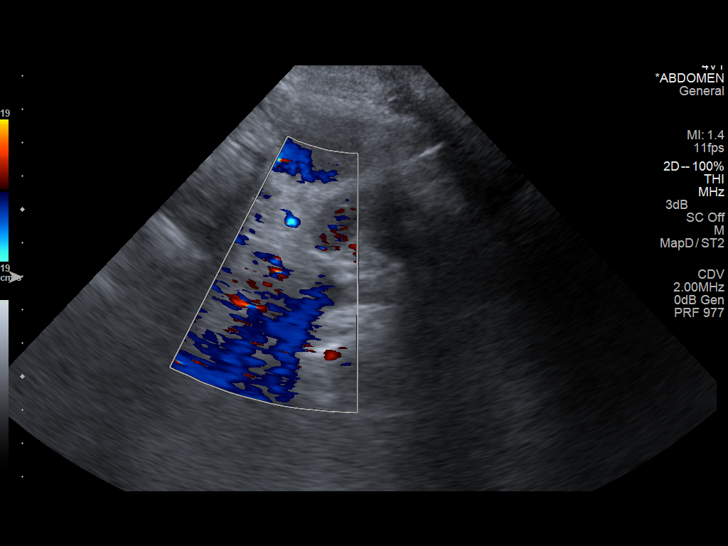
[im 45/45]
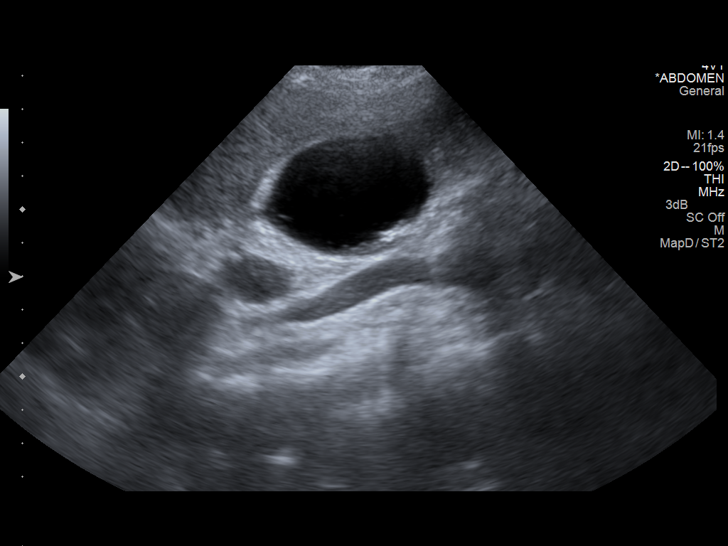

[14 of 25 positions shown; findings below may reference images not displayed]

FINDINGS: Gallbladder

Several small less than 1 cm gallstones noted as well as minimal
amount of sludge. No evidence of gallbladder wall thickening. No
sonographic Murphy sign noted.

Common bile duct

Diameter: 4 mm

Liver

No focal lesion identified. Within normal limits in parenchymal
echogenicity.

IVC

No abnormality visualized.

Pancreas

Visualized portion unremarkable.

Spleen

Size and appearance within normal limits.

Right Kidney

Length: 11.0 cm. Echogenicity within normal limits. No mass or
hydronephrosis visualized.

Left Kidney

Length: 12.4 cm. Echogenicity within normal limits. No mass or
hydronephrosis visualized.

Abdominal aorta

No aneurysm visualized.
IMPRESSION: Cholelithiasis. No sonographic signs of cholecystitis, biliary
dilatation, or other acute findings.

## 2014-04-13 IMAGING — CT CT ABD-PELV W/ CM
2 of 5 series · 15 of 46 positions shown, 17 images · IV contrast (APPLIED)
Comparison: 06/17/2013.

CLINICAL DATA: Vomiting. Recently discharged after being admitted
for peptic ulcers.

EXAM:
CT ABDOMEN AND PELVIS WITH CONTRAST
TECHNIQUE: Multidetector CT imaging of the abdomen and pelvis was performed
using the standard protocol following bolus administration of
intravenous contrast.
CONTRAST:  100mL OMNIPAQUE IOHEXOL 300 MG/ML  SOLN

[Series 2: abd/ pelvis 5.0 i30f 1 · axial · 0.87mm/px · z∈[-297,+123]mm · 12 of 94 slices shown, 14 images]
[im 5/94  soft-tissue]
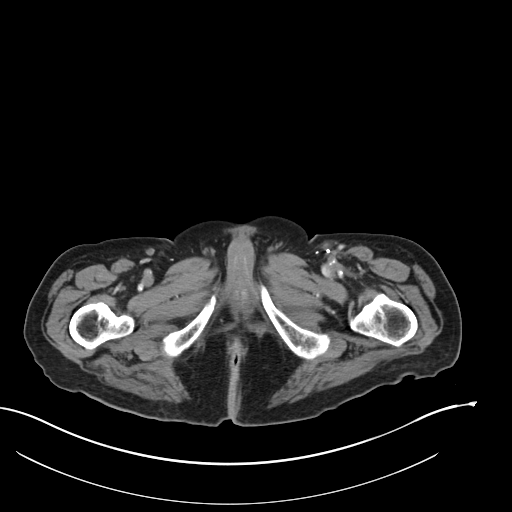
[im 5/94  bone]
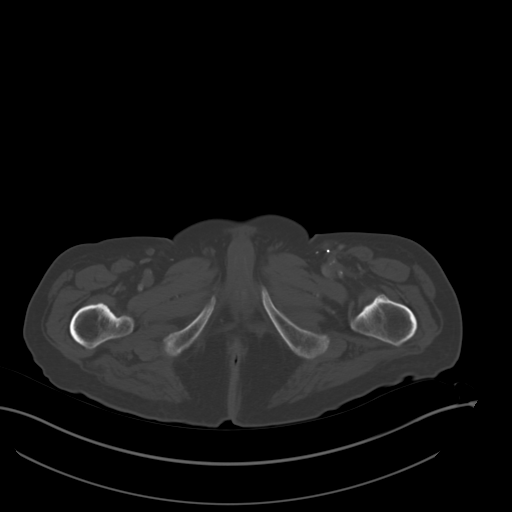
[im 14/94  soft-tissue]
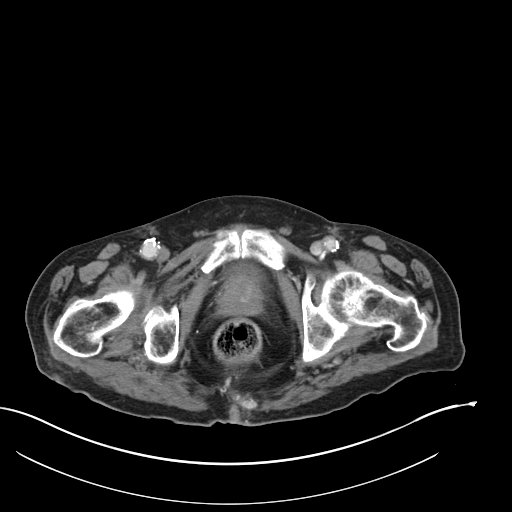
[im 19/94  soft-tissue]
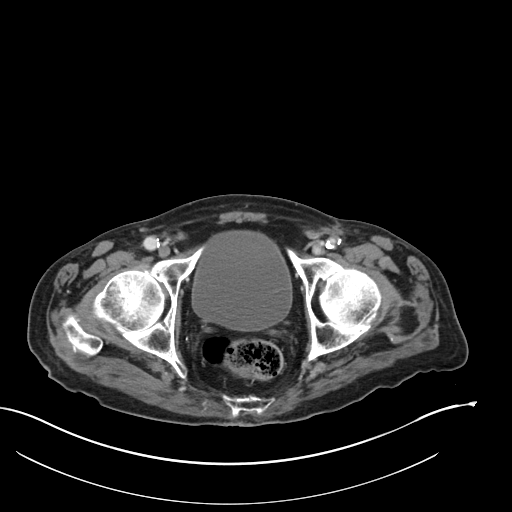
[im 28/94  soft-tissue]
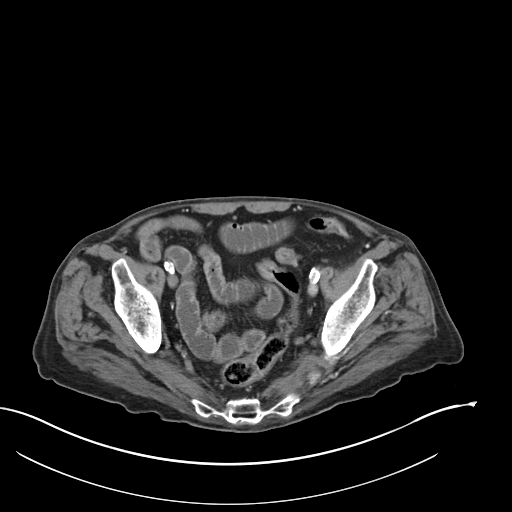
[im 38/94  soft-tissue]
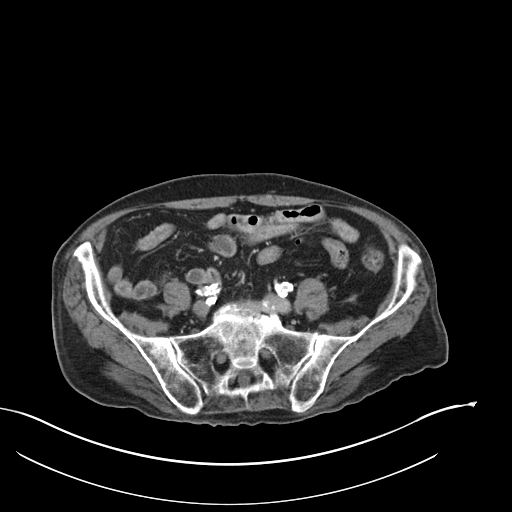
[im 42/94  soft-tissue]
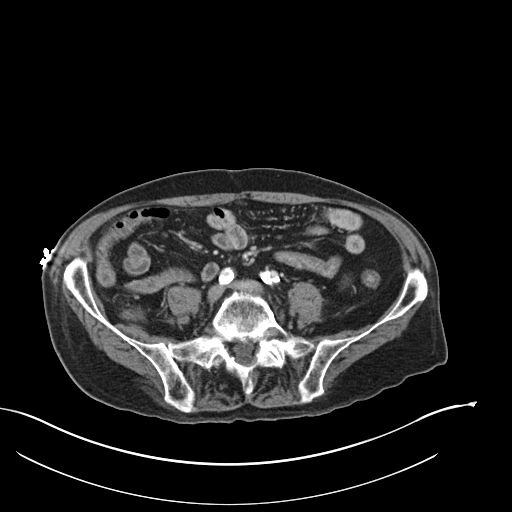
[im 52/94  soft-tissue]
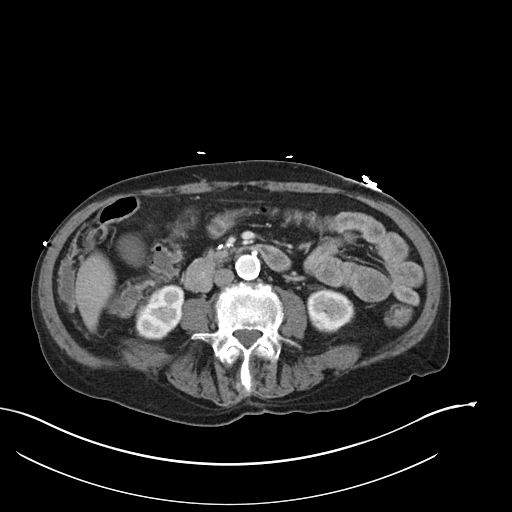
[im 56/94  soft-tissue]
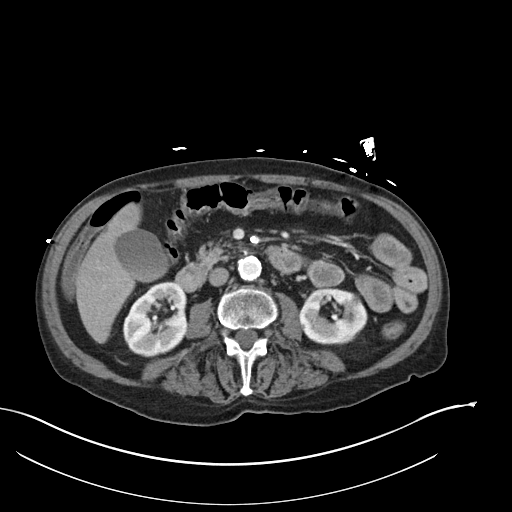
[im 66/94  soft-tissue]
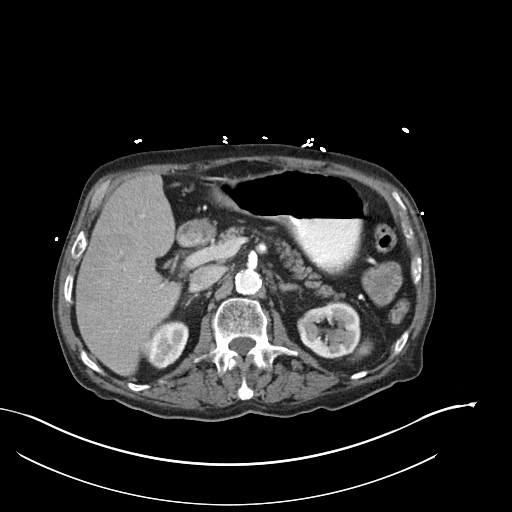
[im 66/94  bone]
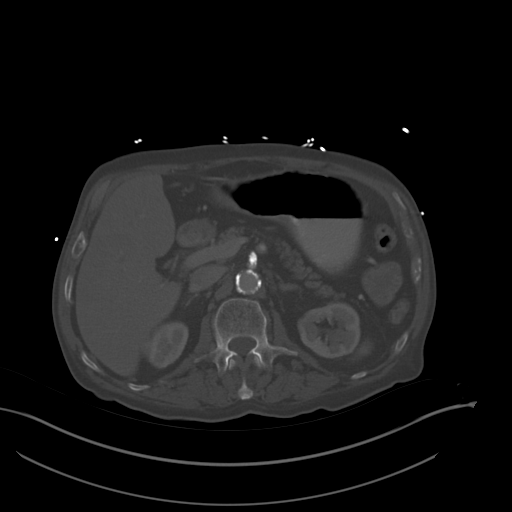
[im 75/94  soft-tissue]
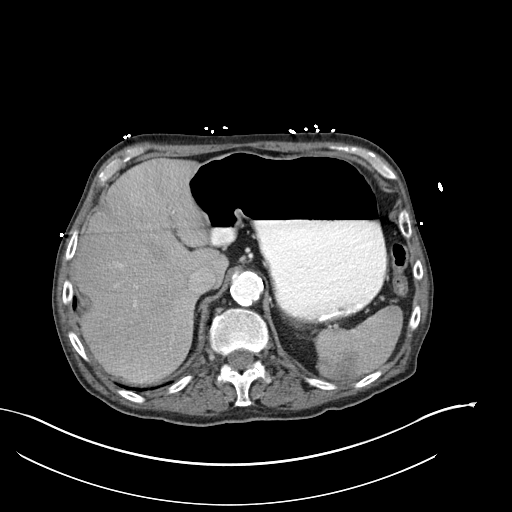
[im 80/94  soft-tissue]
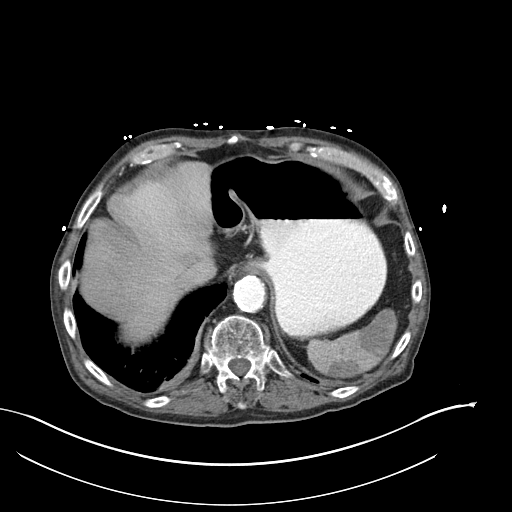
[im 89/94  soft-tissue]
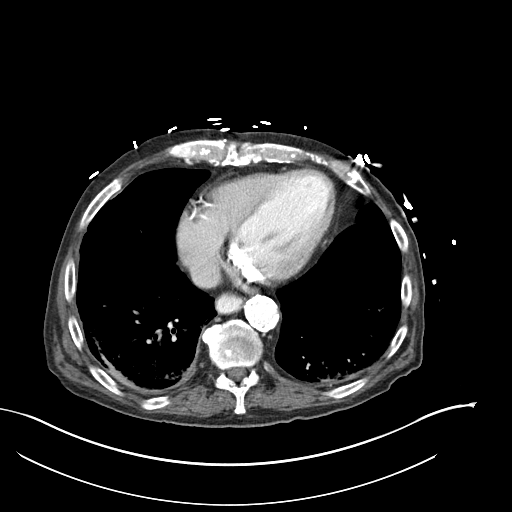

[Series 4: cororal soft tissue · coronal · 0.92mm/px · 3 of 127 slices shown]
[im 43/127  soft-tissue]
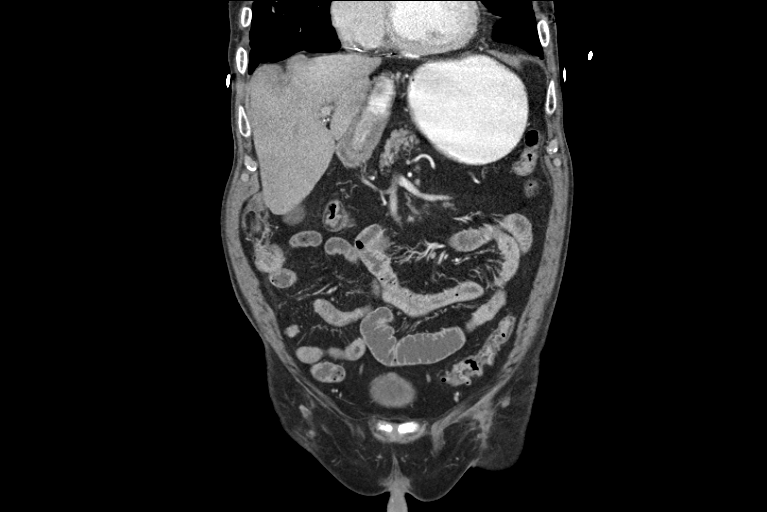
[im 57/127  soft-tissue]
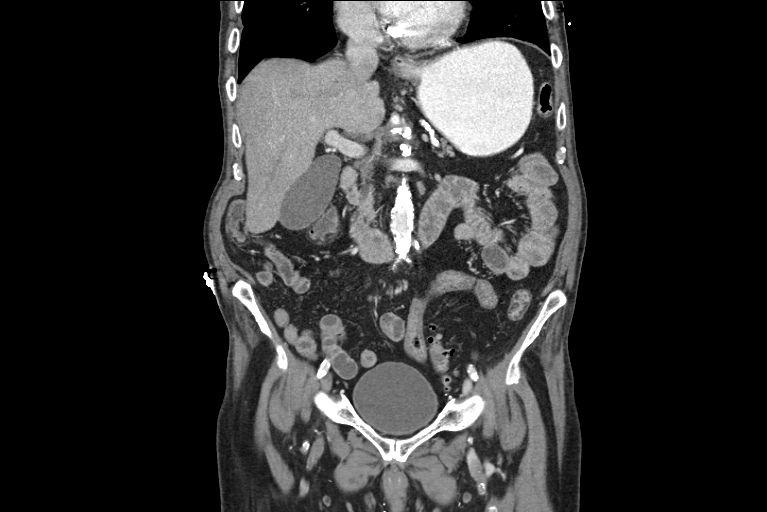
[im 71/127  soft-tissue]
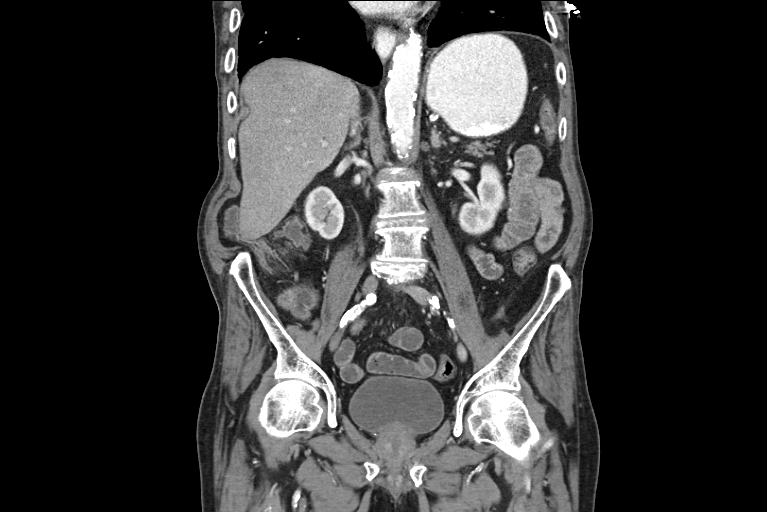

[15 of 46 positions shown; findings below may reference images not displayed]

FINDINGS: Multiple sigmoid colon diverticula are again demonstrated without
evidence of diverticulitis. Multiple normal caliber fluid-filled
loops of small bowel. Diffuse low density wall thickening in the
duodenal bulb, measuring 1.2 cm in thickness on image number 26.
This is producing moderate luminal narrowing. Tiny gallstones in the
gallbladder. The largest measures 4 mm in maximum diameter. No
gallbladder wall thickening or pericholecystic fluid.

Inhomogeneous enhancement of the liver and spleen with no discrete
liver mass seen. 2.6 x 2.1 cm left adrenal mass with low density
components. This was previously shown to measure 5 Hounsfield units
without intravenous contrast, compatible with a benign adenoma.
Small left renal cysts. Unremarkable right kidney, urinary bladder
and prostate gland. No enlarged lymph nodes. No evidence of
appendicitis. Dense atheromatous arterial calcifications. Bilateral
renal artery stents. The no significant change in mild bibasilar
atelectasis/scarring. Mild lumbar lower thoracic spine degenerative
changes.
IMPRESSION: 1. Do tonight is involving the duodenal bulb with moderate luminal
narrowing.
2. Normal caliber fluid-filled small bowel loops. This can be seen
with gastroenteritis.
3. Cholelithiasis without evidence of cholecystitis.
4. Extensive arterial calcifications with bilateral renal artery
stents.
5. Sigmoid diverticulosis.
6. Stable left adrenal adenoma.

## 2014-04-13 IMAGING — CR DG CHEST 1V PORT
1 series · 1 of 1 positions shown · non-contrast
Comparison: 07/06/2013.

CLINICAL DATA: Vomiting.  Smoker.

EXAM:
PORTABLE CHEST - 1 VIEW

[AP]
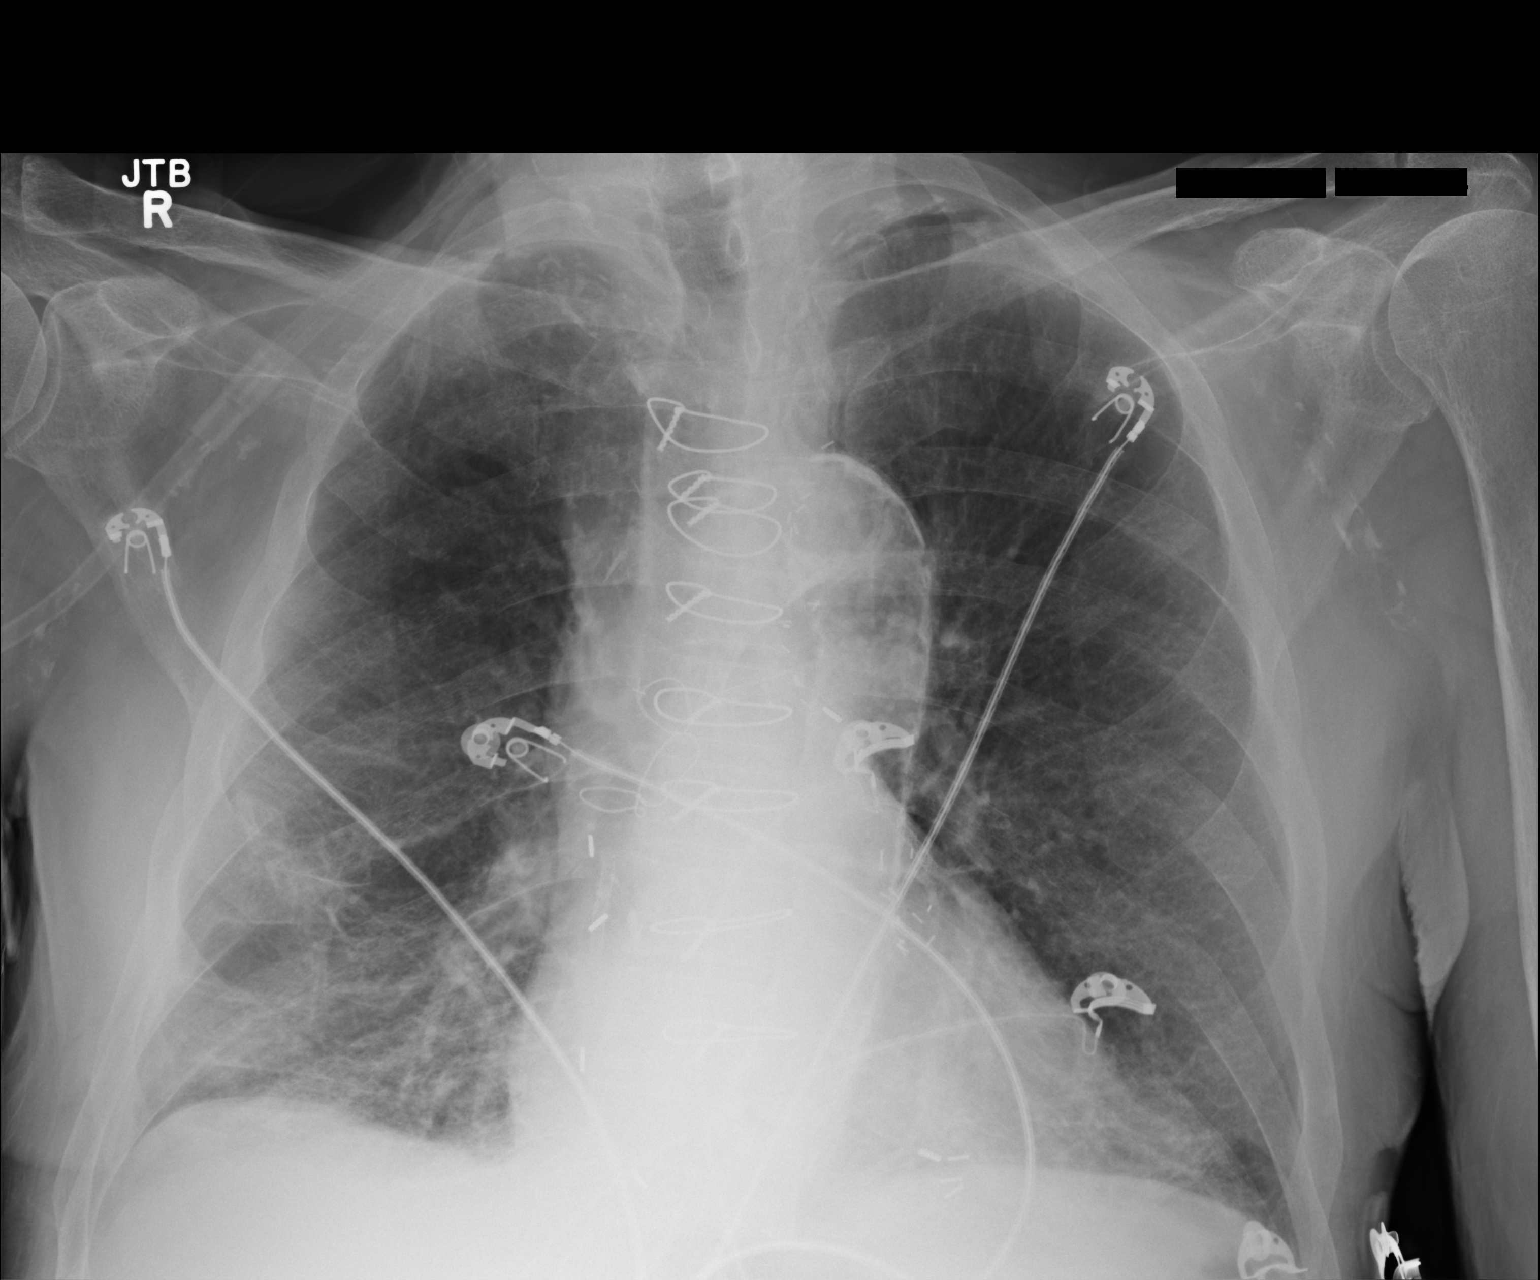

[1 of 1 positions shown; findings below may reference images not displayed]

FINDINGS: Normal sized heart. Post CABG changes. Decreased inspiration with
minimal bibasilar atelectasis. Atheromatous arterial calcifications.
Stable right basilar scarring. Stable prominence of the interstitial
markings.
IMPRESSION: 1. Poor inspiration with minimal bibasilar atelectasis.
2. Stable changes of COPD with right basilar scarring.

## 2014-04-16 IMAGING — CT CT CTA ABD/PEL W/CM AND/OR W/O CM
2 of 4 series · 15 of 46 positions shown, 17 images · IV contrast (omnipaque)
Comparison: Standard CT of the abdomen and pelvis with contrast on
07/18/2013.

CLINICAL DATA: Epigastric abdominal pain, nausea, vomiting and
weight loss. Endoscopy on [DATE] demonstrates severe erosive
ulcerative gastritis as well as duodenitis and jejunitis.

EXAM:
CT ANGIOGRAPHY ABDOMEN AND PELVIS WITH CONTRAST AND WITHOUT CONTRAST
TECHNIQUE: Multidetector CT imaging of the abdomen and pelvis was performed
using the standard protocol during bolus administration of
intravenous contrast. Multiplanar reconstructed images including
MIPs were obtained and reviewed to evaluate the vascular anatomy.
CONTRAST:  100mL OMNIPAQUE IOHEXOL 350 MG/ML SOLN

[Series 5: arterial st · axial · arterial · 0.78mm/px · z∈[-427,-22]mm · 12 of 188 slices shown, 14 images]
[im 13/188  soft-tissue]
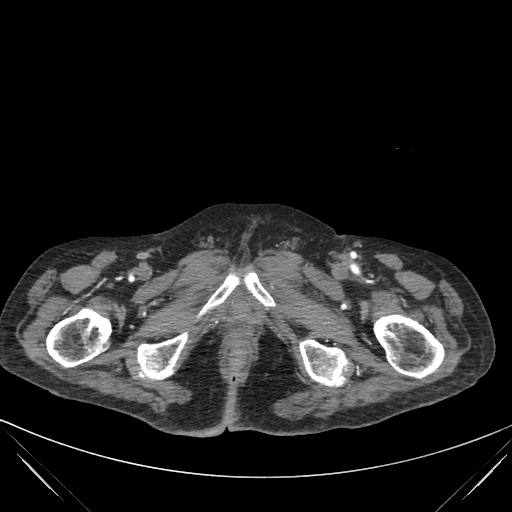
[im 13/188  bone]
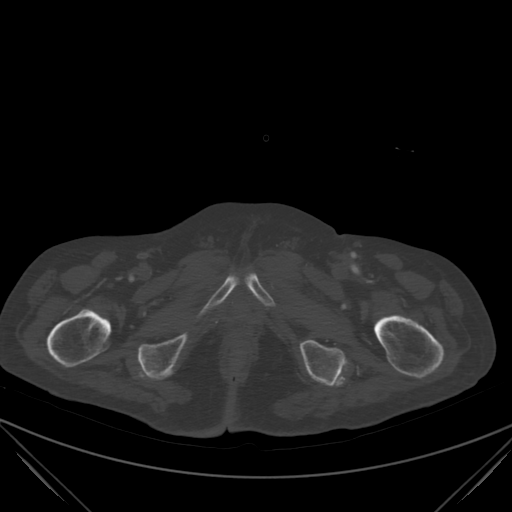
[im 25/188  soft-tissue]
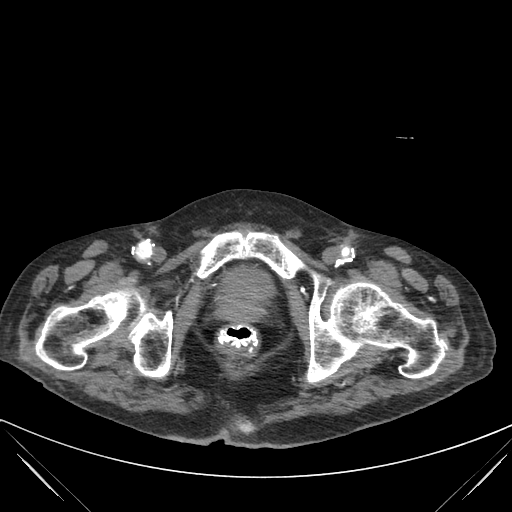
[im 44/188  soft-tissue]
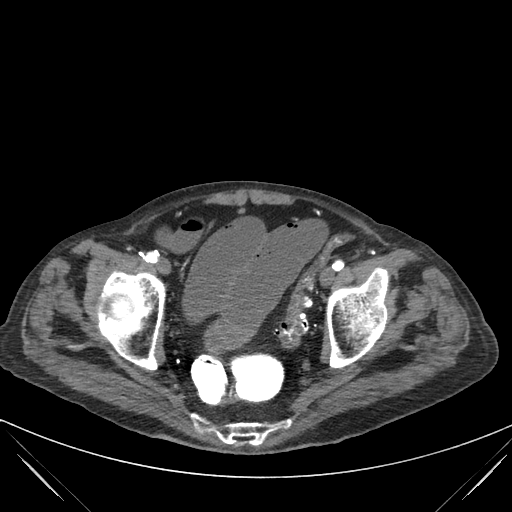
[im 57/188  soft-tissue]
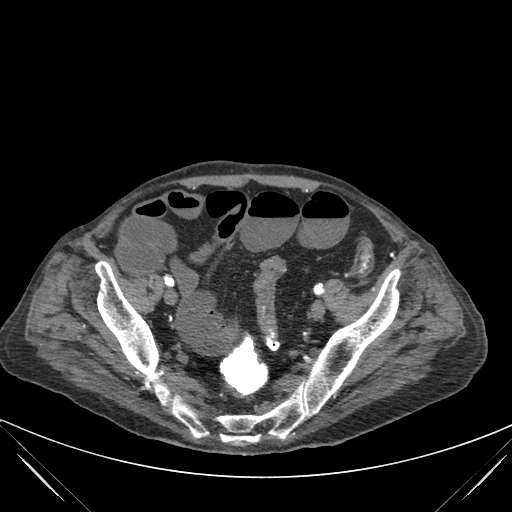
[im 69/188  soft-tissue]
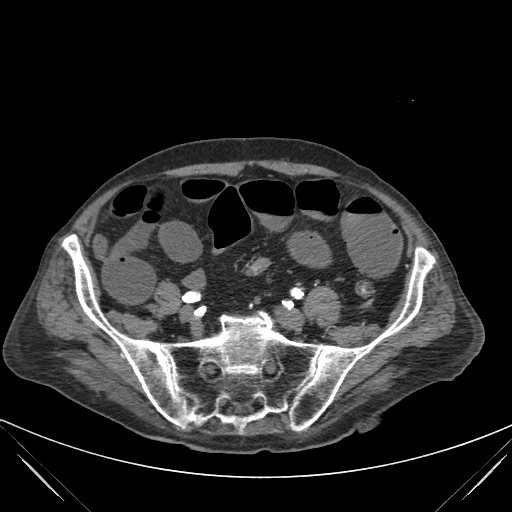
[im 88/188  soft-tissue]
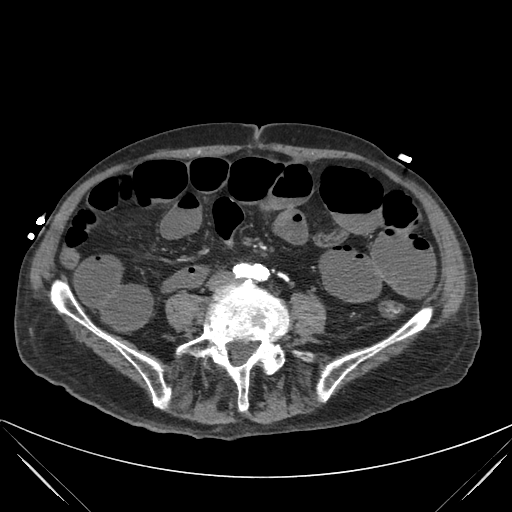
[im 100/188  soft-tissue]
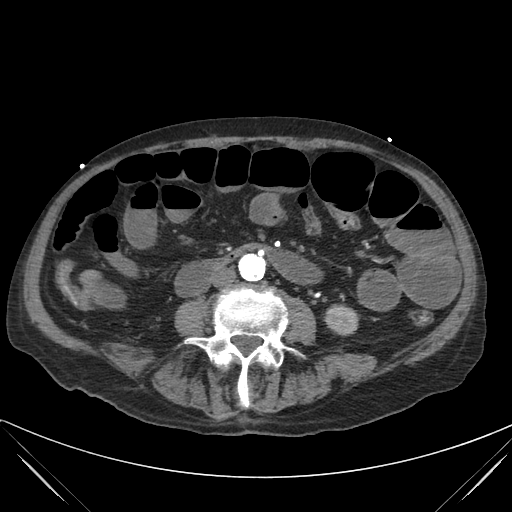
[im 119/188  soft-tissue]
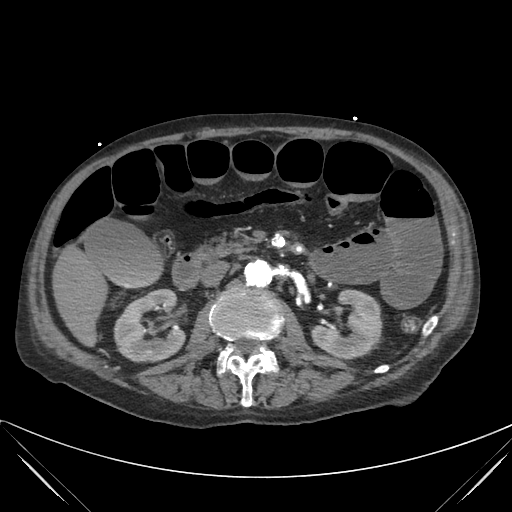
[im 131/188  soft-tissue]
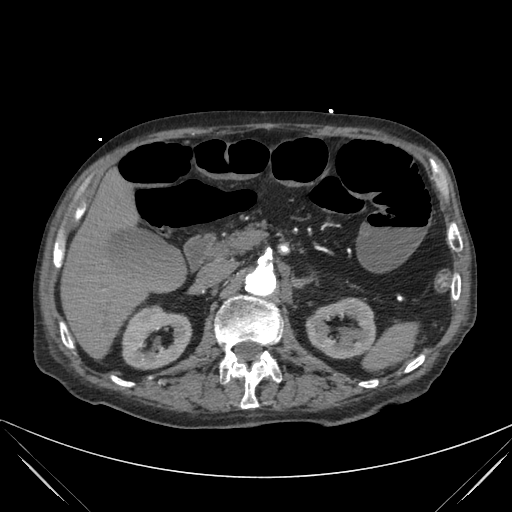
[im 131/188  bone]
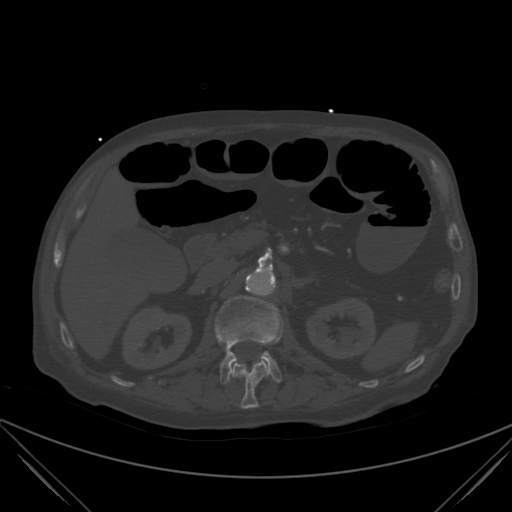
[im 144/188  soft-tissue]
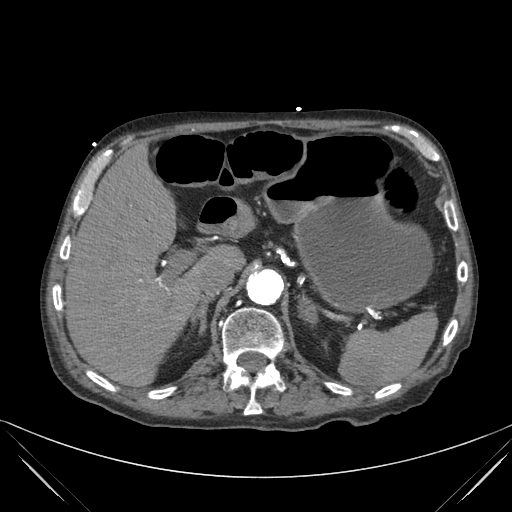
[im 163/188  soft-tissue]
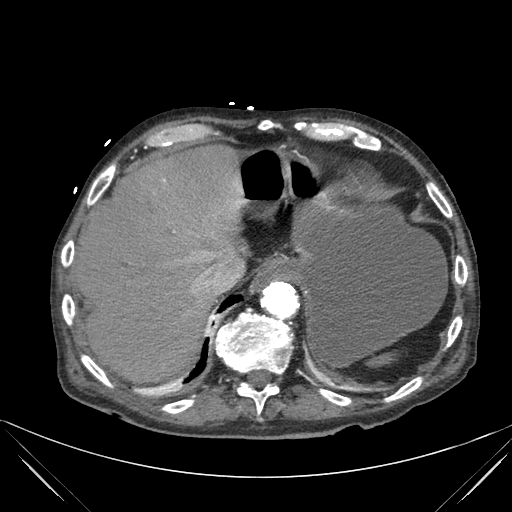
[im 175/188  soft-tissue]
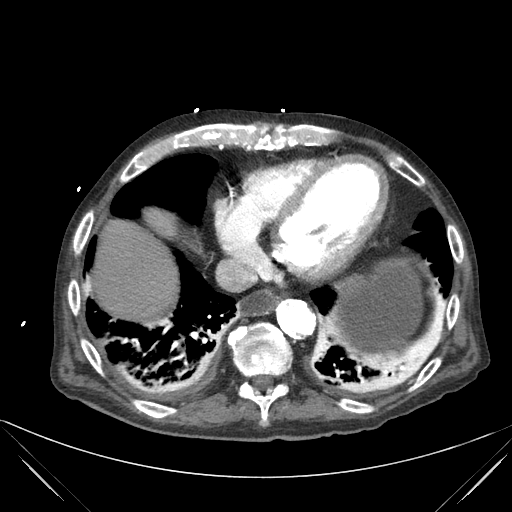

[mpr, coronal art, coronal · coronal · 0.91mm/px · 3 of 90 slices shown]
[im 30/90  soft-tissue]
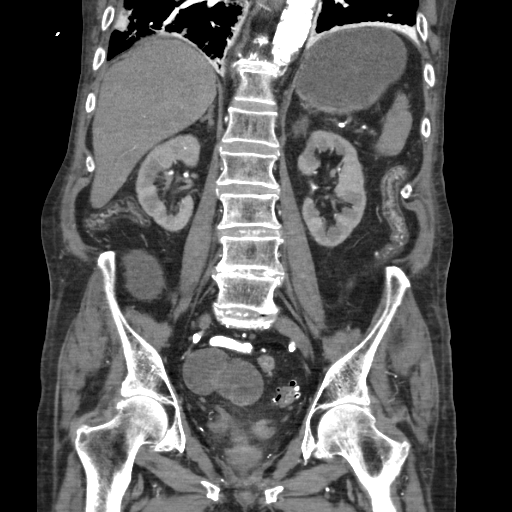
[im 40/90  soft-tissue]
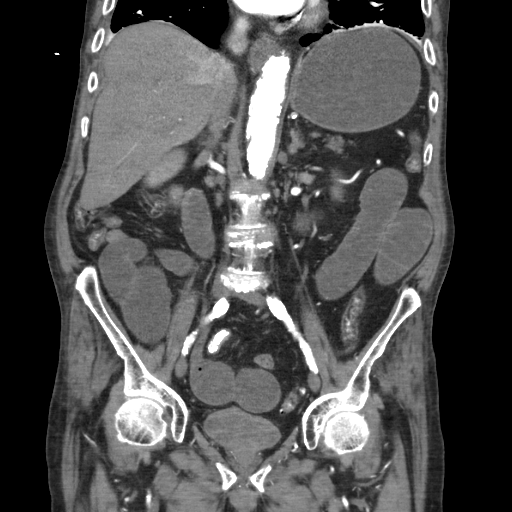
[im 50/90  soft-tissue]
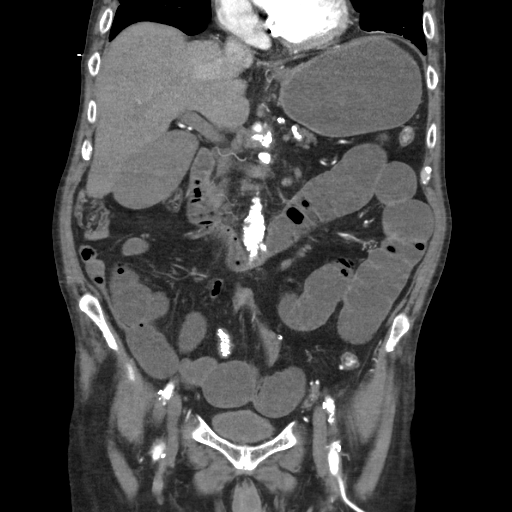

[15 of 46 positions shown; findings below may reference images not displayed]

FINDINGS: The aorta and major visceral branches are heavily calcified with
diffuse atherosclerosis present. The origin of the celiac axis shows
critical narrowing and likely near subtotal occlusive disease.
Distal branches are opacified and the celiac trunk is likely not
completely occluded.

The proximal superior mesenteric artery shows heavily calcified
plaque extending for a long distance down the trunk of the SMA. The
proximal SMA trunk is completely occluded with reconstitution more
distally by jejunal branches related to collateral reconstitution
from celiac and IMA supplied. The inferior mesenteric artery origin
is heavily calcified but open.

Bilateral renal artery stents are identified which are open.
Bilateral common iliac artery stents are also present which are
open. The external iliac arteries are diffusely diseased with
heavily calcified plaque.

There is likely critical stenosis and near subtotal occlusion at the
level of the mid right external iliac artery. Moderate narrowing of
the distal external iliac artery is also present just above the
inguinal ligament approaching 70-75% narrowing. At the level of the
right groin, postsurgical changes are seen likely related to prior
femoral bypass with an occluded proximal bypass graft present. The
native SFA is also occluded at its origin.

Diffuse disease of the left external iliac artery present without
significant stenosis. The common femoral artery shows irregular
plaque causing approximately 50% narrowing just below the inguinal
ligament. Postsurgical changes are seen in the left groin with an
open proximal bypass graft identified in the anterior thigh. The
native SFA is occluded at its origin.

Nonvascular evaluation shows significant interval distension of
small bowel loops which are diffusely dilated and fluid-filled.
There may be some pneumatosis in the transverse duodenum and
proximal jejunum as well, concerning for potential evolution of
bowel ischemia to necrosis. The colon is completely decompressed.
There is no evidence of free intraperitoneal air or focal abscess.
The gallbladder shows some increased distention since the prior
study. There is no evidence of overt biliary obstruction, however.
Stable left adrenal mass identified. This is fairly low density and
likely an incidental adenoma.

Review of the MIP images confirms the above findings.
IMPRESSION: 1. Critical stenosis and likely subtotal occlusion of the proximal
celiac axis.
2. Heavily calcified and completely occluded proximal trunk of the
superior mesenteric artery which is occluded over a fairly long
segment with distal reconstitution by jejunal branches.
3. Heavily calcified but open inferior mesenteric artery.
4. Significant stenoses of the right external iliac artery.
Postsurgical changes are evident in the right groin with occlusion
of a previously placed proximal bypass graft.
5. Open left femoral bypass graft.
6. Significant interval distension of small bowel loops which are
diffusely dilated and filled with fluid. There also may be a
pneumatosis in the duodenum and proximal jejunum concerning for
severe ischemia/necrosis. Surgical consultation would be
recommended.
Critical Value/emergent results were called by telephone at the time
of interpretation on 07/21/2013 at [DATE] to Dr.Ryo Beaubrun, who
verbally acknowledged these results.

## 2014-04-16 IMAGING — CR DG CHEST 1V PORT
1 series · 1 of 1 positions shown · non-contrast
Comparison: 07/18/2013

CLINICAL DATA: Endotracheal tube and line placement

EXAM:
PORTABLE CHEST - 1 VIEW

[AP]
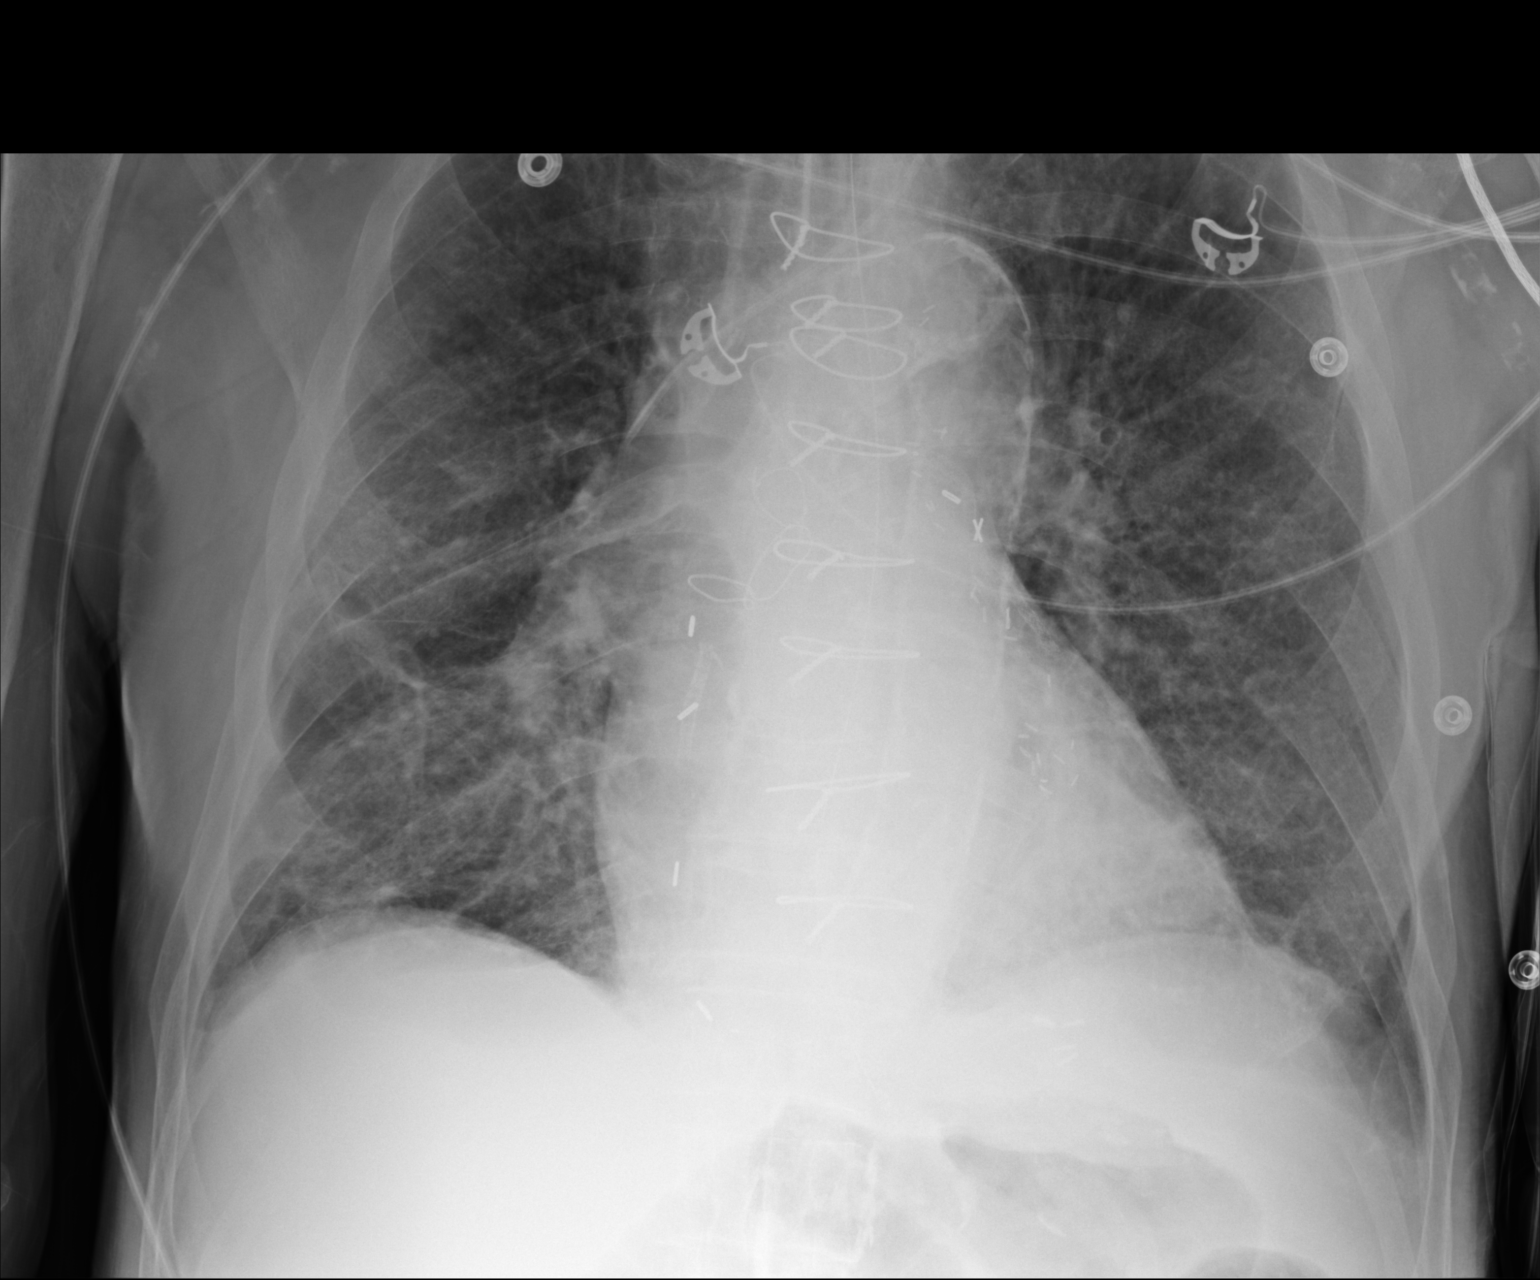

[1 of 1 positions shown; findings below may reference images not displayed]

FINDINGS: Endotracheal tube tip lies 1.5 cm above the carina. A left internal
jugular central venous line tip lies in the mid superior vena cava.
A nasogastric tube has also been placed. Its tip lies at the GE
junction. It will need to be further inserted to allow the tip to
fully into the stomach.

No pneumothorax evident on this semi-erect study.

Lung hyperexpansion, chronic interstitial thickening and areas of
lung scarring and probable basilar subsegmental atelectasis are
stable. No area of focal consolidation and no overt pulmonary edema.
IMPRESSION: 1. Endotracheal tube and left internal jugular central venous line
are well positioned. No pneumothorax.
2. Nasogastric tube tip lies at the GE junction. Recommend further
inserting tube 10-15 cm to allow the tip to fully into the stomach.
3. No change in the appearance of the lungs when allowing for
differences in technique and patient positioning.

## 2014-04-17 IMAGING — CR DG CHEST 1V PORT
1 series · 1 of 1 positions shown · non-contrast
Comparison: 07/21/2013.

CLINICAL DATA: Status post laparotomy and small bowel resection for
bowel infarction.

EXAM:
PORTABLE CHEST - 1 VIEW

[AP]
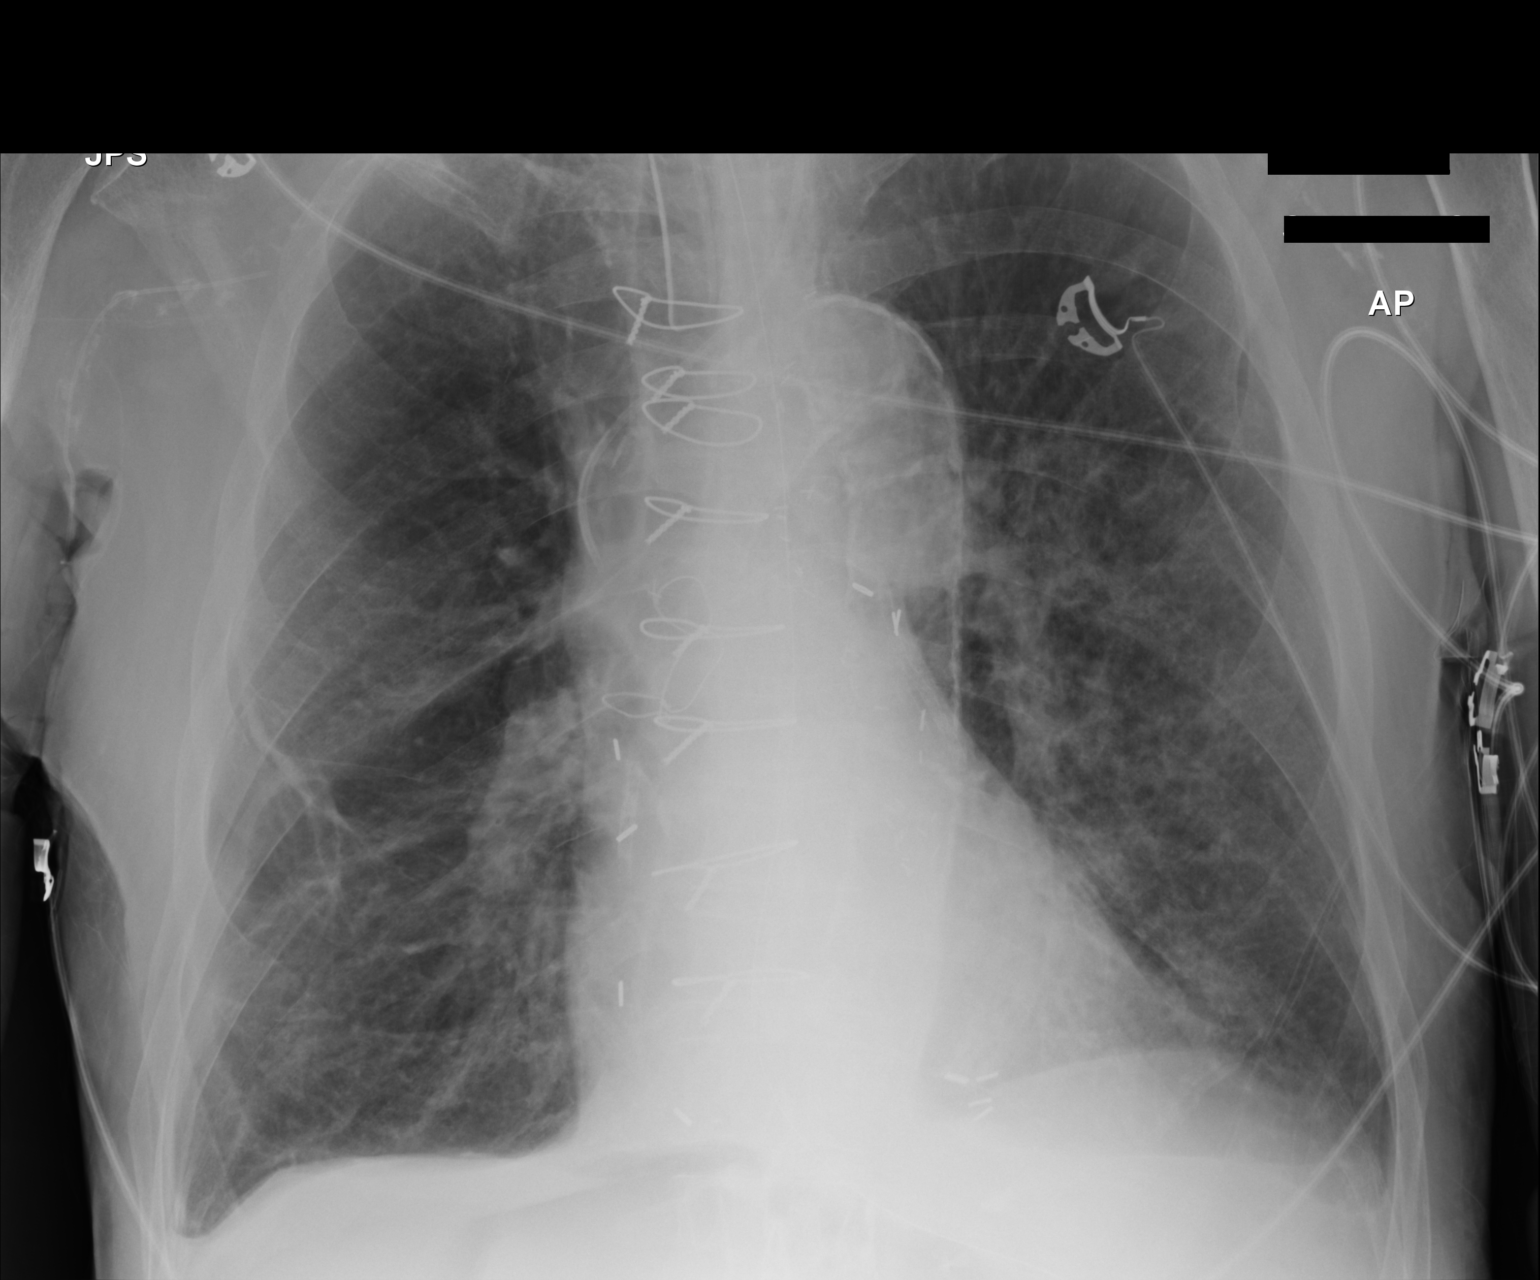

[1 of 1 positions shown; findings below may reference images not displayed]

FINDINGS: Endotracheal tube present with the tip approximately 5 cm above the
carina. Central line tip lies in the upper SVC. Lungs show stable
chronic disease without evidence of overt edema or airspace
consolidation. The heart size is stable and within normal limits.
IMPRESSION: Stable chronic lung disease.

## 2014-04-18 IMAGING — CR DG CHEST 1V PORT
2 series · 2 of 2 positions shown · non-contrast
Comparison: 07/22/2013

CLINICAL DATA: Ventilated patient.  History of abdominal surgery.

EXAM:
PORTABLE CHEST - 1 VIEW

[AP (1 of 2)]
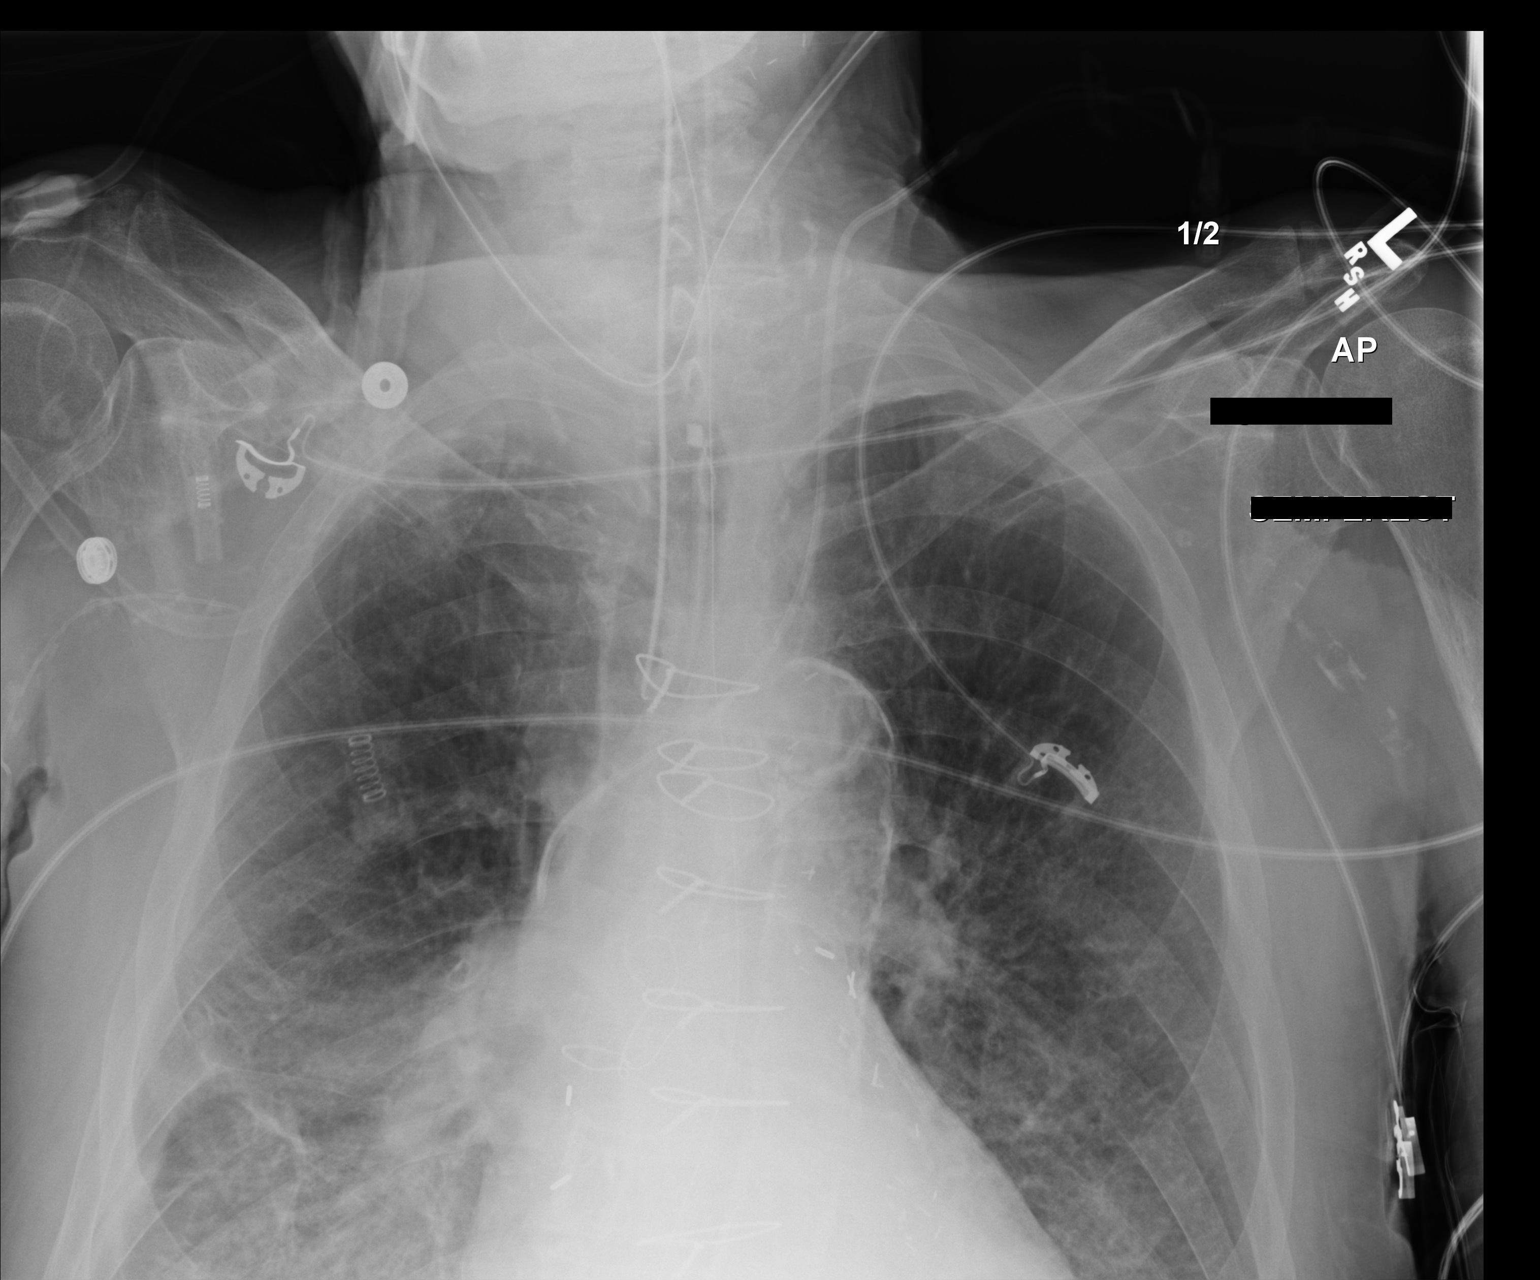

[AP (2 of 2)]
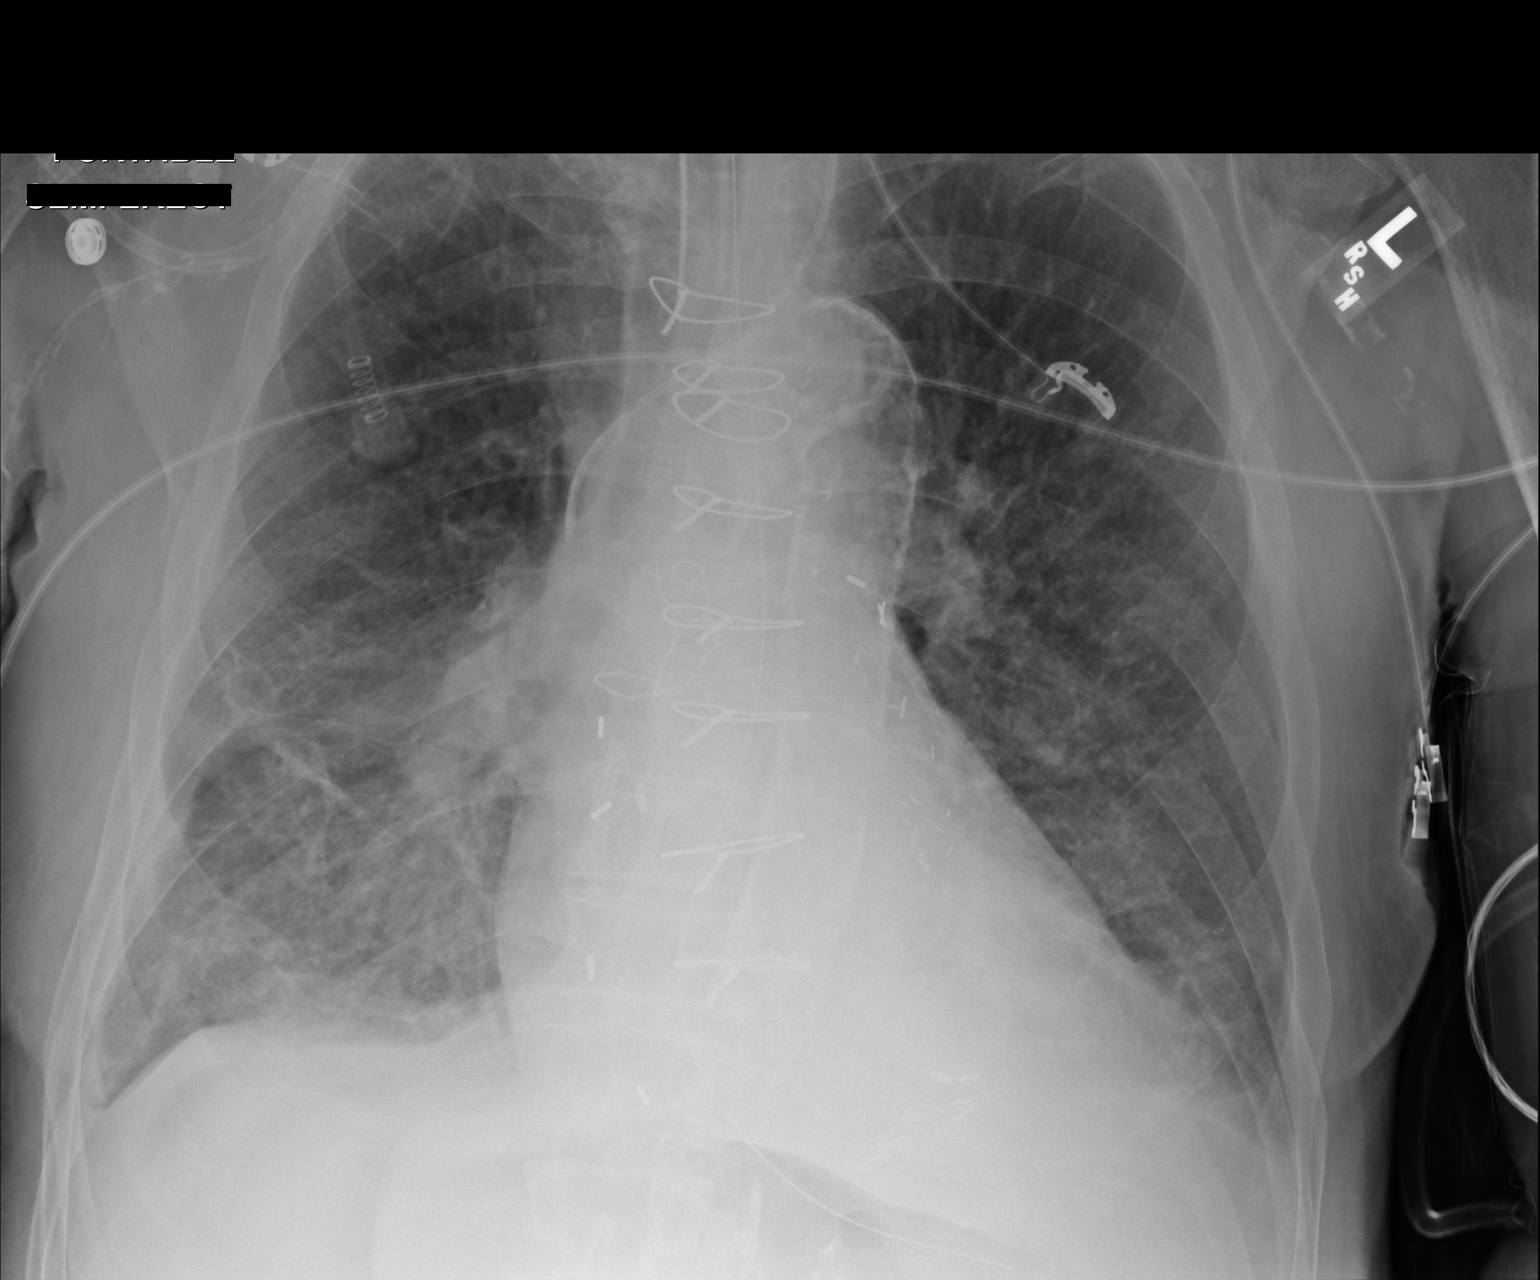

[2 of 2 positions shown; findings below may reference images not displayed]

FINDINGS: Endotracheal tube is 4.9 cm above the carina. Jugular central venous
catheter in the upper SVC region. Nasogastric tube extends into the
abdomen. Increased densities in the right lower chest could
represent developing airspace disease or asymmetric edema. Slightly
increased densities at the left lung base suggest atelectasis and
suspect small pleural effusions. Heart size is stable. No evidence
for a pneumothorax.
IMPRESSION: Increased densities in the right lower chest are concerning for
developing airspace disease or asymmetric edema.

Probable small pleural effusions.

## 2014-04-20 IMAGING — CR DG CHEST 1V PORT
2 series · 2 of 2 positions shown · non-contrast
Comparison: 07/24/2013

CLINICAL DATA: Respiratory failure and bowel resection.

EXAM:
PORTABLE CHEST - 1 VIEW

[AP (1 of 2)]
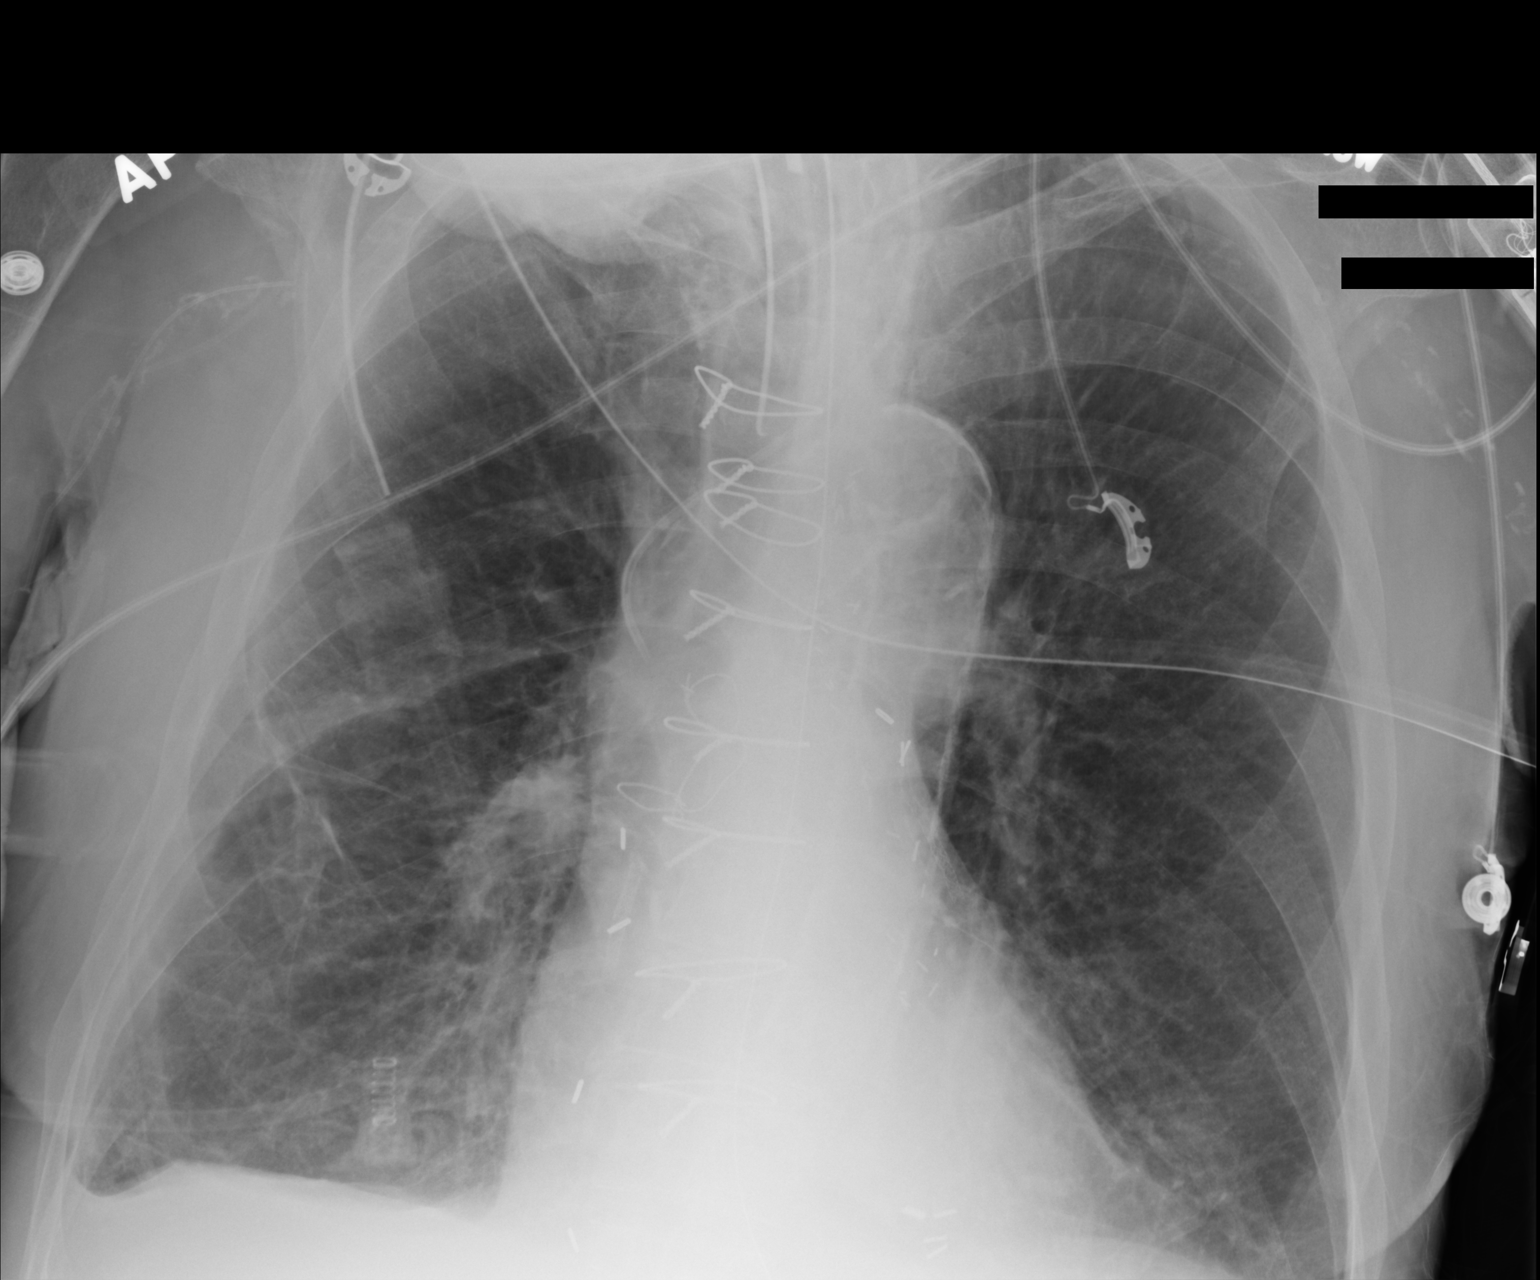

[AP (2 of 2)]
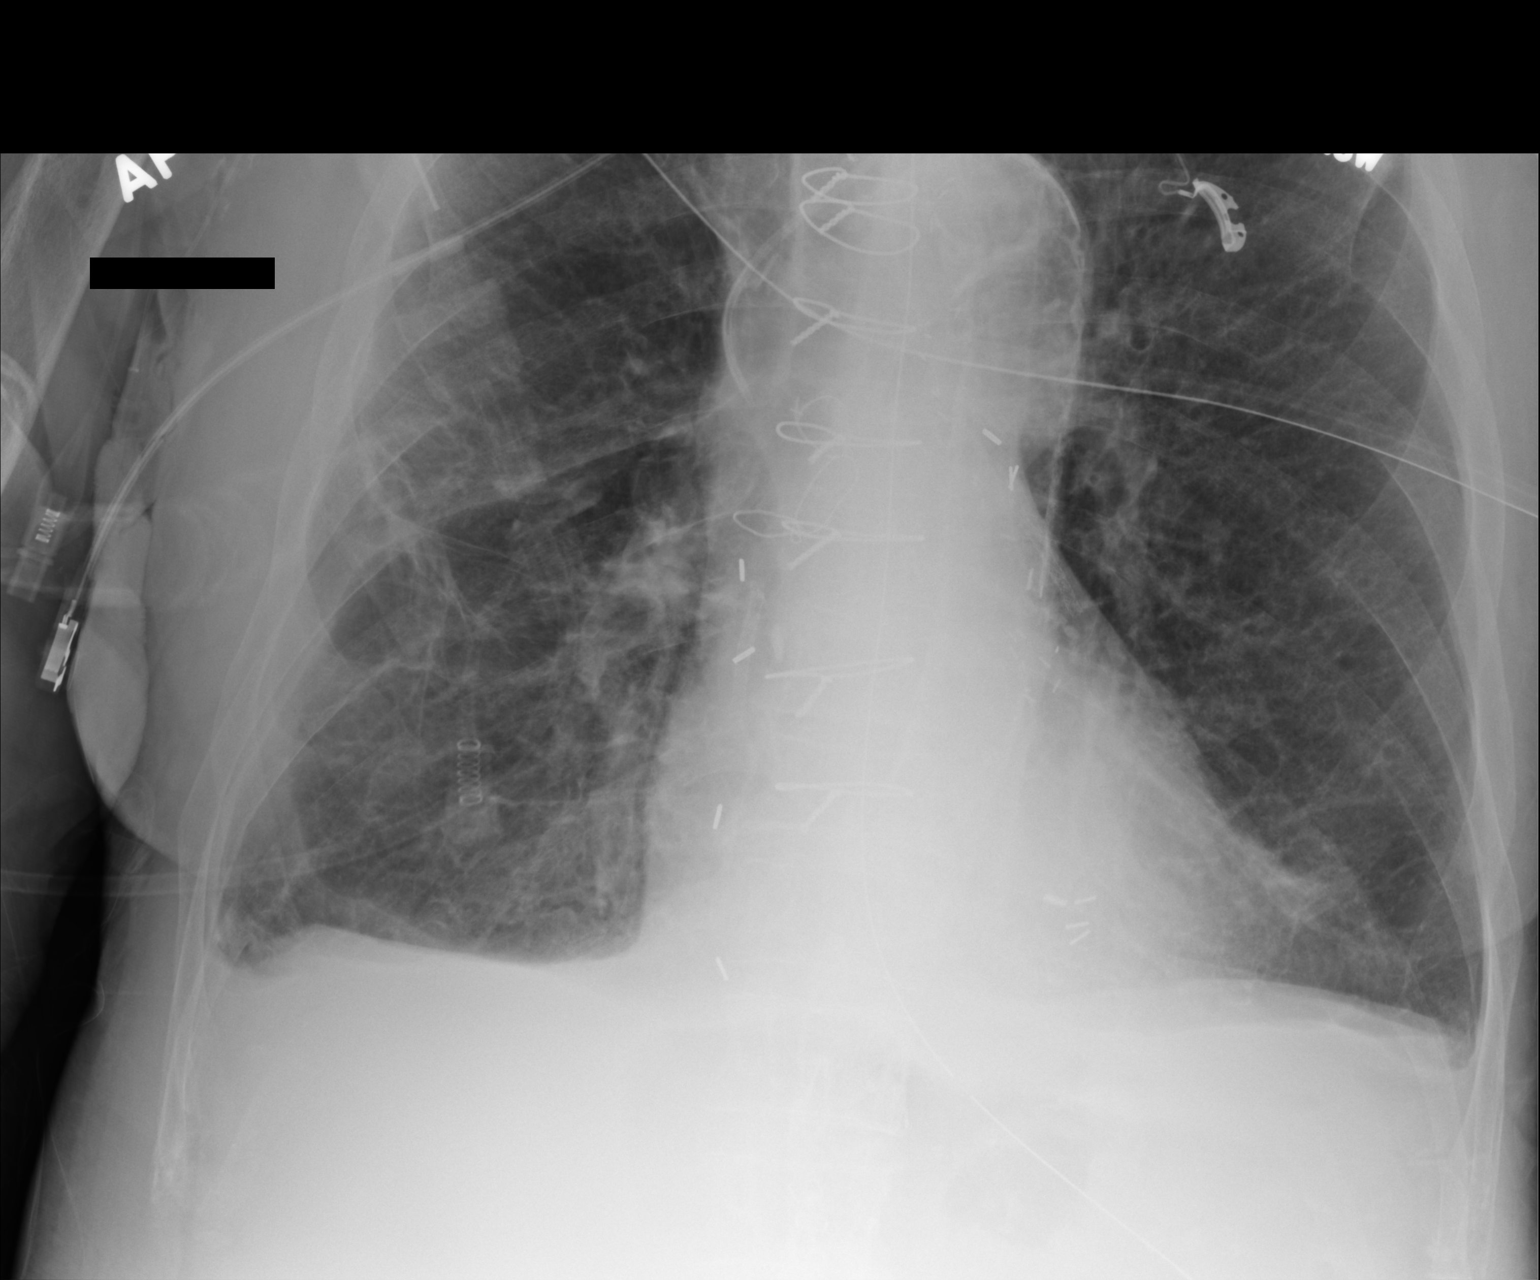

[2 of 2 positions shown; findings below may reference images not displayed]

FINDINGS: Endotracheal tube tip approximately 4.5 cm above the carina. Central
line positioning is stable. Lungs show improved aeration of both
lower lobes. Underlying chronic lung disease is stable.
IMPRESSION: Improved aeration of both lower lobes.

## 2014-04-21 IMAGING — CR DG CHEST 1V PORT
1 series · 1 of 1 positions shown · non-contrast
Comparison: 07/25/2013

CLINICAL DATA: Followup pneumonia

EXAM:
PORTABLE CHEST - 1 VIEW

[AP]
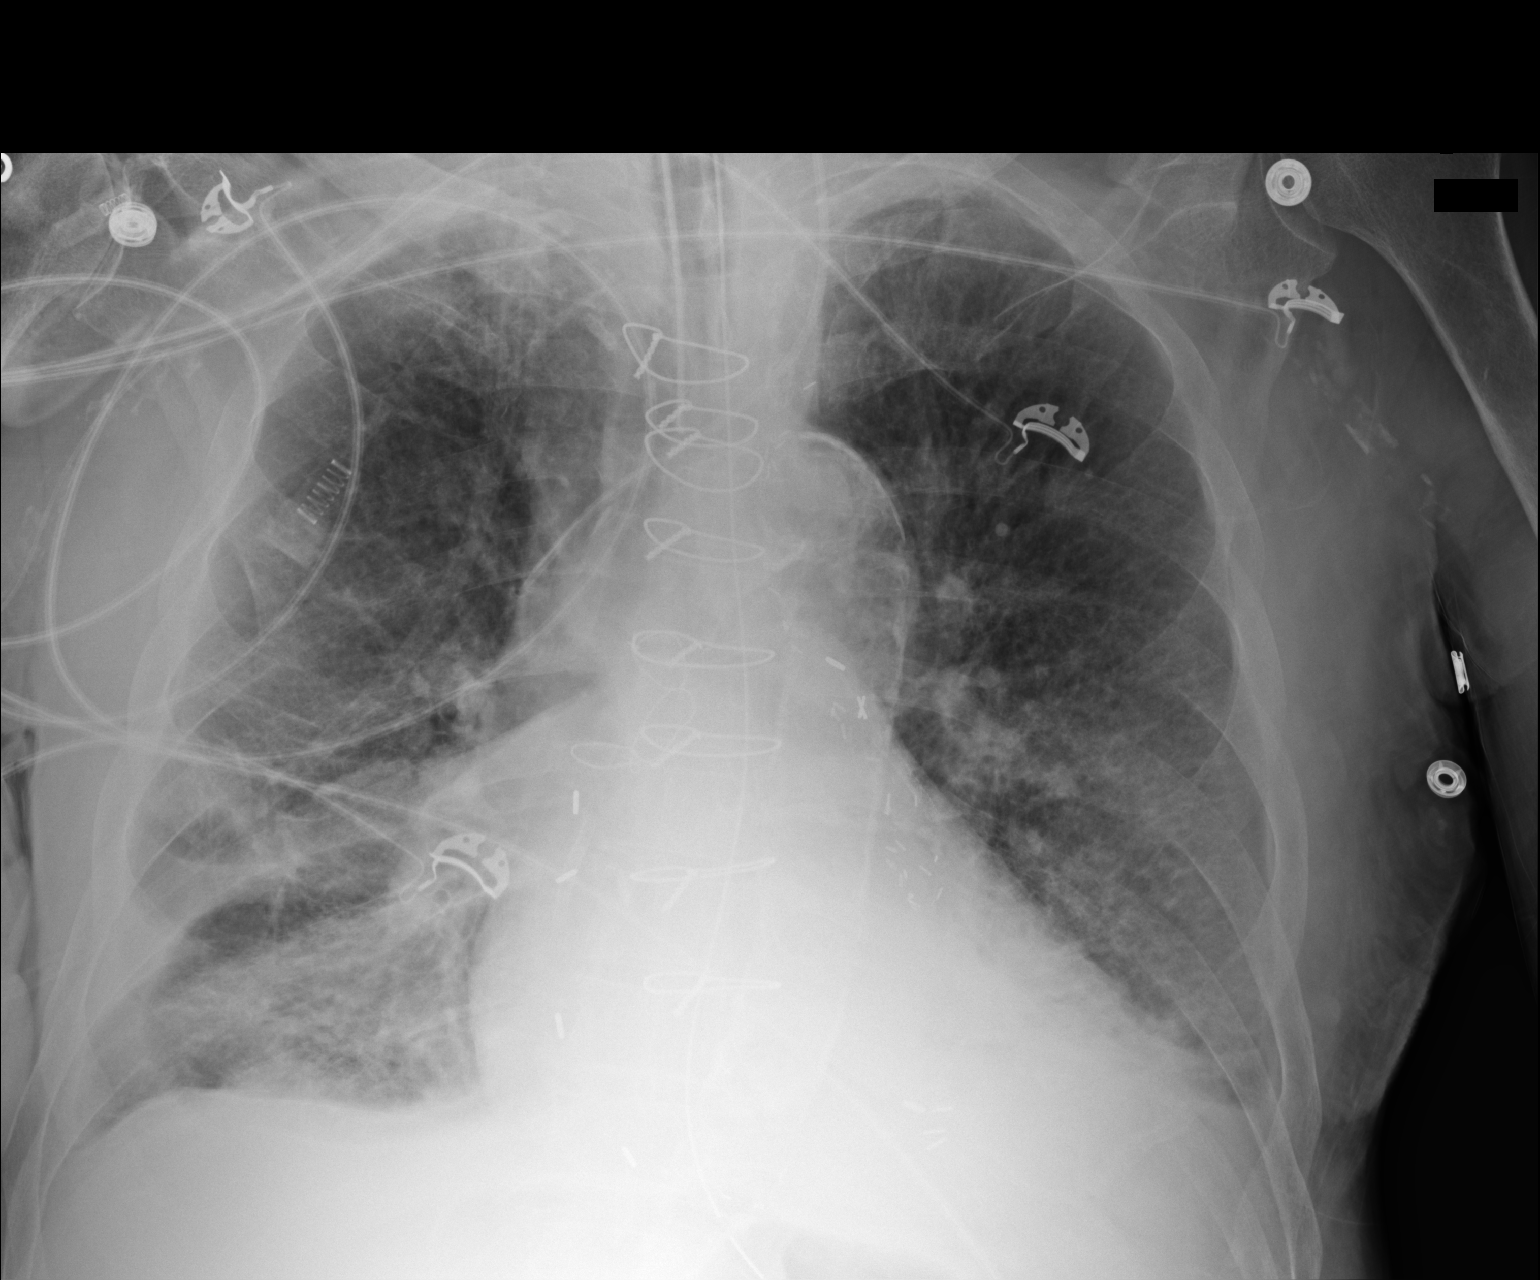

[1 of 1 positions shown; findings below may reference images not displayed]

FINDINGS: A left-sided jugular central venous line is again noted in the
proximal superior vena cava. The endotracheal tube and nasogastric
catheter are stable in appearance and within normal position. The
cardiac shadow is stable. Postsurgical changes are again noted.
Increasing effusion is noted in the right base. The lungs are
otherwise stable.
IMPRESSION: Increasing right basilar effusion.

## 2014-04-22 IMAGING — CR DG CHEST 1V PORT
1 series · 1 of 1 positions shown · non-contrast
Comparison: 07/26/2013

CLINICAL DATA: Check endotracheal tube placement

EXAM:
PORTABLE CHEST - 1 VIEW

[AP]
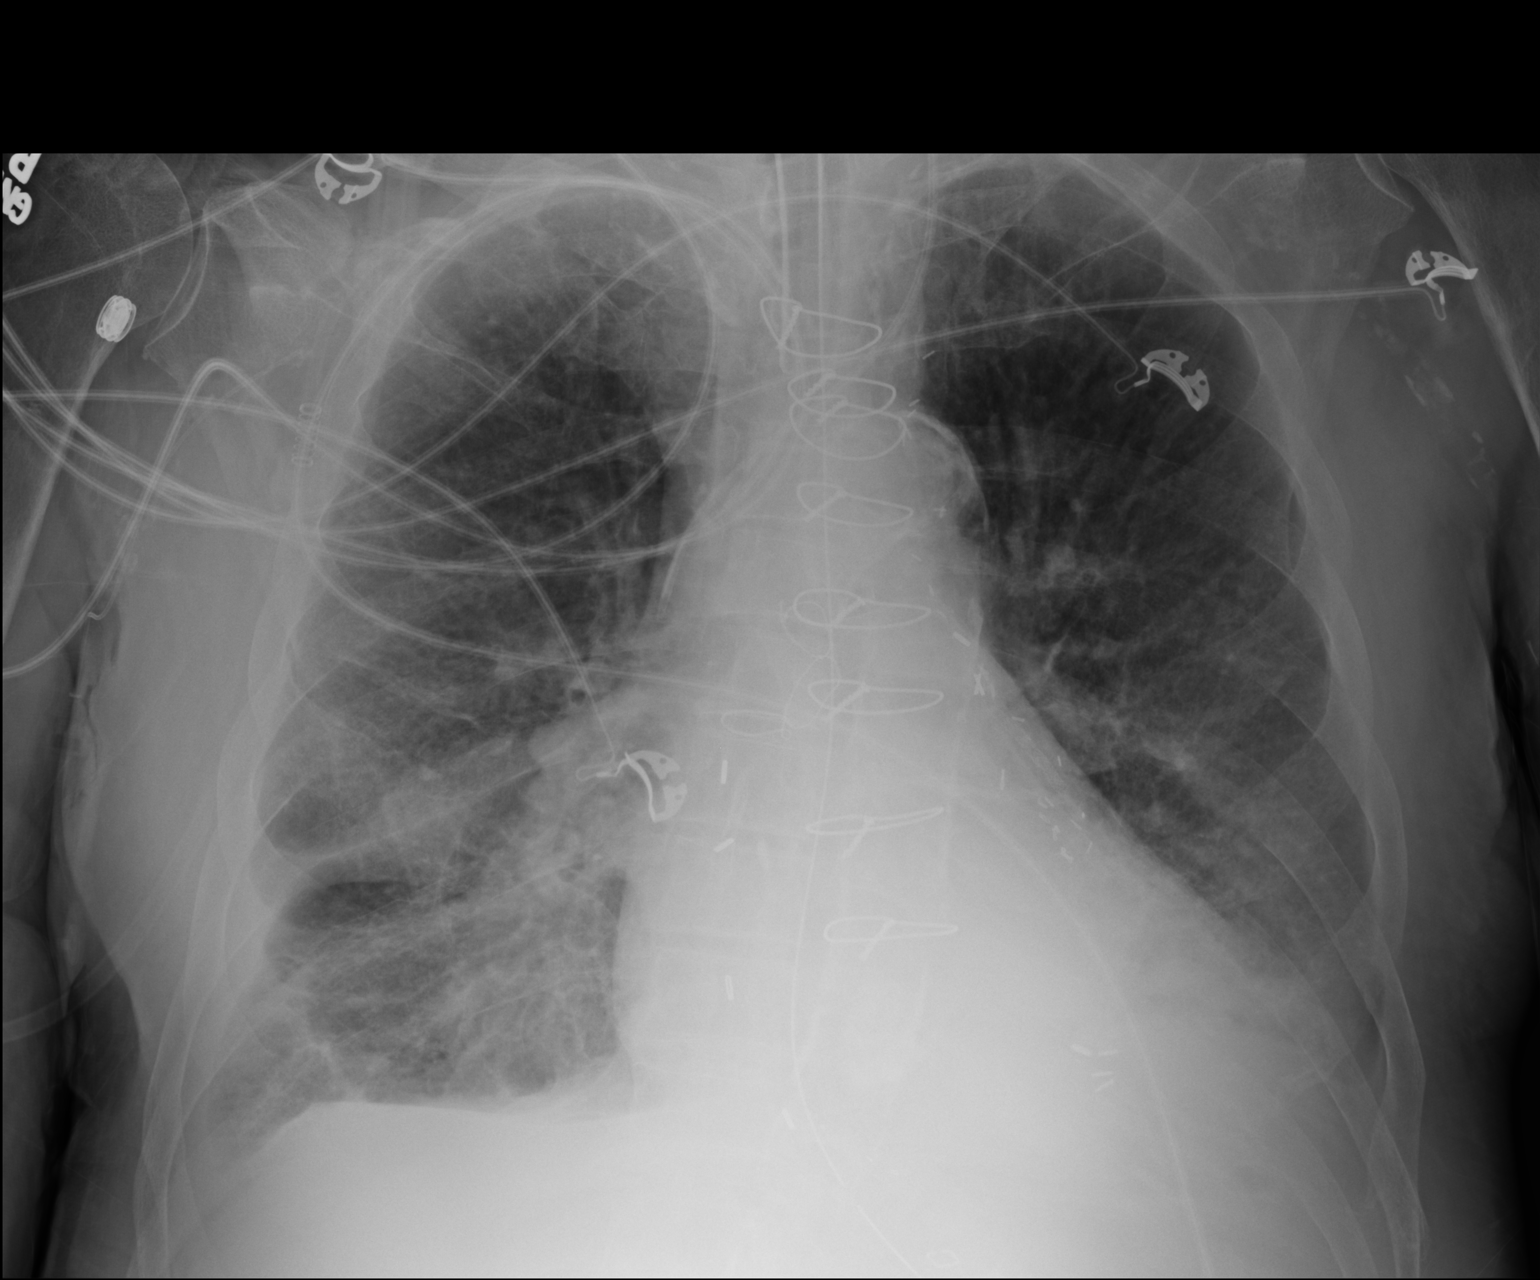

[1 of 1 positions shown; findings below may reference images not displayed]

FINDINGS: Cardiac shadow is stable. A left central venous line is again noted
in the mid superior vena cava. The endotracheal tube is noted 6.6 cm
above the carinal. A nasogastric catheter is seen within the
stomach. Stable changes are noted in the bases bilaterally. No new
focal abnormality is seen.
IMPRESSION: No significant interval change from the prior exam.

## 2014-04-22 IMAGING — CR DG CHEST 1V PORT
1 series · 1 of 1 positions shown · non-contrast
Comparison: Chest radiograph July 27, 2013 at [DATE] a.m..

CLINICAL DATA: Respiratory distress, shortness of breath.

EXAM:
PORTABLE CHEST - 1 VIEW

[AP]
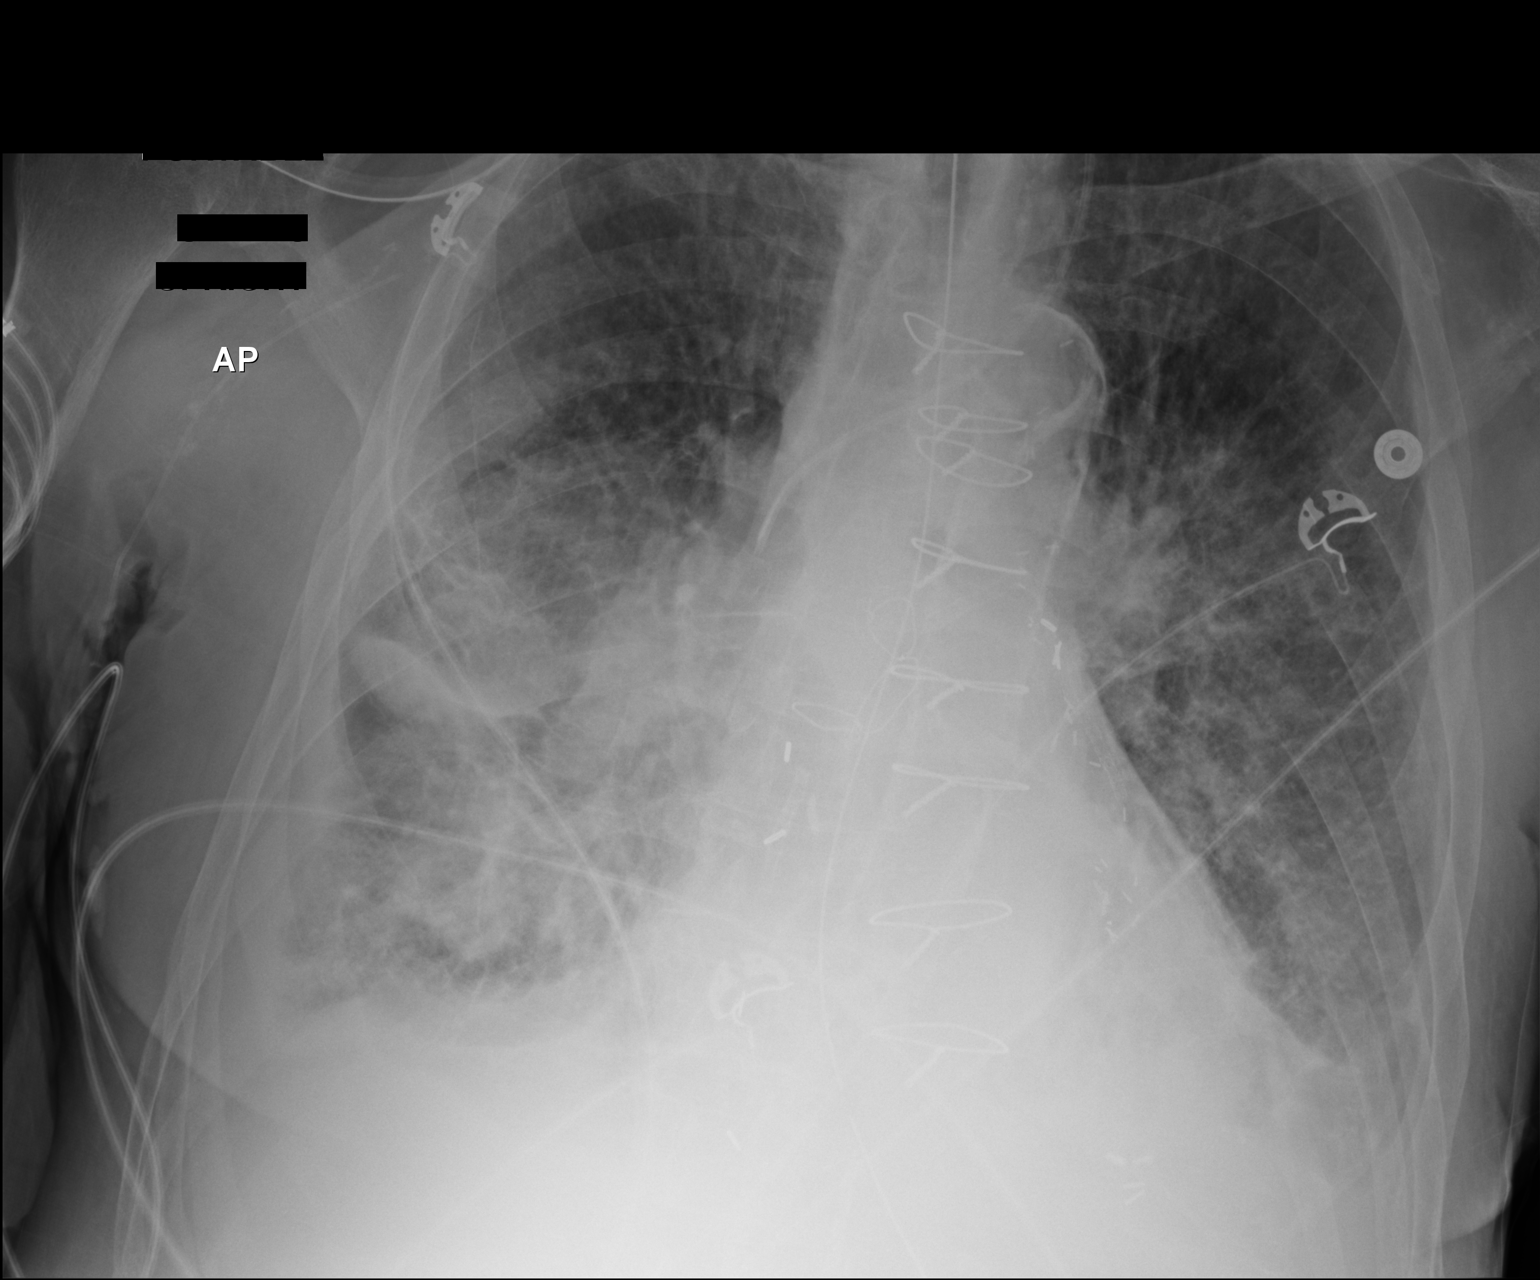

[1 of 1 positions shown; findings below may reference images not displayed]

FINDINGS: Cardiac silhouette is scratch the upper limits of normal, status
post median sternotomy for apparent Coronary artery bypass grafting.
Increasing central pulmonary vasculature congestion, worsening
interstitial prominence with new right lower lobe airspace opacity.
Small to moderate right pleural effusion extending into the fissure.
Small left pleural effusion.

Interval apparent extubation. Left internal jugular central venous
catheter with distal tip projecting in proximal superior vena cava.
Nasogastric tube in place with side port projecting at
gastroesophageal junction. Right upper extremity catheter may
reflect a PICC, distal tip appears to terminate in the right axilla
and was present previously. Multiple EKG lines overlie the patient
and may obscure subtle underlying pathology.
IMPRESSION: Worsening interstitial and alveolar airspace opacities may reflect
fluid overload, with increasing small to moderate right, small left
pleural effusions.

Apparent interval extubation without change in remaining life
support lines.

  By: Saul Rodrigo Ambris

## 2014-04-23 IMAGING — CR DG ABD PORTABLE 1V
1 series · 1 of 1 positions shown · non-contrast
Comparison: 07/21/2013 CT.

CLINICAL DATA: Nasogastric tube placement.

EXAM:
PORTABLE ABDOMEN - 1 VIEW

[AP]
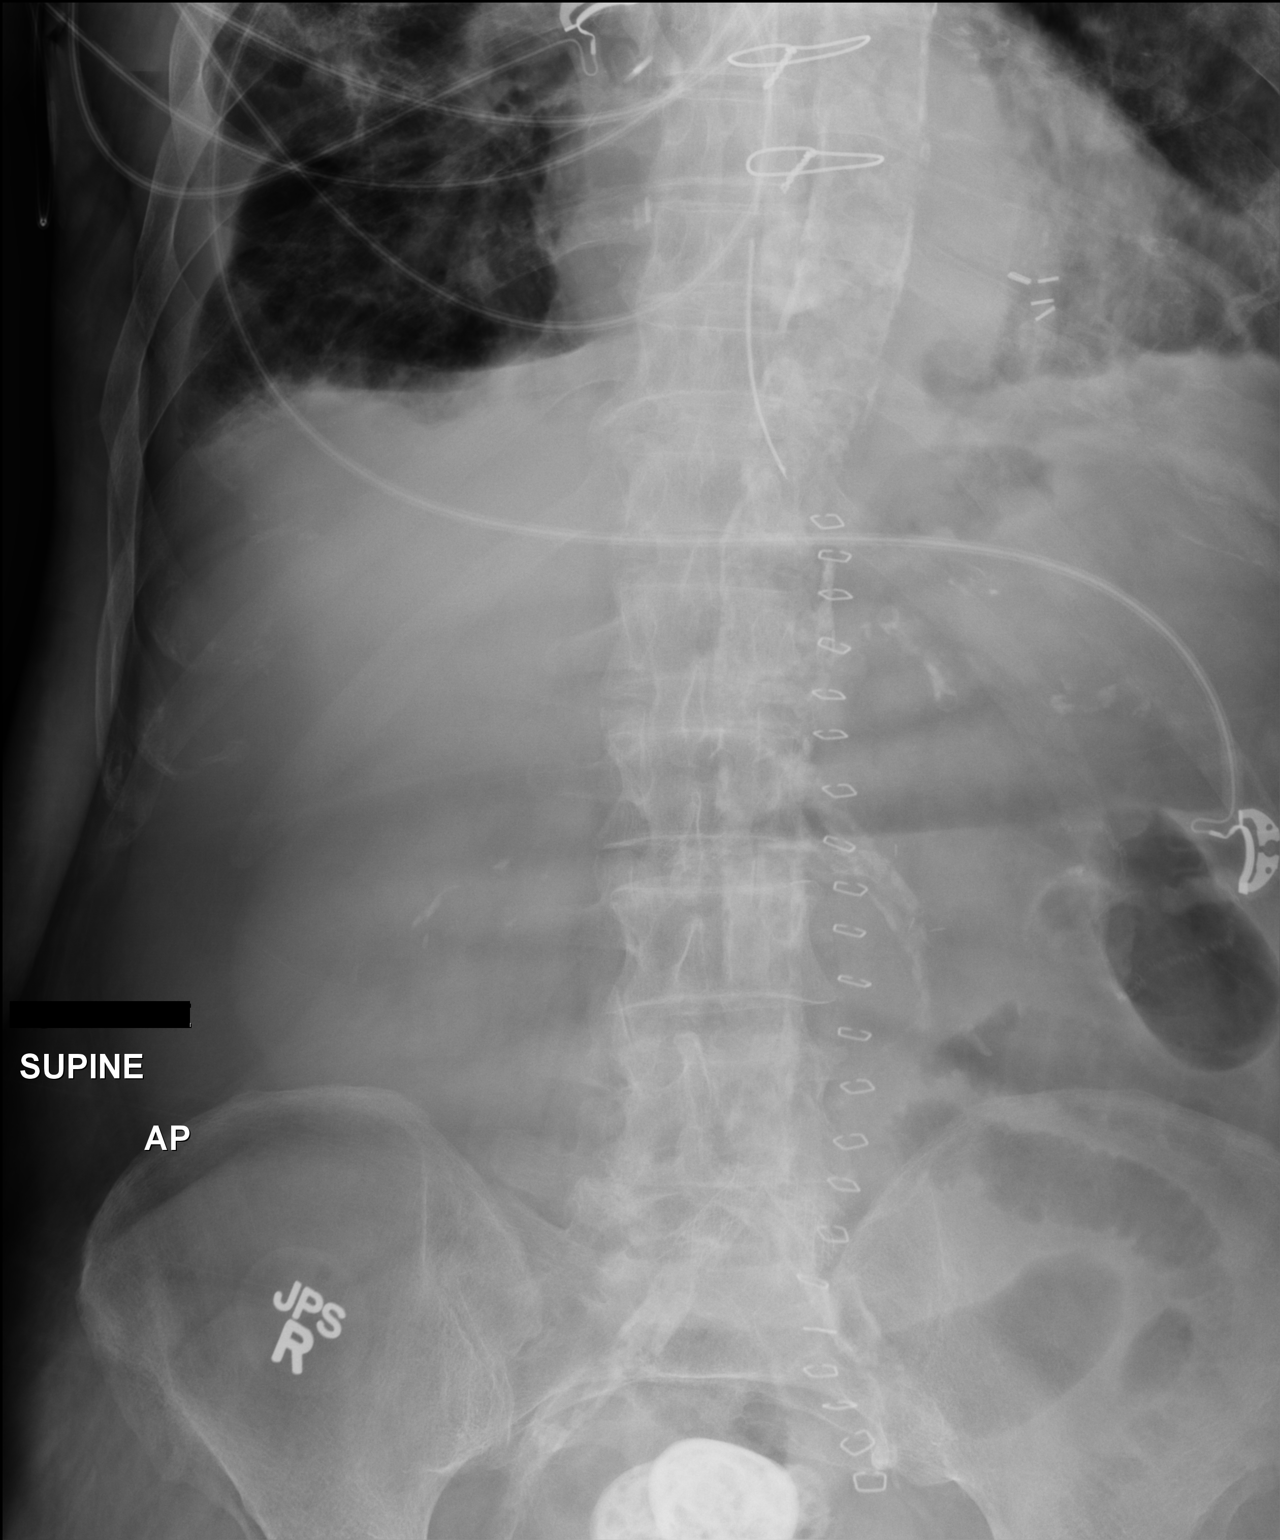

[1 of 1 positions shown; findings below may reference images not displayed]

FINDINGS: Nasogastric tube tip distal esophagus level. This needs to be
advanced.

Gas-filled slightly dilated small bowel loops. This represents a
significant improvement from the prior CT. Patient has had interval
surgery.

Basilar atelectasis.

Calcified aorta branch vessels.  Common iliac stents in place.

Residual barium in the rectum.

The possibility of free intraperitoneal air cannot be assessed on a
supine view.
IMPRESSION: Nasogastric tube tip distal esophagus level. This needs to be
advanced.

Please see above.

This is a call report.

## 2014-04-24 IMAGING — CR DG CHEST 1V PORT
1 series · 1 of 1 positions shown · non-contrast
Comparison: 07/29/2013

CLINICAL DATA: Evaluate endotracheal tube placement.

EXAM:
PORTABLE CHEST - 1 VIEW

[AP]
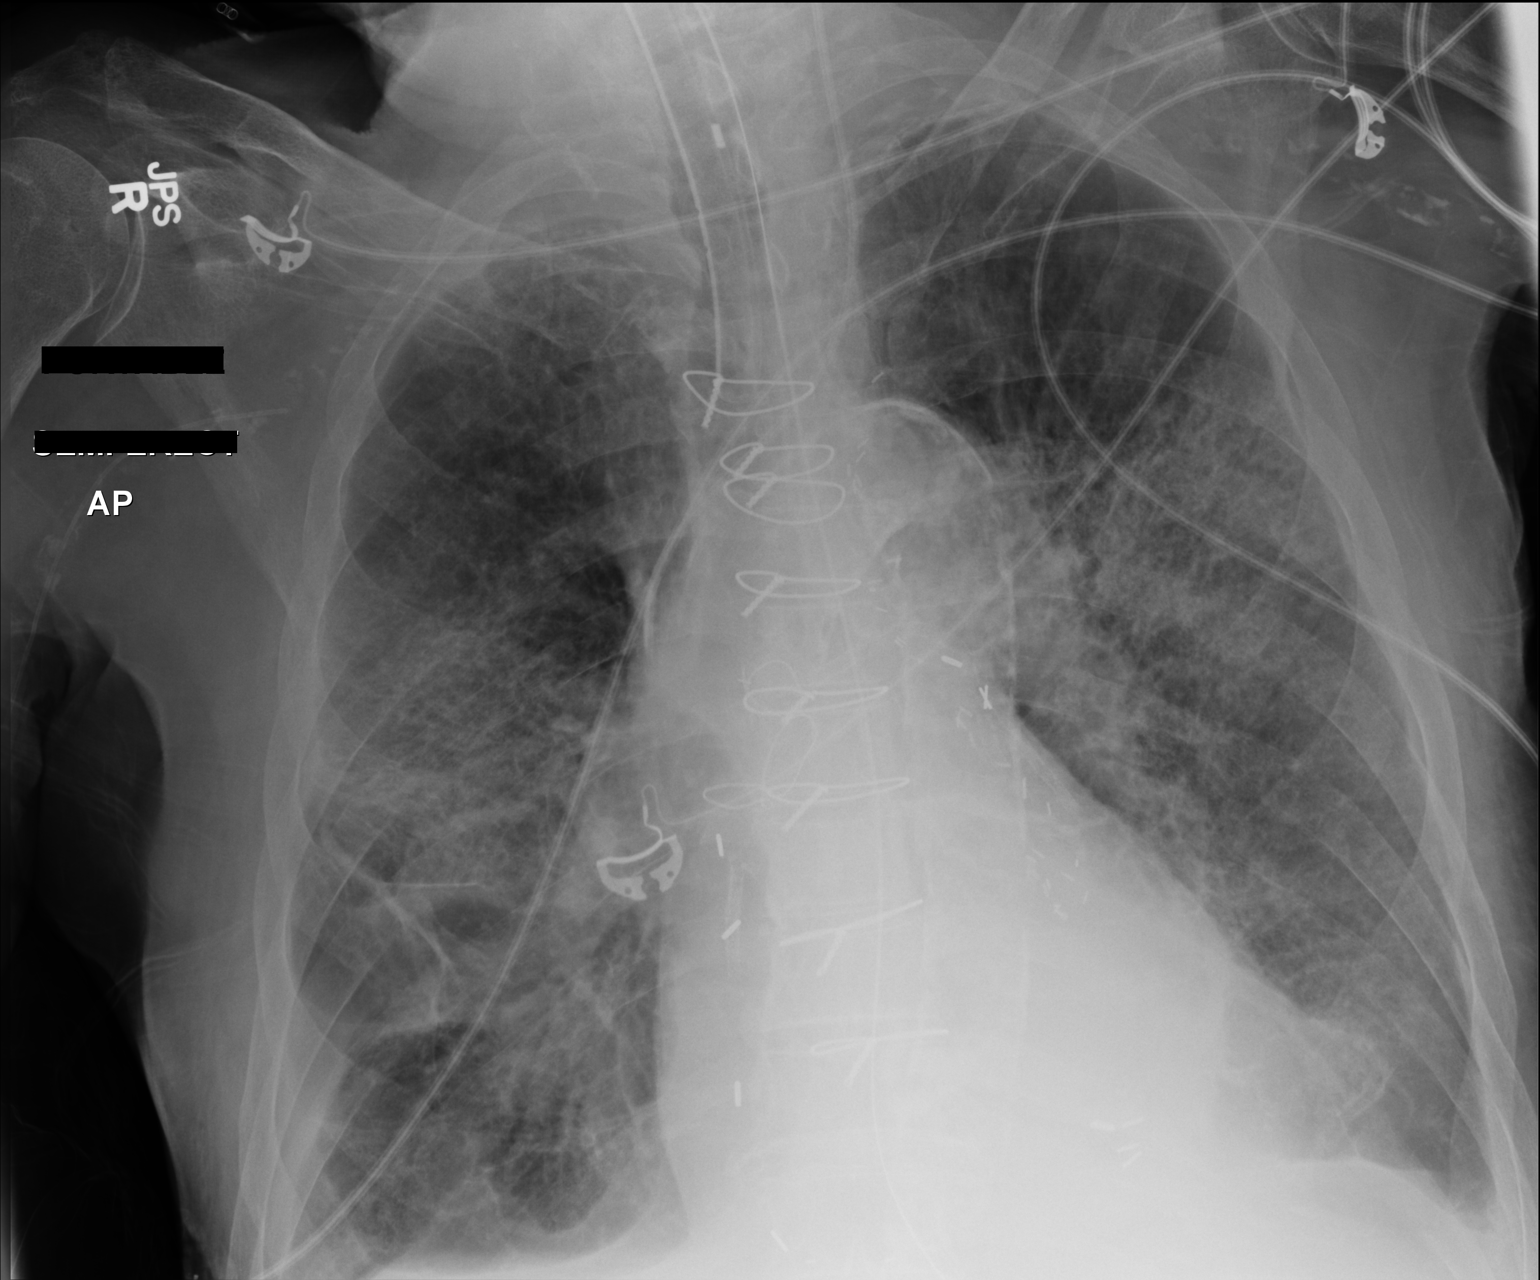

[1 of 1 positions shown; findings below may reference images not displayed]

FINDINGS: Endotracheal tube is 5.1 cm above the carina. Nasogastric tube
extends into the abdomen but the tip is beyond the image. Jugular
central venous catheter tip in the upper SVC region. Heart size is
grossly normal. The thoracic aorta is heavily calcified. No change
in the bilateral patchy airspace disease. Probable pleural fluid at
the lung bases.
IMPRESSION: The endotracheal tube is appropriately position.

Minimal change in the bilateral parenchymal lung disease. Probable
bilateral pleural effusions.

Atherosclerotic disease.

## 2014-04-24 IMAGING — CR DG CHEST 1V PORT
1 series · 1 of 1 positions shown · non-contrast
Comparison: 07/27/2013

CLINICAL DATA: Respiratory distress.

EXAM:
PORTABLE CHEST - 1 VIEW

[AP]
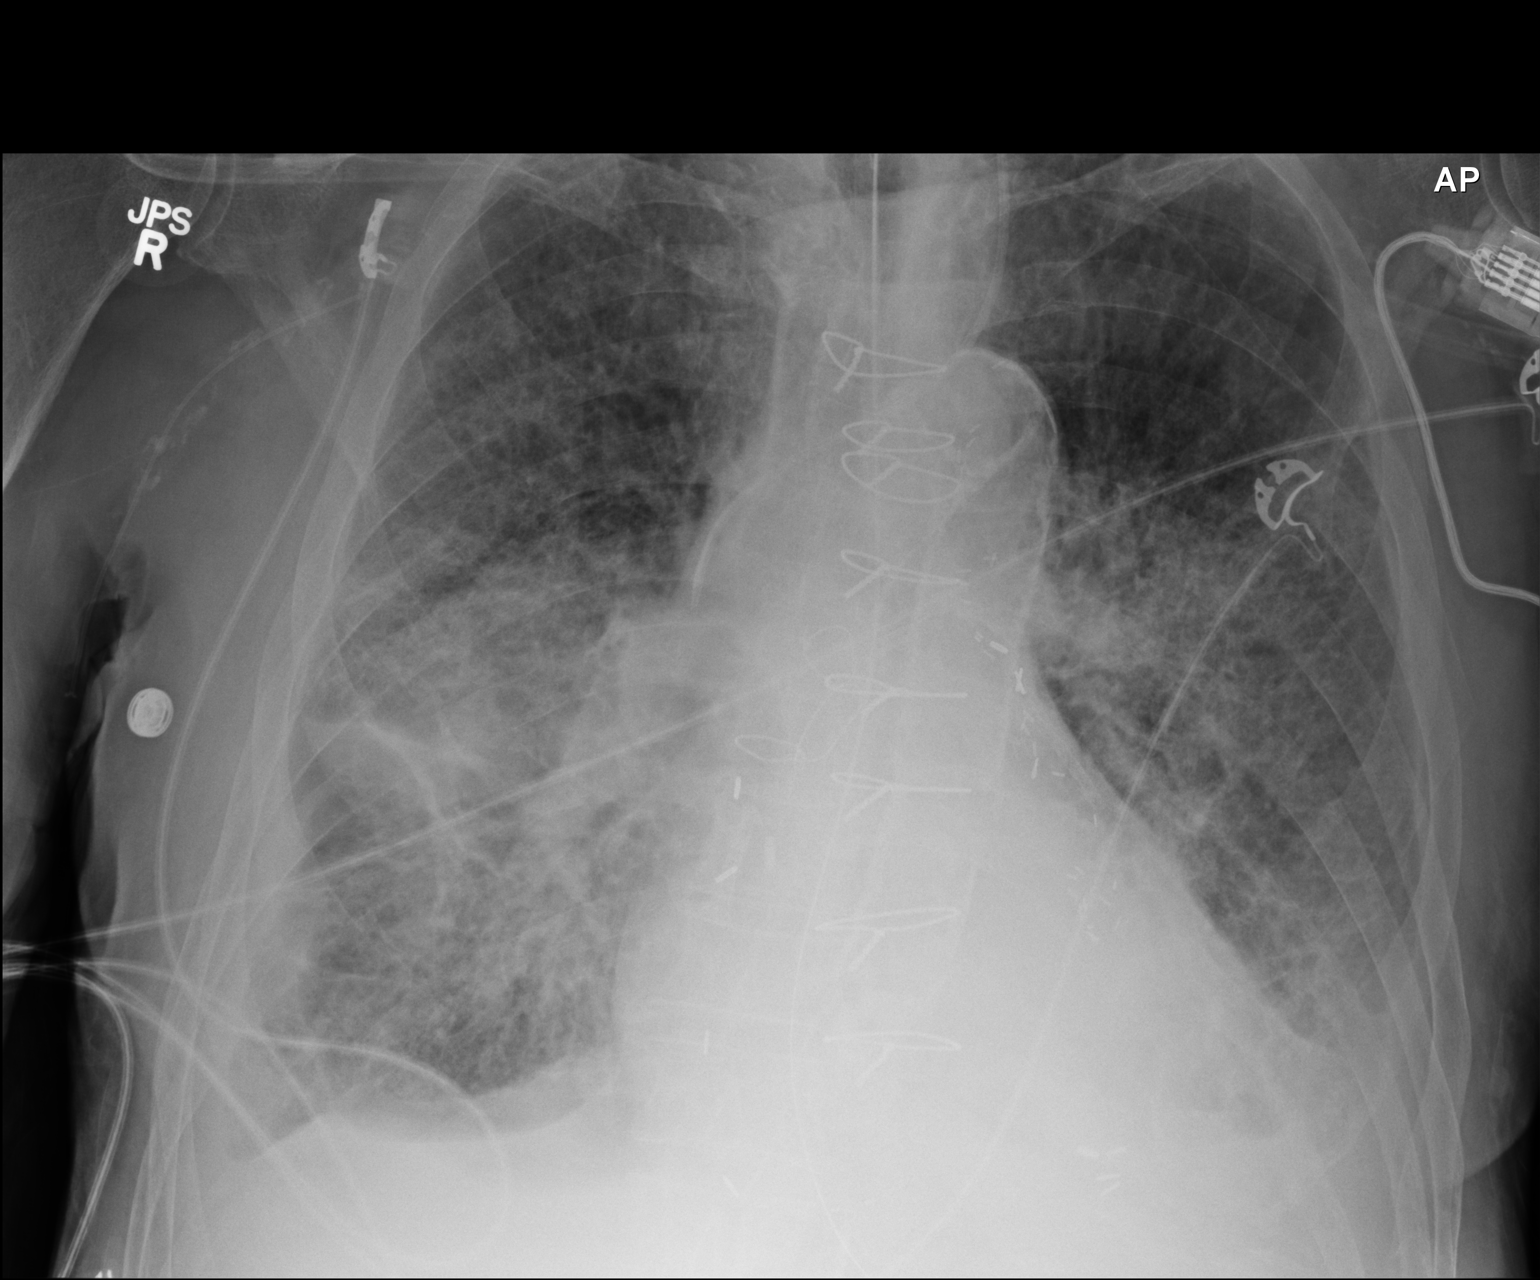

[1 of 1 positions shown; findings below may reference images not displayed]

FINDINGS: There is hyperinflation of the lungs compatible with COPD. Prior
CABG. Left central line and NG tube are unchanged. Mild
cardiomegaly. Bilateral airspace opacities and interstitial disease
again noted. Small bilateral effusions are stable. No significant
change since prior study.
IMPRESSION: COPD.

Stable bilateral interstitial and alveolar opacities, likely edema.

Stable small bilateral effusions.

## 2014-04-26 IMAGING — CR DG CHEST 1V PORT
2 series · 2 of 2 positions shown · non-contrast
Comparison: One-view chest 07/30/2013.

CLINICAL DATA: Respiratory failure.

EXAM:
PORTABLE CHEST - 1 VIEW

[AP (1 of 2)]
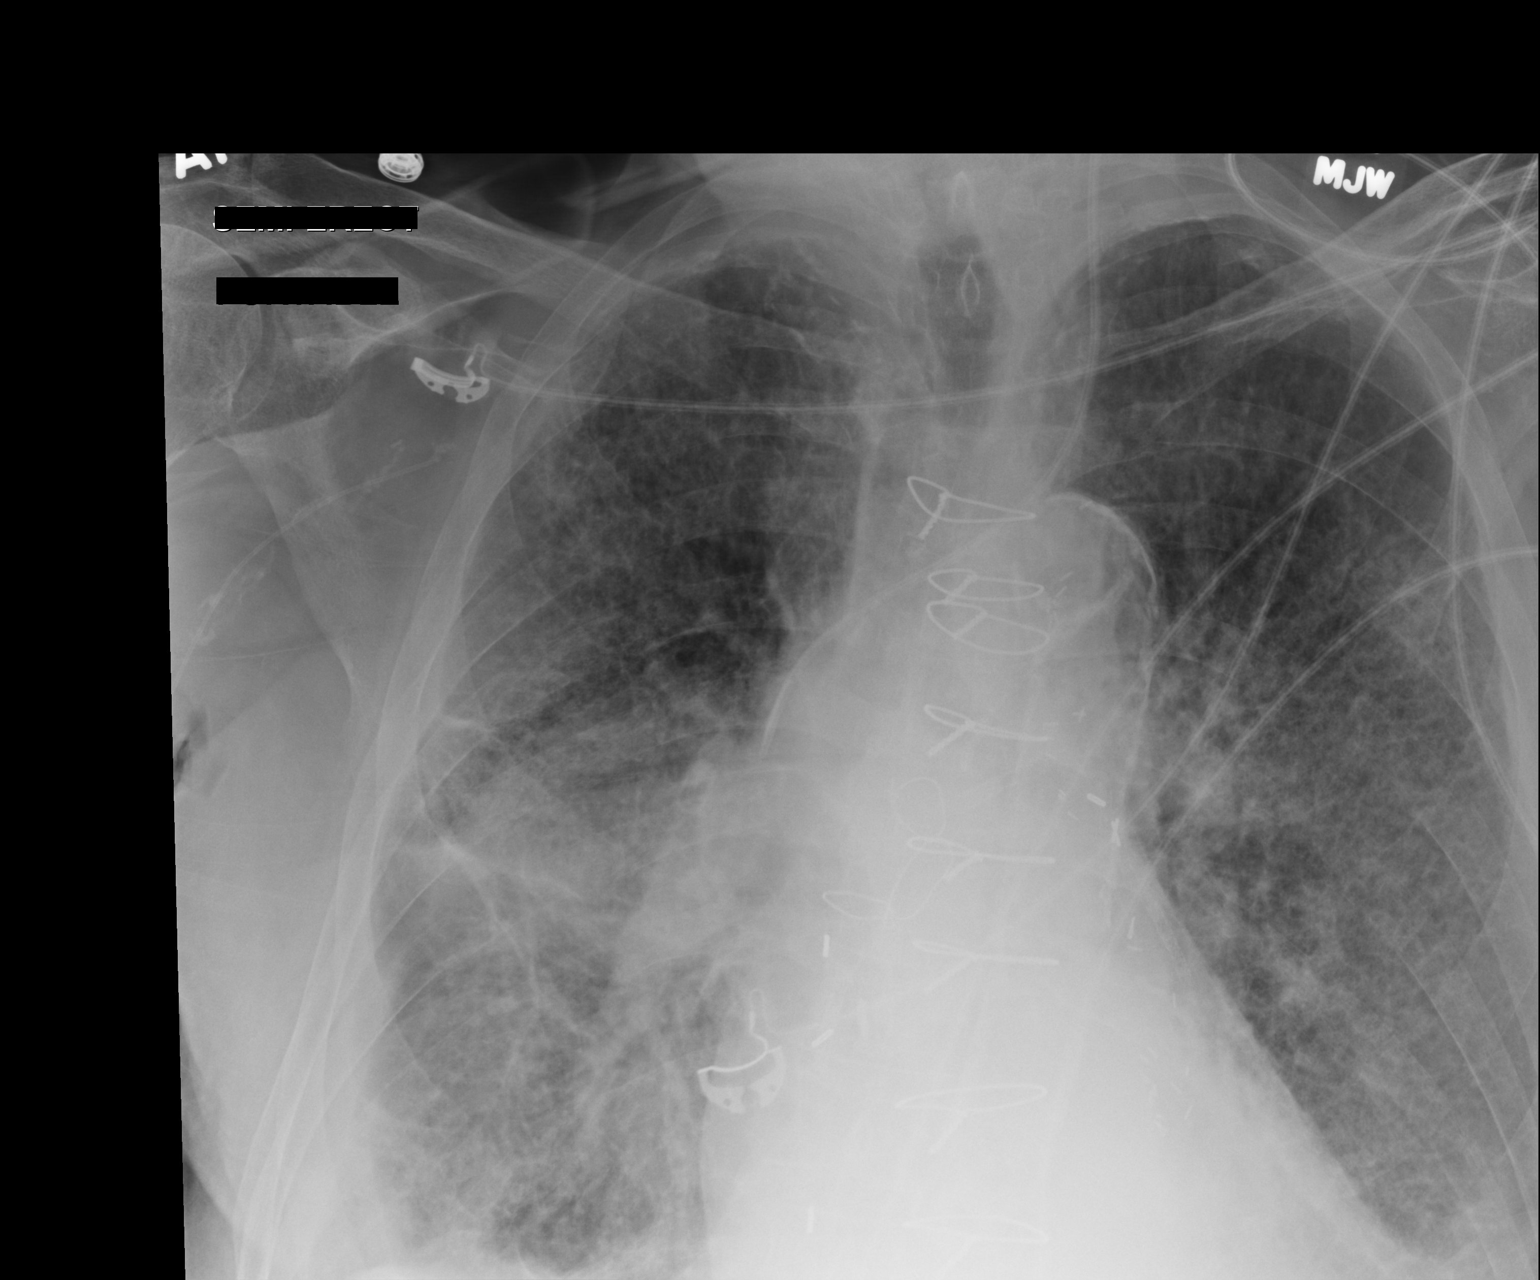

[AP (2 of 2)]
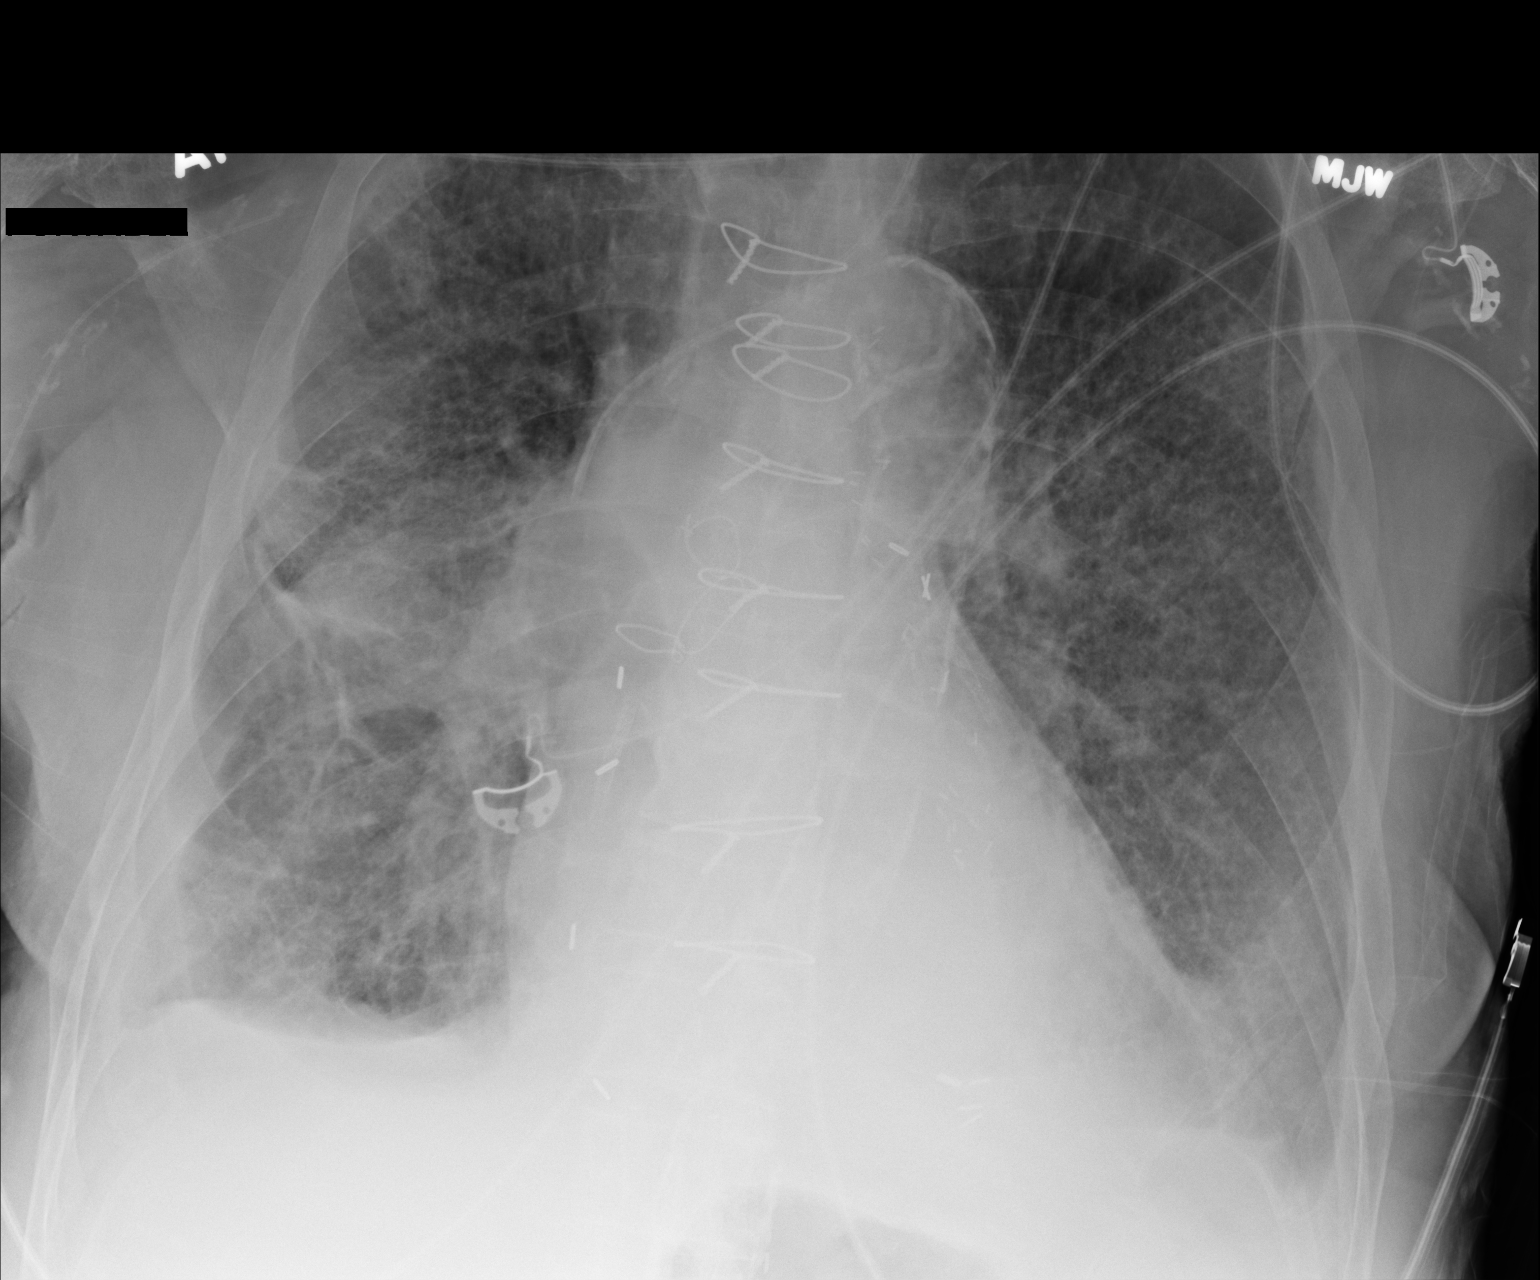

[2 of 2 positions shown; findings below may reference images not displayed]

FINDINGS: A left IJ line is stable in position. The patient has been
extubated. The heart is enlarged. Atherosclerotic calcifications are
again noted in the aortic arch. Bilateral pleural effusions and
associated airspace disease is present. A diffuse interstitial
pattern is unchanged. Airspace opacification at the level of the
minor fissure is slightly more prominent.
IMPRESSION: 1. Slight increase an airspace opacification over the right midlung,
likely in the superior segment of the right lower lobe.
2. Overall stable interstitial pattern, likely representing edema
superimposed on chronic coarsening.
3. Status post extubation.
4. Persistent bilateral pleural effusions as the associated airspace
disease, likely atelectasis.

## 2014-04-27 IMAGING — DX DG CHEST 1V PORT
1 series · 1 of 1 positions shown · non-contrast
Comparison: 07/31/2013

CLINICAL DATA: Respiratory failure, right lower extremity
amputation and bowel resection.

EXAM:
PORTABLE CHEST - 1 VIEW

[portable]
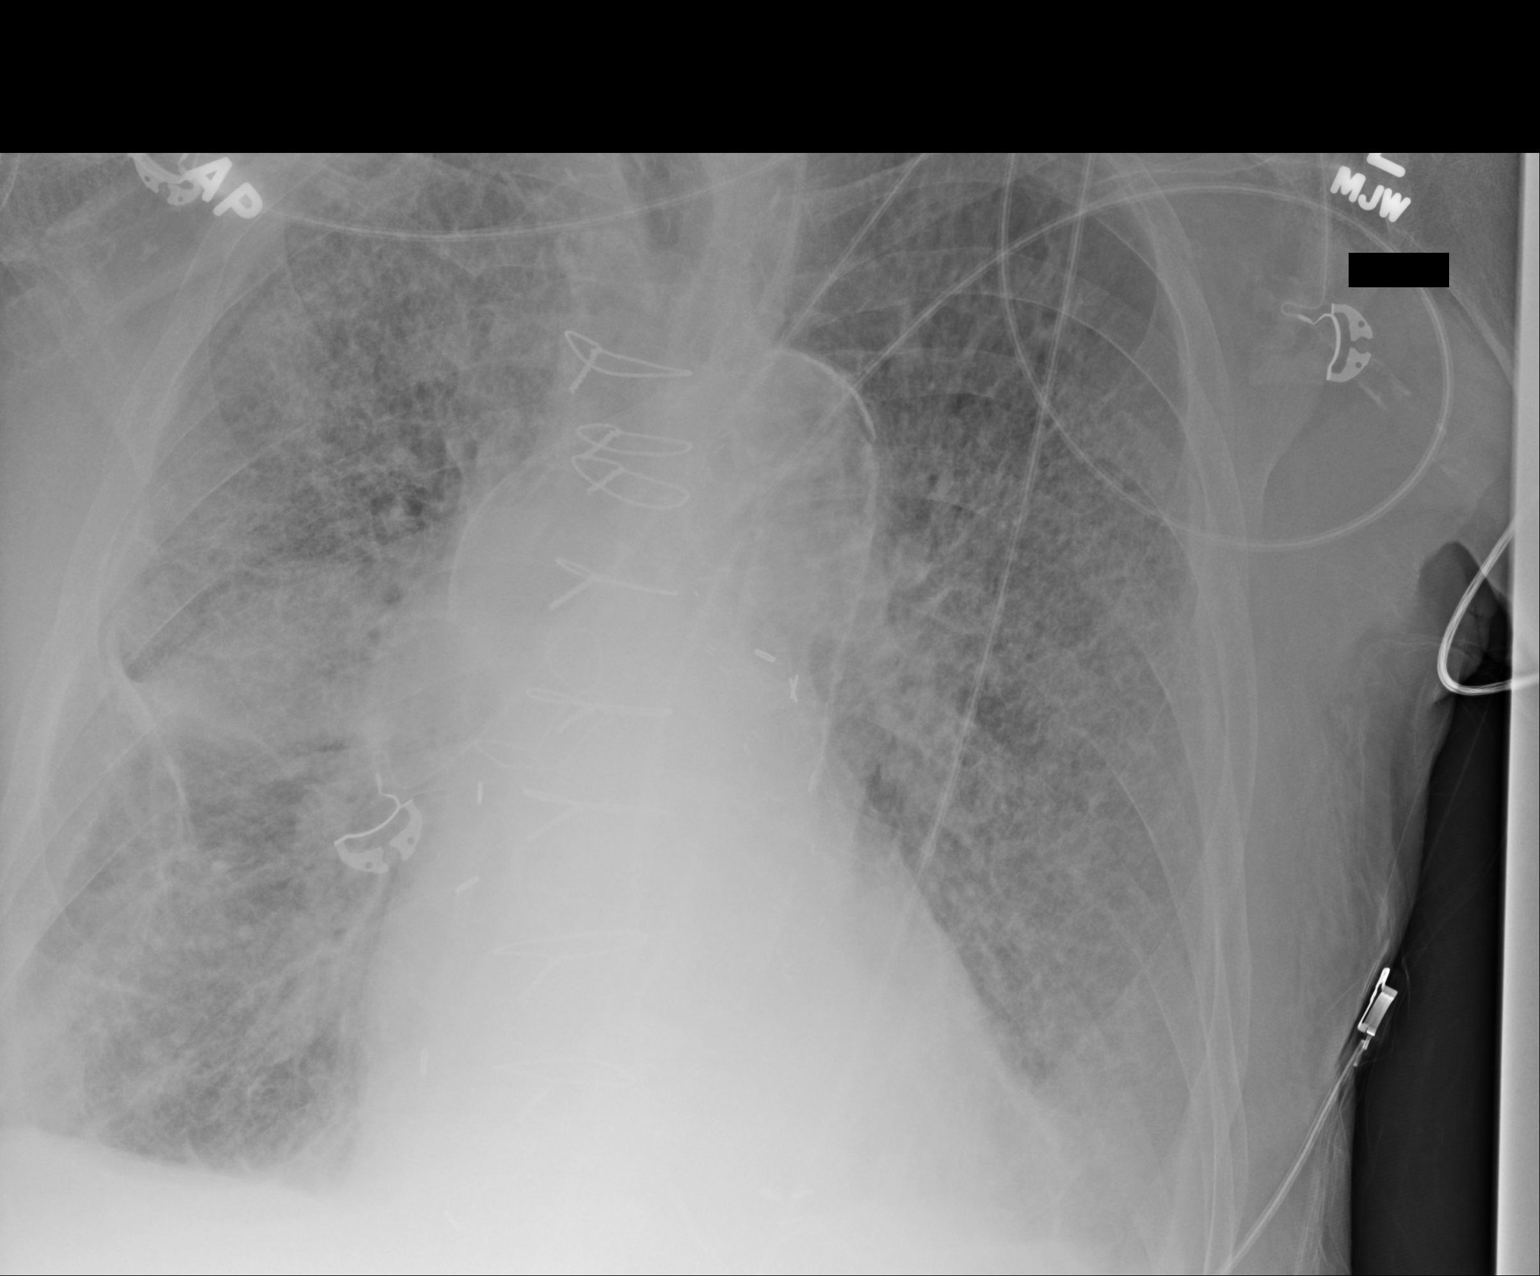

[1 of 1 positions shown; findings below may reference images not displayed]

FINDINGS: Overall worsening pattern of interstitial edema/ CHF. No
pneumothorax is identified. Central line positioning is stable.
IMPRESSION: Worsening interstitial edema/ CHF.

## 2014-05-02 IMAGING — CR DG CHEST 1V PORT
2 series · 2 of 2 positions shown · non-contrast
Comparison: 08/02/2013.

CLINICAL DATA: Pulmonary edema

EXAM:
PORTABLE CHEST - 1 VIEW

[AP (1 of 2)]
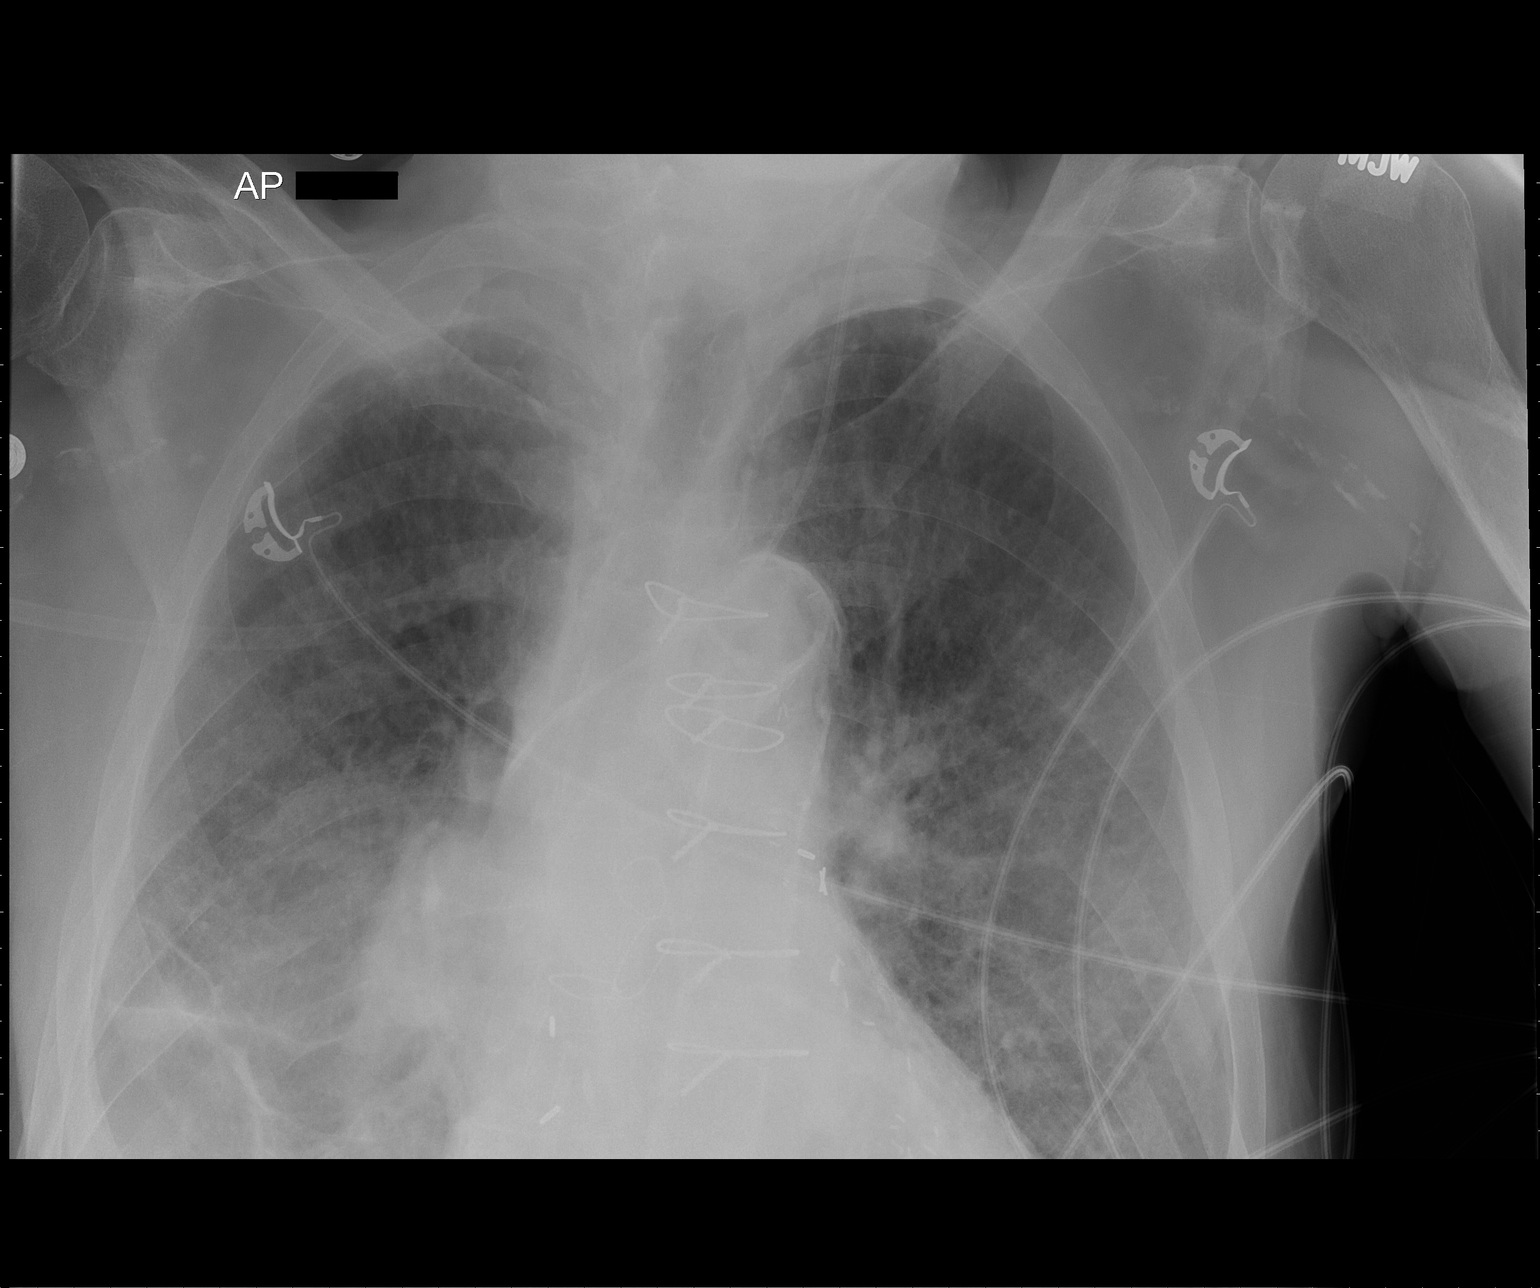

[AP (2 of 2)]
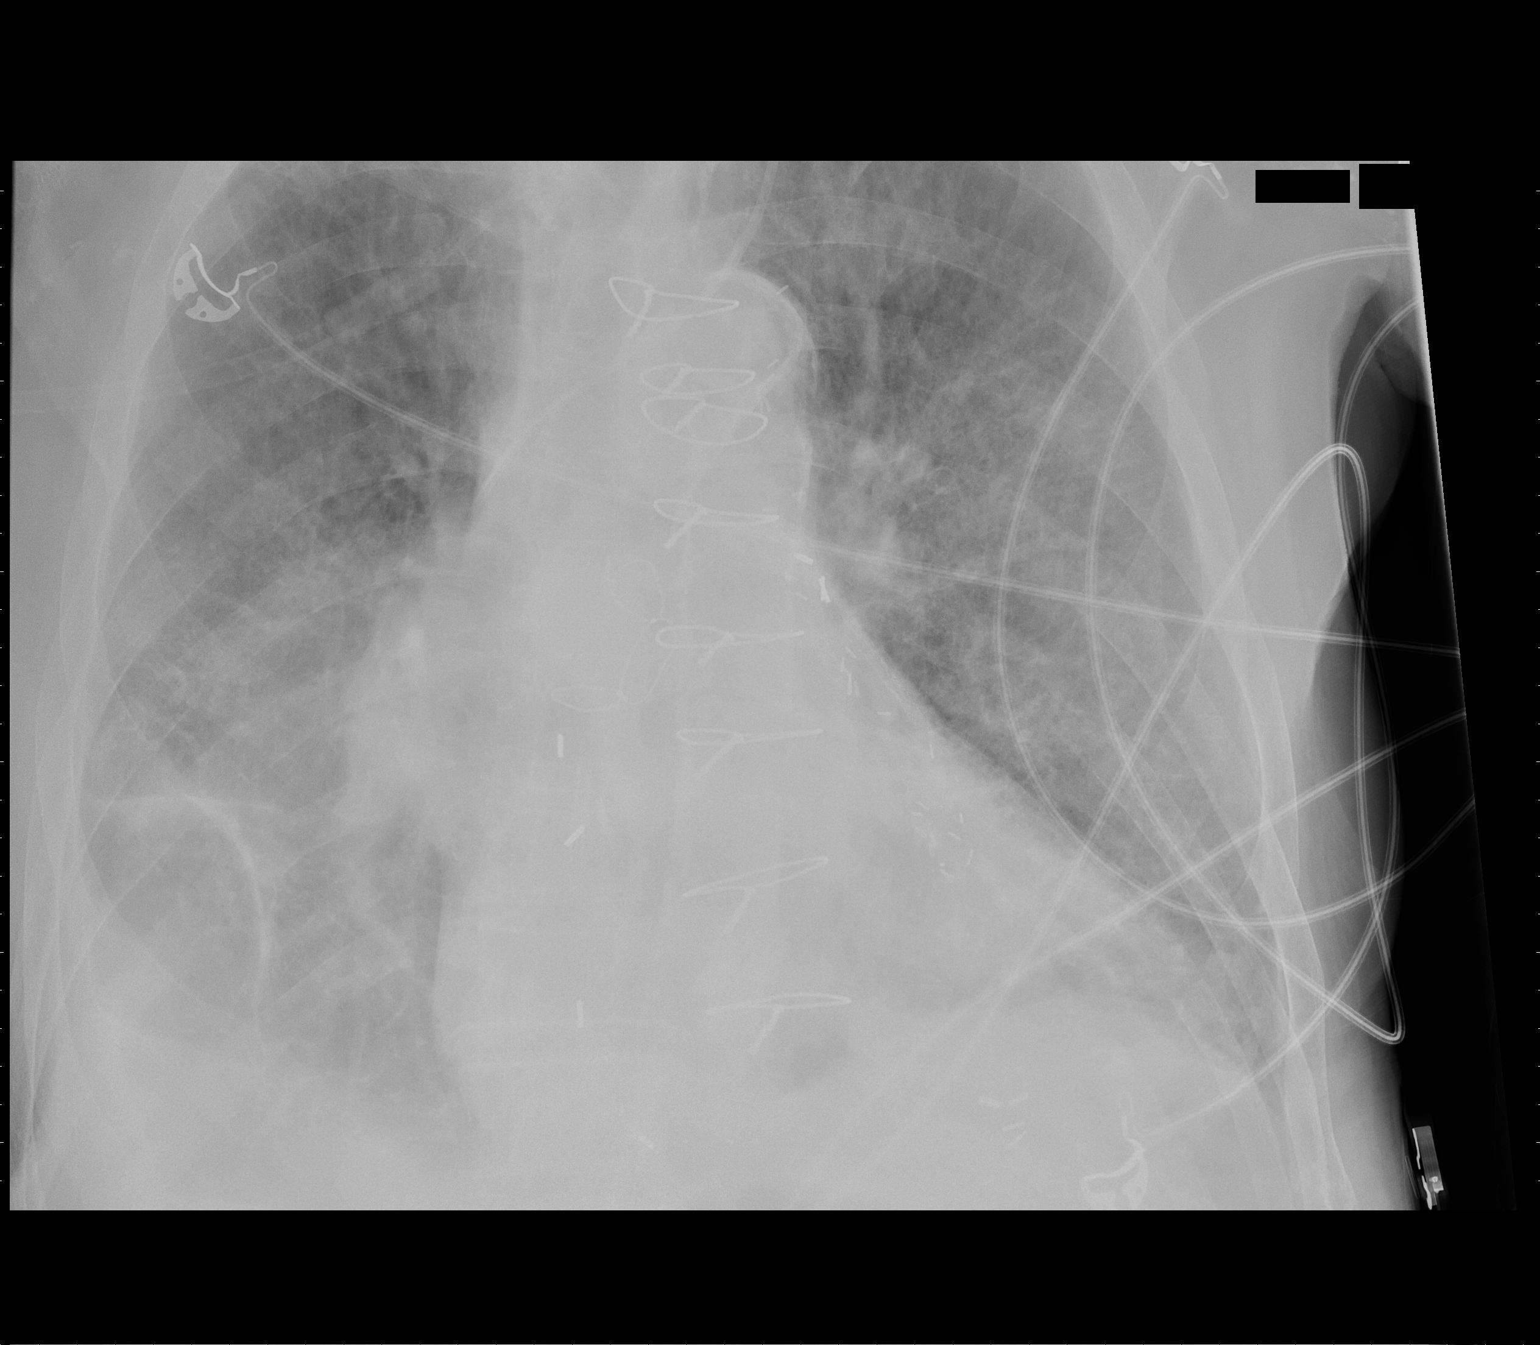

[2 of 2 positions shown; findings below may reference images not displayed]

FINDINGS: The cardiac silhouette is enlarged. And left internal jugular
central venous catheter is appreciated with tip projecting in the
region superior vena cava. Patient is status post median sternotomy
and coronary artery bypass grafting. Atherosclerotic calcifications
identified within the aorta. Diffuse bilateral pulmonary opacities
are appreciated with greatest confluence of the right lung base.
When compared to the previous study there has been decreased density
and conspicuity of the findings in the right lung base. There is a
consolidative component within the right lung base may represent a
component of a small effusion. The osseous structures unremarkable.
IMPRESSION: Slightly more prominent interstitial infiltrate again consistent
with pulmonary edema. Focal infiltrate versus atelectasis right lung
base is well small effusion. Continued surveillance evaluation
recommended. Support line unchanged.

## 2014-05-04 IMAGING — CT CT ABD-PELV W/ CM
2 of 5 series · 16 of 46 positions shown, 18 images · IV contrast (CONTRAST)
Comparison: CT abdomen/pelvis 07/21/2013

CLINICAL DATA: Nausea and vomiting.

EXAM:
CT ABDOMEN AND PELVIS WITH CONTRAST
TECHNIQUE: Multidetector CT imaging of the abdomen and pelvis was performed
using the standard protocol following bolus administration of
intravenous contrast.
CONTRAST:  100mL OMNIPAQUE IOHEXOL 300 MG/ML SOLN. Hand injection
was performed through central line without difficulty or
complication.

[Series 3: routine · axial · 0.82mm/px · z∈[-527,-77]mm · 13 of 102 slices shown, 15 images]
[im 6/102  soft-tissue]
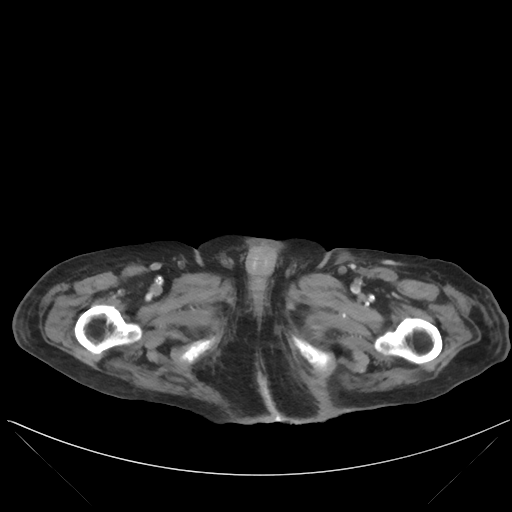
[im 6/102  bone]
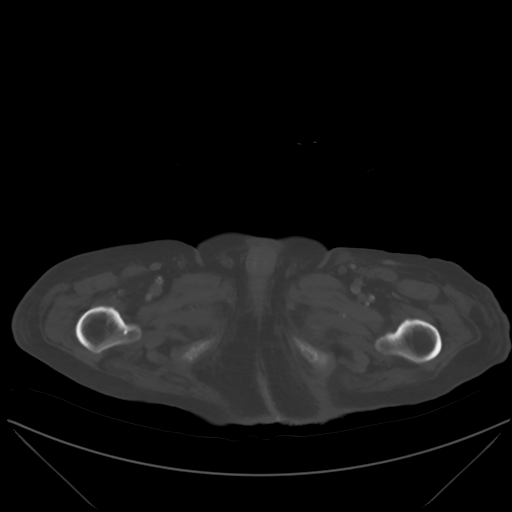
[im 12/102  soft-tissue]
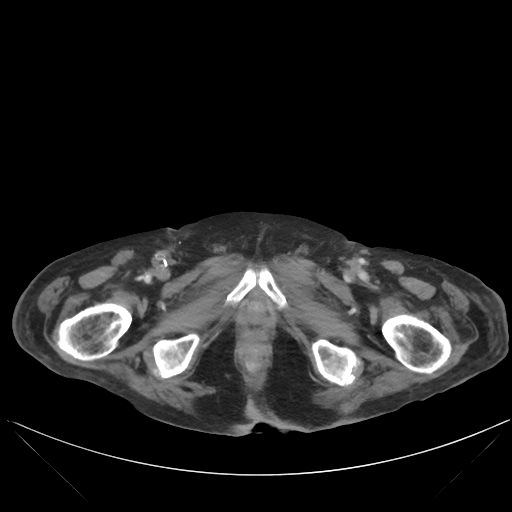
[im 23/102  soft-tissue]
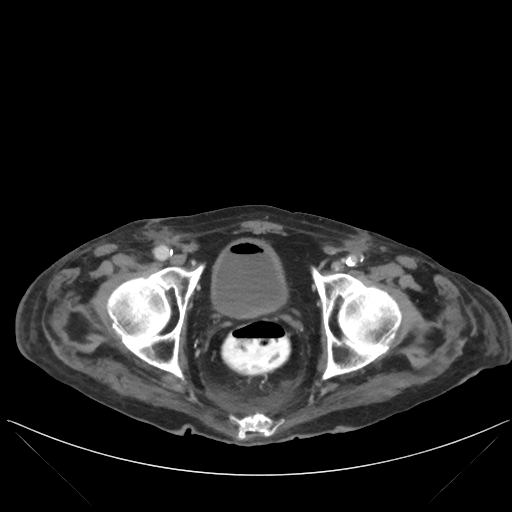
[im 29/102  soft-tissue]
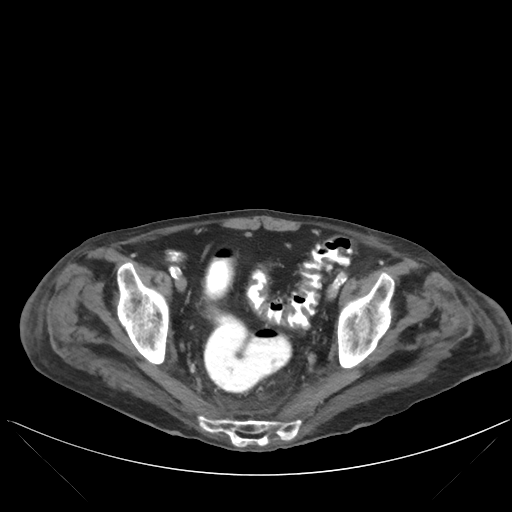
[im 34/102  soft-tissue]
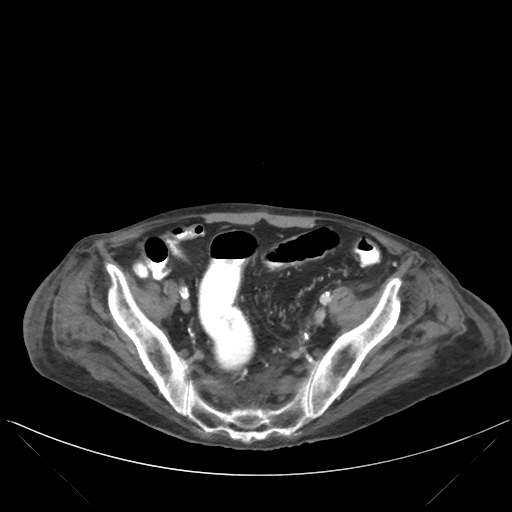
[im 45/102  soft-tissue]
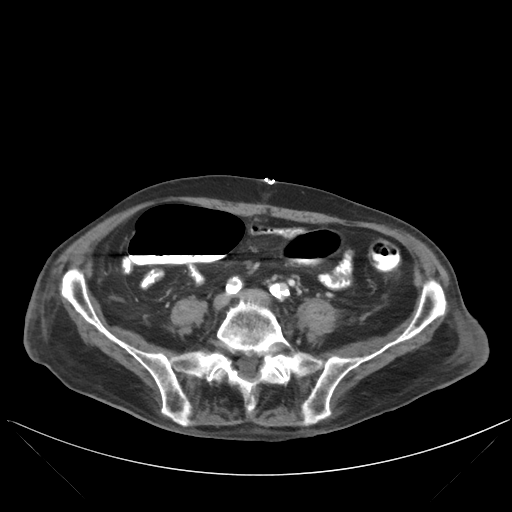
[im 51/102  soft-tissue]
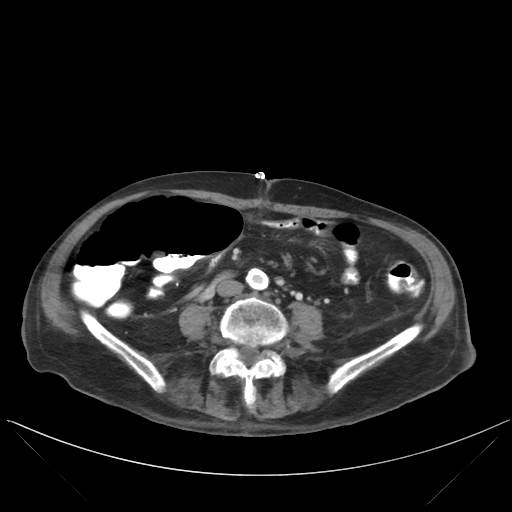
[im 57/102  soft-tissue]
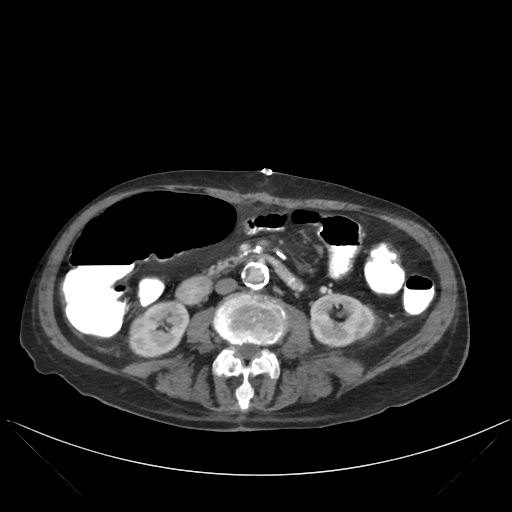
[im 68/102  soft-tissue]
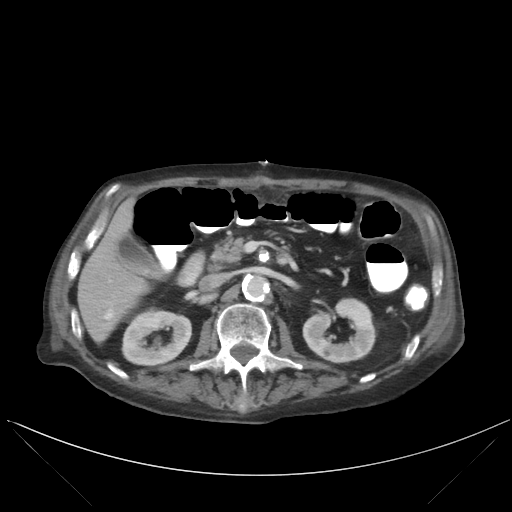
[im 68/102  bone]
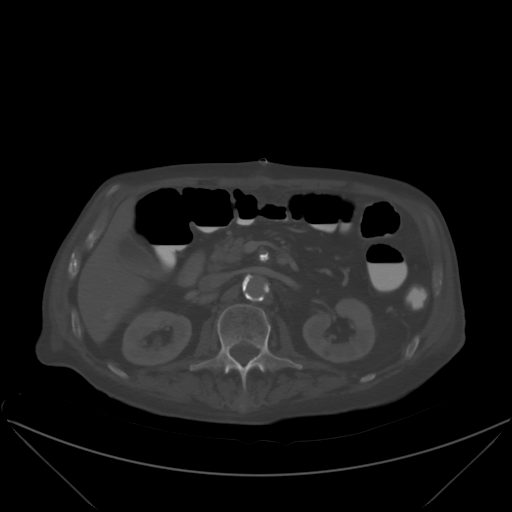
[im 73/102  soft-tissue]
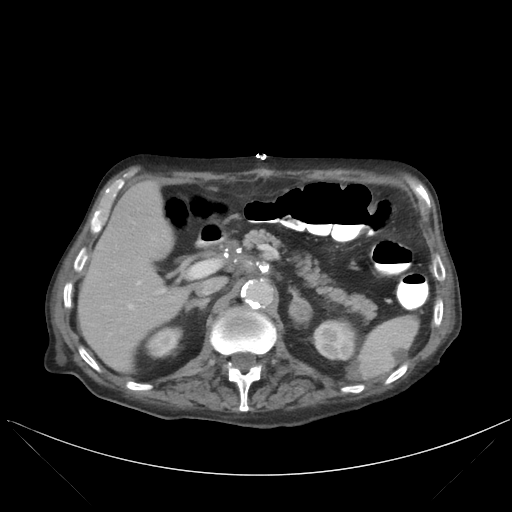
[im 79/102  soft-tissue]
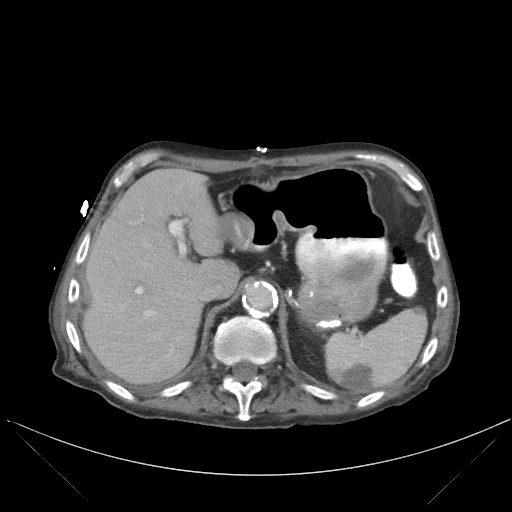
[im 90/102  soft-tissue]
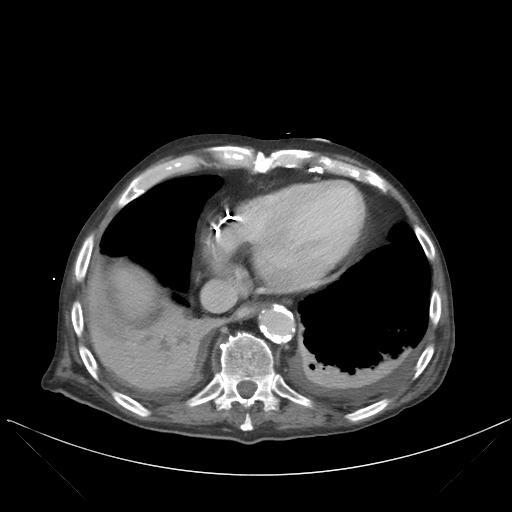
[im 96/102  soft-tissue]
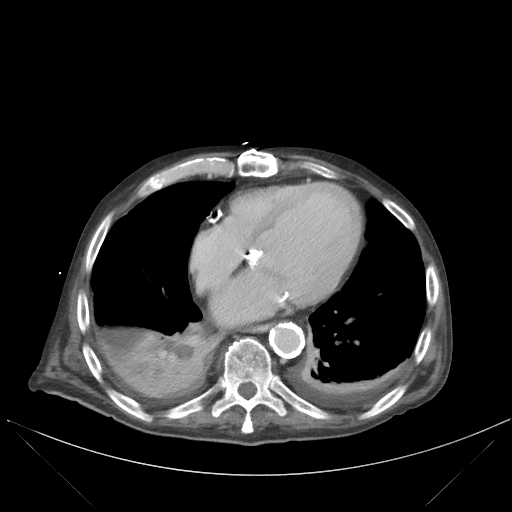

[mpr, coronals, coronal · coronal · 0.99mm/px · 3 of 101 slices shown]
[im 34/101  soft-tissue]
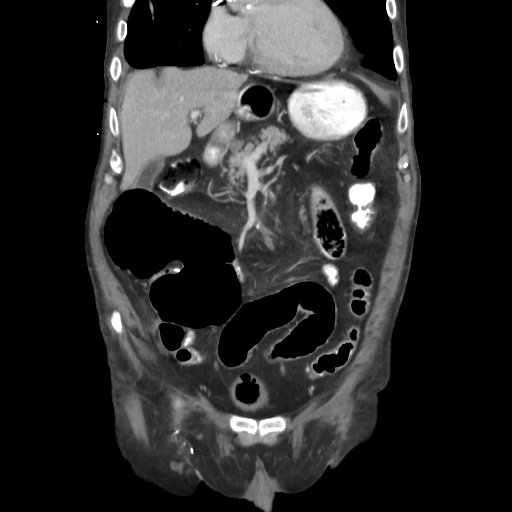
[im 45/101  soft-tissue]
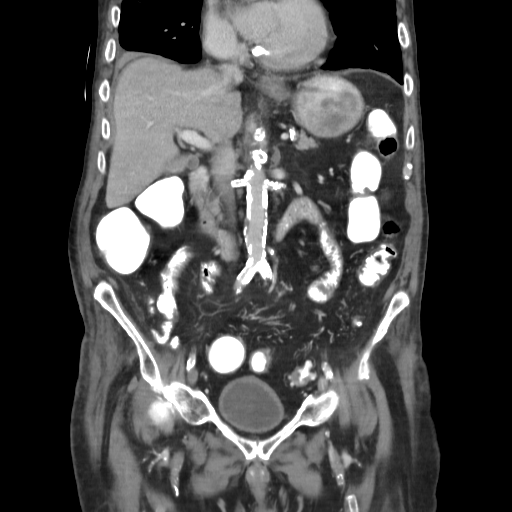
[im 56/101  soft-tissue]
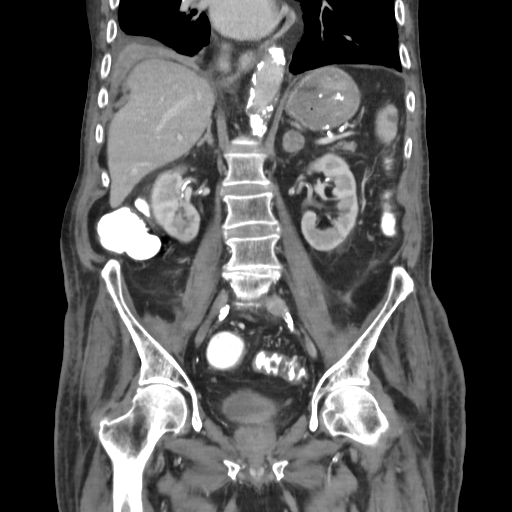

[16 of 46 positions shown; findings below may reference images not displayed]

FINDINGS: Increased bilateral small to moderate pleural effusions are noted
with associated compressive atelectasis. Portion of collapsed right
lower lobe is hypodense centrally which could indicate necrosis or
fluid/drowned lung. Heart size is mildly enlarged.

Wedge-shaped areas of peripheral splenic hypoenhancement are
reidentified, unchanged since the prior exam, which again could
represent infarcts or vascular perfusion phenomenon.

Layering sludge or stones noted within the gallbladder. Liver, right
adrenal gland, right kidney, and pancreas appear normal. 2.5 cm
mixed density left adrenal mass is stable in appearance. Dense
atheromatous calcification of the non aneurysmal abdominal aorta and
branch vessels again noted, with renal arterial stents in place.
Minimal infrarenal abdominal aortic ectasia is stable.

The patient has has interval midline abdominal wall incision
compatible with laparotomy. Trace pelvic fluid is identified.
Colonic diverticuli noted without evidence for diverticulitis.
Bladder wall thickness at upper limits of normal with internal
air-fluid level suggesting recent catheterization. Since the prior
exam, small bowel dilatation has resolved. No new area of bowel wall
thickening or focal segmental dilatation is identified.

No new acute suboptimal suspect no new acute osseous abnormality.
Lumbar spine degenerative change is present.
IMPRESSION: Interval resolution of previously seen small bowel dilatation after
apparent midline laparotomy. Trace pelvic fluid is likely
postoperative.

No new intra-abdominal or pelvic pathology.

Persistent apparent perfusion phenomenon in the spleen with
wedge-shaped hypoperfused regions which could indicate infarct,
unchanged.

Increased bilateral lower lobe consolidation, right greater than
left, with areas of hypodensity within the right lower lobe collapse
segment which could represent necrosis in the setting of pneumonia,
drowned lung, or aspiration of lipoid material given the internal
low-density.

## 2014-05-08 IMAGING — CT CT ABD-PELV W/ CM
2 of 6 series · 16 of 46 positions shown, 18 images · IV contrast (CONTRAST)
Comparison: 08/08/2013

CLINICAL DATA: Abdominal pain for small bowel resection.

EXAM:
CT ABDOMEN AND PELVIS WITH CONTRAST
TECHNIQUE: Multidetector CT imaging of the abdomen and pelvis was performed
using the standard protocol following bolus administration of
intravenous contrast.
CONTRAST:  80 cc Omnipaque 300 intravenous

[Series 2: routine · axial · 0.79mm/px · z∈[-522,-132]mm · 13 of 92 slices shown, 15 images]
[im 7/92  soft-tissue]
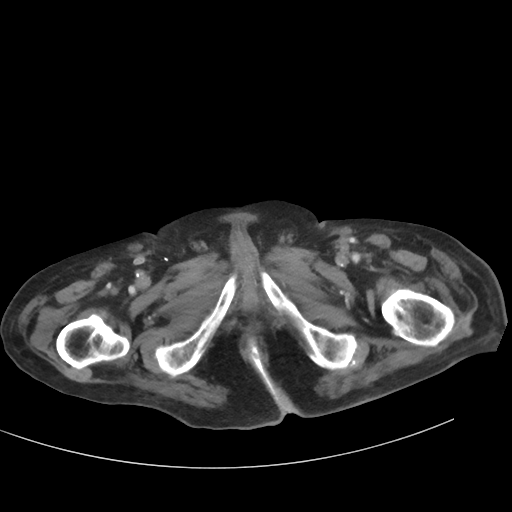
[im 7/92  bone]
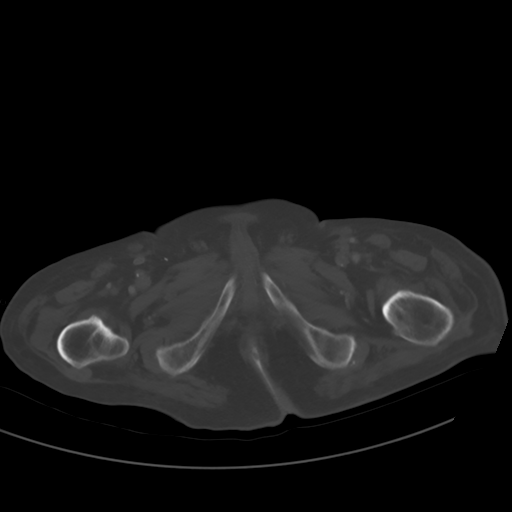
[im 13/92  soft-tissue]
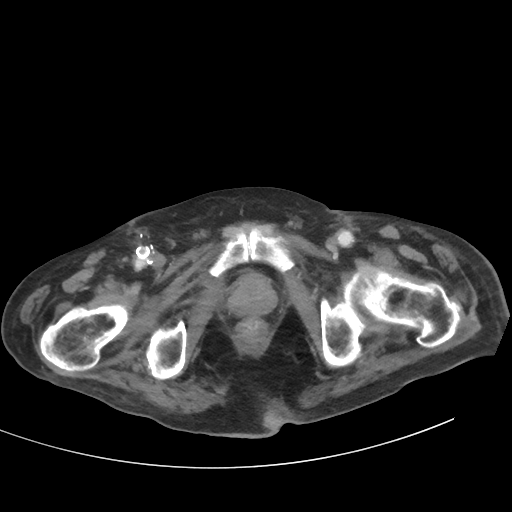
[im 19/92  soft-tissue]
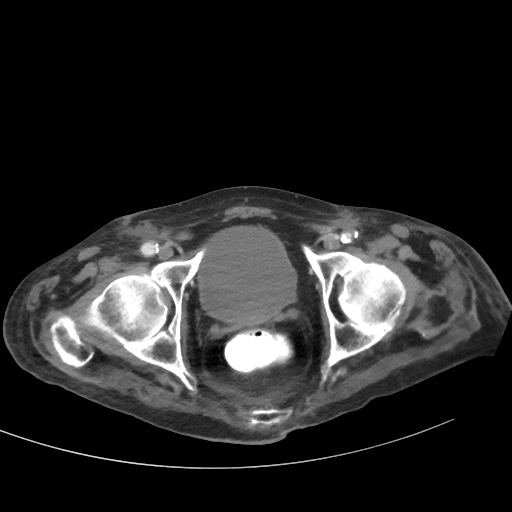
[im 25/92  soft-tissue]
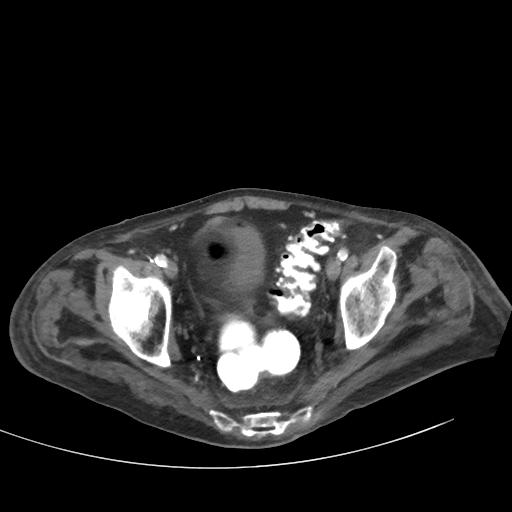
[im 31/92  soft-tissue]
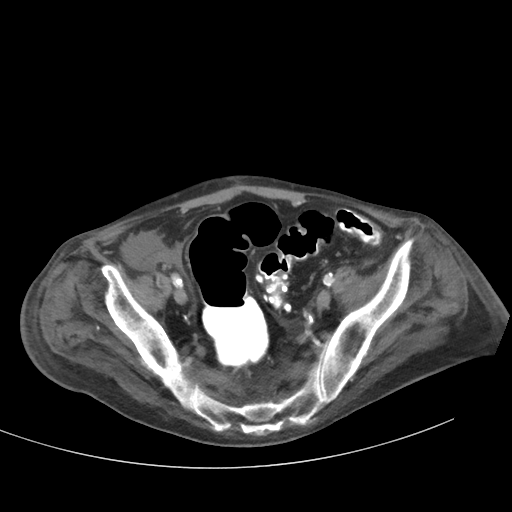
[im 37/92  soft-tissue]
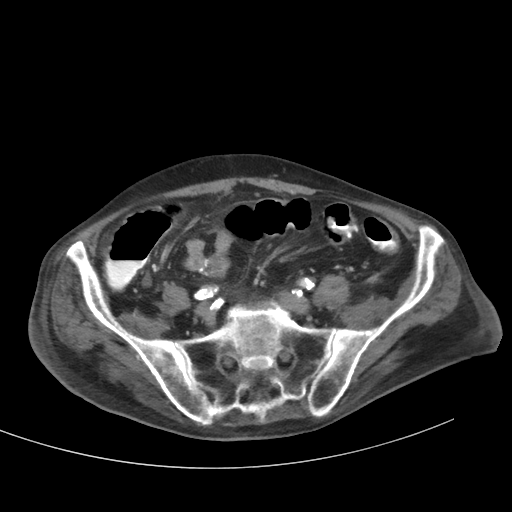
[im 49/92  soft-tissue]
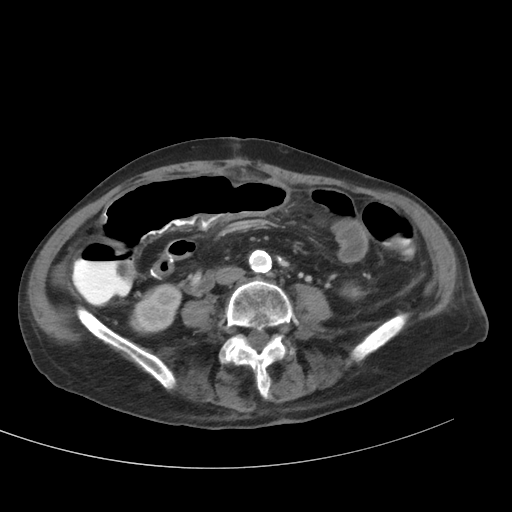
[im 55/92  soft-tissue]
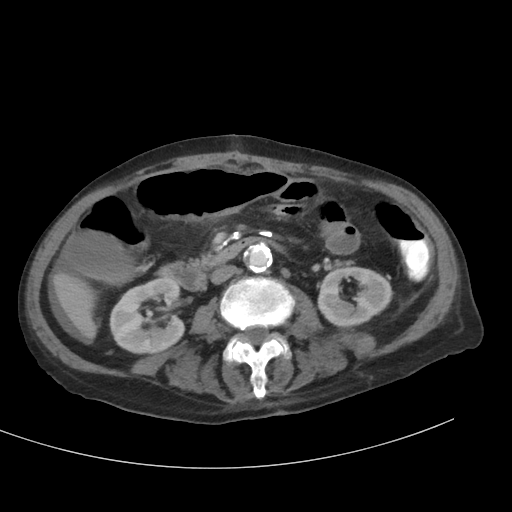
[im 61/92  soft-tissue]
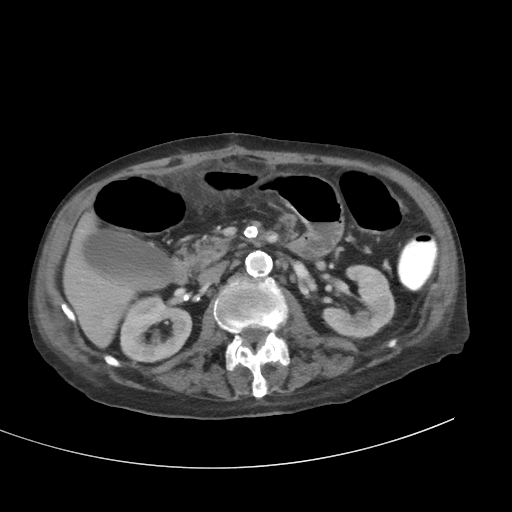
[im 61/92  bone]
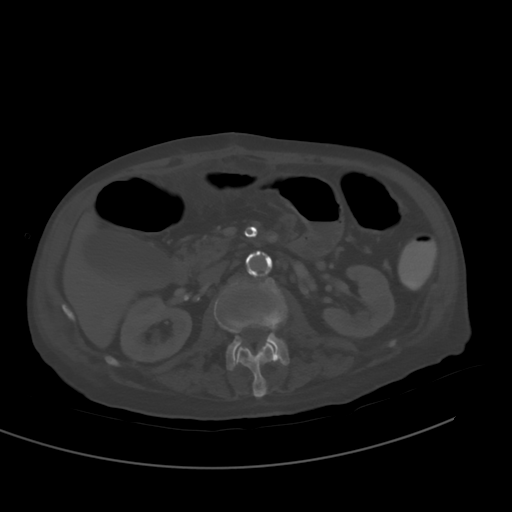
[im 67/92  soft-tissue]
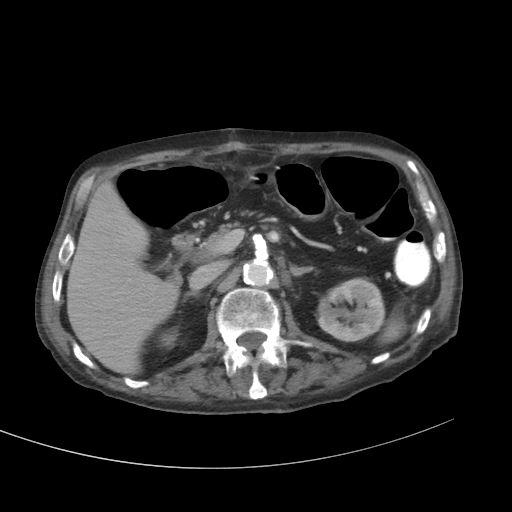
[im 73/92  soft-tissue]
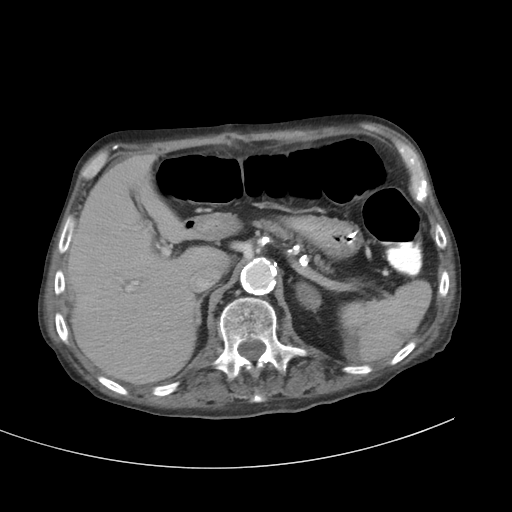
[im 79/92  soft-tissue]
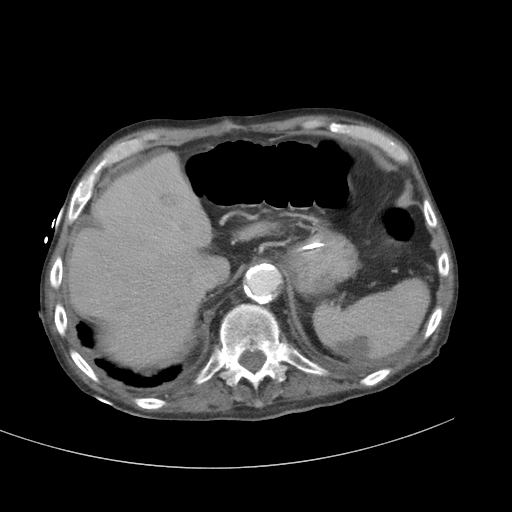
[im 85/92  soft-tissue]
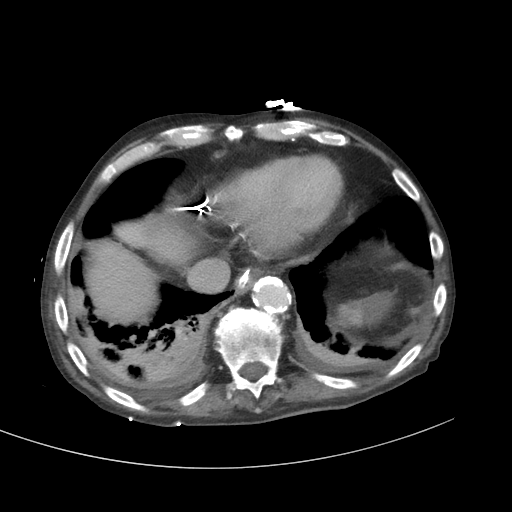

[Series 8040: mpr, coronals, coronal · coronal · 0.89mm/px · 3 of 97 slices shown]
[im 33/97  soft-tissue]
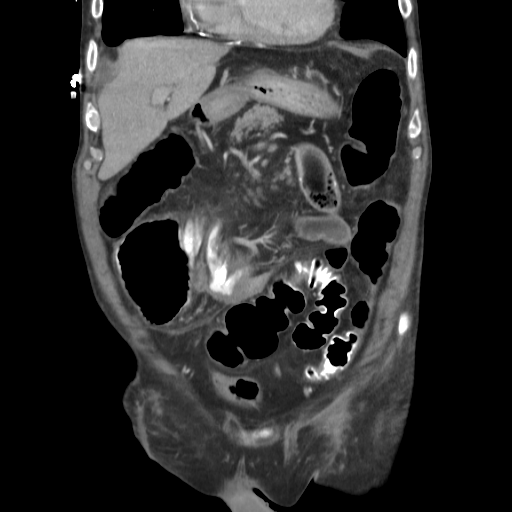
[im 43/97  soft-tissue]
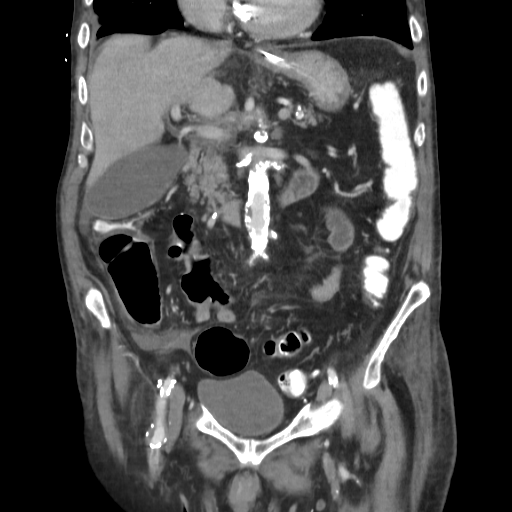
[im 54/97  soft-tissue]
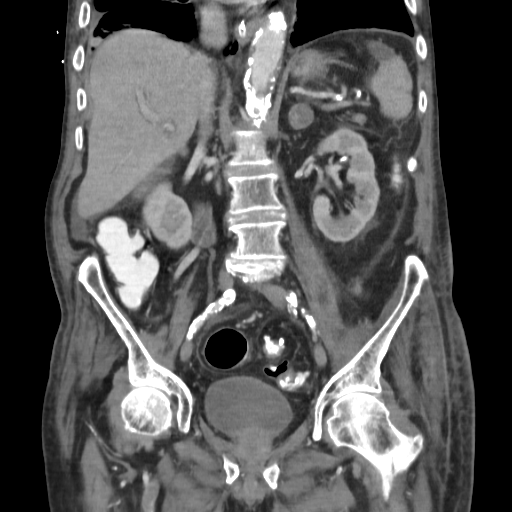

[16 of 46 positions shown; findings below may reference images not displayed]

FINDINGS: BODY WALL: No complicating factors related to recent midline
laparotomy incision.

LOWER CHEST: Coronary artery atherosclerosis. Bulky mitral annular
calcification. The left ventricular cavity appears dilated with
subendocardial low-attenuation which is chronic, likely from
previous infarct. Mildly decreased consolidative opacities in the
right with lower lobes. Small bilateral pleural effusions, with
unchanged mildly increased density on the right.

ABDOMEN/PELVIS:

Liver: No focal abnormality.

Biliary: Distended gallbladder with layering stones. There is right
upper quadrant ascites but no definitive pericholecystic fat
infiltration.

Pancreas: Atrophy without focal abnormality.

Spleen: Similar extent of wedge-shaped hypo enhancement in the
peripheral spleen compatible with previous infarctions in this
patient with high-grade celiac axis stenosis.

Adrenals: 2.5 cm heterogeneous mass in the left adrenal is
relatively stable since 9887, most consistent with adenoma on
previous non contrast imaging.

Kidneys and ureters: Bilateral renal hilar calcifications which are
likely atherosclerotic. No hydronephrosis. Low attenuating foci in
the renal cortex are simple or too small to characterize.

Bladder: Gas in the bladder.  No Foley catheter currently present.

Bowel: Multiple enteroenterostomy shows no evidence of leakage. No
abscess. As permitted by positive enteric contrast, No evidence of
non enhancing bowel wall. No pneumatosis. Colon is diffusely
distended, with fluid levels. Distal colonic diverticulosis.
Nasogastric tube in good position.

Retroperitoneum: No mass or adenopathy.

Peritoneum: No free fluid or gas.

Vascular: Extensive atherosclerosis. The degree of aortic branch
vessels stenoses is better established on previous CT angiography.
No definitive change from prior; proximal celiac and SMA stenosis is
severe. Continued occlusion of the right superficial femoral artery
and femoral graft. Probable left SFA occlusion. Surgical clips near
the right hepatic artery which is irregular in luminal contour.

OSSEOUS: No acute abnormalities.
IMPRESSION: 1. Distended colon suggesting ileus.
2. No evidence of leak or abscess status post recent small bowel
section.
3. Cholelithiasis with dilated gallbladder. Correlate right upper
quadrant symptoms. If concern for acute cholecystitis, recommend
sonography.
4. Pneumaturia. Correlate with urinalysis.
5. Partial clearing of lower lobe pneumonia.

## 2014-05-08 IMAGING — US US ABDOMEN LIMITED
1 series · 14 of 25 positions shown · non-contrast
Comparison: None.

CLINICAL DATA: Abdominal pain.

EXAM:
US ABDOMEN LIMITED - RIGHT UPPER QUADRANT

[Series 1: us abdomen limited · 0.22mm/px · 14 of 51 slices shown]
[im 1/51]
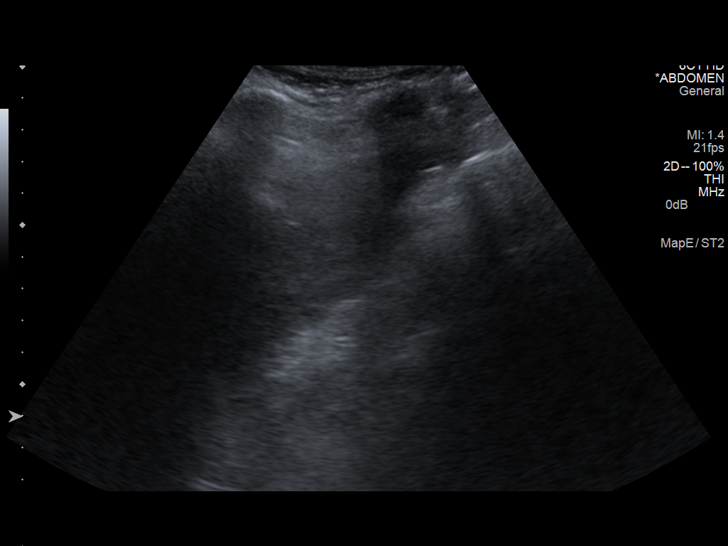
[im 5/51]
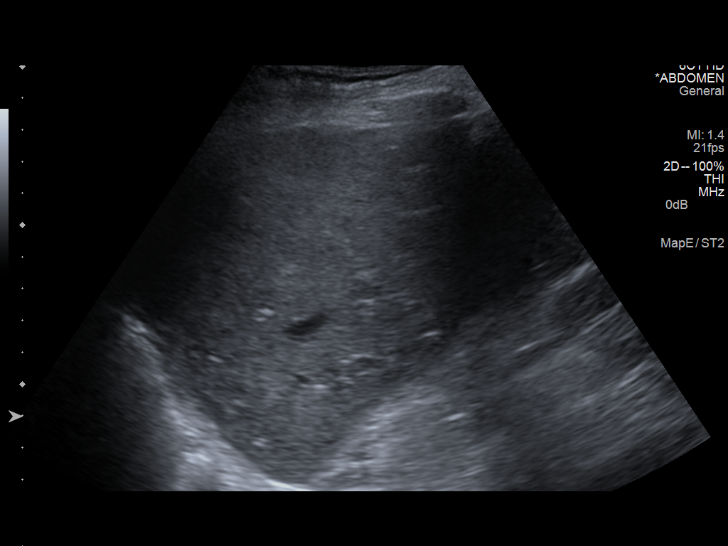
[im 9/51]
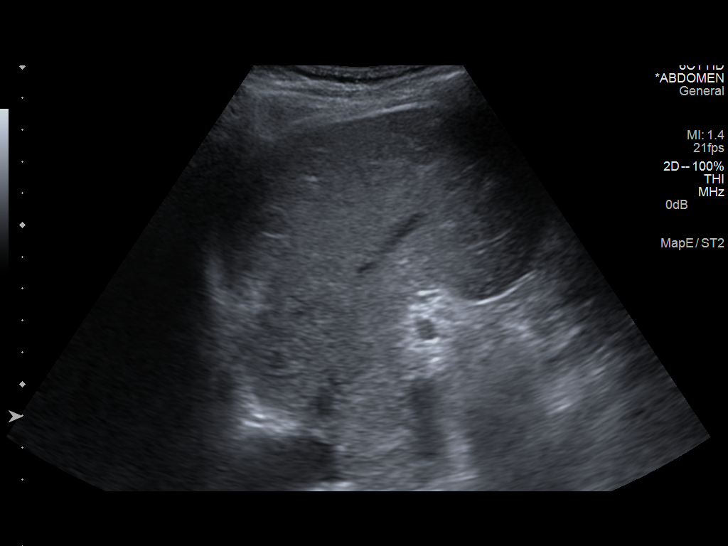
[im 13/51]
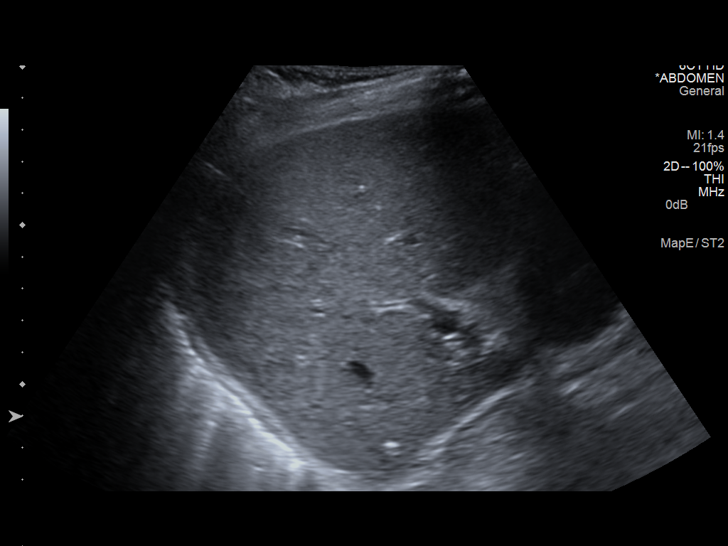
[im 17/51]
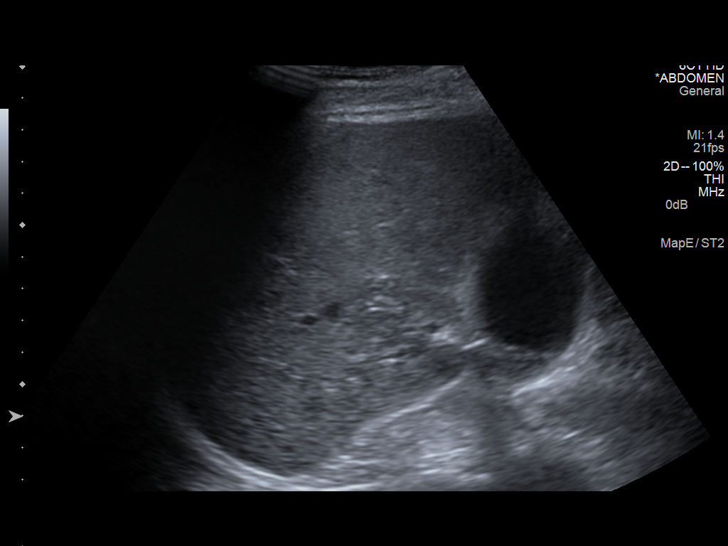
[im 19/51]
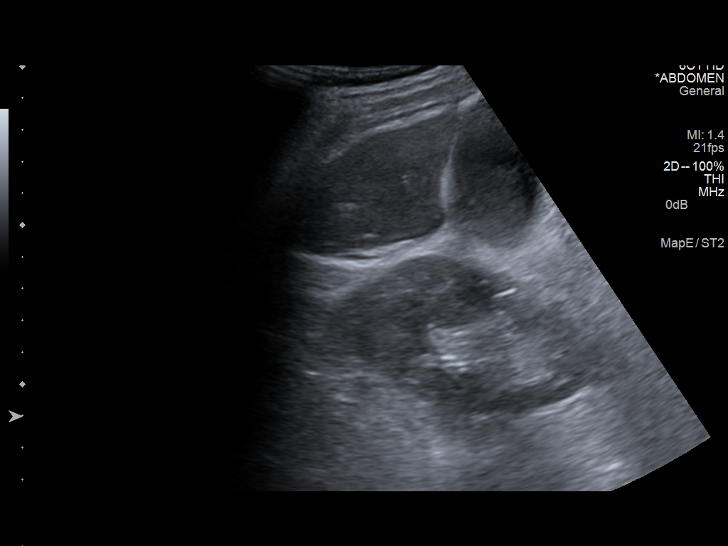
[im 23/51]
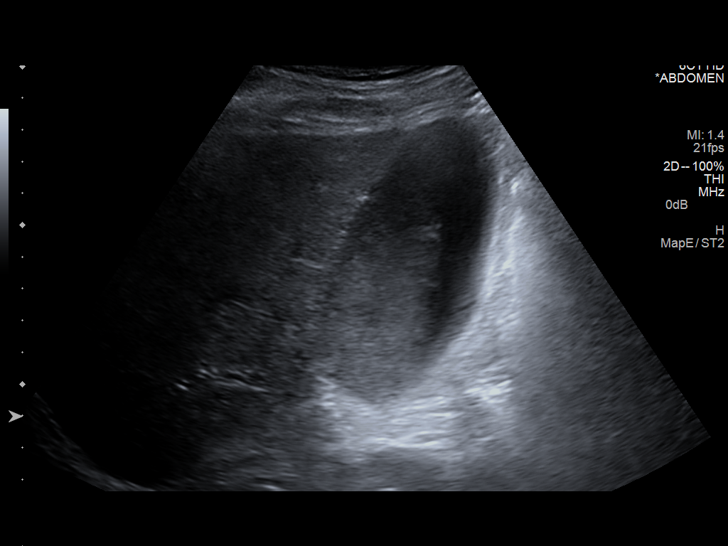
[im 28/51]
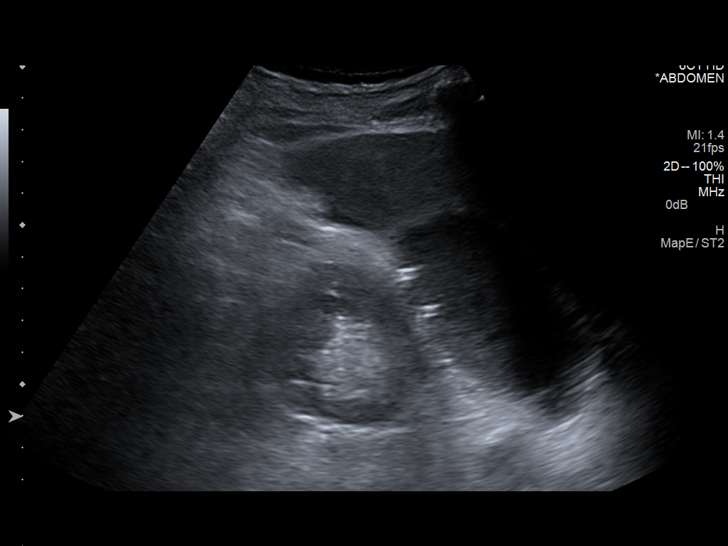
[im 32/51]
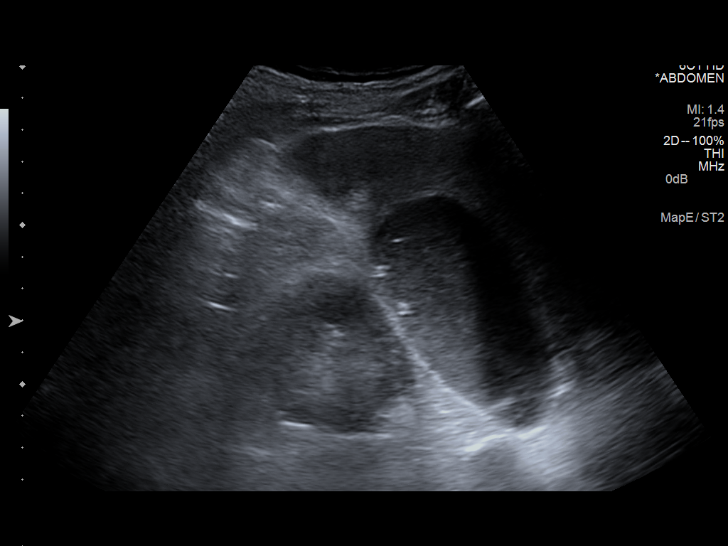
[im 34/51]
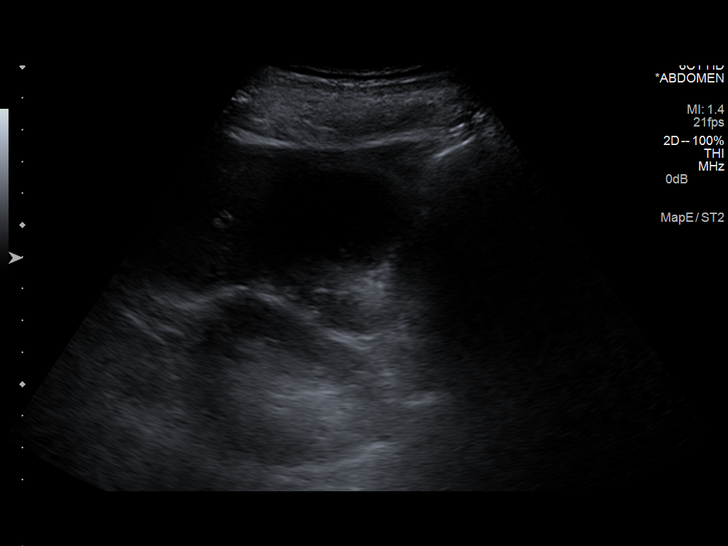
[im 38/51]
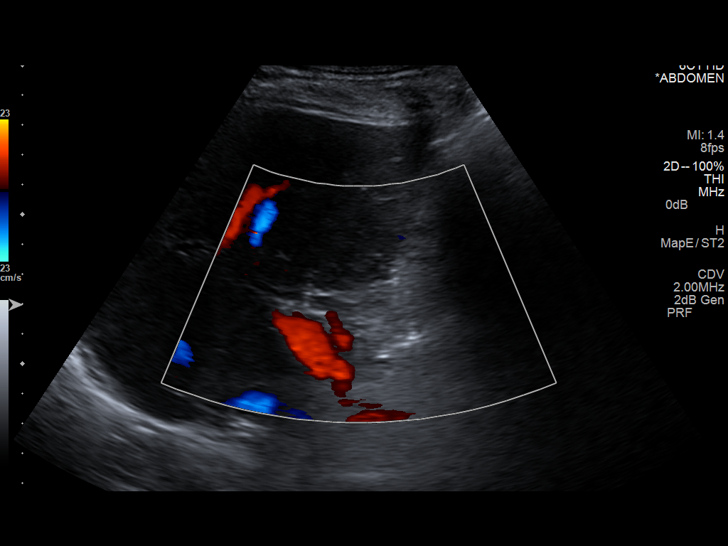
[im 42/51]
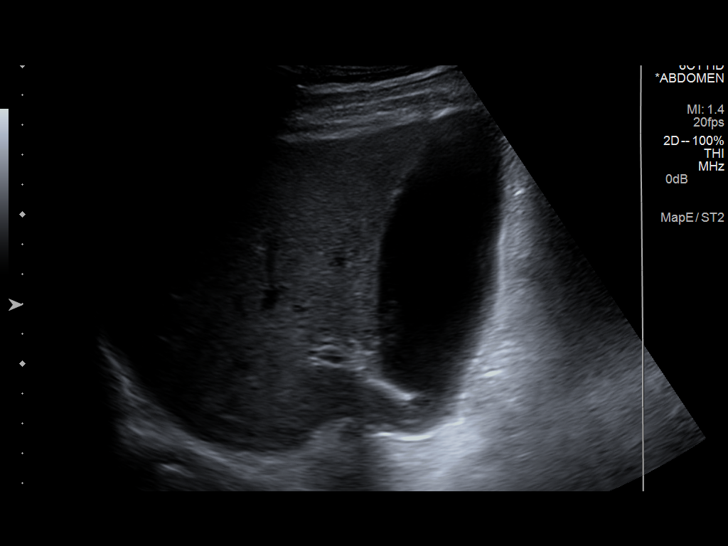
[im 46/51]
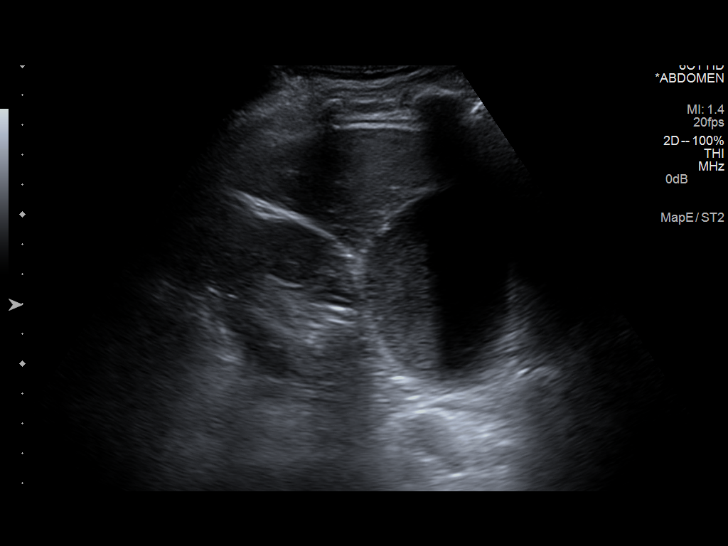
[im 51/51]
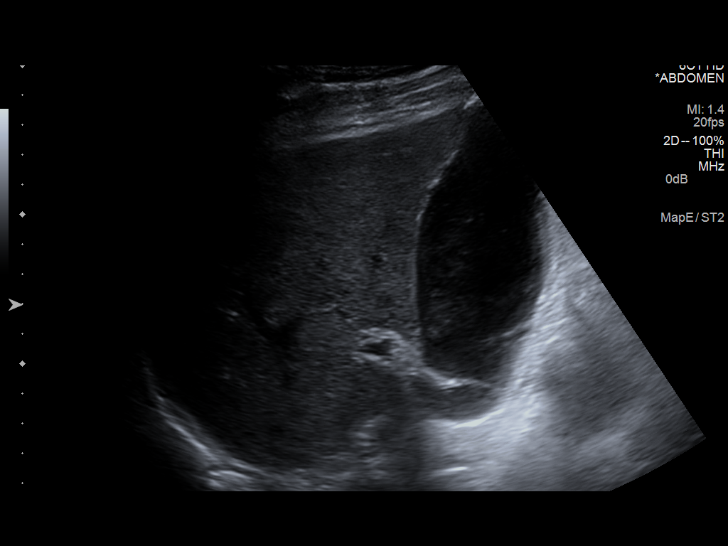

[14 of 25 positions shown; findings below may reference images not displayed]

FINDINGS: Gallbladder

The gallbladder is distended measuring 4.5 cm in diameter. The
gallbladder contains echogenic gallstones and sludge. There is no
evidence of gallbladder wall thickening at 2.2 mm, nor
pericholecystic fluid, nor a sonographic Murphy's sign.

Common bile duct

Diameter: 5.1 mm

Liver:

Limited evaluation the liver is grossly unremarkable. Evaluation is
degraded by bowel gas.
IMPRESSION: Distended gallbladder containing sludge and gallstones without
sonographic evidence of cholecystitis.

## 2014-07-23 ENCOUNTER — Encounter (HOSPITAL_COMMUNITY): Payer: Self-pay | Admitting: Cardiovascular Disease
